# Patient Record
Sex: Male | Born: 1945
Health system: Southern US, Community
[De-identification: ages and names within clinical notes are randomized; demographics above are authoritative.]

## PROBLEM LIST (undated history)

## (undated) DIAGNOSIS — I10 Essential (primary) hypertension: Secondary | ICD-10-CM

## (undated) DIAGNOSIS — I509 Heart failure, unspecified: Secondary | ICD-10-CM

## (undated) DIAGNOSIS — I440 Atrioventricular block, first degree: Secondary | ICD-10-CM

## (undated) DIAGNOSIS — Z950 Presence of cardiac pacemaker: Secondary | ICD-10-CM

## (undated) DIAGNOSIS — I4892 Unspecified atrial flutter: Secondary | ICD-10-CM

## (undated) DIAGNOSIS — N400 Enlarged prostate without lower urinary tract symptoms: Secondary | ICD-10-CM

## (undated) DIAGNOSIS — G8929 Other chronic pain: Secondary | ICD-10-CM

## (undated) DIAGNOSIS — M549 Dorsalgia, unspecified: Secondary | ICD-10-CM

## (undated) DIAGNOSIS — K922 Gastrointestinal hemorrhage, unspecified: Secondary | ICD-10-CM

## (undated) DIAGNOSIS — Z789 Other specified health status: Secondary | ICD-10-CM

## (undated) DIAGNOSIS — I38 Endocarditis, valve unspecified: Secondary | ICD-10-CM

## (undated) DIAGNOSIS — N183 Chronic kidney disease, stage 3 unspecified: Secondary | ICD-10-CM

## (undated) DIAGNOSIS — F1090 Alcohol use, unspecified, uncomplicated: Secondary | ICD-10-CM

## (undated) DIAGNOSIS — Z8719 Personal history of other diseases of the digestive system: Secondary | ICD-10-CM

## (undated) DIAGNOSIS — F1910 Other psychoactive substance abuse, uncomplicated: Secondary | ICD-10-CM

## (undated) DIAGNOSIS — I4891 Unspecified atrial fibrillation: Secondary | ICD-10-CM

## (undated) DIAGNOSIS — K219 Gastro-esophageal reflux disease without esophagitis: Secondary | ICD-10-CM

## (undated) DIAGNOSIS — I330 Acute and subacute infective endocarditis: Secondary | ICD-10-CM

## (undated) HISTORY — DX: Heart failure, unspecified: I50.9

## (undated) HISTORY — DX: Other chronic pain: G89.29

## (undated) HISTORY — DX: Gastro-esophageal reflux disease without esophagitis: K21.9

## (undated) HISTORY — PX: PACEMAKER INSERTION: SHX728

## (undated) HISTORY — DX: Unspecified atrial flutter: I48.92

## (undated) HISTORY — DX: Essential (primary) hypertension: I10

## (undated) HISTORY — DX: Dorsalgia, unspecified: M54.9

## (undated) HISTORY — DX: Acute and subacute infective endocarditis: I33.0

## (undated) HISTORY — DX: Atrioventricular block, first degree: I44.0

## (undated) HISTORY — DX: Unspecified atrial fibrillation: I48.91

## (undated) HISTORY — DX: Presence of cardiac pacemaker: Z95.0

## (undated) HISTORY — DX: Alcohol use, unspecified, uncomplicated: F10.90

## (undated) HISTORY — DX: Chronic kidney disease, stage 3 unspecified: N18.30

## (undated) HISTORY — DX: Other specified health status: Z78.9

## (undated) HISTORY — DX: Other psychoactive substance abuse, uncomplicated: F19.10

## (undated) HISTORY — DX: Personal history of other diseases of the digestive system: Z87.19

## (undated) HISTORY — DX: Chronic kidney disease, stage 3 (moderate): N18.3

## (undated) HISTORY — PX: CARDIAC VALVE REPLACEMENT: SHX585

---

## 1998-02-05 ENCOUNTER — Emergency Department (HOSPITAL_COMMUNITY): Admission: EM | Admit: 1998-02-05 | Discharge: 1998-02-05 | Payer: Self-pay | Admitting: Emergency Medicine

## 1998-03-01 ENCOUNTER — Emergency Department (HOSPITAL_COMMUNITY): Admission: EM | Admit: 1998-03-01 | Discharge: 1998-03-01 | Payer: Self-pay | Admitting: Emergency Medicine

## 2002-03-29 ENCOUNTER — Inpatient Hospital Stay (HOSPITAL_COMMUNITY): Admission: EM | Admit: 2002-03-29 | Discharge: 2002-03-29 | Payer: Self-pay | Admitting: *Deleted

## 2006-04-26 ENCOUNTER — Emergency Department (HOSPITAL_COMMUNITY): Admission: EM | Admit: 2006-04-26 | Discharge: 2006-04-26 | Payer: Self-pay | Admitting: Family Medicine

## 2007-06-03 ENCOUNTER — Emergency Department (HOSPITAL_COMMUNITY): Admission: EM | Admit: 2007-06-03 | Discharge: 2007-06-03 | Payer: Self-pay | Admitting: Emergency Medicine

## 2007-06-06 ENCOUNTER — Emergency Department (HOSPITAL_COMMUNITY): Admission: EM | Admit: 2007-06-06 | Discharge: 2007-06-06 | Payer: Self-pay | Admitting: Emergency Medicine

## 2007-06-12 ENCOUNTER — Emergency Department (HOSPITAL_COMMUNITY): Admission: EM | Admit: 2007-06-12 | Discharge: 2007-06-12 | Payer: Self-pay | Admitting: Emergency Medicine

## 2007-06-15 ENCOUNTER — Emergency Department (HOSPITAL_COMMUNITY): Admission: EM | Admit: 2007-06-15 | Discharge: 2007-06-15 | Payer: Self-pay | Admitting: Emergency Medicine

## 2007-09-06 ENCOUNTER — Emergency Department (HOSPITAL_COMMUNITY): Admission: EM | Admit: 2007-09-06 | Discharge: 2007-09-06 | Payer: Self-pay | Admitting: Family Medicine

## 2007-09-09 ENCOUNTER — Emergency Department (HOSPITAL_COMMUNITY): Admission: EM | Admit: 2007-09-09 | Discharge: 2007-09-09 | Payer: Self-pay | Admitting: Family Medicine

## 2007-09-30 ENCOUNTER — Inpatient Hospital Stay (HOSPITAL_COMMUNITY): Admission: EM | Admit: 2007-09-30 | Discharge: 2007-10-03 | Payer: Self-pay | Admitting: Emergency Medicine

## 2007-10-03 ENCOUNTER — Encounter (INDEPENDENT_AMBULATORY_CARE_PROVIDER_SITE_OTHER): Payer: Self-pay | Admitting: *Deleted

## 2007-10-07 ENCOUNTER — Ambulatory Visit: Payer: Self-pay | Admitting: Internal Medicine

## 2007-10-07 LAB — CONVERTED CEMR LAB
Basophils Relative: 1 % (ref 0–1)
Eosinophils Absolute: 0.1 10*3/uL (ref 0.0–0.7)
Eosinophils Relative: 1 % (ref 0–5)
Hemoglobin: 10 g/dL — ABNORMAL LOW (ref 13.0–17.0)
MCHC: 29.9 g/dL — ABNORMAL LOW (ref 30.0–36.0)
MCV: 82.9 fL (ref 78.0–100.0)
Monocytes Absolute: 0.7 10*3/uL (ref 0.1–1.0)
Monocytes Relative: 8 % (ref 3–12)
Neutrophils Relative %: 64 % (ref 43–77)
RBC: 4.03 M/uL — ABNORMAL LOW (ref 4.22–5.81)

## 2007-10-13 ENCOUNTER — Encounter (INDEPENDENT_AMBULATORY_CARE_PROVIDER_SITE_OTHER): Payer: Self-pay | Admitting: Internal Medicine

## 2007-10-13 LAB — CONVERTED CEMR LAB
Ferritin: 436 ng/mL — ABNORMAL HIGH (ref 22–322)
Saturation Ratios: 6 % — ABNORMAL LOW (ref 20–55)

## 2007-10-17 ENCOUNTER — Emergency Department (HOSPITAL_COMMUNITY): Admission: EM | Admit: 2007-10-17 | Discharge: 2007-10-17 | Payer: Self-pay | Admitting: Emergency Medicine

## 2007-11-01 ENCOUNTER — Ambulatory Visit: Payer: Self-pay | Admitting: Internal Medicine

## 2007-11-01 LAB — CONVERTED CEMR LAB
Hemoglobin: 8.7 g/dL — ABNORMAL LOW (ref 13.0–17.0)
Lymphocytes Relative: 22 % (ref 12–46)
Monocytes Absolute: 0.7 10*3/uL (ref 0.1–1.0)
Monocytes Relative: 7 % (ref 3–12)
Neutro Abs: 6.8 10*3/uL (ref 1.7–7.7)
RBC: 3.61 M/uL — ABNORMAL LOW (ref 4.22–5.81)
WBC: 9.8 10*3/uL (ref 4.0–10.5)

## 2007-11-02 ENCOUNTER — Ambulatory Visit (HOSPITAL_COMMUNITY): Admission: RE | Admit: 2007-11-02 | Discharge: 2007-11-02 | Payer: Self-pay | Admitting: Internal Medicine

## 2007-11-09 ENCOUNTER — Emergency Department (HOSPITAL_COMMUNITY): Admission: EM | Admit: 2007-11-09 | Discharge: 2007-11-09 | Payer: Self-pay | Admitting: Emergency Medicine

## 2007-11-11 ENCOUNTER — Ambulatory Visit: Payer: Self-pay | Admitting: Cardiology

## 2007-11-11 LAB — CONVERTED CEMR LAB
BUN: 11 mg/dL (ref 6–23)
Chloride: 96 meq/L (ref 96–112)
GFR calc non Af Amer: 80 mL/min
Glucose, Bld: 107 mg/dL — ABNORMAL HIGH (ref 70–99)
Potassium: 4 meq/L (ref 3.5–5.1)
Sodium: 134 meq/L — ABNORMAL LOW (ref 135–145)

## 2007-11-16 ENCOUNTER — Ambulatory Visit: Payer: Self-pay | Admitting: Internal Medicine

## 2007-11-16 ENCOUNTER — Ambulatory Visit (HOSPITAL_COMMUNITY): Admission: RE | Admit: 2007-11-16 | Discharge: 2007-11-16 | Payer: Self-pay | Admitting: Internal Medicine

## 2007-11-16 LAB — CONVERTED CEMR LAB
Albumin ELP: 44.3 % — ABNORMAL LOW (ref 55.8–66.1)
Alpha-1-Globulin: 9 % — ABNORMAL HIGH (ref 2.9–4.9)
Alpha-2-Globulin: 13.5 % — ABNORMAL HIGH (ref 7.1–11.8)
Basophils Relative: 0 % (ref 0–1)
Beta Globulin: 5.6 % (ref 4.7–7.2)
Hemoglobin: 9.5 g/dL — ABNORMAL LOW (ref 13.0–17.0)
Lymphs Abs: 2 10*3/uL (ref 0.7–4.0)
MCHC: 29.7 g/dL — ABNORMAL LOW (ref 30.0–36.0)
MCV: 81 fL (ref 78.0–100.0)
Monocytes Absolute: 0.9 10*3/uL (ref 0.1–1.0)
Monocytes Relative: 9 % (ref 3–12)
Neutro Abs: 6.1 10*3/uL (ref 1.7–7.7)
PSA: 1.21 ng/mL (ref 0.10–4.00)
RBC: 3.95 M/uL — ABNORMAL LOW (ref 4.22–5.81)
Total Protein, Serum Electrophoresis: 7.8 g/dL (ref 6.0–8.3)
WBC: 9.1 10*3/uL (ref 4.0–10.5)

## 2007-11-17 ENCOUNTER — Ambulatory Visit: Payer: Self-pay | Admitting: *Deleted

## 2007-11-18 ENCOUNTER — Encounter (INDEPENDENT_AMBULATORY_CARE_PROVIDER_SITE_OTHER): Payer: Self-pay | Admitting: Internal Medicine

## 2007-11-18 LAB — CONVERTED CEMR LAB
Albumin, U: DETECTED %
Free Kappa Lt Chains,Ur: 2.28 mg/dL — ABNORMAL HIGH (ref 0.04–1.51)
Volume, Urine: 1500 mL

## 2007-12-14 ENCOUNTER — Inpatient Hospital Stay (HOSPITAL_COMMUNITY): Admission: EM | Admit: 2007-12-14 | Discharge: 2008-01-24 | Payer: Self-pay | Admitting: Emergency Medicine

## 2007-12-14 ENCOUNTER — Encounter (INDEPENDENT_AMBULATORY_CARE_PROVIDER_SITE_OTHER): Payer: Self-pay | Admitting: Emergency Medicine

## 2007-12-14 ENCOUNTER — Ambulatory Visit: Payer: Self-pay | Admitting: Cardiology

## 2007-12-16 ENCOUNTER — Ambulatory Visit: Payer: Self-pay | Admitting: Cardiology

## 2007-12-16 ENCOUNTER — Ambulatory Visit: Payer: Self-pay | Admitting: Thoracic Surgery (Cardiothoracic Vascular Surgery)

## 2007-12-16 ENCOUNTER — Encounter (INDEPENDENT_AMBULATORY_CARE_PROVIDER_SITE_OTHER): Payer: Self-pay | Admitting: Internal Medicine

## 2007-12-16 ENCOUNTER — Encounter: Payer: Self-pay | Admitting: Thoracic Surgery (Cardiothoracic Vascular Surgery)

## 2007-12-17 ENCOUNTER — Ambulatory Visit: Payer: Self-pay | Admitting: Infectious Diseases

## 2007-12-17 ENCOUNTER — Encounter: Payer: Self-pay | Admitting: Internal Medicine

## 2007-12-19 ENCOUNTER — Encounter: Payer: Self-pay | Admitting: Thoracic Surgery (Cardiothoracic Vascular Surgery)

## 2007-12-19 ENCOUNTER — Encounter (INDEPENDENT_AMBULATORY_CARE_PROVIDER_SITE_OTHER): Payer: Self-pay | Admitting: Internal Medicine

## 2007-12-20 ENCOUNTER — Ambulatory Visit: Payer: Self-pay | Admitting: Dentistry

## 2007-12-26 ENCOUNTER — Ambulatory Visit: Payer: Self-pay | Admitting: Oncology

## 2007-12-27 ENCOUNTER — Encounter: Payer: Self-pay | Admitting: Thoracic Surgery (Cardiothoracic Vascular Surgery)

## 2008-01-11 ENCOUNTER — Encounter: Payer: Self-pay | Admitting: Thoracic Surgery (Cardiothoracic Vascular Surgery)

## 2008-01-16 ENCOUNTER — Ambulatory Visit: Payer: Self-pay | Admitting: Cardiothoracic Surgery

## 2008-02-01 ENCOUNTER — Ambulatory Visit: Payer: Self-pay | Admitting: Cardiology

## 2008-02-01 LAB — CONVERTED CEMR LAB
BUN: 45 mg/dL — ABNORMAL HIGH (ref 6–23)
CO2: 30 meq/L (ref 19–32)
Calcium: 9.7 mg/dL (ref 8.4–10.5)
Creatinine, Ser: 3.1 mg/dL — ABNORMAL HIGH (ref 0.4–1.5)
GFR calc Af Amer: 26 mL/min
Glucose, Bld: 92 mg/dL (ref 70–99)

## 2008-02-06 ENCOUNTER — Ambulatory Visit: Payer: Self-pay | Admitting: Internal Medicine

## 2008-02-06 LAB — CONVERTED CEMR LAB
BUN: 41 mg/dL — ABNORMAL HIGH (ref 6–23)
CO2: 30 meq/L (ref 19–32)
Calcium: 9.5 mg/dL (ref 8.4–10.5)
GFR calc Af Amer: 27 mL/min
Glucose, Bld: 116 mg/dL — ABNORMAL HIGH (ref 70–99)
Sodium: 133 meq/L — ABNORMAL LOW (ref 135–145)

## 2008-02-07 ENCOUNTER — Ambulatory Visit: Payer: Self-pay | Admitting: Cardiology

## 2008-02-10 ENCOUNTER — Inpatient Hospital Stay (HOSPITAL_COMMUNITY): Admission: EM | Admit: 2008-02-10 | Discharge: 2008-03-03 | Payer: Self-pay | Admitting: Emergency Medicine

## 2008-02-10 ENCOUNTER — Ambulatory Visit: Payer: Self-pay | Admitting: Cardiovascular Disease

## 2008-02-14 ENCOUNTER — Encounter: Payer: Self-pay | Admitting: Cardiovascular Disease

## 2008-02-22 ENCOUNTER — Ambulatory Visit: Payer: Self-pay | Admitting: Gastroenterology

## 2008-02-23 ENCOUNTER — Encounter: Payer: Self-pay | Admitting: Gastroenterology

## 2008-02-24 ENCOUNTER — Encounter: Payer: Self-pay | Admitting: Gastroenterology

## 2008-02-27 ENCOUNTER — Encounter: Payer: Self-pay | Admitting: Gastroenterology

## 2008-03-05 ENCOUNTER — Ambulatory Visit: Payer: Self-pay | Admitting: Internal Medicine

## 2008-03-05 LAB — CONVERTED CEMR LAB
Basophils Absolute: 0 10*3/uL (ref 0.0–0.1)
Basophils Relative: 1 % (ref 0–1)
CO2: 22 meq/L (ref 19–32)
Calcium: 10 mg/dL (ref 8.4–10.5)
Creatinine, Ser: 1.67 mg/dL — ABNORMAL HIGH (ref 0.40–1.50)
Eosinophils Absolute: 0.1 10*3/uL (ref 0.0–0.7)
Glucose, Bld: 90 mg/dL (ref 70–99)
INR: 1.3 (ref 0.0–1.5)
MCHC: 30.8 g/dL (ref 30.0–36.0)
MCV: 91.7 fL (ref 78.0–100.0)
Monocytes Absolute: 0.8 10*3/uL (ref 0.1–1.0)
Monocytes Relative: 10 % (ref 3–12)
Neutro Abs: 5.4 10*3/uL (ref 1.7–7.7)
Neutrophils Relative %: 66 % (ref 43–77)
RBC: 4.24 M/uL (ref 4.22–5.81)
RDW: 18.5 % — ABNORMAL HIGH (ref 11.5–15.5)
Sodium: 140 meq/L (ref 135–145)

## 2008-03-08 ENCOUNTER — Telehealth: Payer: Self-pay | Admitting: Gastroenterology

## 2008-03-08 ENCOUNTER — Ambulatory Visit: Payer: Self-pay | Admitting: Internal Medicine

## 2008-03-13 ENCOUNTER — Ambulatory Visit: Payer: Self-pay | Admitting: Gastroenterology

## 2008-03-13 DIAGNOSIS — Z860100 Personal history of colon polyps, unspecified: Secondary | ICD-10-CM | POA: Insufficient documentation

## 2008-03-13 DIAGNOSIS — Z8679 Personal history of other diseases of the circulatory system: Secondary | ICD-10-CM | POA: Insufficient documentation

## 2008-03-13 DIAGNOSIS — D649 Anemia, unspecified: Secondary | ICD-10-CM

## 2008-03-13 DIAGNOSIS — R0602 Shortness of breath: Secondary | ICD-10-CM

## 2008-03-13 DIAGNOSIS — R609 Edema, unspecified: Secondary | ICD-10-CM

## 2008-03-13 DIAGNOSIS — I38 Endocarditis, valve unspecified: Secondary | ICD-10-CM

## 2008-03-13 DIAGNOSIS — R42 Dizziness and giddiness: Secondary | ICD-10-CM

## 2008-03-13 DIAGNOSIS — I499 Cardiac arrhythmia, unspecified: Secondary | ICD-10-CM

## 2008-03-13 HISTORY — DX: Personal history of other diseases of the circulatory system: Z86.79

## 2008-03-14 LAB — CONVERTED CEMR LAB
Eosinophils Relative: 1.4 % (ref 0.0–5.0)
Lymphocytes Relative: 26.1 % (ref 12.0–46.0)
Monocytes Relative: 9.1 % (ref 3.0–12.0)
Platelets: 369 10*3/uL (ref 150–400)
RDW: 16.8 % — ABNORMAL HIGH (ref 11.5–14.6)
WBC: 10.3 10*3/uL (ref 4.5–10.5)

## 2008-03-15 ENCOUNTER — Ambulatory Visit: Payer: Self-pay | Admitting: Internal Medicine

## 2008-03-15 LAB — CONVERTED CEMR LAB
INR: 1.4 (ref 0.0–1.5)
Prothrombin Time: 17.6 s — ABNORMAL HIGH (ref 11.6–15.2)

## 2008-03-19 ENCOUNTER — Ambulatory Visit: Payer: Self-pay | Admitting: Internal Medicine

## 2008-03-21 ENCOUNTER — Ambulatory Visit: Payer: Self-pay | Admitting: Cardiovascular Disease

## 2008-03-21 ENCOUNTER — Ambulatory Visit: Payer: Self-pay | Admitting: Internal Medicine

## 2008-03-21 LAB — CONVERTED CEMR LAB
INR: 1.9 — ABNORMAL HIGH (ref 0.0–1.5)
Prothrombin Time: 23.1 s — ABNORMAL HIGH (ref 11.6–15.2)

## 2008-03-28 ENCOUNTER — Ambulatory Visit: Payer: Self-pay | Admitting: Internal Medicine

## 2008-03-28 LAB — CONVERTED CEMR LAB
INR: 2.9 — ABNORMAL HIGH (ref 0.0–1.5)
Prothrombin Time: 32.1 s — ABNORMAL HIGH (ref 11.6–15.2)

## 2008-04-03 ENCOUNTER — Ambulatory Visit: Payer: Self-pay | Admitting: Internal Medicine

## 2008-04-10 ENCOUNTER — Ambulatory Visit: Payer: Self-pay | Admitting: Internal Medicine

## 2008-04-11 ENCOUNTER — Ambulatory Visit: Payer: Self-pay | Admitting: Internal Medicine

## 2008-04-11 LAB — CONVERTED CEMR LAB
INR: 2.2 — ABNORMAL HIGH (ref 0.0–1.5)
Prothrombin Time: 25.8 s — ABNORMAL HIGH (ref 11.6–15.2)

## 2008-04-12 ENCOUNTER — Ambulatory Visit: Payer: Self-pay | Admitting: Internal Medicine

## 2008-04-12 ENCOUNTER — Ambulatory Visit: Payer: Self-pay | Admitting: Cardiology

## 2008-04-18 ENCOUNTER — Encounter: Payer: Self-pay | Admitting: Internal Medicine

## 2008-04-18 ENCOUNTER — Ambulatory Visit: Payer: Self-pay | Admitting: Internal Medicine

## 2008-04-18 ENCOUNTER — Ambulatory Visit (HOSPITAL_COMMUNITY): Admission: RE | Admit: 2008-04-18 | Discharge: 2008-04-18 | Payer: Self-pay | Admitting: Internal Medicine

## 2008-04-25 ENCOUNTER — Ambulatory Visit: Payer: Self-pay | Admitting: Internal Medicine

## 2008-04-25 LAB — CONVERTED CEMR LAB: Prothrombin Time: 30.7 s — ABNORMAL HIGH (ref 11.6–15.2)

## 2008-05-10 ENCOUNTER — Ambulatory Visit: Payer: Self-pay | Admitting: Internal Medicine

## 2008-05-10 LAB — CONVERTED CEMR LAB: INR: 4.2 — ABNORMAL HIGH (ref 0.0–1.5)

## 2008-05-14 ENCOUNTER — Ambulatory Visit: Payer: Self-pay | Admitting: Cardiology

## 2008-05-22 ENCOUNTER — Ambulatory Visit: Payer: Self-pay | Admitting: Family Medicine

## 2008-05-22 ENCOUNTER — Ambulatory Visit: Payer: Self-pay | Admitting: Internal Medicine

## 2008-05-22 ENCOUNTER — Encounter (INDEPENDENT_AMBULATORY_CARE_PROVIDER_SITE_OTHER): Payer: Self-pay | Admitting: Internal Medicine

## 2008-05-22 LAB — CONVERTED CEMR LAB
CO2: 29 meq/L (ref 19–32)
Calcium: 9 mg/dL (ref 8.4–10.5)
Creatinine, Ser: 2 mg/dL — ABNORMAL HIGH (ref 0.4–1.5)
Free T4: 0.5 ng/dL — ABNORMAL LOW (ref 0.6–1.6)
GFR calc Af Amer: 43 mL/min
INR: 2.2 — ABNORMAL HIGH (ref 0.8–1.0)
INR: 2.4 — ABNORMAL HIGH (ref 0.0–1.5)
Prothrombin Time: 23.6 s — ABNORMAL HIGH (ref 10.9–13.3)
Prothrombin Time: 27.8 s — ABNORMAL HIGH (ref 11.6–15.2)

## 2008-06-05 ENCOUNTER — Ambulatory Visit: Payer: Self-pay | Admitting: Internal Medicine

## 2008-06-05 LAB — CONVERTED CEMR LAB
Chloride: 103 meq/L (ref 96–112)
Eosinophils Relative: 4 % (ref 0–5)
Ferritin: 1449 ng/mL — ABNORMAL HIGH (ref 22–322)
HCT: 28.4 % — ABNORMAL LOW (ref 39.0–52.0)
INR: 2.2 — ABNORMAL HIGH (ref 0.0–1.5)
Iron: 76 ug/dL (ref 42–165)
Lymphocytes Relative: 36 % (ref 12–46)
Lymphs Abs: 2.1 10*3/uL (ref 0.7–4.0)
Neutrophils Relative %: 48 % (ref 43–77)
Phosphorus: 3.5 mg/dL (ref 2.3–4.6)
Platelets: 415 10*3/uL — ABNORMAL HIGH (ref 150–400)
Potassium: 4.4 meq/L (ref 3.5–5.3)
Prothrombin Time: 26 s — ABNORMAL HIGH (ref 11.6–15.2)
RBC: 3.01 M/uL — ABNORMAL LOW (ref 4.22–5.81)
Saturation Ratios: 27 % (ref 20–55)
WBC: 5.7 10*3/uL (ref 4.0–10.5)

## 2008-06-14 ENCOUNTER — Ambulatory Visit: Payer: Self-pay | Admitting: Internal Medicine

## 2008-06-15 ENCOUNTER — Ambulatory Visit: Payer: Self-pay | Admitting: Gastroenterology

## 2008-06-15 DIAGNOSIS — R1033 Periumbilical pain: Secondary | ICD-10-CM | POA: Insufficient documentation

## 2008-06-25 ENCOUNTER — Encounter: Payer: Self-pay | Admitting: Gastroenterology

## 2008-06-26 ENCOUNTER — Ambulatory Visit: Payer: Self-pay | Admitting: Internal Medicine

## 2008-06-26 LAB — CONVERTED CEMR LAB
ALT: 22 units/L (ref 0–53)
AST: 61 units/L — ABNORMAL HIGH (ref 0–37)
BUN: 23 mg/dL (ref 6–23)
Basophils Absolute: 0.1 10*3/uL (ref 0.0–0.1)
Basophils Relative: 1 % (ref 0–1)
CO2: 18 meq/L — ABNORMAL LOW (ref 19–32)
Calcium, Total (PTH): 8.9 mg/dL (ref 8.4–10.5)
Creatinine, Ser: 1.92 mg/dL — ABNORMAL HIGH (ref 0.40–1.50)
Eosinophils Relative: 2 % (ref 0–5)
HCT: 32 % — ABNORMAL LOW (ref 39.0–52.0)
Hemoglobin: 9.8 g/dL — ABNORMAL LOW (ref 13.0–17.0)
Ketones, ur: NEGATIVE mg/dL
Leukocytes, UA: NEGATIVE
MCHC: 30.6 g/dL (ref 30.0–36.0)
Monocytes Absolute: 0.5 10*3/uL (ref 0.1–1.0)
Monocytes Relative: 9 % (ref 3–12)
Nitrite: NEGATIVE
PTH: 100.1 pg/mL — ABNORMAL HIGH (ref 14.0–72.0)
Phosphorus: 3.5 mg/dL (ref 2.3–4.6)
Protein, ur: NEGATIVE mg/dL
RDW: 18.9 % — ABNORMAL HIGH (ref 11.5–15.5)
T3, Free: 2.2 pg/mL — ABNORMAL LOW (ref 2.3–4.2)
TSH: 0.57 microintl units/mL (ref 0.35–5.50)
Total Bilirubin: 1.1 mg/dL (ref 0.3–1.2)
Urobilinogen, UA: 1 (ref 0.0–1.0)
WBC, UA: NONE SEEN cells/hpf (ref <3)
pH: 6 (ref 5.0–8.0)

## 2008-07-04 ENCOUNTER — Ambulatory Visit: Payer: Self-pay | Admitting: Family Medicine

## 2008-07-04 ENCOUNTER — Encounter (INDEPENDENT_AMBULATORY_CARE_PROVIDER_SITE_OTHER): Payer: Self-pay | Admitting: Adult Health

## 2008-07-04 LAB — CONVERTED CEMR LAB
AST: 71 units/L — ABNORMAL HIGH (ref 0–37)
Albumin: 4.7 g/dL (ref 3.5–5.2)
Alkaline Phosphatase: 50 units/L (ref 39–117)
BUN: 23 mg/dL (ref 6–23)
Basophils Relative: 1 % (ref 0–1)
Bilirubin Urine: NEGATIVE
Creatinine, Ser: 2.02 mg/dL — ABNORMAL HIGH (ref 0.40–1.50)
Eosinophils Absolute: 0.1 10*3/uL (ref 0.0–0.7)
Eosinophils Relative: 2 % (ref 0–5)
Glucose, Bld: 70 mg/dL (ref 70–99)
HCT: 30 % — ABNORMAL LOW (ref 39.0–52.0)
Hemoglobin: 9.5 g/dL — ABNORMAL LOW (ref 13.0–17.0)
Leukocytes, UA: NEGATIVE
Lymphs Abs: 1.9 10*3/uL (ref 0.7–4.0)
MCHC: 31.7 g/dL (ref 30.0–36.0)
MCV: 93.5 fL (ref 78.0–100.0)
Monocytes Absolute: 0.6 10*3/uL (ref 0.1–1.0)
Monocytes Relative: 12 % (ref 3–12)
Potassium: 4.3 meq/L (ref 3.5–5.3)
Protein, ur: 30 mg/dL — AB
RBC / HPF: NONE SEEN (ref <3)
RBC: 3.21 M/uL — ABNORMAL LOW (ref 4.22–5.81)
Specific Gravity, Urine: 1.011 (ref 1.005–1.03)
Urine Glucose: NEGATIVE mg/dL
Urobilinogen, UA: 1 (ref 0.0–1.0)
WBC: 5.3 10*3/uL (ref 4.0–10.5)

## 2008-07-05 ENCOUNTER — Encounter (INDEPENDENT_AMBULATORY_CARE_PROVIDER_SITE_OTHER): Payer: Self-pay | Admitting: Adult Health

## 2008-07-06 DIAGNOSIS — Z950 Presence of cardiac pacemaker: Secondary | ICD-10-CM

## 2008-07-06 HISTORY — DX: Presence of cardiac pacemaker: Z95.0

## 2008-07-11 ENCOUNTER — Ambulatory Visit: Payer: Self-pay | Admitting: Adult Health

## 2008-07-11 ENCOUNTER — Encounter (INDEPENDENT_AMBULATORY_CARE_PROVIDER_SITE_OTHER): Payer: Self-pay | Admitting: Internal Medicine

## 2008-07-17 ENCOUNTER — Encounter (HOSPITAL_COMMUNITY): Admission: RE | Admit: 2008-07-17 | Discharge: 2008-10-15 | Payer: Self-pay | Admitting: Nephrology

## 2008-07-30 ENCOUNTER — Ambulatory Visit: Payer: Self-pay | Admitting: Internal Medicine

## 2008-08-01 ENCOUNTER — Ambulatory Visit: Payer: Self-pay | Admitting: Cardiovascular Disease

## 2008-08-01 ENCOUNTER — Ambulatory Visit (HOSPITAL_COMMUNITY): Admission: RE | Admit: 2008-08-01 | Discharge: 2008-08-01 | Payer: Self-pay | Admitting: Internal Medicine

## 2008-08-01 ENCOUNTER — Encounter: Payer: Self-pay | Admitting: Internal Medicine

## 2008-08-08 ENCOUNTER — Ambulatory Visit: Payer: Self-pay | Admitting: Internal Medicine

## 2008-08-08 ENCOUNTER — Ambulatory Visit: Payer: Self-pay | Admitting: Adult Health

## 2008-08-08 LAB — CONVERTED CEMR LAB
AST: 75 units/L — ABNORMAL HIGH (ref 0–37)
Albumin: 4.4 g/dL (ref 3.5–5.2)
Alkaline Phosphatase: 53 units/L (ref 39–117)
BUN: 28 mg/dL — ABNORMAL HIGH (ref 6–23)
Basophils Relative: 1 % (ref 0–1)
Creatinine, Ser: 1.97 mg/dL — ABNORMAL HIGH (ref 0.40–1.50)
Eosinophils Absolute: 0.2 10*3/uL (ref 0.0–0.7)
Eosinophils Relative: 3 % (ref 0–5)
Glucose, Bld: 70 mg/dL (ref 70–99)
HCT: 27.6 % — ABNORMAL LOW (ref 39.0–52.0)
Lymphs Abs: 2.1 10*3/uL (ref 0.7–4.0)
MCHC: 30.8 g/dL (ref 30.0–36.0)
MCV: 92.6 fL (ref 78.0–100.0)
Monocytes Relative: 12 % (ref 3–12)
RBC: 2.98 M/uL — ABNORMAL LOW (ref 4.22–5.81)
Total Bilirubin: 1 mg/dL (ref 0.3–1.2)
WBC: 6.2 10*3/uL (ref 4.0–10.5)

## 2008-08-20 ENCOUNTER — Ambulatory Visit: Payer: Self-pay | Admitting: Internal Medicine

## 2008-09-03 ENCOUNTER — Ambulatory Visit: Payer: Self-pay | Admitting: Adult Health

## 2008-09-09 DIAGNOSIS — I10 Essential (primary) hypertension: Secondary | ICD-10-CM

## 2008-09-09 DIAGNOSIS — I059 Rheumatic mitral valve disease, unspecified: Secondary | ICD-10-CM

## 2008-09-09 DIAGNOSIS — E785 Hyperlipidemia, unspecified: Secondary | ICD-10-CM | POA: Insufficient documentation

## 2008-09-11 ENCOUNTER — Encounter: Payer: Self-pay | Admitting: Internal Medicine

## 2008-09-11 ENCOUNTER — Ambulatory Visit: Payer: Self-pay | Admitting: Internal Medicine

## 2008-09-11 DIAGNOSIS — R1310 Dysphagia, unspecified: Secondary | ICD-10-CM | POA: Insufficient documentation

## 2008-09-11 DIAGNOSIS — I440 Atrioventricular block, first degree: Secondary | ICD-10-CM

## 2008-09-11 DIAGNOSIS — I4892 Unspecified atrial flutter: Secondary | ICD-10-CM

## 2008-09-27 ENCOUNTER — Ambulatory Visit: Payer: Self-pay | Admitting: Adult Health

## 2008-10-12 ENCOUNTER — Ambulatory Visit (HOSPITAL_COMMUNITY): Admission: RE | Admit: 2008-10-12 | Discharge: 2008-10-12 | Payer: Self-pay | Admitting: Internal Medicine

## 2008-10-16 ENCOUNTER — Encounter (HOSPITAL_COMMUNITY): Admission: RE | Admit: 2008-10-16 | Discharge: 2009-01-14 | Payer: Self-pay | Admitting: Nephrology

## 2008-10-17 ENCOUNTER — Ambulatory Visit: Payer: Self-pay | Admitting: Gastroenterology

## 2008-10-25 ENCOUNTER — Ambulatory Visit: Payer: Self-pay | Admitting: Internal Medicine

## 2008-11-21 ENCOUNTER — Ambulatory Visit: Payer: Self-pay | Admitting: Internal Medicine

## 2008-11-21 LAB — CONVERTED CEMR LAB
Albumin: 4.5 g/dL (ref 3.5–5.2)
Alkaline Phosphatase: 61 units/L (ref 39–117)
CO2: 23 meq/L (ref 19–32)
Calcium: 9.1 mg/dL (ref 8.4–10.5)
Chloride: 108 meq/L (ref 96–112)
Glucose, Bld: 76 mg/dL (ref 70–99)
Potassium: 4.4 meq/L (ref 3.5–5.3)
Sodium: 141 meq/L (ref 135–145)
Total Protein: 7 g/dL (ref 6.0–8.3)

## 2008-11-27 ENCOUNTER — Encounter (INDEPENDENT_AMBULATORY_CARE_PROVIDER_SITE_OTHER): Payer: Self-pay | Admitting: Internal Medicine

## 2008-11-28 ENCOUNTER — Ambulatory Visit: Payer: Self-pay | Admitting: Internal Medicine

## 2008-12-05 ENCOUNTER — Ambulatory Visit: Payer: Self-pay | Admitting: Gastroenterology

## 2008-12-11 ENCOUNTER — Ambulatory Visit: Payer: Self-pay | Admitting: Internal Medicine

## 2008-12-18 ENCOUNTER — Encounter: Payer: Self-pay | Admitting: Internal Medicine

## 2008-12-18 ENCOUNTER — Ambulatory Visit: Payer: Self-pay | Admitting: Internal Medicine

## 2008-12-18 ENCOUNTER — Ambulatory Visit (HOSPITAL_COMMUNITY): Admission: RE | Admit: 2008-12-18 | Discharge: 2008-12-18 | Payer: Self-pay | Admitting: Internal Medicine

## 2008-12-19 ENCOUNTER — Ambulatory Visit: Payer: Self-pay | Admitting: Internal Medicine

## 2009-01-03 IMAGING — CR DG CHEST 1V PORT
1 series · 1 of 1 positions shown · non-contrast
Comparison: 02/10/2008

CLINICAL DATA: PICC line placement

PORTABLE CHEST - 1 VIEW

[view not recorded]
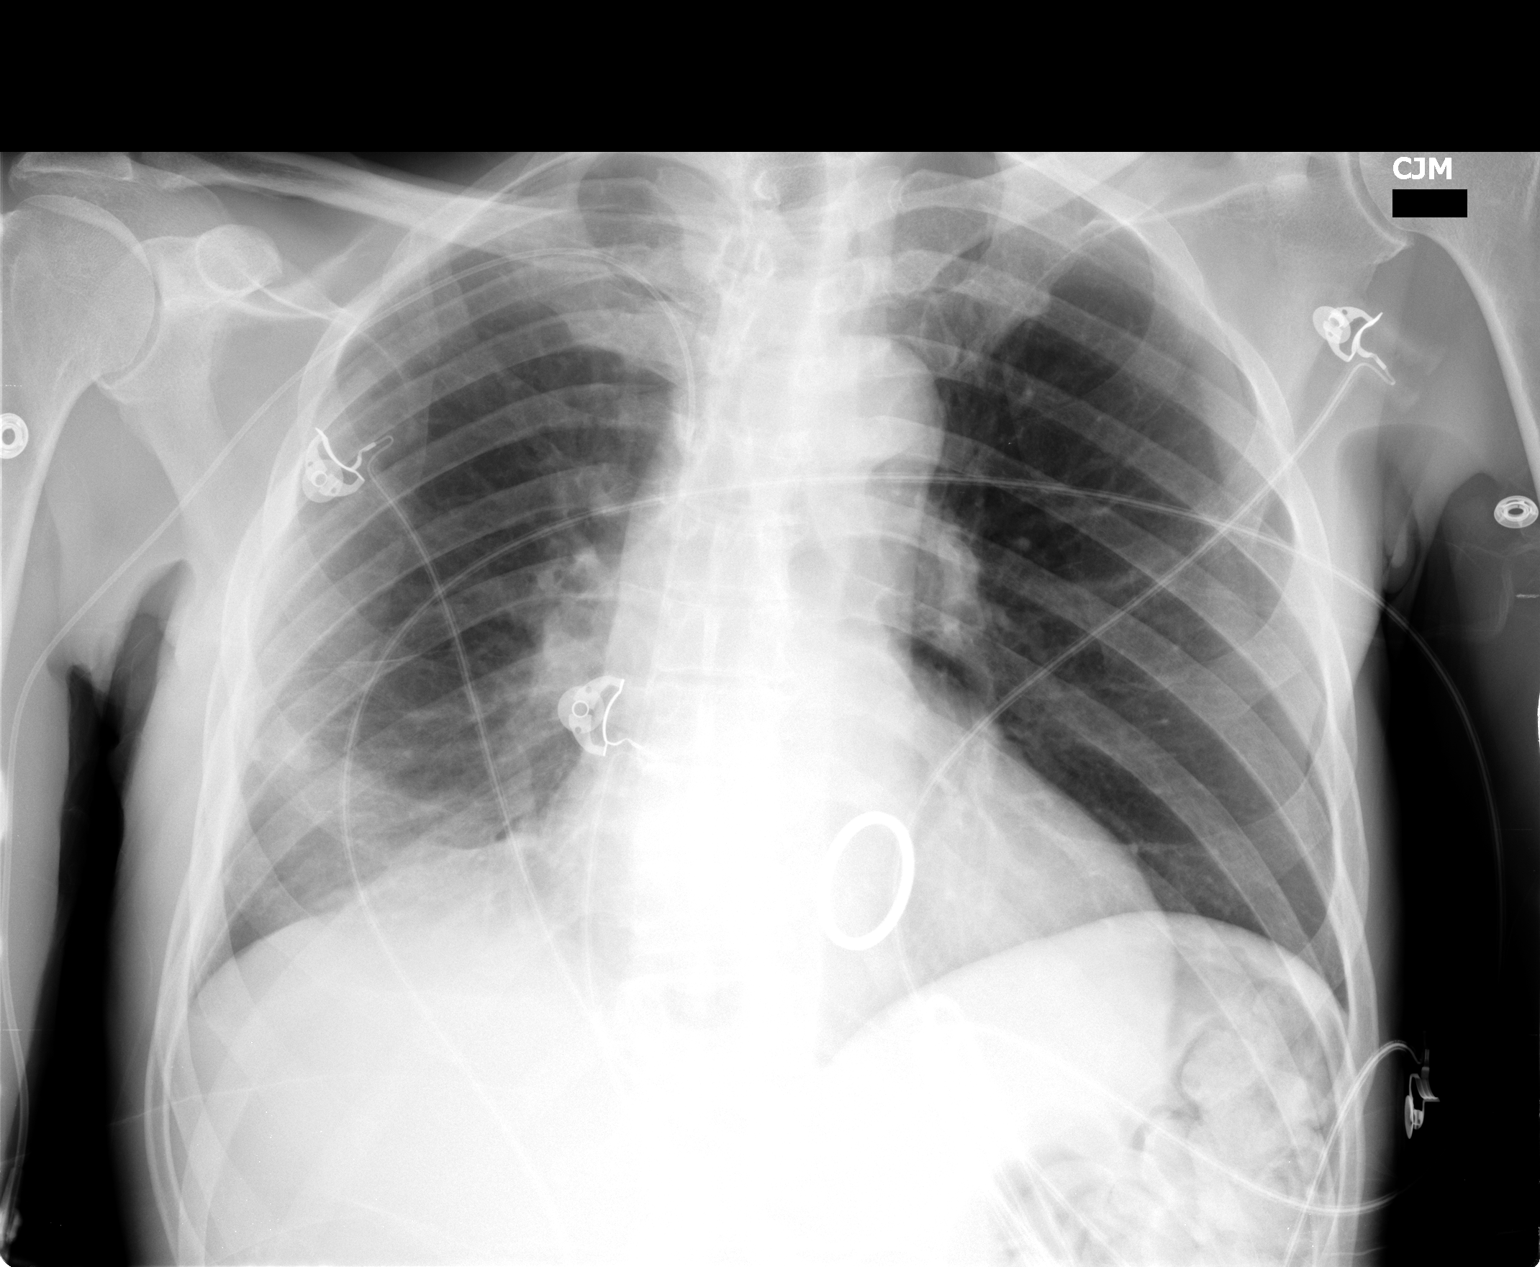

[1 of 1 positions shown; findings below may reference images not displayed]

FINDINGS: The PICC line tip is just into the right atrium.  This
should be pulled back approximately 2 cm.
IMPRESSION: The right PICC line is just into the right atrium.  This should be
pulled back approximately 2 cm.

## 2009-01-04 IMAGING — CT CT PELVIS W/O CM
2 of 4 series · 17 of 46 positions shown, 19 images · non-contrast
Comparison: 01/13/2008

CT ABDOMEN

CLINICAL DATA: Anemia.  Assess for hemorrhage.

CT ABDOMEN AND PELVIS WITHOUT CONTRAST
TECHNIQUE: Multidetector CT imaging of the abdomen and pelvis was
performed following the standard
protocol without intravenous contrast.

[Series 2: routine abdomen · axial · 0.70mm/px · z∈[-637,-247]mm · 14 of 86 slices shown, 16 images]
[im 4/86  soft-tissue]
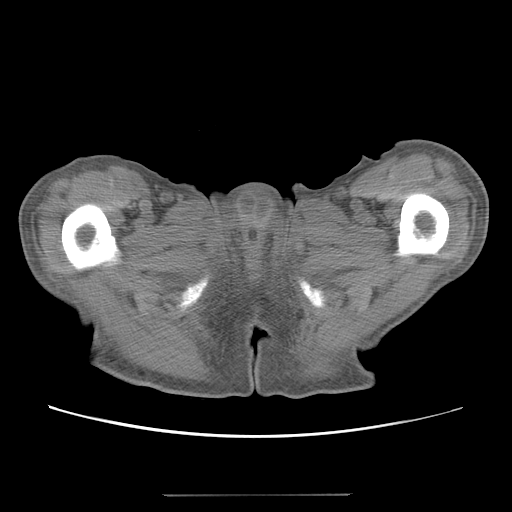
[im 4/86  bone]
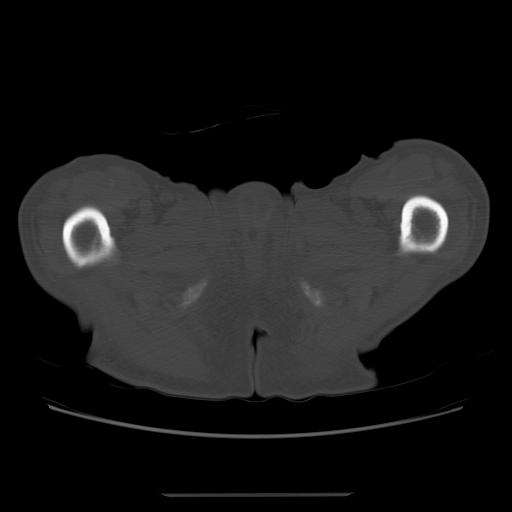
[im 12/86  soft-tissue]
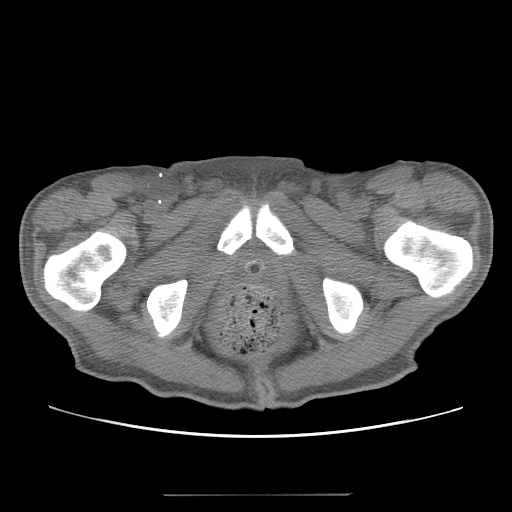
[im 15/86  soft-tissue]
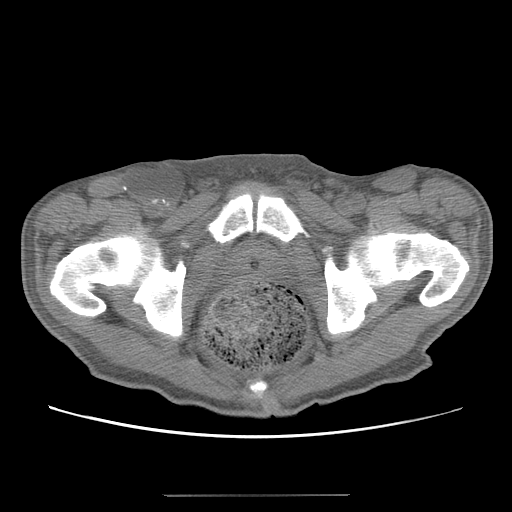
[im 23/86  soft-tissue]
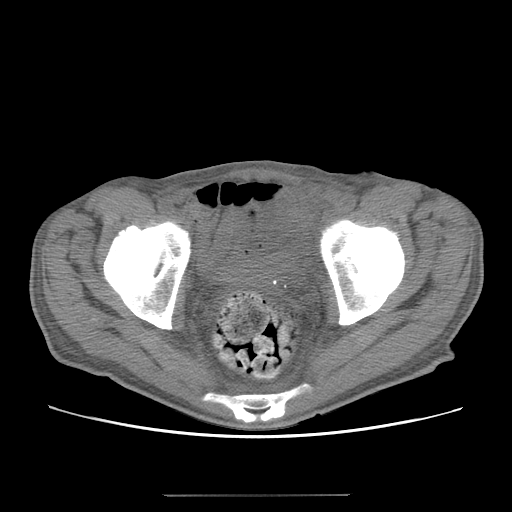
[im 30/86  soft-tissue]
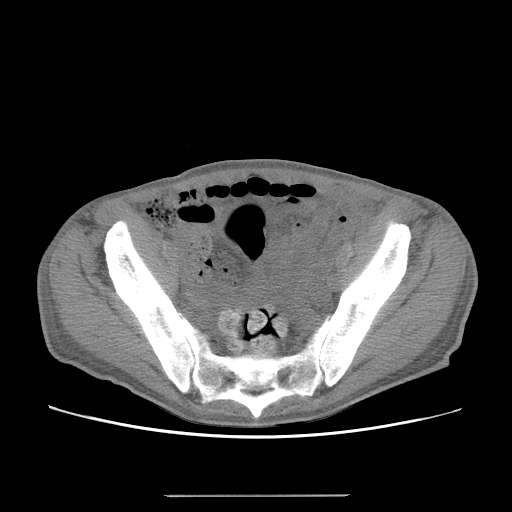
[im 34/86  soft-tissue]
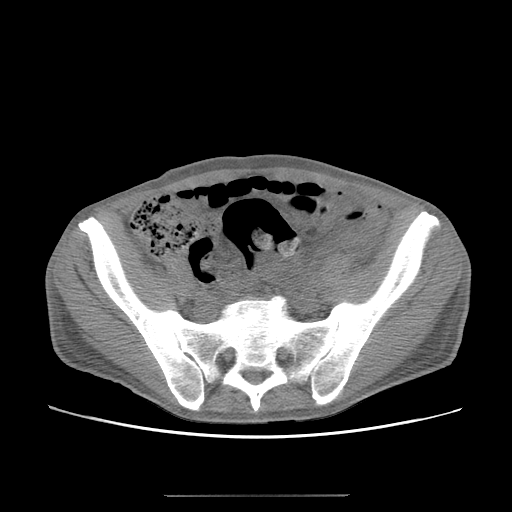
[im 41/86  soft-tissue]
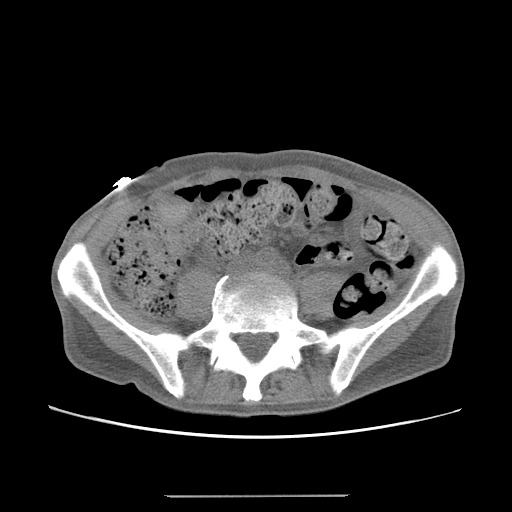
[im 45/86  soft-tissue]
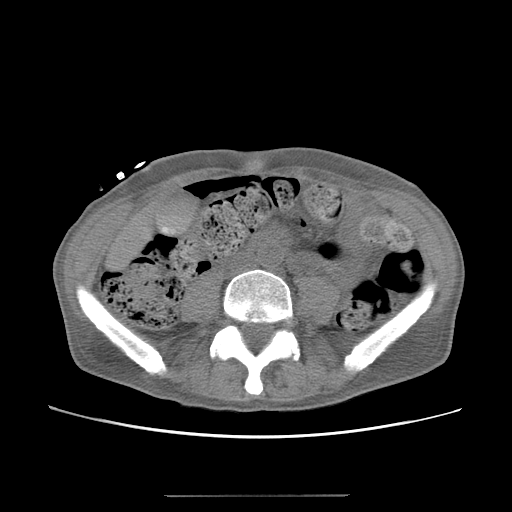
[im 52/86  soft-tissue]
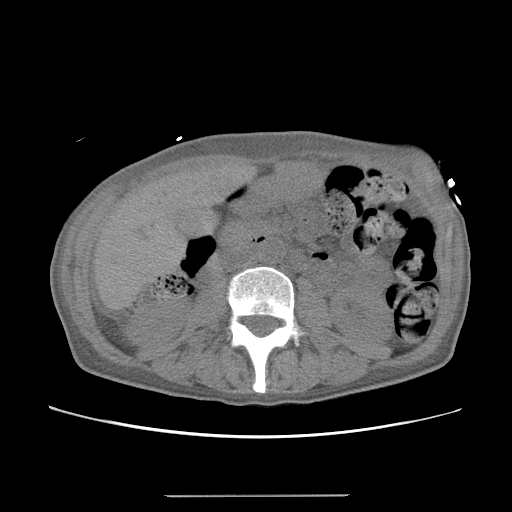
[im 52/86  bone]
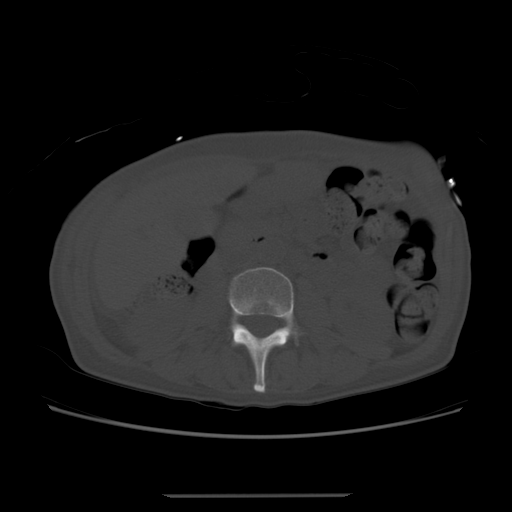
[im 56/86  soft-tissue]
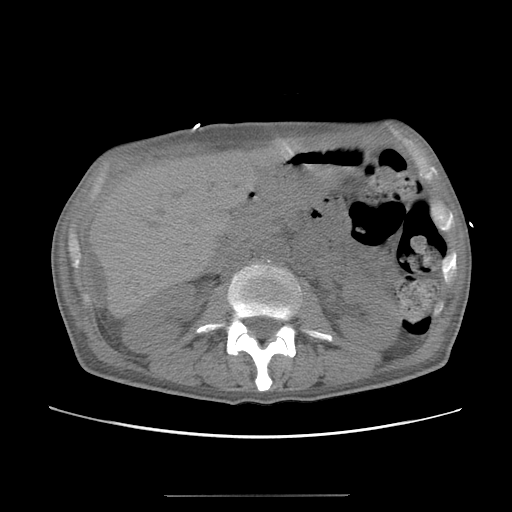
[im 63/86  soft-tissue]
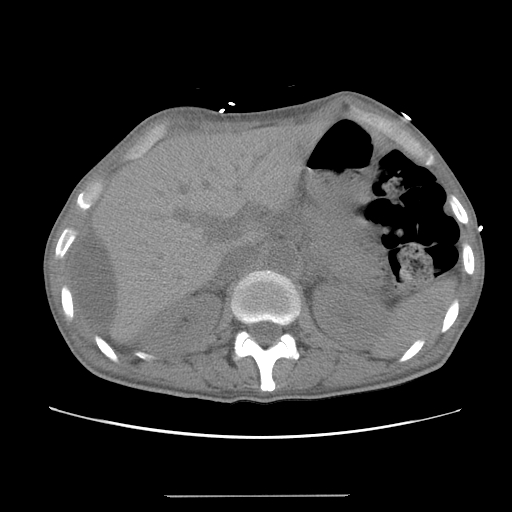
[im 71/86  soft-tissue]
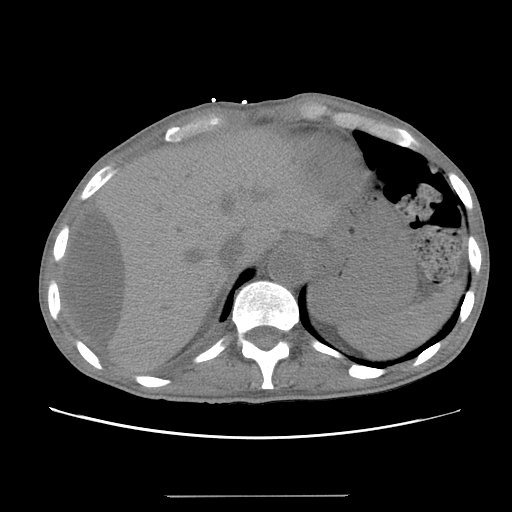
[im 74/86  soft-tissue]
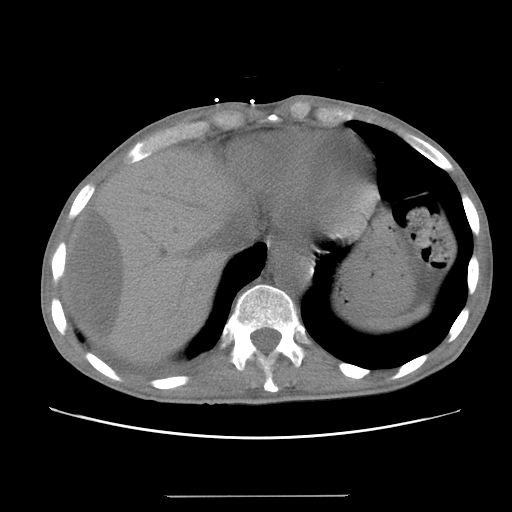
[im 82/86  soft-tissue]
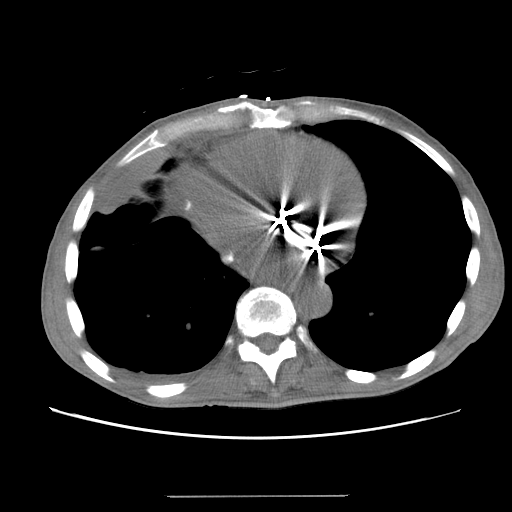

[Series 401: cor · coronal · 0.85mm/px · 3 of 77 slices shown]
[im 26/77  soft-tissue]
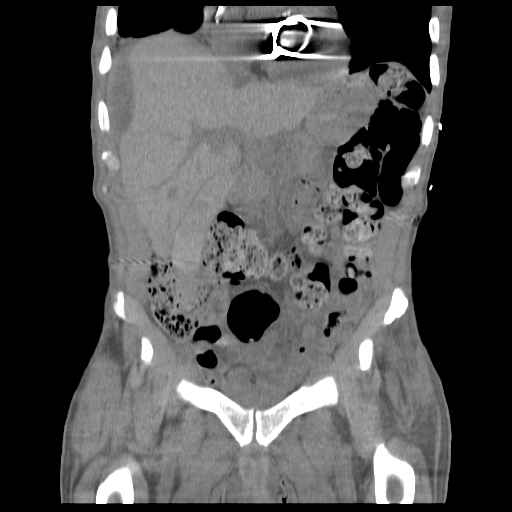
[im 34/77  soft-tissue]
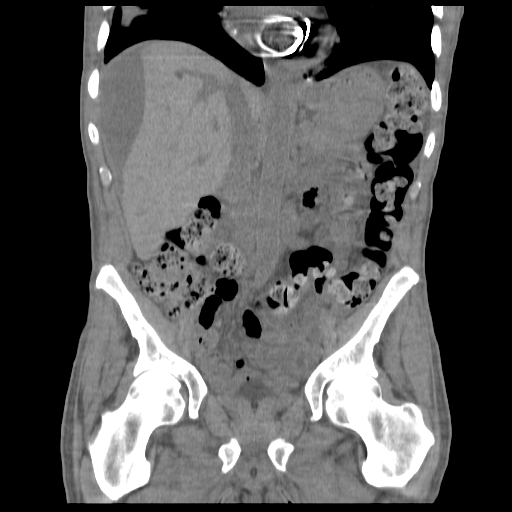
[im 43/77  soft-tissue]
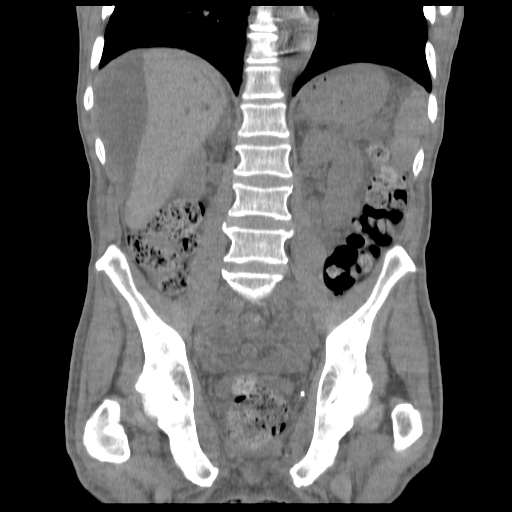

[17 of 46 positions shown; findings below may reference images not displayed]

FINDINGS: There is a small amount of residual pleural fluid at the
right base loculated anteriorly.  There is linear scarring or
atelectasis in the right lower lobe.  The left lower chest is
clear.

There is a low density subcapsular collection along the lateral
margin of the liver.  Transverse measurement is 10.4 x 4.6 cm.
This extends over a length of 14 cm.  This probably represents a
subcapsular hematoma which is now in the low density face.  Acute
bleeding would be expected to show much more density.  The spleen
is normal.  The pancreas region appears unremarkable.  Detail is
poor without contrast.  The aorta and IVC are unremarkable.  I do
not see any retroperitoneal bleeding.  No renal lesion is evident.
No ascites.  Hyperdense material is present dependent in the
gallbladder.  These could be gallstones.
IMPRESSION: Low density subcapsular collection along the lateral margin of the
liver measuring 14 x 10.4 or 4.6 cm.  This is consistent with an
evolving subcapsular hematoma.  There is no sign of any acute
bleeding.  No retroperitoneal hematoma or free intraperitoneal
fluid.

Gallstones

CT PELVIS
FINDINGS: A catheter is present within the bladder.  There is a
large amount of fecal matter in the rectosigmoid.  There is a large
amount of unopacified small bowel in the pelvis.  This makes
evaluation for masses quite difficult.  None is specifically
identified.
IMPRESSION: Large amount of fecal matter in the rectosigmoid.  No other
identifiable pathology in the pelvis.  Study limited given the
absence of oral or intravenous contrast.

## 2009-01-16 ENCOUNTER — Encounter (HOSPITAL_COMMUNITY): Admission: RE | Admit: 2009-01-16 | Discharge: 2009-04-16 | Payer: Self-pay | Admitting: Nephrology

## 2009-01-16 ENCOUNTER — Ambulatory Visit: Payer: Self-pay | Admitting: Internal Medicine

## 2009-02-13 ENCOUNTER — Ambulatory Visit: Payer: Self-pay | Admitting: Internal Medicine

## 2009-02-14 ENCOUNTER — Encounter: Payer: Self-pay | Admitting: Cardiology

## 2009-02-20 ENCOUNTER — Encounter (INDEPENDENT_AMBULATORY_CARE_PROVIDER_SITE_OTHER): Payer: Self-pay | Admitting: *Deleted

## 2009-03-07 ENCOUNTER — Telehealth (INDEPENDENT_AMBULATORY_CARE_PROVIDER_SITE_OTHER): Payer: Self-pay | Admitting: *Deleted

## 2009-03-08 ENCOUNTER — Encounter: Payer: Self-pay | Admitting: Internal Medicine

## 2009-03-13 ENCOUNTER — Ambulatory Visit: Payer: Self-pay | Admitting: Internal Medicine

## 2009-03-19 ENCOUNTER — Encounter: Payer: Self-pay | Admitting: Gastroenterology

## 2009-03-19 ENCOUNTER — Encounter: Payer: Self-pay | Admitting: Internal Medicine

## 2009-03-27 ENCOUNTER — Ambulatory Visit: Payer: Self-pay | Admitting: Internal Medicine

## 2009-04-17 ENCOUNTER — Ambulatory Visit: Payer: Self-pay | Admitting: Internal Medicine

## 2009-04-17 ENCOUNTER — Encounter (HOSPITAL_COMMUNITY): Admission: RE | Admit: 2009-04-17 | Discharge: 2009-07-16 | Payer: Self-pay | Admitting: Nephrology

## 2009-04-17 LAB — CONVERTED CEMR LAB
Albumin: 4.6 g/dL (ref 3.5–5.2)
Bilirubin Urine: NEGATIVE
Calcium: 8.8 mg/dL (ref 8.4–10.5)
Glucose, Bld: 90 mg/dL (ref 70–99)
Ketones, ur: NEGATIVE mg/dL
Phosphorus: 3.7 mg/dL (ref 2.3–4.6)
Protein, ur: NEGATIVE mg/dL
Sodium: 139 meq/L (ref 135–145)
Urobilinogen, UA: 1 (ref 0.0–1.0)
WBC, UA: NONE SEEN cells/hpf (ref <3)

## 2009-05-15 ENCOUNTER — Ambulatory Visit: Payer: Self-pay | Admitting: Internal Medicine

## 2009-06-06 ENCOUNTER — Ambulatory Visit: Payer: Self-pay | Admitting: Internal Medicine

## 2009-06-06 DIAGNOSIS — I429 Cardiomyopathy, unspecified: Secondary | ICD-10-CM | POA: Insufficient documentation

## 2009-06-12 ENCOUNTER — Ambulatory Visit: Payer: Self-pay | Admitting: Internal Medicine

## 2009-06-24 ENCOUNTER — Encounter: Payer: Self-pay | Admitting: Internal Medicine

## 2009-06-24 ENCOUNTER — Ambulatory Visit: Payer: Self-pay

## 2009-06-24 ENCOUNTER — Ambulatory Visit (HOSPITAL_COMMUNITY): Admission: RE | Admit: 2009-06-24 | Discharge: 2009-06-24 | Payer: Self-pay | Admitting: Internal Medicine

## 2009-06-24 ENCOUNTER — Ambulatory Visit: Payer: Self-pay | Admitting: Internal Medicine

## 2009-06-26 ENCOUNTER — Ambulatory Visit: Payer: Self-pay | Admitting: Internal Medicine

## 2009-07-02 ENCOUNTER — Ambulatory Visit: Payer: Self-pay | Admitting: Internal Medicine

## 2009-07-11 ENCOUNTER — Ambulatory Visit: Payer: Self-pay | Admitting: Internal Medicine

## 2009-07-22 ENCOUNTER — Encounter (HOSPITAL_COMMUNITY): Admission: RE | Admit: 2009-07-22 | Discharge: 2009-10-18 | Payer: Self-pay | Admitting: Nephrology

## 2009-07-25 ENCOUNTER — Ambulatory Visit: Payer: Self-pay | Admitting: Internal Medicine

## 2009-08-14 ENCOUNTER — Ambulatory Visit: Payer: Self-pay | Admitting: Family Medicine

## 2009-09-11 ENCOUNTER — Ambulatory Visit: Payer: Self-pay | Admitting: Family Medicine

## 2009-09-16 ENCOUNTER — Encounter: Payer: Self-pay | Admitting: Gastroenterology

## 2009-09-16 ENCOUNTER — Encounter: Payer: Self-pay | Admitting: Internal Medicine

## 2009-09-25 ENCOUNTER — Ambulatory Visit: Payer: Self-pay | Admitting: Family Medicine

## 2009-10-04 ENCOUNTER — Encounter: Payer: Self-pay | Admitting: Internal Medicine

## 2009-10-09 ENCOUNTER — Ambulatory Visit: Payer: Self-pay | Admitting: Family Medicine

## 2009-10-17 ENCOUNTER — Encounter (INDEPENDENT_AMBULATORY_CARE_PROVIDER_SITE_OTHER): Payer: Self-pay | Admitting: *Deleted

## 2009-10-21 ENCOUNTER — Encounter (HOSPITAL_COMMUNITY): Admission: RE | Admit: 2009-10-21 | Discharge: 2010-01-17 | Payer: Self-pay | Admitting: Nephrology

## 2009-10-23 ENCOUNTER — Ambulatory Visit: Payer: Self-pay | Admitting: Family Medicine

## 2009-11-13 ENCOUNTER — Ambulatory Visit: Payer: Self-pay | Admitting: Family Medicine

## 2009-11-27 ENCOUNTER — Ambulatory Visit: Payer: Self-pay | Admitting: Family Medicine

## 2009-12-10 ENCOUNTER — Ambulatory Visit: Payer: Self-pay | Admitting: Family Medicine

## 2009-12-11 ENCOUNTER — Ambulatory Visit: Payer: Self-pay | Admitting: Internal Medicine

## 2009-12-31 ENCOUNTER — Ambulatory Visit: Payer: Self-pay | Admitting: Family Medicine

## 2010-01-14 ENCOUNTER — Ambulatory Visit: Payer: Self-pay | Admitting: Family Medicine

## 2010-01-20 ENCOUNTER — Encounter (HOSPITAL_COMMUNITY): Admission: RE | Admit: 2010-01-20 | Discharge: 2010-04-20 | Payer: Self-pay | Admitting: Nephrology

## 2010-01-21 ENCOUNTER — Ambulatory Visit: Payer: Self-pay

## 2010-01-21 ENCOUNTER — Encounter: Payer: Self-pay | Admitting: Internal Medicine

## 2010-01-21 ENCOUNTER — Ambulatory Visit (HOSPITAL_COMMUNITY): Admission: RE | Admit: 2010-01-21 | Discharge: 2010-01-21 | Payer: Self-pay | Admitting: Internal Medicine

## 2010-01-21 ENCOUNTER — Ambulatory Visit: Payer: Self-pay | Admitting: Cardiology

## 2010-02-04 ENCOUNTER — Ambulatory Visit: Payer: Self-pay | Admitting: Family Medicine

## 2010-02-12 ENCOUNTER — Ambulatory Visit: Payer: Self-pay | Admitting: Family Medicine

## 2010-02-24 ENCOUNTER — Ambulatory Visit: Payer: Self-pay | Admitting: Family Medicine

## 2010-03-03 ENCOUNTER — Ambulatory Visit: Payer: Self-pay | Admitting: Family Medicine

## 2010-03-07 ENCOUNTER — Encounter: Payer: Self-pay | Admitting: Internal Medicine

## 2010-03-18 ENCOUNTER — Ambulatory Visit: Payer: Self-pay | Admitting: Family Medicine

## 2010-03-18 LAB — CONVERTED CEMR LAB: INR: 2.44 — ABNORMAL HIGH (ref <1.50)

## 2010-04-01 ENCOUNTER — Encounter (INDEPENDENT_AMBULATORY_CARE_PROVIDER_SITE_OTHER): Payer: Self-pay | Admitting: Family Medicine

## 2010-04-01 LAB — CONVERTED CEMR LAB: Prothrombin Time: 27.8 s — ABNORMAL HIGH (ref 11.6–15.2)

## 2010-04-23 ENCOUNTER — Encounter (INDEPENDENT_AMBULATORY_CARE_PROVIDER_SITE_OTHER): Payer: Self-pay | Admitting: Family Medicine

## 2010-04-23 LAB — CONVERTED CEMR LAB
INR: 2.7 — ABNORMAL HIGH (ref <1.50)
Prothrombin Time: 28.8 s — ABNORMAL HIGH (ref 11.6–15.2)

## 2010-05-12 ENCOUNTER — Encounter (HOSPITAL_COMMUNITY)
Admission: RE | Admit: 2010-05-12 | Discharge: 2010-08-05 | Payer: Self-pay | Source: Home / Self Care | Attending: Nephrology | Admitting: Nephrology

## 2010-06-18 ENCOUNTER — Encounter (INDEPENDENT_AMBULATORY_CARE_PROVIDER_SITE_OTHER): Payer: Self-pay | Admitting: Family Medicine

## 2010-07-27 ENCOUNTER — Encounter: Payer: Self-pay | Admitting: Internal Medicine

## 2010-07-28 ENCOUNTER — Encounter: Payer: Self-pay | Admitting: Thoracic Surgery (Cardiothoracic Vascular Surgery)

## 2010-08-05 NOTE — Letter (Signed)
Summary: Appointment - Reschedule  [facility]     [place], [place]    Phone:   Fax:      October 17, 2009 MRN: [id number]   [facility] [address] [place], [place]  [address]   Dear Mr. Wicke,   Due to a change in our office schedule, your appointment on  12-04-2009 at  4:00 p.m.             must be changed.  It is very important that we reach you to reschedule this appointment. We look forward to participating in your health care needs. Please contact us at the number listed above at your earliest convenience to reschedule this appointment.     Sincerely,    Lorne Skeens  [facility] Scheduling Team

## 2010-08-05 NOTE — Letter (Signed)
Summary: Pt's Medication List/[facility].  Pt's Medication List/[facility].   Imported By: Sherian Rein 09/25/2009 14:00:01  _____________________________________________________________________  External Attachment:    Type:   Image     Comment:   External Document

## 2010-08-05 NOTE — Letter (Signed)
Summary: [facility]   Imported By: Sherian Rein 09/25/2009 13:57:55  _____________________________________________________________________  External Attachment:    Type:   Image     Comment:   External Document

## 2010-08-05 NOTE — Assessment & Plan Note (Signed)
Summary: per check out/sf  Medications Added PROCRIT 3000 UNIT/ML SOLN (EPOETIN ALFA) 2 injections every Wednesday-pt D/C'd per Dr.  Gala Romney until f/u. Last injection on May 23rd 2011 TRAMADOL HCL 50 MG TABS (TRAMADOL HCL) take one tablet every 8 hrs as needed      Allergies Added: NKDA  Primary Provider:  Donia Guiles, M.D  CC:  rov.  Pt feeling fatigued due to heat and has a head cold.  [name]  History of Present Illness: [name] is a 65 year old male with  complicated past medical history as listed below. He returns today for routine followup.  TTE in 2010 showed normal EF with elevated gradient across MV (mean 14) with perivalvular leak. Follow-up TEE in June 2010 with EF 40-45% mild stenosis mean gradient = 8. No vegetation or abscess.   Returns for routine f/u.  Overall he is doing fairly well. He is retired but continues to do some work on cars at home. Denies dyspnea. Feels a little tired due to head cold. No fevers or chills. He has not had any lower extremity edema. He denies any palpitations. No syncope/presyncope.  He is been compliant with all his medications. He denies any smoking or drinking. His INR has been followed closely by the family medicine clinic. Most recent INR 2.6.  He has not had any bleeding.   Current Medications (verified): 1)  Warfarin Sodium 5 Mg Tabs (Warfarin Sodium) .... Use As Directed By Anticoagulation Clinic 2)  Prilosec 20 Mg Cpdr (Omeprazole) .... Take 1 Tablet By Mouth Once A Day 3)  Procrit 3000 Unit/ml Soln (Epoetin Alfa) .... 2 Injections Every Wednesday-Pt D/c'd Per Dr.  Gala Romney Until F/u. Last Injection On May 23rd 2011 4)  Potassium Chloride Cr 10 Meq Cr-Caps (Potassium Chloride) .... Take One Tablet By Mouth Daily 5)  Tramadol Hcl 50 Mg Tabs (Tramadol Hcl) .... Take One Tablet Every 8 Hrs As Needed  Allergies (verified): No Known Drug Allergies  Past History:  Past Medical History:  1. 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 MVR 6/09        a. ECHO 07/2008: EF 55%. mild periprosthetic MVR with high            transmitral gradient (mean 14)        b. normal coronaries by cath 6/09  2. Postoperative atrial fibrillation and atypical atrial flutter         a. s/p DC-CV 10/09. on amiodarone  3. History of heavy alcohol use/IV drug abuse  4. CHF         a. due to valvular disease and diastolic dysfunction  5. h/o liver capsular hematoma after throacentesis  6. History of GI bleed  7. Hypertension.   8. GERD.   9.Chronic back pain on disability.  10. Marked 1st degree AV block > 11. Dyphagia with normal barium swallow 3/10  Review of Systems       As per HPI and past medical history; otherwise all systems negative.   Vital Signs:  Patient profile:   65 year old male Height:      68 inches Weight:      176 pounds BMI:     26.86 Pulse rate:   87 / minute Pulse rhythm:   irregular BP sitting:   130 / 64  (left arm) Cuff size:   regular  Vitals Entered By: Judithe Modest CMA (December 11, 2009 11:10 AM)  Physical Exam  General:  Gen: well  appearing. no resp difficulty HEENT: normal Neck: supple. no JVD. Carotids 2+ bilat; no bruits. No  Cor: PMI nondisplaced. Mildly irregular. No rubs, gallops. mechanical s1. soft diastolic mitral murmur.  Lungs: clear Abdomen: soft, nontender, nondistended. No hepatosplenomegaly. No bruits or masses. Good bowel sounds. Extremities: no cyanosis, clubbing, rash, edema Neuro: alert & orientedx3, cranial nerves grossly intact. moves all 4 extremities w/o difficulty. affect pleasant    Impression & Recommendations:  Problem # 1:  MITRAL STENOSIS/ INSUFFICIENCY, NON-RHEUMATIC (ICD-424.0) Stable. Doing well. Needs f/u echo. Continue coumadin. Continue with SBE prophylaxis.  Problem # 2:  CARDIOMYOPATHY, PRIMARY, DILATED (ICD-425.4) No symptoms of HF. Avoiding beta-blockers due to marked 1 AVB. Consider ACE-I if EF < 45%.   Problem # 3:  AV  BLOCK, 1ST DEGREE (ICD-426.11) Stable. Avoid b-blockers if possible due to degree of 1st AVB.  Other Orders: EKG w/ Interpretation (93000) Echocardiogram (Echo)  Patient Instructions: 1)  Your physician has requested that you have an echocardiogram.  Echocardiography is a painless test that uses sound waves to create images of your heart. It provides your doctor with information about the size and shape of your heart and how well your heart's chambers and valves are working.  This procedure takes approximately one hour. There are no restrictions for this procedure. 2)  Your physician wants you to follow-up in:   1 year.  You will receive a reminder letter in the mail two months in advance. If you don't receive a letter, please call our office to schedule the follow-up appointment.

## 2010-08-05 NOTE — Letter (Signed)
Summary: [facility] Office Note  [facility] Office Note   Imported By: Roderic Ovens 10/04/2009 10:50:12  _____________________________________________________________________  External Attachment:    Type:   Image     Comment:   External Document

## 2010-08-05 NOTE — Letter (Signed)
Summary: [facility] Patient Note   [facility] Patient Note   Imported By: Roderic Ovens 04/18/2010 12:32:21  _____________________________________________________________________  External Attachment:    Type:   Image     Comment:   External Document

## 2010-08-18 ENCOUNTER — Other Ambulatory Visit: Payer: Self-pay

## 2010-08-18 ENCOUNTER — Encounter (HOSPITAL_COMMUNITY): Payer: Medicare Other | Attending: Nephrology

## 2010-08-18 DIAGNOSIS — D638 Anemia in other chronic diseases classified elsewhere: Secondary | ICD-10-CM | POA: Insufficient documentation

## 2010-08-18 DIAGNOSIS — N183 Chronic kidney disease, stage 3 unspecified: Secondary | ICD-10-CM | POA: Insufficient documentation

## 2010-08-19 LAB — POCT HEMOGLOBIN-HEMACUE: Hemoglobin: 11.5 g/dL — ABNORMAL LOW (ref 13.0–17.0)

## 2010-09-15 ENCOUNTER — Encounter (HOSPITAL_COMMUNITY): Payer: Medicare Other | Attending: Nephrology

## 2010-09-15 ENCOUNTER — Other Ambulatory Visit: Payer: Self-pay | Admitting: Nephrology

## 2010-09-15 ENCOUNTER — Other Ambulatory Visit: Payer: Self-pay

## 2010-09-15 DIAGNOSIS — D638 Anemia in other chronic diseases classified elsewhere: Secondary | ICD-10-CM | POA: Insufficient documentation

## 2010-09-15 DIAGNOSIS — N183 Chronic kidney disease, stage 3 unspecified: Secondary | ICD-10-CM | POA: Insufficient documentation

## 2010-09-15 LAB — RENAL FUNCTION PANEL
Albumin: 4.2 g/dL (ref 3.5–5.2)
BUN: 14 mg/dL (ref 6–23)
BUN: 15 mg/dL (ref 6–23)
CO2: 26 mEq/L (ref 19–32)
CO2: 24 mEq/L (ref 19–32)
Calcium: 8.8 mg/dL (ref 8.4–10.5)
Calcium: 9 mg/dL (ref 8.4–10.5)
Chloride: 106 mEq/L (ref 96–112)
Creatinine, Ser: 1.61 mg/dL — ABNORMAL HIGH (ref 0.4–1.5)
GFR calc Af Amer: 52 mL/min — ABNORMAL LOW (ref >60)
GFR calc non Af Amer: 43 mL/min — ABNORMAL LOW (ref >60)
Glucose, Bld: 81 mg/dL (ref 70–99)
Glucose, Bld: 82 mg/dL (ref 70–99)
Phosphorus: 3.2 mg/dL (ref 2.3–4.6)
Potassium: 3.6 mEq/L (ref 3.5–5.1)
Sodium: 140 mEq/L (ref 135–145)
Sodium: 139 mEq/L (ref 135–145)

## 2010-09-15 LAB — POCT HEMOGLOBIN-HEMACUE: Hemoglobin: 11.6 g/dL — ABNORMAL LOW (ref 13.0–17.0)

## 2010-09-15 LAB — IRON AND TIBC
Iron: 56 ug/dL (ref 42–135)
Saturation Ratios: 22 % (ref 20–55)
Saturation Ratios: 29 % (ref 20–55)
TIBC: 254 ug/dL (ref 215–435)
TIBC: 251 ug/dL (ref 215–435)
UIBC: 198 ug/dL
UIBC: 178 ug/dL

## 2010-09-16 LAB — RENAL FUNCTION PANEL
Albumin: 4.1 g/dL (ref 3.5–5.2)
Chloride: 107 mEq/L (ref 96–112)
Glucose, Bld: 88 mg/dL (ref 70–99)
Phosphorus: 2.7 mg/dL (ref 2.3–4.6)
Potassium: 3.6 mEq/L (ref 3.5–5.1)
Sodium: 139 mEq/L (ref 135–145)

## 2010-09-16 LAB — POCT HEMOGLOBIN-HEMACUE
Hemoglobin: 11.8 g/dL — ABNORMAL LOW (ref 13.0–17.0)
Hemoglobin: 11 g/dL — ABNORMAL LOW (ref 13.0–17.0)

## 2010-09-16 LAB — IRON AND TIBC: UIBC: 192 ug/dL

## 2010-09-16 LAB — FERRITIN: Ferritin: 372 ng/mL — ABNORMAL HIGH (ref 22–322)

## 2010-09-17 LAB — POCT HEMOGLOBIN-HEMACUE: Hemoglobin: 11.9 g/dL — ABNORMAL LOW (ref 13.0–17.0)

## 2010-09-18 LAB — RENAL FUNCTION PANEL
CO2: 26 mEq/L (ref 19–32)
Calcium: 9.2 mg/dL (ref 8.4–10.5)
Creatinine, Ser: 1.42 mg/dL (ref 0.4–1.5)
Glucose, Bld: 86 mg/dL (ref 70–99)

## 2010-09-20 LAB — RENAL FUNCTION PANEL
Albumin: 4.4 g/dL (ref 3.5–5.2)
BUN: 16 mg/dL (ref 6–23)
CO2: 24 meq/L (ref 19–32)
Calcium: 9.3 mg/dL (ref 8.4–10.5)
Chloride: 107 meq/L (ref 96–112)
Creatinine, Ser: 1.49 mg/dL (ref 0.4–1.5)
GFR calc non Af Amer: 47 mL/min — ABNORMAL LOW
Glucose, Bld: 99 mg/dL (ref 70–99)
Phosphorus: 3.2 mg/dL (ref 2.3–4.6)
Potassium: 3.9 meq/L (ref 3.5–5.1)
Sodium: 139 meq/L (ref 135–145)

## 2010-09-20 LAB — POCT HEMOGLOBIN-HEMACUE: Hemoglobin: 11.3 g/dL — ABNORMAL LOW (ref 13.0–17.0)

## 2010-09-20 LAB — IRON AND TIBC
Iron: 55 ug/dL (ref 42–135)
Saturation Ratios: 20 % (ref 20–55)
TIBC: 271 ug/dL (ref 215–435)
UIBC: 216 ug/dL

## 2010-09-20 LAB — FERRITIN: Ferritin: 109 ng/mL (ref 22–322)

## 2010-09-21 LAB — RENAL FUNCTION PANEL
Calcium: 8.8 mg/dL (ref 8.4–10.5)
GFR calc Af Amer: >60 mL/min (ref >60)
Glucose, Bld: 95 mg/dL (ref 70–99)
Phosphorus: 2.6 mg/dL (ref 2.3–4.6)
Sodium: 138 mEq/L (ref 135–145)

## 2010-09-21 LAB — POCT HEMOGLOBIN-HEMACUE
Hemoglobin: 11.5 g/dL — ABNORMAL LOW (ref 13.0–17.0)
Hemoglobin: 11.6 g/dL — ABNORMAL LOW (ref 13.0–17.0)

## 2010-09-21 LAB — IRON AND TIBC
Saturation Ratios: 21 % (ref 20–55)
UIBC: 205 ug/dL

## 2010-09-22 LAB — IRON AND TIBC
TIBC: 243 ug/dL (ref 215–435)
UIBC: 183 ug/dL

## 2010-09-22 LAB — RENAL FUNCTION PANEL
Albumin: 4.1 g/dL (ref 3.5–5.2)
Calcium: 8.7 mg/dL (ref 8.4–10.5)
Chloride: 104 mEq/L (ref 96–112)
Creatinine, Ser: 1.35 mg/dL (ref 0.4–1.5)
GFR calc Af Amer: >60 mL/min (ref >60)
GFR calc non Af Amer: 53 mL/min — ABNORMAL LOW (ref >60)
Phosphorus: 3.4 mg/dL (ref 2.3–4.6)

## 2010-09-22 LAB — FERRITIN: Ferritin: 116 ng/mL (ref 22–322)

## 2010-09-23 LAB — POCT HEMOGLOBIN-HEMACUE: Hemoglobin: 11.9 g/dL — ABNORMAL LOW (ref 13.0–17.0)

## 2010-09-24 LAB — POCT HEMOGLOBIN-HEMACUE
Hemoglobin: 11.2 g/dL — ABNORMAL LOW (ref 13.0–17.0)
Hemoglobin: 11.3 g/dL — ABNORMAL LOW (ref 13.0–17.0)

## 2010-09-28 LAB — IRON AND TIBC
Iron: 54 ug/dL (ref 42–135)
Saturation Ratios: 21 % (ref 20–55)
TIBC: 257 ug/dL (ref 215–435)

## 2010-09-28 LAB — RENAL FUNCTION PANEL
Albumin: 4.2 g/dL (ref 3.5–5.2)
BUN: 14 mg/dL (ref 6–23)
Calcium: 8.9 mg/dL (ref 8.4–10.5)
Chloride: 107 mEq/L (ref 96–112)
Creatinine, Ser: 1.4 mg/dL (ref 0.4–1.5)
GFR calc Af Amer: >60 mL/min (ref >60)

## 2010-09-28 LAB — POCT HEMOGLOBIN-HEMACUE: Hemoglobin: 10.6 g/dL — ABNORMAL LOW (ref 13.0–17.0)

## 2010-09-28 LAB — FERRITIN: Ferritin: 109 ng/mL (ref 22–322)

## 2010-10-06 LAB — POCT HEMOGLOBIN-HEMACUE: Hemoglobin: 11.5 g/dL — ABNORMAL LOW (ref 13.0–17.0)

## 2010-10-07 LAB — POCT HEMOGLOBIN-HEMACUE
Hemoglobin: 10.8 g/dL — ABNORMAL LOW (ref 13.0–17.0)
Hemoglobin: 11.9 g/dL — ABNORMAL LOW (ref 13.0–17.0)
Hemoglobin: 11.9 g/dL — ABNORMAL LOW (ref 13.0–17.0)

## 2010-10-08 LAB — RENAL FUNCTION PANEL
Albumin: 4.1 g/dL (ref 3.5–5.2)
CO2: 24 mEq/L (ref 19–32)
Chloride: 107 mEq/L (ref 96–112)
GFR calc Af Amer: >60 mL/min (ref >60)
GFR calc non Af Amer: 55 mL/min — ABNORMAL LOW (ref >60)
Potassium: 3.6 mEq/L (ref 3.5–5.1)

## 2010-10-08 LAB — POCT HEMOGLOBIN-HEMACUE
Hemoglobin: 10.3 g/dL — ABNORMAL LOW (ref 13.0–17.0)
Hemoglobin: 10.9 g/dL — ABNORMAL LOW (ref 13.0–17.0)
Hemoglobin: 10.9 g/dL — ABNORMAL LOW (ref 13.0–17.0)

## 2010-10-08 LAB — FERRITIN: Ferritin: 163 ng/mL (ref 22–322)

## 2010-10-08 LAB — IRON AND TIBC: Iron: 50 ug/dL (ref 42–135)

## 2010-10-09 LAB — RENAL FUNCTION PANEL
Albumin: 4.1 g/dL (ref 3.5–5.2)
Chloride: 106 mEq/L (ref 96–112)
GFR calc Af Amer: >60 mL/min (ref >60)
GFR calc non Af Amer: >60 mL/min (ref >60)
Phosphorus: 3.7 mg/dL (ref 2.3–4.6)
Potassium: 4 mEq/L (ref 3.5–5.1)
Sodium: 137 mEq/L (ref 135–145)

## 2010-10-09 LAB — FERRITIN: Ferritin: 266 ng/mL (ref 22–322)

## 2010-10-09 LAB — IRON AND TIBC
Iron: 52 ug/dL (ref 42–135)
UIBC: 184 ug/dL

## 2010-10-09 LAB — POCT HEMOGLOBIN-HEMACUE: Hemoglobin: 10.8 g/dL — ABNORMAL LOW (ref 13.0–17.0)

## 2010-10-10 LAB — POCT HEMOGLOBIN-HEMACUE
Hemoglobin: 11.8 g/dL — ABNORMAL LOW (ref 13.0–17.0)
Hemoglobin: 12.1 g/dL — ABNORMAL LOW (ref 13.0–17.0)
Hemoglobin: 11.1 g/dL — ABNORMAL LOW (ref 13.0–17.0)

## 2010-10-11 LAB — IRON AND TIBC
Iron: 44 ug/dL (ref 42–135)
Saturation Ratios: 21 % (ref 20–55)
TIBC: 211 ug/dL — ABNORMAL LOW (ref 215–435)

## 2010-10-11 LAB — RENAL FUNCTION PANEL
Albumin: 3.9 g/dL (ref 3.5–5.2)
BUN: 12 mg/dL (ref 6–23)
CO2: 25 mEq/L (ref 19–32)
Calcium: 8.6 mg/dL (ref 8.4–10.5)
Chloride: 106 mEq/L (ref 96–112)
Creatinine, Ser: 1.32 mg/dL (ref 0.4–1.5)
GFR calc Af Amer: >60 mL/min (ref >60)
GFR calc non Af Amer: 54 mL/min — ABNORMAL LOW (ref >60)

## 2010-10-11 LAB — FOLATE: Folate: 13.1 ng/mL

## 2010-10-11 LAB — POCT HEMOGLOBIN-HEMACUE: Hemoglobin: 10.6 g/dL — ABNORMAL LOW (ref 13.0–17.0)

## 2010-10-11 LAB — FERRITIN: Ferritin: 254 ng/mL (ref 22–322)

## 2010-10-12 LAB — POCT HEMOGLOBIN-HEMACUE
Hemoglobin: 11 g/dL — ABNORMAL LOW (ref 13.0–17.0)
Hemoglobin: 10.2 g/dL — ABNORMAL LOW (ref 13.0–17.0)
Hemoglobin: 11.5 g/dL — ABNORMAL LOW (ref 13.0–17.0)

## 2010-10-12 LAB — VITAMIN B12: Vitamin B-12: 391 pg/mL (ref 211–911)

## 2010-10-13 ENCOUNTER — Encounter (HOSPITAL_COMMUNITY): Payer: Medicare Other | Attending: Nephrology

## 2010-10-13 ENCOUNTER — Other Ambulatory Visit: Payer: Self-pay | Admitting: Nephrology

## 2010-10-13 DIAGNOSIS — D638 Anemia in other chronic diseases classified elsewhere: Secondary | ICD-10-CM | POA: Insufficient documentation

## 2010-10-13 DIAGNOSIS — N183 Chronic kidney disease, stage 3 unspecified: Secondary | ICD-10-CM | POA: Insufficient documentation

## 2010-10-13 LAB — IRON AND TIBC
Saturation Ratios: 15 % — ABNORMAL LOW (ref 20–55)
UIBC: 204 ug/dL

## 2010-10-13 LAB — FERRITIN: Ferritin: 295 ng/mL (ref 22–322)

## 2010-10-14 LAB — POCT HEMOGLOBIN-HEMACUE
Hemoglobin: 11.6 g/dL — ABNORMAL LOW (ref 13.0–17.0)
Hemoglobin: 13.2 g/dL (ref 13.0–17.0)
Hemoglobin: 10.6 g/dL — ABNORMAL LOW (ref 13.0–17.0)
Hemoglobin: 11 g/dL — ABNORMAL LOW (ref 13.0–17.0)

## 2010-10-15 LAB — FERRITIN: Ferritin: 364 ng/mL — ABNORMAL HIGH (ref 22–322)

## 2010-10-15 LAB — IRON AND TIBC
Iron: 47 ug/dL (ref 42–135)
TIBC: 239 ug/dL (ref 215–435)

## 2010-10-15 LAB — POCT HEMOGLOBIN-HEMACUE
Hemoglobin: 10.2 g/dL — ABNORMAL LOW (ref 13.0–17.0)
Hemoglobin: 9.9 g/dL — ABNORMAL LOW (ref 13.0–17.0)

## 2010-10-16 LAB — FERRITIN: Ferritin: 743 ng/mL — ABNORMAL HIGH (ref 22–322)

## 2010-10-16 LAB — IRON AND TIBC
Iron: 62 ug/dL (ref 42–135)
TIBC: 255 ug/dL (ref 215–435)

## 2010-10-16 LAB — POCT HEMOGLOBIN-HEMACUE
Hemoglobin: 8.9 g/dL — ABNORMAL LOW (ref 13.0–17.0)
Hemoglobin: 9 g/dL — ABNORMAL LOW (ref 13.0–17.0)
Hemoglobin: 9.8 g/dL — ABNORMAL LOW (ref 13.0–17.0)
Hemoglobin: 8.9 g/dL — ABNORMAL LOW (ref 13.0–17.0)

## 2010-10-20 LAB — POCT HEMOGLOBIN-HEMACUE: Hemoglobin: 9.4 g/dL — ABNORMAL LOW (ref 13.0–17.0)

## 2010-10-21 LAB — POCT HEMOGLOBIN-HEMACUE
Hemoglobin: 9.2 g/dL — ABNORMAL LOW (ref 13.0–17.0)
Hemoglobin: 8.9 g/dL — ABNORMAL LOW (ref 13.0–17.0)

## 2010-10-21 LAB — IRON AND TIBC: Saturation Ratios: 26 % (ref 20–55)

## 2010-11-10 ENCOUNTER — Other Ambulatory Visit: Payer: Self-pay | Admitting: Nephrology

## 2010-11-10 ENCOUNTER — Encounter (HOSPITAL_COMMUNITY)
Admission: RE | Admit: 2010-11-10 | Discharge: 2010-11-10 | Disposition: A | Discharge status: Home / Self Care | Payer: Medicare Other | Source: Ambulatory Visit | Attending: Nephrology | Admitting: Nephrology

## 2010-11-10 DIAGNOSIS — N183 Chronic kidney disease, stage 3 unspecified: Secondary | ICD-10-CM | POA: Insufficient documentation

## 2010-11-10 DIAGNOSIS — D638 Anemia in other chronic diseases classified elsewhere: Secondary | ICD-10-CM | POA: Insufficient documentation

## 2010-11-10 LAB — RENAL FUNCTION PANEL
Albumin: 4 g/dL (ref 3.5–5.2)
BUN: 14 mg/dL (ref 6–23)
GFR calc Af Amer: >60 mL/min (ref >60)
GFR calc non Af Amer: 55 mL/min — ABNORMAL LOW (ref >60)
Phosphorus: 2.5 mg/dL (ref 2.3–4.6)
Potassium: 3.2 mEq/L — ABNORMAL LOW (ref 3.5–5.1)
Sodium: 140 mEq/L (ref 135–145)

## 2010-11-10 LAB — IRON AND TIBC: TIBC: 230 ug/dL (ref 215–435)

## 2010-11-11 LAB — POCT HEMOGLOBIN-HEMACUE: Hemoglobin: 11.2 g/dL — ABNORMAL LOW (ref 13.0–17.0)

## 2010-11-18 NOTE — Consult Note (Signed)
[name], Richard NO.:  [id number]   MEDICAL RECORD NO.:  [id number]          PATIENT TYPE:  INP   LOCATION:  2008                         FACILITY:  [facility]   PHYSICIAN:  Richard Pick. Eden Emms, [name], FACCDATE OF BIRTH:  02-01-1946   DATE OF CONSULTATION:  DATE OF DISCHARGE:                                 CONSULTATION   A 65 year old patient we are asked to see regarding recent postop course  for mitral valve replacement; bleeding diathesis, on Coumadin; and  atrial flutter   [name] is an unfortunate 65 year old patient of Dr. Gala Romney.   He was hospitalized recently for endocarditis, which was culture  negative and underwent a mitral valve replacement on December 27, 2007 by  Dr. Cornelius Moras.  He had 2 attempted thoracenteses for right pleural effusion  and ultimately underwent a right VATS procedure.   He has developed a perihepatic hematoma and subcapsular hematoma with a  hemoglobin bleed down to a level of 5.   We are asked to help manage his Coumadin.   Talking to the patient, his primary complaint now is impaction and bowel  pain.  He is extremely constipated.  He is not having any palpitations,  chest pain, PND, or orthopnea.   He has developed significant hematuria, which was not apparent  preoperatively.  He apparently is being scheduled for a cystoscopy.   The patient's past medical history is remarkable for history of  congestive heart failure, history of chronic back pain, history of mild  chronic renal insufficiency, and history of alcohol abuse.   The patient has a history of SBE of the mitral valve, which necessitated  his mitral valve replacement.   His medications at home included Coumadin, Vicodin, digoxin 0.125 a day,  amiodarone 200 a day, and aspirin a day.   FAMILY HISTORY:  Noncontributory.   The patient is a previous alcoholic.  He quit smoking 20 years ago.  He  has had somewhat of a difficult rehab from his surgery.   He has no  known drug allergies.   PHYSICAL EXAMINATION:  GENERAL:  Remarkable for thin, anesthetic black  male who is in some distress from fecal impaction.  He is in a flutter,  rate of 80.  VITAL SIGNS:  His blood pressure is 110/70, respiratory rate 14, and  afebrile.  HEENT:  Unremarkable.  NECK:  Supple.  No lymphadenopathy, thyromegaly, or JVP elevation.  LUNGS:  Clear with good diaphragmatic motion.  No wheezing.  There is an  S1 click with no obvious MR murmur.  PMI is mildly increased, but not  displaced.  ABDOMEN:  Protuberant.  He has mild tenderness in the right upper  quadrant and mild bilateral lower quadrant pain and mild suprapubic  pain.  EXTREMITIES:  Distal pulses are intact.  No edema.  NEURO:  Nonfocal.  SKIN:  Warm and dry.  MUSCULOSKELETAL:  No muscular weakness.   His current hemoglobin is 11.1, potassium is 3.9, BUN 44, and creatinine  2.2.  EKG done on the January 10, 2008, shows atrial flutter with a QT  interval of 500 milliseconds.  His last protime from yesterday was 1.8 and he has been given fresh  frozen plasma.   IMPRESSION:  1. Mitral valve replacement.  It sounds normal by exam.  The valve is      less than 95 weeks old and he is in atrial flutter.  He is at risk      for thrombosis.  However, given his fairly life-threatening bleed      with a perihepatic and subcapsular hematoma and significant      hematuria requiring cystoscopy, his Coumadin will have to be held      for 5-7 days.  2. Perihepatic hematoma.  This is likely due to his previous attempts      at thoracentesis and video-assisted thoracic surgery.  I suspect      given his thin body habitus that he had a subdiaphragmatic      needlestick, the Hounsfield units on a CT scan are low and I do not      think that there is fresh blood in the liver.  However, he should      have a general surgery consult in regards to the risk of rebleeding      and/or infection since there is a fairly large area  of bleed.  3. Hematuria.  The patient needs cystoscopy particularly in light of      one we might be able to restart his Coumadin.  Neurology is on      board.  He has mild chronic renal insufficiency, but his creatinine      appears stable.  4. Rhythm.  The patient is in an atrial flutter, status post mitral      valve regurgitation.  In a long running, he may be a candidate for      flutter ablation.  However, I am somewhat concerned by what appears      to be an artifact or too short episodes of polymorphic ventricular      tachycardia.  He will have a stat magnesium and BMET.  We will stop      his digoxin and amiodarone for the time being and continue      telemetry.  He will have a followup EKG since the one done form the      January 10, 2008, showed a QT interval of 500 milliseconds.  5. Fecal impaction.  The patient was written for suppositories and      Dulcolax.  This is likely due to his chronic pain syndrome and need      for narcotic analgesics.   We would be happy to follow the patient along, but currently there is no  alternative but stop his Coumadin.      Richard Pick. Eden Emms, [name], [facility]  Electronically Signed     PCN/MEDQ  D:  02/13/2008  T:  02/14/2008  Job:  [id number]

## 2010-11-18 NOTE — Consult Note (Signed)
NAMEAMOS, MICHEALS NO.:  [id number]   MEDICAL RECORD NO.:  [id number]          PATIENT TYPE:  INP   LOCATION:  2920                         FACILITY:  [facility]   PHYSICIAN:  Charlynne Pander, D.D.S.DATE OF BIRTH:  31-Oct-1945   DATE OF CONSULTATION:  12/20/2007  DATE OF DISCHARGE:                                 CONSULTATION   [name] is a 65 year old male referred by Dr. Tressie Stalker for a  dental consultation.  The patient with known history of severe mitral  regurgitation.  The patient recently admitted with increased cardiac  symptoms and found to have probable endocarditis.  The patient now on IV  antibiotic therapy.  The patient with anticipated mitral valve  replacement with Dr. Cornelius Moras in the near future.  The patient is now seen  as part of pre-heart valve surgery dental protocol evaluation.   MEDICAL HISTORY:  1. Probable endocarditis with possible vegetations involving the      mitral valve.      a.     Status post transesophageal echocardiogram on December 19, 2007,       which revealed normal left ventricular function,    ejection fraction 55-65%, mild aortic valve regurgitation,  questionable vegetation involving the mitral valve, and wide    open mitral valve regurgitation.    b. Anticipated mitral valve replacement with Dr. Cornelius Moras.  1. Severe Mitral regurgitation as above.  2. Congestive heart failure.  3. Chronic back pain.  4. Anemia.  5. Gastroesophageal reflux disorder.  6. Thrombocytosis --this admission.   ALLERGIES:  None known.   MEDICATIONS:  1. Vancomycin 1000 mg IV every 12 hours.  2. Rocephin 1 g IV every 24 hours.  3. Protonix 40 mg daily.  4. Aspirin 81 mg daily.  5. Coreg 6.25 mg twice daily.  6. Heparin 5000 units subcutaneously every 8 hours.  7. Chlorhexidine rinses twice daily.  8. Gentamicin 70 mg IV every 8 hours.   SOCIAL HISTORY:  The patient is married.  The patient with a history of  heavy alcohol abuse, but  quit 2-3 years ago.  The patient is a  nonsmoker.  The patient previously worked in [employer], but  has been determined to be disabled secondary to chronic back pain.   FAMILY HISTORY:  Mother died with a history of coronary artery disease.  Father died with a history of gastrointestinal cancer.   FUNCTIONAL ASSESSMENT:  The patient currently dependent for multiple  ADLs.   REVIEW OF SYSTEMS:  This was reviewed from the chart and health history  assessment form for this admission.   CHIEF COMPLAINT:  Dental consultation requested prior to anticipated  heart valve surgery.   HISTORY OF PRESENT ILLNESS:  The patient with probable endocarditis and  severe mitral regurgitation.  The patient with anticipated mitral valve  replacement.  The patient now seen as part of preheart valve surgery  dental protocol evaluation.   The patient currently denies acute toothaches.  The patient notes that  he has 4 bad teeth.  The patient notes that these teeth need to be  extracted and actually requested them to be extracted at this time.  The  patient indicates that he last saw dentist approximately 20 years ago to  have 6 teeth pulled.  The patient denies having any partial dentures or  having regular dental care.   PHYSICAL EXAMINATION:  GENERAL:  The patient is a well-developed, well-  nourished male in no acute distress.  EXTRAORAL EXAM:  There is no significant lymphadenopathy noted.  The  patient denies acute TMJ symptoms.   INTRAORAL EXAM:  The patient with normal saliva.  There is no evidence  of abscess formation within the mouth.  The patient has atrophy of the  edentulous alveolar ridges.  The patient with maxillary right and  maxillary left buccal exostoses around remaining molars.   DENTITION:  The patient with 4 remaining teeth, #3, #15, #22, and #27.  Tooth #15 is present as a retained root segment.   PERIODONTAL:  The patient with chronic advanced periodontal disease  with  plaque calculus accumulations, generalized gingival recession, and tooth  mobility.  There is moderate-to-severe bone loss.   DENTAL CARIES:  There are multiple dental caries affecting the remaining  teeth.   ENDODONTIC:  The patient currently denies acute pulpitis symptoms.  The  patient does have apical pathology involving the tooth #3.   CROWN OR BRIDGE:  There are no crown or bridge restorations.   PROSTHODONTIC:  The patient denies presence of partial dentures.   OCCLUSION:  The patient with a poor occlusal scheme secondary to  multiple missing teeth, supereruption and drifting of the unopposed  teeth into the edentulous areas, and lack of replacement of the missing  teeth with dental prostheses.   RADIOGRAPHIC INTERPRETATION:  A panoramic x-ray was taken by the  Department of Radiology.   There are multiple missing teeth.  There is atrophy of the edentulous  alveolar ridges.  There is supereruption and drifting of the unopposed  teeth into the edentulous areas.  There is periapical pathology  involving tooth #3.  There are multiple dental caries noted.  There is a  poor occlusal scheme noted.   ASSESSMENT:  1. Probable endocarditis with current severe mitral regurgitation      defined to be wide open.  2. Anticipated mitral valve replacement with Dr. Cornelius Moras.  3. Multiple missing teeth.  4. Retained root segment in the area of #15.  5. Multiple dental caries involving the remaining teeth.  6. Apical periodontitis affecting tooth #3 with no obvious toothache      symptoms at this time.  7. History of oral neglect.  8. Buccal exostoses involving the upper right and upper left molars.  9. Poor occlusal scheme.  10.No history of dentures.  11.Risk for bleeding with invasive dental procedures due to the      current heparin therapy.  12.Need for antibiotic premedication prior to invasive dental      procedures.   PLAN/RECOMMENDATIONS:  1. I have discussed risks,  benefits, complications, and various      treatment options with the patient in relationship to his medical      and dental conditions and current heart valve disease.  We      discussed various treatment options to include no treatment, total      extraction with alveoloplasty, preprosthetic surgery as indicated,      and subsequent avocation of upper and lower complete dentures.  The      patient understands that dentures cannot be most likely fabricated  if the exostoses are taken care of.  The patient understands that      removing the exostoses, however, may place the patient at increased      risk at the time of the dental extractions and prior to the heart      valve surgery, therefore this decision will be left up to the oral      surgeon.  The patient will be referred to oral surgeon in my      absence as I will not return until January 02, 2008.  I will attempt      to contact oral surgeons, but if I am unable to do so, the medical      team may contact Dr. Gwendlyn Deutscher at [phone]; Dr. Hewitt Blade      at [phone]; Dr. Dutch Quint at [phone]; or Dr. Felton Clinton at [phone]      [phone] to see if one of them can provide dental extractions prior to      anticipated heart valve surgery.   1. Discussion of findings with the medical team and Dr. Cornelius Moras as      indicated.      Charlynne Pander, D.D.S.  Electronically Signed     RFK/MEDQ  D:  12/20/2007  T:  12/21/2007  Job:  [id number]   cc:   Salvatore Decent. Cornelius Moras, M.D.  Madolyn Frieze Jens Som, MD, [facility]  [employer] Hospitalist Team

## 2010-11-18 NOTE — Consult Note (Signed)
NAMEDYLYN, [facility] NO.:  [id number]   MEDICAL RECORD NO.:  [id number]          PATIENT TYPE:  INP   LOCATION:  2920                         FACILITY:  [facility]   PHYSICIAN:  Madolyn Frieze. Jens Som, MD, FACCDATE OF BIRTH:  22-Jul-1945   DATE OF CONSULTATION:  12/16/2007  DATE OF DISCHARGE:                                 CONSULTATION   [name] is a 65 year old gentleman with a past medical history of  mitral regurgitation, who we were asked to evaluate for that reason as  well as possible subacute bacterial endocarditis.  The patient states he  has had a murmur for his entire life.  He has not felt well since  November.  He was admitted in March 2009, with congestive heart failure  symptoms.  An echocardiogram at that time showed an ejection fraction of  50-55%, left ventricle enlargement, mild-to-moderate aortic  insufficiency, and moderate-to-severe mitral regurgitation.  He was  treated medically.  He was seen in the Cardiology office on Nov 11, 2007,  by Dr. Diona Browner.  Dr. Diona Browner apparently discussed with the patient at  that time that he may ultimately require mitral valve repair.  He did  state that followup echocardiograms should be performed.  However, the  patient was re-admitted on December 14, 2007, with complaints of chest pain.  The pain is described as sharp pain in the right side of the chest.  It  is described as a indigestion.  It does sometimes worsen after eating.  Does not radiate.  It is not pleuritic or positional.  Note, he has also  had intermittent shortness of breath, although he denies any significant  dyspnea on exertion, orthopnea, or PND.  He has had pedal edema.  Note,  he denies any fevers, but he does state that he has had chills at times.  An echocardiogram today revealed normal LV function, wide-open mitral  regurgitation, and a possible vegetation on the posterior leaflet.  Cardiology is now asked to further evaluate.   MEDICATIONS  AT PRESENT:  1. Aspirin 81 mg p.o. daily.  2. Protonix 40 mg p.o. daily.  3. Potassium 40 mEq p.o. daily.  4. Vancomycin and Rocephin were initiated today.   ALLERGIES:  The patient has no known drug allergies.   SOCIAL HISTORY:  He has remote history of alcohol abuse, but he states  he has not had any alcohol in approximately 3 years.  He does not smoke.   FAMILY HISTORY:  His mother apparently had coronary artery disease.  His  father died with a gastrointestinal cancer.   PAST MEDICAL HISTORY:  Significant for a murmur as described above and  moderate severe mitral regurgitation.  He has chronic back pain and  there is a history of gastroesophageal reflux disease.   REVIEW OF SYSTEMS:  He denies any headaches or fevers, but he has had  chills.  He denies any productive cough or hemoptysis.  There is no  dysphagia, odynophagia, melena, or hematochezia.  There is no dysuria or  hematuria.  There is no rash or seizure activity.  There is no  orthopnea, PND, but there can be pedal edema.  There is also fatigue.  Remaining systems are negative.   PHYSICAL EXAMINATION:  VITAL SIGNS:  Today, blood pressure of 103/51 and  his pulse is 99.  His T-max is 101.8.  He is 100% on 2 L.  GENERAL:  He is well developed and somewhat frail.  He is in no acute  distress at present.  SKIN:  Warm and dry.  He does not appear to be depressed.  There is no  peripheral clubbing.  I do not appreciate any peripheral stigmata of  SBE.  BACK:  Normal.  HEENT:  Normal with normal eyelids.  There is pale conjunctivae.  His  eyelids are normal.  NECK:  Supple with a normal upstroke bilaterally.  No bruits noted.  There is no jugular distention, and I cannot appreciate thyromegaly.  CHEST:  Clear to auscultation.  No expansion.  CARDIOVASCULAR:  Regular rhythm with normal S1 and S2.  There is a 3/6  systolic murmur at the apex, radiates to the left axilla.  ABDOMEN:  Nontender and nondistended.  Positive  bowel sounds.  No  hepatosplenomegaly.  No mass appreciated.  There is no abdominal bruit.  He has 2+ femoral pulses bilaterally.  No bruits.  EXTREMITIES:  No edema, and I can palpate no cords.  He has 2+ dorsalis  pedis pulses bilaterally.  NEUROLOGIC:  Grossly intact.   LABORATORY DATA:  White blood cell count of 17.4 with a hemoglobin 9.7  and hematocrit of 29.9.  His platelet count is 604.  BUN and creatinine  10 and 1.10 respectively.  Potassium is 3.6.  His urinalysis shows no  significant abnormalities.  He has cardiac markers that were negative.  A TSH is normal.  Liver functions are normal.  His BNP on admission was  657.   He did have a CT angiogram of his chest, it showed no evidence of acute  pulmonary embolus.  There is bilateral lower lobe subsegmental  atelectasis.  Review of his x-rays from May, reveals that he had an  indistinctness of the inferior endplate at C3 raising the possibility of  infection at that level.  There was also progressive narrowing of the  disk space at L5 through S1 with some irregularity possibly representing  an infectious diskitis.  EKG from December 14, 2007, shows a sinus  tachycardia rate of 106.  There is left atrial enlargement.  There are  nonspecific ST changes.  Note, the PR interval is normal.   DIAGNOSES:  1. Probable subacute bacterial endocarditis - [name] presents      with complaints of congestive heart failure symptoms.  His      echocardiogram shows wide-open mitral regurgitation and possibility      of a vegetation.  He also is having fevers with a T-max of 101.8.      He also has microcytic anemia, which may also be consistent with      subacute bacterial endocarditis.  Blood cultures have been drawn.      I would also check a sed rate, rheumatoid factor, and he will need      follow up electrocardiograms for his PR interval, although there is      no evidence of an abscess at least at this point.  We will begin      with  broad-spectrum antibiotics to include vancomycin and Rocephin.      The patient also needs an ID consult.  We will  plan a      transesophageal echocardiogram on Monday to further evaluate his      mitral valve.  He also will need a CVTS consult.  He most likely      has SBE.  Regardless, the patient has wide-open mitral      regurgitation as well as symptoms of congestive heart failure and      left ventricle enlargement.  His echocardiogram does have any      criteria for mitral valve replacement or repair even if this is      mitral regurgitation alone.  Note, the patient also apparently is      having back pain.  We do have a plain films of the spine from May,      that suggests possible diskitis.  He will need imaging of his back      to further evaluate this to rule out an embolic event prior to      surgery.  2. Mitral regurgitation - As per probable subacute bacterial      endocarditis.  3. Anemia - this is most likely related to his endocarditis, but I      agree with a GI evaluation to exclude pathology.   We will be happy to follow while he is in the hospital.      Madolyn Frieze. Jens Som, MD, [facility]  Electronically Signed     BSC/MEDQ  D:  12/16/2007  T:  12/17/2007  Job:  [id number]

## 2010-11-18 NOTE — Cardiovascular Report (Signed)
[name], [name]               ACCOUNT NO.:  [id number]   MEDICAL RECORD NO.:  [id number]          PATIENT TYPE:  AMB   LOCATION:  ENDO                         FACILITY:  [facility]   PHYSICIAN:  Bevelyn Buckles. Bensimhon, MDDATE OF BIRTH:  [birth year]   DATE OF PROCEDURE:  DATE OF DISCHARGE:                            CARDIAC CATHETERIZATION   INDICATION:  [name] is a 65 year old male with a history of mitral  valve endocarditis, and he is status post valve replacement.  His  postoperative course has been complicated by atrial flutter.  He was  brought today for TEE-guided cardioversion.   TEE showed an ejection fraction of about 60%, and St. Jude mitral valve  looked good.  There was no evidence of left atrial thrombus.   After further sedation, the patient received a single synchronized  biphasic 150 joules shock with a prompt conversion to sinus rhythm with  a first-degree AV block.  There were no apparent complications.      Bevelyn Buckles. Bensimhon, MD  Electronically Signed     DRB/MEDQ  D:  04/18/2008  T:  04/19/2008  Job:  [id number]

## 2010-11-18 NOTE — Assessment & Plan Note (Signed)
[facility]                            CARDIOLOGY OFFICE NOTE   NAME:[name], [name]                      MRN:          [id number]  DATE:05/22/2008                            DOB:          [birth year]    HISTORY:  Mr. Boivin is a 65 year old male with a history of mitral  valve endocarditis.  He is status post St. Jude mechanical valve  replacement on December 26, 2007.  Postoperative course was complicated by  recurrent right hemothorax and pleural effusion requiring chest tube.  He also had pancreatitis, volume overload, and parahepatic hematoma with  associated anemia.  He also has a history of alcoholic cirrhosis.  Ejection fraction is 50%.   Last month, he underwent TEE-guided cardioversion for postoperative  atrial flutter.  He was also started on amiodarone.  We also increased  his Lasix due to volume overload.   He returns today for routine followup.  From a cardiac point of view, he  is doing very well.  He says his breathing is much better.  He no longer  has any edema, no orthopnea, no PND.  He feels like his energy is much  improved since the cardioversion.  [facility] are [facility] at  [facility].  His only real complaint is abdominal bloating and pain.  He has been followed by GI in the past and is asking to follow back up  with them.   CURRENT MEDICATIONS:  1. Amiodarone 200 mg b.i.d.  2. Metoprolol 12.5 b.i.d.  3. Coumadin.  4. Aspirin 81 a day.  5. Omeprazole 20 a day.  6. Lasix 20 a day.   PHYSICAL EXAMINATION:  GENERAL:  He is in no acute distress.  Ambulates  around the clinic without any respiratory difficulty.  VITAL SIGNS:  Blood pressure is 98/56, heart rate is 70, weight is 167  which is down 3 pounds.  HEENT:  Normal.  NECK:  Supple.  There is no JVD.  Carotids are 2+ bilaterally without  any bruits and no lymphadenopathy or thyromegaly.  CARDIAC:  PMI is nondisplaced.  He is regular with no obvious murmurs.  He  does have a mechanical S1.  LUNGS:  Clear.  ABDOMEN:  Soft, nontender, nondistended.  No hepatosplenomegaly.  No  bruits.  No masses.  Good bowel sounds.  EXTREMITIES:  Warm with no cyanosis, clubbing, or edema.  No rash.  NEUROLOGIC:  Alert and oriented x3.  Cranial nerves II through XII are  intact.  Moves all 4 extremities without difficulty.  Affect is  pleasant.   EKG shows sinus rhythm with a marked first-degree AV block of  approximately 340 msec.  No significant ST-T wave abnormalities.  Also,  QT prolongation with a QTc of 500 msec.   ASSESSMENT AND PLAN:  1. Atrial flutter.  He is status post cardioversion.  He is      maintaining sinus rhythm.  He does have significantly prolonged PR      interval, but this has been asymptomatic.  We will continue to      follow.  I hope to keep him  on amiodarone for just a few months and      then stop it.  If his atrial flutter recurs, he may need to follow      up with Dr. Johney Frame for ablation.  2. Mitral valve replacement.  He is doing well.  This is stable.  He      does have a long PR interval, but there is no evidence of ongoing      infection or abscess.  3. Volume overload.  This is improved with Lasix.  4. Gastrointestinal issues.  We will have him follow up with Dr.      Russella Dar.   DISPOSITION:  We will get some blood work on him today including  amiodarone surveillance labs with a liver panel and thyroid panel as  well as a BMET to make sure his potassium is okay.  We will see him back  in several months for routine followup.     Bevelyn Buckles. Bensimhon, MD  Electronically Signed    DRB/MedQ  DD: 05/22/2008  DT: 05/23/2008  Job #: [id number]

## 2010-11-18 NOTE — H&P (Signed)
[name], [name] NO.:  [id number]   MEDICAL RECORD NO.:  [id number]          PATIENT TYPE:  INP   LOCATION:  2311                         FACILITY:  [facility]   PHYSICIAN:  Guadalupe Maple, M.D.  DATE OF BIRTH:  [birth year]   DATE OF ADMISSION:  12/16/2007  DATE OF DISCHARGE:                              HISTORY & PHYSICAL   PROCEDURE:  Intraoperative transesophageal echocardiography.   [name] is a 65 year old male with a history of severe mitral  regurgitation due to endocarditis.  He is now scheduled to undergo a  mitral valve replacement or repair by Dr. Cornelius Moras.  Intraoperative  transesophageal echocardiography was requested to evaluate the mitral  valve to determine if any other valvular pathology was present and to  assess the left ventricular and right ventricular function.   The patient was brought to the room in [facility] and general  anesthesia was induced without difficulty.  The trachea was intubated  without difficulty.  The transesophageal echocardiography probe was then  inserted into the esophagus without difficulty.   IMPRESSION:  Prebypass findings:  1. Mitral valve.  There was severe mitral regurgitation.  There were      vegetations present on the anterior and posterior mitral valve      leaflets.  There were flail segments noted on both leaflets.  There      was severe mitral regurgitation by color Doppler flow.  There was      systolic reversal of flow noted in the right upper pulmonary vein      by pulse wave Doppler by the Pisan method, the regurgitant orifice      area was 0.59 cm2 with a regurgitant volume of 64 mL.  There was no      abscess noted in the mitral annulus or the interventricular septum.  2. Aortic valve.  The aortic valve was trileaflet.  There were no      vegetations noted on the aortic valve.  There was trace aortic      insufficiency.  The leaflets opened normally and did not appear       calcified.  3. Left ventricle.  The left ventricular cavity was dilated.  There      was vigorous systolic function, ejection fraction estimated at 65%,      there is good contractility in all segments interrogated.  Left      ventricular end-diastolic diameter measured 6.55 cm in the mid      papillary level.  Left ventricular end-systolic diameter measured      4.9 cm.  Left ventricular wall thickness measured 1.1 cm.  There      was no thrombus noted in the left ventricular apex.  4. Right ventricle.  The right ventricular size was normal.  There was      good contractility of the right ventricular free wall.  5. Tricuspid valve.  The tricuspid valve appeared structurally normal.      There were no vegetations noted on the tricuspid leaflets, there      was trace tricuspid insufficiency.  6.  Interatrial septum.  The interatrial septum was intact without      evidence of patent foramen ovale or atrial septal defect.  7. Left atrium.  The left atrial size was increased, but there was no      thrombus noted in the left atrium or left atrial appendage.  8. Ascending aorta.  The ascending aorta showed a normal contour, was      not aneurysmal, and there was no significant atheromatous disease      noted in the aortic root or the proximal ascending aorta.  9. Descending aorta.  The descending aorta was not aneurysmal and it      measured 2.6 cm in diameter.  There was no significant atheromatous      disease noted.   Postbypass findings:  1. Mitral valve.  There was a bileaflet mechanical prosthesis noted at      the mitral position.  The leaflets opened well.  There was a usual      washing jet of mitral insufficiency noted.  There was no      perivalvular insufficiency noted.  2. Aortic valve.  Aortic valve was unchanged from the prebypass      studies, the leaflets opened normally, there was trace aortic      insufficiency.  3. Left ventricle.  The left ventricular cavity appeared  smaller and      measured 4.9 cm at end diastole in the mid papillary level.      Ejection fraction was again estimated at 60-65%.  4. Right ventricle.  The right ventricular size was normal.  There was      good contractility of right ventricular free wall.  5. Tricuspid valve.  The tricuspid valve was unchanged from the      prebypass study, there was trace tricuspid insufficiency.           ______________________________  Guadalupe Maple, M.D.     DCJ/MEDQ  D:  12/27/2007  T:  12/28/2007  Job:  [id number]

## 2010-11-18 NOTE — Op Note (Signed)
NAMECAMARON, [name]   MEDICAL RECORD NO.:  [id number]           PATIENT TYPE:   LOCATION:                                 FACILITY:   PHYSICIAN:  Salvatore Decent. Cornelius Moras, M.D. DATE OF BIRTH:  [birth year]   DATE OF PROCEDURE:  12/27/2007  DATE OF DISCHARGE:                               OPERATIVE REPORT   PREOPERATIVE DIAGNOSIS:  Bacterial endocarditis with severe mitral  regurgitation.   POSTOPERATIVE DIAGNOSIS:  Bacterial endocarditis with severe mitral  regurgitation.   PROCEDURE:  Right miniature thoracotomy for mitral valve replacement  (St. Jude mechanical prosthesis).   SURGEON:  Salvatore Decent. Cornelius Moras, MD   ASSISTANT:  Rowe Clack, PA-C   ANESTHESIA:  General.   BRIEF CLINICAL NOTE:  The patient is a 65 year old African American male  from [place] with long-standing history of heart murmur and severe  mitral regurgitation.  Moderate-to-severe mitral regurgitation was  demonstrated on an echocardiogram performed in 2003.  He was never  referred to a cardiologist.  Beginning in November 2008, the patient  developed chronic back pain requiring numerous presentations to the  emergency room.  He developed night sweats, loss of appetite, and weight  loss.  He ultimately developed congestive heart failure for which he was  hospitalized in March.  Echocardiogram at that time demonstrated severe  mitral regurgitation.  The patient was never referred to a cardiologist  during the admission.  In early June, the patient was hospitalized with  severe class IV congestive heart failure.  Echocardiogram was performed  demonstrating severe mitral regurgitation with vegetations on the mitral  valve consistent with bacterial endocarditis.  Blood cultures were  obtained, although blood cultures have all remained negative to date.  The patient has been treated with empiric antibiotics for culture-  negative endocarditis.  Cardiac catheterization was  performed  demonstrating normal coronary artery anatomy with no significant  coronary artery disease.  The patient is now brought to the operating  room for definitive management of severe mitral regurgitation with class  IV congestive heart failure.  A full consultation note has been dictated  previously.   OPERATIVE CONSENT:  The patient and his family have been counseled  regarding the indications, risks, and potential benefits of surgery.  Alternative treatment strategies have been discussed.  The relative  risks and benefits of minimally invasive approach versus a conventional  sternotomy have been reviewed at length.  In addition, the patient has  been counseled regarding the high likelihood that the valve will not be  repairable due to its appearance on baseline transesophageal  echocardiogram.  As such, alternatives for valve replacement have been  discussed.  After considerable discussion, the patient specifically  requests that if the valve cannot be repaired he wishes for his valve to  be replaced using a mechanical prosthesis.  He understands that this  will carry with it the need for lifelong anticoagulation, and even with  anticoagulation there remains a small but significant risk of  thromboembolism and/or bleeding complications.  All their questions have  been addressed.   OPERATIVE FINDINGS:  1. Active vegetations on the mitral valve with severe destruction of      both the anterior and posterior leaflet and severe mitral      regurgitation.  2. Dilated left ventricle.  3. Dramatic improvement in left ventricular function and decreased      left ventricular chamber size following successful mitral valve      replacement and separation from cardiopulmonary bypass.   OPERATIVE NOTE IN DETAIL:  The patient was brought to the operating room  on the above-mentioned date, placed in the supine position on the  operating table.  A radial arterial line was placed.  A  Swan-Ganz  catheter was placed through the left internal jugular approach.  General  endotracheal anesthesia was induced uneventfully under the care and  direction of Dr. Kipp Brood.  Initially, a dual-lumen endotracheal  tube was placed for management of single-lung ventilation.  A Foley  catheter was placed.  The patient was placed in the supine position on  the operating table with a roll under the right side, neck gently  extended and turned towards the left side.  The patient's right neck,  chest, abdomen, both groins, and both lower extremities were prepared  and draped in a sterile manner.   Baseline transesophageal echocardiogram was performed by Dr. Noreene Larsson.  This confirms the presence of vegetations on both the anterior and  posterior leaflet of the mitral valve with wide-open severe mitral  regurgitation.  The left ventricle was dilated.  The aortic valve  appears essentially normal with trace-to-mild central aortic  regurgitation.  There was very mild tricuspid regurgitation.  No other  abnormalities were noted.  There was severe pulmonary hypertension.   A small incision was made in the right inguinal crease.  The incision  was completed through the subcutaneous tissues with electrocautery.  The  right common femoral artery and vein were exposed through this incision.  The femoral artery and vein both appeared normal.   The right miniature anterior thoracotomy incision was performed  overlying the fourth intercostal space just lateral to the right nipple.  Single-lung ventilation was begun.  The right pleural space was entered  through the fourth intercostal space.  The lung collapsed and there were  no adhesions.  An 11-mm port was passed through a stab incision in the  anterior axillary line through the sixth intercostal space.  Carbon  dioxide gas was insufflated through the port into the right chest.  Soft  tissue retractor was placed.  Exposure was felt to be  excellent.  The  pericardium was incised 2 cm anterior to the phrenic nerve.  Silk  sutures were placed both anteriorly and posteriorly to retract the  pericardium for exposure.   The patient was heparinized systemically.  The right internal jugular  vein was cannulated using the Seldinger technique and a guidewire was  passed into the superior vena cava.  A CV-4 pledgeted Gore-Tex  pursestring sutures were placed on the anterior surface of right common  femoral artery and the right common femoral vein through the small groin  incision.  The femoral vein was then cannulated through the middle of  the pursestring suture and a long flexible guidewire was advanced up  through the femoral vein through the inferior vena cava and up through  the right atrium into the superior vena cava using transesophageal  echocardiogram for guidance.  A 22 x 25-French ESTECH long femoral  venous cannula was then passed over serial dilators up through the  femoral vein, the inferior vena cava, and the right atrium with the tip  extending up into the superior vena cava.  A 16-French cannula was  passed percutaneously over serial dilators into the superior vena cava  from the right internal jugular approach.  A 19-French femoral arterial  cannula was then passed over serial dilators through the pursestring  sutures into the anterior surface of the femoral artery.   Cardiopulmonary bypass was begun.  Pericardial incision was extended in  both directions.  The ascending aorta was inspected and dissected away  from the right main pulmonary artery underneath to facilitate subsequent  placement of aortic cross-clamp.  Two 4-0 Prolene pursestring sutures  were placed in the anterolateral surface of the ascending aorta.  A  pursestring suture was placed in the right atrium.  A retrograde  cardioplegic catheter was placed through the right atrium into the  coronary sinus using transesophageal echocardiogram to guide.   The  antegrade cardioplegia needle was placed in the ascending aorta.   The patient was cooled to 26 degrees systemic temperature.  The aortic  cross-clamp was applied, and cold blood cardioplegia was administered  initially in antegrade fashion through the aortic root.  The initial  cardioplegic arrest was notably rapid with quick diastolic arrest.  Supplemental cardioplegia was administered retrograde through the  coronary sinus catheter.  Repeat doses of cardioplegia were administered  intermittently throughout the cross-clamp portion of the operation  retrograde through the coronary sinus catheter to maintain isoelectric  electrocardiogram.   Left atriotomy incision was performed posteriorly through the  interatrial groove.  The floor of the left atrium was exposed using a  left atrial retractor attached to a straight arm exited through a small  stab incision just to the right side of the sternum through the fourth  intercostal space.  Exposure was felt to be excellent.  The mitral valve  was examined.  There were acute vegetations adherent to both the  anterior and posterior leaflets of the mitral valve.  There was severe  destruction of both the anterior posterior leaflet including large  gaping holes in the free margin.  As such, an attempted mitral valve  repair was clearly not workable.  Furthermore, the vegetations and  destruction involves the majority of the free margin chordae tendineae,  such that preservation of the subvalvular apparatus is also not  feasible.  Portions of the vegetations were cultured and sent as a  separate specimen labeled mitral valve vegetation.  The mitral valve was  then excised sharply and the mitral valve leaflets were sent to  pathology.  The mitral annulus and left ventricular chamber were  subsequently irrigated with copious saline solution and instruments were  changed at this juncture to ensure sterility.   Mitral valve replacement was  performed using interrupted 2-0 Ethibond  horizontal mattress pledgeted sutures with pledgets in the supra-annular  position.  The mitral annulus was quite large and sized to accept a 37-  mm mechanical prosthesis.  Mitral valve replacement was performed using  a St. Jude bileaflet mechanical valve (model [id number], serial  [id number]).  The valve was lowered into place uneventfully and all the  sutures were tied.  The valve appears to be seated nicely, and the  leaflets open and close without impingement.  There was no sign of any  residual gap around the circumference.  Rewarming was begun.   The left ventricular vent was placed across the mitral valve into the  left ventricular chamber.  The atriotomy incision was closed using a two-  layer closure of running 3-0 Prolene suture.  One final dose of warm  retrograde hot shot cardioplegia was administered.  The patient was  placed in Trendelenburg position and the lungs were ventilated.  An  epicardial pacing wire was fixed to the right ventricular free wall and  a 28-French soft chest tube was passed through a separate stab incision  and placed in to drain the pericardial sac.  The aortic cross-clamp was  then removed after total cross-clamp time of 136 minutes.   The heart began to beat spontaneously without need for cardioversion.  The retrograde cardioplegic catheter was removed.  The antegrade  cardioplegic catheter was removed, and the aortotomy site carefully  inspected for hemostasis.  Epicardial pacing wires were fixed to the  right atrial appendage.  The patient was rewarmed to 37 degrees  centigrade temperature.  The lungs were ventilated and the heart allowed  to fill briefly to evacuate any potential residual air.  The left  ventricular vent was then removed and the atriotomy closure completed.   The patient was weaned from cardiopulmonary bypass without difficulty.  The patient's rhythm at separation from bypass was normal  sinus rhythm  with first-degree AV block.  Total cardiopulmonary bypass time for the  operation is 184 minutes.  The patient was weaned from cardiopulmonary  bypass on dopamine at 5 mcg/kg per minute.  Follow up transesophageal  echocardiogram performed by Dr. Noreene Larsson after separation from bypass  demonstrates a well-seated bileaflet mechanical valve in the mitral  position.  There was no sign of any perivalvular leak.  There was  insignificant residual air.  Left ventricular function continuously  improve and left ventricular chamber size shrinks 3 inches traumatically  in comparison with preoperatively.   Protamine was administered to reverse the anticoagulation.  The right IJ  cannula was removed, and cannulation site controlled with a single  pursestring silk suture.  The femoral venous cannula was then removed  and pursestring suture secured.  The femoral arterial cannula was  removed and its pursestring suture secured.  There was an excellent  pulse in the distal femoral artery.   The right lung is now deflated, and the right chest carefully examined.  Meticulous surgical hemostasis was ascertained.  A 28-French Bard chest  tube was placed through the inferior port incision to drain the right  pleural space.  The pericardium was closed using interrupted silk  sutures.  The On-Q continuous pain management system was utilized to  facilitate postoperative pain control.  One 5-inch catheter supplied  with the On-Q kit was tunneled initially through the subcutaneous tissue  and then subsequently through intercostal space to lie subpleurally to  cover the second through the fifth intercostal nerve roots posteriorly.  The thoracotomy incision was closed in multiple layers and the skin was  closed with a subcuticular skin closure.  The groin incision was  inspected for hemostasis and subsequently closed in layers.  The skin  incision closed with subcuticular skin closure.   The patient  tolerated the procedure well and was transported to the  Surgical Intensive Care Unit in stable condition.  There were no  intraoperative complications.  All sponge, instrument, and needle counts  were verified correct at completion of the operation.  The patient was  transfused 2 units of packed red blood cells during cardiopulmonary bypass due to anemia was present prior to surgery and exacerbated with  hemodilution.  Salvatore Decent. Cornelius Moras, M.D.  Electronically Signed     CHO/MEDQ  D:  12/27/2007  T:  12/28/2007  Job:  [id number]   cc:   Jonelle Sidle, MD  Madolyn Frieze. Jens Som, MD, [facility]

## 2010-11-18 NOTE — Discharge Summary (Signed)
NAMECHATHAM, [name]   MEDICAL RECORD NO.:  [id number]          PATIENT TYPE:  INP   LOCATION:  2920                         FACILITY:  [facility]   PHYSICIAN:  Lonia Blood, M.D.DATE OF BIRTH:  August 28, 1945   DATE OF ADMISSION:  12/16/2007  DATE OF DISCHARGE:                               DISCHARGE SUMMARY   INTERIM DISCHARGE SUMMARY   DATE OF DISCHARGE:  To be determined.   PRIMARY CARE PHYSICIAN:  Dr. Donia Guiles at [facility].   DISCHARGE DIAGNOSES:  1. Subacute bacterial endocarditis.      a.     Multilevel spinal column osteomyelitis/diskitis - antibiotic       therapy being directed by infectious disease service.      b.     Mitral valve vegetation with severe mitral valve       regurgitation - pending mitral valve repair/replacement.      c.     Multiple carious teeth - extraction carried out by oral       surgery prior this open heart surgery.  2. Significant anemia.      a.     Findings most consistent with anemia of chronic disease       likely related to endocarditis.      b.     Bone marrow signal on the MRI abnormal, possibly simply due       to anemia but also possibility of myelofibrosis or leukemia must       be considered - follow-up in outpatient setting an absolute       requirement.  3. Gastroesophageal reflux disease.  4. Valvular congestive heart failure secondary to wide-open mitral      valve regurgitation.  5. Remote history of heavy alcohol abuse.  6. History of working in a [employer].  7. Chronic low-back pain, on disability.   DISCHARGE MEDICATIONS:  To be determined time of discharge.   CONSULTATIONS:  1. Dr. Jeani Hawking.  2. [facility] - Dr. Olga Millers.  3. Dr. Kristin Bruins - inpatient dental medicine.  4. Dr. Tressie Stalker - cardiovascular surgery.   PROCEDURE:  1. Transthoracic echocardiogram.  2. Transesophageal echocardiogram  3. Surgical dental extraction.  4.  Placement of double-lumen PICC line.  5. Right heart catheterization - Dr. Nicholes Mango, December 19, 2007.   FOLLOW-UP:  Specific requirements for outpatient follow-up will be  prescribed at the time of the patient's discharge.  At this point,  however, it is clear that a follow-up MRI of the cervical, thoracic and  lumbar spine should be accomplished.  This will be necessary to  reevaluate the abnormal marrow signal appreciated on the patient's  illness dated December 17, 2007.  This interim summary is being dictated  specifically to alert the patient's primary MD of the need of this  follow-up.   HOSPITAL COURSE:  [name] is a pleasant 65 year old gentleman  with a known history of chronic severe low-back pain and known mitral  valve regurgitation who presented to [facility] on December 14, 2007, with complaints of  chest pain.  The patient was admitted to the  hospital for further evaluation.  He was found to be anemic with a  hemoglobin 9.6 and a normal MCV.  GI was consulted for evaluation.  It  was felt that the patient required an EGD and a colonoscopy, but his  cardiac issues, however, were not deemed to be stable enough to allow  such.  Investigation of cardiac issues was carried out with a  transthoracic echocardiogram.  This was interpreted by [facility].  This revealed essentially wide-open mitral valve  regurgitation.  There was also concern over the possibility of a  probable significant mitral valve vegetation.  Blood cultures were  accomplished but the patient had already been placed on antibiotics in  the outpatient setting.  Blood cultures were unrevealing.  After the  results of echocardiogram, it was felt the patient would require a valve  surgery.  The patient was transferred to [facility] on December 16, 2007.  Cardiac surgery was consulted to evaluate the patient.  They  agreed that the patient needed a mitral valve repair or replacement  and  dental surgery was consulted.  The patient had multiple carious teeth  and required extraction.  This was carried by oral surgery during the  patient's hospital stay.  The patient also underwent a transesophageal  echocardiogram to further delineate the nature of his mitral valve  regurgitation.  He underwent a right heart catheterization for further  evaluation.  He was noted to have normal coronary arteries.  After  dental extraction and clean cardiac catheterization, the patient was  cleared for cardiac surgery.  ID team had been consulted and continued  to direct the patient's antibiotic therapy via his PICC line.  On  today's date, December 26, 2007, the patient is clinically stable.  Vital  signs are stable and his mild hypotension is being treated with low-dose  dopamine and is well tolerated.  The patient is presently scheduled to  be taken to the OR tomorrow morning for mitral valve repair versus  replacement.  Ongoing care from this point forward will be under  direction of the cardiac surgery team.  Follow-up issues that are  apparent at the present time are as noted above.  Clearly, extensive  additional follow-up arrangements will need to be made for ongoing  attention to the patient's cardiac and infectious disease needs.      Lonia Blood, M.D.  Electronically Signed     JTM/MEDQ  D:  12/26/2007  T:  12/26/2007  Job:  [id number]   cc:   Melvern Banker

## 2010-11-18 NOTE — Consult Note (Signed)
[name], [name]               ACCOUNT NO.:  [id number]   MEDICAL RECORD NO.:  [id number]          PATIENT TYPE:  INP   LOCATION:  1406                         FACILITY:  [facility]   PHYSICIAN:  Jordan Hawks. Elnoria Howard, MD    DATE OF BIRTH:  Aug 03, 1945   DATE OF CONSULTATION:  12/15/2007  DATE OF DISCHARGE:                                 CONSULTATION   PRIMARY CARE PHYSICIAN:  [facility].   REASON FOR CONSULTATION:  Iron deficiency anemia.   HISTORY OF PRESENT ILLNESS:  This is a 65 year old gentleman with a past  medical history of moderate to severe mitral regurgitation and now  congestive heart failure, who was admitted to the hospital with  complaints of chest pain.  The patient stated that he was having pain  for the past few days, and apparently he takes narcotic medication to  help treat his pain.  Unfortunately, his current pain is associated with  shortness of breath, and subsequently he was admitted to the hospital  for further evaluation and treatment.  He was admitted for CHF and in  hospital workup did reveal that he has moderate to severe mitral  regurgitation. However, his systolic ejection of about 50-55%.  The  patient was not compliant with his medications and current BNP values  note that he has a mild congestive heart failure.  During the evaluation  he was noted to have a microcytic anemia, but he did not have any  further workup in the past.  The patient does report having some type of  gastrointestinal cancer that resulted in the death of his father.  He  denies taking any NSAID medications.  No reports of hematochezia or  melena.   PAST MEDICAL HISTORY/PAST SURGERY HISTORY:  As stated above.   FAMILY HISTORY:  As stated above in the history of present illness.   REVIEW OF SYSTEMS:  As stated above in the history of present illness.  Otherwise negative.   ALLERGIES:  No known drug allergies.   MEDICATIONS:  1. Aspirin 81 mg p.o. daily.  2. Lasix 40 mg IV.  3. Percocet.  4. Protonix 40 mg p.o. daily.  5. Potassium chloride 40 mEq p.o. daily.  6. Zofran 4 mg IV every 8 hours p.r.n.   PHYSICAL EXAMINATION:  VITAL SIGNS:  Blood pressure 86/59, heart rate  102, respirations 16, temperature is 99.2.  GENERAL:  The patient is in no acute distress.  Alert and oriented.  HEENT:  Normocephalic, atraumatic.  Extraocular muscles intact.  NECK:  Supple.  No lymphadenopathy.  LUNGS:  Clear to auscultation bilaterally.  CARDIOVASCULAR:  Regular rate, rhythm.  However, there is a murmur  consistent with mitral regurgitation.  ABDOMEN:  Flat, soft, nontender, nondistended.  EXTREMITIES:  No clubbing, cyanosis or edema.  RECTAL EXAMINATION:  Heme-positive and there is a possibility of a  pedunculated rectal polyp.   LABORATORY DATA:  White blood cell count is 13.8.  Hemoglobin 9.6, MCV  is 76.9.  Platelets at 588.  Sodium 134, potassium 3.4, chloride 96, CO2-  29, glucose 94 BUN 10, creatinine 0.9.  BNP is  657.  Iron %saturation is  6%.  Protein is 544.   IMPRESSION:  1. Iron deficiency anemia.  2. Heme-positive stool.  3. Questionable rectal polyp.  4. Severe mitral regurgitation.   After evaluation of the patient, I believe he does require an  esophagogastroduodenoscopy and colonoscopy while he is in the hospital.  However, during this examination the patient was complaining of active  chest pain.  He is noted to have a mild chronic heart failure with  elevation of his BNP.  Because of his current complaint of chest pain, I  will hold off on performing an esophagogastroduodenoscopy and  colonoscopy.  Hopefully, his chronic heart failure symptoms and chest  pain can be under good control, or at least cleared by cardiology to  undergo the esophagogastroduodenoscopy and colonoscopy.  At earliest,  the patient may have the procedure on Monday or Tuesday, pending his  clinical status.      Jordan Hawks Elnoria Howard, MD  Electronically Signed      PDH/MEDQ  D:  12/15/2007  T:  12/15/2007  Job:  [id number]   cc:   Melvern Banker  Fax: [phone]

## 2010-11-18 NOTE — Discharge Summary (Signed)
[name], [name]               ACCOUNT NO.:  [id number]   MEDICAL RECORD NO.:  [id number]          PATIENT TYPE:  INP   LOCATION:  5148                         FACILITY:  [facility]   PHYSICIAN:  Herbie Saxon, MDDATE OF BIRTH:  1945-07-22   DATE OF ADMISSION:  02/10/2008  DATE OF DISCHARGE:  03/03/2008                               DISCHARGE SUMMARY   ADDENDUM   DISCHARGE DIAGNOSES:  In addition to that already dictated:  1. Subcapsular liver hematoma, improved status post mitral valve      surgery.  2. Liver cirrhosis.  3. Acute blood loss anemia.  4. Gastrointestinal bleed recurrence.  5. Gastritis.  6. Chronic kidney disease stage III.  7. History of mitral valve endocarditis.  8. History of atrial flutter with controlled ventricular rate.  9. Hematuria improved.  10.Colon polyps, status post polypectomy on February 24, 2008.   The patient had 2 episodes of hematochezia on March 01, 2008, with  Hemoccult strongly positive and a drop in hematocrit from 28 to 25.  Gastroenterology was consulted and Dr. Russella Dar advocated holding the  Lovenox and Coumadin, and we were considering flexible sigmoidoscopy if  the GI bleed persisted.  The opinion was that this is likely a leak as  the patient had polypectomy on February 24, 2008.  However, the  hematochezia is clinically improved.  The patient is still  guaiac-  positive, and they are advocating Lovenox treatment for now, possibly we  are starting Coumadin later and to be very vigilant working up for GI  bleed.  The patient has been advocated extensively on the need for  anticoagulation with close monitoring and is very aware of this .  He is  going to be followed up by the home health RN to ensure that he is  getting his anticoagulation.  The patient is very anxious to be  discharged.  He was transfused with 2 units of packed red blood cells on  March 01, 2008.  His hemoglobin is stable around 10.  Discharge  condition is  stable.  Diet to be renal, heart healthy.  Activity to  increase slowly as tolerated.  The patient to follow up with his primary  care physician at [facility] on Monday, March 05, 2008,  for possibly  restarting the low-dose Coumadin 2 mg daily as outpatient if there is no  active GI bleed.  In the meantime, the patient has been sent home on  Lovenox 60 mg subcu q.12 h for the next 5 days.  He needs to follow up  with Gastroenterology, Dr. Russella Dar, in 1-2 weeks as needed.  Follow up  with Dr. Eden Emms, Cardiology, in 1-2 weeks as needed.   MEDICATIONS ON DISCHARGE:  1. Lovenox 60 mg subcu q.12 h. for 5 days on review.  This Lovenox      could be discontinued once the patient is restarted on Coumadin.  2. Ferrous sulfate 325 mg b.i.d.  3. Metoprolol 12.5 mg b.i.d.  4. MiraLax 17 g daily as needed.  5. Lortab 7.5/500 mg q.6 h. p.r.n.  6. Enteric coated aspirin 81 mg daily.  7. Amiodarone 200 mg daily.  8. Oxycodone 5 mg daily.  9. Docusate 100 mg twice daily.  10.Tylenol 650 mg q.6 h. p.r.n.  11.Lasix 40 mg daily.  12.KCl 20 mEq daily.  13.Digoxin 125 mcg daily.   PHYSICAL EXAMINATION:  GENERAL:  On examination, the patient is an  elderly man, not in acute distress.  VITAL SIGNS:  Temperature is 98, pulse is 76, respiratory rate is 20,  and blood pressure 96/61.  Not jaundiced, clinically ill looking.  HEENT:  Head is atraumatic and normocephalic.  Mucous membranes are  moist.  No epistaxis.  Oropharynx and nasopharynx are clear.  NECK:  Supple.  No adenopathy.  No carotid bruit or thyromegaly.  CHEST:  Clinically clear.  CARDIAC:  Heart sounds 1 and 2.  Systolic ejection murmur 2/6.  ABDOMEN:  Benign.  NEUROLOGIC:  He is alert and oriented in time, place, and person with no  pedal edema.  Cranial nerves, motor, and sensory system are grossly  normal.   The patient is to have repeat CBC and BMP on Monday, March 05, 2008, as  outpatient.   The labs today; hematocrit is 29.  INR  is 1.2.  BUN is 38, creatinine is  1.6, sodium 138, potassium 4.0, chloride 100, bicarbonate 31, and  glucose 90.  WBC 7.7 and platelet count 359.   The patient's illness, medications, tests, treatment and discharge plan  explained to him.  The need for compliance and anticoagulation, easy  propensity for GI bleed because of underlying liver cirrhosis,  gastritis, and colon polyps also explained to him and he verbalizes need  to closely monitor anticoagulation treatment with clinic visit, has been  advised to present to the emergency room if he has any recurrence of GI  bleed.      Herbie Saxon, MD  Electronically Signed     MIO/MEDQ  D:  03/03/2008  T:  03/04/2008  Job:  [id number]   cc:   Venita Lick. Russella Dar, MD, Malachy Mood C. Eden Emms, MD, [facility]

## 2010-11-18 NOTE — Letter (Signed)
April 03, 2008    Noralyn Pick. Eden Emms, MD, [facility]  [address]  [address]  [place], [place] [address]   RE:  MURIEL, [name]  MRN:  [id number]  /  DOB:  1946/02/26   Dear Theron Arista,   It is my pleasure to see Mr. Roudebush for electrophysiologic evaluation  of atypical atrial flutter today.  As I am sure you remember, Mr.  Cherian is a pleasant 65 year old gentleman with valvular heart disease  who recently underwent mitral valve replacement by Dr. Cruz Condon in December 27, 2007.  At that time, he underwent right minithoracotomy for mitral  valve replacement using a [employer] mechanical prosthetic valve.  The  patient tolerated the procedure well, but did develop postoperative  atrial fibrillation.  His hospital course was complicated by a right  hemothorax, which required thoracentesis and drainage.  He underwent  VATS procedure on January 16, 2008.  He was initiated on Coumadin and  amiodarone.  His atrial fibrillation regularized into an atypical atrial  flutter at that time and the patient was discharged with both Coumadin  and amiodarone.  He subsequently developed hematochezia and underwent GI  evaluation in August of 2009.  He was felt to have resolved GI bleeding  and was subsequently restarted on Coumadin.  Although, the patient's  hospital discharge on August 29 and had patient taking amiodarone 200 mg  daily, the patient has subsequently discontinued this medication for  unclear etiologies.  He does not ever recall taking amiodarone at all.  He is unsure of any difficulties with this medication.  The patient has  had typical postoperative lower extremity edema and symptoms of  congestive heart failure.  He has been followed in [facility] and  initiated on Lasix.  He reports some improvement in his lower extremity  edema.  He reports complete abstinence from alcohol.  He denies chest  discomfort, palpitations, near syncope, or syncope.  He has subsequently  been compliant with Coumadin  therapy.  He is otherwise without complaint  today.   PAST MEDICAL HISTORY:  1. Bacterial endocarditis with subsequent mitral valve replacement (as      above).  2. Postoperative atrial fibrillation and atypical atrial flutter (as      above).  3. History of heavy alcohol use.  4. Non-ischemic cardiomyopathy.  5. New York Heart Association Class III heart failure.  6. History of GI bleed (as above).  7. History of alcoholism.  8. Hypertension.  9. GERD.  10.Chronic back pain on disability.   ALLERGIES:  No known drug allergies.   MEDICATIONS:  1. Coumadin 4 mg alternating with 6 mg daily.  2. Aspirin 81 mg daily.  3. Tramadol 50 mg q.6 h. p.r.n.  4. Metoprolol 12.5 mg b.i.d.  5. Omeprazole 20 mg b.i.d.  6. Vitamin B1 250 mg daily.  7. Potassium chloride 20 mEq b.i.d.  8. Lasix 20 mg Monday, Wednesday, and Friday.   SOCIAL HISTORY:  The patient lives with his spouse in [place].  He  has a history of heavy alcohol use, but currently reports being in  remission.  He is a retired Curator, currently seeking disability.  He  quit smoking in 1964 and denies drug use.   FAMILY HISTORY:  The patient's father died at age 6 of natural causes.  His mother died at age 79 from heart surgery.   REVIEW OF SYSTEMS:  All systems are reviewed and negative, except as  outlined in the HPI above.  PHYSICAL EXAMINATION:  VITAL SIGNS:  Blood pressure 111/68, heart rate  79, respirations 18, and weight 169 pounds.  GENERAL:  The patient is a thin African American male in no acute  distress.  He is alert and oriented x3.  HEENT:  Normocephalic and atraumatic.  Sclerae clear.  Conjunctiva pink.  Oropharynx clear.  NECK:  Supple.  No JVD, lymphadenopathy, or bruits.  HEART:  Irregularly irregular rhythm.  Crisp mechanical S1.  No  appreciable murmurs.  GI:  Soft, nontender, and nondistended.  Positive bowel sounds.  EXTREMITIES:  No clubbing or cyanosis.  2+ pitting edema is noted   bilaterally.  NEUROLOGIC:  Strength and sensation are intact.  SKIN:  No ecchymosis or lacerations.  MUSCULOSKELETAL:  No deformity or atrophy.  PSYCH:  Euthymic mood, flat affect.   LABORATORY DATA:  EKG today documents atypical atrial flutter with a  ventricular rate of 76 beats per minute, otherwise unremarkable.  Recent  INRs March 21, 2008, 1.9 and March 28, 2008, 2.9.   IMPRESSION:  [name] is a very pleasant 65 year old gentleman with  valvular heart disease status post recent mechanical mitral valve  replacement, who now presents for Electrophysiology consultation  regarding atypical atrial flutter and atrial fibrillation.  The patient  developed postoperative atrial fibrillation, which has subsequently  organized into atrial flutter.  The patient has been noncompliant with  amiodarone therapy.  He is currently compliant with Coumadin and has  maintained a therapeutic INR recently.  He appears to be tolerating  atrial flutter relatively well, though he has typical post valvular  replacement heart failure for which he has been initiated on Lasix.  I  believe that he will continue to require diuresis over the next few  months.  I suspect that he may gain some improvement in his heart  failure symptoms with conversion to sinus rhythm.   PLAN:  Therapeutic strategies for atrial fibrillation and atrial flutter  were discussed in detail with the patient today including both medicine  and catheter-based therapies.  Given that the patient's mechanical  mitral valve, I do not believe that he is an optimal candidate for  catheter ablation.  I believe that the strategy should be to initiate  the patient back on amiodarone and ensure life-long anticoagulation with  Coumadin.  Once the patient has been treated with amiodarone 200 mg  twice daily for 4 weeks, we will attempt direct current cardioversion if  the patient remains in atrial fibrillation or atrial flutter.  The   patient is in agreement with this strategy.  He plans to be compliant  with both Coumadin and amiodarone therapy.  I have taken the liberty of  decreasing the patient's Coumadin to 4 mg daily rather than 4 mg  alternating with 6 mg as he has been initiated on amiodarone.  I have  also instructed the patient to follow up with [facility] to have an INR  obtained within 1 week.  Upon return to our clinic in 4 weeks, we will  obtain a thyroid  profile and liver profile upon initiation of amiodarone.  The patient  will continue to follow up with Dr. Gala Romney or Dr. Diona Browner for  routine management of his congestive heart failure.    Sincerely,      Hillis Range, MD  Electronically Signed    JA/MedQ  DD: 04/03/2008  DT: 04/04/2008  Job #: [id number]   CC:   Duke Salvia, MD, [facility]  Jonelle Sidle, MD  Onalee Hua  Tyrone Sage, M.D.

## 2010-11-18 NOTE — Discharge Summary (Signed)
[name], [name]   MEDICAL RECORD NO.:  [id number]          PATIENT TYPE:  INP   LOCATION:  2920                         FACILITY:  [facility]   PHYSICIAN:  Lonia Blood, M.D.DATE OF BIRTH:  September 17, 1945   DATE OF ADMISSION:  12/16/2007  DATE OF DISCHARGE:                               DISCHARGE SUMMARY   I have just dictated an interim discharge summary labeled job [id number].  I need to as that a cc of that dictation please be sent to Dr. Donia Guiles at [facility].  I may has asked for that, but I  am not clear that I did and therefore again, please cc job [id number] to  Dr. Donia Guiles.      Lonia Blood, M.D.  Electronically Signed     JTM/MEDQ  D:  12/26/2007  T:  12/26/2007  Job:  [id number]   cc:   Dineen Kid. Reche Dixon, M.D.

## 2010-11-18 NOTE — Discharge Summary (Signed)
[name], Richard NO.:  [id number]   MEDICAL RECORD NO.:  [id number]          PATIENT TYPE:  INP   LOCATION:  2023                         FACILITY:  [facility]   PHYSICIAN:  Salvatore Decent. Cornelius Moras, M.D. DATE OF BIRTH:  1945/10/26   DATE OF ADMISSION:  12/16/2007  DATE OF DISCHARGE:  01/24/2008                               DISCHARGE SUMMARY   ADDENDUM   On January 21, 2008, the patient had complaints of nauseousness and right-  sided lower abdominal pain.  He did have a bowel movement approximately  2 days prior.  He was afebrile.  Vital signs were stable.  His H&H did  decrease slightly (history of IDA and anemic postoperatively).  He also  did remain in aflutter with controlled ventricular rate.  He was on  Coumadin.  His PT and INR monitored daily.  His nauseousness did  resolve.  He did, however, have occasional pain on the right side of his  ribs and abdomen.  He did continue to improve such that by January 24, 2008.  His T-max of 99.1, later became afebrile.  His vital signs were  stable.  His O2 sat was 100% on room air.  His PT and INR were 22.1 and  1.8 respectively.   PHYSICAL EXAMINATION:  GENERAL:  On tele, again he remained in aflutter  with controlled ventricular rate.  LUNGS: Diminished at the right base, otherwise clear.  Incisions were clean and dry and continuing to heal.  EXTREMITIES:  Trace edema.   The patient was discharged on this date.   LABORATORY STUDIES:  CBC done on January 23, 2008, showed the H&H 8 and 24  respectively, white count 10,100, platelet count 722,000.  Last BMET  done on this day, potassium 4.1, BUN and creatinine were down to 20 and  1.97 respectively.  Last PT and INR was done January 24, 2008, and that was  only 22.1 and 1.8 respectively.   Medications as dictated previously.  The patient was discharged home on  Coumadin 2 mg p.o. daily or as directed as well as instructions to have  a home health nurse draw the PT and INR 48  hours after discharge.      Doree Fudge, PA      [name], M.D.  Electronically Signed    DZ/MEDQ  D:  03/15/2008  T:  03/16/2008  Job:  [id number]

## 2010-11-18 NOTE — Discharge Summary (Signed)
NAMEWASHINGTON, WHEDBEE NO.:  [id number]   MEDICAL RECORD NO.:  [id number]          PATIENT TYPE:  INP   LOCATION:  5148                         FACILITY:  [facility]   PHYSICIAN:  Theodosia Paling, MD    DATE OF BIRTH:  1946/06/10   DATE OF ADMISSION:  02/09/2008  DATE OF DISCHARGE:                               DISCHARGE SUMMARY   ADDENDUM:  Since February 14, 2008 the following issues have transpired:  1. Mitral valve replacement surgery.  The patient had subcapsular      hematoma secondary to Coumadin.  He recently had mitral valve      replacement done.  He did not develop any clotting after stopping      of Coumadin as well.  As per recommendation of cardiothoracic      surgery the patient has been on heparin and we are watching his      hemoglobin closely.  So far there is no evidence of activity.  2. Subcapsular hematoma.  The patient's hemoglobin has slowly drifted      down today.  If it goes down further tomorrow, a repeat ultrasound      of the abdomen can be done; however, for the most part it has been      almost stable and his vitals are also stable and his abdominal pain      has significantly improved as well.  General surgery is on board      for that.  3. Anemia.  The patient has a hemoglobin around 8.  In case his      hemoglobin drops less than 8, a stat CT scan of the abdomen can be      done.  Also heparin needs to be immediately stopped as stool occult      blood has been sent for that.  No indication for blood transfusion      right now as the hemoglobin is above 8.0.      Theodosia Paling, MD  Electronically Signed     NP/MEDQ  D:  02/21/2008  T:  02/22/2008  Job:  [id number]

## 2010-11-18 NOTE — Op Note (Signed)
[name], BREEDEN NO.:  [id number]   MEDICAL RECORD NO.:  [id number]         PATIENT TYPE:  CINP   LOCATION:                               FACILITY:  [facility]   PHYSICIAN:  Sheliah Plane, MD    DATE OF BIRTH:  Nov 29, 1945   DATE OF PROCEDURE:  01/16/2008  DATE OF DISCHARGE:                               OPERATIVE REPORT   PREOPERATIVE DIAGNOSES:  1. Right hemothorax, status post mitral valve replacement.  2. Right thoracotomy.   POSTOPERATIVE DIAGNOSES:  1. Right hemothorax, status post mitral valve replacement.  2. Right thoracotomy.   SURGICAL PROCEDURES:  1. Right video-assisted thoracoscopy.  2. Drainage of right hemothorax/pleural effusion.  3. Placement of chest tube.   SURGEON:  Sheliah Plane, MD   FIRST ASSISTANT:  Theda Belfast, [place]   BRIEF HISTORY:  The patient is a 65 year old male.  He has several weeks  previously had undergone mitral valve replacement through a right  thoracotomy incision by Dr. Cornelius Moras.  The patient initially did well but  ultimately developed some postoperative bleeding, which resolved.  Subsequently, he was doing well and then developed a right pleural  effusion on Coumadin.  With two attempts of thoracentesis, we removed  some fluid but not as much and with persistent changes in the chest x-  ray, a CT scan was done that showed a large right hemothorax and also  hematoma around the liver because the failure of previous two attempts  of drainage.  A left video-assisted thoracoscopy with drainage of  effusion was recommended.  The patient agreed and signed informed  consent.  Because of this mechanical mitral valve prosthesis, his INR  was allowed to decreased to 1.8.  and then we proceeded with surgery.   DESCRIPTION OF PROCEDURE:  With a central line and arterial line in  place, the patient underwent general endotracheal anesthesia without  incident.  A double-lumen endotracheal tube was placed in position and  confirmed with bronchoscope.  The patient was turned in lateral  decubitus position with the right side up.  The right lung was grasped  inferior to the initial incision and in anterior axillary line a small  incision was made.  Through this, the right chest was entered with a  videoscope.  The right chest was explored carefully.  The patient had  old liquefied bloody effusion present.  This was evacuated with  approximately 1300-1400 mL were removed.  The remainder of the chest was  then carefully explored.  No other further clots were noted.  The fluid  did not appear infected, however, cultures were obtained.  A 28-chest  tube was left in the video-assisted thoracoscopy tract and secured in  place.  The right  lung was reinflated.  The patient was awakened and extubated in the  operating room.  Blood loss was minimal.  He tolerated the procedure  without obvious complications.  Sponge and needle count was reported as  correct.  He was transferred to recovery room for postoperative care.      Sheliah Plane, MD  Electronically Signed  EG/MEDQ  D:  01/16/2008  T:  01/17/2008  Job:  [id number]

## 2010-11-18 NOTE — H&P (Signed)
[name], [name] NO.:  [id number]   MEDICAL RECORD NO.:  [id number]          PATIENT TYPE:  INP   LOCATION:  1406                         [facility]ACILITY[facility]   PHYSICIAN:  Lonia Blood, M.D.       DATE OF BIRTH:  [birth year]   DATE OF ADMISSION:  12/14/2007  DATE OF DISCHARGE:                              HISTORY & PHYSICAL   PRIMARY CARE PHYSICIAN:  [facility]   CHIEF COMPLAINT:  Chest pain.   HISTORY OF PRESENT ILLNESS:  [name] is a 65 year old African  American man with a history of moderate to severe mitral regurgitation  and congestive heart failure who presented today to the emergency room  complaining of right-sided chest pain.  He reports that the pain has  been fairly constant for the past 2 days, associated with some component  of worsening when he takes a deep breath.  The  pain is unrelated to  effort.  The patient also reports a dry hacking cough.  He has been  erratically taking his congestive heart failure medications.   PAST MEDICAL HISTORY:  1. Moderate to severe mitral regurgitation.  2. Congestive heart failure.  3. Microcytic anemia.  4. Chronic back pain.  5. Gastroesophageal reflux disease.   MEDICATION:  The patient was supposed to be on Aldactone, Coreg, Altace,  iron, vitamin B1, potassium, omeprazole and Vicodin.   ALLERGIES:  NO KNOWN DRUG ALLERGIES.   SOCIAL HISTORY:  Patient used to be a heavy alcohol drinker, but he quit  about 2 years ago. He does not smoke cigarettes and he has not smoked  any significant cigarettes throughout his life.  He used to work in a  [employer]. He is currently on disability from his back pain.   FAMILY HISTORY:  Mother died with coronary artery disease.  Father died  with gastrointestinal cancer.  The patient has eight siblings and out of  them there is a brother with throat cancer.   REVIEW OF SYSTEMS:  Are per HPI.  Also, the patient is complaining of  decreased  appetite.  He also has been complaining of increased swelling  in his legs.  Other systems are per HPI.  All other systems are reviewed  and negative.   PHYSICAL EXAM:  Upon admission, indicates temperature 97.9, heart rate  of  98, respiratory rate 22, blood pressure 102/50, saturation 96% on  room air.  GENERAL APPEARANCE:  An alert, oriented to place, person and time.  HEAD:  Normocephalic, atraumatic.  EYES: Pupils equal, round react to light accommodation.  Extraocular  movements intact.  MOUTH: The patient is quasi edentulous with significant paradontosis.  Throat is clear.  NECK: Neck has +3 JVD.  CHEST: Clear to auscultation bilaterally.  HEART: Is tachycardiac, regular, with 5/6 systolic murmur best heard at  the apex with radiation into the axilla.  ABDOMEN:  Is soft, nontender, nondistended, bowel sounds are present.  LOWER EXTREMITIES:  Have +1 edema, left greater than right.   LABORATORY VALUES ON ADMISSION:  Sodium is 138, potassium 3.8, chloride  101, glucose 110, BUN  7, creatinine 0.8, albumin 2.6. White blood cell  count is 13.9, hemoglobin 7.9, platelet count is 531.  BNP 657. A  portable chest x-ray indicates possible right middle lobe infiltrate,  cardiomegaly, vascular congestion.   ASSESSMENT AND PLAN:  1. Chest pain, unclear etiology.  I would proceed by placing the      patient on telemetry, cycle his cardiac enzymes, although my      suspicion for an acute myocardial infarction is low and obtain a CT      scan of the chest to rule out possibility of pulmonary embolus.      Also, given this patient's anorexia and leukocytosis and possible      infiltrate on chest x-ray, I will give him empiric Avelox, for      possible pneumonia.  2. Valvular cardiomyopathy with decompensation.  I will place Mr.      Hubbard on low-salt diet, fluid restriction in his Coreg and Altace      and continue to educate him about importance of proper medication      compliance to  control his symptoms of heart failure and mitral      regurgitation. It is unclear why the patient developed the mitral      regurgitation to begin with but definetly subacute endocarditis is      a matter of concern. [name] will have repeat echocardiogram      and blodd cultures drawn.  3. Anemia.  I am a little bit concerned about the fact that this      patient's anemia is microcytic. I have reviewed his past anemia      studies and they do not indicate just iron deficiency.      Nevertheless, given his age and a drop in hemoglobin,  I will      proceed with gastroenterology evaluation for consultation and      colonoscopy to rule out colon cancer.  I will repeat his anemia      panel and transfuse the patient 2 units of packed red blood cells.      Lonia Blood, M.D.  Electronically Signed     SL/MEDQ  D:  12/14/2007  T:  12/14/2007  Job:  [id number]   cc:   Melvern Banker  Fax: [phone]

## 2010-11-18 NOTE — Group Therapy Note (Signed)
NAMEGRYFFIN, ALTICE               ACCOUNT NO.:  [id number]   MEDICAL RECORD NO.:  [id number]          PATIENT TYPE:  INP   LOCATION:  5148                         FACILITY:  [facility]   PHYSICIAN:  Beckey Rutter, MD  DATE OF BIRTH:  1945/09/22                                 PROGRESS NOTE   This is an interim discharge summary.  Please refer to the previously  discharge summary by Dr. Theodosia Paling on February 21, 2008 and Dr.  Marthann Schiller on August 8.  For the last week, the patient remained  stable with no hepatic or intraabdominal problems.   1. Mitral valve replacement.  The patient remained on heparin      intravenously per pharmacy.  He had no increase in  the subcapsular      hepatic hematoma.  Now, the consensus is to start the patient back      on Coumadin, but to be very vigilant with the INR.  Please see the      surgical service note, Dr. Cornelius Moras, and also the cardiology note, Dr.      Gala Romney.  2. Anemia.  The patient's hemoglobin dropped to 8.5 with symptoms of      fatigue and lassitude.  The patient received 1 unit transfusion of      packed RBCs.  Now, he is stable.  His energy level is back to      baseline.  Current hemoglobin is 9.4.   PLAN:  The plan now is to get his INR therapeutic.  The plan afterwards will be  continuous and close monitoring for the INR.      Beckey Rutter, MD  Electronically Signed     EME/MEDQ  D:  02/28/2008  T:  02/28/2008  Job:  [id number]

## 2010-11-18 NOTE — H&P (Signed)
[name], DILORETO NO.:  [id number]   MEDICAL RECORD NO.:  [id number]          PATIENT TYPE:  INP   LOCATION:  0104                         FACILITY:  [facility]   PHYSICIAN:  Herbie Saxon, MDDATE OF BIRTH:  12-28-1945   DATE OF ADMISSION:  09/30/2007  DATE OF DISCHARGE:                              HISTORY & PHYSICAL   Patient has no primary care physician.  He is a full code.  Has no power  of attorney.   PRESENTING COMPLAINTS:  Shortness of breath, ankle swelling  for  one  month.   HISTORY OF PRESENTING COMPLAINT:  This is a 65 year old African-American  male with more than a 20-year history of heavy alcohol abuse.  He quit  one year ago.  He also has a history of chronic back pain.  He uses  Motrin on a regular basis.  He has also been told he has a heart murmur  at age 36.  He was quite well until a month ago when he started noticing  increasing shortness of breath on mild exertion, which has been  progressively worsening.  Associated with increased paroxysmal nocturnal  dyspnea, orthopnea, and gradual pedal swelling.  Patient also noted  intermittent retrosternal chest discomfort, cough productive of whitish  foamy sputum.  No hemoptysis.  No nausea, vomiting.  He has intermittent  diarrhea.  Patient also noticed intermittent dark stools for the last  one month.  No frank hematochezia.  He denies any abdominal pain.  He  does have nocturia.  There is no frequency of urine or dysuria.  No new  skin rash or joint swelling.  No syncopal episode.  His motor has been  stable.   PAST MEDICAL HISTORY:  As stated above.   FAMILY HISTORY:  Not known or contributory.   SOCIAL HISTORY:  He used to be a Merchandiser, retail at the [employer].  Positive history of heavy alcohol abuse.  He used to drink one-fifth of  gin on a daily basis for more than 20 years.  No history of tobacco or  illicit drug abuse.  He is divorced.  He has 11 children.   MEDICATIONS:   Ibuprofen as necessary.   ALLERGIES:  No known drug allergies.   SURGICAL HISTORY:  None.   REVIEW OF SYSTEMS:  Fourteen systems reviewed.  Pertinent positives as  in the history of presenting complaints.   PHYSICAL EXAMINATION:  An elderly man.  Temperature 98, pulse 94, respiratory rate 20, blood pressure 145/82.  In mild respiratory distress.  NECK:  Supple.  There is elevated jugular venous distention.  There is  no carotid bruit .  Pupils are equal and reactive to light and accommodation.  Oropharynx  and nasopharynx are clear.  Extraocular muscles are intact.  There is no  thyromegaly.  No carotid bruit.  Heart sounds 1, 2, and 3 with a 3/6 systolic murmur at the apex.  ABDOMEN:  Soft.  Mild left renal angle tenderness.  No organomegaly.  Hernia orifices are patent.  He is alert and oriented to time, place, and person.  Power is 5  globally.  Deep tendon reflexes are 2+ globally.  He has pedal edema.  It is patent.  No cyanosis, no clubbing.  No  jaundice.   Available labs show that the WBC is 8.5, hematocrit 29, platelet count  385, MCV 77, INR 1.2, PTT 41.  Chemistries:  Sodium 143, potassium 2.8,  chloride 107, bicarbonate 29, glucose 111, BUN 10, creatinine 1, calcium  8.7, AST 13, ALT 9.  Troponin I less than 0.05.  BNP 668.   EKG shows sinus tachycardia at 105 per minute plus PVCs.  Left atrial  hypertrophy, inferior lateral lead T wave inversion, ST depression in  the lateral leads.   Chest x-ray shows mild cardiomegaly, pulmonary vascular congestion, and  small pleural effusions.   ASSESSMENT:  Congestive heart failure decompensation, likely secondary  to dilated or ischemic cardiomyopathy.  Rule out acute coronary syndrome.  Small pleural effusions.  Sinus tachycardia.  Alcohol abuse.  Query occult gastrointestinal bleed with nonsteroidal anti-inflammatory  abuse.  anemia.  h  ypokalemia.   Patient is to be admitted to a telemetry bed.  We will start him  on IV  Lasix 40 mg q.8h.  Supplement his potassium IV and p.o.  put him on  nitrates, beta blocker, and ACE-I,NTG drip  IV q.8h. p.r.n. chest pain.  Will seek a 2D echocardiogram and subsequent cardiology evaluation.  Will type and cross 3 units of packed red blood cells and transfuse him  if his hematocrit drops below 25.  Duonebs q.6h. for shortness of  breath.  SCDs for DVT prophylaxis until hemoccult x3 is negative.  Will  check his anemia workup.  Monitor his H&H q.12h.  Cardiac enzymes and  EKG q.8h. x3.  Will also review his thyroid function tests, fasting  lipids, homocysteine, and electrolytes, calcium magnesium, phosphate.  IV hep-lock.  Diet will be heart healthy, low cholesterol.  Strict  input/output, daily weights, Foley catheterization.  O2 2-5 ml via nasal  cannula continuous to keep his sats greater than 90%.  Total abstinence  of alcohol.  Protonix 40 mg IV daily.   Patient is given his medication, tests, and treatment plan, discussed  with him and his family.  They vocalize understanding.      Herbie Saxon, MD  Electronically Signed     MIO/MEDQ  D:  09/30/2007  T:  09/30/2007  Job:  [id number]

## 2010-11-18 NOTE — Discharge Summary (Signed)
[name], [name]               ACCOUNT NO.:  [id number]   MEDICAL RECORD NO.:  [id number]          PATIENT TYPE:  INP   LOCATION:  1432                         [facility]AC[facility]   PHYSICIAN:  Herbie Saxon, MDDATE OF BIRTH:  05/01/1946   DATE OF ADMISSION:  09/30/2007  DATE OF DISCHARGE:  10/03/2007                               DISCHARGE SUMMARY   DISCHARGE DIAGNOSES:  1. Congestive heart failure decompensation, likely diabetic      cardiomyopathy.  2. Tachycardia, resolved.  3. History of chronic alcohol abuse.  4. Occult gastrointestinal bleed, stable.  5. Nonsteroidal anti-inflammatory drug abuse.  6. Microcytic anemia.  7. Hypokalemia, resolved.  8. No medical insurance coverage.  9. Degenerative joint disease with chronic back pain.  10.History of heart murmur.   RADIOLOGY:  1. The chest x-ray of October 01, 2007, shows some increase in small      bilateral pleural effusion, cardiomyopathy and persistent vascular      congestion, mild left basilar atelectasis.  2. Chest x-ray on September 30, 2007, shows the mild pulmonary congestion      and small pleural effusions.   HOSPITAL COURSE:  This 65 year old African American male presented to  the emergency room with dyspnea on exertion and ankle swelling which has  been increasingly worsening over the last 1 month.  He has also noticed  dark melena stool for the same interval.  He denies any nausea,  vomiting, hematemesis.  He does give a history of heavy alcohol abuse.  At presentation he had chest x-ray suggestive of pulmonary edema.  He  also had microcytic anemia and hypokalemia.  Hypokalemia has been  repleted.  The patient's Hemoccult is negative so far and the melena  stool has subsided. His NSAIDs have been discontinued.  He was started  on IV Lasix for diuresis, started on beta blocker and ACE inhibitor.  Counseled on need for alcohol abstinence and also had iron sulfate to  his medical regimen with thiamine.   Echocardiogram and lipid panel  pending.  Patient is much clinically improved.  A BNP today is 127; BNP  on admission was 688.  He has mild hypokalemia which will be repleted  prior to discharge.  Social worker input has been sought to assist him  with Medicaid coverage.   DISCHARGE CONDITION:  Stable.   DIET:  Low sodium, heart-healthy, low cholesterol.   ACTIVITY:  To be increased slowly as tolerated.   FOLLOWUP:  1. Follow up with physician at the [facility] in 3-5 days.  2. Follow up with [facility] in 3-4 weeks as necessary, to be      arranged by the primary care physician.   DISCHARGE MEDICATIONS:  1. Plavix 75 mg daily.  This is to be held if there are melena stools      or frank GI bleed.  2. Lasix 40 mg daily.  3. K-Dur 20 mEq daily.  4. Coreg 3.125 mg 1 b.i.d.  5. Altace 2.5 mg daily.  6. Aldactone 50 mg daily.  7. Ferrous sulfate 325 mg b.i.d.  8. Prevacid 20  mg b.i.d.  9. Percocet 5/325 mg 1 q.6 h p.r.n.   His echocardiogram is to be followed up as outpatient and lipid panel  also to be done as outpatient in a.m.   PHYSICAL EXAMINATION:  GENERAL:  He is an elderly man, not in acute  respiratory distress.  HEENT:  Pupils are equal, reactive to light and accommodation.  Pale.  Not jaundice.  There is no cyanosis or clubbing.  Head is atraumatic,  normocephalic.  Oropharynx and nasopharynx are clear.  NECK:  Supple.  There is no elevated JVD.  No thyromegaly or carotid  bruit.  HEART:  Heart sounds 1, 2 and 3 with a 3/6 systolic murmur at the apex.  ABDOMEN:  Benign.  NEUROLOGIC:  He is alert and oriented to time, place and person.  Cranial nerves 2-12 intact.  Power 5 globally.  Deep tendon reflexes 2+  globally.  EXTREMITIES:  Peripheral pulses present.  Trace pedal edema.   LABORATORY:  The labs showed the potassium level prior to  supplementation is 3.2, sodium 140, chloride 98, bicarbonate 31, glucose  95, BUN 9, creatinine 1.  WBCs 10,  hematocrit 27, platelet count 352.  Cardiac BNP 127.  The iron level is low at 12, TIBC 205.  Cardiac  enzymes x3 negative.   Chest x-ray should be repeated in 1 week as outpatient.  His 2-D  echocardiogram is to be ordered by primary care physician as outpatient.   He is to be discharged home today after reviewing his potassium level  and be sent home if potassium level greater than 3.5.      Herbie Saxon, MD  Electronically Signed     MIO/MEDQ  D:  10/03/2007  T:  10/03/2007  Job:  [id number]

## 2010-11-18 NOTE — Assessment & Plan Note (Signed)
[facility]                            CARDIOLOGY OFFICE NOTE   NAME:[name], [name]                      MRN:          [id number]  DATE:04/12/2008                            DOB:          08/14/1945    INTERVAL HISTORY:  [name] is a 65 year old male with a history of  mitral valvular endocarditis.  He is status post St. Jude mechanical  valve replacement on December 26, 2007.  Postoperative course was  complicated by recurrent right hemothorax and pleural effusion requiring  chest tube.  He also had pancreatitis, volume overload, and anemia.  His  past medical history is also notable for a history of alcoholic  cirrhosis.  Ejection fraction has been in the 50% range.   He saw Wende Bushy at the end of July.  He was mildly volume  overloaded.  His Lasix was adjusted, then he was referred to Dr. Johney Frame  for further evaluation of atrial flutter and then possible catheter  ablation.  Dr. Johney Frame suggested a trial of amiodarone and cardioversion.   From a functional point of view, he is doing quite well.  He does have  some lower extremity edema, but no orthopnea, no PND, no chest pain, no  fevers, or chills.  He really feels like he is getting around well.  He  does not have any palpitations, syncope, or presyncope.  He has been  taking his Coumadin, although his INR has been running a bit low.  He  has not had any bleeding.   CURRENT MEDICATIONS:  1. Metoprolol 12.5 b.i.d.  2. Prilosec 20 b.i.d.  3. Potassium 20 b.i.d.  4. Lasix 20 mEq Monday, Wednesday, and Friday.  5. Amiodarone 200 b.i.d.  6. Coumadin.  7. Aspirin 81.   PHYSICAL EXAMINATION:  GENERAL:  He is in no acute distress, ambulates  around the clinic without any respiratory difficulty.  VITAL SIGNS:  Blood pressure is 110/60, heart rate is 70, weight is 170.  HEENT:  Normal.  NECK:  Supple.  JVP is about 7 cm of water with prominent CV waves.  There is no lymphadenopathy or  thyromegaly.  CARDIAC:  PMI is nondisplaced.  He is irregular with a mechanical S1, no  obvious murmurs.  LUNGS:  Clear.  ABDOMEN:  Soft, nontender, nondistended.  There is no  hepatosplenomegaly, no bruits, no masses.  Good bowel sounds.  EXTREMITIES:  Warm with no cyanosis or clubbing.  There is 2+ edema from  the calf down bilaterally.  No rash.  NEURO:  Alert and oriented x3.  Cranial nerves II through XII are  intact.  Moves all 4 without difficulty.  Affect is pleasant.   ASSESSMENT/PLAN:  1. History of mitral valve endocarditis status post mechanical mitral      valve replacement.  This is stable.  2. Postoperative atrial flutter, recently started on amiodarone.  3. History of cirrhosis.  4. Lower extremity edema.   PLAN:  We will bring him to the hospital next week for TEE-guided  cardioversion given his subtherapeutic INRs.  We will treat him with  amiodarone for several  months and discontinuing CP, can hold sinus  rhythm on his own, if not, then we may need to consider catheter  ablation.  We will increase his Lasix to 20 mEq every day due to his  swelling and recheck his electrolytes next week.  He will need  amiodarone surveillance laboratories in the near future.  I reinforced  the need for him to watch his INRs closely with his mechanical mitral  valve given the risk of stroke.     Bevelyn Buckles. Bensimhon, MD  Electronically Signed    DRB/MedQ  DD: 04/12/2008  DT: 04/12/2008  Job #: [id number]

## 2010-11-18 NOTE — Assessment & Plan Note (Signed)
[facility]                            CARDIOLOGY OFFICE NOTE   NAME:[name], [name]                      MRN:          [id number]  DATE:02/01/2008                            DOB:          19-Sep-1945    PRIMARY CARDIOLOGIST:  Bevelyn Buckles. Bensimhon, MD   SURGEON:  Salvatore Decent. Cornelius Moras, MD   This is a 65 year old African American male patient who was admitted to  the hospital with bacterial endocarditis with severe MR.  He underwent  right anterior thoracotomy for mitral valve replacement with a St.  Jude's mechanical prosthesis on December 26, 2007.  He also had a right  thoracentesis by Dr. Cornelius Moras on January 05, 2008, as well as January 12, 2008, by  Interventional Radiology.  He then had a right video-assisted  thoracoscopic surgery with drainage of the right hemothorax and pleural  effusion with placement of a chest tube by Dr. Tyrone Sage on January 16, 2008.  He also had acute-on-chronic blood loss anemia and volume  overload.   Since he has been home, he complains of no appetite and foods having  metallic taste and not tasting while.  He does have a significant amount  of edema.  He admits to waking up short of breath about once a night,  but this eases was sitting at the bedside for a few minutes and then he  can lay back down.  He just had his PICC line pulled yesterday and was  able to stop the vancomycin and IV Rocephin that he was receiving.  He  is on amiodarone for postop atrial fibrillation and flutter.   CURRENT MEDICATIONS:  1. Lasix 40 mg daily.  2. K-Dur 20 mEq daily.  3. Digoxin 0.125 mg daily.  4. Coumadin as directed.  5. Amiodarone 200 mg daily.  6. Aspirin 81 mg daily.   PHYSICAL EXAMINATION:  GENERAL:  This is a very pleasant 65 year old  African American male in no acute distress.  VITAL SIGNS:  Blood pressure 114/64, pulse 66, and weight 134.  NECK:  Increased JVP.  No HJR, bruit, or thyroid enlargement.  LUNGS:  Decreased breath sounds  throughout, especially at the right base  with fine crackles bilaterally.  HEART:  Irregularly irregular at 66 beats per minute.  Normal S1 and S2  with a crisp valvular click.  ABDOMEN:  Soft without organomegaly, masses, lesions, or abnormal  tenderness.  EXTREMITIES:  Pitting edema +2-3 bilaterally.   EKG:  Atrial flutter with 4:1 AV conduction, nonspecific ST-T wave  changes.   IMPRESSION:  1. Shortness of breath and lower extremity edema.  2. Bacterial endocarditis with severe mitral regurgitation, treated      with right mini thoracotomy for mitral valve replacement using a      St. Jude mechanical prosthesis on December 26, 2007.  3. Status post right thoracentesis x2 followed by video-assisted      thoracoscopic surgery and drainage of the right hemithorax and      pleural effusion and placement of chest tube on January 16, 2008.  4. Postoperative atrial fibrillation and flutter.  5.  Gastroesophageal reflux disease.  6. Remote history of heavy alcohol abuse.  7. Chronic low back pain, on disability.  A 2-D echo postop showed      abnormal septal motion.  The left ventricle was mildly dilated,      ejection fraction was 50%, left ventricular thickness was mildly      increased, aortic valve was mildly calcified with trivial aortic      valve regurgitation.  It was a normal-appearing prosthetic valve      with only mild periprosthetic leak.   PLAN:  We will check a BMET and BNP today.  I have increased his Lasix  to 40 mg b.i.d. and Klor-Con 20 mEq b.i.d. for a week to see if we can  reduce the swelling.  He is to see Dr. Cornelius Moras on February 13, 2008; and we  will have him see Dr. Gala Romney back in 1 month's time or sooner if  needed.      Jacolyn Reedy, PA-C  Electronically Signed      Madolyn Frieze. Jens Som, MD, [facility]  Electronically Signed   ML/MedQ  DD: 02/01/2008  DT: 02/02/2008  Job #: [id number]

## 2010-11-18 NOTE — Assessment & Plan Note (Signed)
[facility]                            CARDIOLOGY OFFICE NOTE   NAME:Brekke, TRAEVON MEIRING                      MRN:          [id number]  DATE:03/21/2008                            DOB:          1945/12/17    Mr. Cutright is a patient previously seen by Dr. Gala Romney and Dr.  Diona Browner.  I saw him today at the request [facility] for increasing  lower extremity edema.   He is status post prosthetic mitral valve replacement.  He was  hospitalized for anemia and what appears to be a perihepatic hematoma.  I suspect the hematoma etiology was through a previous thoracentesis for  a right pleural effusion.   He has been followed by GI.  He is back on his Coumadin.  He is having  increasing lower extremity edema.  His diet is somewhat poor.  He does  not watch salt intake.  He says he has been compliant with his  medications.  He is only taking 40 a Lasix once a day with no potassium  supplementation.  He gets his Coumadin level checked at [facility].   REVIEW OF SYSTEMS:  Remarkable for no TIA or bleeding diathesis.  No  chest pain.  No PND or orthopnea.  Primarily, increased lower extremity  edema.  He does not weigh himself on a regular basis.   MEDICATIONS:  1. Metoprolol 25 b.i.d.  2. Omeprazole 20 b.i.d.  3. Vitamin B.  4. Lasix 40 a day, to be increased to 60 b.i.d.  5. Coumadin as directed.  6. Baby aspirin a day.  7. Iron.   PHYSICAL EXAMINATION:  GENERAL:  Remarkable for an elderly black male in  no distress.  VITAL SIGNS:  His weight is 169 in May 2009.  He was 157 when Dr.  Diona Browner saw him.  He subsequently was 134 in his initial post-hospital  visit, and now he is at 169.   His pulse is slightly irregular at a rate of 80.  His rhythm appears to  be atrial flutter.   Blood pressure is 120/72, he needs aflutter ablation and  respiratory  rate 14.  HEENT:  Unremarkable.  NECK:  Carotids normal without bruit.  No lymphadenopathy or  thyromegaly.  JVP is elevated with a V-wave.  LUNGS:  Currently clear.  CARDIAC:  There is an S1 click with an S2.  There is no diastolic  murmur.  PMI is mildly increased.  ABDOMEN:  Protuberant.  There is no hepatojugular reflux.  No  hepatosplenomegaly.  No ascites.  EXTREMITIES:  Distal pulses are intact with +2-3 edema bilaterally.  NEURO:  Nonfocal.  SKIN:  Warm and dry.  MUSCULOSKELETAL:  No muscular weakness.   EKG shows atrial flutter with rate of 88.   IMPRESSION:  1. Increasing lower extremity edema.  We will increase Lasix.  The      patient needs to decrease his salt intake.  We will put him on 60      b.i.d. with potassium supplementation and check a BMET.  2. Atrial flutter status post mitral valve surgery.  Since I was not  involved with the patient's initial care, I do not know whether he      had a maze procedure or not.  We will refer him to EP for      possibility of followup flutter ablation.  3. Perihepatic hematoma status post mitral valve surgery, likely      secondary to thoracentesis needle.  Followup per GI.  We will      likely need to follow up CAT scanning.  4. Anticoagulation.  Follow up with [facility].  The patient      apparently getting his protime checked every 4 weeks or so.  5. Mitral valve replacement, valve sounds normal.  Should probably      have a followup echocardiogram for baseline function, sometime in      the next 6 months.   The patient will follow up with Dr. Diona Browner or Dr. Gala Romney in 4 weeks  and also see EP.     Noralyn Pick. Eden Emms, MD, [facility]  Electronically Signed    PCN/MedQ  DD: 03/21/2008  DT: 03/21/2008  Job #: [id number]

## 2010-11-18 NOTE — Discharge Summary (Signed)
[name], [name]   MEDICAL RECORD NO.:  [id number]          PATIENT TYPE:  INP   LOCATION:  2023                         FACILITY:  [facility]   PHYSICIAN:  [name], M.D. DATE OF BIRTH:  May 07, 1946   DATE OF ADMISSION:  12/16/2007  DATE OF DISCHARGE:                               DISCHARGE SUMMARY   Please see interim discharge summary, which was dictated on December 26, 2007, dictated by Dr. Sharon Hubbard.   PRIMARY DIAGNOSIS:  Bacterial endocarditis with severe mitral  regurgitation.   SECONDARY DIAGNOSES:  1. Acute on chronic blood loss anemia postoperatively.  2. Volume overload.  3. Right pleural effusion.  4. Atrial fibrillation/flutter postoperatively.   SECONDARY DIAGNOSES:  1. Gastroesophageal reflux disease.  2. Remote history of heavy alcohol abuse.  3. History of working in [employer].  4. Chronic low back pain, on disability.  5. Microcytic anemia.  6. Congestive heart failure.   PROCEDURES:  Post December 26, 2007,  1. Right mini-thoracotomy for mitral valve replacement using [employer] Jude      mechanical prosthesis.  2. Status post right thoracentesis January 05, 2008, done by Dr. Cornelius Hubbard.  3. Status post right thoracentesis done by interventional radiology a      January 12, 2008.  4. Status post right video-assisted thoracoscopic surgery, drainage of      right hemothorax/pleural effusion with placement of chest tube done      by Dr. Tyrone Hubbard, January 16, 2008.   Please see the dictated discharge summary for the patient's history as  well as preoperative course.   The patient was taken to the operating room by Dr. Cornelius Hubbard on December 27, 2007, where he underwent right mini-thoracotomy for mitral valve  replacement using a [employer]. Jude mechanical prosthesis valve.  The patient  was able to tolerate this procedure well and transferred to the  intensive care unit in stable condition.  Postoperatively, the patient  was noted to be hemodynamically  stable.  Postoperatively, the patient  had multiple issues.  He was able to be extubated the evening of  surgery.  Post extubation, the patient was noted to be alert and  orientated x4.  Neuro intact.  Post extubation, the patient was  maintaining O2 sats greater than 90% on 2 L.  Daily chest x-rays  obtained.  The patient's pleural tube remained in place for several days  secondary to drainage.  This was slowing down and able to be  discontinued on postop day #4.  This was based on chest x-ray remaining  stable with no air leak and minimal drainage.  The patient was  encouraged to use his incentive spirometer.  Repeat chest x-rays  obtained for evaluation, as the patient was complaining of some  shortness of breath requiring to be placed back on oxygen, this showed a  right pleural effusion.  The patient's Coumadin was held for right  thoracentesis.  This was done by Dr. Cornelius Hubbard on January 05, 2008.  Approximately 1000 mL of thin dark serosanguineous fluid was evacuated.  The patient tolerated this well.  A  followup chest x-ray showed no  pneumothorax, a small residual right effusion and stable right lower  lobe atelectasis.  Pleural fluid was sent for Gram stain and showed no  organisms.  The patient did have some pain at the right thoracentesis  site following.  He was also noted to be anemic post effusion with  hemoglobin/hematocrit dropping to 7.3 and 21%.  These results were done  on January 06, 2008.  The patient received several units of packed red blood  cells.  The patient's hemoglobin/hematocrit were followed and he  continued to drop.  He received several more transfusions.  Repeat chest  x-ray showed recurrent right pleural effusion.  The patient will require  a second right thoracentesis.  Coumadin was again held.  The patient was  sent to interventional radiology on January 12, 2008, where they removed 700  mL of bloody fluid.  They did notice a small amount of echogenic  material  surrounding the liver, which could represent blood and  recommended a CT scan for further evaluation.  CT scan was ordered and  showed a large right effusion vs. possible hemothorax, as well as some  blood around the patient's perihepatic area.  Due to the patient's  persistent anemia and requiring O2 to maintain sats greater than 90% and  also large right hemothorax, it was recommended the patient undergo  right video-assisted thoracoscopic surgery for evacuation of hemothorax.  This was discussed by Dr. Tyrone Hubbard with the patient.  Dr. Tyrone Hubbard  discussed risks and benefits of the procedure.  The patient's Coumadin  was again held.  He was scheduled for surgery on January 16, 2008 pending  patient's INR stable.  On January 16, 2008, the patient's INR dropped to  1.8 and he was taken to the operating room.  He underwent right video-  assisted thoracoscopic surgery with drainage of right hemothorax of  about 1300 mL with placement of 20-French chest tube.  The patient  tolerated this procedure and was transferred to 3300 in stable  condition.  Postoperatively, the patient was noted to be hemodynamically  stable.  Daily chest x-rays obtained showing no pneumothorax with small  right effusion and atelectasis.  The patient had no pneumothorax noted.  He had minimal drainage from his chest tubes.  The patient's chest tube  was able to be removed on January 18, 2008, without difficulty.  Repeat  chest x-rays were stable.  The patient was able to be weaned off oxygen  with sats greater than 90% on room air.  He remained hemodynamically  stable.  We will repeat a CBC prior to discharge.   From cardiac standpoint, postoperatively, the patient was in normal  sinus rhythm A pacing.  He was started on low-dose beta blocker.  He was  able to be removed from A pacing and was tolerating a normal sinus  rhythm on his own.  He was having some first-degree AV block  postoperatively, which turned into dropped beats  with second-degree  block at time.  The patient's beta-blocker was discontinued at that  time.  Unfortunately, the patient went into atrial fibrillation and he  was started on amiodarone.  Cardiology was following postoperatively.  The patient was restarted on low-dose beta-blocker as well as digoxin.  He went from atrial fibrillation to rate controlled atrial flutter.  The  patient is on Coumadin for his mitral valve replacement.  It was  adjusted appropriately to meet therapeutic level.  Cardiology continued  to follow and the patient  remained in persistent atrial flutter.  They  planned to follow up as an outpatient and possible cardioversion.  We  will plan to keep the patient on amiodarone and digoxin, but they did  discontinue Lopressor prior to discharge.   The patient was continued on IV antibiotics postoperatively.  Infectious  Disease was following.  They recommended continuing the patient on IV  vancomycin and Rocephin for a total 42 days.  They did have the patient  on gentamicin and initially postoperatively, the patient's creatinine  was increasing and gentamicin was discontinued.  Creatinine returned  back to baseline.  The patient was continued on the IV vancomycin and  Rocephin and was eventually started on Diflucan p.o. for yeast in urine.  Infectious Disease will plan to follow up with the patient post  discharge.  Home health has been arranged for administration of the  patient's IV antibiotics and vancomycin will be dosed based per  pharmacy.   Postoperatively, the patient did have some acute on chronic blood loss  anemia.  He did have pre first thoracentesis and required blood  transfusions.  Again as stated above, he did become anemic status post  first thoracentesis requiring several more transfusions.  This  stabilized over time.  Last hematocrit was 27.2 on January 17, 2008.  We  will check a CBC in a.m..  The patient had been restarted on his  Coumadin and daily  PT/INRs were obtained.  Currently, the patient is  very sensitive to Coumadin and his INR increases quickly only on 1 mg.  We will continue to adjust as needed and plan to have the patient have a  PT/INR done by home health nurse on January 23, 2008 and fax results to Dr.  Ival Bible office for management.  The patient also has mild volume  overload requiring initiating on Lasix.  Daily weights were obtained.  The patient's still has some peripheral edema in bilateral lower  extremities and this is monitored.  We will plan to continue diuretics  at home.  Postoperatively, the patient was out of to bed ambulating with  cardiac rehab.  He continued to progress well.  The patient was also  tolerating diet well.  No nausea or vomiting noted.  All incisions were  clean, dry, intact and healing well.   The patient is tentatively ready for discharge home in the a.m. January 21, 2008, pending everything remains stable and has been arranged for home.  CBC and BMP are ordered for evaluation in the a.m. prior to discharge.  The patient noted to be afebrile on January 20, 2008, and atrial flutter  with a heart rate of 77.  Blood pressure stable.  O2 sats 92% on room  air.  Chest x-ray showed stable small right effusion.   FOLLOWUP APPOINTMENTS:  A followup appointment has been arranged with  Dr. Cornelius Hubbard for February 13, 2008, at 12:10 p.m..  The patient will need to  obtain PMI chest x-ray 30 minutes prior to this appointment.  The  patient will need to follow up with Dr. Diona Browner in 2 weeks.  He will  need to contact this office to make these arrangements.  Home health  nurses to draw PT/INR on January 23, 2008, and fax results to Dr.  Ival Bible office.   ACTIVITY:  The patient instructed no driving which he agrees to do so,  no lifting over 10 pounds.  He is told to ambulate 3-4 times per day  progress as tolerated and to  continue his breathing exercises.   DIET:  The patient educated on diet to be low-fat,  low-salt.   DISCHARGE MEDICATIONS:  1. Aspirin 81 mg daily.  2. Vancomycin IV per pharmacy, last dose is January 26, 2008.  3. Rocephin 1 g IV q.24 h, last dose is January 28, 2008.  4. Lasix 40 mg daily.  5. Potassium chloride 20 mEq daily.  6. Digoxin 0.125 mg daily.  7. Coumadin will be dosed based on the patient's discharge PT/INR      level.  8. Diflucan 400 mg daily per ID.  9. Amiodarone 200 mg daily.  10.Oxycodone 5 mg 1-2 tablets q. for 4-6 hours p.r.n.      Richard Belfast, PA      [name], M.D.  Electronically Signed    KMD/MEDQ  D:  01/20/2008  T:  01/21/2008  Job:  [id number]   cc:   [name], M.D.  Richard Sidle, [name]  Richard Frieze. Jens Som, [name], [facility]

## 2010-11-18 NOTE — Assessment & Plan Note (Signed)
[name]                            CARDIOLOGY OFFICE NOTE   NAME:[name], [name]                      MRN:          [id number]  DATE:11/11/2007                            DOB:          1945/07/15    PRIMARY CARE:  [facility].   REASON FOR VISIT:  Post hospital follow-up.   HISTORY OF PRESENT ILLNESS:  This is my first meeting with [name].  He is a 65 year old male admitted to the hospitalist service back in  March of this year with progressive shortness of breath, ankle edema and  a diagnosis of congestive heart failure.  He had a prior history of  longstanding alcohol abuse, although reportedly not recently, and a  longstanding history of heart murmur.  He was treated with diuretic  therapy and placed on a basic medical regimen with general improvement  in symptoms.  He underwent an echocardiogram during his hospital stay  that demonstrated a left ventricular ejection fraction of 50-55% with  left ventricular dilatation (end-diastolic dimension of 60 mm) and a  moderately-thickened mitral valve (anterior leaflet more than posterior  leaflet) with mild mitral valve prolapse associated with moderate to  severe mitral regurgitation.  This was described as being eccentric and  directed anteriorly.  Right ventricular function was described as normal  and pulmonary artery systolic pressure was not increased.  Cardiology  did not see the patient during his hospital stay as best I can  ascertain.  He was scheduled to come see [place] in follow-up as an  outpatient.   In speaking with [name] now, he states that he continues to abstain  from alcohol use.  He reports intermittent compliance with medications.  He apparently was seen in the emergency department a few days ago with  reflux symptoms and had actually not been taking his medicines for the  last several days.  His medical regimen is outlined below.  He does  report orthopnea, which  occurs fairly regularly and has been a problem  for the last several months.  He is not having any chest pain or  palpitations.  His electrocardiogram shows sinus tachycardia with  premature ventricular complexes.  He states that his appetite is fair.  He has trace lower extremity edema and has no frank PND.  I spoke with  the patient and his wife about the echocardiographic findings and the  likelihood that this has been a progressive valvular problem given his  longstanding history of heart murmur.  This may well be a situation  that progresses to a valve replacement or repair, and I made this clear  to him today.  I also underscored the importance of medication  compliance and follow-up.   ALLERGIES:  No known drug allergies.   MEDICATIONS:  1. Lasix 40 mg p.o. daily.  2. K-Dur 20 mg p.o. daily.  3. Coreg 3.25 mg p.o. b.i.d.  4. Altace 2.5 mg p.o. daily.  5. Aldactone 50 mg p.o. daily.  6. Ferrous sulfate 325 mg p.o. b.i.d.  7. Vitamin B.  8. Omeprazole 20 mg p.o. daily.  9. He also  uses hydrocodone p.r.n.   REVIEW OF SYSTEMS:  As outlined above.  He has chronic problems with  back pain.  Otherwise negative.   EXAMINATION:  Heart rate down into the 90s after being seated for  several minutes, weight is 157 pounds.  Blood pressure 137/67.  He is a somewhat chronically ill-appearing male.  No acute distress or  breathlessness at rest.  HEENT:  Conjunctivae and lids normal.  Oropharynx clear with poor  dentition.  NECK:  Supple.  No elevated jugular venous pressure.  No loud bruits.  LUNGS:  Clear.  Diminished breath sounds at the bases.  CARDIAC:  A regular rate and rhythm with a prominent midsystolic click  heard toward the lateral chest and apex with a late systolic murmur that  is 3/6.  The PMI is displaced.  No pericardial rub or S3 gallop is  evident.  ABDOMEN:  Soft, nontender.  EXTREMITIES:  Trace edema, nonpitting.  Distal pulses are 2+.  SKIN:  Warm and dry.   MUSCULOSKELETAL:  No kyphosis is noted.  NEUROPSYCHIATRIC:  The patient alert and oriented x3.  Affect seems  appropriate.   IMPRESSION AND RECOMMENDATIONS:  Recent findings of mitral valve  prolapse with valve thickening associated with eccentric moderate to  severe mitral regurgitation resulting in clinical congestive heart  failure in the setting of a low normal ejection fraction of 50-55% and  left ventricular dilatation.  He reports a longstanding history of  heart murmur and I suspect that he had progressive mitral  regurgitation over years, culminating in symptomatic heart failure over  the last several months.  He has not been entirely compliant with his  medications.  I underscored the importance of medication compliance with  the patient and his wife today.  I have recommended that we check a BMET  and BNP to follow up on his renal function and volume status in general.  He will need a follow-up echocardiogram over the next few months on  reasonable medical therapy to reassess his mitral regurgitation.  I  suspect he may well come to a mitral valve replacement/repair,  particularly he does not improve symptomatically.  He is to continue to  follow with [facility], and we will plan to see him back around the  time of his echocardiogram.  I did advance his Coreg to 6.25 mg twice  daily today but otherwise kept his medications the same.     Jonelle Sidle, MD  Electronically Signed    SGM/MedQ  DD: 11/11/2007  DT: 11/11/2007  Job #: [id number]

## 2010-11-18 NOTE — Discharge Summary (Signed)
[name], [name] NO.:  [id number]   MEDICAL RECORD NO.:  [id number]          PATIENT TYPE:  INP   LOCATION:  2008                         FACILITY:  [facility]   PHYSICIAN:  Altha Harm, MDDATE OF BIRTH:  1945/12/17   DATE OF ADMISSION:  02/09/2008  DATE OF DISCHARGE:                               DISCHARGE SUMMARY   INTERIM DISCHARGE SUMMARY:   FINAL DISCHARGE DIAGNOSES:  1. Hematuria.  2. Perihepatic hematoma.  3. Acute blood loss anemia.  4. Chronic anemia.  5. History of atrial flutter.  6. Possible torsades de pointes arrhythmia.  7. Chronic kidney disease, stage III, stable.  8. Constipation, improved.  9. Hyperbilirubinemia.  10.Status post mitral valve replacement, St. Jude's type valve.  11.History of chronic back pain.  12.History of congestive heart failure.  13.History of mitral valve endocarditis.   MEDICATIONS:  To be determined by physician at the time of discharge.   CURRENT MEDICATIONS:  1. Cardizem 30 mg p.o. q.6 h.  2. Lasix 20 mg IV q.12 h.  3. Protonix 40 mg IV daily.  4. MiraLax 17 g in 8 ounces of fluid p.o. b.i.d.  5. Rocephin 1 g IV q.24 h.   CODE STATUS:  Full code.   ALLERGIES:  No known drug allergies.   PRIMARY CARE PHYSICIAN:  None.   CARDIOTHORACIC SURGEON:  Salvatore Decent. Cornelius Moras, M.D.   NEPHROLOGIST:  Terrial Rhodes, M.D.   CARDIOLOGIST:  Bevelyn Buckles. Bensimhon, M.D.   CONSULTANTS CURRENTLY:  1. Jamison Neighbor, M.D., urology.  2. Bevelyn Buckles. Bensimhon, M.D., cardiology.  3. Salvatore Decent. Cornelius Moras, M.D., cardiothoracic surgery.  4. Anselm Pancoast. Zachery Dakins, M.D., general surgery.   PROCEDURES:  None.   DIAGNOSTIC STUDIES:  1. Chest x-ray, two view, done on February 10, 2008, which shows      cardiomegaly and right base atelectasis; tiny pleural effusion;      mitral annuloplasty.  2. Portable renal ultrasound, which shows no evidence of      hydronephrosis or other explanation for elevation creatinine;  gallstones and gallbladder sludge, without specific evidence of      acute cholecystitis; suboptimally evaluated perihepatic cystic      focus, measuring up to 7.6 mm.  Given the appearance of the liver      on January 13, 2008 CT, this likely relates to evolving subcapsular or      perihepatic hematoma.  Consider further evaluation with dedicated      cross-sectional image on CT or MRI.  3. Repeat portable chest x-ray on February 10, 2008, which shows PICC      line in place in the right atrium.  4. CT of the abdomen without contrast done on February 11, 2008, which      shows a low-density subcapsular collection along the lateral margin      of the liver, measuring 14 x 10 x 6 cm, consistent with an evolving      subcapsular hematoma.  No sign of acute bleeding.  No      retroperitoneal hematoma or free intraperitoneal fluid.  5. Repeat CT of the  abdomen done on February 13, 2008 shows a stable      subcapsular and perihepatic collection, likely evolving hematoma.      There is stable right middle lobe pleural thickening and linear      scarring versus atelectasis, as compared with the most recent prior      abdominal CT.  Gallstones.  6. A 2D echocardiogram, which shows abnormal septal motion.  Ventricle      mildly dilated.  Overall left ventricular systolic function lower      limits of normal, with an ejection fraction of 50%, and mildly      increased left ventricular wall thickness.  Aortic valve mildly      calcified, and trivial aortic valvular regurgitation.  Normal-      appearing prosthetic valve, with only mild periprosthetic leak.      Left atrium moderately dilated, right atrium moderately dilated.      No vegetations present.   PRESENTING COMPLAINT:  Low hemoglobin.   HISTORY OF PRESENT ILLNESS:  Please see the H&P dictated by Dr. Christella Noa  for details of the HPI.  However, in short, this is a 65 year old  gentleman who had a recent mitral valve replacement on January 26, 2008 and   was discharged from the hospital after having complications of bleeding  during the postoperative course.  The patient went for a routine visit  to the nephrologist and at that time was found to have a hemoglobin of  5.6 and was sent to the hospital for further evaluation and admission.  In light of the fact that the patient left the hospital with a  hemoglobin well above that, and given the fact that he had been getting  an emerging perihepatic collection, it was felt that it was prudent for  the patient to be hospitalized and evaluated, given the fact that he was  on Coumadin for anticoagulation.   HOSPITAL COURSE:  1. Anemia.  The patient was found to have a hemoglobin of 5.6 on      admission.  He was resuscitated with a blood transfusion and      investigated for the source of his blood loss.  Upon evaluation by      CT, the patient was found to have a perihepatic collection of      fluid, which was consistent with a hematoma, which was      significantly larger in size than that of the one  when he was      discharged in June 2009.  In discussion with the radiologist,      however, it was felt that this was not an active bleed but had been      an accumulation and was in a subacute stage.  The patient was also      evaluated for any retroperitoneal bleeding that may have occurred,      given the current findings.  The hematoma and serial CTs have      remained stable.  However, the patient is still in serious      condition and should still have his abdominal CTs followed      serially.  I have spoken with Dr. Zachery Dakins from general surgery      regarding the hematoma.  His opinion is that at this time there is      nothing that will be done actively by the surgical service.      However, he will follow the patient's CTs and follow the  patient to      see if there is any necessary intervention, and the timing of such.      In addition to the hematoma, the patient has also had  hematuria,      and Dr. Marcelyn Bruins from urology has been consulted.  2. Hematuria.  As stated above, Dr. Logan Bores from urology has been      consulted.  The patient has had his INR reversed with [facility].      Currently, his INR is 1.7 and still being reversed.  Dr. Logan Bores felt      that the hematuria could not be the cause for such a significant      drop in the hemoglobin, as the patient did not report hematuria      prior to hospitalization.  He felt that this was due to Foley      trauma.  However, the patient continues to have hematuria, and I      have spoken with Dr. Logan Bores about possibly performing a flexible      cystoscopy at the bedside to further evaluate.  CT of the abdomen      and pelvis did not show any gross findings associated with the      bladder, which is encouraging.  A renal ultrasound was also      performed and showed no hydronephrosis or no findings that would      explain the hematuria.  We are awaiting Dr. Logan Bores' evaluation of      this problem.  3. History of atrial flutter.  The patient has a history of atrial      flutter and is on digoxin.  His heart rate is controlled in the 70s      at this time.  The patient did have an arrhythmia which appeared to      be torsades de pointes last night at about 11:00.  I have spoken      with Dr. Gala Romney, who will see the patient today.  The patient's      electrolytes were evaluated last night and were all found to be      within normal limits.  We are reevaluating the electrolytes today      to see if there has been any derangement that needs to be corrected      in relationship to this problem.  4. Chronic kidney disease, stage III.  The patient's creatinine      remained stable.  This is probably contributed to by the acute      blood loss that the patient is experiencing.  It is unclear as to      what the patient's baseline of hemoglobin is.  However, I would      suspect that the patient's hemoglobin is approximately  around 9,      contributed to largely by his chronic kidney disease, stage III.  5. Constipation.  The patient has had constipation during this      hospitalization and has been treated with MiraLax.  The patient had      2 large bowel movements last night.  We will continue to treat the      patient with MiraLax and stool softeners to address this issue.  6. Hyperbilirubinemia.  The patient was found to have      hyperbilirubinemia, with a mostly indirect component.  I would      suspect that this is likely due to the patient's hematoma and  increased blood loss and may be associated with some hemolysis      possibly by the bowel.  Will check an LDH on this patient also, and      we will continue to monitor the liver function tests, including the      bilirubin.  If it continues to be elevated, we will ask GI to see      the patient.  In terms of anemia, the patient is also having stool      guaiacs to look for any other sources of blood losses in this      patient.   This brings this patient's hospital course up to date to February 14, 2008.      Altha Harm, MD  Electronically Signed     MAM/MEDQ  D:  02/14/2008  T:  02/14/2008  Job:  [id number]

## 2010-11-18 NOTE — Consult Note (Signed)
NAMECAIRO, [name] NO.:  [id number]   MEDICAL RECORD NO.:  [id number]          PATIENT TYPE:  INP   LOCATION:  2018                         FACILITY:  [facility]   PHYSICIAN:  Jamison Neighbor, M.D.  DATE OF BIRTH:  1946/04/08   DATE OF CONSULTATION:  02/11/2008  DATE OF DISCHARGE:                                 CONSULTATION   REASON FOR CONSULTATION:  Gross hematuria.   HISTORY:  This 65 year old black male was admitted to the hospital on  February 10, 2008, with anemia of uncertain origin.  The patient has been  on Coumadin long-term because of problems with mitral valve and has  required anticoagulation since mitral valve replacement surgery.  The  patient was noted to be markedly anemic when he came in with an  hematocrit of 16.6.  He had weakness for over 3 weeks.  He had increased  peripheral swelling, and it was been noted that he has had dark-colored  urine for quite some time.   The patient's INR came back to 2.7 and PT of 30.8.  His BUN of 43 and  creatinine 2.5 were elevated.  He did have a renal ultrasound which did  not show any significant hydronephrosis but was a very limited study and  was recommended that the patient be considered for a CT scan or other  imaging studies without contrast.  His urinalysis showed 11-20 rbc's and  just 3-6 white cells per high-power field.   PAST MEDICAL HISTORY:  Remarkable for:  1. Congestive heart failure.  2. Chronic back pain.  3. Chronic renal insufficiency.  4. He also has history of ethanol abuse but says he has never been a      user of tobacco.   MEDICATIONS:  At the time of admission were Coumadin, Vicodin, digoxin,  amiodarone, and aspirin.  It should be noted that Coumadin was placed on  hold because of the hematuria.   PHYSICAL EXAMINATION:  GENERAL:  The patient is an alert male with no  acute distress.  He had no flank masses or tenderness.  ABDOMEN:  Soft and nontender with no palpable mass,  rebound, or  guarding.  GENITOURINARY:  The urine drained from the bladder is dark in color and  is most consistent with old blood without any fresh blood.  This raised  the question whether the patient may have an acute bleed due to  excessive anticoagulation that he is now passing out some of the  breakdown products of old clot.  His GU exam is otherwise normal.  RECTAL:  A smooth, non-nodular prostate.   The patient cannot have a contrast study with a BUN of 43 and creatinine  of 2.5.  I would certainly like to see him get cystoscopy and retrograde  to evaluate his lower urinary tract and upper tract.  I would be happy  to do this on February 12, 2008, if the patient is stable.      Jamison Neighbor, M.D.  Electronically Signed     RJE/MEDQ  D:  02/11/2008  T:  02/11/2008  Job:  [id number] 

## 2010-11-18 NOTE — Consult Note (Signed)
[name], [name]   MEDICAL RECORD NO.:  [id number]          PATIENT TYPE:  INP   LOCATION:  2920                         FACILITY:  [facility]   PHYSICIAN:  Salvatore Decent. Cornelius Moras, M.D. DATE OF BIRTH:  [birth year]   DATE OF CONSULTATION:  DATE OF DISCHARGE:                                 CONSULTATION   REQUESTING PHYSICIAN:  Lonia Blood, MD   REASON FOR CONSULTATION:  Severe mitral regurgitation.   HISTORY OF PRESENT ILLNESS:  [name] is a 65 year old African  American male from [place] with long-standing history of heart murmur  and severe mitral regurgitation.  The patient was hospitalized at [facility] in 2003 with atypical chest pain and shortness of  breath.  An echocardiogram at that time revealed bileaflet prolapse with  moderate-to-severe mitral regurgitation.  He apparently was not seen by  a cardiologist at that time.  The patient previously worked doing  Theatre stage manager care.  He had developed chronic back pain and  ultimately he gave up his business.  He states that problems with back  pain became much more severe beginning in November 2008.  He has been  seen in the emergency room on numerous occasions for back pain since  then.  He has had intermittent sweats.  He has lost his appetite.  He  states that he had lost 40 pounds in weight.  He has had some episodes  of atypical chest pain.  He was hospitalized in March for a congestive  heart failure.  An echocardiogram at that time revealed severe mitral  regurgitation.  The patient was not seen by a cardiologist during that  admission.  Ultimately, the patient was referred to the [facility]  Cardiology Team.  He was evaluated a few weeks ago by Dr. Diona Browner in  the office.  Plans were made for a repeat echocardiogram, and the  patient was counseled regarding the likely need for surgical  intervention.  The patient was admitted to [facility]  2 days ago with a several-day history of chest pain and  shortness of breath.  The patient states that he had significant  exertional shortness of breath.  He actually states that for a month he  had been sleeping propped up at night due to orthopnea, and he has had  frequent episodes of paroxysmal nocturnal dyspnea.  He has had problems  with severe bilateral lower extremity edema.  He was noted to be in  congestive heart failure at the time of admission 2 days ago.  He was  noted to have severe microcytic anemia with Hemoccult positive stool.  He has had fevers and has been noted to have an elevated white blood  count.  An echocardiogram was performed today.  This demonstrates severe  (4+) mitral regurgitation with possible vegetations on the mitral valve.  Cardiothoracic surgical consultation was requested.   REVIEW OF SYSTEMS:  Noted as previously.  The patient has lost 40 pounds  in weight and has decreased appetite.  He has had atypical chest pain  that is not  associated with activity.  He has had chronic back pain that  comes and goes in severity and is not positional.  The patient reports  feeling hot and cold intermittently, but is not aware of having any  fevers per se.  The patient has severe exertional shortness of breath  with episodes of PND, orthopnea, and lower extremity edema.  The patient  has not had any dizzy spells or syncope.  The patient reports no  difficulty swallowing.  He does have symptoms of postprandial epigastric  discomfort suggestive of GE reflux.  The patient reports normal bowel  function with perhaps mild constipation since he has been in the  hospital.  The patient is not aware of any gross hematochezia,  hematemesis, or melena.  The patient denies any transient monocular  blindness or transient numbness involving either upper or lower  extremity edema.  He reports no urinary difficulties.   PAST MEDICAL HISTORY:  1. Long-standing mitral  regurgitation.  2. Congestive heart failure.  3. Microcytic anemia.  4. Chronic back pain.  5. GE reflux disease.   PAST SURGICAL HISTORY:  None.   SOCIAL HISTORY:  The patient has remote history of heavy alcohol abuse,  but he quit drinking 2 years ago.  He does not smoke.  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  He is currently disabled.  He has a history of some noncompliance with  medical care.   Medications prior to admission are supposed to include Aldactone, Coreg,  Altace, iron, vitamin B complex, potassium, omeprazole, and Vicodin.  The patient ran out of his prescriptions and has not had them refilled  recently.   DRUG ALLERGIES:  None known.   PHYSICAL EXAM:  The patient is a thin [place] American male who is in no  acute distress.  Oxygen saturation is 100% on 2 L nasal cannula.  He has  sinus tachycardia with frequent PACs and PVCs.  He had low-grade fever  101.8 degrees within the last 24 hours.  Blood pressure is running  between 90 and 100 mmHg systolic.  HEENT exam is notable for poor  dentition.  The patient has only 4 teeth left that will need to be  extracted.  There is no jugular venous distention.  Auscultation of the  chest reveals a few inspiratory crackles.  No wheezes or rhonchi noted.  Cardiovascular exam notable for somewhat elevated heart rate.  There is  a prominent grade 4/6 holosystolic murmur heard all across the  precordium.  The abdomen is soft, nondistended, and nontender.  There is  some mild tenderness on palpation of the thoracic and lumbar spine.  Pulses are palpable in both groins.  There is mild bilateral lower  extremity edema.  Distal pulses are diminished, but palpable in the  posterior tibial position.  Rectal and GU exams are both deferred.  There may be somewhat small spots on the palmar aspect of the patient's  fingers in the plantar surface of his feet.   DIAGNOSTIC TESTS:  A 2-D  echocardiogram performed today is reviewed and  also compared with echocardiogram performed in March of this year and  another echocardiogram from 2003.  The patient now has wide-open severe  mitral regurgitation.  The patient has prolapse of multiple segments of  the mitral valve with thickened leaflet tissue and shaggy appearance  that could be consistent with vegetations from bacterial endocarditis.  There is mild-to-moderate aortic insufficiency.  There is  no sign of any  abscess within the aortic root or around the mitral annulus.  Left  ventricle is somewhat dilated.  There is mild left ventricular  dysfunction.  There are signs of pulmonary hypertension.  There is mild  tricuspid regurgitation.   IMPRESSION:  I suspect that Mr. Lastinger has endocarditis.  He has  severe, wide-open mitral regurgitation complicated by congestive heart  failure.  He has had low-grade fevers and elevated white blood count  during this hospitalization, and echocardiogram is suspicious for  bacterial endocarditis.  At present, he remains clinically stable.  He  will need dental service consult for dental extraction.  He will need  consult from the Infectious Disease Team, and I suspect that he should  be treated for presumed bacterial endocarditis.  He needs MRI of the  back to rule out  infectious diskitis or possible paraspinous abscess.  He will need  transesophageal echocardiogram and left and right heart  catheterizations.  We will continue to follow, and I feel that Mr.  Mcnerney should have his mitral valve surgery during this  hospitalization.      Salvatore Decent. Cornelius Moras, M.D.  Electronically Signed     CHO/MEDQ  D:  12/16/2007  T:  12/17/2007  Job:  [id number]   cc:   Lonia Blood, M.D.  Madolyn Frieze Jens Som, MD, [facility]

## 2010-11-21 NOTE — Discharge Summary (Signed)
NAMEIVERSON, SEES                         ACCOUNT NO.:  [id number]   MEDICAL RECORD NO.:  [id number]                   PATIENT TYPE:  INP   LOCATION:  3708                                 FACILITY:  [facility]   PHYSICIAN:  Talmage Coin, M.D.                  DATE OF BIRTH:  Nov 24, 1945   DATE OF ADMISSION:  DATE OF DISCHARGE:  03/29/2002                                 DISCHARGE SUMMARY   DISCHARGE DIAGNOSES:  1. Atypical chest pain origin shoulder, likely arthritis.  2. History of alcohol abuse.   DISCHARGE MEDICATION:  Vicodin 5/500 1-2 tablets p.o. q.6h p.r.n. pain  dispense #28.   HISTORY OF PRESENT ILLNESS:  Mr. Likins is a 65 year old gentleman with no  significant medical history who presented to the ED about 6 hours after  experiencing some chest pain.  The pain started around 1 p.m. at work.  The  patient works on cars.  The chest pain was sharp and on the left side of his  chest without radiation.  He denied any nausea, vomiting, diaphoresis,  shortness of breath, palpitations, or dizziness.  The pain lasted about an  hour.  The patient noted that he had an ache in the left shoulder and was  made worse with movement of the left arm.  The pain would wax and wane.  He  tried taking medication for gas without no relief of the pain.  He denies  any recent trauma.  Denies increased lifting at work.   Atypical chest pain.  The patient's known risk factors are age.  He denies  tobacco and has a negative family history, negative hypertension, negative  diabetes mellitus.  He was admitted to the hospital to rule out MI.  His  enzymes remained negative.  CK, PK at 242 and the troponin at 0.04.  EKG  showed normal sinus rhythm with a questionable first degree block.  No ST  elevation changes.  Chest x-ray showed mild vascular congestion.  CT of  chest was negative for PE and DVT.   PHYSICAL EXAMINATION:  HEART:  Significant for a holosystolic murmur.  The  patient received a  2D echo, but he said that he has had this murmur for many  years.   EXTREMITIES:  It is felt that the physical exam was also significant for  shoulder crepitus on the left.   It was felt that this was not cardiac in origin.  The patient was discharged  home in stable condition with Vicodin for pain relief.  In the outpatient  setting he might need an exercise Treadmill for risk stratification.   History of alcohol abuse.  The patient has been sober for over a year now.  We encouraged to encourage him in his sobriety.   FOLLOW UP:  The patient will follow up with Dr. Oretha Ellis in [facility]  Outpatient clinic 10/23 at 2 p.m.  At that point we will see if his pain has  resolved.    DISCHARGE LABORATORY:  Sodium 140, potassium 3.4, chloride 105, bicarb 30,  glucose 88, BUN 10, creatinine 1.0, calcium 9.2, hemoglobin 14.5, hematocrit  43.2, white blood cells 4.5, platelets 278,000.     Oretha Ellis, M.D.                      Talmage Coin, M.D.    BT/MEDQ  D:  03/29/2002  T:  03/30/2002  Job:  [id number]   cc:   Oretha Ellis, M.D.  [address] [place], [place] [address]  Fax: [phone]

## 2010-12-05 ENCOUNTER — Encounter: Payer: Self-pay | Admitting: Internal Medicine

## 2010-12-08 ENCOUNTER — Other Ambulatory Visit: Payer: Self-pay | Admitting: Nephrology

## 2010-12-08 ENCOUNTER — Encounter (HOSPITAL_COMMUNITY): Payer: Medicare Other | Attending: Nephrology

## 2010-12-08 DIAGNOSIS — D638 Anemia in other chronic diseases classified elsewhere: Secondary | ICD-10-CM | POA: Insufficient documentation

## 2010-12-08 DIAGNOSIS — N183 Chronic kidney disease, stage 3 unspecified: Secondary | ICD-10-CM | POA: Insufficient documentation

## 2010-12-09 LAB — POCT HEMOGLOBIN-HEMACUE: Hemoglobin: 11.5 g/dL — ABNORMAL LOW (ref 13.0–17.0)

## 2010-12-22 ENCOUNTER — Ambulatory Visit (INDEPENDENT_AMBULATORY_CARE_PROVIDER_SITE_OTHER): Payer: Medicare Other | Admitting: Internal Medicine

## 2010-12-22 ENCOUNTER — Encounter: Payer: Self-pay | Admitting: Internal Medicine

## 2010-12-22 VITALS — BP 118/62 | HR 95 | Ht 68.0 in | Wt 176.0 lb

## 2010-12-22 DIAGNOSIS — I5022 Chronic systolic (congestive) heart failure: Secondary | ICD-10-CM

## 2010-12-22 DIAGNOSIS — R5381 Other malaise: Secondary | ICD-10-CM

## 2010-12-22 DIAGNOSIS — I428 Other cardiomyopathies: Secondary | ICD-10-CM

## 2010-12-22 DIAGNOSIS — R5383 Other fatigue: Secondary | ICD-10-CM

## 2010-12-22 DIAGNOSIS — I059 Rheumatic mitral valve disease, unspecified: Secondary | ICD-10-CM

## 2010-12-22 MED ORDER — LISINOPRIL 20 MG PO TABS: 20.0000 mg | ORAL_TABLET | Freq: Every day | ORAL | Status: DC

## 2010-12-22 NOTE — Progress Notes (Signed)
HPI:  [name] is a 65 year old male with  complicated past medical history as listed below. He returns today for routine followup.  TTE in 2010 showed normal EF with elevated gradient across MV (mean 14) with perivalvular leak. Follow-up TEE in June 2010 with EF 40-45% mild stenosis mean gradient = 8. No vegetation or abscess. Most recent echo 7/11 EF 35-40% mild perivalvular MR.   Has not been on b-blocker due to very long 1st AVB.   Returns for routine f/u. Main complaint is that he is fatigued. Says it has been worse over the past month. He is retired but continues to do some work on cars at home. Denies dyspnea. He has not had any lower extremity edema. He denies any palpitations. No syncope/presyncope.  He is been compliant with all his medications. He denies any smoking or drinking. His INR has been followed closely by the family medicine clinic. Most recent INR 2.6.  He has not had any bleeding.   ROS: All systems negative except as listed in HPI, PMH and Problem List.  Past Medical History  Diagnosis Date  . 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  a- Echo 07/2008 Ef 55% mild peroprosthetic MVR with High transmitral gradient (mean 14).  b- normal coronaries by cath 12/2007  . Atrial fibrillation     post op.  s/p dc-cv 04/2008. on amiodarone  . Atrial flutter     atypical  . Heavy alcohol use     history  . IV drug abuse     history of  . CHF (congestive heart failure)     due to valvular disease and diastolic dysfunction  . History of GI bleed   . Hypertension   . GERD (gastroesophageal reflux disease)   . Chronic back pain   . AV block, 1st degree     >331ms  . Dysphagia     with normal barium swallow 09/2008    Current Outpatient Prescriptions  Medication Sig Dispense Refill  . KLOR-CON M20 20 MEQ tablet Take 1 tablet by mouth Daily.      Marland Kitchen lisinopril (PRINIVIL,ZESTRIL) 10 MG tablet Take 10 mg by mouth daily.        Marland Kitchen  omeprazole (PRILOSEC) 20 MG capsule Take 20 mg by mouth daily.        . traMADol (ULTRAM) 50 MG tablet Take 50 mg by mouth every 8 (eight) hours as needed.        . warfarin (COUMADIN) 5 MG tablet Take 5 mg by mouth as directed. Take as directed by anticoagulation clinic       . DISCONTD: potassium chloride (MICRO-K) 10 MEQ CR capsule Take 10 mEq by mouth daily.           PHYSICAL EXAM: Filed Vitals:   12/22/10 1535  BP: 118/62  Pulse: 95   Gen: well appearing. no resp difficulty HEENT: normal Neck: supple. no JVD. Carotids 2+ bilat; no bruits. No  Cor: PMI nondisplaced. Mildly irregular. No rubs, gallops. mechanical s1. soft diastolic mitral murmur.  Lungs: clear Abdomen: soft, nontender, nondistended. No hepatosplenomegaly. No bruits or masses. Good bowel sounds. Extremities: no cyanosis, clubbing, rash, edema Neuro: alert & orientedx3, cranial nerves grossly intact. moves all 4 extremities w/o difficulty. affect pleasant     ECG: Probable NSR with very prolonged 1AVB vs junctional rhythm with slower sinus rate. No ST-T wave abnormalities.  '   ASSESSMENT & PLAN:

## 2010-12-22 NOTE — Assessment & Plan Note (Signed)
S/p MVR. Stable. Continue coumadin.

## 2010-12-22 NOTE — Assessment & Plan Note (Addendum)
Has mild fatigue. Not sure if this is due to LV dysfunction, marked 1AVB or another cause. Will check CPX to assess functional capacity, if he is chronotropically competent and if 1AVB improves with exercise. Will ask Dr. Graciela Husbands to see for his thoughts on if he would benefit from BiV pacer. Check labs. Increase lisinopril to 20 qd.

## 2010-12-22 NOTE — Patient Instructions (Signed)
Increase Lisinopril to 20 mg daily  Labs today (bmet, liver, cbc, tsh)  Your physician recommends that you return for lab work in: 2 weeks (bmet [phone])  Your physician has recommended that you have a cardiopulmonary stress test (CPX). CPX testing is a non-invasive measurement of heart and lung function. It replaces a traditional treadmill stress test. This type of test provides a tremendous amount of information that relates not only to your present condition but also for future outcomes. This test combines measurements of you ventilation, respiratory gas exchange in the lungs, electrocardiogram (EKG), blood pressure and physical response before, during, and following an exercise protocol.  You have been referred to Dr Graciela Husbands in 1-2 months.  Your physician recommends that you schedule a follow-up appointment in: 3 months.

## 2010-12-30 ENCOUNTER — Telehealth: Payer: Self-pay | Admitting: Internal Medicine

## 2010-12-30 ENCOUNTER — Ambulatory Visit (HOSPITAL_COMMUNITY): Payer: Medicare Other | Attending: Internal Medicine

## 2010-12-30 ENCOUNTER — Encounter: Payer: Self-pay | Admitting: Internal Medicine

## 2010-12-30 DIAGNOSIS — I5022 Chronic systolic (congestive) heart failure: Secondary | ICD-10-CM | POA: Insufficient documentation

## 2010-12-30 DIAGNOSIS — I509 Heart failure, unspecified: Secondary | ICD-10-CM

## 2010-12-30 NOTE — Telephone Encounter (Signed)
Pt calling for letter stating his daughter, [name], has been taking care of her father and now she is ready to go back to school???--don't quite know why letter is needed but advised i would pass message along to dr bensimhon and his nurse--pt agrees--nt

## 2010-12-30 NOTE — Telephone Encounter (Signed)
LEE BRYANT STATES PT DAUGHTER HAS BEEN TAKING CARE OF HIM AND NOW SHE NEEDS A LETTER STATING HER FATHER CONDITION.

## 2010-12-31 NOTE — Telephone Encounter (Signed)
Spoke w/[name] she needs a letter stating pt's medical condition and that she has been caring for him

## 2011-01-05 ENCOUNTER — Other Ambulatory Visit: Payer: Self-pay | Admitting: Nephrology

## 2011-01-05 ENCOUNTER — Encounter (HOSPITAL_COMMUNITY): Payer: Medicare Other | Attending: Nephrology

## 2011-01-05 ENCOUNTER — Other Ambulatory Visit (INDEPENDENT_AMBULATORY_CARE_PROVIDER_SITE_OTHER): Payer: Medicare Other | Admitting: *Deleted

## 2011-01-05 DIAGNOSIS — N183 Chronic kidney disease, stage 3 unspecified: Secondary | ICD-10-CM | POA: Insufficient documentation

## 2011-01-05 DIAGNOSIS — D638 Anemia in other chronic diseases classified elsewhere: Secondary | ICD-10-CM | POA: Insufficient documentation

## 2011-01-05 DIAGNOSIS — R5383 Other fatigue: Secondary | ICD-10-CM

## 2011-01-05 DIAGNOSIS — I5022 Chronic systolic (congestive) heart failure: Secondary | ICD-10-CM

## 2011-01-05 LAB — IRON AND TIBC
Saturation Ratios: 20 % (ref 20–55)
UIBC: 208 ug/dL

## 2011-01-05 LAB — RENAL FUNCTION PANEL
Albumin: 4.3 g/dL (ref 3.5–5.2)
GFR calc Af Amer: 59 mL/min — ABNORMAL LOW (ref >60)
Glucose, Bld: 100 mg/dL — ABNORMAL HIGH (ref 70–99)
Phosphorus: 2.6 mg/dL (ref 2.3–4.6)
Potassium: 3.9 mEq/L (ref 3.5–5.1)
Sodium: 139 mEq/L (ref 135–145)

## 2011-01-06 LAB — BASIC METABOLIC PANEL
CO2: 24 mEq/L (ref 19–32)
Calcium: 8.7 mg/dL (ref 8.4–10.5)
Chloride: 102 mEq/L (ref 96–112)
Potassium: 4.6 mEq/L (ref 3.5–5.3)
Sodium: 139 mEq/L (ref 135–145)

## 2011-01-08 NOTE — Telephone Encounter (Signed)
Pt daughter needs a letter stating her father condition.

## 2011-01-09 ENCOUNTER — Encounter: Payer: Self-pay | Admitting: Internal Medicine

## 2011-01-09 NOTE — Telephone Encounter (Signed)
Pt aware letter is completed and at front desk he or his daughter will p/u

## 2011-02-02 ENCOUNTER — Encounter (HOSPITAL_COMMUNITY): Payer: Medicare Other

## 2011-02-02 ENCOUNTER — Other Ambulatory Visit: Payer: Self-pay | Admitting: Nephrology

## 2011-02-02 LAB — POCT HEMOGLOBIN-HEMACUE: Hemoglobin: 11.4 g/dL — ABNORMAL LOW (ref 13.0–17.0)

## 2011-02-18 ENCOUNTER — Ambulatory Visit (INDEPENDENT_AMBULATORY_CARE_PROVIDER_SITE_OTHER): Payer: Medicare Other | Admitting: Internal Medicine

## 2011-02-18 ENCOUNTER — Encounter: Payer: Self-pay | Admitting: *Deleted

## 2011-02-18 ENCOUNTER — Encounter: Payer: Self-pay | Admitting: Internal Medicine

## 2011-02-18 DIAGNOSIS — I440 Atrioventricular block, first degree: Secondary | ICD-10-CM

## 2011-02-18 DIAGNOSIS — I428 Other cardiomyopathies: Secondary | ICD-10-CM

## 2011-02-18 NOTE — Patient Instructions (Signed)
Your physician has recommended that you have a pacemaker inserted. A pacemaker is a small device that is placed under the skin of your chest or abdomen to help control abnormal heart rhythms. This device uses electrical pulses to prompt the heart to beat at a normal rate. Pacemakers are used to treat heart rhythms that are too slow. Wire (leads) are attached to the pacemaker that goes into the chambers of you heart. This is done in the hospital and usually requires and overnight stay. Please see the instruction sheet given to you today for more information.  Your physician has requested that you have an echocardiogram prior to your pacemaker implant. Echocardiography is a painless test that uses sound waves to create images of your heart. It provides your doctor with information about the size and shape of your heart and how well your heart's chambers and valves are working. This procedure takes approximately one hour. There are no restrictions for this procedure.

## 2011-02-18 NOTE — Assessment & Plan Note (Signed)
Pt has progressive 1 AVB with associeated worsening fatigue  He now has almost simultaneous AV contraction which gives rise to pacemaker syndrome like symptoms.  With modest depression of LV function the corrrection would require CRT at the time We have discussed extensively the physiology adn potential improveemnts related to restoring AV synchrony.  We also addressed risks of the procedure andhe is agreeable with proceeding

## 2011-02-18 NOTE — Assessment & Plan Note (Signed)
EF wwas 35-40 a year ago, will recheck LV dfunction to determine role of ICD

## 2011-02-18 NOTE — Progress Notes (Signed)
HPI:  [name] is a 65 year old male seen for consideration of pacer implantation.   He is s/p MVR for endocarditis associated with IVDA.  TTE in 2010 showed normal EF with elevated gradient across MV (mean 14) with perivalvular leak. Follow-up TEE in June 2010 with EF 40-45% mild stenosis mean gradient = 8. No vegetation or abscess. Most recent echo 7/11 EF 35-40% mild perivalvular MR.   He has had progressive sob and fatigue  particularly over the last year.  He has been noted to have progressive 1AVB with PR 2010 280, 2011 320 and 2012 450-500 msec He denies edema, PND or  Othopnea.  No syncope  Has not been on b-blocker due to very long 1st AVB.   He underwent CPX demonstrating normal chronotropic competence, PR interval data are lacking   ROS: All systems negative except as listed in HPI, PMH and Problem List.  Past Medical History  Diagnosis Date  . 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  a- Echo 07/2008 Ef 55% mild peroprosthetic MVR with High transmitral gradient (mean 14).  b- normal coronaries by cath 12/2007  . Atrial fibrillation     post op.  s/p dc-cv 04/2008. on amiodarone  . Atrial flutter     atypical  . Heavy alcohol use     history  . IV drug abuse     history of  . CHF (congestive heart failure)     due to valvular disease and diastolic dysfunction  . History of GI bleed   . Hypertension   . GERD (gastroesophageal reflux disease)   . Chronic back pain   . AV block, 1st degree     >384ms  . Dysphagia     with normal barium swallow 09/2008    Current Outpatient Prescriptions  Medication Sig Dispense Refill  . KLOR-CON M20 20 MEQ tablet Take 1 tablet by mouth Daily.      Marland Kitchen lisinopril (PRINIVIL,ZESTRIL) 20 MG tablet Take 1 tablet (20 mg total) by mouth daily.  30 tablet  3  . omeprazole (PRILOSEC) 20 MG capsule Take 20 mg by mouth daily.        . traMADol (ULTRAM) 50 MG tablet Take 50 mg by mouth every 8 (eight) hours  as needed.        . warfarin (COUMADIN) 5 MG tablet Take 5 mg by mouth as directed. Take as directed by anticoagulation clinic          PHYSICAL EXAM: Filed Vitals:   02/18/11 1437  BP: 111/74  Pulse: 85  Resp: 14   Gen: well appearing. no resp difficulty HEENT: normal but edentulous Neck: supple. no JVD. Carotids 2+ bilat; no bruits. No  Cor: PMI nondisplaced. Mildly irregular. No rubs, gallops. mechanical s1. soft systolic  murmur.  Lungs: clear Abdomen: soft, nontender, nondistended. No hepatosplenomegaly. No bruits or masses. Good bowel sounds. Extremities: no cyanosis, clubbing, rash, edema Neuro: alert & orientedx3, cranial nerves grossly intact. moves all 4 extremities w/o difficulty. affect pleasant Skin Wamr and dry Affect engaging LN -negative    ECG: NSR with PR 450-500 msec'   ASSESSMENT & PLAN:

## 2011-02-18 NOTE — Assessment & Plan Note (Signed)
As above.

## 2011-02-24 ENCOUNTER — Ambulatory Visit (HOSPITAL_COMMUNITY): Payer: Medicare Other | Attending: Family Medicine

## 2011-02-24 ENCOUNTER — Other Ambulatory Visit (INDEPENDENT_AMBULATORY_CARE_PROVIDER_SITE_OTHER): Payer: Medicare Other | Admitting: *Deleted

## 2011-02-24 DIAGNOSIS — Z954 Presence of other heart-valve replacement: Secondary | ICD-10-CM | POA: Insufficient documentation

## 2011-02-24 DIAGNOSIS — I440 Atrioventricular block, first degree: Secondary | ICD-10-CM

## 2011-02-24 DIAGNOSIS — D649 Anemia, unspecified: Secondary | ICD-10-CM | POA: Insufficient documentation

## 2011-02-24 DIAGNOSIS — I428 Other cardiomyopathies: Secondary | ICD-10-CM

## 2011-02-24 DIAGNOSIS — R42 Dizziness and giddiness: Secondary | ICD-10-CM | POA: Insufficient documentation

## 2011-02-24 DIAGNOSIS — R0609 Other forms of dyspnea: Secondary | ICD-10-CM | POA: Insufficient documentation

## 2011-02-24 DIAGNOSIS — R0989 Other specified symptoms and signs involving the circulatory and respiratory systems: Secondary | ICD-10-CM | POA: Insufficient documentation

## 2011-02-24 DIAGNOSIS — I079 Rheumatic tricuspid valve disease, unspecified: Secondary | ICD-10-CM | POA: Insufficient documentation

## 2011-02-24 LAB — CBC WITH DIFFERENTIAL/PLATELET
Basophils Absolute: 0 10*3/uL (ref 0.0–0.1)
Basophils Relative: 0.7 % (ref 0.0–3.0)
Eosinophils Relative: 1.7 % (ref 0.0–5.0)
HCT: 33.8 % — ABNORMAL LOW (ref 39.0–52.0)
Hemoglobin: 11.3 g/dL — ABNORMAL LOW (ref 13.0–17.0)
Lymphocytes Relative: 39.5 % (ref 12.0–46.0)
Lymphs Abs: 1.6 10*3/uL (ref 0.7–4.0)
Monocytes Relative: 10.3 % (ref 3.0–12.0)
Neutro Abs: 2 10*3/uL (ref 1.4–7.7)
RBC: 3.92 Mil/uL — ABNORMAL LOW (ref 4.22–5.81)
RDW: 16.5 % — ABNORMAL HIGH (ref 11.5–14.6)

## 2011-02-24 LAB — BASIC METABOLIC PANEL
Calcium: 9.2 mg/dL (ref 8.4–10.5)
GFR: 62.89 mL/min (ref >60.00)
Glucose, Bld: 86 mg/dL (ref 70–99)
Potassium: 4.7 mEq/L (ref 3.5–5.1)
Sodium: 139 mEq/L (ref 135–145)

## 2011-03-02 ENCOUNTER — Ambulatory Visit (HOSPITAL_COMMUNITY): Payer: Medicare Other

## 2011-03-02 ENCOUNTER — Telehealth: Payer: Self-pay | Admitting: *Deleted

## 2011-03-02 ENCOUNTER — Ambulatory Visit (HOSPITAL_COMMUNITY)
Admission: RE | Admit: 2011-03-02 | Discharge: 2011-03-02 | Disposition: A | Discharge status: Home / Self Care | Payer: Medicare Other | Source: Ambulatory Visit | Attending: Internal Medicine | Admitting: Internal Medicine

## 2011-03-02 DIAGNOSIS — I440 Atrioventricular block, first degree: Secondary | ICD-10-CM | POA: Insufficient documentation

## 2011-03-02 DIAGNOSIS — Z538 Procedure and treatment not carried out for other reasons: Secondary | ICD-10-CM | POA: Insufficient documentation

## 2011-03-02 LAB — PROTIME-INR
INR: 3.21 — ABNORMAL HIGH (ref 0.00–1.49)
Prothrombin Time: 33.3 seconds — ABNORMAL HIGH (ref 11.6–15.2)

## 2011-03-02 LAB — SURGICAL PCR SCREEN
MRSA, PCR: NEGATIVE
Staphylococcus aureus: NEGATIVE

## 2011-03-02 NOTE — Telephone Encounter (Signed)
Mr. Schoeppner presented today for CRTD implant.  INR was 3.2.  Procedure cancelled per Dr Graciela Husbands.  Instructions per Dr Graciela Husbands- pt to take regular dose of Coumadin (7.5mg ) through Friday.  Saturday, Sunday, Monday, and Tuesday, pt should take 5mg  of Coumadin daily.  Procedure rescheduled for Wednesday, September 5th at10:30AM.  Pt instructed to arrive at short stay at 8:30AM.  Pt to have INR check at [facility] on Tuesday, September 4th at 9:30AM.  CVRR will call Dr Graciela Husbands if INR >2.8 at that visit.

## 2011-03-06 ENCOUNTER — Encounter: Payer: Self-pay | Admitting: Internal Medicine

## 2011-03-06 ENCOUNTER — Ambulatory Visit: Payer: Medicare Other | Admitting: Internal Medicine

## 2011-03-06 ENCOUNTER — Ambulatory Visit (INDEPENDENT_AMBULATORY_CARE_PROVIDER_SITE_OTHER): Payer: Medicare Other | Admitting: Internal Medicine

## 2011-03-06 VITALS — BP 104/64 | HR 83 | Ht 68.0 in | Wt 172.0 lb

## 2011-03-06 DIAGNOSIS — R5381 Other malaise: Secondary | ICD-10-CM

## 2011-03-06 DIAGNOSIS — I059 Rheumatic mitral valve disease, unspecified: Secondary | ICD-10-CM

## 2011-03-06 DIAGNOSIS — I4892 Unspecified atrial flutter: Secondary | ICD-10-CM

## 2011-03-06 DIAGNOSIS — R5383 Other fatigue: Secondary | ICD-10-CM | POA: Insufficient documentation

## 2011-03-06 DIAGNOSIS — I429 Cardiomyopathy, unspecified: Secondary | ICD-10-CM

## 2011-03-06 DIAGNOSIS — I499 Cardiac arrhythmia, unspecified: Secondary | ICD-10-CM

## 2011-03-06 NOTE — Progress Notes (Signed)
HPI:  [name] is a 65 year old male with h/o polysubstance abuse c/b MV endocarditis s/p St. Jude MVR, CHF due to NICM 35-40% and PAF. He returns today for routine followup.  TTE in 2010 showed normal EF with elevated gradient across MV (mean 14) with perivalvular leak. Follow-up TEE in June 2010 with EF 40-45% mild stenosis mean gradient = 8. No vegetation or abscess. Most recent echo 7/12 EF 35% mild perivalvular MR.   Has not been on b-blocker due to very long 1st AVB. Recently having severe fatigue so I referred him to Dr. Graciela Hubbard for consideration of a PPM.  They are planning CRT to be placed on Sept 5.   Returns for routine f/u. Says he feels OK except for fatigue. Denies dyspnea. He has not had any lower extremity edema. He denies any palpitations. No syncope/presyncope.  He is been compliant with all his medications. He denies any smoking or drinking. His INR has been followed closely by the family medicine clinic.  He has not had any bleeding.   ROS: All systems negative except as listed in HPI, PMH and Problem List.  Past Medical History  Diagnosis Date  . 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  a- Echo 07/2008 Ef 55% mild peroprosthetic MVR with High transmitral gradient (mean 14).  b- normal coronaries by cath 12/2007  . Atrial fibrillation     post op.  s/p dc-cv 04/2008. previously on amiodarone  . Atrial flutter     atypical  . Heavy alcohol use     history  . IV drug abuse     history of  . CHF (congestive heart failure)     EF 35-40 % 2011 due to valvular disease and diastolic dysfunction  . History of GI bleed   . Hypertension   . GERD (gastroesophageal reflux disease)   . Chronic back pain   . AV block, 1st degree     .450 msec--progressive  . Dysphagia     with normal barium swallow 09/2008    Current Outpatient Prescriptions  Medication Sig Dispense Refill  . KLOR-CON M20 20 MEQ tablet Take 1 tablet by mouth Daily.        Marland Kitchen lisinopril (PRINIVIL,ZESTRIL) 20 MG tablet Take 1 tablet (20 mg total) by mouth daily.  30 tablet  3  . omeprazole (PRILOSEC) 20 MG capsule Take 20 mg by mouth daily.        . traMADol (ULTRAM) 50 MG tablet Take 50 mg by mouth every 8 (eight) hours as needed.        . warfarin (COUMADIN) 5 MG tablet Take 5 mg by mouth as directed. Take as directed by anticoagulation clinic          PHYSICAL EXAM: Filed Vitals:   03/06/11 1114  BP: 104/64  Pulse: 83   Gen: well appearing. no resp difficulty HEENT: normal Neck: supple. no JVD. Carotids 2+ bilat; no bruits. No  Cor: PMI nondisplaced. Mildly irregular. No rubs, gallops. mechanical s1. soft diastolic mitral murmur.  Lungs: clear Abdomen: soft, nontender, nondistended. No hepatosplenomegaly. No bruits or masses. Good bowel sounds. Extremities: no cyanosis, clubbing, rash, edema Neuro: alert & orientedx3, cranial nerves grossly intact. moves all 4 extremities w/o difficulty. affect pleasant   ECG: NSR with very prolonged 1AVB  No ST-T wave abnormalities.  '   ASSESSMENT & PLAN:

## 2011-03-06 NOTE — Assessment & Plan Note (Signed)
EF 35% on recent echo. No volume overload. Continue ACE-I. Start b-blocker after pacer in place.

## 2011-03-06 NOTE — Assessment & Plan Note (Signed)
S/p MVR. Doing well. Continue coumadin.

## 2011-03-06 NOTE — Patient Instructions (Signed)
Your physician recommends that you schedule a follow-up appointment in: 3 months in the heart failure clinic

## 2011-03-06 NOTE — Assessment & Plan Note (Addendum)
I am hoping he will benefit from CRT. We will wait and see. We had a discussion about the role of pacing to re-coordinate his heart.

## 2011-03-10 ENCOUNTER — Other Ambulatory Visit: Payer: Self-pay | Admitting: Nephrology

## 2011-03-10 ENCOUNTER — Encounter (HOSPITAL_COMMUNITY): Payer: Medicare Other | Attending: Nephrology

## 2011-03-10 ENCOUNTER — Ambulatory Visit (INDEPENDENT_AMBULATORY_CARE_PROVIDER_SITE_OTHER): Payer: Medicare Other | Admitting: *Deleted

## 2011-03-10 DIAGNOSIS — D638 Anemia in other chronic diseases classified elsewhere: Secondary | ICD-10-CM | POA: Insufficient documentation

## 2011-03-10 DIAGNOSIS — Z7901 Long term (current) use of anticoagulants: Secondary | ICD-10-CM

## 2011-03-10 DIAGNOSIS — N183 Chronic kidney disease, stage 3 unspecified: Secondary | ICD-10-CM | POA: Insufficient documentation

## 2011-03-10 DIAGNOSIS — I4892 Unspecified atrial flutter: Secondary | ICD-10-CM

## 2011-03-10 LAB — IRON AND TIBC
Iron: 41 ug/dL — ABNORMAL LOW (ref 42–135)
Saturation Ratios: 17 % — ABNORMAL LOW (ref 20–55)
UIBC: 207 ug/dL (ref 125–400)

## 2011-03-10 LAB — RENAL FUNCTION PANEL
BUN: 18 mg/dL (ref 6–23)
CO2: 25 mEq/L (ref 19–32)
Chloride: 106 mEq/L (ref 96–112)
Creatinine, Ser: 1.19 mg/dL (ref 0.50–1.35)
Glucose, Bld: 114 mg/dL — ABNORMAL HIGH (ref 70–99)

## 2011-03-10 LAB — FERRITIN: Ferritin: 275 ng/mL (ref 22–322)

## 2011-03-10 NOTE — Patient Instructions (Signed)
Pt instructed to continue new dose of Coumadin and follow with [facility] next week.

## 2011-03-11 ENCOUNTER — Ambulatory Visit (HOSPITAL_COMMUNITY)
Admission: RE | Admit: 2011-03-11 | Discharge: 2011-03-12 | Disposition: A | Discharge status: Home / Self Care | Payer: Medicare Other | Source: Ambulatory Visit | Attending: Internal Medicine | Admitting: Internal Medicine

## 2011-03-11 DIAGNOSIS — Z7901 Long term (current) use of anticoagulants: Secondary | ICD-10-CM | POA: Insufficient documentation

## 2011-03-11 DIAGNOSIS — I5030 Unspecified diastolic (congestive) heart failure: Secondary | ICD-10-CM | POA: Insufficient documentation

## 2011-03-11 DIAGNOSIS — I509 Heart failure, unspecified: Secondary | ICD-10-CM | POA: Insufficient documentation

## 2011-03-11 DIAGNOSIS — Z01818 Encounter for other preprocedural examination: Secondary | ICD-10-CM | POA: Insufficient documentation

## 2011-03-11 DIAGNOSIS — I5022 Chronic systolic (congestive) heart failure: Secondary | ICD-10-CM

## 2011-03-11 DIAGNOSIS — Z954 Presence of other heart-valve replacement: Secondary | ICD-10-CM | POA: Insufficient documentation

## 2011-03-11 DIAGNOSIS — I440 Atrioventricular block, first degree: Secondary | ICD-10-CM | POA: Insufficient documentation

## 2011-03-11 DIAGNOSIS — I10 Essential (primary) hypertension: Secondary | ICD-10-CM | POA: Insufficient documentation

## 2011-03-11 DIAGNOSIS — K219 Gastro-esophageal reflux disease without esophagitis: Secondary | ICD-10-CM | POA: Insufficient documentation

## 2011-03-11 DIAGNOSIS — I428 Other cardiomyopathies: Secondary | ICD-10-CM | POA: Insufficient documentation

## 2011-03-11 DIAGNOSIS — Z01812 Encounter for preprocedural laboratory examination: Secondary | ICD-10-CM | POA: Insufficient documentation

## 2011-03-11 DIAGNOSIS — I441 Atrioventricular block, second degree: Secondary | ICD-10-CM

## 2011-03-11 LAB — CBC
HCT: 32.2 % — ABNORMAL LOW (ref 39.0–52.0)
MCHC: 32.3 g/dL (ref 30.0–36.0)
RDW: 15.6 % — ABNORMAL HIGH (ref 11.5–15.5)

## 2011-03-11 LAB — PROTIME-INR
INR: 2.76 — ABNORMAL HIGH (ref 0.00–1.49)
Prothrombin Time: 29.6 seconds — ABNORMAL HIGH (ref 11.6–15.2)

## 2011-03-11 LAB — BASIC METABOLIC PANEL
BUN: 17 mg/dL (ref 6–23)
Chloride: 106 mEq/L (ref 96–112)
Creatinine, Ser: 1.06 mg/dL (ref 0.50–1.35)
GFR calc Af Amer: >60 mL/min (ref >60)
Glucose, Bld: 92 mg/dL (ref 70–99)

## 2011-03-12 ENCOUNTER — Ambulatory Visit: Payer: Medicare Other | Admitting: *Deleted

## 2011-03-12 ENCOUNTER — Ambulatory Visit (HOSPITAL_COMMUNITY): Payer: Medicare Other

## 2011-03-12 LAB — PROTIME-INR: INR: 2.83 — ABNORMAL HIGH (ref 0.00–1.49)

## 2011-03-13 ENCOUNTER — Ambulatory Visit (INDEPENDENT_AMBULATORY_CARE_PROVIDER_SITE_OTHER): Payer: Medicare Other | Admitting: *Deleted

## 2011-03-13 DIAGNOSIS — I495 Sick sinus syndrome: Secondary | ICD-10-CM

## 2011-03-13 DIAGNOSIS — R0989 Other specified symptoms and signs involving the circulatory and respiratory systems: Secondary | ICD-10-CM

## 2011-03-13 LAB — PACEMAKER DEVICE OBSERVATION: DEVICE MODEL PM: 2665541

## 2011-03-13 NOTE — Progress Notes (Signed)
Wound check

## 2011-03-23 ENCOUNTER — Ambulatory Visit: Payer: Medicare Other | Admitting: *Deleted

## 2011-03-25 ENCOUNTER — Encounter (HOSPITAL_COMMUNITY): Payer: Medicare Other

## 2011-03-25 ENCOUNTER — Other Ambulatory Visit: Payer: Self-pay | Admitting: Nephrology

## 2011-03-30 LAB — TYPE AND SCREEN
ABO/RH(D): O POS
Antibody Screen: NEGATIVE

## 2011-03-30 LAB — URINALYSIS, ROUTINE W REFLEX MICROSCOPIC
Bilirubin Urine: NEGATIVE
Glucose, UA: NEGATIVE
Hgb urine dipstick: NEGATIVE
Ketones, ur: 15 — AB
Nitrite: NEGATIVE
Protein, ur: NEGATIVE
Specific Gravity, Urine: 1.024
Urobilinogen, UA: 1
pH: 7.5

## 2011-03-30 LAB — COMPREHENSIVE METABOLIC PANEL
BUN: 10
CO2: 29
Calcium: 8.7
GFR calc non Af Amer: >60
Glucose, Bld: 111 — ABNORMAL HIGH
Total Protein: 6.2

## 2011-03-30 LAB — CK TOTAL AND CKMB (NOT AT ARMC): Total CK: 111

## 2011-03-30 LAB — HEPATIC FUNCTION PANEL
Indirect Bilirubin: 0.7
Total Protein: 6.6

## 2011-03-30 LAB — POCT CARDIAC MARKERS
CKMB, poc: 1.8
Myoglobin, poc: 82
Operator id: 5451

## 2011-03-30 LAB — BASIC METABOLIC PANEL
BUN: 10
BUN: 9
CO2: 28
Chloride: 101
Chloride: 98
Creatinine, Ser: 1.07
Glucose, Bld: 106 — ABNORMAL HIGH
Glucose, Bld: 95
Potassium: 3 — ABNORMAL LOW
Sodium: 140

## 2011-03-30 LAB — CBC
HCT: 29 — ABNORMAL LOW
Hemoglobin: 9.5 — ABNORMAL LOW
MCHC: 32.8
MCHC: 33.1
MCV: 76.9 — ABNORMAL LOW
MCV: 77.3 — ABNORMAL LOW
Platelets: 352
Platelets: 385
RBC: 3.75 — ABNORMAL LOW
RDW: 15.9 — ABNORMAL HIGH
RDW: 16.6 — ABNORMAL HIGH
WBC: 10.4
WBC: 8.5

## 2011-03-30 LAB — IRON AND TIBC
Iron: 12 — ABNORMAL LOW
Saturation Ratios: 6 — ABNORMAL LOW
TIBC: 205 — ABNORMAL LOW
TIBC: 215
UIBC: 203

## 2011-03-30 LAB — POTASSIUM
Potassium: 3.1 — ABNORMAL LOW
Potassium: 3.2 — ABNORMAL LOW
Potassium: 3.6

## 2011-03-30 LAB — COMPREHENSIVE METABOLIC PANEL WITH GFR
ALT: 9
AST: 13
Albumin: 2.9 — ABNORMAL LOW
Alkaline Phosphatase: 53
Chloride: 107
Creatinine, Ser: 1.01
GFR calc Af Amer: >60
Potassium: 2.8 — ABNORMAL LOW
Sodium: 143
Total Bilirubin: 0.6

## 2011-03-30 LAB — RETICULOCYTES
RBC.: 3.71 — ABNORMAL LOW
Retic Count, Absolute: 89
Retic Ct Pct: 2.4

## 2011-03-30 LAB — DIFFERENTIAL
Basophils Absolute: 0
Basophils Relative: 1
Eosinophils Absolute: 0.1
Eosinophils Relative: 2
Lymphocytes Relative: 20
Lymphs Abs: 1.7
Monocytes Absolute: 0.4
Monocytes Relative: 5
Neutro Abs: 6.3
Neutrophils Relative %: 73

## 2011-03-30 LAB — FERRITIN
Ferritin: 258 (ref 22–322)
Ferritin: 270 (ref 22–322)

## 2011-03-30 LAB — BLOOD GAS, ARTERIAL
Bicarbonate: 25.7 — ABNORMAL HIGH
O2 Saturation: 93.6
Patient temperature: 98.6
TCO2: 23.9

## 2011-03-30 LAB — OCCULT BLOOD X 1 CARD TO LAB, STOOL: Fecal Occult Bld: NEGATIVE

## 2011-03-30 LAB — HEMOGLOBIN AND HEMATOCRIT, BLOOD
HCT: 26.2 — ABNORMAL LOW
Hemoglobin: 8.8 — ABNORMAL LOW
Hemoglobin: 9.1 — ABNORMAL LOW

## 2011-03-30 LAB — APTT: aPTT: 41 — ABNORMAL HIGH

## 2011-03-30 LAB — FOLATE
Folate: 10.4
Folate: 9.1

## 2011-03-30 LAB — PROTIME-INR
INR: 1.2
Prothrombin Time: 15.8 — ABNORMAL HIGH

## 2011-03-30 LAB — CARDIAC PANEL(CRET KIN+CKTOT+MB+TROPI)
CK, MB: 1
Troponin I: 0.04

## 2011-03-30 LAB — HEMATOCRIT: HCT: 27.7 — ABNORMAL LOW

## 2011-03-30 LAB — CALCIUM: Calcium: 8.7

## 2011-03-30 LAB — VITAMIN B12: Vitamin B-12: 395 (ref 211–911)

## 2011-03-30 LAB — TRANSFERRIN: Transferrin: 164 — ABNORMAL LOW

## 2011-03-30 LAB — HOMOCYSTEINE: Homocysteine: 7.8

## 2011-03-30 LAB — B-NATRIURETIC PEPTIDE (CONVERTED LAB): Pro B Natriuretic peptide (BNP): 668 — ABNORMAL HIGH

## 2011-03-30 LAB — ABO/RH: ABO/RH(D): O POS

## 2011-03-30 NOTE — Op Note (Signed)
[name], [name] NO.:  [id number]  MEDICAL RECORD NO.:  [id number]  LOCATION:  2035                         FACILITY:  [facility]  PHYSICIAN:  Duke Salvia, MD, FACCDATE OF BIRTH:  [birth year]  DATE OF PROCEDURE:  03/11/2011 DATE OF DISCHARGE:                              OPERATIVE REPORT   PREOPERATIVE DIAGNOSIS:  Moderate cardiomyopathy with profound first- degree atrioventricular block.  POSTOPERATIVE DIAGNOSIS:  Moderate cardiomyopathy with profound first- degree atrioventricular block.  PROCEDURE:  Dual-chamber pacemaker implantation with left ventricular lead placement.  Following obtaining informed consent, the patient was brought to the Electrophysiology Laboratory and placed on the fluoroscopic table in supine position.  He has a history of mitral valve mechanical prosthesis and so he was done with a therapeutic INR.  After routine prep and drape of the left upper chest, lidocaine was infiltrated in prepectoral subclavicular region.  An incision was made and carried down to layer of the prepectoral fascia using electrocautery and sharp dissection, a pocket was formed similarly.  Hemostasis was obtained.  Thereafter, attention was turned gaining access to extrathoracic left subclavian vein which was accomplished without difficulty without the aspiration of air or puncture of the artery.  Three separate venipunctures were accomplished.  Guidewires were placed and retained and sequentially 9.5 and 7-French sheath were placed which were passed initially a St. Jude Tendril STS 2088 TCX fixation ventricular lead, 58- cm length, serial number [id number], this lead was marked with a dye and manipulated the right ventricular apex where bipolar R-wave was 14.9 with a pace impedance of 62 and threshold 8.4.  Current threshold was 1.1 MA.  There was no diaphragmatic pacing at 10 volts with a current of injury was brisk.  This lead was secured to the  prepectoral fascia.  We then attempted to deploy the left ventricular lead.  Coronary sinus cannulation with the MB2 catheter and a Wholey wire allowed for ready cannulation of the coronary sinus.  Contrast venogram demonstrated a lateral branch that was very serpiginous.  We able to get a wire and finally and passed the major turn that allowed for the deployment of a St. Jude 1250T8 6-cm lead, PRP [id number].  It actually took about 30 minutes.  And this location with a LV RING TO RV RING CONFIGURATION, the amplitude was 14.5 with a pace impedance of 637, threshold 2.3 volts of 0.4 milliseconds.  Current at threshold 3.8 MA.  There was no diaphragmatic pacing at 10 volts.  The 9.5 French sheath was removed, the amputate was left in place and the right atrial lead was deployed. This was a 2088 52 cm length lead serial number [id number].  It was deployed at the right atrial appendage where bipolar P-wave was 4.5 with a pace impedance of 492 with threshold 0.3 volts at 0.4 milliseconds and current threshold was 2.5 MA.  With these acceptable parameters recorded, the atrial lead was secured, the left ventricular deployment sheath was removed, the lead was secured and the leads were then attached to a St. Jude and some RF pacemaker model 3210 serial number [id number].  P-synchronous pacing and then biventricular P-synchronous pacing were identified.  The pocket was  copiously irrigated with antibiotic containing saline solution.  Hemostasis was assured.  The leads and pulse generator were placed in the pocket and secured to the prepectoral fascia.  Surgicel was placed on the posterior aspect of the pocket with cephalad rim of the pocket.  The wound was then closed in three layers in normal fashion.  Wound was washed, dried and a benzoin Steri-Strip dressing was applied.  Needle counts, sponge, counts and instrument counts were correct at the procedure according to staff.  The patient tolerated  the procedure without apparent complication.     Duke Salvia, MD, [facility]     SCK/MEDQ  D:  03/11/2011  T:  03/11/2011  Job:  [id number]  Electronically Signed by Sherryl Manges MD [facility] on 03/30/2011 04:04:30 PM

## 2011-03-31 ENCOUNTER — Ambulatory Visit: Payer: Medicare Other | Admitting: Internal Medicine

## 2011-03-31 LAB — CBC
MCHC: 32.3
MCV: 78
Platelets: 626 — ABNORMAL HIGH
RBC: 3.89 — ABNORMAL LOW

## 2011-03-31 LAB — BASIC METABOLIC PANEL
BUN: 15
CO2: 29
Calcium: 9.9
Creatinine, Ser: 1.32
GFR calc Af Amer: >60

## 2011-03-31 LAB — DIFFERENTIAL
Basophils Absolute: 0
Basophils Relative: 1
Eosinophils Absolute: 0.1
Monocytes Relative: 10
Neutro Abs: 4.4
Neutrophils Relative %: 58

## 2011-03-31 LAB — POCT CARDIAC MARKERS
CKMB, poc: 2.1
Myoglobin, poc: 164
Operator id: 5362

## 2011-03-31 LAB — B-NATRIURETIC PEPTIDE (CONVERTED LAB): Pro B Natriuretic peptide (BNP): <30

## 2011-04-01 ENCOUNTER — Encounter (HOSPITAL_COMMUNITY): Payer: Medicare Other

## 2011-04-02 LAB — CBC
HCT: 24.6 — ABNORMAL LOW
HCT: 23.5 — ABNORMAL LOW
HCT: 23.6 — ABNORMAL LOW
HCT: 28.2 — ABNORMAL LOW
HCT: 28.7 — ABNORMAL LOW
HCT: 29.6 — ABNORMAL LOW
HCT: 21.2 — ABNORMAL LOW
HCT: 24.3 — ABNORMAL LOW
HCT: 24.7 — ABNORMAL LOW
HCT: 24.7 — ABNORMAL LOW
HCT: 27.1 — ABNORMAL LOW
HCT: 27.1 — ABNORMAL LOW
HCT: 28.4 — ABNORMAL LOW
Hemoglobin: 7.9 — CL
Hemoglobin: 9.6 — ABNORMAL LOW
Hemoglobin: 7.6 — CL
Hemoglobin: 7.8 — CL
Hemoglobin: 7.9 — CL
Hemoglobin: 8.3 — ABNORMAL LOW
Hemoglobin: 9.4 — ABNORMAL LOW
Hemoglobin: 9.6 — ABNORMAL LOW
Hemoglobin: 9.6 — ABNORMAL LOW
Hemoglobin: 9.8 — ABNORMAL LOW
Hemoglobin: 9.9 — ABNORMAL LOW
Hemoglobin: 7.3 — CL
Hemoglobin: 8.4 — ABNORMAL LOW
Hemoglobin: 9.2 — ABNORMAL LOW
Hemoglobin: 9.3 — ABNORMAL LOW
Hemoglobin: 9.7 — ABNORMAL LOW
MCHC: 33.3
MCHC: 32.1
MCHC: 32.4
MCHC: 32.9
MCHC: 31.9
MCHC: 32.2
MCHC: 32.6
MCHC: 33.7
MCHC: 33.7
MCHC: 33.8
MCHC: 34
MCHC: 33.5
MCHC: 33.6
MCHC: 33.7
MCHC: 34.4
MCHC: 34.5
MCV: 77.9 — ABNORMAL LOW
MCV: 78.9
MCV: 82.4
MCV: 86
MCV: 84.4
MCV: 84.8
MCV: 85
MCV: 85.2
MCV: 85.4
MCV: 85.8
MCV: 86
MCV: 86.4
Platelets: 613 — ABNORMAL HIGH
Platelets: 531 — ABNORMAL HIGH
Platelets: 604 — ABNORMAL HIGH
Platelets: 298
Platelets: 335
Platelets: 667 — ABNORMAL HIGH
Platelets: 679 — ABNORMAL HIGH
Platelets: 696 — ABNORMAL HIGH
Platelets: 709 — ABNORMAL HIGH
Platelets: 419 — ABNORMAL HIGH
Platelets: 451 — ABNORMAL HIGH
Platelets: 465 — ABNORMAL HIGH
Platelets: 502 — ABNORMAL HIGH
Platelets: 535 — ABNORMAL HIGH
Platelets: 615 — ABNORMAL HIGH
Platelets: 669 — ABNORMAL HIGH
Platelets: 698 — ABNORMAL HIGH
RBC: 3.88 — ABNORMAL LOW
RBC: 2.85 — ABNORMAL LOW
RBC: 2.87 — ABNORMAL LOW
RBC: 2.89 — ABNORMAL LOW
RBC: 3.11 — ABNORMAL LOW
RBC: 3.77 — ABNORMAL LOW
RBC: 3.86 — ABNORMAL LOW
RBC: 2.86 — ABNORMAL LOW
RBC: 3.18 — ABNORMAL LOW
RBC: 3.27 — ABNORMAL LOW
RBC: 3.29 — ABNORMAL LOW
RBC: 3.29 — ABNORMAL LOW
RDW: 16.4 — ABNORMAL HIGH
RDW: 16.3 — ABNORMAL HIGH
RDW: 16.5 — ABNORMAL HIGH
RDW: 16.6 — ABNORMAL HIGH
RDW: 17.3 — ABNORMAL HIGH
RDW: 17.3 — ABNORMAL HIGH
RDW: 17.4 — ABNORMAL HIGH
RDW: 18.2 — ABNORMAL HIGH
RDW: 18.2 — ABNORMAL HIGH
RDW: 18.3 — ABNORMAL HIGH
RDW: 18.5 — ABNORMAL HIGH
RDW: 18.5 — ABNORMAL HIGH
RDW: 15.4
RDW: 15.4
RDW: 15.4
RDW: 15.7 — ABNORMAL HIGH
RDW: 15.7 — ABNORMAL HIGH
RDW: 15.8 — ABNORMAL HIGH
RDW: 16 — ABNORMAL HIGH
RDW: 16.6 — ABNORMAL HIGH
RDW: 16.8 — ABNORMAL HIGH
WBC: 11.4 — ABNORMAL HIGH
WBC: 13.1 — ABNORMAL HIGH
WBC: 13.7 — ABNORMAL HIGH
WBC: 15.5 — ABNORMAL HIGH
WBC: 17.7 — ABNORMAL HIGH
WBC: 18.5 — ABNORMAL HIGH
WBC: 19.2 — ABNORMAL HIGH
WBC: 22.3 — ABNORMAL HIGH
WBC: 10
WBC: 10.1
WBC: 13.8 — ABNORMAL HIGH
WBC: 14.4 — ABNORMAL HIGH
WBC: 14.8 — ABNORMAL HIGH

## 2011-04-02 LAB — POCT I-STAT 3, ART BLOOD GAS (G3+)
Acid-Base Excess: 1
Acid-Base Excess: 3 — ABNORMAL HIGH
Acid-Base Excess: 4 — ABNORMAL HIGH
Bicarbonate: 22.2
Bicarbonate: 25.9 — ABNORMAL HIGH
O2 Saturation: 100
O2 Saturation: 99
Operator id: 256671
Operator id: 288291
Operator id: 300061
Operator id: 3342
Operator id: 3402
Patient temperature: 98
TCO2: 28
TCO2: 28
pCO2 arterial: 35.3
pCO2 arterial: 44.9
pCO2 arterial: 47.2 — ABNORMAL HIGH
pH, Arterial: 7.435
pH, Arterial: 7.525 — ABNORMAL HIGH
pO2, Arterial: 144 — ABNORMAL HIGH
pO2, Arterial: 193 — ABNORMAL HIGH
pO2, Arterial: 342 — ABNORMAL HIGH
pO2, Arterial: 88

## 2011-04-02 LAB — COMPREHENSIVE METABOLIC PANEL
ALT: 48
ALT: 20
ALT: 47
ALT: 32
ALT: 33
AST: 50 — ABNORMAL HIGH
AST: 26
AST: 31
AST: 34
AST: 36
AST: 38 — ABNORMAL HIGH
Albumin: 2.4 — ABNORMAL LOW
Albumin: 2.8 — ABNORMAL LOW
Albumin: 2.5 — ABNORMAL LOW
Albumin: 2.8 — ABNORMAL LOW
Albumin: 2.8 — ABNORMAL LOW
Alkaline Phosphatase: 79
Alkaline Phosphatase: 69
Alkaline Phosphatase: 72
Alkaline Phosphatase: 111
Alkaline Phosphatase: 93
BUN: 25 — ABNORMAL HIGH
CO2: 29
CO2: 27
Calcium: 8.9
Calcium: 9.2
Calcium: 8.8
Calcium: 9.1
Chloride: 101
Chloride: 101
Chloride: 103
Chloride: 103
Chloride: 92 — ABNORMAL LOW
Chloride: 93 — ABNORMAL LOW
Creatinine, Ser: 0.91
Creatinine, Ser: 1.61 — ABNORMAL HIGH
Creatinine, Ser: 1.62 — ABNORMAL HIGH
GFR calc Af Amer: >60
GFR calc Af Amer: >60
GFR calc Af Amer: >60
GFR calc Af Amer: 49 — ABNORMAL LOW
GFR calc Af Amer: 51 — ABNORMAL LOW
GFR calc Af Amer: 52 — ABNORMAL LOW
GFR calc non Af Amer: >60
Glucose, Bld: 110 — ABNORMAL HIGH
Glucose, Bld: 108 — ABNORMAL HIGH
Glucose, Bld: 87
Potassium: 3.8
Potassium: 4.1
Potassium: 4.1
Potassium: 4
Potassium: 4.1
Potassium: 4.1
Sodium: 135
Sodium: 138
Sodium: 135
Sodium: 138
Sodium: 131 — ABNORMAL LOW
Sodium: 133 — ABNORMAL LOW
Total Bilirubin: 0.7
Total Bilirubin: 0.7
Total Bilirubin: 0.8
Total Bilirubin: 1.7 — ABNORMAL HIGH
Total Bilirubin: 2.1 — ABNORMAL HIGH
Total Protein: 6.4
Total Protein: 6.2
Total Protein: 5.6 — ABNORMAL LOW
Total Protein: 5.6 — ABNORMAL LOW
Total Protein: 6
Total Protein: 6.3

## 2011-04-02 LAB — DIFFERENTIAL
Basophils Absolute: 0.1
Basophils Relative: 1
Basophils Relative: 0
Eosinophils Absolute: 0.1
Eosinophils Absolute: 0.2
Eosinophils Relative: 1
Lymphs Abs: 1.5
Monocytes Absolute: 0.7
Monocytes Absolute: 1.5 — ABNORMAL HIGH
Monocytes Relative: 10

## 2011-04-02 LAB — GENTAMICIN LEVEL, TROUGH
Gentamicin Trough: 1.1
Gentamicin Trough: 2.6

## 2011-04-02 LAB — CROSSMATCH
ABO/RH(D): O POS
ABO/RH(D): O POS
ABO/RH(D): O POS
ABO/RH(D): O POS
Antibody Screen: NEGATIVE
Antibody Screen: NEGATIVE

## 2011-04-02 LAB — BASIC METABOLIC PANEL
BUN: 10
BUN: 11
BUN: 13
BUN: 15
BUN: 9
BUN: 15
BUN: 17
BUN: 19
BUN: 20
BUN: 24 — ABNORMAL HIGH
BUN: 24 — ABNORMAL HIGH
CO2: 27
CO2: 29
CO2: 28
CO2: 29
CO2: 29
CO2: 29
CO2: 29
CO2: 26
CO2: 28
CO2: 28
CO2: 28
CO2: 29
Calcium: 8.6
Calcium: 9
Calcium: 8.1 — ABNORMAL LOW
Calcium: 8.6
Calcium: 8.6
Calcium: 8.7
Calcium: 9.4
Calcium: 9.4
Calcium: 9.8
Calcium: 8.5
Calcium: 8.5
Calcium: 8.9
Calcium: 9
Chloride: 102
Chloride: 103
Chloride: 103
Chloride: 105
Chloride: 99
Chloride: 91 — ABNORMAL LOW
Chloride: 91 — ABNORMAL LOW
Chloride: 95 — ABNORMAL LOW
Chloride: 96
Chloride: 97
Chloride: 99
Creatinine, Ser: 1.1
Creatinine, Ser: 0.98
Creatinine, Ser: 0.99
Creatinine, Ser: 1.51 — ABNORMAL HIGH
Creatinine, Ser: 1.55 — ABNORMAL HIGH
Creatinine, Ser: 1.47
Creatinine, Ser: 1.51 — ABNORMAL HIGH
Creatinine, Ser: 1.52 — ABNORMAL HIGH
Creatinine, Ser: 1.57 — ABNORMAL HIGH
Creatinine, Ser: 1.58 — ABNORMAL HIGH
GFR calc Af Amer: >60
GFR calc Af Amer: 57 — ABNORMAL LOW
GFR calc Af Amer: 57 — ABNORMAL LOW
GFR calc Af Amer: 58 — ABNORMAL LOW
GFR calc Af Amer: 58 — ABNORMAL LOW
GFR calc Af Amer: >60
GFR calc Af Amer: >60
GFR calc Af Amer: >60
GFR calc Af Amer: 47 — ABNORMAL LOW
GFR calc Af Amer: 58 — ABNORMAL LOW
GFR calc Af Amer: 59 — ABNORMAL LOW
GFR calc non Af Amer: 47 — ABNORMAL LOW
GFR calc non Af Amer: 47 — ABNORMAL LOW
GFR calc non Af Amer: 48 — ABNORMAL LOW
GFR calc non Af Amer: 51 — ABNORMAL LOW
GFR calc non Af Amer: >60
GFR calc non Af Amer: >60
GFR calc non Af Amer: 44 — ABNORMAL LOW
GFR calc non Af Amer: 44 — ABNORMAL LOW
GFR calc non Af Amer: 46 — ABNORMAL LOW
GFR calc non Af Amer: 47 — ABNORMAL LOW
Glucose, Bld: 102 — ABNORMAL HIGH
Glucose, Bld: 94
Glucose, Bld: 112 — ABNORMAL HIGH
Glucose, Bld: 129 — ABNORMAL HIGH
Glucose, Bld: 91
Glucose, Bld: 96
Glucose, Bld: 101 — ABNORMAL HIGH
Glucose, Bld: 105 — ABNORMAL HIGH
Glucose, Bld: 107 — ABNORMAL HIGH
Glucose, Bld: 119 — ABNORMAL HIGH
Glucose, Bld: 87
Glucose, Bld: 88
Potassium: 3.7
Potassium: 3.7
Potassium: 4.1
Potassium: 4.4
Potassium: 4.5
Potassium: 3.9
Potassium: 4.2
Potassium: 4.2
Potassium: 4.9
Sodium: 134 — ABNORMAL LOW
Sodium: 133 — ABNORMAL LOW
Sodium: 135
Sodium: 136
Sodium: 136
Sodium: 136
Sodium: 136
Sodium: 136
Sodium: 137
Sodium: 131 — ABNORMAL LOW
Sodium: 133 — ABNORMAL LOW
Sodium: 133 — ABNORMAL LOW

## 2011-04-02 LAB — PREPARE FRESH FROZEN PLASMA

## 2011-04-02 LAB — BLOOD GAS, ARTERIAL
Acid-Base Excess: 2.4 — ABNORMAL HIGH
Bicarbonate: 25.1 — ABNORMAL HIGH
Bicarbonate: 27.9 — ABNORMAL HIGH
Drawn by: 275301
O2 Saturation: 97.3
TCO2: 29.1
pCO2 arterial: 31.5 — ABNORMAL LOW
pCO2 arterial: 38.7
pH, Arterial: 7.472 — ABNORMAL HIGH
pO2, Arterial: 85.9
pO2, Arterial: 84.6

## 2011-04-02 LAB — TYPE AND SCREEN
ABO/RH(D): O POS
Antibody Screen: NEGATIVE

## 2011-04-02 LAB — POCT I-STAT 4, (NA,K, GLUC, HGB,HCT)
Glucose, Bld: 132 — ABNORMAL HIGH
Glucose, Bld: 152 — ABNORMAL HIGH
HCT: 26 — ABNORMAL LOW
HCT: 26 — ABNORMAL LOW
HCT: 27 — ABNORMAL LOW
HCT: 30 — ABNORMAL LOW
HCT: 33 — ABNORMAL LOW
Hemoglobin: 10.2 — ABNORMAL LOW
Hemoglobin: 11.2 — ABNORMAL LOW
Hemoglobin: 8.8 — ABNORMAL LOW
Hemoglobin: 9.2 — ABNORMAL LOW
Operator id: 288291
Operator id: 3402
Operator id: 3402
Potassium: 4
Potassium: 4.1
Potassium: 4.4
Potassium: 5
Sodium: 131 — ABNORMAL LOW
Sodium: 133 — ABNORMAL LOW
Sodium: 134 — ABNORMAL LOW
Sodium: 135

## 2011-04-02 LAB — TISSUE CULTURE: Culture: NO GROWTH

## 2011-04-02 LAB — CULTURE, BLOOD (ROUTINE X 2)

## 2011-04-02 LAB — LIPASE, BLOOD: Lipase: 12

## 2011-04-02 LAB — URINALYSIS, ROUTINE W REFLEX MICROSCOPIC
Bilirubin Urine: NEGATIVE
Bilirubin Urine: NEGATIVE
Glucose, UA: NEGATIVE
Hgb urine dipstick: NEGATIVE
Hgb urine dipstick: NEGATIVE
Specific Gravity, Urine: 1.011
Specific Gravity, Urine: 1.007
Urobilinogen, UA: 0.2
pH: 7.5
pH: 7.5

## 2011-04-02 LAB — POCT I-STAT 3, VENOUS BLOOD GAS (G3P V)
Acid-Base Excess: 2
Acid-Base Excess: 3 — ABNORMAL HIGH
Bicarbonate: 27.3 — ABNORMAL HIGH
O2 Saturation: 62
Operator id: 256671
Operator id: 256671
pH, Ven: 7.439 — ABNORMAL HIGH
pO2, Ven: 32

## 2011-04-02 LAB — FUNGAL SUSCEPTIBILITY

## 2011-04-02 LAB — PROTIME-INR
INR: 1.2
INR: 1.3
INR: 1.6 — ABNORMAL HIGH
INR: 1.9 — ABNORMAL HIGH
INR: 2.2 — ABNORMAL HIGH
INR: 2.3 — ABNORMAL HIGH
INR: 2.3 — ABNORMAL HIGH
INR: 2.5 — ABNORMAL HIGH
INR: 3 — ABNORMAL HIGH
INR: 4.1 — ABNORMAL HIGH
Prothrombin Time: 22.6 — ABNORMAL HIGH
Prothrombin Time: 25.6 — ABNORMAL HIGH
Prothrombin Time: 25.9 — ABNORMAL HIGH
Prothrombin Time: 18.6 — ABNORMAL HIGH
Prothrombin Time: 18.8 — ABNORMAL HIGH
Prothrombin Time: 26.9 — ABNORMAL HIGH
Prothrombin Time: 28.9 — ABNORMAL HIGH
Prothrombin Time: 29 — ABNORMAL HIGH
Prothrombin Time: 41.6 — ABNORMAL HIGH

## 2011-04-02 LAB — BODY FLUID CULTURE
Culture: NO GROWTH
Culture: NO GROWTH

## 2011-04-02 LAB — HEPATIC FUNCTION PANEL
Alkaline Phosphatase: 72
Indirect Bilirubin: 1.1 — ABNORMAL HIGH
Total Bilirubin: 1.5 — ABNORMAL HIGH

## 2011-04-02 LAB — CREATININE, SERUM
Creatinine, Ser: 1.01
GFR calc non Af Amer: 56 — ABNORMAL LOW
GFR calc non Af Amer: >60

## 2011-04-02 LAB — TROPONIN I
Troponin I: 0.05
Troponin I: 0.06

## 2011-04-02 LAB — CARDIAC PANEL(CRET KIN+CKTOT+MB+TROPI)
Relative Index: INVALID
Total CK: 12
Troponin I: 0.04

## 2011-04-02 LAB — URINALYSIS, MICROSCOPIC ONLY
Bilirubin Urine: NEGATIVE
Glucose, UA: NEGATIVE
Specific Gravity, Urine: 1.011
Urobilinogen, UA: 0.2
pH: 5.5

## 2011-04-02 LAB — CULTURE, RESPIRATORY W GRAM STAIN

## 2011-04-02 LAB — CK TOTAL AND CKMB (NOT AT ARMC)
CK, MB: 0.6
CK, MB: 0.9
Relative Index: INVALID
Total CK: 17
Total CK: 29

## 2011-04-02 LAB — URINE CULTURE: Special Requests: NEGATIVE

## 2011-04-02 LAB — VANCOMYCIN, TROUGH: Vancomycin Tr: 12.8

## 2011-04-02 LAB — POCT I-STAT, CHEM 8
BUN: 10
Chloride: 102
Creatinine, Ser: <0.2 — ABNORMAL LOW
Glucose, Bld: 134 — ABNORMAL HIGH
HCT: 25 — ABNORMAL LOW
HCT: 22 — ABNORMAL LOW
Hemoglobin: 7.5 — CL
Potassium: 3.7
Potassium: 3.9
Sodium: 138

## 2011-04-02 LAB — RENAL FUNCTION PANEL
Albumin: 2.4 — ABNORMAL LOW
CO2: 27
Calcium: 8.6
Creatinine, Ser: 0.98
GFR calc Af Amer: >60
GFR calc non Af Amer: >60
Phosphorus: 3.8
Sodium: 133 — ABNORMAL LOW

## 2011-04-02 LAB — OCCULT BLOOD X 1 CARD TO LAB, STOOL
Fecal Occult Bld: NEGATIVE
Fecal Occult Bld: POSITIVE

## 2011-04-02 LAB — LIPID PANEL
Cholesterol: 132
HDL: 24 — ABNORMAL LOW
Triglycerides: 91

## 2011-04-02 LAB — LEGIONELLA ANTIGEN, URINE

## 2011-04-02 LAB — ANAEROBIC CULTURE

## 2011-04-02 LAB — SEDIMENTATION RATE: Sed Rate: 30 — ABNORMAL HIGH

## 2011-04-02 LAB — GRAM STAIN

## 2011-04-02 LAB — EXPECTORATED SPUTUM ASSESSMENT W GRAM STAIN, RFLX TO RESP C

## 2011-04-02 LAB — PREPARE PLATELET PHERESIS

## 2011-04-02 LAB — APTT
aPTT: 41 — ABNORMAL HIGH
aPTT: 48 — ABNORMAL HIGH

## 2011-04-02 LAB — VANCOMYCIN, RANDOM: Vancomycin Rm: 11.9

## 2011-04-02 LAB — PREPARE RBC (CROSSMATCH)

## 2011-04-02 LAB — IRON AND TIBC
Saturation Ratios: 6 — ABNORMAL LOW
TIBC: 186 — ABNORMAL LOW

## 2011-04-02 LAB — CARDIOLIPIN ANTIBODIES, IGG, IGM, IGA
Anticardiolipin IgA: <7 — ABNORMAL LOW (ref <13)
Anticardiolipin IgG: <7 — ABNORMAL LOW (ref <11)
Anticardiolipin IgM: <7 — ABNORMAL LOW (ref <10)

## 2011-04-02 LAB — HEMOGLOBIN AND HEMATOCRIT, BLOOD: HCT: 26.3 — ABNORMAL LOW

## 2011-04-02 LAB — MAGNESIUM
Magnesium: 2.5
Magnesium: 2.5
Magnesium: 2.5

## 2011-04-02 LAB — LUPUS ANTICOAGULANT PANEL
Lupus Anticoagulant: DETECTED — AB
PTTLA Confirmation: 0.5 (ref <8.0)

## 2011-04-02 LAB — CLOSTRIDIUM DIFFICILE EIA

## 2011-04-02 LAB — MISCELLANEOUS TEST

## 2011-04-02 LAB — B-NATRIURETIC PEPTIDE (CONVERTED LAB): Pro B Natriuretic peptide (BNP): 657 — ABNORMAL HIGH

## 2011-04-02 LAB — BARTONELLA ANTIBODY PANEL: B henselae IgG: <1:64 {titer}

## 2011-04-02 LAB — Q FEVER ABS IGG, IGM W/ REFLEX TITER: Q Fever Phase II: <1:16 {titer}

## 2011-04-03 LAB — HEPATIC FUNCTION PANEL
AST: 161 — ABNORMAL HIGH
Albumin: 3.8
Alkaline Phosphatase: 60
Total Bilirubin: 3.1 — ABNORMAL HIGH

## 2011-04-03 LAB — CROSSMATCH
ABO/RH(D): O POS
Antibody Screen: NEGATIVE
Antibody Screen: NEGATIVE

## 2011-04-03 LAB — COMPREHENSIVE METABOLIC PANEL
ALT: 26
ALT: 26
AST: 24
AST: 71 — ABNORMAL HIGH
AST: 81 — ABNORMAL HIGH
Albumin: 3.1 — ABNORMAL LOW
Albumin: 3.3 — ABNORMAL LOW
Alkaline Phosphatase: 51
Alkaline Phosphatase: 52
BUN: 15
BUN: 38 — ABNORMAL HIGH
BUN: 45 — ABNORMAL HIGH
CO2: 28
CO2: 27
CO2: 27
Calcium: 8.7
Calcium: 8.8
Calcium: 8.9
Calcium: 9
Creatinine, Ser: 1.57 — ABNORMAL HIGH
Creatinine, Ser: 2.19 — ABNORMAL HIGH
Creatinine, Ser: 2.35 — ABNORMAL HIGH
Creatinine, Ser: 2.48 — ABNORMAL HIGH
GFR calc Af Amer: 54 — ABNORMAL LOW
GFR calc Af Amer: 32 — ABNORMAL LOW
GFR calc Af Amer: 37 — ABNORMAL LOW
GFR calc non Af Amer: 45 — ABNORMAL LOW
GFR calc non Af Amer: 26 — ABNORMAL LOW
GFR calc non Af Amer: 30 — ABNORMAL LOW
Glucose, Bld: 136 — ABNORMAL HIGH
Glucose, Bld: 89
Glucose, Bld: 92
Potassium: 3.3 — ABNORMAL LOW
Sodium: 134 — ABNORMAL LOW
Sodium: 138
Total Bilirubin: 1.2
Total Protein: 6.1
Total Protein: 6.2
Total Protein: 6.3

## 2011-04-03 LAB — CBC
HCT: 23.5 — ABNORMAL LOW
HCT: 26 — ABNORMAL LOW
HCT: 19.4 — ABNORMAL LOW
HCT: 22.3 — ABNORMAL LOW
HCT: 22.5 — ABNORMAL LOW
HCT: 31.8 — ABNORMAL LOW
Hemoglobin: 8 — ABNORMAL LOW
Hemoglobin: 8.1 — ABNORMAL LOW
Hemoglobin: 8.7 — ABNORMAL LOW
Hemoglobin: 5.6 — CL
Hemoglobin: 6.7 — CL
Hemoglobin: 7.7 — CL
Hemoglobin: 7.7 — CL
MCHC: 33.3
MCHC: 34
MCHC: 34.2
MCHC: 34.3
MCHC: 34.4
MCHC: 34.6
MCV: 84.1
MCV: 84.5
MCV: 85.2
MCV: 85.9
MCV: 86
MCV: 86.3
MCV: 86.6
MCV: 86.7
Platelets: 700 — ABNORMAL HIGH
Platelets: 735 — ABNORMAL HIGH
Platelets: 350
Platelets: 374
Platelets: 396
Platelets: 407 — ABNORMAL HIGH
Platelets: 422 — ABNORMAL HIGH
Platelets: 438 — ABNORMAL HIGH
Platelets: 567 — ABNORMAL HIGH
RBC: 2.82 — ABNORMAL LOW
RBC: 2.92 — ABNORMAL LOW
RBC: 1.98 — ABNORMAL LOW
RBC: 2.77 — ABNORMAL LOW
RBC: 2.83 — ABNORMAL LOW
RDW: 15.7 — ABNORMAL HIGH
RDW: 18.9 — ABNORMAL HIGH
RDW: 19.9 — ABNORMAL HIGH
RDW: 21 — ABNORMAL HIGH
RDW: 23.4 — ABNORMAL HIGH
RDW: 33.3 — ABNORMAL HIGH
WBC: 9.8
WBC: 6.6
WBC: 6.9
WBC: 9.8

## 2011-04-03 LAB — OSMOLALITY: Osmolality: 285

## 2011-04-03 LAB — BASIC METABOLIC PANEL
BUN: 16
BUN: 18
BUN: 41 — ABNORMAL HIGH
BUN: 43 — ABNORMAL HIGH
BUN: 44 — ABNORMAL HIGH
BUN: 47 — ABNORMAL HIGH
CO2: 27
CO2: 27
CO2: 25
Calcium: 8.6
Calcium: 8.7
Calcium: 8.7
Calcium: 9
Calcium: 9.3
Calcium: 9.7
Chloride: 96
Chloride: 97
Chloride: 103
Chloride: 103
Chloride: 96
Creatinine, Ser: 1.99 — ABNORMAL HIGH
Creatinine, Ser: 2 — ABNORMAL HIGH
Creatinine, Ser: 2.08 — ABNORMAL HIGH
Creatinine, Ser: 2.1 — ABNORMAL HIGH
Creatinine, Ser: 2.19 — ABNORMAL HIGH
Creatinine, Ser: 2.5 — ABNORMAL HIGH
GFR calc Af Amer: 41 — ABNORMAL LOW
GFR calc Af Amer: 42 — ABNORMAL LOW
GFR calc Af Amer: 32 — ABNORMAL LOW
GFR calc Af Amer: 39 — ABNORMAL LOW
GFR calc Af Amer: 39 — ABNORMAL LOW
GFR calc Af Amer: 41 — ABNORMAL LOW
GFR calc non Af Amer: 32 — ABNORMAL LOW
GFR calc non Af Amer: 34 — ABNORMAL LOW
GFR calc non Af Amer: 34 — ABNORMAL LOW
GFR calc non Af Amer: 26 — ABNORMAL LOW
GFR calc non Af Amer: 26 — ABNORMAL LOW
GFR calc non Af Amer: 30 — ABNORMAL LOW
GFR calc non Af Amer: 32 — ABNORMAL LOW
GFR calc non Af Amer: 34 — ABNORMAL LOW
Glucose, Bld: 78
Glucose, Bld: 86
Glucose, Bld: 131 — ABNORMAL HIGH
Glucose, Bld: 143 — ABNORMAL HIGH
Potassium: 4.2
Potassium: 4.5
Potassium: 4.7
Potassium: 3.9
Potassium: 4
Sodium: 132 — ABNORMAL LOW
Sodium: 133 — ABNORMAL LOW
Sodium: 134 — ABNORMAL LOW

## 2011-04-03 LAB — PREPARE FRESH FROZEN PLASMA

## 2011-04-03 LAB — CREATININE CLEARANCE, URINE, 24 HOUR
Creatinine Clearance: 22 — ABNORMAL LOW
Creatinine, 24H Ur: 791 — ABNORMAL LOW
Creatinine, Urine: 29.3

## 2011-04-03 LAB — NA AND K (SODIUM & POTASSIUM), 24 H UR
Sodium, 24H Ur: 273 — ABNORMAL HIGH
Sodium, Ur: 101
Urine Total Volume-UNAK24: 2700

## 2011-04-03 LAB — CULTURE, BLOOD (ROUTINE X 2)
Culture: NO GROWTH
Culture: NO GROWTH

## 2011-04-03 LAB — URINALYSIS, ROUTINE W REFLEX MICROSCOPIC
Bilirubin Urine: NEGATIVE
Glucose, UA: NEGATIVE
Ketones, ur: NEGATIVE
Nitrite: NEGATIVE
Protein, ur: 100 — AB
Specific Gravity, Urine: 1.017
pH: 7.5

## 2011-04-03 LAB — DIFFERENTIAL
Eosinophils Relative: 1
Eosinophils Relative: 2
Lymphocytes Relative: 20
Lymphocytes Relative: 22
Lymphs Abs: 1.5
Monocytes Absolute: 0.6
Monocytes Absolute: 0.8
Monocytes Relative: 11
Neutro Abs: 4.7
Neutrophils Relative %: 66

## 2011-04-03 LAB — IRON AND TIBC
Saturation Ratios: 28
Saturation Ratios: 35
TIBC: 218

## 2011-04-03 LAB — PROTIME-INR
INR: 1.7 — ABNORMAL HIGH
INR: 1.7 — ABNORMAL HIGH
INR: 1.8 — ABNORMAL HIGH
INR: 2.1 — ABNORMAL HIGH
INR: 1.4
INR: 1.6 — ABNORMAL HIGH
INR: 1.7 — ABNORMAL HIGH
INR: 1.8 — ABNORMAL HIGH
INR: 2.5 — ABNORMAL HIGH
INR: 2.6 — ABNORMAL HIGH
INR: 2.7 — ABNORMAL HIGH
Prothrombin Time: 20.7 — ABNORMAL HIGH
Prothrombin Time: 25.1 — ABNORMAL HIGH
Prothrombin Time: 26.4 — ABNORMAL HIGH
Prothrombin Time: 18 — ABNORMAL HIGH
Prothrombin Time: 19.6 — ABNORMAL HIGH
Prothrombin Time: 21.2 — ABNORMAL HIGH
Prothrombin Time: 21.9 — ABNORMAL HIGH
Prothrombin Time: 29.2 — ABNORMAL HIGH

## 2011-04-03 LAB — PREPARE RBC (CROSSMATCH)

## 2011-04-03 LAB — CARDIAC PANEL(CRET KIN+CKTOT+MB+TROPI)
CK, MB: 1.4
Relative Index: INVALID
Troponin I: 0.07 — ABNORMAL HIGH

## 2011-04-03 LAB — URINE MICROSCOPIC-ADD ON

## 2011-04-03 LAB — HEMOGLOBIN AND HEMATOCRIT, BLOOD
HCT: 19.8 — ABNORMAL LOW
HCT: 28 — ABNORMAL LOW
Hemoglobin: 11.1 — ABNORMAL LOW
Hemoglobin: 6.3 — CL
Hemoglobin: 6.7 — CL
Hemoglobin: 9.1 — ABNORMAL LOW
Hemoglobin: 9.1 — ABNORMAL LOW
Hemoglobin: 9.6 — ABNORMAL LOW

## 2011-04-03 LAB — FOLATE RBC: RBC Folate: 1734 — ABNORMAL HIGH

## 2011-04-03 LAB — MAGNESIUM: Magnesium: 2.4

## 2011-04-03 LAB — URINE CULTURE
Colony Count: NO GROWTH
Culture: NO GROWTH

## 2011-04-03 LAB — PHOSPHORUS: Phosphorus: 4.8 — ABNORMAL HIGH

## 2011-04-03 LAB — LACTATE DEHYDROGENASE: LDH: 1154 — ABNORMAL HIGH

## 2011-04-03 LAB — VITAMIN B12: Vitamin B-12: 470 (ref 211–911)

## 2011-04-03 LAB — OCCULT BLOOD X 1 CARD TO LAB, STOOL: Fecal Occult Bld: NEGATIVE

## 2011-04-03 LAB — B-NATRIURETIC PEPTIDE (CONVERTED LAB): Pro B Natriuretic peptide (BNP): 371 — ABNORMAL HIGH

## 2011-04-06 LAB — CBC
HCT: 30.3 — ABNORMAL LOW
Hemoglobin: 10.1 — ABNORMAL LOW
MCV: 92.2
RDW: 16.6 — ABNORMAL HIGH

## 2011-04-06 LAB — COMPREHENSIVE METABOLIC PANEL
ALT: 19
AST: 35
CO2: 25
Chloride: 104
Creatinine, Ser: 1.75 — ABNORMAL HIGH
GFR calc Af Amer: 48 — ABNORMAL LOW
GFR calc non Af Amer: 39 — ABNORMAL LOW
Sodium: 139
Total Bilirubin: 0.9

## 2011-04-07 ENCOUNTER — Other Ambulatory Visit: Payer: Self-pay | Admitting: Nephrology

## 2011-04-07 ENCOUNTER — Encounter (HOSPITAL_COMMUNITY): Payer: Medicare Other | Attending: Nephrology

## 2011-04-07 ENCOUNTER — Encounter: Payer: Medicare Other | Admitting: *Deleted

## 2011-04-07 DIAGNOSIS — D638 Anemia in other chronic diseases classified elsewhere: Secondary | ICD-10-CM | POA: Insufficient documentation

## 2011-04-07 DIAGNOSIS — N183 Chronic kidney disease, stage 3 unspecified: Secondary | ICD-10-CM | POA: Insufficient documentation

## 2011-04-13 LAB — POCT URINALYSIS DIP (DEVICE)
Ketones, ur: 40 — AB
pH: 7.5

## 2011-04-14 LAB — URINE MICROSCOPIC-ADD ON

## 2011-04-14 LAB — URINALYSIS, ROUTINE W REFLEX MICROSCOPIC
Bilirubin Urine: NEGATIVE
Hgb urine dipstick: NEGATIVE
Specific Gravity, Urine: 1.021
pH: 7

## 2011-04-14 LAB — URINE CULTURE

## 2011-04-16 NOTE — Discharge Summary (Signed)
NAMELEVY, CEDANO NO.:  [id number]  MEDICAL RECORD NO.:  [id number]  LOCATION:  2035                         FACILITY:  [facility]  PHYSICIAN:  Doylene Canning. Ladona Ridgel, MD    DATE OF BIRTH:  December 20, 1945  DATE OF ADMISSION:  03/11/2011 DATE OF DISCHARGE:  03/12/2011                              DISCHARGE SUMMARY   PRIMARY CARE PHYSICIAN:  Maurice March, MD, at [facility].  PRIMARY CARDIOLOGIST:  Bevelyn Buckles. Bensimhon, MD  ELECTROPHYSIOLOGIST:  Duke Salvia, MD, [facility]  PRIMARY DIAGNOSIS:  Secondary cardiomyopathy, valvular with prolonged first-degree atrioventricular block and congestive heart failure due to diastolic dysfunction.  The patient is status post cardiac resynchronization therapy pacemaker implant this admission.  SECONDARY DIAGNOSES: 1. History of bacterial endocarditis due to intravenous drug abuse     with subsequent St. Jude mitral valve replacement in June 2009.     Echo in January 2010 demonstrated an ejection fraction of 55% with     mild periprosthetic mitral valve regurgitation with high     transmitral gradient.  The patient had normal coronary arteries by     cath in June 2009.  Echocardiogram in August 2012 demonstrated an     ejection fraction of 35% with moderately to severely dilated left     atrium.  Left atrial size was 56 mm. 2. Atrial fibrillation status post cardioversion in October 2009,     previously on amiodarone therapy. 3. History of atrial fibrillation or atrial flutter. 4. History of heavy alcohol use. 5. History of intravenous drug use. 6. History of gastrointestinal bleed. 7. Hypertension. 8. Gastroesophageal reflux disease. 9. Chronic back pain. 10.History of dysphagia. 11.Chronic anticoagulation with warfarin.  ALLERGIES:  The patient has no known drug allergies.  PROCEDURES THIS ADMISSION: 1. Implantation of a cardiac resynchronization therapy pacemaker on     March 11, 2011, by Dr. Graciela Husbands.  The patient  received a St. Jude     Medical, model number 3210, pacemaker with model number [id number]     right atrial and right ventricular leads.  The patient's left     ventricular lead was model number 1258.  The patient had no early     apparent complications. 2. A chest x-ray on March 12, 2011, post device implant.  This has     not been read yet by the radiologist but will be reviewed by Dr.     Ladona Ridgel prior to discharge.  BRIEF HISTORY OF PRESENT ILLNESS:  Mr. Mccleery is a 65 year old male with a history of polysubstance abuse resulting in mitral valve endocarditis status post St. Jude MVR.  The patient also has congestive heart failure due to nonischemic cardiomyopathy with the last documented ejection fraction of 35%.  He also has a significantly prolonged first- degree AV block at 450 milliseconds.  He was evaluated by Dr. Graciela Husbands in the outpatient setting for options for device therapy.  Because of the patient's progressive first-degree AV block with associated worsening fatigue, it was felt that cardiac resynchronization therapy was appropriate with the hope that this would restore AV synchrony.  Risks, benefits, and alternatives of this procedure were discussed with the patient and he wished to  proceed.  HOSPITAL COURSE:  The patient was admitted on March 11, 2011, for planned implantation of a cardiac resynchronization therapy pacemaker. This was carried out by Dr. Graciela Husbands with details as outlined above.  He was monitored on telemetry overnight which showed sinus rhythm with biventricular pacing.  His device was interrogated and found to be functioning normally.  Chest x-ray was obtained.  The results are pending at the time of this dictation but will be reviewed Dr. Ladona Ridgel prior to discharge.  The patient's INR on date of discharge was 2.83. His left chest was without hematoma or ecchymosis.  Wound care and arm mobility were reviewed with the patient.  The patient has his  Coumadin followed up at [facility] with Dr. Audria Nine.  He has been advised to have this checked early next week.  DISCHARGE INSTRUCTIONS: 1. Increase activity slowly. 2. No driving for one week. 3. Follow low-sodium heart-healthy diet. 4. See supplemental device discharge instructions for wound care and     arm mobility. 5. Keep incisions clean and dry for 1 week.  FOLLOWUP APPOINTMENTS: 1. [facility] on March 23, 2011, at 3:30     p.m. 2. Dr. Gala Romney with the Heart Failure Clinic on June 02, 2011,     at 11:00 a.m. 3. Dr. Graciela Husbands on June 22, 2011, at 1:45 p.m. 4. Dr. Audria Nine as scheduled. 5. [facility] early next week for INR check.  DISCHARGE MEDICATIONS: 1. Coreg 3.125 mg 1 tablet twice daily - this is a new prescription     for the patient. 2. Klor-Con 20 mEq daily. 3. Lisinopril 20 mg daily. 4. Omeprazole 20 mg daily. 5. Tramadol 50 mg 1 tablet every 8 hours as needed for pain. 6. Warfarin 5 mg, take 1 tablet daily until seen by [facility].  DISPOSITION:  The patient was seen and examined by Dr. Ladona Ridgel on March 12, 2011, and considered stable for discharge.  DURATION OF DISCHARGE ENCOUNTER:  Thirty-five minutes.     Gypsy Balsam, RN,BSN   ______________________________ Doylene Canning. Ladona Ridgel, MD    AS/MEDQ  D:  03/12/2011  T:  03/12/2011  Job:  [id number]  cc:   Bevelyn Buckles. Bensimhon, MD Maurice March, M.D.  Electronically Signed by Gypsy Balsam RNBSN on 03/26/2011 09:23:30 AM Electronically Signed by Lewayne Bunting MD on 04/16/2011 06:41:30 PM

## 2011-04-21 ENCOUNTER — Encounter (HOSPITAL_COMMUNITY): Payer: Medicare Other

## 2011-04-21 ENCOUNTER — Other Ambulatory Visit: Payer: Self-pay | Admitting: *Deleted

## 2011-04-21 MED ORDER — LISINOPRIL 20 MG PO TABS: 20.0000 mg | ORAL_TABLET | Freq: Every day | ORAL | Status: DC

## 2011-05-05 ENCOUNTER — Encounter (HOSPITAL_COMMUNITY)
Admission: RE | Admit: 2011-05-05 | Discharge: 2011-05-05 | Payer: Medicare Other | Source: Ambulatory Visit | Attending: Nephrology | Admitting: Nephrology

## 2011-05-05 ENCOUNTER — Other Ambulatory Visit: Payer: Self-pay | Admitting: Nephrology

## 2011-05-05 LAB — POCT HEMOGLOBIN-HEMACUE: Hemoglobin: 11.5 g/dL — ABNORMAL LOW (ref 13.0–17.0)

## 2011-06-01 ENCOUNTER — Other Ambulatory Visit (HOSPITAL_COMMUNITY): Payer: Self-pay | Admitting: *Deleted

## 2011-06-02 ENCOUNTER — Encounter (HOSPITAL_COMMUNITY)
Admission: RE | Admit: 2011-06-02 | Discharge: 2011-06-02 | Disposition: A | Discharge status: Home / Self Care | Payer: Medicare Other | Source: Ambulatory Visit | Attending: Nephrology | Admitting: Nephrology

## 2011-06-02 ENCOUNTER — Ambulatory Visit (HOSPITAL_COMMUNITY)
Admission: RE | Admit: 2011-06-02 | Discharge: 2011-06-02 | Disposition: A | Discharge status: Home / Self Care | Payer: Medicare Other | Source: Ambulatory Visit | Attending: Internal Medicine | Admitting: Internal Medicine

## 2011-06-02 VITALS — BP 115/64 | HR 84 | Wt 176.5 lb

## 2011-06-02 DIAGNOSIS — N183 Chronic kidney disease, stage 3 unspecified: Secondary | ICD-10-CM | POA: Insufficient documentation

## 2011-06-02 DIAGNOSIS — R5383 Other fatigue: Secondary | ICD-10-CM

## 2011-06-02 DIAGNOSIS — I059 Rheumatic mitral valve disease, unspecified: Secondary | ICD-10-CM | POA: Insufficient documentation

## 2011-06-02 DIAGNOSIS — D638 Anemia in other chronic diseases classified elsewhere: Secondary | ICD-10-CM | POA: Insufficient documentation

## 2011-06-02 DIAGNOSIS — I5022 Chronic systolic (congestive) heart failure: Secondary | ICD-10-CM | POA: Insufficient documentation

## 2011-06-02 DIAGNOSIS — R5381 Other malaise: Secondary | ICD-10-CM | POA: Insufficient documentation

## 2011-06-02 LAB — IRON AND TIBC
Iron: 55 ug/dL (ref 42–135)
Saturation Ratios: 24 % (ref 20–55)
TIBC: 227 ug/dL (ref 215–435)

## 2011-06-02 LAB — RENAL FUNCTION PANEL
BUN: 18 mg/dL (ref 6–23)
CO2: 24 mEq/L (ref 19–32)
Chloride: 105 mEq/L (ref 96–112)
Creatinine, Ser: 1.39 mg/dL — ABNORMAL HIGH (ref 0.50–1.35)
GFR calc non Af Amer: 51 mL/min — ABNORMAL LOW (ref >90)

## 2011-06-02 LAB — POCT HEMOGLOBIN-HEMACUE: Hemoglobin: 10.6 g/dL — ABNORMAL LOW (ref 13.0–17.0)

## 2011-06-02 MED ORDER — EPOETIN ALFA 10000 UNIT/ML IJ SOLN: 20000.0000 [IU] | INTRAMUSCULAR | Status: DC

## 2011-06-02 MED ORDER — CARVEDILOL 6.25 MG PO TABS: 6.2500 mg | ORAL_TABLET | Freq: Two times a day (BID) | ORAL | Status: DC

## 2011-06-02 MED ORDER — EPOETIN ALFA 20000 UNIT/ML IJ SOLN
INTRAMUSCULAR | Status: AC
  Administered 2011-06-02: 20000 [IU] via SUBCUTANEOUS
  Filled 2011-06-02: qty 1

## 2011-06-02 NOTE — Progress Notes (Signed)
HPI:  [name] is a 65 year old male with h/o polysubstance abuse c/b MV endocarditis s/p St. Jude MVR, CHF due to NICM 35-40% and PAF. He returns today for routine followup.  TTE in 2010 showed normal EF with elevated gradient across MV (mean 14) with perivalvular leak. Follow-up TEE in June 2010 with EF 40-45% mild stenosis mean gradient = 8. No vegetation or abscess. Most recent echo 7/12 EF 35% mild perivalvular MR.   Has not been on b-blocker due to very long 1st AVB. Recently having severe fatigue and underwent CRT-P placement in September.  Returns for routine f/u. Says he feels much better. Denies dyspnea. He has not had any lower extremity edema. He denies any palpitations. No syncope/presyncope.  He is been compliant with all his medications. He denies any smoking or drinking. Having problems with his memory at times. PCP following INRs.   ROS: All systems negative except as listed in HPI, PMH and Problem List.  Past Medical History  Diagnosis Date  . 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  a- Echo 07/2008 Ef 55% mild peroprosthetic MVR with High transmitral gradient (mean 14).  b- normal coronaries by cath 12/2007  . Atrial fibrillation     post op.  s/p dc-cv 04/2008. previously on amiodarone  . Atrial flutter     atypical  . Heavy alcohol use     history  . IV drug abuse     history of  . CHF (congestive heart failure)     EF 35-40 % 2011 due to valvular disease and diastolic dysfunction  . History of GI bleed   . Hypertension   . GERD (gastroesophageal reflux disease)   . Chronic back pain   . AV block, 1st degree     .450 msec--progressive  . Dysphagia     with normal barium swallow 09/2008    Current Outpatient Prescriptions  Medication Sig Dispense Refill  . carvedilol (COREG) 3.125 MG tablet Take 3.125 mg by mouth 2 (two) times daily with a meal.        . KLOR-CON M20 20 MEQ tablet Take 1 tablet by mouth Daily.      Marland Kitchen  lisinopril (PRINIVIL,ZESTRIL) 20 MG tablet Take 1 tablet (20 mg total) by mouth daily.  30 tablet  3  . omeprazole (PRILOSEC) 20 MG capsule Take 20 mg by mouth daily.        . traMADol (ULTRAM) 50 MG tablet Take 50 mg by mouth every 8 (eight) hours as needed.        . warfarin (COUMADIN) 5 MG tablet Take 5 mg by mouth as directed. Take as directed by anticoagulation clinic        No current facility-administered medications for this encounter.   Facility-Administered Medications Ordered in Other Encounters  Medication Dose Route Frequency Provider Last Rate Last Dose  . epoetin alfa (EPOGEN,PROCRIT) 81191 UNIT/ML injection        20,000 Units at 06/02/11 0920  . epoetin alfa (EPOGEN,PROCRIT) injection 20,000 Units  20,000 Units Subcutaneous Q28 days Irena Cords, MD         PHYSICAL EXAM: Filed Vitals:   06/02/11 1126  BP: 115/64  Pulse: 84   Gen: well appearing. no resp difficulty HEENT: normal Neck: supple. no JVD. Carotids 2+ bilat; no bruits. No  Cor: PMI nondisplaced. Mildly irregular. No rubs, gallops. mechanical s1. soft diastolic mitral murmur. Pacer site looks good. Lungs: clear Abdomen: soft,  nontender, nondistended. No hepatosplenomegaly. No bruits or masses. Good bowel sounds. Extremities: no cyanosis, clubbing, rash, edema Neuro: alert & orientedx3, cranial nerves grossly intact. moves all 4 extremities w/o difficulty. affect pleasant   ASSESSMENT & PLAN:

## 2011-06-02 NOTE — Assessment & Plan Note (Signed)
S/p MVR. Coumadin followed by PCP. No evidence of bleeding. Continue SBE prophylaxis.

## 2011-06-02 NOTE — Assessment & Plan Note (Signed)
He has had a very good response to CRT. Energy levels much improved.

## 2011-06-02 NOTE — Patient Instructions (Signed)
Increase Carvedilol to 6.25 mg Twice daily   Your physician has requested that you have an echocardiogram. Echocardiography is a painless test that uses sound waves to create images of your heart. It provides your doctor with information about the size and shape of your heart and how well your heart's chambers and valves are working. This procedure takes approximately one hour. There are no restrictions for this procedure.  In 3 months.  Your physician recommends that you schedule a follow-up appointment in: 3 months.

## 2011-06-02 NOTE — Assessment & Plan Note (Signed)
Volume status looks good. NYHA II. Will titrate carvedilol to 6.25 bid. F/u echo at next visit to see if any improvement in EF with CRT.

## 2011-06-22 ENCOUNTER — Encounter: Payer: Self-pay | Admitting: Internal Medicine

## 2011-06-22 ENCOUNTER — Ambulatory Visit (INDEPENDENT_AMBULATORY_CARE_PROVIDER_SITE_OTHER): Payer: Medicare Other | Admitting: Internal Medicine

## 2011-06-22 DIAGNOSIS — I440 Atrioventricular block, first degree: Secondary | ICD-10-CM

## 2011-06-22 DIAGNOSIS — I5022 Chronic systolic (congestive) heart failure: Secondary | ICD-10-CM

## 2011-06-22 DIAGNOSIS — Z950 Presence of cardiac pacemaker: Secondary | ICD-10-CM | POA: Insufficient documentation

## 2011-06-22 DIAGNOSIS — I429 Cardiomyopathy, unspecified: Secondary | ICD-10-CM

## 2011-06-22 HISTORY — DX: Presence of cardiac pacemaker: Z95.0

## 2011-06-22 LAB — PACEMAKER DEVICE OBSERVATION
AL AMPLITUDE: 5 mv
AL IMPEDENCE PM: 400 Ohm
ATRIAL PACING PM: 1
LV LEAD THRESHOLD: 1 V
VENTRICULAR PACING PM: 96

## 2011-06-22 NOTE — Assessment & Plan Note (Addendum)
Continue current medications. Is He a candidate for Aldactone?  Or his BP limiting

## 2011-06-22 NOTE — Assessment & Plan Note (Signed)
As above.

## 2011-06-22 NOTE — Assessment & Plan Note (Addendum)
The patient's device was interrogated.  The information was reviewed.Changes were made in the programming to maximize longevity

## 2011-06-22 NOTE — Patient Instructions (Signed)
Your physician wants you to follow-up in: 9 months with Dr. Klein. You will receive a reminder letter in the mail two months in advance. If you don't receive a letter, please call our office to schedule the follow-up appointment.  Your physician recommends that you continue on your current medications as directed. Please refer to the Current Medication list given to you today.    

## 2011-06-22 NOTE — Assessment & Plan Note (Signed)
Improved. One issue for consideration from the heart failure clinic his liver we should undertake AV optimization. With very profound first-degree AV block it is hard to estimate intra-atrial conduction times and also right since AV delay

## 2011-06-22 NOTE — Progress Notes (Signed)
  HPI  [name] is a 65 y.o. male Seen in followup for CRT-P. Implanted September 2012 for profound first degree AV block in the setting of a modest valvular cardiomyopathy ejection fraction 35-40%.    He is s/p MVR for endocarditis associated with IVDA. TTE in 2010 showed normal EF with elevated gradient across MV (mean 14) with perivalvular leak   He is better since he CRT was implanted,  less shortness of breath and less fatigue.the site is well-healed  Past Medical History  Diagnosis Date  . 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  a- Echo 07/2008 Ef 55% mild peroprosthetic MVR with High transmitral gradient (mean 14).  b- normal coronaries by cath 12/2007  . Atrial fibrillation     post op.  s/p dc-cv 04/2008. previously on amiodarone  . Atrial flutter     atypical  . Heavy alcohol use     history  . IV drug abuse     history of  . CHF (congestive heart failure)     EF 35-40 % 2011 due to valvular disease and diastolic dysfunction  . History of GI bleed   . Hypertension   . GERD (gastroesophageal reflux disease)   . Chronic back pain   . AV block, 1st degree     .450 msec--progressive  . Dysphagia     with normal barium swallow 09/2008    No past surgical history on file.  Current Outpatient Prescriptions  Medication Sig Dispense Refill  . carvedilol (COREG) 6.25 MG tablet Take 1 tablet (6.25 mg total) by mouth 2 (two) times daily with a meal.  60 tablet  6  . KLOR-CON M20 20 MEQ tablet Take 1 tablet by mouth Daily.      Marland Kitchen lisinopril (PRINIVIL,ZESTRIL) 20 MG tablet Take 1 tablet (20 mg total) by mouth daily.  30 tablet  3  . omeprazole (PRILOSEC) 20 MG capsule Take 20 mg by mouth daily.        . traMADol (ULTRAM) 50 MG tablet Take 50 mg by mouth every 8 (eight) hours as needed.        . warfarin (COUMADIN) 5 MG tablet Take 5 mg by mouth as directed. Take as directed by anticoagulation clinic         No Known  Allergies  Review of Systems negative except from HPI and PMH  Physical Exam Well developed and well nourished in no acute distress HENT normal E scleral and icterus clear Neck Supple JVP flat; carotids brisk and full Clear to ausculation Regular rate and rhythm; there is a mechanical S1 and an early systolic murmur Soft with active bowel sounds No clubbing cyanosis none Edema Alert and oriented, grossly normal motor and sensory function Skin Warm and Dry   Assessment and  Plan

## 2011-07-01 ENCOUNTER — Encounter (HOSPITAL_COMMUNITY)
Admission: RE | Admit: 2011-07-01 | Discharge: 2011-07-01 | Disposition: A | Discharge status: Home / Self Care | Payer: Medicare Other | Source: Ambulatory Visit | Attending: Nephrology | Admitting: Nephrology

## 2011-07-01 DIAGNOSIS — N183 Chronic kidney disease, stage 3 unspecified: Secondary | ICD-10-CM | POA: Insufficient documentation

## 2011-07-01 DIAGNOSIS — D638 Anemia in other chronic diseases classified elsewhere: Secondary | ICD-10-CM | POA: Insufficient documentation

## 2011-07-01 LAB — RENAL FUNCTION PANEL
BUN: 17 mg/dL (ref 6–23)
CO2: 26 mEq/L (ref 19–32)
Calcium: 9.2 mg/dL (ref 8.4–10.5)
GFR calc Af Amer: 58 mL/min — ABNORMAL LOW (ref >90)
Glucose, Bld: 82 mg/dL (ref 70–99)
Phosphorus: 2.3 mg/dL (ref 2.3–4.6)

## 2011-07-01 LAB — IRON AND TIBC
Saturation Ratios: 25 % (ref 20–55)
UIBC: 170 ug/dL (ref 125–400)

## 2011-07-01 LAB — FERRITIN: Ferritin: 444 ng/mL — ABNORMAL HIGH (ref 22–322)

## 2011-07-01 MED ORDER — EPOETIN ALFA 20000 UNIT/ML IJ SOLN
INTRAMUSCULAR | Status: AC
  Administered 2011-07-01: 20000 [IU] via SUBCUTANEOUS
  Filled 2011-07-01: qty 1

## 2011-07-01 MED ORDER — EPOETIN ALFA 10000 UNIT/ML IJ SOLN: 20000.0000 [IU] | INTRAMUSCULAR | Status: DC

## 2011-07-29 ENCOUNTER — Encounter (HOSPITAL_COMMUNITY)
Admission: RE | Admit: 2011-07-29 | Discharge: 2011-07-29 | Disposition: A | Discharge status: Home / Self Care | Payer: Medicare Other | Source: Ambulatory Visit | Attending: Nephrology | Admitting: Nephrology

## 2011-07-29 DIAGNOSIS — N183 Chronic kidney disease, stage 3 unspecified: Secondary | ICD-10-CM | POA: Insufficient documentation

## 2011-07-29 DIAGNOSIS — D638 Anemia in other chronic diseases classified elsewhere: Secondary | ICD-10-CM | POA: Insufficient documentation

## 2011-07-29 LAB — POCT HEMOGLOBIN-HEMACUE: Hemoglobin: 11 g/dL — ABNORMAL LOW (ref 13.0–17.0)

## 2011-07-29 MED ORDER — EPOETIN ALFA 10000 UNIT/ML IJ SOLN: 20000.0000 [IU] | INTRAMUSCULAR | Status: DC

## 2011-07-29 MED ORDER — EPOETIN ALFA 20000 UNIT/ML IJ SOLN
INTRAMUSCULAR | Status: AC
  Administered 2011-07-29: 20000 [IU] via SUBCUTANEOUS
  Filled 2011-07-29: qty 1

## 2011-08-25 ENCOUNTER — Other Ambulatory Visit (HOSPITAL_COMMUNITY): Payer: Self-pay | Admitting: *Deleted

## 2011-08-26 ENCOUNTER — Encounter (HOSPITAL_COMMUNITY)
Admission: RE | Admit: 2011-08-26 | Discharge: 2011-08-26 | Disposition: A | Discharge status: Home / Self Care | Payer: Medicare Other | Source: Ambulatory Visit | Attending: Nephrology | Admitting: Nephrology

## 2011-08-26 DIAGNOSIS — N183 Chronic kidney disease, stage 3 unspecified: Secondary | ICD-10-CM | POA: Insufficient documentation

## 2011-08-26 DIAGNOSIS — D638 Anemia in other chronic diseases classified elsewhere: Secondary | ICD-10-CM | POA: Insufficient documentation

## 2011-08-26 LAB — RENAL FUNCTION PANEL
CO2: 25 mEq/L (ref 19–32)
Calcium: 9.2 mg/dL (ref 8.4–10.5)
Creatinine, Ser: 1.39 mg/dL — ABNORMAL HIGH (ref 0.50–1.35)
GFR calc Af Amer: 59 mL/min — ABNORMAL LOW (ref >90)
GFR calc non Af Amer: 51 mL/min — ABNORMAL LOW (ref >90)
Glucose, Bld: 103 mg/dL — ABNORMAL HIGH (ref 70–99)
Phosphorus: 2.5 mg/dL (ref 2.3–4.6)
Sodium: 139 mEq/L (ref 135–145)

## 2011-08-26 LAB — IRON AND TIBC
Saturation Ratios: 15 % — ABNORMAL LOW (ref 20–55)
UIBC: 215 ug/dL (ref 125–400)

## 2011-08-26 LAB — POCT HEMOGLOBIN-HEMACUE: Hemoglobin: 10.4 g/dL — ABNORMAL LOW (ref 13.0–17.0)

## 2011-08-26 MED ORDER — EPOETIN ALFA 10000 UNIT/ML IJ SOLN: 20000.0000 [IU] | INTRAMUSCULAR | Status: DC

## 2011-08-26 MED ORDER — EPOETIN ALFA 20000 UNIT/ML IJ SOLN
INTRAMUSCULAR | Status: AC
  Administered 2011-08-26: 20000 [IU] via SUBCUTANEOUS
  Filled 2011-08-26: qty 1

## 2011-08-31 ENCOUNTER — Ambulatory Visit (HOSPITAL_COMMUNITY)
Admission: RE | Admit: 2011-08-31 | Discharge: 2011-08-31 | Disposition: A | Discharge status: Home / Self Care | Payer: Medicare Other | Source: Ambulatory Visit | Attending: Internal Medicine | Admitting: Internal Medicine

## 2011-08-31 VITALS — BP 110/58 | HR 88 | Wt 181.8 lb

## 2011-08-31 DIAGNOSIS — I059 Rheumatic mitral valve disease, unspecified: Secondary | ICD-10-CM | POA: Insufficient documentation

## 2011-08-31 DIAGNOSIS — I5022 Chronic systolic (congestive) heart failure: Secondary | ICD-10-CM | POA: Insufficient documentation

## 2011-08-31 MED ORDER — FUROSEMIDE 20 MG PO TABS: 20.0000 mg | ORAL_TABLET | ORAL | Status: DC | PRN

## 2011-08-31 MED ORDER — CARVEDILOL 6.25 MG PO TABS: 9.3750 mg | ORAL_TABLET | Freq: Two times a day (BID) | ORAL | Status: DC

## 2011-08-31 NOTE — Patient Instructions (Addendum)
Take Carvedilol 9.375 mg twice daily  Take Lasix 20 mg  as needed for swelling  Your physician has requested that you have an echocardiogram. Echocardiography is a painless test that uses sound waves to create images of your heart. It provides your doctor with information about the size and shape of your heart and how well your heart's chambers and valves are working. This procedure takes approximately one hour. There are no restrictions for this procedure.  Follow up in 1 month  ,Do the following things EVERYDAY: 1) Weigh yourself in the morning before breakfast. Write it down and keep it in a log. 2) Take your medicines as prescribed 3) Eat low salt foods--Limit salt (sodium) to 2000mg  per day.  Stay as active as you can everyday

## 2011-08-31 NOTE — Progress Notes (Signed)
Patient ID: [name], male   DOB: 11-Sep-1945, 66 y.o.   MRN: [id number]  HPI:  [name] is a 66 year old male with h/o polysubstance abuse c/b MV endocarditis s/p St. Jude MVR, CHF due to NICM 35-40% and PAF.   TTE in 2010 showed normal EF with elevated gradient across MV (mean 14) with perivalvular leak. Follow-up TEE in June 2010 with EF 40-45% mild stenosis mean gradient = 8. No vegetation or abscess.   Most recent echo 8/12 EF 35% mild perivalvular MR.   Has not been on b-blocker due to very long 1st AVB. Recently having severe fatigue and underwent CRT-P placement in September.  Returns for routine f/u. Denies SOB/PND/Orthopnea. Last visit Carvedilol increased to 6.25 mg bid and he tolerated this well.  Ambulates or rides his bike 2 miles 3 times a week. Weight at home 178-180. Denies dyspnea. He has not had any lower extremity edema.  He is able to obtain medications. He has missed carvedilol and Coumadin about 3 days.  He denies any smoking or drinking. Having problems with his memory at times.  [facility] following.   ROS: All systems negative except as listed in HPI, PMH and Problem List.  Past Medical History  Diagnosis Date  . 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  a- Echo 07/2008 Ef 55% mild peroprosthetic MVR with High transmitral gradient (mean 14).  b- normal coronaries by cath 12/2007  . Atrial fibrillation     post op.  s/p dc-cv 04/2008. previously on amiodarone  . Atrial flutter     atypical  . Heavy alcohol use     history  . IV drug abuse     history of  . CHF (congestive heart failure)     EF 35-40 % 2011 due to valvular disease and diastolic dysfunction  . History of GI bleed   . Hypertension   . GERD (gastroesophageal reflux disease)   . Chronic back pain   . AV block, 1st degree     .450 msec--progressive  . Dysphagia     with normal barium swallow 09/2008    Current Outpatient Prescriptions  Medication  Sig Dispense Refill  . carvedilol (COREG) 6.25 MG tablet Take 1.5 tablets (9.375 mg total) by mouth 2 (two) times daily with a meal.  90 tablet  6  . KLOR-CON M20 20 MEQ tablet Take 1 tablet by mouth Daily.      Marland Kitchen lisinopril (PRINIVIL,ZESTRIL) 20 MG tablet Take 1 tablet (20 mg total) by mouth daily.  30 tablet  3  . omeprazole (PRILOSEC) 20 MG capsule Take 20 mg by mouth daily.        . traMADol (ULTRAM) 50 MG tablet Take 50 mg by mouth every 8 (eight) hours as needed.        . warfarin (COUMADIN) 5 MG tablet Take 5 mg by mouth as directed. Take as directed by anticoagulation clinic       . furosemide (LASIX) 20 MG tablet Take 1 tablet (20 mg total) by mouth as needed.  30 tablet  6     PHYSICAL EXAM: Filed Vitals:   08/31/11 0855  BP: 110/58  Pulse: 88   Gen: well appearing. no resp difficulty HEENT: normal Neck: supple. no JVD. Carotids 2+ bilat; no bruits. No  Cor: PMI nondisplaced. Regular. No rubs, gallops. mechanical s1. soft diastolic mitral murmur. Pacer site looks good. Lungs: clear Abdomen: soft, nontender, nondistended. No  hepatosplenomegaly. No bruits or masses. Good bowel sounds. Extremities: no cyanosis, clubbing, rash, edema Neuro: alert & orientedx3, cranial nerves grossly intact. moves all 4 extremities w/o difficulty. affect pleasant   ASSESSMENT & PLAN:

## 2011-08-31 NOTE — Assessment & Plan Note (Addendum)
Doing very well. NYHA I-II. Volume status stable. Will increase Carvedilol 9.375 mg twice a day. Instructed to take Lasix 20 mg daily  as needed for lower extremity edema.  Repeat ECHO this Friday. Follow up in 1 month.

## 2011-09-03 ENCOUNTER — Other Ambulatory Visit: Payer: Self-pay | Admitting: Internal Medicine

## 2011-09-08 ENCOUNTER — Other Ambulatory Visit (HOSPITAL_COMMUNITY): Payer: Self-pay | Admitting: *Deleted

## 2011-09-09 ENCOUNTER — Encounter (HOSPITAL_COMMUNITY)
Admission: RE | Admit: 2011-09-09 | Discharge: 2011-09-09 | Disposition: A | Discharge status: Home / Self Care | Payer: Medicare Other | Source: Ambulatory Visit | Attending: Nephrology | Admitting: Nephrology

## 2011-09-09 DIAGNOSIS — N183 Chronic kidney disease, stage 3 unspecified: Secondary | ICD-10-CM | POA: Insufficient documentation

## 2011-09-09 DIAGNOSIS — D638 Anemia in other chronic diseases classified elsewhere: Secondary | ICD-10-CM | POA: Insufficient documentation

## 2011-09-09 MED ORDER — SODIUM CHLORIDE 0.9 % IV SOLN
INTRAVENOUS | Status: AC
  Administered 2011-09-09: 13:00:00 via INTRAVENOUS

## 2011-09-09 MED ORDER — FERUMOXYTOL INJECTION 510 MG/17 ML
INTRAVENOUS | Status: AC
  Administered 2011-09-09: 510 mg via INTRAVENOUS
  Filled 2011-09-09: qty 17

## 2011-09-09 MED ORDER — FERUMOXYTOL INJECTION 510 MG/17 ML
510.0000 mg | INTRAVENOUS | Status: DC
  Administered 2011-09-09: 510 mg via INTRAVENOUS

## 2011-09-16 ENCOUNTER — Encounter (HOSPITAL_COMMUNITY)
Admission: RE | Admit: 2011-09-16 | Discharge: 2011-09-16 | Disposition: A | Discharge status: Home / Self Care | Payer: Medicare Other | Source: Ambulatory Visit | Attending: Nephrology | Admitting: Nephrology

## 2011-09-16 LAB — POCT HEMOGLOBIN-HEMACUE: Hemoglobin: 10.8 g/dL — ABNORMAL LOW (ref 13.0–17.0)

## 2011-09-16 MED ORDER — EPOETIN ALFA 20000 UNIT/ML IJ SOLN
INTRAMUSCULAR | Status: AC
  Administered 2011-09-16: 20000 [IU] via SUBCUTANEOUS
  Filled 2011-09-16: qty 1

## 2011-09-16 MED ORDER — EPOETIN ALFA 10000 UNIT/ML IJ SOLN: 20000.0000 [IU] | INTRAMUSCULAR | Status: DC

## 2011-09-16 MED ORDER — SODIUM CHLORIDE 0.9 % IV SOLN
INTRAVENOUS | Status: AC
  Administered 2011-09-16: 09:00:00 via INTRAVENOUS

## 2011-09-16 MED ORDER — FERUMOXYTOL INJECTION 510 MG/17 ML
510.0000 mg | INTRAVENOUS | Status: AC
  Administered 2011-09-16: 510 mg via INTRAVENOUS
  Filled 2011-09-16: qty 17

## 2011-09-23 ENCOUNTER — Ambulatory Visit (HOSPITAL_COMMUNITY)
Admission: RE | Admit: 2011-09-23 | Discharge: 2011-09-23 | Disposition: A | Discharge status: Home / Self Care | Payer: Medicare Other | Source: Ambulatory Visit | Attending: Internal Medicine | Admitting: Internal Medicine

## 2011-09-23 DIAGNOSIS — R0989 Other specified symptoms and signs involving the circulatory and respiratory systems: Secondary | ICD-10-CM | POA: Insufficient documentation

## 2011-09-23 DIAGNOSIS — I509 Heart failure, unspecified: Secondary | ICD-10-CM | POA: Insufficient documentation

## 2011-09-23 DIAGNOSIS — I5022 Chronic systolic (congestive) heart failure: Secondary | ICD-10-CM

## 2011-09-23 DIAGNOSIS — R0609 Other forms of dyspnea: Secondary | ICD-10-CM | POA: Insufficient documentation

## 2011-09-23 DIAGNOSIS — I10 Essential (primary) hypertension: Secondary | ICD-10-CM | POA: Insufficient documentation

## 2011-09-23 DIAGNOSIS — I059 Rheumatic mitral valve disease, unspecified: Secondary | ICD-10-CM

## 2011-09-23 DIAGNOSIS — E785 Hyperlipidemia, unspecified: Secondary | ICD-10-CM | POA: Insufficient documentation

## 2011-09-23 DIAGNOSIS — R011 Cardiac murmur, unspecified: Secondary | ICD-10-CM | POA: Insufficient documentation

## 2011-09-23 NOTE — Progress Notes (Signed)
  Echocardiogram 2D Echocardiogram has been performed.  [name] 09/23/2011, 9:40 AM

## 2011-09-28 ENCOUNTER — Ambulatory Visit (HOSPITAL_COMMUNITY)
Admission: RE | Admit: 2011-09-28 | Discharge: 2011-09-28 | Disposition: A | Discharge status: Home / Self Care | Payer: Medicare Other | Source: Ambulatory Visit | Attending: Internal Medicine | Admitting: Internal Medicine

## 2011-09-28 VITALS — BP 104/52 | HR 94 | Wt 183.0 lb

## 2011-09-28 DIAGNOSIS — I5022 Chronic systolic (congestive) heart failure: Secondary | ICD-10-CM

## 2011-09-28 DIAGNOSIS — I059 Rheumatic mitral valve disease, unspecified: Secondary | ICD-10-CM

## 2011-09-28 NOTE — Progress Notes (Signed)
Encounter addended by: Noralee Space, RN on: 09/28/2011 10:18 AM<BR>     Documentation filed: Patient Instructions Section

## 2011-09-28 NOTE — Assessment & Plan Note (Signed)
Doing very well. EF recovered. NYHA I-II. Euvolemic. Continue current regimen as BP too low to titrate further.

## 2011-09-28 NOTE — Assessment & Plan Note (Signed)
S/p MVR. Mild peri-valvular leak on echo which is stable. Continue coumadin through [facility]. Is edentulous so does not go to dentist so SBE prophylaxis no idicated there. Did discuss need for abx prior to any invasive procedures.

## 2011-09-28 NOTE — Progress Notes (Signed)
Patient ID: BARACK NICODEMUS, male   DOB: Jul 10, 1945, 66 y.o.   MRN: [id number]  HPI:  [name] is a 66 year old male with h/o polysubstance abuse c/b MV endocarditis s/p St. Jude MVR, CHF due to NICM 35-40% and PAF.  Underwent CRT-P implant for bradycardia and fatigue.   TTE in 2010 showed normal EF with elevated gradient across MV (mean 14) with perivalvular leak. Follow-up TEE in June 2010 with EF 40-45% mild stenosis mean gradient = 8. No vegetation or abscess.   Echo 8/12 EF 35% mild perivalvular MR.   We saw him last month. Doing very well. NYHA I-II. Volume status stable. Carvedilol increased to 9.375 mg twice a day. Instructed to take Lasix 20 mg daily as needed for lower extremity edema. Had f/u Echo last week (which we reviewed together) EF 50% mild MVR stable with mild peri-valvular leak.   Returns for routine f/u. Denies SOB/PND/Orthopnea. Says he can walk a long way without getting tired. [facility] following coumadin. No bleeding. Memory still bothering him.  ROS: All systems negative except as listed in HPI, PMH and Problem List.  Past Medical History  Diagnosis Date  . 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  a- Echo 07/2008 Ef 55% mild peroprosthetic MVR with High transmitral gradient (mean 14).  b- normal coronaries by cath 12/2007  . Atrial fibrillation     post op.  s/p dc-cv 04/2008. previously on amiodarone  . Atrial flutter     atypical  . Heavy alcohol use     history  . IV drug abuse     history of  . CHF (congestive heart failure)     EF 35-40 % 2011 due to valvular disease and diastolic dysfunction  . History of GI bleed   . Hypertension   . GERD (gastroesophageal reflux disease)   . Chronic back pain   . AV block, 1st degree     .450 msec--progressive  . Dysphagia     with normal barium swallow 09/2008    Current Outpatient Prescriptions  Medication Sig Dispense Refill  . carvedilol (COREG) 6.25 MG tablet Take  1.5 tablets (9.375 mg total) by mouth 2 (two) times daily with a meal.  90 tablet  6  . furosemide (LASIX) 20 MG tablet Take 1 tablet (20 mg total) by mouth as needed.  30 tablet  6  . KLOR-CON M20 20 MEQ tablet Take 1 tablet by mouth Daily.      Marland Kitchen lisinopril (PRINIVIL,ZESTRIL) 20 MG tablet TAKE 1 TABLET (20 MG TOTAL) BY MOUTH DAILY.  30 tablet  3  . omeprazole (PRILOSEC) 20 MG capsule Take 20 mg by mouth daily.        . ranitidine (ZANTAC) 150 MG tablet Take 150 mg by mouth 2 (two) times daily as needed.      . traMADol (ULTRAM) 50 MG tablet Take 50 mg by mouth every 8 (eight) hours as needed.        . warfarin (COUMADIN) 5 MG tablet Take 5 mg by mouth as directed. Take as directed by anticoagulation clinic          PHYSICAL EXAM: Filed Vitals:   09/28/11 0911  BP: 104/52  Pulse: 94   Gen: well appearing. no resp difficulty HEENT: normal Neck: supple. no JVD. Carotids 2+ bilat; no bruits. No  Cor: PMI nondisplaced. Regular. No rubs, gallops. mechanical s1. soft diastolic mitral murmur. Pacer site looks good. Lungs: clear  Abdomen: soft, nontender, nondistended. No hepatosplenomegaly. No bruits or masses. Good bowel sounds. Extremities: no cyanosis, clubbing, rash, edema Neuro: alert & orientedx3, cranial nerves grossly intact. moves all 4 extremities w/o difficulty. affect pleasant   ASSESSMENT & PLAN:

## 2011-09-28 NOTE — Patient Instructions (Signed)
We will contact you in 6 months to schedule your next appointment.  

## 2011-10-14 ENCOUNTER — Encounter (HOSPITAL_COMMUNITY)
Admission: RE | Admit: 2011-10-14 | Discharge: 2011-10-14 | Disposition: A | Discharge status: Home / Self Care | Payer: Medicare Other | Source: Ambulatory Visit | Attending: Nephrology | Admitting: Nephrology

## 2011-10-14 DIAGNOSIS — D638 Anemia in other chronic diseases classified elsewhere: Secondary | ICD-10-CM | POA: Insufficient documentation

## 2011-10-14 DIAGNOSIS — N183 Chronic kidney disease, stage 3 unspecified: Secondary | ICD-10-CM | POA: Insufficient documentation

## 2011-10-14 LAB — RENAL FUNCTION PANEL
Albumin: 4 g/dL (ref 3.5–5.2)
BUN: 20 mg/dL (ref 6–23)
Chloride: 108 mEq/L (ref 96–112)
Creatinine, Ser: 1.64 mg/dL — ABNORMAL HIGH (ref 0.50–1.35)
GFR calc non Af Amer: 42 mL/min — ABNORMAL LOW (ref >90)
Phosphorus: 2.9 mg/dL (ref 2.3–4.6)
Potassium: 3.6 mEq/L (ref 3.5–5.1)

## 2011-10-14 LAB — POCT HEMOGLOBIN-HEMACUE: Hemoglobin: 10.8 g/dL — ABNORMAL LOW (ref 13.0–17.0)

## 2011-10-14 LAB — FERRITIN: Ferritin: 688 ng/mL — ABNORMAL HIGH (ref 22–322)

## 2011-10-14 LAB — IRON AND TIBC: TIBC: 211 ug/dL — ABNORMAL LOW (ref 215–435)

## 2011-10-14 MED ORDER — EPOETIN ALFA 10000 UNIT/ML IJ SOLN: 20000.0000 [IU] | INTRAMUSCULAR | Status: DC

## 2011-10-14 MED ORDER — EPOETIN ALFA 20000 UNIT/ML IJ SOLN
INTRAMUSCULAR | Status: AC
  Administered 2011-10-14: 20000 [IU] via SUBCUTANEOUS
  Filled 2011-10-14: qty 1

## 2011-11-09 ENCOUNTER — Other Ambulatory Visit (HOSPITAL_COMMUNITY): Payer: Self-pay | Admitting: *Deleted

## 2011-11-11 ENCOUNTER — Encounter (HOSPITAL_COMMUNITY)
Admission: RE | Admit: 2011-11-11 | Discharge: 2011-11-11 | Disposition: A | Discharge status: Home / Self Care | Payer: Medicare Other | Source: Ambulatory Visit | Attending: Nephrology | Admitting: Nephrology

## 2011-11-11 DIAGNOSIS — D638 Anemia in other chronic diseases classified elsewhere: Secondary | ICD-10-CM | POA: Insufficient documentation

## 2011-11-11 DIAGNOSIS — N183 Chronic kidney disease, stage 3 unspecified: Secondary | ICD-10-CM | POA: Insufficient documentation

## 2011-11-11 MED ORDER — EPOETIN ALFA 10000 UNIT/ML IJ SOLN: 20000.0000 [IU] | INTRAMUSCULAR | Status: DC

## 2011-11-11 MED ORDER — EPOETIN ALFA 20000 UNIT/ML IJ SOLN
INTRAMUSCULAR | Status: AC
  Administered 2011-11-11: 20000 [IU] via SUBCUTANEOUS
  Filled 2011-11-11: qty 1

## 2011-12-09 ENCOUNTER — Encounter (HOSPITAL_COMMUNITY)
Admission: RE | Admit: 2011-12-09 | Discharge: 2011-12-09 | Disposition: A | Discharge status: Home / Self Care | Payer: Medicare Other | Source: Ambulatory Visit | Attending: Nephrology | Admitting: Nephrology

## 2011-12-09 DIAGNOSIS — N183 Chronic kidney disease, stage 3 unspecified: Secondary | ICD-10-CM | POA: Insufficient documentation

## 2011-12-09 DIAGNOSIS — D638 Anemia in other chronic diseases classified elsewhere: Secondary | ICD-10-CM | POA: Insufficient documentation

## 2011-12-09 LAB — IRON AND TIBC
Iron: 68 ug/dL (ref 42–135)
TIBC: 225 ug/dL (ref 215–435)
UIBC: 157 ug/dL (ref 125–400)

## 2011-12-09 LAB — FERRITIN: Ferritin: 493 ng/mL — ABNORMAL HIGH (ref 22–322)

## 2011-12-09 MED ORDER — EPOETIN ALFA 20000 UNIT/ML IJ SOLN
INTRAMUSCULAR | Status: AC
  Administered 2011-12-09: 20000 [IU]
  Filled 2011-12-09: qty 1

## 2011-12-09 MED ORDER — EPOETIN ALFA 10000 UNIT/ML IJ SOLN: 20000.0000 [IU] | INTRAMUSCULAR | Status: DC

## 2011-12-16 ENCOUNTER — Other Ambulatory Visit (HOSPITAL_COMMUNITY): Payer: Self-pay | Admitting: *Deleted

## 2011-12-16 MED ORDER — LISINOPRIL 20 MG PO TABS: 20.0000 mg | ORAL_TABLET | Freq: Every day | ORAL | Status: DC

## 2012-01-06 ENCOUNTER — Encounter (HOSPITAL_COMMUNITY)
Admission: RE | Admit: 2012-01-06 | Discharge: 2012-01-06 | Disposition: A | Discharge status: Home / Self Care | Payer: Medicare Other | Source: Ambulatory Visit | Attending: Nephrology | Admitting: Nephrology

## 2012-01-06 DIAGNOSIS — D638 Anemia in other chronic diseases classified elsewhere: Secondary | ICD-10-CM | POA: Insufficient documentation

## 2012-01-06 DIAGNOSIS — N183 Chronic kidney disease, stage 3 unspecified: Secondary | ICD-10-CM | POA: Insufficient documentation

## 2012-01-06 LAB — POCT HEMOGLOBIN-HEMACUE: Hemoglobin: 11 g/dL — ABNORMAL LOW (ref 13.0–17.0)

## 2012-01-06 MED ORDER — EPOETIN ALFA 20000 UNIT/ML IJ SOLN
INTRAMUSCULAR | Status: AC
  Administered 2012-01-06: 20000 [IU] via SUBCUTANEOUS
  Filled 2012-01-06: qty 1

## 2012-01-06 MED ORDER — EPOETIN ALFA 10000 UNIT/ML IJ SOLN: 20000.0000 [IU] | INTRAMUSCULAR | Status: DC

## 2012-02-02 ENCOUNTER — Other Ambulatory Visit (HOSPITAL_COMMUNITY): Payer: Self-pay | Admitting: *Deleted

## 2012-02-03 ENCOUNTER — Encounter (HOSPITAL_COMMUNITY)
Admission: RE | Admit: 2012-02-03 | Discharge: 2012-02-03 | Disposition: A | Discharge status: Home / Self Care | Payer: Medicare Other | Source: Ambulatory Visit | Attending: Nephrology | Admitting: Nephrology

## 2012-02-03 LAB — RENAL FUNCTION PANEL
BUN: 19 mg/dL (ref 6–23)
CO2: 22 mEq/L (ref 19–32)
GFR calc Af Amer: 50 mL/min — ABNORMAL LOW (ref >90)
Glucose, Bld: 112 mg/dL — ABNORMAL HIGH (ref 70–99)
Potassium: 3.9 mEq/L (ref 3.5–5.1)
Sodium: 140 mEq/L (ref 135–145)

## 2012-02-03 LAB — IRON AND TIBC
Iron: 47 ug/dL (ref 42–135)
Saturation Ratios: 22 % (ref 20–55)
UIBC: 169 ug/dL (ref 125–400)

## 2012-02-03 LAB — FERRITIN: Ferritin: 601 ng/mL — ABNORMAL HIGH (ref 22–322)

## 2012-02-03 LAB — POCT HEMOGLOBIN-HEMACUE: Hemoglobin: 11 g/dL — ABNORMAL LOW (ref 13.0–17.0)

## 2012-02-03 MED ORDER — EPOETIN ALFA 20000 UNIT/ML IJ SOLN
INTRAMUSCULAR | Status: AC
  Administered 2012-02-03: 20000 [IU] via SUBCUTANEOUS
  Filled 2012-02-03: qty 1

## 2012-02-03 MED ORDER — EPOETIN ALFA 10000 UNIT/ML IJ SOLN: 20000.0000 [IU] | INTRAMUSCULAR | Status: DC

## 2012-02-22 ENCOUNTER — Other Ambulatory Visit (HOSPITAL_COMMUNITY): Payer: Self-pay | Admitting: Cardiology

## 2012-02-22 DIAGNOSIS — I10 Essential (primary) hypertension: Secondary | ICD-10-CM

## 2012-02-22 MED ORDER — CARVEDILOL 6.25 MG PO TABS: 9.3750 mg | ORAL_TABLET | Freq: Two times a day (BID) | ORAL | Status: DC

## 2012-03-01 ENCOUNTER — Ambulatory Visit (INDEPENDENT_AMBULATORY_CARE_PROVIDER_SITE_OTHER): Payer: Medicare Other | Admitting: *Deleted

## 2012-03-01 DIAGNOSIS — Z7901 Long term (current) use of anticoagulants: Secondary | ICD-10-CM

## 2012-03-01 DIAGNOSIS — I499 Cardiac arrhythmia, unspecified: Secondary | ICD-10-CM

## 2012-03-01 DIAGNOSIS — I4892 Unspecified atrial flutter: Secondary | ICD-10-CM

## 2012-03-01 NOTE — Patient Instructions (Addendum)
New to coumadin clinic here from [facility], not new to coumadin, has been on warfarin since 2009.  A full discussion of the nature of anticoagulants has been carried out.  A benefit risk analysis has been presented to the patient, so that they understand the justification for choosing anticoagulation at this time. The need for frequent and regular monitoring, precise dosage adjustment and compliance is stressed.  Side effects of potential bleeding are discussed.  The patient should avoid any OTC items containing aspirin or ibuprofen, and should avoid great swings in general diet.  Avoid alcohol consumption.  Call if any signs of abnormal bleeding.

## 2012-03-02 ENCOUNTER — Encounter (HOSPITAL_COMMUNITY)
Admission: RE | Admit: 2012-03-02 | Discharge: 2012-03-02 | Disposition: A | Discharge status: Home / Self Care | Payer: Medicare Other | Source: Ambulatory Visit | Attending: Nephrology | Admitting: Nephrology

## 2012-03-02 DIAGNOSIS — D638 Anemia in other chronic diseases classified elsewhere: Secondary | ICD-10-CM | POA: Insufficient documentation

## 2012-03-02 DIAGNOSIS — N183 Chronic kidney disease, stage 3 unspecified: Secondary | ICD-10-CM | POA: Insufficient documentation

## 2012-03-02 MED ORDER — EPOETIN ALFA 10000 UNIT/ML IJ SOLN: 20000.0000 [IU] | INTRAMUSCULAR | Status: DC

## 2012-03-16 ENCOUNTER — Encounter (HOSPITAL_COMMUNITY)
Admission: RE | Admit: 2012-03-16 | Discharge: 2012-03-16 | Disposition: A | Discharge status: Home / Self Care | Payer: Medicare Other | Source: Ambulatory Visit | Attending: Nephrology | Admitting: Nephrology

## 2012-03-16 DIAGNOSIS — D638 Anemia in other chronic diseases classified elsewhere: Secondary | ICD-10-CM | POA: Insufficient documentation

## 2012-03-16 DIAGNOSIS — N183 Chronic kidney disease, stage 3 unspecified: Secondary | ICD-10-CM | POA: Insufficient documentation

## 2012-03-16 MED ORDER — EPOETIN ALFA 10000 UNIT/ML IJ SOLN: 20000.0000 [IU] | INTRAMUSCULAR | Status: DC

## 2012-03-16 MED ORDER — EPOETIN ALFA 20000 UNIT/ML IJ SOLN
INTRAMUSCULAR | Status: AC
  Administered 2012-03-16: 20000 [IU]
  Filled 2012-03-16: qty 1

## 2012-03-29 ENCOUNTER — Ambulatory Visit (INDEPENDENT_AMBULATORY_CARE_PROVIDER_SITE_OTHER): Payer: Medicare Other | Admitting: *Deleted

## 2012-03-29 DIAGNOSIS — I499 Cardiac arrhythmia, unspecified: Secondary | ICD-10-CM

## 2012-03-29 DIAGNOSIS — I4892 Unspecified atrial flutter: Secondary | ICD-10-CM

## 2012-03-29 DIAGNOSIS — Z7901 Long term (current) use of anticoagulants: Secondary | ICD-10-CM

## 2012-04-05 ENCOUNTER — Encounter: Payer: Self-pay | Admitting: Internal Medicine

## 2012-04-05 ENCOUNTER — Ambulatory Visit (INDEPENDENT_AMBULATORY_CARE_PROVIDER_SITE_OTHER): Payer: Medicare Other | Admitting: Internal Medicine

## 2012-04-05 VITALS — BP 119/64 | HR 82 | Ht 68.0 in | Wt 180.0 lb

## 2012-04-05 DIAGNOSIS — I10 Essential (primary) hypertension: Secondary | ICD-10-CM

## 2012-04-05 DIAGNOSIS — I429 Cardiomyopathy, unspecified: Secondary | ICD-10-CM

## 2012-04-05 DIAGNOSIS — Z950 Presence of cardiac pacemaker: Secondary | ICD-10-CM

## 2012-04-05 DIAGNOSIS — I440 Atrioventricular block, first degree: Secondary | ICD-10-CM

## 2012-04-05 DIAGNOSIS — I5022 Chronic systolic (congestive) heart failure: Secondary | ICD-10-CM

## 2012-04-05 LAB — BASIC METABOLIC PANEL
Calcium: 8.8 mg/dL (ref 8.4–10.5)
GFR: 64.22 mL/min (ref >60.00)
Glucose, Bld: 84 mg/dL (ref 70–99)
Potassium: 4.3 mEq/L (ref 3.5–5.1)
Sodium: 137 mEq/L (ref 135–145)

## 2012-04-05 LAB — PACEMAKER DEVICE OBSERVATION
AL THRESHOLD: 0.75 V
ATRIAL PACING PM: 1
DEVICE MODEL PM: 2665541
RV LEAD AMPLITUDE: 12 mv
RV LEAD IMPEDENCE PM: 430 Ohm
RV LEAD THRESHOLD: 0.625 V
VENTRICULAR PACING PM: 95

## 2012-04-05 LAB — MAGNESIUM: Magnesium: 1.7 mg/dL (ref 1.5–2.5)

## 2012-04-05 NOTE — Assessment & Plan Note (Signed)
Currently well controlled. Hopefully we'll be able to add spironolactone

## 2012-04-05 NOTE — Assessment & Plan Note (Signed)
He is a candidate for spironolactone therapy. We'll do one thing at a time and assess his potassium and renal function

## 2012-04-05 NOTE — Assessment & Plan Note (Signed)
The patient's device was interrogated.  The information was reviewed. No changes were made in the programming.    

## 2012-04-05 NOTE — Progress Notes (Signed)
Patient Care Team: Maurice March, MD as PCP - General (Family Medicine)   HPI  [name] is a 66 y.o. male Seen in followup for CRT-P. Implanted September 2012 for profound first degree AV block in the setting of a modest valvular cardiomyopathy ejection fraction 35-40%.  He is s/p MVR for endocarditis associated with IVDA. TTE in 2010 showed normal EF with elevated gradient across MV (mean 14) with perivalvular leak   He is better since he CRT was implanted, less shortness of breath and less fatigue.the site is well-healed   He rides his bike around and has to stop periodically he doesn't shortness of breath. He has some peripheral edema. He denies chest pains.  Past Medical History  Diagnosis Date  . 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  a- Echo 07/2008 Ef 55% mild peroprosthetic MVR with High transmitral gradient (mean 14).  b- normal coronaries by cath 12/2007  . Atrial fibrillation     post op.  s/p dc-cv 04/2008. previously on amiodarone  . Atrial flutter     atypical  . Heavy alcohol use     history  . IV drug abuse     history of  . CHF (congestive heart failure)     EF 35-40 % 2011 due to valvular disease and diastolic dysfunction  . History of GI bleed   . Hypertension   . GERD (gastroesophageal reflux disease)   . Chronic back pain   . AV block, 1st degree     .450 msec--progressive  . Dysphagia     with normal barium swallow 09/2008    History reviewed. No pertinent past surgical history.  Current Outpatient Prescriptions  Medication Sig Dispense Refill  . carvedilol (COREG) 6.25 MG tablet Take 1.5 tablets (9.375 mg total) by mouth 2 (two) times daily with a meal.  90 tablet  6  . furosemide (LASIX) 20 MG tablet Take 1 tablet (20 mg total) by mouth as needed.  30 tablet  6  . HYDROcodone-acetaminophen (NORCO/VICODIN) 5-325 MG per tablet 1 tablet as directed.       Marland Kitchen KLOR-CON M20 20 MEQ tablet Take 1 tablet by  mouth Daily.      Marland Kitchen lisinopril (PRINIVIL,ZESTRIL) 20 MG tablet Take 1 tablet (20 mg total) by mouth daily.  30 tablet  12  . omeprazole (PRILOSEC) 20 MG capsule Take 20 mg by mouth daily.        . ranitidine (ZANTAC) 150 MG tablet Take 150 mg by mouth 2 (two) times daily as needed.      . warfarin (COUMADIN) 5 MG tablet Take 5 mg by mouth as directed. Take as directed by anticoagulation clinic         No Known Allergies  Review of Systems negative except from HPI and PMH  Physical Exam BP 119/64  Pulse 82  Ht 5\' 8"  (1.727 m)  Wt 180 lb (81.647 kg)  BMI 27.37 kg/m2 Well developed and well nourished in no acute distress HENT normal E scleral and icterus clear Neck Supple JVP 7-8 carotids brisk and full Clear to ausculation Regular rate and rhythm mechanical S1 and a 2/6 diastolic murmur at the right upper sternal border Soft with active bowel sounds No clubbing cyanosis 2+ Edema Alert and oriented, grossly normal motor and sensory function Skin Warm and Dry  Echocardiogram demonstrates P. synchronous pacing  Assessment and  Plan

## 2012-04-05 NOTE — Assessment & Plan Note (Signed)
Currently not all that symptomatic but has evidence of volume overload. We will check his metabolic profile. We'll have him take his Lasix daily. He was referred back to heart failure clinic.

## 2012-04-05 NOTE — Patient Instructions (Addendum)
Your physician wants you to follow-up in: 6 months with device clinic and 1 year with Dr Graciela Husbands.  You will receive a reminder letter in the mail two months in advance. If you don't receive a letter, please call our office to schedule the follow-up appointment.  Start taking your Lasix 20mg  daily.  LABS TODAY:  BMET and MAG Level.  Your physician recommends that you schedule a follow-up appointment in: 4 weeks with Dr. Gala Romney in the Heart Failure Clinic.

## 2012-04-07 ENCOUNTER — Encounter: Payer: Self-pay | Admitting: Gastroenterology

## 2012-04-13 ENCOUNTER — Encounter (HOSPITAL_COMMUNITY)
Admission: RE | Admit: 2012-04-13 | Discharge: 2012-04-13 | Disposition: A | Discharge status: Home / Self Care | Payer: Medicare Other | Source: Ambulatory Visit | Attending: Nephrology | Admitting: Nephrology

## 2012-04-13 DIAGNOSIS — N183 Chronic kidney disease, stage 3 unspecified: Secondary | ICD-10-CM | POA: Insufficient documentation

## 2012-04-13 DIAGNOSIS — D638 Anemia in other chronic diseases classified elsewhere: Secondary | ICD-10-CM | POA: Insufficient documentation

## 2012-04-13 LAB — POCT HEMOGLOBIN-HEMACUE: Hemoglobin: 10.4 g/dL — ABNORMAL LOW (ref 13.0–17.0)

## 2012-04-13 LAB — IRON AND TIBC: Iron: 44 ug/dL (ref 42–135)

## 2012-04-13 LAB — FERRITIN: Ferritin: 415 ng/mL — ABNORMAL HIGH (ref 22–322)

## 2012-04-13 LAB — RENAL FUNCTION PANEL
Albumin: 4.3 g/dL (ref 3.5–5.2)
BUN: 21 mg/dL (ref 6–23)
Chloride: 103 mEq/L (ref 96–112)
Creatinine, Ser: 1.64 mg/dL — ABNORMAL HIGH (ref 0.50–1.35)
GFR calc non Af Amer: 41 mL/min — ABNORMAL LOW (ref >90)
Phosphorus: 2.4 mg/dL (ref 2.3–4.6)
Potassium: 3.9 mEq/L (ref 3.5–5.1)

## 2012-04-13 MED ORDER — EPOETIN ALFA 20000 UNIT/ML IJ SOLN
INTRAMUSCULAR | Status: AC
  Administered 2012-04-13: 20000 [IU] via SUBCUTANEOUS
  Filled 2012-04-13: qty 1

## 2012-04-13 MED ORDER — EPOETIN ALFA 10000 UNIT/ML IJ SOLN: 20000.0000 [IU] | INTRAMUSCULAR | Status: DC

## 2012-04-15 ENCOUNTER — Ambulatory Visit (INDEPENDENT_AMBULATORY_CARE_PROVIDER_SITE_OTHER): Payer: Medicare Other

## 2012-04-15 DIAGNOSIS — I499 Cardiac arrhythmia, unspecified: Secondary | ICD-10-CM

## 2012-04-15 DIAGNOSIS — Z7901 Long term (current) use of anticoagulants: Secondary | ICD-10-CM

## 2012-04-15 DIAGNOSIS — I4892 Unspecified atrial flutter: Secondary | ICD-10-CM

## 2012-04-15 LAB — POCT INR: INR: 2.4

## 2012-05-02 ENCOUNTER — Ambulatory Visit (HOSPITAL_COMMUNITY)
Admission: RE | Admit: 2012-05-02 | Discharge: 2012-05-02 | Disposition: A | Discharge status: Home / Self Care | Payer: Medicare Other | Source: Ambulatory Visit | Attending: Internal Medicine | Admitting: Internal Medicine

## 2012-05-02 ENCOUNTER — Encounter (HOSPITAL_COMMUNITY): Payer: Self-pay

## 2012-05-02 VITALS — BP 112/70 | HR 95 | Wt 181.8 lb

## 2012-05-02 DIAGNOSIS — I059 Rheumatic mitral valve disease, unspecified: Secondary | ICD-10-CM

## 2012-05-02 DIAGNOSIS — I5022 Chronic systolic (congestive) heart failure: Secondary | ICD-10-CM

## 2012-05-02 NOTE — Assessment & Plan Note (Addendum)
He is doing well. NYHA I. Volume status stable. Continue current regimen. Reinforced medication compliance and daily weights. Follow up in 3 months.

## 2012-05-02 NOTE — Patient Instructions (Addendum)
Follow up in 3 months   Do the following things EVERYDAY: 1) Weigh yourself in the morning before breakfast. Write it down and keep it in a log. 2) Take your medicines as prescribed 3) Eat low salt foods-Limit salt (sodium) to 2000 mg per day.  4) Stay as active as you can everyday 5) Limit all fluids for the day to less than 2 liters  

## 2012-05-04 ENCOUNTER — Other Ambulatory Visit: Payer: Self-pay | Admitting: *Deleted

## 2012-05-04 ENCOUNTER — Telehealth: Payer: Self-pay

## 2012-05-04 MED ORDER — WARFARIN SODIUM 5 MG PO TABS: 5.0000 mg | ORAL_TABLET | ORAL | Status: DC

## 2012-05-04 NOTE — Telephone Encounter (Signed)
Patient would like warfarin refilled. We did not originally prescribe that for him but he says he is a patient of Dr Audria Nine so he was hoping she could rx that for him.  Best [phone]

## 2012-05-04 NOTE — Telephone Encounter (Signed)
I agree; pt should continue at Coumadin Clinic at [facility].

## 2012-05-04 NOTE — Telephone Encounter (Signed)
patient notified and voiced understanding. He took his last coumadin this am. I advised to call coumadin clinic first thing in the am and no [place] then give [place] a call.

## 2012-05-04 NOTE — Telephone Encounter (Signed)
He is in Coumadin clinic at [facility], I think they should continue to prescribe, please advise.

## 2012-05-05 ENCOUNTER — Other Ambulatory Visit: Payer: Self-pay

## 2012-05-05 NOTE — Telephone Encounter (Signed)
Patient calling for rx refill. Please call [phone]  [facility]

## 2012-05-06 ENCOUNTER — Ambulatory Visit (INDEPENDENT_AMBULATORY_CARE_PROVIDER_SITE_OTHER): Payer: Medicare Other

## 2012-05-06 DIAGNOSIS — Z7901 Long term (current) use of anticoagulants: Secondary | ICD-10-CM

## 2012-05-06 DIAGNOSIS — I4892 Unspecified atrial flutter: Secondary | ICD-10-CM

## 2012-05-06 DIAGNOSIS — I499 Cardiac arrhythmia, unspecified: Secondary | ICD-10-CM

## 2012-05-06 LAB — POCT INR: INR: 3.3

## 2012-05-06 MED ORDER — WARFARIN SODIUM 5 MG PO TABS: ORAL_TABLET | ORAL | Status: DC

## 2012-05-10 ENCOUNTER — Encounter: Payer: Self-pay | Admitting: Family Medicine

## 2012-05-10 ENCOUNTER — Ambulatory Visit (INDEPENDENT_AMBULATORY_CARE_PROVIDER_SITE_OTHER): Payer: Medicare Other | Admitting: Family Medicine

## 2012-05-10 ENCOUNTER — Ambulatory Visit: Payer: Medicare Other

## 2012-05-10 ENCOUNTER — Other Ambulatory Visit (HOSPITAL_COMMUNITY): Payer: Self-pay | Admitting: *Deleted

## 2012-05-10 VITALS — BP 112/66 | HR 79 | Temp 98.6°F | Resp 16 | Ht 67.0 in | Wt 180.6 lb

## 2012-05-10 DIAGNOSIS — N400 Enlarged prostate without lower urinary tract symptoms: Secondary | ICD-10-CM

## 2012-05-10 NOTE — Patient Instructions (Signed)
Prostate Problems  The prostate gland is part of the reproductive system of men. A normal prostate is about the size and shape of a walnut. The prostate may grow as a man ages. The gland makes a fluid that is mixed with sperm to make semen. This gland is located in front of the rectum and just below the bladder, where urine is stored. The prostate surrounds the urethra. The urethra is the tube through which urine passes out of the body.  COMMON PROSTATE PROBLEMS  · Prostatitis  · The most common prostate problem in men under 50 is inflammation of the prostate gland (prostatitis).  · This is generally an infection that enters the prostate from the urethra.  · It may be sexually transmitted.  · It could be caused by a slow growing cancer.  · If not caused by cancer, treatment with antibiotics is usually very effective. In some cases, symptoms may be slow to go away and may come back. This condition is commonly called chronic prostatitis.  · Benign Prostatic Hypertrophy (BPH)  · BPH is an enlarged prostate.  · There is no cancer present with this condition.  · The exact cause is not known; but it is one of the most common problems for men over age 50.  · If your caregiver finds BPH, but there are no symptoms or mild symptoms, you may need examinations once or twice a year.  · Prostate cancer  · Symptoms of prostate cancer will vary depending on how big the tumor is and whether it has spread beyond the prostate. If it has spread, your caregiver must find out how far it has spread.  · Prostate cancer is treated by various combinations of surgery, radiation therapy, hormonal therapy, and chemotherapy. Sometimes the prostate cancer is just observed to determine the need for treatment.  · Some men with enlarged prostates have no symptoms at all.  · Symptoms vary a lot. They are usually referred to as "lower urinary tract symptoms" (LUTS).  SYMPTOMS   · Frequent urination.  · Getting up often during the night to  urinate.  · A feeling that you still have a full bladder after passing your urine.  · A weak stream or dribbling after urination.  · Having to push or strain to pass your urine.  · Fever.  · Pain in the low back or groin.  · Blood in the urine.  · Discharge from the penis.  · Weight loss.  · There may be visible enlargement of the bladder.  · In severe cases, you may not be able to empty your bladder and there is severe pain. This is an emergency and requires immediate medical care. If this occurs many times, you can develop permanent damage to the bladder and kidneys.  · Different prostate problems may have similar symptoms. In the early stages, there may be no symptoms at all.  DIAGNOSIS   · If you have urination problems, or a digital rectal exam (DRE) or prostate-specific antigen (PSA) test indicates that you might have a problem, additional tests will be suggested.  · Ask your caregiver if any special preparations are needed before your diagnostic tests.  TREATMENT   · If your caregiver finds BPH and you have bothersome symptoms, medications can be taken by mouth to help.  · If medications are not helpful, surgery may be advised. Different procedures use different methods to heat, destroy, and remove a small amount of the prostate tissue. These methods include   the use of:  · Microwaves.  · High frequency sound waves.  · A laser.  · An electric charge.  · Other surgeries are available. All of these are preceded by appropriate anesthesia.  · The surgeon scrapes away part of the inside of the prostate using a small scope put into the urethra. This reduces the squeezing on the urethra.  · The surgeon makes small cuts in the prostate to reduce the squeezing pressure on the urethra.  · Removal of the entire prostate is carried out through a small incision.  · Removal of the entire prostate through a larger incision may occur in situations where the surgeon feels the other operations are not appropriate.  TYPES OF  TESTS  DRE  · You may be asked to bend over a table or to lie on your side holding your knees close to your chest. Your caregiver advances a gloved, lubricated finger into the rectum and feels the part of the prostate that lies next to it. You may find the DRE slightly uncomfortable, but it is very brief.  · This exam tells the caregiver whether the gland has any bumps, irregularities, or soft or hard spots that require additional tests.  · If a prostate infection is suspected, the caregiver might press the prostate during the DRE to obtain fluid for examination under a microscope.  PSA Blood Test  · The amount of PSA, a protein produced by prostate cells, is often higher in the blood of men who have prostate cancer. However, an elevated level of PSA does not necessarily mean you have cancer.  · Healthy men should no longer receive PSA blood tests as part of routine cancer screening. Consult with your caregiver about prostate cancer screening.  Urinalysis  · This can help find an infection if one is present.  Transrectal Ultrasound and Prostate Biopsy  · If prostate cancer is suspected, your caregiver may recommend a transrectal ultrasound.  · A probe is inserted into the rectum. The probe directs high frequency sound waves at the prostate, and the created image is visible on a monitor screen.  · The image shows the size of the prostate and any abnormalities. This cannot clearly identify tumors.  · To determine whether an abnormality is a tumor, the caregiver may use the probe and the ultrasound images to guide a biopsy needle to the abnormality. Prostate tissue samples will be collected for examination under a microscope. A specialist will look at the tissue samples to see if cancer is present.  MRI and CT Scans  · MRI and CT scans both use computers to create images of internal organs.  · These tests can help identify abnormal structures.  · They cannot show a difference between cancerous tumors and noncancerous  growths.  · If a biopsy confirms cancer, your caregiver might use these imaging techniques to determine how far the cancer has spread. In most cases, these tests are not required. Your caregiver will discuss the need for these tests if he or she feels they are indicated.  Urodynamic Tests  · A weak stream of urine and difficulty emptying the bladder fully may be signs of urine blockage caused by an enlarged prostate that is squeezing the urethra.  · If your problem appears to be related to a blockage, your caregiver may recommend tests that measure bladder pressure and urine flow rate.  · You may be asked to urinate into a device that measures how quickly the urine is flowing. It   will record how many seconds it takes for the peak flow rate to be reached.  · Another test measures post-void residual. This is the amount of urine left in your bladder when you have finished passing urine.  Intravenous Pyelogram (IVP)   · IVP is an X-ray of the urinary tract.  · In this test, a fluid (contrast) is injected into a vein. X-ray pictures are taken at different times to see the progression of contrast through the kidney and ureter.  · The contrast makes the urine visible on the X-ray and shows any narrowing or blockage in the urinary tract.  · This procedure can help show problems in the kidneys, ureters, or bladder that may have come from urine retention or backup.  Abdominal Ultrasound  · For an abdominal ultrasound exam, gel will be applied to your lower abdomen. A handheld device will be moved across the lower abdomen to record a picture of your entire urinary tract.  · An abdominal ultrasound can show damage in the upper urinary tract that comes from urine blockage.  Cystoscopy  · A solution will numb the inside of the penis. A small tube (cystoscope) is inserted through the urethral opening at the tip of the penis.  · The tube allows your caregiver to see the inside of the urethra and bladder.  · The caregiver can  determine the location and amount of the obstruction causing problems.  Finding out the results of your test  Not all test results are available during your visit. If your test results are not back during the visit, make an appointment with your caregiver to find out the results. Do not assume everything is normal if you have not heard from your caregiver or the medical facility. It is important for you to follow up on all of your test results.  HOME CARE INSTRUCTIONS   Home care instructions after diagnostic testing will vary dependent upon the procedure performed.  · Care after urodynamic tests or a cystoscopy:  · You may have mild discomfort for a few hours.  · Drinking two, 8 ounce (240 mL) glasses of water each hour, for 2 hours should help.  · Ask your caregiver whether you can take a warm bath. If not, you may be able to hold a warm, damp washcloth over the urethral opening to relieve the discomfort.  · Care after a prostate biopsy:  · A prostate biopsy may produce pain in the area of the rectum and the area between the rectum and the scrotum (the perineum).  · Only take over-the-counter or prescription medications for pain, discomfort, or fever as directed by your caregiver.  · You may be given antibiotics to prevent an infection.  SEEK MEDICAL CARE IF:  · You have any sign of an infection including pain with urination, chills, or fever.  FOR MORE INFORMATION  American Foundation for Urologic Disease: www.urologyhealth.org  National Cancer Institute (NCI): www.nci.nih.gov  The National Institute of Diabetes and Digestive and Kidney Diseases (NIDDK): www.niddk.nih.gov  The Prostatitis Foundation: www.prostatitis.org  Document Released: 04/19/2007 Document Revised: 09/14/2011 Document Reviewed: 01/04/2009  [employer]® Patient Information ©2013 [employer].

## 2012-05-10 NOTE — Progress Notes (Signed)
S:  This 66 y.o. AA male is here today for prostate examination. He reports nocturia x 2, dribbling and decreased stream. He denies abdominal or pelvic discomfort, hematuria, dysuria, fever/ chills or unexplained weight change. He states his friend suggested he come in for evaluation. Re: HCM- last colonoscopy was "sometime ago"; he does not recall if any abnormalities were found.  ROS: As per HPI.  O:  Filed Vitals:   05/10/12 1424  BP: 112/66  Pulse: 79  Temp: 98.6 F (37 C)  Resp: 16   GEN: In NAD; appears stated age. WN,WD. HEENT: [place]/AT; EOMI with clear conj and muddy sclerae. Otherwise benign. COR: RRR with audible click. LUNGS: CTA w/o rales or rhonchi. GU: Normal rectum with normal tone, no hemorrhoids or internal mass. Prostate is enlarged w/o discrete nodule. Stool is dark reddish-brown. NEURO: A&O x 3; CNs intact. Nonfocal.   Results for orders placed in visit on 05/10/12  IFOBT (OCCULT BLOOD)      Component Value Range   IFOBT Negative       A/P: 1. Prostate enlargement  IFOBT POC (occult bld, rslt in office), PSA

## 2012-05-11 ENCOUNTER — Encounter (HOSPITAL_COMMUNITY)
Admission: RE | Admit: 2012-05-11 | Discharge: 2012-05-11 | Disposition: A | Discharge status: Home / Self Care | Payer: Medicare Other | Source: Ambulatory Visit | Attending: Nephrology | Admitting: Nephrology

## 2012-05-11 DIAGNOSIS — N183 Chronic kidney disease, stage 3 unspecified: Secondary | ICD-10-CM | POA: Insufficient documentation

## 2012-05-11 DIAGNOSIS — D638 Anemia in other chronic diseases classified elsewhere: Secondary | ICD-10-CM | POA: Insufficient documentation

## 2012-05-11 LAB — POCT HEMOGLOBIN-HEMACUE: Hemoglobin: 10.4 g/dL — ABNORMAL LOW (ref 13.0–17.0)

## 2012-05-11 LAB — PSA: PSA: 2.09 ng/mL (ref <=4.00)

## 2012-05-11 MED ORDER — FERUMOXYTOL INJECTION 510 MG/17 ML
INTRAVENOUS | Status: AC
  Administered 2012-05-11: 510 mg
  Filled 2012-05-11: qty 17

## 2012-05-11 MED ORDER — FERUMOXYTOL INJECTION 510 MG/17 ML: 510.0000 mg | INTRAVENOUS | Status: DC

## 2012-05-11 MED ORDER — SODIUM CHLORIDE 0.9 % IV SOLN
INTRAVENOUS | Status: DC
  Administered 2012-05-11: 09:00:00 via INTRAVENOUS

## 2012-05-11 MED ORDER — EPOETIN ALFA 10000 UNIT/ML IJ SOLN: 20000.0000 [IU] | INTRAMUSCULAR | Status: DC

## 2012-05-11 MED ORDER — EPOETIN ALFA 20000 UNIT/ML IJ SOLN
INTRAMUSCULAR | Status: AC
  Administered 2012-05-11: 20000 [IU]
  Filled 2012-05-11: qty 1

## 2012-05-11 NOTE — Progress Notes (Signed)
Quick Note:  Please notify pt that results are normal.   Provide pt with copy of labs. ______ 

## 2012-05-14 NOTE — Progress Notes (Signed)
Patient ID: [name], male   DOB: December 17, 1945, 66 y.o.   MRN: [id number] HPI:  [name] is a 66 year old male with h/o polysubstance abuse c/b MV endocarditis s/p St. Jude MVR, CHF due to NICM 35-40% and PAF.  Underwent CRT-P implant for bradycardia and fatigue.   TTE in 2010 showed normal EF with elevated gradient across MV (mean 14) with perivalvular leak. Follow-up TEE in June 2010 with EF 40-45% mild stenosis mean gradient = 8. No vegetation or abscess.   Echo 8/12 EF 35% mild perivalvular MR.  Echo 3/1/3 EF 50% mild MVR stable with mild peri-valvular leak.   He returns for follow up. Denies SOB/PND/orthopnea. Rides his bike daily 4-5 miles without stopping. Denies bleeding. Compliant with medications. Weight at home 180 pounds. He has not required any lasix.     ROS: All systems negative except as listed in HPI, PMH and Problem List.  Past Medical History  Diagnosis Date  . 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  a- Echo 07/2008 Ef 55% mild peroprosthetic MVR with High transmitral gradient (mean 14).  b- normal coronaries by cath 12/2007  . Atrial fibrillation     post op.  s/p dc-cv 04/2008. previously on amiodarone  . Atrial flutter     atypical  . Heavy alcohol use     history  . IV drug abuse     history of  . CHF (congestive heart failure)     EF 35-40 % 2011 due to valvular disease and diastolic dysfunction  . History of GI bleed   . Hypertension   . GERD (gastroesophageal reflux disease)   . Chronic back pain   . AV block, 1st degree     .450 msec--progressive  . Dysphagia     with normal barium swallow 09/2008  . Pacemaker 2010    Current Outpatient Prescriptions  Medication Sig Dispense Refill  . carvedilol (COREG) 6.25 MG tablet Take 1.5 tablets (9.375 mg total) by mouth 2 (two) times daily with a meal.  90 tablet  6  . furosemide (LASIX) 20 MG tablet Take 1 tablet (20 mg total) by mouth as needed.  30 tablet  6  .  HYDROcodone-acetaminophen (NORCO/VICODIN) 5-325 MG per tablet 1 tablet as directed.       Marland Kitchen KLOR-CON M20 20 MEQ tablet Take 1 tablet by mouth Daily.      Marland Kitchen lisinopril (PRINIVIL,ZESTRIL) 20 MG tablet Take 10 mg by mouth daily.      Marland Kitchen omeprazole (PRILOSEC) 20 MG capsule Take 20 mg by mouth daily.        . ranitidine (ZANTAC) 150 MG tablet Take 150 mg by mouth 2 (two) times daily as needed.      . warfarin (COUMADIN) 5 MG tablet Take as directed by anticoagulation clinic  60 tablet  1     PHYSICAL EXAM: Filed Vitals:   05/02/12 0928  BP: 112/70  Pulse: 95   Gen: well appearing. no resp difficulty HEENT: normal Neck: supple. no JVD. Carotids 2+ bilat; no bruits. No  Cor: PMI nondisplaced. Regular. No rubs, gallops. mechanical s1. soft diastolic mitral murmur. Pacer site looks good. Lungs: clear Abdomen: soft, nontender, nondistended. No hepatosplenomegaly. No bruits or masses. Good bowel sounds. Extremities: no cyanosis, clubbing, rash, edema Neuro: alert & orientedx3, cranial nerves grossly intact. moves all 4 extremities w/o difficulty. affect pleasant   ASSESSMENT & PLAN:

## 2012-05-14 NOTE — Assessment & Plan Note (Signed)
Stable. Continue warfarin. Reviewed need for SBE prophylaxis.

## 2012-05-18 ENCOUNTER — Encounter (HOSPITAL_COMMUNITY)
Admission: RE | Admit: 2012-05-18 | Discharge: 2012-05-18 | Disposition: A | Discharge status: Home / Self Care | Payer: Medicare Other | Source: Ambulatory Visit | Attending: Nephrology | Admitting: Nephrology

## 2012-05-18 MED ORDER — SODIUM CHLORIDE 0.9 % IV SOLN
INTRAVENOUS | Status: AC
  Administered 2012-05-18: 11:00:00 via INTRAVENOUS

## 2012-05-18 MED ORDER — FERUMOXYTOL INJECTION 510 MG/17 ML
510.0000 mg | INTRAVENOUS | Status: AC
  Administered 2012-05-18: 510 mg via INTRAVENOUS

## 2012-05-18 MED ORDER — FERUMOXYTOL INJECTION 510 MG/17 ML
INTRAVENOUS | Status: AC
  Administered 2012-05-18: 510 mg via INTRAVENOUS
  Filled 2012-05-18: qty 17

## 2012-05-27 ENCOUNTER — Ambulatory Visit (INDEPENDENT_AMBULATORY_CARE_PROVIDER_SITE_OTHER): Payer: Medicare Other | Admitting: Pharmacist

## 2012-05-27 DIAGNOSIS — Z7901 Long term (current) use of anticoagulants: Secondary | ICD-10-CM

## 2012-05-27 DIAGNOSIS — I4892 Unspecified atrial flutter: Secondary | ICD-10-CM

## 2012-05-27 DIAGNOSIS — I499 Cardiac arrhythmia, unspecified: Secondary | ICD-10-CM

## 2012-06-02 NOTE — Addendum Note (Signed)
Encounter addended by: [name], MD on: 06/02/2012  9:10 PM<BR>     Documentation filed: Notes Section

## 2012-06-08 ENCOUNTER — Encounter (HOSPITAL_COMMUNITY)
Admission: RE | Admit: 2012-06-08 | Discharge: 2012-06-08 | Disposition: A | Discharge status: Home / Self Care | Payer: Medicare Other | Source: Ambulatory Visit | Attending: Nephrology | Admitting: Nephrology

## 2012-06-08 DIAGNOSIS — D638 Anemia in other chronic diseases classified elsewhere: Secondary | ICD-10-CM | POA: Insufficient documentation

## 2012-06-08 DIAGNOSIS — N183 Chronic kidney disease, stage 3 unspecified: Secondary | ICD-10-CM | POA: Insufficient documentation

## 2012-06-08 LAB — RENAL FUNCTION PANEL
Albumin: 4.1 g/dL (ref 3.5–5.2)
BUN: 21 mg/dL (ref 6–23)
Chloride: 104 mEq/L (ref 96–112)
Creatinine, Ser: 1.64 mg/dL — ABNORMAL HIGH (ref 0.50–1.35)
Glucose, Bld: 99 mg/dL (ref 70–99)

## 2012-06-08 LAB — FERRITIN: Ferritin: 741 ng/mL — ABNORMAL HIGH (ref 22–322)

## 2012-06-08 LAB — POCT HEMOGLOBIN-HEMACUE: Hemoglobin: 11.4 g/dL — ABNORMAL LOW (ref 13.0–17.0)

## 2012-06-08 LAB — IRON AND TIBC
TIBC: 226 ug/dL (ref 215–435)
UIBC: 157 ug/dL (ref 125–400)

## 2012-06-08 MED ORDER — EPOETIN ALFA 10000 UNIT/ML IJ SOLN: 20000.0000 [IU] | INTRAMUSCULAR | Status: DC

## 2012-06-08 MED ORDER — EPOETIN ALFA 20000 UNIT/ML IJ SOLN
INTRAMUSCULAR | Status: AC
  Administered 2012-06-08: 20000 [IU]
  Filled 2012-06-08: qty 1

## 2012-06-10 ENCOUNTER — Ambulatory Visit (INDEPENDENT_AMBULATORY_CARE_PROVIDER_SITE_OTHER): Payer: Medicare Other | Admitting: *Deleted

## 2012-06-10 DIAGNOSIS — I4892 Unspecified atrial flutter: Secondary | ICD-10-CM

## 2012-06-10 DIAGNOSIS — Z7901 Long term (current) use of anticoagulants: Secondary | ICD-10-CM

## 2012-06-10 DIAGNOSIS — I499 Cardiac arrhythmia, unspecified: Secondary | ICD-10-CM

## 2012-06-24 ENCOUNTER — Ambulatory Visit (INDEPENDENT_AMBULATORY_CARE_PROVIDER_SITE_OTHER): Payer: Medicare Other | Admitting: *Deleted

## 2012-06-24 DIAGNOSIS — Z7901 Long term (current) use of anticoagulants: Secondary | ICD-10-CM

## 2012-06-24 DIAGNOSIS — I4892 Unspecified atrial flutter: Secondary | ICD-10-CM

## 2012-06-24 DIAGNOSIS — I499 Cardiac arrhythmia, unspecified: Secondary | ICD-10-CM

## 2012-07-07 ENCOUNTER — Encounter (HOSPITAL_COMMUNITY): Payer: Medicare Other

## 2012-07-22 ENCOUNTER — Encounter (INDEPENDENT_AMBULATORY_CARE_PROVIDER_SITE_OTHER): Payer: Medicare Other | Admitting: *Deleted

## 2012-07-22 DIAGNOSIS — Z7901 Long term (current) use of anticoagulants: Secondary | ICD-10-CM

## 2012-07-22 DIAGNOSIS — I4892 Unspecified atrial flutter: Secondary | ICD-10-CM

## 2012-07-22 DIAGNOSIS — I499 Cardiac arrhythmia, unspecified: Secondary | ICD-10-CM

## 2012-08-04 ENCOUNTER — Ambulatory Visit (HOSPITAL_COMMUNITY): Payer: Medicare Other

## 2012-08-11 ENCOUNTER — Ambulatory Visit (HOSPITAL_COMMUNITY)
Admission: RE | Admit: 2012-08-11 | Discharge: 2012-08-11 | Disposition: A | Discharge status: Home / Self Care | Payer: Medicare Other | Source: Ambulatory Visit | Attending: Internal Medicine | Admitting: Internal Medicine

## 2012-08-11 VITALS — BP 102/70 | HR 99 | Wt 188.5 lb

## 2012-08-11 DIAGNOSIS — I5022 Chronic systolic (congestive) heart failure: Secondary | ICD-10-CM | POA: Insufficient documentation

## 2012-08-11 NOTE — Patient Instructions (Addendum)
Follow up in 4 months with ECHO  Do the following things EVERYDAY: 1) Weigh yourself in the morning before breakfast. Write it down and keep it in a log. 2) Take your medicines as prescribed 3) Eat low salt foods-Limit salt (sodium) to 2000 mg per day.  4) Stay as active as you can everyday 5) Limit all fluids for the day to less than 2 liters

## 2012-08-11 NOTE — Progress Notes (Signed)
Patient ID: [name], male   DOB: 30-Mar-1946, 67 y.o.   MRN: [id number] HPI:  [name] is a 67 year old male with h/o polysubstance abuse c/b MV endocarditis s/p St. Jude MVR, CHF due to NICM 35-40% and PAF.  Underwent CRT-P implant for bradycardia and fatigue.   TTE in 2010 showed normal EF with elevated gradient across MV (mean 14) with perivalvular leak. Follow-up TEE in June 2010 with EF 40-45% mild stenosis mean gradient = 8. No vegetation or abscess.   Echo 8/12 EF 35% mild perivalvular MR.  Echo 3/13 EF 50% mild MVR stable with mild peri-valvular leak.   He returns for follow up. Denies SOB/PND/orthopnea. Rides his bike daily 3 miles without stopping. Denies bleeding. Compliant with medications. He has only been taking 6.25 mg carvedilol because he was confused about dosage. Weight at home 185 pounds. He take lasix 1-2 times a week.      ROS: All systems negative except as listed in HPI, PMH and Problem List.  Past Medical History  Diagnosis Date  . 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  a- Echo 07/2008 Ef 55% mild peroprosthetic MVR with High transmitral gradient (mean 14).  b- normal coronaries by cath 12/2007  . Atrial fibrillation     post op.  s/p dc-cv 04/2008. previously on amiodarone  . Atrial flutter     atypical  . Heavy alcohol use     history  . IV drug abuse     history of  . CHF (congestive heart failure)     EF 35-40 % 2011 due to valvular disease and diastolic dysfunction  . History of GI bleed   . Hypertension   . GERD (gastroesophageal reflux disease)   . Chronic back pain   . AV block, 1st degree     .450 msec--progressive  . Dysphagia     with normal barium swallow 09/2008  . Pacemaker 2010    Current Outpatient Prescriptions  Medication Sig Dispense Refill  . carvedilol (COREG) 6.25 MG tablet Take 1.5 tablets (9.375 mg total) by mouth 2 (two) times daily with a meal.  90 tablet  6  . furosemide  (LASIX) 20 MG tablet Take 1 tablet (20 mg total) by mouth as needed.  30 tablet  6  . HYDROcodone-acetaminophen (NORCO/VICODIN) 5-325 MG per tablet 1 tablet as directed.       Marland Kitchen KLOR-CON M20 20 MEQ tablet Take 1 tablet by mouth Daily.      Marland Kitchen lisinopril (PRINIVIL,ZESTRIL) 20 MG tablet Take 10 mg by mouth daily.      Marland Kitchen omeprazole (PRILOSEC) 20 MG capsule Take 20 mg by mouth daily.        . ranitidine (ZANTAC) 150 MG tablet Take 150 mg by mouth 2 (two) times daily as needed.      . warfarin (COUMADIN) 5 MG tablet Take as directed by anticoagulation clinic  60 tablet  1     PHYSICAL EXAM: Filed Vitals:   08/11/12 0924  BP: 102/70  Pulse: 99   Gen: well appearing. no resp difficulty HEENT: normal Neck: supple. no JVD. Carotids 2+ bilat; no bruits. No  Cor: PMI nondisplaced. Regular. No rubs, gallops. mechanical s1. soft diastolic mitral murmur. Pacer site looks good. Lungs: clear Abdomen: soft, nontender, nondistended. No hepatosplenomegaly. No bruits or masses. Good bowel sounds. Extremities: no cyanosis, clubbing, rash, edema Neuro: alert & orientedx3, cranial nerves grossly intact. moves all 4 extremities w/o  difficulty. affect pleasant   ASSESSMENT & PLAN:

## 2012-08-11 NOTE — Assessment & Plan Note (Signed)
NYHA I. Volume status stable. Continue current regimen. Follow up in 4 months with an ECHO.

## 2012-08-26 ENCOUNTER — Ambulatory Visit (INDEPENDENT_AMBULATORY_CARE_PROVIDER_SITE_OTHER): Payer: Medicare Other | Admitting: *Deleted

## 2012-08-26 DIAGNOSIS — I4892 Unspecified atrial flutter: Secondary | ICD-10-CM

## 2012-08-26 DIAGNOSIS — Z7901 Long term (current) use of anticoagulants: Secondary | ICD-10-CM

## 2012-08-26 DIAGNOSIS — I499 Cardiac arrhythmia, unspecified: Secondary | ICD-10-CM

## 2012-09-23 ENCOUNTER — Ambulatory Visit (INDEPENDENT_AMBULATORY_CARE_PROVIDER_SITE_OTHER): Payer: Medicare Other

## 2012-09-23 DIAGNOSIS — I499 Cardiac arrhythmia, unspecified: Secondary | ICD-10-CM

## 2012-09-23 DIAGNOSIS — I4892 Unspecified atrial flutter: Secondary | ICD-10-CM

## 2012-09-23 DIAGNOSIS — Z7901 Long term (current) use of anticoagulants: Secondary | ICD-10-CM

## 2012-10-05 ENCOUNTER — Ambulatory Visit (INDEPENDENT_AMBULATORY_CARE_PROVIDER_SITE_OTHER): Payer: Medicare Other | Admitting: *Deleted

## 2012-10-05 DIAGNOSIS — I499 Cardiac arrhythmia, unspecified: Secondary | ICD-10-CM

## 2012-10-05 LAB — PACEMAKER DEVICE OBSERVATION
AL THRESHOLD: 0.75 V
ATRIAL PACING PM: 1
BAMS-0003: 70 {beats}/min
DEVICE MODEL PM: 2665541
RV LEAD IMPEDENCE PM: 412.5 Ohm
RV LEAD THRESHOLD: 0.75 V

## 2012-10-05 NOTE — Progress Notes (Signed)
PPM check 

## 2012-10-14 ENCOUNTER — Ambulatory Visit (INDEPENDENT_AMBULATORY_CARE_PROVIDER_SITE_OTHER): Payer: Medicare Other | Admitting: Pharmacist

## 2012-10-14 DIAGNOSIS — I499 Cardiac arrhythmia, unspecified: Secondary | ICD-10-CM

## 2012-10-14 DIAGNOSIS — I4892 Unspecified atrial flutter: Secondary | ICD-10-CM

## 2012-10-14 DIAGNOSIS — Z7901 Long term (current) use of anticoagulants: Secondary | ICD-10-CM

## 2012-10-14 LAB — POCT INR: INR: 2.1

## 2012-11-02 ENCOUNTER — Encounter: Payer: Self-pay | Admitting: Internal Medicine

## 2012-11-04 ENCOUNTER — Ambulatory Visit (INDEPENDENT_AMBULATORY_CARE_PROVIDER_SITE_OTHER): Payer: Medicare Other | Admitting: *Deleted

## 2012-11-04 DIAGNOSIS — I499 Cardiac arrhythmia, unspecified: Secondary | ICD-10-CM

## 2012-11-04 DIAGNOSIS — I4892 Unspecified atrial flutter: Secondary | ICD-10-CM

## 2012-11-04 DIAGNOSIS — Z7901 Long term (current) use of anticoagulants: Secondary | ICD-10-CM

## 2012-11-10 ENCOUNTER — Other Ambulatory Visit: Payer: Self-pay | Admitting: *Deleted

## 2012-11-10 ENCOUNTER — Other Ambulatory Visit: Payer: Self-pay | Admitting: Pharmacist

## 2012-11-10 MED ORDER — WARFARIN SODIUM 5 MG PO TABS: ORAL_TABLET | ORAL | Status: DC

## 2012-12-02 ENCOUNTER — Ambulatory Visit (INDEPENDENT_AMBULATORY_CARE_PROVIDER_SITE_OTHER): Payer: Medicare Other | Admitting: *Deleted

## 2012-12-02 DIAGNOSIS — I499 Cardiac arrhythmia, unspecified: Secondary | ICD-10-CM

## 2012-12-02 DIAGNOSIS — Z7901 Long term (current) use of anticoagulants: Secondary | ICD-10-CM

## 2012-12-02 DIAGNOSIS — I4892 Unspecified atrial flutter: Secondary | ICD-10-CM

## 2012-12-16 ENCOUNTER — Ambulatory Visit (HOSPITAL_COMMUNITY)
Admission: RE | Admit: 2012-12-16 | Discharge: 2012-12-16 | Disposition: A | Discharge status: Home / Self Care | Payer: Medicare Other | Source: Ambulatory Visit | Attending: Internal Medicine | Admitting: Internal Medicine

## 2012-12-16 ENCOUNTER — Encounter (HOSPITAL_COMMUNITY): Payer: Self-pay

## 2012-12-16 ENCOUNTER — Ambulatory Visit (HOSPITAL_BASED_OUTPATIENT_CLINIC_OR_DEPARTMENT_OTHER)
Admission: RE | Admit: 2012-12-16 | Discharge: 2012-12-16 | Disposition: A | Discharge status: Home / Self Care | Payer: Medicare Other | Source: Ambulatory Visit | Attending: Internal Medicine | Admitting: Internal Medicine

## 2012-12-16 ENCOUNTER — Other Ambulatory Visit (HOSPITAL_COMMUNITY): Payer: Self-pay | Admitting: Internal Medicine

## 2012-12-16 VITALS — BP 122/68 | HR 72 | Ht 68.0 in | Wt 186.0 lb

## 2012-12-16 DIAGNOSIS — I10 Essential (primary) hypertension: Secondary | ICD-10-CM | POA: Insufficient documentation

## 2012-12-16 DIAGNOSIS — I509 Heart failure, unspecified: Secondary | ICD-10-CM | POA: Insufficient documentation

## 2012-12-16 DIAGNOSIS — I4892 Unspecified atrial flutter: Secondary | ICD-10-CM | POA: Insufficient documentation

## 2012-12-16 DIAGNOSIS — I517 Cardiomegaly: Secondary | ICD-10-CM

## 2012-12-16 DIAGNOSIS — I059 Rheumatic mitral valve disease, unspecified: Secondary | ICD-10-CM

## 2012-12-16 DIAGNOSIS — I5022 Chronic systolic (congestive) heart failure: Secondary | ICD-10-CM

## 2012-12-16 DIAGNOSIS — E785 Hyperlipidemia, unspecified: Secondary | ICD-10-CM | POA: Insufficient documentation

## 2012-12-16 DIAGNOSIS — Z87891 Personal history of nicotine dependence: Secondary | ICD-10-CM | POA: Insufficient documentation

## 2012-12-16 NOTE — Progress Notes (Signed)
  Echocardiogram 2D Echocardiogram has been performed.  [name] FRANCES 12/16/2012, 11:02 AM

## 2012-12-16 NOTE — Patient Instructions (Addendum)
Follow up in 1 year.

## 2012-12-18 NOTE — Assessment & Plan Note (Signed)
Doing well. MVR stable on exam and by echo. Discussed SBE but he is edentulous and does not go to the dentist. Tolerating coumadin well.

## 2012-12-18 NOTE — Assessment & Plan Note (Signed)
EF recovered. No HF clinically or by exam. Continue current regimen.

## 2012-12-18 NOTE — Progress Notes (Signed)
Patient ID: [name], male   DOB: 09-03-1945, 67 y.o.   MRN: [id number] HPI:  [name] is a 67 year old male with h/o polysubstance abuse c/b MV endocarditis s/p St. Jude MVR, CHF due to NICM 35-40% and PAF.  Underwent CRT-P implant for bradycardia and fatigue.   TTE in 2010 showed normal EF with elevated gradient across MV (mean 14) with perivalvular leak. Follow-up TEE in June 2010 with EF 40-45% mild stenosis mean gradient = 8. No vegetation or abscess.   Echo 8/12 EF 35% mild perivalvular MR.  Echo 3/13 EF 50% mild MVR stable with mild peri-valvular leak.  ECHO 12/16/2012 EF 50% stable MVR  He returns for follow up. Denies SOB/PND/orthopnea. Rides his bike daily 3 miles without stopping. Denies bleeding. Compliant with medications. He says he is taking 12.5 mg carvedilol daily. Weight at home 185 pounds. He takes lasix about once a month.     ROS: All systems negative except as listed in HPI, PMH and Problem List.  Past Medical History  Diagnosis Date  . 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  a- Echo 07/2008 Ef 55% mild peroprosthetic MVR with High transmitral gradient (mean 14).  b- normal coronaries by cath 12/2007  . Atrial fibrillation     post op.  s/p dc-cv 04/2008. previously on amiodarone  . Atrial flutter     atypical  . Heavy alcohol use     history  . IV drug abuse     history of  . CHF (congestive heart failure)     EF 35-40 % 2011 due to valvular disease and diastolic dysfunction  . History of GI bleed   . Hypertension   . GERD (gastroesophageal reflux disease)   . Chronic back pain   . AV block, 1st degree     .450 msec--progressive  . Dysphagia     with normal barium swallow 09/2008  . Pacemaker 2010    Current Outpatient Prescriptions  Medication Sig Dispense Refill  . carvedilol (COREG) 6.25 MG tablet Take 12.5 mg by mouth daily.      . furosemide (LASIX) 20 MG tablet Take 1 tablet (20 mg total) by mouth as  needed.  30 tablet  6  . KLOR-CON M20 20 MEQ tablet Take 1 tablet by mouth Daily.      Marland Kitchen lisinopril (PRINIVIL,ZESTRIL) 20 MG tablet Take 10 mg by mouth daily.      Marland Kitchen omeprazole (PRILOSEC) 20 MG capsule Take 20 mg by mouth daily.        . ranitidine (ZANTAC) 150 MG tablet Take 150 mg by mouth 2 (two) times daily as needed.      . traMADol (ULTRAM) 50 MG tablet Take 50 mg by mouth every 8 (eight) hours as needed for pain (as needed  for 30 days).      . warfarin (COUMADIN) 5 MG tablet Take as directed by anticoagulation clinic  60 tablet  1   No current facility-administered medications for this encounter.     PHYSICAL EXAM: Filed Vitals:   12/16/12 1044  BP: 122/68  Pulse: 72   Gen: well appearing. no resp difficulty HEENT: normal x for edentulous Neck: supple. no JVD. Carotids 2+ bilat; no bruits. No  Cor: PMI nondisplaced. Regular. No rubs, gallops. mechanical s1. soft diastolic mitral murmur. Lungs: clear Abdomen: soft, nontender, nondistended. No hepatosplenomegaly. No bruits or masses. Good bowel sounds. Extremities: no cyanosis, clubbing, rash, edema Neuro: alert &  orientedx3, cranial nerves grossly intact. moves all 4 extremities w/o difficulty. affect pleasant   ASSESSMENT & PLAN:

## 2012-12-22 ENCOUNTER — Other Ambulatory Visit (HOSPITAL_COMMUNITY): Payer: Self-pay | Admitting: *Deleted

## 2012-12-22 MED ORDER — LISINOPRIL 20 MG PO TABS: 10.0000 mg | ORAL_TABLET | Freq: Every day | ORAL | Status: DC

## 2012-12-23 ENCOUNTER — Ambulatory Visit (INDEPENDENT_AMBULATORY_CARE_PROVIDER_SITE_OTHER): Payer: Medicare Other | Admitting: Pharmacist

## 2012-12-23 DIAGNOSIS — I499 Cardiac arrhythmia, unspecified: Secondary | ICD-10-CM

## 2012-12-23 DIAGNOSIS — Z7901 Long term (current) use of anticoagulants: Secondary | ICD-10-CM

## 2012-12-23 DIAGNOSIS — I4892 Unspecified atrial flutter: Secondary | ICD-10-CM

## 2013-01-10 ENCOUNTER — Other Ambulatory Visit (HOSPITAL_COMMUNITY): Payer: Self-pay | Admitting: *Deleted

## 2013-01-10 ENCOUNTER — Other Ambulatory Visit: Payer: Self-pay | Admitting: *Deleted

## 2013-01-10 MED ORDER — CARVEDILOL 6.25 MG PO TABS: 12.5000 mg | ORAL_TABLET | Freq: Every day | ORAL | Status: DC

## 2013-01-10 MED ORDER — WARFARIN SODIUM 5 MG PO TABS: ORAL_TABLET | ORAL | Status: DC

## 2013-01-10 MED ORDER — LISINOPRIL 20 MG PO TABS: 10.0000 mg | ORAL_TABLET | Freq: Every day | ORAL | Status: DC

## 2013-01-20 ENCOUNTER — Ambulatory Visit (INDEPENDENT_AMBULATORY_CARE_PROVIDER_SITE_OTHER): Payer: Medicare Other | Admitting: *Deleted

## 2013-01-20 DIAGNOSIS — Z7901 Long term (current) use of anticoagulants: Secondary | ICD-10-CM

## 2013-01-20 DIAGNOSIS — I499 Cardiac arrhythmia, unspecified: Secondary | ICD-10-CM

## 2013-01-20 DIAGNOSIS — I4892 Unspecified atrial flutter: Secondary | ICD-10-CM

## 2013-01-20 LAB — POCT INR: INR: 2.8

## 2013-01-27 ENCOUNTER — Other Ambulatory Visit (HOSPITAL_COMMUNITY): Payer: Self-pay

## 2013-01-30 ENCOUNTER — Encounter (HOSPITAL_COMMUNITY)
Admission: RE | Admit: 2013-01-30 | Discharge: 2013-01-30 | Disposition: A | Discharge status: Home / Self Care | Payer: Medicare Other | Source: Ambulatory Visit | Attending: Nephrology | Admitting: Nephrology

## 2013-01-30 DIAGNOSIS — I5022 Chronic systolic (congestive) heart failure: Secondary | ICD-10-CM | POA: Insufficient documentation

## 2013-01-30 DIAGNOSIS — I059 Rheumatic mitral valve disease, unspecified: Secondary | ICD-10-CM | POA: Insufficient documentation

## 2013-01-30 LAB — IRON AND TIBC
Saturation Ratios: 18 % — ABNORMAL LOW (ref 20–55)
UIBC: 188 ug/dL (ref 125–400)

## 2013-01-30 MED ORDER — EPOETIN ALFA 20000 UNIT/ML IJ SOLN
INTRAMUSCULAR | Status: AC
  Filled 2013-01-30: qty 1

## 2013-01-30 MED ORDER — EPOETIN ALFA 10000 UNIT/ML IJ SOLN
20000.0000 [IU] | INTRAMUSCULAR | Status: DC
  Administered 2013-01-30: 20000 [IU] via SUBCUTANEOUS

## 2013-02-27 ENCOUNTER — Encounter (HOSPITAL_COMMUNITY)
Admission: RE | Admit: 2013-02-27 | Discharge: 2013-02-27 | Disposition: A | Discharge status: Home / Self Care | Payer: Medicare Other | Source: Ambulatory Visit | Attending: Nephrology | Admitting: Nephrology

## 2013-02-27 DIAGNOSIS — I059 Rheumatic mitral valve disease, unspecified: Secondary | ICD-10-CM | POA: Insufficient documentation

## 2013-02-27 DIAGNOSIS — I5022 Chronic systolic (congestive) heart failure: Secondary | ICD-10-CM | POA: Insufficient documentation

## 2013-02-27 LAB — RENAL FUNCTION PANEL
CO2: 27 mEq/L (ref 19–32)
Chloride: 103 mEq/L (ref 96–112)
GFR calc Af Amer: 56 mL/min — ABNORMAL LOW (ref >90)
GFR calc non Af Amer: 48 mL/min — ABNORMAL LOW (ref >90)
Glucose, Bld: 117 mg/dL — ABNORMAL HIGH (ref 70–99)
Potassium: 3.7 mEq/L (ref 3.5–5.1)
Sodium: 141 mEq/L (ref 135–145)

## 2013-02-27 LAB — POCT HEMOGLOBIN-HEMACUE: Hemoglobin: 10.8 g/dL — ABNORMAL LOW (ref 13.0–17.0)

## 2013-02-27 MED ORDER — EPOETIN ALFA 10000 UNIT/ML IJ SOLN: 20000.0000 [IU] | INTRAMUSCULAR | Status: DC

## 2013-02-27 MED ORDER — EPOETIN ALFA 20000 UNIT/ML IJ SOLN
INTRAMUSCULAR | Status: AC
  Administered 2013-02-27: 20000 [IU] via SUBCUTANEOUS
  Filled 2013-02-27: qty 1

## 2013-03-15 ENCOUNTER — Other Ambulatory Visit (HOSPITAL_COMMUNITY): Payer: Self-pay | Admitting: Internal Medicine

## 2013-03-27 ENCOUNTER — Encounter (HOSPITAL_COMMUNITY)
Admission: RE | Admit: 2013-03-27 | Discharge: 2013-03-27 | Disposition: A | Discharge status: Home / Self Care | Payer: Medicare Other | Source: Ambulatory Visit | Attending: Nephrology | Admitting: Nephrology

## 2013-03-27 DIAGNOSIS — I059 Rheumatic mitral valve disease, unspecified: Secondary | ICD-10-CM | POA: Insufficient documentation

## 2013-03-27 DIAGNOSIS — I5022 Chronic systolic (congestive) heart failure: Secondary | ICD-10-CM | POA: Insufficient documentation

## 2013-03-27 LAB — RENAL FUNCTION PANEL
Albumin: 4.4 g/dL (ref 3.5–5.2)
BUN: 16 mg/dL (ref 6–23)
Chloride: 102 mEq/L (ref 96–112)
GFR calc non Af Amer: 46 mL/min — ABNORMAL LOW (ref >90)
Phosphorus: 2.8 mg/dL (ref 2.3–4.6)
Potassium: 3.5 mEq/L (ref 3.5–5.1)

## 2013-03-27 LAB — IRON AND TIBC
Iron: 46 ug/dL (ref 42–135)
TIBC: 250 ug/dL (ref 215–435)

## 2013-03-27 LAB — POCT HEMOGLOBIN-HEMACUE: Hemoglobin: 11 g/dL — ABNORMAL LOW (ref 13.0–17.0)

## 2013-03-27 LAB — FERRITIN: Ferritin: 249 ng/mL (ref 22–322)

## 2013-03-27 MED ORDER — EPOETIN ALFA 10000 UNIT/ML IJ SOLN: 20000.0000 [IU] | INTRAMUSCULAR | Status: DC

## 2013-03-27 MED ORDER — EPOETIN ALFA 20000 UNIT/ML IJ SOLN
INTRAMUSCULAR | Status: AC
  Administered 2013-03-27: 20000 [IU] via SUBCUTANEOUS
  Filled 2013-03-27: qty 1

## 2013-04-21 ENCOUNTER — Other Ambulatory Visit (HOSPITAL_COMMUNITY): Payer: Self-pay

## 2013-04-24 ENCOUNTER — Encounter (HOSPITAL_COMMUNITY)
Admission: RE | Admit: 2013-04-24 | Discharge: 2013-04-24 | Disposition: A | Discharge status: Home / Self Care | Payer: Medicare Other | Source: Ambulatory Visit | Attending: Nephrology | Admitting: Nephrology

## 2013-04-24 DIAGNOSIS — I059 Rheumatic mitral valve disease, unspecified: Secondary | ICD-10-CM | POA: Insufficient documentation

## 2013-04-24 DIAGNOSIS — I5022 Chronic systolic (congestive) heart failure: Secondary | ICD-10-CM | POA: Insufficient documentation

## 2013-04-24 LAB — RENAL FUNCTION PANEL
Albumin: 4.3 g/dL (ref 3.5–5.2)
BUN: 18 mg/dL (ref 6–23)
CO2: 23 mEq/L (ref 19–32)
Chloride: 104 mEq/L (ref 96–112)
Creatinine, Ser: 1.58 mg/dL — ABNORMAL HIGH (ref 0.50–1.35)
GFR calc non Af Amer: 43 mL/min — ABNORMAL LOW (ref >90)
Potassium: 3.9 mEq/L (ref 3.5–5.1)

## 2013-04-24 LAB — POCT HEMOGLOBIN-HEMACUE: Hemoglobin: 12.1 g/dL — ABNORMAL LOW (ref 13.0–17.0)

## 2013-04-24 MED ORDER — SODIUM CHLORIDE 0.9 % IV SOLN
1020.0000 mg | Freq: Once | INTRAVENOUS | Status: AC
  Administered 2013-04-24: 1020 mg via INTRAVENOUS
  Filled 2013-04-24: qty 34

## 2013-04-24 MED ORDER — EPOETIN ALFA 10000 UNIT/ML IJ SOLN: 20000.0000 [IU] | INTRAMUSCULAR | Status: DC

## 2013-04-24 MED ORDER — SODIUM CHLORIDE 0.9 % IV SOLN: INTRAVENOUS | Status: DC

## 2013-05-02 ENCOUNTER — Other Ambulatory Visit: Payer: Self-pay

## 2013-05-02 MED ORDER — CARVEDILOL 6.25 MG PO TABS: 12.5000 mg | ORAL_TABLET | Freq: Every day | ORAL | Status: DC

## 2013-05-05 ENCOUNTER — Other Ambulatory Visit (HOSPITAL_COMMUNITY): Payer: Self-pay | Admitting: *Deleted

## 2013-05-08 ENCOUNTER — Encounter (HOSPITAL_COMMUNITY): Payer: Medicare Other

## 2013-06-05 ENCOUNTER — Other Ambulatory Visit (HOSPITAL_COMMUNITY): Payer: Self-pay | Admitting: Adult Health

## 2013-07-20 ENCOUNTER — Other Ambulatory Visit (HOSPITAL_COMMUNITY): Payer: Self-pay | Admitting: *Deleted

## 2013-07-21 ENCOUNTER — Encounter (HOSPITAL_COMMUNITY)
Admission: RE | Admit: 2013-07-21 | Discharge: 2013-07-21 | Disposition: A | Discharge status: Home / Self Care | Payer: Medicare Other | Source: Ambulatory Visit | Attending: Nephrology | Admitting: Nephrology

## 2013-07-21 DIAGNOSIS — N183 Chronic kidney disease, stage 3 unspecified: Secondary | ICD-10-CM | POA: Insufficient documentation

## 2013-07-21 DIAGNOSIS — D638 Anemia in other chronic diseases classified elsewhere: Secondary | ICD-10-CM | POA: Insufficient documentation

## 2013-07-21 LAB — RENAL FUNCTION PANEL
Albumin: 4.1 g/dL (ref 3.5–5.2)
BUN: 14 mg/dL (ref 6–23)
CHLORIDE: 103 meq/L (ref 96–112)
CO2: 25 meq/L (ref 19–32)
Calcium: 9 mg/dL (ref 8.4–10.5)
Creatinine, Ser: 1.56 mg/dL — ABNORMAL HIGH (ref 0.50–1.35)
GFR calc non Af Amer: 44 mL/min — ABNORMAL LOW (ref >90)
GFR, EST AFRICAN AMERICAN: 51 mL/min — AB (ref >90)
GLUCOSE: 92 mg/dL (ref 70–99)
POTASSIUM: 3.9 meq/L (ref 3.7–5.3)
Phosphorus: 2.9 mg/dL (ref 2.3–4.6)
SODIUM: 141 meq/L (ref 137–147)

## 2013-07-21 LAB — IRON AND TIBC
IRON: 54 ug/dL (ref 42–135)
SATURATION RATIOS: 24 % (ref 20–55)
TIBC: 223 ug/dL (ref 215–435)
UIBC: 169 ug/dL (ref 125–400)

## 2013-07-21 LAB — POCT HEMOGLOBIN-HEMACUE: Hemoglobin: 11 g/dL — ABNORMAL LOW (ref 13.0–17.0)

## 2013-07-21 LAB — FERRITIN: FERRITIN: 494 ng/mL — AB (ref 22–322)

## 2013-07-21 MED ORDER — EPOETIN ALFA 10000 UNIT/ML IJ SOLN: 20000.0000 [IU] | INTRAMUSCULAR | Status: DC

## 2013-07-21 MED ORDER — EPOETIN ALFA 20000 UNIT/ML IJ SOLN
INTRAMUSCULAR | Status: AC
  Administered 2013-07-21: 20000 [IU] via SUBCUTANEOUS
  Filled 2013-07-21: qty 1

## 2013-08-18 ENCOUNTER — Encounter (HOSPITAL_COMMUNITY): Payer: Medicare Other

## 2013-09-27 ENCOUNTER — Encounter (HOSPITAL_COMMUNITY): Payer: Medicare Other

## 2013-10-11 ENCOUNTER — Other Ambulatory Visit: Payer: Self-pay

## 2013-10-11 MED ORDER — CARVEDILOL 6.25 MG PO TABS: 12.5000 mg | ORAL_TABLET | Freq: Every day | ORAL | Status: DC

## 2013-11-10 ENCOUNTER — Other Ambulatory Visit (HOSPITAL_COMMUNITY): Payer: Self-pay | Admitting: Adult Health

## 2013-12-06 ENCOUNTER — Encounter (HOSPITAL_COMMUNITY): Payer: Medicare Other

## 2013-12-14 ENCOUNTER — Encounter: Payer: Self-pay | Admitting: *Deleted

## 2013-12-21 ENCOUNTER — Telehealth (HOSPITAL_COMMUNITY): Payer: Self-pay | Admitting: Cardiology

## 2013-12-21 ENCOUNTER — Encounter (HOSPITAL_COMMUNITY): Payer: Self-pay | Admitting: Cardiology

## 2013-12-21 NOTE — Telephone Encounter (Signed)
Attempting to schedule yearly follow up I have been unable to reach this patient by phone.  A letter is being sent to the last known home address.

## 2014-01-17 ENCOUNTER — Encounter (HOSPITAL_COMMUNITY): Payer: Self-pay

## 2014-01-17 ENCOUNTER — Ambulatory Visit (HOSPITAL_COMMUNITY)
Admission: RE | Admit: 2014-01-17 | Discharge: 2014-01-17 | Disposition: A | Discharge status: Home / Self Care | Payer: Medicare Other | Source: Ambulatory Visit | Attending: Cardiology | Admitting: Cardiology

## 2014-01-17 ENCOUNTER — Telehealth: Payer: Self-pay | Admitting: Pharmacist

## 2014-01-17 VITALS — BP 106/60 | HR 86 | Wt 180.1 lb

## 2014-01-17 DIAGNOSIS — I10 Essential (primary) hypertension: Secondary | ICD-10-CM | POA: Diagnosis not present

## 2014-01-17 DIAGNOSIS — Z79899 Other long term (current) drug therapy: Secondary | ICD-10-CM | POA: Insufficient documentation

## 2014-01-17 DIAGNOSIS — Z954 Presence of other heart-valve replacement: Secondary | ICD-10-CM | POA: Diagnosis not present

## 2014-01-17 DIAGNOSIS — K219 Gastro-esophageal reflux disease without esophagitis: Secondary | ICD-10-CM | POA: Insufficient documentation

## 2014-01-17 DIAGNOSIS — Z950 Presence of cardiac pacemaker: Secondary | ICD-10-CM | POA: Diagnosis not present

## 2014-01-17 DIAGNOSIS — I428 Other cardiomyopathies: Secondary | ICD-10-CM | POA: Insufficient documentation

## 2014-01-17 DIAGNOSIS — Z7901 Long term (current) use of anticoagulants: Secondary | ICD-10-CM | POA: Diagnosis not present

## 2014-01-17 DIAGNOSIS — I5022 Chronic systolic (congestive) heart failure: Secondary | ICD-10-CM

## 2014-01-17 DIAGNOSIS — I509 Heart failure, unspecified: Secondary | ICD-10-CM | POA: Diagnosis not present

## 2014-01-17 DIAGNOSIS — I4892 Unspecified atrial flutter: Secondary | ICD-10-CM

## 2014-01-17 DIAGNOSIS — I059 Rheumatic mitral valve disease, unspecified: Secondary | ICD-10-CM

## 2014-01-17 LAB — BASIC METABOLIC PANEL
Anion gap: 14 (ref 5–15)
BUN: 13 mg/dL (ref 6–23)
CO2: 26 mEq/L (ref 19–32)
CREATININE: 1.27 mg/dL (ref 0.50–1.35)
Calcium: 8.9 mg/dL (ref 8.4–10.5)
Chloride: 101 mEq/L (ref 96–112)
GFR, EST AFRICAN AMERICAN: 64 mL/min — AB (ref >90)
GFR, EST NON AFRICAN AMERICAN: 56 mL/min — AB (ref >90)
GLUCOSE: 89 mg/dL (ref 70–99)
Potassium: 3.8 mEq/L (ref 3.7–5.3)
Sodium: 141 mEq/L (ref 137–147)

## 2014-01-17 LAB — CBC
HCT: 33.9 % — ABNORMAL LOW (ref 39.0–52.0)
Hemoglobin: 10.8 g/dL — ABNORMAL LOW (ref 13.0–17.0)
MCH: 27.3 pg (ref 26.0–34.0)
MCHC: 31.9 g/dL (ref 30.0–36.0)
MCV: 85.6 fL (ref 78.0–100.0)
PLATELETS: 308 10*3/uL (ref 150–400)
RBC: 3.96 MIL/uL — AB (ref 4.22–5.81)
RDW: 15.9 % — ABNORMAL HIGH (ref 11.5–15.5)
WBC: 4.4 10*3/uL (ref 4.0–10.5)

## 2014-01-17 LAB — PRO B NATRIURETIC PEPTIDE: PRO B NATRI PEPTIDE: 378.6 pg/mL — AB (ref 0–125)

## 2014-01-17 MED ORDER — CARVEDILOL 6.25 MG PO TABS: 6.2500 mg | ORAL_TABLET | Freq: Two times a day (BID) | ORAL | Status: DC

## 2014-01-17 MED ORDER — ASPIRIN EC 81 MG PO TBEC: 81.0000 mg | DELAYED_RELEASE_TABLET | Freq: Every day | ORAL | Status: DC

## 2014-01-17 NOTE — Progress Notes (Signed)
Patient ID: [name], male   DOB: 1946/06/10, 68 y.o.   MRN: [id number]  Primary Cardiologist: Dr. Gala RomneyBensimhon PCP: N/A  HPI: [name] is a 68 year old male with h/o polysubstance abuse c/b MV endocarditis s/p St. Jude MVR, CHF due to NICM and PAF.  Underwent CRT-P implant for bradycardia and fatigue.   TTE in 2010 showed normal EF with elevated gradient across MV (mean 14) with perivalvular leak. Follow-up TEE in June 2010 with EF 40-45% mild stenosis mean gradient = 8. No vegetation or abscess.   Echo 8/12 EF 35% mild perivalvular MR.  Echo 3/13 EF 50% mild MVR stable with mild peri-valvular leak.  ECHO 12/16/2012 EF 50% stable MVR  Follow up for Heart failure: Feeling ok. Some days he is fatigued, but most days ok. Denies SOB, orthopnea, PND or CP. Has not had coumadin checked in over a year. Riding his bicycle daily about 1/2 mile. Taking medications as prescribed. Has not needed any lasix. Following a low salt diet and drinking less than 2L a day. Occasional memory loss.  Labs (1/15): K 3.9, creatinine 1.56, hemoglobin 11  ECG: a-sensed, BiV paced  SH: Lives with his daughter's mom. No ETOH, tobacco abuse or drugs.  FH: Mom deceased: no cardiac issues        Father deceased: no cardiac issues.   ROS: All systems negative except as listed in HPI, PMH and Problem List.  Past Medical History  Diagnosis Date  . 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  a- Echo 07/2008 Ef 55% mild peroprosthetic MVR with High transmitral gradient (mean 14).  b- normal coronaries by cath 12/2007  . Atrial fibrillation     post op.  s/p dc-cv 04/2008. previously on amiodarone  . Atrial flutter     atypical  . Heavy alcohol use     history  . IV drug abuse     history of  . CHF (congestive heart failure)     EF 35-40 % 2011 due to valvular disease and diastolic dysfunction  . History of GI bleed   . Hypertension   . GERD (gastroesophageal reflux disease)    . Chronic back pain   . AV block, 1st degree     .450 msec--progressive  . Dysphagia     with normal barium swallow 09/2008  . Pacemaker 2010    Current Outpatient Prescriptions  Medication Sig Dispense Refill  . carvedilol (COREG) 6.25 MG tablet Take 2 tablets (12.5 mg total) by mouth daily.  60 tablet  3  . furosemide (LASIX) 20 MG tablet TAKE 1 TABLET (20 MG TOTAL) BY MOUTH AS NEEDED.  30 tablet  3  . KLOR-CON M20 20 MEQ tablet Take 1 tablet by mouth Daily.      Marland Kitchen. lisinopril (PRINIVIL,ZESTRIL) 20 MG tablet Take 20 mg by mouth daily.      Marland Kitchen. loratadine (CLARITIN) 10 MG tablet Take 10 mg by mouth daily.      Marland Kitchen. omeprazole (PRILOSEC) 20 MG capsule Take 20 mg by mouth daily.        . ranitidine (ZANTAC) 150 MG tablet TAKE 1 TABLET TWICE DAILY AS NEEDED FOR HEARTBURN  60 tablet  3  . traMADol (ULTRAM) 50 MG tablet Take 50 mg by mouth every 8 (eight) hours as needed for pain (as needed  for 30 days).      . warfarin (COUMADIN) 5 MG tablet Take as directed by anticoagulation clinic  60 tablet  3   No current facility-administered medications for this encounter.    Filed Vitals:   01/17/14 1148  BP: 106/60  Pulse: 86    PHYSICAL EXAM: Gen: well appearing. no resp difficulty HEENT: normal x for edentulous Neck: supple. no JVD. Carotids 2+ bilat; no bruits. No  Cor: PMI nondisplaced. Regular. No rubs, gallops. mechanical s1. No murmur. Lungs: Slight crackles at the bases bilaterally.  Abdomen: soft, nontender, nondistended. No hepatosplenomegaly. No bruits or masses. Good bowel sounds. Extremities: no cyanosis, clubbing, rash, 1+ bilateral ankle edema.  Neuro: alert & orientedx3, cranial nerves grossly intact. moves all 4 extremities w/o difficulty. affect pleasant  ASSESSMENT & PLAN: 1. Chronic systolic HF: Nonischemic cardiomyopathy s/p CRT-D.  EF recovered to 45-50% on last echo (12/2012).  On exam today, he does not appear volume overloaded. NYHA class II symptoms.   - Continue  prn Lasix.  - Continue Coreg but take it 6.25 mg bid rather than 12.5 mg once daily.  - Continue lisinopril at current dose.  - Check BMET, BNP today.  - Arrange followup for BiV-ICD with EP.  2. Mechanical mitral valve: Well-seated on 6/14 echo.  Unfortunately, he has gone an extended period without INR followup for coumadin. I am going to set him back up with the coumadin clinic and told him that it is imperative that he is compliant with INR checks.  Given mechanical valve and no history of bleeding, I will add ASA 81 mg daily. Check CBC today.   Marca Ancona 01/17/2014

## 2014-01-17 NOTE — Telephone Encounter (Signed)
Patient states that no one has been managing his coumadin, but that he still has pills, and has been taking it daily.  He states he takes 1 tablet of warfarin most days and takes 1.5 tablets a few days of the week.  He agrees to come in tomorrow for PT/INR.

## 2014-01-17 NOTE — Patient Instructions (Addendum)
Start Aspirin 81 mg daily  Start taking your coreg 6.25 mg (1 tablet) in the morning and 6.25 mg (1 tablet) in the evening.   Bring all your medications to your next visit.  Need to go get Coumadin checked regularly.  Will set up apt for ICD check.   F/U 4 months  Do the following things EVERYDAY: 1) Weigh yourself in the morning before breakfast. Write it down and keep it in a log. 2) Take your medicines as prescribed 3) Eat low salt foods-Limit salt (sodium) to 2000 mg per day.  4) Stay as active as you can everyday 5) Limit all fluids for the day to less than 2 liters 6)

## 2014-01-17 NOTE — Telephone Encounter (Signed)
Message copied by Lou Miner on Wed Jan 17, 2014 12:48 PM ------      Message from: Noralee Space      Created: Wed Jan 17, 2014 12:23 PM       Hey so this guy has not seen you guys in over a year it looks like, can you please call him and get him scheduled with you, he is aware you will be calling, thanks ------

## 2014-01-18 ENCOUNTER — Ambulatory Visit (INDEPENDENT_AMBULATORY_CARE_PROVIDER_SITE_OTHER): Payer: Medicare Other | Admitting: *Deleted

## 2014-01-18 DIAGNOSIS — I499 Cardiac arrhythmia, unspecified: Secondary | ICD-10-CM

## 2014-01-18 DIAGNOSIS — Z7901 Long term (current) use of anticoagulants: Secondary | ICD-10-CM

## 2014-01-18 LAB — POCT INR: INR: 3

## 2014-01-30 ENCOUNTER — Encounter: Payer: Self-pay | Admitting: Internal Medicine

## 2014-01-30 ENCOUNTER — Ambulatory Visit (INDEPENDENT_AMBULATORY_CARE_PROVIDER_SITE_OTHER): Payer: Medicare Other | Admitting: Internal Medicine

## 2014-01-30 VITALS — BP 112/66 | HR 90 | Ht 68.0 in | Wt 178.0 lb

## 2014-01-30 DIAGNOSIS — I5022 Chronic systolic (congestive) heart failure: Secondary | ICD-10-CM

## 2014-01-30 DIAGNOSIS — Z95810 Presence of automatic (implantable) cardiac defibrillator: Secondary | ICD-10-CM

## 2014-01-30 DIAGNOSIS — I429 Cardiomyopathy, unspecified: Secondary | ICD-10-CM

## 2014-01-30 DIAGNOSIS — I440 Atrioventricular block, first degree: Secondary | ICD-10-CM

## 2014-01-30 LAB — MDC_IDC_ENUM_SESS_TYPE_INCLINIC
Battery Voltage: 2.92 V
Implantable Pulse Generator Model: 3210
Lead Channel Impedance Value: 375 Ohm
Lead Channel Impedance Value: 675 Ohm
Lead Channel Pacing Threshold Amplitude: 0.625 V
Lead Channel Pacing Threshold Amplitude: 0.75 V
Lead Channel Pacing Threshold Amplitude: 0.75 V
Lead Channel Pacing Threshold Amplitude: 0.875 V
Lead Channel Pacing Threshold Pulse Width: 0.4 ms
Lead Channel Pacing Threshold Pulse Width: 0.4 ms
Lead Channel Pacing Threshold Pulse Width: 1 ms
Lead Channel Sensing Intrinsic Amplitude: 5 mV
Lead Channel Sensing Intrinsic Amplitude: 9.5 mV
Lead Channel Setting Pacing Amplitude: 2 V
Lead Channel Setting Pacing Amplitude: 2 V
Lead Channel Setting Pacing Pulse Width: 0.4 ms
Lead Channel Setting Sensing Sensitivity: 2 mV
MDC IDC MSMT BATTERY REMAINING LONGEVITY: 66 mo
MDC IDC MSMT LEADCHNL RA PACING THRESHOLD PULSEWIDTH: 0.4 ms
MDC IDC MSMT LEADCHNL RV IMPEDANCE VALUE: 462.5 Ohm
MDC IDC PG SERIAL: 2665541
MDC IDC SESS DTM: 20150728104847
MDC IDC SET LEADCHNL LV PACING PULSEWIDTH: 1 ms
MDC IDC SET LEADCHNL RA PACING AMPLITUDE: 2 V
MDC IDC STAT BRADY RA PERCENT PACED: 0.27 %
MDC IDC STAT BRADY RV PERCENT PACED: 94 %

## 2014-01-30 NOTE — Patient Instructions (Signed)

## 2014-01-30 NOTE — Progress Notes (Signed)
Patient Care Team: Radford Pax, NP as PCP - General (Family Medicine)   HPI  [name] is a 68 y.o. male Seen in followup for CRT-P  Implanted September 2012 for profound first degree AV block in the setting of a modest valvular cardiomyopathy ejection fraction 35-40%.    He is s/p MVR for endocarditis associated with IVDA. Most recent echo 6/14 demonstrated ejection fraction 50% and stable mitral valve replacement   There has been a challenge was compliance of anticoagulation   Past Medical History  Diagnosis Date  . 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  a- Echo 07/2008 Ef 55% mild peroprosthetic MVR with High transmitral gradient (mean 14).  b- normal coronaries by cath 12/2007  . Atrial fibrillation     post op.  s/p dc-cv 04/2008. previously on amiodarone  . Atrial flutter     atypical  . Heavy alcohol use     history  . IV drug abuse     history of  . CHF (congestive heart failure)     EF 35-40 % 2011 due to valvular disease and diastolic dysfunction  . History of GI bleed   . Hypertension   . GERD (gastroesophageal reflux disease)   . Chronic back pain   . AV block, 1st degree     .450 msec--progressive  . Dysphagia     with normal barium swallow 09/2008  . Pacemaker 2010    No past surgical history on file.  Current Outpatient Prescriptions  Medication Sig Dispense Refill  . aspirin EC 81 MG tablet Take 1 tablet (81 mg total) by mouth daily.  30 tablet  3  . carvedilol (COREG) 6.25 MG tablet Take 1 tablet (6.25 mg total) by mouth 2 (two) times daily with a meal.  60 tablet  6  . furosemide (LASIX) 20 MG tablet TAKE 1 TABLET (20 MG TOTAL) BY MOUTH AS NEEDED.  30 tablet  3  . KLOR-CON M20 20 MEQ tablet Take 1 tablet by mouth Daily.      Marland Kitchen lisinopril (PRINIVIL,ZESTRIL) 20 MG tablet Take 20 mg by mouth daily.      Marland Kitchen loratadine (CLARITIN) 10 MG tablet Take 10 mg by mouth daily.      Marland Kitchen omeprazole (PRILOSEC) 20  MG capsule Take 20 mg by mouth daily.        . ranitidine (ZANTAC) 150 MG tablet TAKE 1 TABLET TWICE DAILY AS NEEDED FOR HEARTBURN  60 tablet  3  . traMADol (ULTRAM) 50 MG tablet Take 50 mg by mouth every 8 (eight) hours as needed for pain (as needed  for 30 days).      . warfarin (COUMADIN) 5 MG tablet Take as directed by anticoagulation clinic  60 tablet  3   No current facility-administered medications for this visit.    No Known Allergies  Review of Systems negative except from HPI and PMH  Physical Exam BP 112/66  Pulse 90  Ht 5\' 8"  (1.727 m)  Wt 178 lb (80.74 kg)  BMI 27.07 kg/m2 Well developed and well nourished in no acute distress HENT normal E scleral and icterus clear Neck Supple JVP flat; carotids brisk and full Clear to ausculation mechanical S1*Regular rate and rhythm, early systolic murmur gallops or rub Soft with active bowel sounds No clubbing cyanosis  Edema Alert and oriented, grossly normal motor and sensory function Skin Warm and Dry    Assessment  and  Plan  Nonischemic cardiomyopathy  Congestive heart failure chronic systolic  Pacemaker-CRT-St. Jude The patient's device was interrogated.  The information was reviewed. No changes were made in the programming.    Euvolemic continue current meds

## 2014-02-02 ENCOUNTER — Encounter: Payer: Self-pay | Admitting: Internal Medicine

## 2014-02-21 ENCOUNTER — Emergency Department (HOSPITAL_COMMUNITY): Payer: Medicare Other

## 2014-02-21 ENCOUNTER — Encounter (HOSPITAL_COMMUNITY): Payer: Self-pay | Admitting: Emergency Medicine

## 2014-02-21 ENCOUNTER — Emergency Department (HOSPITAL_COMMUNITY)
Admission: EM | Admit: 2014-02-21 | Discharge: 2014-02-21 | Disposition: A | Discharge status: Home / Self Care | Payer: Medicare Other | Attending: Emergency Medicine | Admitting: Emergency Medicine

## 2014-02-21 DIAGNOSIS — Z79899 Other long term (current) drug therapy: Secondary | ICD-10-CM | POA: Diagnosis not present

## 2014-02-21 DIAGNOSIS — I10 Essential (primary) hypertension: Secondary | ICD-10-CM | POA: Diagnosis not present

## 2014-02-21 DIAGNOSIS — K219 Gastro-esophageal reflux disease without esophagitis: Secondary | ICD-10-CM | POA: Diagnosis not present

## 2014-02-21 DIAGNOSIS — M25569 Pain in unspecified knee: Secondary | ICD-10-CM | POA: Diagnosis present

## 2014-02-21 DIAGNOSIS — Z8619 Personal history of other infectious and parasitic diseases: Secondary | ICD-10-CM | POA: Diagnosis not present

## 2014-02-21 DIAGNOSIS — Z87891 Personal history of nicotine dependence: Secondary | ICD-10-CM | POA: Diagnosis not present

## 2014-02-21 DIAGNOSIS — I4891 Unspecified atrial fibrillation: Secondary | ICD-10-CM | POA: Insufficient documentation

## 2014-02-21 DIAGNOSIS — Z7982 Long term (current) use of aspirin: Secondary | ICD-10-CM | POA: Diagnosis not present

## 2014-02-21 DIAGNOSIS — Z950 Presence of cardiac pacemaker: Secondary | ICD-10-CM | POA: Diagnosis not present

## 2014-02-21 DIAGNOSIS — G8929 Other chronic pain: Secondary | ICD-10-CM | POA: Insufficient documentation

## 2014-02-21 DIAGNOSIS — I509 Heart failure, unspecified: Secondary | ICD-10-CM | POA: Diagnosis not present

## 2014-02-21 DIAGNOSIS — Z7901 Long term (current) use of anticoagulants: Secondary | ICD-10-CM | POA: Insufficient documentation

## 2014-02-21 DIAGNOSIS — I4892 Unspecified atrial flutter: Secondary | ICD-10-CM | POA: Diagnosis not present

## 2014-02-21 DIAGNOSIS — M25069 Hemarthrosis, unspecified knee: Secondary | ICD-10-CM | POA: Diagnosis not present

## 2014-02-21 DIAGNOSIS — M25062 Hemarthrosis, left knee: Secondary | ICD-10-CM

## 2014-02-21 LAB — SYNOVIAL CELL COUNT + DIFF, W/ CRYSTALS
Crystals, Fluid: NONE SEEN
EOSINOPHILS-SYNOVIAL: 1 % (ref 0–1)
LYMPHOCYTES-SYNOVIAL FLD: 21 % — AB (ref 0–20)
MONOCYTE-MACROPHAGE-SYNOVIAL FLUID: 67 % (ref 50–90)
NEUTROPHIL, SYNOVIAL: 11 % (ref 0–25)
WBC, SYNOVIAL: 4347 /mm3 — AB (ref 0–200)

## 2014-02-21 LAB — GRAM STAIN

## 2014-02-21 LAB — PROTIME-INR
INR: 3.09 — ABNORMAL HIGH (ref 0.00–1.49)
Prothrombin Time: 31.9 seconds — ABNORMAL HIGH (ref 11.6–15.2)

## 2014-02-21 NOTE — ED Notes (Signed)
Pt c/o left knee pain and some swelling and "thinks there is fluid on his knee" ; pt sts hx of same and denies injury

## 2014-02-21 NOTE — ED Notes (Signed)
TC to lab for gram stain

## 2014-02-21 NOTE — ED Provider Notes (Signed)
CSN: [id number]     Arrival date & time 02/21/14  1242 History  This chart was scribed for Marthann Schiller, PA-C, working with [employer]. Rubin Payor, MD by Chestine Spore, ED Scribe. The patient was seen in room TR08C/TR08C at 1:29 PM.     Chief Complaint  Patient presents with  . Knee Pain     The history is provided by the patient. No language interpreter was used.   HPI Comments: [name] is a 68 y.o. male who presents to the Emergency Department complaining of atraumatic left knee pain and swelling onset 3 days ago. He states that it is painful to stand and walk on the knee. Pain is minimal when he is not weightbearing. He states that he has had fluid on this knee before, which was alleviated by arthrocentesis. He rates the pain as mild. He states that he is not taking any pain medications.  He denies any other associated symptoms. He denies being allergic to any medications. Denies fever, warmth, reduced range of motion, numbness or paresthesia. History of IV drug abuse. Anticoagulated with Coumadin on warfarin. Patient had his last INR check approximately one month ago which is a typical schedule for him.   Past Medical History  Diagnosis Date  . 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  a- Echo 07/2008 Ef 55% mild peroprosthetic MVR with High transmitral gradient (mean 14).  b- normal coronaries by cath 12/2007  . Atrial fibrillation     post op.  s/p dc-cv 04/2008. previously on amiodarone  . Atrial flutter     atypical  . Heavy alcohol use     history  . IV drug abuse     history of  . CHF (congestive heart failure)     EF 35-40 % 2011 due to valvular disease and diastolic dysfunction  . History of GI bleed   . Hypertension   . GERD (gastroesophageal reflux disease)   . Chronic back pain   . AV block, 1st degree     .450 msec--progressive  . Dysphagia     with normal barium swallow 09/2008  . Pacemaker 2010   History reviewed. No  pertinent past surgical history. Family History  Problem Relation Age of Onset  . Coronary artery disease Mother    History  Substance Use Topics  . Smoking status: Former Smoker -- 10 years    Types: Cigarettes  . Smokeless tobacco: Never Used     Comment: ONLY 3 PER DAY AFTER MEALS  . Alcohol Use: No     Comment: quit drinking 2 years ago    Review of Systems  A complete 10 system review of systems was obtained and all systems are negative except as noted in the HPI and PMH.    Allergies  Review of patient's allergies indicates no known allergies.  Home Medications   Prior to Admission medications   Medication Sig Start Date End Date Taking? Authorizing Provider  aspirin EC 81 MG tablet Take 1 tablet (81 mg total) by mouth daily. 01/17/14  Yes Aundria Rud, NP  carvedilol (COREG) 6.25 MG tablet Take 1 tablet (6.25 mg total) by mouth 2 (two) times daily with a meal. 01/17/14  Yes Aundria Rud, NP  furosemide (LASIX) 20 MG tablet Take 20 mg by mouth daily.   Yes Historical Provider, MD  KLOR-CON M20 20 MEQ tablet Take 1 tablet by mouth Daily. 12/15/10  Yes Historical Provider,  MD  lisinopril (PRINIVIL,ZESTRIL) 20 MG tablet Take 20 mg by mouth daily. 01/10/13  Yes [name], MD  omeprazole (PRILOSEC) 20 MG capsule Take 20 mg by mouth daily.     Yes Historical Provider, MD  traMADol (ULTRAM) 50 MG tablet Take 50 mg by mouth every 8 (eight) hours as needed for pain (as needed  for 30 days). 10/27/12  Yes Historical Provider, MD  warfarin (COUMADIN) 5 MG tablet Take 5-7.5 mg by mouth daily. Take 5 mg daily except take 7.5 mg on sun, tue, and thur   Yes Historical Provider, MD   BP 100/66  Pulse 88  Temp(Src) 98.1 F (36.7 C) (Oral)  Resp 18  SpO2 100%  Physical Exam  Nursing note and vitals reviewed. Constitutional: He is oriented to person, place, and time. He appears well-developed and well-nourished. No distress.  HENT:  Head: Normocephalic and atraumatic.   Mouth/Throat: Oropharynx is clear and moist.  Eyes: Conjunctivae and EOM are normal. Pupils are equal, round, and reactive to light.  Neck: Normal range of motion.  Cardiovascular: Normal rate and intact distal pulses.   Pulmonary/Chest: Effort normal and breath sounds normal. No stridor. No respiratory distress. He has no wheezes. He has no rales. He exhibits no tenderness.  Abdominal: Soft.  Musculoskeletal: Normal range of motion.  Large effusion to left knee, there is no warmth, excellent range of motion. He is distally neurovascularly intact. There are no overlying skin changes.  Neurological: He is alert and oriented to person, place, and time.  Psychiatric: He has a normal mood and affect.    ED Course  Procedures (including critical care time) DIAGNOSTIC STUDIES: Oxygen Saturation is 100% on room air, normal by my interpretation.    COORDINATION OF CARE: 1:33 PM-Discussed treatment plan which includes left knee X-Ray, Fluid removal of the left knee with pt at bedside and pt agreed to plan.   Labs Review Labs Reviewed  PROTIME-INR - Abnormal; Notable for the following:    Prothrombin Time 31.9 (*)    INR 3.09 (*)    All other components within normal limits  SYNOVIAL CELL COUNT + DIFF, W/ CRYSTALS - Abnormal; Notable for the following:    Color, Synovial RED (*)    Appearance-Synovial TURBID (*)    WBC, Synovial 4347 (*)    All other components within normal limits  BODY FLUID CULTURE    Imaging Review Dg Knee Complete 4 Views Left  02/21/2014   CLINICAL DATA:  Three day history of left knee pain and swelling  EXAM: LEFT KNEE - COMPLETE 4+ VIEW  COMPARISON:  None  FINDINGS: Large suprapatellar joint effusion. No evidence of acute fracture or malalignment. Mild tricompartmental osteoarthritis most significant in the medial compartment were there is a narrowing of the joint space. Additionally, there is a degenerative change at the proximal tibiofibular joint. Normal bony  mineralization. No lytic or blastic osseous lesion.  IMPRESSION: 1. Large suprapatellar joint effusion. Given the presence of associated osteoarthritis, this is favored to be degenerative in etiology. Active inflammation or infection is difficult to exclude radiographically. 2. Moderate tricompartmental osteoarthritis most significant in the medial compartment. 3. Degenerative change at the proximal tibiofibular joint.   Electronically Signed   By: Malachy Moan M.D.   On: 02/21/2014 14:03     EKG Interpretation None      MDM   Final diagnoses:  Hemarthrosis of knee, left    Filed Vitals:   02/21/14 1259  BP: 100/66  Pulse:  88  Temp: 98.1 F (36.7 C)  TempSrc: Oral  Resp: 18  SpO2: 100%    Allene Dillonddie T Ramus is a 68 y.o. male presenting with atraumatic large effusion to the left knee. X-ray reveals a large suprapatellar effusion. Arthrocentesis is performed by PA Merrell, with improvement of patient's pain. Synovial fluid has a significant amount of blood contents. INR is therapeutic at 3.09.   Synovial fluid analysis shows 4300 white blood cells. Given the hemarthrosis, doubt septic joint also doesn't the joint from physical exam excellent range of motion and no warmth to the joint. Gram stain shows no organisms. Case is discussed with attending Dr. Hessie DienerAlan. Does not recommend consulting or throat this time. Patient's pain is significantly improved. We'll discharged home with ortho followup.  Evaluation does not show pathology that would require ongoing emergent intervention or inpatient treatment. Pt is hemodynamically stable and mentating appropriately. Discussed findings and plan with patient/guardian, who agrees with care plan. All questions answered. Return precautions discussed and outpatient follow up given.   I personally performed the services described in this documentation, which was scribed in my presence. The recorded information has been reviewed and is accurate.     Wynetta Emeryicole Jaimen Melone, PA-C 02/21/14 1803

## 2014-02-21 NOTE — ED Provider Notes (Signed)
  Physical Exam  BP 100/66  Pulse 88  Temp(Src) 98.1 F (36.7 C) (Oral)  Resp 18  SpO2 100%  Physical Exam  Nursing note and vitals reviewed. Constitutional: He is oriented to person, place, and time. He appears well-developed and well-nourished. No distress.  HENT:  Head: Normocephalic and atraumatic.  Right Ear: External ear normal.  Left Ear: External ear normal.  Nose: Nose normal.  Mouth/Throat: Oropharynx is clear and moist.  Eyes: Conjunctivae are normal.  Neck: Normal range of motion. Neck supple.  Cardiovascular: Normal rate.   Pulmonary/Chest: Effort normal.  Neurological: He is alert and oriented to person, place, and time.  Skin: Skin is warm and dry. He is not diaphoretic.  Psychiatric: He has a normal mood and affect.    ED Course  ARTHOCENTESIS Performed by: Mora Bellman Authorized by: Mora Bellman Consent: Verbal consent obtained. written consent not obtained. The procedure was performed in an emergent situation. Risks and benefits: risks, benefits and alternatives were discussed Consent given by: patient Patient understanding: patient states understanding of the procedure being performed Patient consent: the patient's understanding of the procedure matches consent given Required items: required blood products, implants, devices, and special equipment available Patient identity confirmed: verbally with patient and arm band Time out: Immediately prior to procedure a "time out" was called to verify the correct patient, procedure, equipment, support staff and site/side marked as required. Indications: joint swelling and pain  Body area: knee Joint: left knee Local anesthesia used: yes Local anesthetic: lidocaine 2% without epinephrine Anesthetic total: 5 ml Patient sedated: no Preparation: Patient was prepped and draped in the usual sterile fashion. Needle gauge: 18 G Ultrasound guidance: no Approach: lateral Aspirate: bloody Aspirate amount:  40 ml Patient tolerance: Patient tolerated the procedure well with no immediate complications.    MDM  Patient tolerated procedure well. Patient feels improved after procedure finished. Sterile technique used. INR 3.09. Synovial fluid and culture sent.   Mora Bellman, PA-C 02/21/14 [phone]

## 2014-02-21 NOTE — Discharge Instructions (Signed)
Please follow with your primary care doctor in the next 2 days for a check-up. They must obtain records for further management.   Do not hesitate to return to the Emergency Department for any new, worsening or concerning symptoms.    Knee Arthrocentesis Arthrocentesis is a procedure for removing fluid from a joint. The procedure is used to remove uncomfortable amounts of fluid from a joint or to get a sample of joint fluid for testing. Testing joint fluid can help your health care provider figure out the cause of the pain or swelling you are having in your joint. Infection or gout, among other conditions, can cause fluid to form in joints, resulting in pain or swelling.  LET YOUR CAREGIVER KNOW ABOUT:   Allergies.  Medications taken including herbs, eye drops, over the counter medications, and creams.  Use of steroids (by mouth or creams).  Previous problems with anesthetics or Novocaine.  History of blood clots (thrombophlebitis).  History of bleeding or blood problems.  Previous surgery.  Other health problems. RISKS AND COMPLICATIONS  A local anesthetic may not numb the area well enough and you may feel some minor discomfort. In rare cases, you may have an allergic reaction to the drug used to numb the skin.  More fluid may form in the joint.  You may develop infection or bleeding. BEFORE THE PROCEDURE  Wash all of the skin around the entire knee area. Try to remove any loose, scaling skin. There is no other specific preparation necessary unless advised otherwise by your caregiver. AFTER THE PROCEDURE   You can go home after the procedure.  You may need to put ice on the joint 15-20 minutes, 03-04 times a day until pain goes away.  You may need to put an elastic bandage on the joint. HOME CARE INSTRUCTIONS   Only take over-the-counter or prescription medicines for pain, discomfort, or fever as directed by your caregiver.  You should avoid stressing the joint. Unless  advised otherwise, this includes jogging, bicycling, recreational climbing, hiking, and other activities that would put a lot of pressure on a knee joint.  Laying down and elevating the leg/knee above the level of your heart can help to minimize return of swelling. SEEK MEDICAL CARE IF:   You have repeated or worsening swelling.  There is drainage from the puncture area.  You develop red streaking that extends above or below the site where the needle had been placed. SEEK IMMEDIATE MEDICAL CARE IF:   You develop a fever.  You have pain that gets worse even though you are taking pain medicine.  The area is red and warm and you have trouble moving the joint. MAKE SURE YOU:   Understand these instructions.  Will watch your condition.  Will get help right away if you are not doing well or get worse. Document Released: 09/10/2006 Document Revised: 09/14/2011 Document Reviewed: 06/06/2007 [facility] Patient Information 2015 [place], [place]. This information is not intended to replace advice given to you by your health care provider. Make sure you discuss any questions you have with your health care provider.

## 2014-02-21 NOTE — ED Notes (Signed)
Merrell, PA at bedside to drain fluid from L knee.

## 2014-02-22 NOTE — ED Provider Notes (Signed)
Medical screening examination/treatment/procedure(s) were performed by non-physician practitioner and as supervising physician I was immediately available for consultation/collaboration.   EKG Interpretation None       Shirleyann Montero R. Reveca Desmarais, MD 02/22/14 0955 

## 2014-02-22 NOTE — ED Provider Notes (Signed)
Medical screening examination/treatment/procedure(s) were performed by non-physician practitioner and as supervising physician I was immediately available for consultation/collaboration.   EKG Interpretation None       Juliet Rude. Rubin Payor, MD 02/22/14 [phone]

## 2014-02-24 LAB — BODY FLUID CULTURE: Culture: NO GROWTH

## 2014-03-23 ENCOUNTER — Telehealth: Payer: Self-pay | Admitting: Internal Medicine

## 2014-03-23 ENCOUNTER — Inpatient Hospital Stay (HOSPITAL_COMMUNITY)
Admission: EM | Admit: 2014-03-23 | Discharge: 2014-03-30 | DRG: 357 | Disposition: A | Discharge status: Home / Self Care | Payer: Medicare Other | Attending: Internal Medicine | Admitting: Internal Medicine

## 2014-03-23 ENCOUNTER — Emergency Department (HOSPITAL_COMMUNITY): Payer: Medicare Other

## 2014-03-23 ENCOUNTER — Encounter (HOSPITAL_COMMUNITY): Payer: Self-pay | Admitting: Emergency Medicine

## 2014-03-23 ENCOUNTER — Telehealth (HOSPITAL_COMMUNITY): Payer: Self-pay | Admitting: Vascular Surgery

## 2014-03-23 DIAGNOSIS — I5022 Chronic systolic (congestive) heart failure: Secondary | ICD-10-CM | POA: Diagnosis present

## 2014-03-23 DIAGNOSIS — R51 Headache: Secondary | ICD-10-CM | POA: Diagnosis present

## 2014-03-23 DIAGNOSIS — T45515A Adverse effect of anticoagulants, initial encounter: Secondary | ICD-10-CM | POA: Diagnosis present

## 2014-03-23 DIAGNOSIS — R000 Tachycardia, unspecified: Secondary | ICD-10-CM | POA: Diagnosis present

## 2014-03-23 DIAGNOSIS — K5730 Diverticulosis of large intestine without perforation or abscess without bleeding: Secondary | ICD-10-CM | POA: Diagnosis present

## 2014-03-23 DIAGNOSIS — I129 Hypertensive chronic kidney disease with stage 1 through stage 4 chronic kidney disease, or unspecified chronic kidney disease: Secondary | ICD-10-CM | POA: Diagnosis present

## 2014-03-23 DIAGNOSIS — R1310 Dysphagia, unspecified: Secondary | ICD-10-CM

## 2014-03-23 DIAGNOSIS — Z791 Long term (current) use of non-steroidal anti-inflammatories (NSAID): Secondary | ICD-10-CM

## 2014-03-23 DIAGNOSIS — K219 Gastro-esophageal reflux disease without esophagitis: Secondary | ICD-10-CM | POA: Diagnosis present

## 2014-03-23 DIAGNOSIS — R1033 Periumbilical pain: Secondary | ICD-10-CM

## 2014-03-23 DIAGNOSIS — Z950 Presence of cardiac pacemaker: Secondary | ICD-10-CM

## 2014-03-23 DIAGNOSIS — R42 Dizziness and giddiness: Secondary | ICD-10-CM

## 2014-03-23 DIAGNOSIS — R079 Chest pain, unspecified: Secondary | ICD-10-CM | POA: Diagnosis present

## 2014-03-23 DIAGNOSIS — K5521 Angiodysplasia of colon with hemorrhage: Secondary | ICD-10-CM | POA: Diagnosis present

## 2014-03-23 DIAGNOSIS — N183 Chronic kidney disease, stage 3 unspecified: Secondary | ICD-10-CM | POA: Diagnosis present

## 2014-03-23 DIAGNOSIS — K922 Gastrointestinal hemorrhage, unspecified: Secondary | ICD-10-CM | POA: Diagnosis present

## 2014-03-23 DIAGNOSIS — Z954 Presence of other heart-valve replacement: Secondary | ICD-10-CM | POA: Diagnosis not present

## 2014-03-23 DIAGNOSIS — D649 Anemia, unspecified: Secondary | ICD-10-CM

## 2014-03-23 DIAGNOSIS — I499 Cardiac arrhythmia, unspecified: Secondary | ICD-10-CM

## 2014-03-23 DIAGNOSIS — Z8774 Personal history of (corrected) congenital malformations of heart and circulatory system: Secondary | ICD-10-CM

## 2014-03-23 DIAGNOSIS — I10 Essential (primary) hypertension: Secondary | ICD-10-CM | POA: Diagnosis present

## 2014-03-23 DIAGNOSIS — R0602 Shortness of breath: Secondary | ICD-10-CM | POA: Diagnosis present

## 2014-03-23 DIAGNOSIS — Z7982 Long term (current) use of aspirin: Secondary | ICD-10-CM

## 2014-03-23 DIAGNOSIS — K5520 Angiodysplasia of colon without hemorrhage: Secondary | ICD-10-CM

## 2014-03-23 DIAGNOSIS — R1013 Epigastric pain: Secondary | ICD-10-CM

## 2014-03-23 DIAGNOSIS — K3189 Other diseases of stomach and duodenum: Secondary | ICD-10-CM | POA: Diagnosis present

## 2014-03-23 DIAGNOSIS — Z860100 Personal history of colon polyps, unspecified: Secondary | ICD-10-CM

## 2014-03-23 DIAGNOSIS — I509 Heart failure, unspecified: Secondary | ICD-10-CM | POA: Diagnosis present

## 2014-03-23 DIAGNOSIS — Z87891 Personal history of nicotine dependence: Secondary | ICD-10-CM | POA: Diagnosis not present

## 2014-03-23 DIAGNOSIS — D62 Acute posthemorrhagic anemia: Secondary | ICD-10-CM | POA: Diagnosis present

## 2014-03-23 DIAGNOSIS — I440 Atrioventricular block, first degree: Secondary | ICD-10-CM

## 2014-03-23 DIAGNOSIS — I429 Cardiomyopathy, unspecified: Secondary | ICD-10-CM | POA: Diagnosis present

## 2014-03-23 DIAGNOSIS — I059 Rheumatic mitral valve disease, unspecified: Secondary | ICD-10-CM

## 2014-03-23 DIAGNOSIS — Z7901 Long term (current) use of anticoagulants: Secondary | ICD-10-CM | POA: Diagnosis not present

## 2014-03-23 DIAGNOSIS — K921 Melena: Secondary | ICD-10-CM

## 2014-03-23 DIAGNOSIS — R609 Edema, unspecified: Secondary | ICD-10-CM

## 2014-03-23 DIAGNOSIS — I38 Endocarditis, valve unspecified: Secondary | ICD-10-CM

## 2014-03-23 DIAGNOSIS — E785 Hyperlipidemia, unspecified: Secondary | ICD-10-CM

## 2014-03-23 LAB — PROTIME-INR
INR: 3.88 — ABNORMAL HIGH (ref 0.00–1.49)
Prothrombin Time: 38.1 seconds — ABNORMAL HIGH (ref 11.6–15.2)

## 2014-03-23 LAB — CBC
HEMATOCRIT: 21.5 % — AB (ref 39.0–52.0)
Hemoglobin: 6.5 g/dL — CL (ref 13.0–17.0)
MCH: 29.4 pg (ref 26.0–34.0)
MCHC: 30.2 g/dL (ref 30.0–36.0)
MCV: 97.3 fL (ref 78.0–100.0)
Platelets: 359 10*3/uL (ref 150–400)
RBC: 2.21 MIL/uL — ABNORMAL LOW (ref 4.22–5.81)
RDW: 23.7 % — ABNORMAL HIGH (ref 11.5–15.5)
WBC: 6.8 10*3/uL (ref 4.0–10.5)

## 2014-03-23 LAB — BASIC METABOLIC PANEL
Anion gap: 10 (ref 5–15)
BUN: 21 mg/dL (ref 6–23)
CO2: 24 mEq/L (ref 19–32)
Calcium: 8.9 mg/dL (ref 8.4–10.5)
Chloride: 105 mEq/L (ref 96–112)
Creatinine, Ser: 1.67 mg/dL — ABNORMAL HIGH (ref 0.50–1.35)
GFR calc Af Amer: 46 mL/min — ABNORMAL LOW (ref >90)
GFR calc non Af Amer: 40 mL/min — ABNORMAL LOW (ref >90)
GLUCOSE: 107 mg/dL — AB (ref 70–99)
Potassium: 4.5 mEq/L (ref 3.7–5.3)
Sodium: 139 mEq/L (ref 137–147)

## 2014-03-23 LAB — I-STAT TROPONIN, ED: Troponin i, poc: 0.02 ng/mL (ref 0.00–0.08)

## 2014-03-23 LAB — POC OCCULT BLOOD, ED: FECAL OCCULT BLD: POSITIVE — AB

## 2014-03-23 LAB — PREPARE RBC (CROSSMATCH)

## 2014-03-23 LAB — PRO B NATRIURETIC PEPTIDE: Pro B Natriuretic peptide (BNP): 269.1 pg/mL — ABNORMAL HIGH (ref 0–125)

## 2014-03-23 MED ORDER — ACETAMINOPHEN 650 MG RE SUPP: 650.0000 mg | Freq: Four times a day (QID) | RECTAL | Status: DC | PRN

## 2014-03-23 MED ORDER — FUROSEMIDE 20 MG PO TABS
20.0000 mg | ORAL_TABLET | Freq: Every day | ORAL | Status: DC
  Administered 2014-03-23 – 2014-03-30 (×7): 20 mg via ORAL
  Filled 2014-03-23 (×8): qty 1

## 2014-03-23 MED ORDER — SODIUM CHLORIDE 0.9 % IJ SOLN
3.0000 mL | Freq: Two times a day (BID) | INTRAMUSCULAR | Status: DC
  Administered 2014-03-23 – 2014-03-24 (×3): 3 mL via INTRAVENOUS

## 2014-03-23 MED ORDER — LISINOPRIL 5 MG PO TABS
5.0000 mg | ORAL_TABLET | Freq: Every day | ORAL | Status: DC
  Administered 2014-03-23: 5 mg via ORAL
  Filled 2014-03-23 (×2): qty 1

## 2014-03-23 MED ORDER — ZOLPIDEM TARTRATE 5 MG PO TABS: 5.0000 mg | ORAL_TABLET | Freq: Every evening | ORAL | Status: DC | PRN

## 2014-03-23 MED ORDER — SODIUM CHLORIDE 0.9 % IV SOLN
10.0000 mL/h | Freq: Once | INTRAVENOUS | Status: AC
  Administered 2014-03-25: 10 mL/h via INTRAVENOUS

## 2014-03-23 MED ORDER — SODIUM CHLORIDE 0.9 % IV SOLN
Freq: Once | INTRAVENOUS | Status: AC
  Administered 2014-03-23: 10 mL/h via INTRAVENOUS

## 2014-03-23 MED ORDER — SODIUM CHLORIDE 0.9 % IV SOLN: Freq: Once | INTRAVENOUS | Status: AC

## 2014-03-23 MED ORDER — SODIUM CHLORIDE 0.9 % IV SOLN: 250.0000 mL | INTRAVENOUS | Status: DC | PRN

## 2014-03-23 MED ORDER — SODIUM CHLORIDE 0.9 % IJ SOLN
3.0000 mL | Freq: Two times a day (BID) | INTRAMUSCULAR | Status: DC
  Administered 2014-03-23 – 2014-03-30 (×11): 3 mL via INTRAVENOUS

## 2014-03-23 MED ORDER — PANTOPRAZOLE SODIUM 40 MG IV SOLR
40.0000 mg | Freq: Two times a day (BID) | INTRAVENOUS | Status: DC
  Administered 2014-03-23 – 2014-03-26 (×7): 40 mg via INTRAVENOUS
  Filled 2014-03-23 (×9): qty 40

## 2014-03-23 MED ORDER — CARVEDILOL 6.25 MG PO TABS
6.2500 mg | ORAL_TABLET | Freq: Two times a day (BID) | ORAL | Status: DC
  Filled 2014-03-23 (×3): qty 1

## 2014-03-23 MED ORDER — SODIUM CHLORIDE 0.9 % IV SOLN: 10.0000 mL/h | Freq: Once | INTRAVENOUS | Status: AC

## 2014-03-23 MED ORDER — SODIUM CHLORIDE 0.9 % IJ SOLN: 3.0000 mL | INTRAMUSCULAR | Status: DC | PRN

## 2014-03-23 MED ORDER — TRAMADOL HCL 50 MG PO TABS: 50.0000 mg | ORAL_TABLET | Freq: Three times a day (TID) | ORAL | Status: DC | PRN

## 2014-03-23 MED ORDER — ACETAMINOPHEN 325 MG PO TABS: 650.0000 mg | ORAL_TABLET | Freq: Four times a day (QID) | ORAL | Status: DC | PRN

## 2014-03-23 MED ORDER — ONDANSETRON HCL 4 MG PO TABS: 4.0000 mg | ORAL_TABLET | Freq: Four times a day (QID) | ORAL | Status: DC | PRN

## 2014-03-23 MED ORDER — ONDANSETRON HCL 4 MG/2ML IJ SOLN: 4.0000 mg | Freq: Four times a day (QID) | INTRAMUSCULAR | Status: DC | PRN

## 2014-03-23 NOTE — ED Notes (Signed)
Pt presents with intermittent mid-strum chest pain, SOB, weakness, dizziness, and feeling lightheaded x2-3 weeks. Pt states before he was able to mow his and his daughters yard front and back but now does not have the energy now and has increase SOB with just walking to the mailbox and back. Pt also states he can feel and hear his heart beating in his head/ears.

## 2014-03-23 NOTE — ED Notes (Signed)
Consent signed and at bedside  

## 2014-03-23 NOTE — Telephone Encounter (Signed)
Patient tells me that he had been SOB for 3 weeks now. He cannot walk 10 ft without experiencing SOB, he has no energy and tired all the time. Advised to call heart failure clinic to see if they have recommendations, as Dr. Graciela Husbands is out of the office until next week. Pt is agreeable and will call back if still needs help with this issue.

## 2014-03-23 NOTE — H&P (Addendum)
[facility]                                                                                    Patient Demographics  [name], is a 68 y.o. male  CSN: [id number]  MRN: [id number]  DOB - 1946/06/15  Admit Date - 03/23/2014  Outpatient Primary MD for the patient is Radford Pax, NP   With History of -  Past Medical History  Diagnosis Date  . 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  a- Echo 07/2008 Ef 55% mild peroprosthetic MVR with High transmitral gradient (mean 14).  b- normal coronaries by cath 12/2007  . Atrial fibrillation     post op.  s/p dc-cv 04/2008. previously on amiodarone  . Atrial flutter     atypical  . Heavy alcohol use     history  . IV drug abuse     history of  . CHF (congestive heart failure)     EF 35-40 % 2011 due to valvular disease and diastolic dysfunction  . History of GI bleed   . Hypertension   . GERD (gastroesophageal reflux disease)   . Chronic back pain   . AV block, 1st degree     .450 msec--progressive  . Dysphagia     with normal barium swallow 09/2008  . Pacemaker 2010      History reviewed. No pertinent past surgical history.  in for   Chief Complaint  Patient presents with  . Shortness of Breath  . Headache  . Chest Pain     HPI  [name]  is a 68 y.o. male, with past medical history significant for mitral valve repair with St. Jude's on Coumadin presenting with 2 weeks history of dyspnea and exertion and weakness. The patient was started on baby aspirin and July 28. He has been noticing dark tarry stools although he started around 7 days ago. He reports that the dark stool was prior to that . He complains of some epigastric pain but no crampy lower abdominal pain or diarrhea, no nausea or vomiting. In the emergency room he was noted to have a hemoglobin of 6.5 blood transfusion was started.    Review of Systems    In addition to the HPI above,  No  Fever-chills, No Headache, No changes with Vision or hearing, No problems swallowing food or Liquids, No Chest pain, Cough or Shortness of Breath,  No Nausea or Vommitting, Bowel movements are regular, No Blood in stool or Urine, No dysuria, No new skin rashes or bruises, No new joints pains-aches,  No new weakness, tingling, numbness in any extremity, No recent weight gain or loss, No polyuria, polydypsia or polyphagia, No significant Mental Stressors.  A full 10 point Review of Systems was done, except as stated above, all other Review of Systems were negative.   Social History History  Substance Use Topics  . Smoking status: Former Smoker -- 10 years    Types: Cigarettes  . Smokeless tobacco: Never Used     Comment: ONLY 3 PER DAY AFTER MEALS  . Alcohol Use: No  Comment: quit drinking 2 years ago     Family History Family History  Problem Relation Age of Onset  . Coronary artery disease Mother      Prior to Admission medications   Medication Sig Start Date End Date Taking? Authorizing Provider  aspirin EC 81 MG tablet Take 1 tablet (81 mg total) by mouth daily. 01/17/14  Yes Aundria Rud, NP  carvedilol (COREG) 6.25 MG tablet Take 1 tablet (6.25 mg total) by mouth 2 (two) times daily with a meal. 01/17/14  Yes Aundria Rud, NP  furosemide (LASIX) 20 MG tablet Take 20 mg by mouth daily.   Yes Historical Provider, MD  KLOR-CON M20 20 MEQ tablet Take 1 tablet by mouth Daily. 12/15/10  Yes Historical Provider, MD  lisinopril (PRINIVIL,ZESTRIL) 20 MG tablet Take 20 mg by mouth daily. 01/10/13  Yes [name], MD  omeprazole (PRILOSEC) 20 MG capsule Take 20 mg by mouth daily.     Yes Historical Provider, MD  traMADol (ULTRAM) 50 MG tablet Take 50 mg by mouth every 8 (eight) hours as needed for pain (as needed  for 30 days). 10/27/12  Yes Historical Provider, MD  warfarin (COUMADIN) 5 MG tablet Take 5-7.5 mg by mouth daily. Take 5 mg daily except take 7.5 mg on sun,  tue, and thur   Yes Historical Provider, MD    No Known Allergies  Physical Exam  Vitals  Blood pressure 101/71, pulse 89, temperature 98 F (36.7 C), temperature source Oral, resp. rate 23, height 5\' 8"  (1.727 m), weight 81.279 kg (179 lb 3 oz), SpO2 100.00%.   1. General elderly gentleman very pleasant looks chronically ill  2. Normal affect and insight, Not Suicidal or Homicidal, Awake Alert, Oriented X 3.  3. No F.N deficits grossly, ALL C.Nerves Intact,   4. Ears and Eyes appear Normal, Conjunctivae clear, PERRLA. Moist Oral Mucosa.  5. Supple Neck, No JVD, No cervical lymphadenopathy appriciated, No Carotid Bruits.  6. Symmetrical Chest wall movement, Good air movement bilaterally, CTAB.  7. RRR, No Gallops, Rubs or Murmurs, No Parasternal Heave.  8. Positive Bowel Sounds, Abdomen Soft, Non tender, No organomegaly appriciated,No rebound -guarding or rigidity.  9.  No Cyanosis, Normal Skin Turgor, No Skin Rash or Bruise.  10. Good muscle tone,  joints appear normal , bilateral ankle edema noted.  11. No Palpable Lymph Nodes in Neck or Axillae    Data Review  CBC  Recent Labs Lab 03/23/14 1452  WBC 6.8  HGB 6.5*  HCT 21.5*  PLT 359  MCV 97.3  MCH 29.4  MCHC 30.2  RDW 23.7*   ------------------------------------------------------------------------------------------------------------------  Chemistries   Recent Labs Lab 03/23/14 1452  NA 139  K 4.5  CL 105  CO2 24  GLUCOSE 107*  BUN 21  CREATININE 1.67*  CALCIUM 8.9   ------------------------------------------------------------------------------------------------------------------ estimated creatinine clearance is 39.8 ml/min (by C-G formula based on Cr of 1.67). ------------------------------------------------------------------------------------------------------------------ No results found for this basename: TSH, T4TOTAL, FREET3, T3FREE, THYROIDAB,  in the last 72 hours   Coagulation  profile  Recent Labs Lab 03/23/14 1717  INR 3.88*   ------------------------------------------------------------------------------------------------------------------- No results found for this basename: DDIMER,  in the last 72 hours -------------------------------------------------------------------------------------------------------------------  Cardiac Enzymes No results found for this basename: CK, CKMB, TROPONINI, MYOGLOBIN,  in the last 168 hours ------------------------------------------------------------------------------------------------------------------ No components found with this basename: POCBNP,    ---------------------------------------------------------------------------------------------------------------  Urinalysis    Component Value Date/Time   COLORURINE YELLOW 04/17/2009 2114   APPEARANCEUR  CLEAR 04/17/2009 2114   LABSPEC 1.014 04/17/2009 2114   PHURINE 5.5 04/17/2009 2114   GLUCOSEU NEG mg/dL 11/91/4782 9562   GLUCOSEU NEGATIVE 02/14/2008 1359   HGBUR LARGE* 02/14/2008 1359   BILIRUBINUR NEG 04/17/2009 2114   KETONESUR NEG mg/dL 13/02/6577 4696   PROTEINUR NEG mg/dL 29/52/8413 2440   UROBILINOGEN 1 04/17/2009 2114   NITRITE NEG 04/17/2009 2114   LEUKOCYTESUR NEG 04/17/2009 2114    ----------------------------------------------------------------------------------------------------------------     Imaging results:   Dg Chest 2 View  03/23/2014   CLINICAL DATA:  Shortness of breath. And chest pain with history of CHF and atrial fibrillation.  EXAM: CHEST  2 VIEW  COMPARISON:  Portable chest x-ray of March 12, 2011  FINDINGS: The lungs are adequately inflated. There is no focal infiltrate. The cardiopericardial silhouette is normal in size. The pulmonary vascularity is not engorged. There is a valve ring in the mitral position. The permanent pacemaker is normal in position. There is no pleural effusion or pneumothorax. The bony thorax is  unremarkable.  IMPRESSION: There is no acute cardiopulmonary abnormality.   Electronically Signed   By: David  [place]   On: 03/23/2014 16:00    My personal review of EKG: Paced rhythm    Assessment & Plan   1. GI bleed , patient on Coumadin and aspirin which will be held     Blood transfusion x2 units     Monitor hemoglobin hematocrit     IV Protonix     N.p.o. after midnight     Consult GI in a.m. for EGD/colonoscopy 2. Coagulopathy     Fish frozen plasma x2 units     Follow INR and try to keep between 2.5 and 3.5 when the acute episode is over 3. History of MVR with St. Jude's     Restart Coumadin when stable 4. History of IV drug abuse/alcoholism 5. History of atrial fibrillation/flutter-status post pacemaker     Continue Coreg 6. Hypertension     Decrease lisinopril/continue Coreg  DVT Prophylaxis SCDs  AM Labs Ordered, also please review Full Orders  Family Communication: Admission, patients condition and plan of care including tests being ordered have been discussed with the patient and daughter who indicate understanding and agree with the plan and Code Status.  Code Status full  Disposition Plan: Home  Time spent in minutes : 32 minutes  Condition GUARDED   @

## 2014-03-23 NOTE — Telephone Encounter (Signed)
New Prob    States pt has been experiencing worsening SOB. Requesting to speak to a nurse.

## 2014-03-23 NOTE — Telephone Encounter (Signed)
Call returned to patient and family, patient states his weight is stable, no edema noted, but feels very tired, quickly SOB with minimal exertion.  Unsure if this is fluid/CHF related.  Made an add-on appointment with our clinic for Monday, but advised to seek emergency medical services through our emergency department if he continues to feel bad.  Aware and agreeable. Ave Filter

## 2014-03-23 NOTE — Telephone Encounter (Signed)
Pt wife called pt is feeling.. Weak ,tired , dizzy and sob.Marland Kitchen She would like the pt to be seen today.. Please advise

## 2014-03-23 NOTE — ED Provider Notes (Signed)
CSN: [id number]     Arrival date & time 03/23/14  1427 History   First MD Initiated Contact with Patient 03/23/14 1707     Chief Complaint  Patient presents with  . Shortness of Breath  . Headache  . Chest Pain     (Consider location/radiation/quality/duration/timing/severity/associated sxs/prior Treatment) Patient is a 68 y.o. male presenting with shortness of breath, headaches, and chest pain. The history is provided by the patient. No language interpreter was used.  Shortness of Breath Severity:  Moderate Onset quality:  Gradual Duration:  2 weeks Timing:  Intermittent Progression:  Waxing and waning Chronicity:  New Context: activity   Relieved by:  Rest Worsened by:  Exertion and movement Associated symptoms: chest pain and headaches   Associated symptoms: no abdominal pain, no cough, no fever, no rash and no vomiting   Chest pain:    Chest pain quality: tightness.   Severity:  Mild   Onset quality:  Sudden   Duration:  2 weeks   Timing:  Intermittent   Progression:  Waxing and waning (w/ exerction)   Chronicity:  New Headache Associated symptoms: no abdominal pain, no back pain, no congestion, no cough, no diarrhea, no dizziness, no pain, no fatigue, no fever, no nausea, no numbness, no photophobia and no vomiting   Chest Pain Associated symptoms: headache and shortness of breath   Associated symptoms: no abdominal pain, no back pain, no cough, no dizziness, no dysphagia, no fatigue, no fever, no nausea, no numbness, not vomiting and no weakness     Past Medical History  Diagnosis Date  . 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  a- Echo 07/2008 Ef 55% mild peroprosthetic MVR with High transmitral gradient (mean 14).  b- normal coronaries by cath 12/2007  . Atrial fibrillation     post op.  s/p dc-cv 04/2008. previously on amiodarone  . Atrial flutter     atypical  . Heavy alcohol use     history  . IV drug abuse     history  of  . CHF (congestive heart failure)     EF 35-40 % 2011 due to valvular disease and diastolic dysfunction  . History of GI bleed   . Hypertension   . GERD (gastroesophageal reflux disease)   . Chronic back pain   . AV block, 1st degree     .450 msec--progressive  . Dysphagia     with normal barium swallow 09/2008  . Pacemaker 2010   History reviewed. No pertinent past surgical history. Family History  Problem Relation Age of Onset  . Coronary artery disease Mother    History  Substance Use Topics  . Smoking status: Former Smoker -- 10 years    Types: Cigarettes  . Smokeless tobacco: Never Used     Comment: ONLY 3 PER DAY AFTER MEALS  . Alcohol Use: No     Comment: quit drinking 2 years ago    Review of Systems  Constitutional: Negative for fever, activity change, appetite change and fatigue.  HENT: Negative for congestion, facial swelling, rhinorrhea and trouble swallowing.   Eyes: Negative for photophobia and pain.  Respiratory: Positive for shortness of breath. Negative for cough and chest tightness.   Cardiovascular: Positive for chest pain. Negative for leg swelling.  Gastrointestinal: Negative for nausea, vomiting, abdominal pain, diarrhea and constipation.       2 wks of black stool  Endocrine: Negative for polydipsia and polyuria.  Genitourinary:  Negative for dysuria, urgency, decreased urine volume and difficulty urinating.  Musculoskeletal: Negative for back pain and gait problem.  Skin: Negative for color change, rash and wound.  Allergic/Immunologic: Negative for immunocompromised state.  Neurological: Positive for headaches. Negative for dizziness, facial asymmetry, speech difficulty, weakness and numbness.  Psychiatric/Behavioral: Negative for confusion, decreased concentration and agitation.      Allergies  Review of patient's allergies indicates no known allergies.  Home Medications   Prior to Admission medications   Medication Sig Start Date End  Date Taking? Authorizing Provider  aspirin EC 81 MG tablet Take 1 tablet (81 mg total) by mouth daily. 01/17/14  Yes Aundria Rud, NP  carvedilol (COREG) 6.25 MG tablet Take 1 tablet (6.25 mg total) by mouth 2 (two) times daily with a meal. 01/17/14  Yes Aundria Rud, NP  furosemide (LASIX) 20 MG tablet Take 20 mg by mouth daily.   Yes Historical Provider, MD  KLOR-CON M20 20 MEQ tablet Take 1 tablet by mouth Daily. 12/15/10  Yes Historical Provider, MD  lisinopril (PRINIVIL,ZESTRIL) 20 MG tablet Take 20 mg by mouth daily. 01/10/13  Yes [name], MD  omeprazole (PRILOSEC) 20 MG capsule Take 20 mg by mouth daily.     Yes Historical Provider, MD  traMADol (ULTRAM) 50 MG tablet Take 50 mg by mouth every 8 (eight) hours as needed for pain (as needed  for 30 days). 10/27/12  Yes Historical Provider, MD  warfarin (COUMADIN) 5 MG tablet Take 5-7.5 mg by mouth daily. Take 5 mg daily except take 7.5 mg on sun, tue, and thur   Yes Historical Provider, MD   BP 119/60  Pulse 80  Temp(Src) 99 F (37.2 C) (Oral)  Resp 20  Ht  (1.727 m)  Wt 179 lb 3 oz (81.279 kg)  BMI 27.25 kg/m2  SpO2 99% Physical Exam  Constitutional: He is oriented to person, place, and time. He appears well-developed and well-nourished. No distress.  HENT:  Head: Normocephalic and atraumatic.  Mouth/Throat: No oropharyngeal exudate.  Eyes: Pupils are equal, round, and reactive to light.  Neck: Normal range of motion. Neck supple.  Cardiovascular: Normal rate, regular rhythm and normal heart sounds.  Exam reveals no gallop and no friction rub.   No murmur heard. Pulmonary/Chest: Effort normal and breath sounds normal. No respiratory distress. He has no wheezes. He has no rales.  Abdominal: Soft. Bowel sounds are normal. He exhibits no distension and no mass. There is no tenderness. There is no rebound and no guarding.  Genitourinary: Guaiac positive stool.  Musculoskeletal: Normal range of motion. He exhibits no  edema and no tenderness.  Neurological: He is alert and oriented to person, place, and time.  Skin: Skin is warm and dry.  Psychiatric: He has a normal mood and affect.    ED Course  Procedures (including critical care time) Labs Review Labs Reviewed  CBC - Abnormal; Notable for the following:    RBC 2.21 (*)    Hemoglobin 6.5 (*)    HCT 21.5 (*)    RDW 23.7 (*)    All other components within normal limits  PRO B NATRIURETIC PEPTIDE - Abnormal; Notable for the following:    Pro B Natriuretic peptide (BNP) 269.1 (*)    All other components within normal limits  BASIC METABOLIC PANEL - Abnormal; Notable for the following:    Glucose, Bld 107 (*)    Creatinine, Ser 1.67 (*)    GFR calc non Af Denyse Dago  40 (*)    GFR calc Af Amer 46 (*)    All other components within normal limits  PROTIME-INR  I-STAT TROPOININ, ED  POC OCCULT BLOOD, ED  TYPE AND SCREEN  PREPARE RBC (CROSSMATCH)    Imaging Review Dg Chest 2 View  03/23/2014   CLINICAL DATA:  Shortness of breath. And chest pain with history of CHF and atrial fibrillation.  EXAM: CHEST  2 VIEW  COMPARISON:  Portable chest x-ray of March 12, 2011  FINDINGS: The lungs are adequately inflated. There is no focal infiltrate. The cardiopericardial silhouette is normal in size. The pulmonary vascularity is not engorged. There is a valve ring in the mitral position. The permanent pacemaker is normal in position. There is no pleural effusion or pneumothorax. The bony thorax is unremarkable.  IMPRESSION: There is no acute cardiopulmonary abnormality.   Electronically Signed   By: David  [place]   On: 03/23/2014 16:00     EKG Interpretation   Date/Time:  Friday March 23 2014 14:47:19 EDT Ventricular Rate:  107 PR Interval:  192 QRS Duration: 132 QT Interval:  382 QTC Calculation: 509 R Axis:   -81 Text Interpretation:  Electronic ventricular pacemaker No significant  change since last tracing Confirmed by Dierdre Mccalip  MD, Gaige Sebo [phone]) on   03/23/2014 5:11:35 PM      MDM   Final diagnoses:  None    Pt is a 68 y.o. male with Pmhx as above who presents with about 2 weeks of shortness of breath with exertion as well as a dull mild midsternal chest pain with exertion, lightheadedness, dizziness. He states he has not been having regular stools and has to take milk of magnesia to have a store and when he does it is very dark. He has had some low BP reads this week and has not taken his coumadin for 2 days. On PE, patient is mildly tachycardic but BP stable and he is in no acute distress. Cardiopulmonary and abdominal exam is benign. Hemoglobin 6.5. Stool is dark and is heme positive. EKG is paced. Troponin is negative. BNP is mildly elevated. Patient typed and screened for transfusion of one unit of PRBCs. We'll consult internal medicine for admission. Suspect GI bleed in the setting of Coumadin use.        Toy Cookey, MD 03/24/14 [phone]

## 2014-03-23 NOTE — ED Notes (Signed)
Pt has a St. Judes pacemaker, pt states is is unsure if it is a defibrillator

## 2014-03-24 DIAGNOSIS — I059 Rheumatic mitral valve disease, unspecified: Secondary | ICD-10-CM

## 2014-03-24 DIAGNOSIS — D62 Acute posthemorrhagic anemia: Secondary | ICD-10-CM

## 2014-03-24 DIAGNOSIS — Z950 Presence of cardiac pacemaker: Secondary | ICD-10-CM

## 2014-03-24 DIAGNOSIS — K921 Melena: Secondary | ICD-10-CM

## 2014-03-24 LAB — BASIC METABOLIC PANEL
Anion gap: 12 (ref 5–15)
BUN: 19 mg/dL (ref 6–23)
CHLORIDE: 105 meq/L (ref 96–112)
CO2: 24 mEq/L (ref 19–32)
Calcium: 8.7 mg/dL (ref 8.4–10.5)
Creatinine, Ser: 1.54 mg/dL — ABNORMAL HIGH (ref 0.50–1.35)
GFR calc Af Amer: 51 mL/min — ABNORMAL LOW (ref >90)
GFR calc non Af Amer: 44 mL/min — ABNORMAL LOW (ref >90)
Glucose, Bld: 104 mg/dL — ABNORMAL HIGH (ref 70–99)
Potassium: 3.9 mEq/L (ref 3.7–5.3)
Sodium: 141 mEq/L (ref 137–147)

## 2014-03-24 LAB — CBC
HCT: 23.7 % — ABNORMAL LOW (ref 39.0–52.0)
HCT: 26.4 % — ABNORMAL LOW (ref 39.0–52.0)
HEMOGLOBIN: 8.5 g/dL — AB (ref 13.0–17.0)
HEMOGLOBIN: 7.2 g/dL — AB (ref 13.0–17.0)
MCH: 29.4 pg (ref 26.0–34.0)
MCH: 28.1 pg (ref 26.0–34.0)
MCHC: 30.4 g/dL (ref 30.0–36.0)
MCHC: 32.2 g/dL (ref 30.0–36.0)
MCV: 91.3 fL (ref 78.0–100.0)
MCV: 92.6 fL (ref 78.0–100.0)
Platelets: 341 10*3/uL (ref 150–400)
Platelets: 317 10*3/uL (ref 150–400)
RBC: 2.89 MIL/uL — ABNORMAL LOW (ref 4.22–5.81)
RBC: 2.56 MIL/uL — AB (ref 4.22–5.81)
RDW: 22.3 % — ABNORMAL HIGH (ref 11.5–15.5)
RDW: 21.2 % — ABNORMAL HIGH (ref 11.5–15.5)
WBC: 8 10*3/uL (ref 4.0–10.5)
WBC: 6.9 10*3/uL (ref 4.0–10.5)

## 2014-03-24 LAB — PROTIME-INR
INR: 3.71 — ABNORMAL HIGH (ref 0.00–1.49)
INR: 2.2 — ABNORMAL HIGH (ref 0.00–1.49)
Prothrombin Time: 36.8 seconds — ABNORMAL HIGH (ref 11.6–15.2)
Prothrombin Time: 24.4 seconds — ABNORMAL HIGH (ref 11.6–15.2)

## 2014-03-24 LAB — HEMOGLOBIN: Hemoglobin: 7.3 g/dL — ABNORMAL LOW (ref 13.0–17.0)

## 2014-03-24 NOTE — Progress Notes (Signed)
Pt has an order for 2 units of RBC and 2 units of FFP to be transfused. Received the pt from ED with ongoing first bag of RBC transfusing. Orders reviewed and MD was unable to specify if how many hours the RBC transfusion will last. Paged NP on-call Claiborne Billings and clarified the order; said that RBC should run for 4 hours and check Hgb post transfusion. If Hgb is less than 8, transfuse the 2nd unit of RBC, otherwise transfuse 2 units of platelets. Orders noted and acknowledged. Will continue to monitor.

## 2014-03-24 NOTE — Progress Notes (Signed)
New Admission Note:   Arrival Method: per stretcher from ED with RN Edmon Crape Mental Orientation: alert, oriented X4, cooperative Telemetry: placed on telebox 5 after CCMD notified Assessment: Completed Skin: warm, dry, intact with no wounds noted IV: G20 on the left AC with on going blood transfusion, G20 on the right FA, both with transparent dressing, clean, dry and intact Pain: denies having any pain as of this time Tubes: Y set with filter with BT going on at the left [facility] Safety Measures: Safety Fall Prevention Plan has been given, discussed and signed Admission: Completed 6 [place] Orientation: Patient has been orientated to the room, unit and staff.  Family: wife, daughter and grandchild at the bedside  Orders have been reviewed and implemented. Will continue to monitor the patient. Call light has been placed within reach and bed alarm has been activated.   Janice Norrie BSN, RN [facility] [address] [phone]

## 2014-03-24 NOTE — Consult Note (Signed)
Consultation  Referring Provider:[facility]      Primary Care Physician:  Radford Pax, NP Primary Gastroenterologist: Rob Bunting, MD         Reason for Consultation:  GI bleed            HPI:   [name] is a 68 y.o. male known remotely to Dr. Christella Hartigan for a history of colon polyps (tubulovillous rectal polyp 2009). Patient has multiple medical problems. He is s/p MVR (on chronic coumadin now) for endocarditis associated with IVDA. Patient presented to ED with chest pain, SOB, and weakness. He reports black stools over last couple of weeks though no stools in 2-3 days. Hgb was 10.8 in mid July, it was 6.5 yesterday. INR 3.8. Patient was started on a baby asa within last few months. He takes alka-seltzer twice a week for indigestion and has done that for years. Patient isn't otherwise familiar with home medications. Prilosec is listed on home meds.   Past Medical History  Diagnosis Date  . 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  a- Echo 07/2008 Ef 55% mild peroprosthetic MVR with High transmitral gradient (mean 14).  b- normal coronaries by cath 12/2007  . Atrial fibrillation     post op.  s/p dc-cv 04/2008. previously on amiodarone  . Atrial flutter     atypical  . Heavy alcohol use     history  . IV drug abuse     history of  . CHF (congestive heart failure)     EF 35-40 % 2011 due to valvular disease and diastolic dysfunction  . History of GI bleed   . Hypertension   . GERD (gastroesophageal reflux disease)   . Chronic back pain   . AV block, 1st degree     .450 msec--progressive  . Dysphagia     with normal barium swallow 09/2008  . Pacemaker 2010    History reviewed. No pertinent past surgical history.  Family History  Problem Relation Age of Onset  . Coronary artery disease Mother      History  Substance Use Topics  . Smoking status: Former Smoker -- 10 years    Types: Cigarettes  . Smokeless tobacco: Never  Used     Comment: ONLY 3 PER DAY AFTER MEALS  . Alcohol Use: No     Comment: quit drinking 2 years ago    Prior to Admission medications   Medication Sig Start Date End Date Taking? Authorizing Provider  aspirin EC 81 MG tablet Take 1 tablet (81 mg total) by mouth daily. 01/17/14  Yes Aundria Rud, NP  carvedilol (COREG) 6.25 MG tablet Take 1 tablet (6.25 mg total) by mouth 2 (two) times daily with a meal. 01/17/14  Yes Aundria Rud, NP  furosemide (LASIX) 20 MG tablet Take 20 mg by mouth daily.   Yes Historical Provider, MD  KLOR-CON M20 20 MEQ tablet Take 1 tablet by mouth Daily. 12/15/10  Yes Historical Provider, MD  lisinopril (PRINIVIL,ZESTRIL) 20 MG tablet Take 20 mg by mouth daily. 01/10/13  Yes [name], MD  omeprazole (PRILOSEC) 20 MG capsule Take 20 mg by mouth daily.     Yes Historical Provider, MD  traMADol (ULTRAM) 50 MG tablet Take 50 mg by mouth every 8 (eight) hours as needed for pain (as needed  for 30 days). 10/27/12  Yes Historical Provider, MD  warfarin (COUMADIN) 5 MG tablet Take 5-7.5  mg by mouth daily. Take 5 mg daily except take 7.5 mg on sun, tue, and thur   Yes Historical Provider, MD    Current Facility-Administered Medications  Medication Dose Route Frequency Provider Last Rate Last Dose  . 0.9 %  sodium chloride infusion  10 mL/hr Intravenous Once Toy Cookey, MD      . 0.9 %  sodium chloride infusion  250 mL Intravenous PRN Carron Curie, MD      . acetaminophen (TYLENOL) tablet 650 mg  650 mg Oral Q6H PRN Carron Curie, MD       Or  . acetaminophen (TYLENOL) suppository 650 mg  650 mg Rectal Q6H PRN Carron Curie, MD      . furosemide (LASIX) tablet 20 mg  20 mg Oral Daily Carron Curie, MD   20 mg at 03/23/14 2202  . ondansetron (ZOFRAN) tablet 4 mg  4 mg Oral Q6H PRN Carron Curie, MD       Or  . ondansetron (ZOFRAN) injection 4 mg  4 mg Intravenous Q6H PRN Carron Curie, MD      . pantoprazole (PROTONIX) injection 40 mg  40 mg Intravenous Q12H Carron Curie, MD    40 mg at 03/23/14 2202  . sodium chloride 0.9 % injection 3 mL  3 mL Intravenous Q12H Carron Curie, MD   3 mL at 03/23/14 2203  . sodium chloride 0.9 % injection 3 mL  3 mL Intravenous Q12H Carron Curie, MD   3 mL at 03/23/14 2203  . sodium chloride 0.9 % injection 3 mL  3 mL Intravenous PRN Carron Curie, MD      . traMADol Janean Sark) tablet 50 mg  50 mg Oral Q8H PRN Carron Curie, MD      . zolpidem (AMBIEN) tablet 5 mg  5 mg Oral QHS PRN Carron Curie, MD        Allergies as of 03/23/2014  . (No Known Allergies)    Review of Systems:    All systems reviewed and negative except where noted in HPI.   Physical Exam:  Vital signs in last 24 hours: Temp:  [98 F (36.7 C)-99 F (37.2 C)] 99 F (37.2 C) (09/19 0615) Pulse Rate:  [80-102] 95 (09/19 0615) Resp:  [18-24] 19 (09/19 0211) BP: (99-128)/(54-79) 99/59 mmHg (09/19 0615) SpO2:  [96 %-100 %] 98 % (09/19 0615) Weight:  [177 lb (80.287 kg)-179 lb 3 oz (81.279 kg)] 177 lb (80.287 kg) (09/18 2031) Last BM Date: 03/21/14 General:   Black male in NAD Head:  Normocephalic and atraumatic. Eyes:   No icterus.   Conjunctiva pink. Ears:  Normal auditory acuity. Neck:  Supple; no masses felt Lungs:  Respirations even and unlabored. Lungs clear to auscultation bilaterally.   No wheezes, crackles, or rhonchi.  Heart:  Regular rate and rhythm Abdomen:  Soft, nondistended, nontender. Normal bowel sounds. No appreciable masses or hepatomegaly.  Rectal:  Scant amount of light brown residual in vault.   Msk:  Symmetrical without gross deformities.  Extremities:  Without edema. Neurologic:  Alert and  oriented x4;  grossly normal neurologically. Skin:  Intact without significant lesions or rashes. Cervical Nodes:  No significant cervical adenopathy. Psych:  Alert and cooperative. Poor eye contact.  LAB RESULTS:  Recent Labs  03/23/14 1452 03/24/14 0051  WBC 6.8 8.0  HGB 6.5* 7.2*  7.3*  HCT 21.5* 23.7*  PLT 359 341   BMET  Recent Labs   03/23/14 1452 03/24/14 0051  NA 139 141  K  4.5 3.9  CL 105 105  CO2 24 24  GLUCOSE 107* 104*  BUN 21 19  CREATININE 1.67* 1.54*  CALCIUM 8.9 8.7   LFT No results found for this basename: PROT, ALBUMIN, AST, ALT, ALKPHOS, BILITOT, BILIDIR, IBILI,  in the last 72 hours PT/INR  Recent Labs  03/23/14 1717 03/24/14 0051  LABPROT 38.1* 36.8*  INR 3.88* 3.71*    STUDIES: Dg Chest 2 View  03/23/2014   CLINICAL DATA:  Shortness of breath. And chest pain with history of CHF and atrial fibrillation.  EXAM: CHEST  2 VIEW  COMPARISON:  Portable chest x-ray of March 12, 2011  FINDINGS: The lungs are adequately inflated. There is no focal infiltrate. The cardiopericardial silhouette is normal in size. The pulmonary vascularity is not engorged. There is a valve ring in the mitral position. The permanent pacemaker is normal in position. There is no pleural effusion or pneumothorax. The bony thorax is unremarkable.  IMPRESSION: There is no acute cardiopulmonary abnormality.   Electronically Signed   By: David  [place]   On: 03/23/2014 16:00   PREVIOUS ENDOSCOPIES:             EGD by Dr. Christella Hartigan Aug 2009 for heme + anemia. Normal exam  Colonoscopy Dr. Christella Hartigan Procedure date: 02/24/2008 Evaluation of:  Anemia Positive fecal occult blood test  Extent of exam reached: Cecum, extent intended: Cecum. The cecum was  identified by appendiceal orifice and IC valve. Patient position: on  left side. Colon retroflexion performed.  Findings  - DIVERTICULOSIS: Descending Colon to Sigmoid Colon.  POLYP: Rectum, Maximum size: 20 mm. pedunculated polyp. Procedure:  snare with cautery, The polyp was removed piece meal. removed,  retrieved, Polyp sent to pathology. Path # 1. Comments: Heparin was  help by surgery team earlier today and so it was safe to remove this  fairly large polyp. Marland Kitchen  NORMAL EXAM: Cecum to Rectum. Comments: otherwise normal  examination, fair prep limited examination somewhat. Small  lesions  may have been missed.  Assessment:  FAIR PREP LIMITED EXAMINATION, SMALL LESIONS MAY HAVE BEEN MISSED.  LEFT SIDED DIVERTICULOSIS. 2CM POLYP IN DISTAL RECTUM WAS REMOVED  WITH SNARE/CAUTERY. GIVEN THAT HE WAS FOBT + ONLY ON ONE OUT OF  THREE SAMPLES, I DO NOT THINK THAT THIS POLYPS ACCOUNTS FOR HIS  SIGNIFICANT ANEMIA. EGD WAS NORMAL AS WELL. WOULD NOT PROCEED WITH  FURTHER GI WORKUP AT THIS POINT, HE WILL NEED POLYP SURVEILLANCE IN  3 YEARS, DEPENDING ON PATHOLOGY RESULTS.  RECTUM, POLYP: - TUBULOVILLOUS ADENOMATOUS POLYP. - NO HIGH GRADE DYSPLASIA OR INVASIVE MALIGNANCY IDENTIFIED.   Impression / Plan:   9. 68 year old male with heme positive stools, reportedly black stools over last two weeks, and severe normocytic anemia in setting of coumadin and NSAIDS (baby asa + alka setzer). Creatinine chronically elevated, interestingly his BUN is normal. Rule out erosive disease, PUD, AVMs.  Keep NPO. He is s/p FFP, will check check INR now and plan for EGD later today if INR down. If EGD negative then will need further endoscopic workup. He has received 2 units of blood, hgb 7.3 at midnight  (after 1st unit).  2. Coagulopathy. On coumadin for MVP. Will recheck INR.  3. Permanent pacemaker  4. Hx of tubulovillous adenomatous polyps 2009. Patient overdue for surveillance colonoscopy.  4. Hx of ETOH abuse. There was question of cirrhosis at one time. I cannot find any evidence for cirrhosis by labs, imaging or exam.  Thanks   LOS:  1 day   Willette Cluster  03/24/2014, 9:24 AM    [facility] Attending  I have also seen and assessed the patient and agree with the above note. INR should be ok tomorrow for EGD With MVR will want to limit time off A/c tx Still low Txic now  Needs an EGD to evaluate cause of melena The risks and benefits as well as alternatives of endoscopic procedure(s) have been discussed and reviewed. All questions answered. The patient agrees to proceed.  Iva Boop, MD, [facility] [phone] (pager) 03/24/2014 4:09 PM

## 2014-03-24 NOTE — Progress Notes (Signed)
Chart reviewed.  [facility] PROGRESS NOTE  KELVYN SCHUNK ZOX:[id number] DOB: 04/01/1946 DOA: 03/23/2014 PCP: Radford Pax, NP [facility]  Assessment/Plan:  Principal Problem:   GI bleed: Patient reports having had melanotic stools for several days over the past week. No stools for 3 days. No vomiting. Has received 2 units of packed red blood cells and 1 unit of FFP. Has another unit of FFP due. Have consulted GI. Patient remains n.p.o. Blood pressure is stable. Will check INR and H&H after last unit of FFP. Active Problems: Acute blood loss anemia   HYPERTENSION, BENIGN   MITRAL STENOSIS/ INSUFFICIENCY, NON-RHEUMATIC with mechanical mitral valve. Coumadin being reversed.   Secondary cardiomyopathy, valvular and nonspecific: Last echocardiogram in September 2014 showed an ejection fraction of 45-50%. No evidence of acute heart failure.   Chronic systolic heart failure   Pacemaker-CRT-St judes History of atrial fibrillation/flutter   Code Status:  full Family Communication:   Disposition Plan:  home  Consultants:  [facility]  Procedures:     Antibiotics:    HPI/Subjective: Denies shortness of breath, chest pain. Denies bleeding. No weakness. Got up to the bathroom without difficulty.  Objective: Filed Vitals:   03/24/14 0615  BP: 99/59  Pulse: 95  Temp: 99 F (37.2 C)  Resp:     Intake/Output Summary (Last 24 hours) at 03/24/14 0857 Last data filed at 03/24/14 0556  Gross per 24 hour  Intake 1524.5 ml  Output    200 ml  Net 1324.5 ml   Filed Weights   03/23/14 1443 03/23/14 2031  Weight: 81.279 kg (179 lb 3 oz) 80.287 kg (177 lb)    Exam:   General:  Alert, oriented, cooperative and comfortable.  Cardiovascular: Regular rate rhythm. Mechanical S1. Systolic murmur  Respiratory: Clear to auscultation bilaterally without wheezes rhonchi or rales  Abdomen: Soft nontender nondistended normal bowel sounds  Ext: No clubbing cyanosis or  edema  Basic Metabolic Panel:  Recent Labs Lab 03/23/14 1452 03/24/14 0051  NA 139 141  K 4.5 3.9  CL 105 105  CO2 24 24  GLUCOSE 107* 104*  BUN 21 19  CREATININE 1.67* 1.54*  CALCIUM 8.9 8.7   Liver Function Tests: No results found for this basename: AST, ALT, ALKPHOS, BILITOT, PROT, ALBUMIN,  in the last 168 hours No results found for this basename: LIPASE, AMYLASE,  in the last 168 hours No results found for this basename: AMMONIA,  in the last 168 hours CBC:  Recent Labs Lab 03/23/14 1452 03/24/14 0051  WBC 6.8 8.0  HGB 6.5* 7.2*  7.3*  HCT 21.5* 23.7*  MCV 97.3 92.6  PLT 359 341   Cardiac Enzymes: No results found for this basename: CKTOTAL, CKMB, CKMBINDEX, TROPONINI,  in the last 168 hours BNP (last 3 results)  Recent Labs  01/17/14 1211 03/23/14 1452  PROBNP 378.6* 269.1*   CBG: No results found for this basename: GLUCAP,  in the last 168 hours  No results found for this or any previous visit (from the past 240 hour(s)).   Studies: Dg Chest 2 View  03/23/2014   CLINICAL DATA:  Shortness of breath. And chest pain with history of CHF and atrial fibrillation.  EXAM: CHEST  2 VIEW  COMPARISON:  Portable chest x-ray of March 12, 2011  FINDINGS: The lungs are adequately inflated. There is no focal infiltrate. The cardiopericardial silhouette is normal in size. The pulmonary vascularity is not engorged. There is a valve ring in the mitral position. The  permanent pacemaker is normal in position. There is no pleural effusion or pneumothorax. The bony thorax is unremarkable.  IMPRESSION: There is no acute cardiopulmonary abnormality.   Electronically Signed   By: David  [place]   On: 03/23/2014 16:00    Scheduled Meds: . sodium chloride  10 mL/hr Intravenous Once  . carvedilol  6.25 mg Oral BID WC  . furosemide  20 mg Oral Daily  . lisinopril  5 mg Oral Daily  . pantoprazole (PROTONIX) IV  40 mg Intravenous Q12H  . sodium chloride  3 mL Intravenous Q12H  .  sodium chloride  3 mL Intravenous Q12H   Continuous Infusions:   Time spent: 35 minutes  Ammie Warrick L  [facility] Pager [phone]. If 7PM-7AM, please contact night-coverage at [email or web address], password [facility] 03/24/2014, 8:57 AM  LOS: 1 day

## 2014-03-25 ENCOUNTER — Encounter (HOSPITAL_COMMUNITY): Payer: Self-pay | Admitting: *Deleted

## 2014-03-25 ENCOUNTER — Encounter (HOSPITAL_COMMUNITY)
Admission: EM | Disposition: A | Discharge status: Home / Self Care | Payer: Self-pay | Source: Home / Self Care | Attending: Internal Medicine

## 2014-03-25 DIAGNOSIS — I429 Cardiomyopathy, unspecified: Secondary | ICD-10-CM

## 2014-03-25 DIAGNOSIS — Z7901 Long term (current) use of anticoagulants: Secondary | ICD-10-CM

## 2014-03-25 HISTORY — PX: ESOPHAGOGASTRODUODENOSCOPY: SHX5428

## 2014-03-25 LAB — BASIC METABOLIC PANEL
Anion gap: 17 — ABNORMAL HIGH (ref 5–15)
BUN: 25 mg/dL — AB (ref 6–23)
CO2: 20 mEq/L (ref 19–32)
Calcium: 9.2 mg/dL (ref 8.4–10.5)
Chloride: 104 mEq/L (ref 96–112)
Creatinine, Ser: 1.56 mg/dL — ABNORMAL HIGH (ref 0.50–1.35)
GFR, EST AFRICAN AMERICAN: 50 mL/min — AB (ref >90)
GFR, EST NON AFRICAN AMERICAN: 43 mL/min — AB (ref >90)
Glucose, Bld: 89 mg/dL (ref 70–99)
POTASSIUM: 3.9 meq/L (ref 3.7–5.3)
SODIUM: 141 meq/L (ref 137–147)

## 2014-03-25 LAB — PREPARE FRESH FROZEN PLASMA

## 2014-03-25 LAB — TYPE AND SCREEN
ABO/RH(D): O POS
ANTIBODY SCREEN: NEGATIVE
Unit division: 0
Unit division: 0

## 2014-03-25 LAB — APTT: APTT: 40 s — AB (ref 24–37)

## 2014-03-25 LAB — PROTIME-INR
INR: 2.26 — ABNORMAL HIGH (ref 0.00–1.49)
INR: 2 — ABNORMAL HIGH (ref 0.00–1.49)
PROTHROMBIN TIME: 25 s — AB (ref 11.6–15.2)
PROTHROMBIN TIME: 22.7 s — AB (ref 11.6–15.2)

## 2014-03-25 LAB — HEPARIN LEVEL (UNFRACTIONATED): Heparin Unfractionated: 0.43 IU/mL (ref 0.30–0.70)

## 2014-03-25 LAB — HEMOGLOBIN AND HEMATOCRIT, BLOOD
HCT: 28.1 % — ABNORMAL LOW (ref 39.0–52.0)
Hemoglobin: 8.7 g/dL — ABNORMAL LOW (ref 13.0–17.0)

## 2014-03-25 SURGERY — EGD (ESOPHAGOGASTRODUODENOSCOPY)
Anesthesia: Moderate Sedation

## 2014-03-25 MED ORDER — PEG-KCL-NACL-NASULF-NA ASC-C 100 G PO SOLR
0.5000 | Freq: Once | ORAL | Status: AC
  Administered 2014-03-25: 100 g via ORAL
  Filled 2014-03-25: qty 1

## 2014-03-25 MED ORDER — METOCLOPRAMIDE HCL 5 MG/ML IJ SOLN
5.0000 mg | Freq: Once | INTRAMUSCULAR | Status: AC
  Administered 2014-03-25: 5 mg via INTRAVENOUS
  Filled 2014-03-25: qty 1

## 2014-03-25 MED ORDER — FUROSEMIDE 10 MG/ML IJ SOLN
20.0000 mg | Freq: Once | INTRAMUSCULAR | Status: AC
  Administered 2014-03-25: 20 mg via INTRAVENOUS
  Filled 2014-03-25: qty 2

## 2014-03-25 MED ORDER — FENTANYL CITRATE 0.05 MG/ML IJ SOLN
INTRAMUSCULAR | Status: DC | PRN
  Administered 2014-03-25: 25 ug via INTRAVENOUS

## 2014-03-25 MED ORDER — PEG-KCL-NACL-NASULF-NA ASC-C 100 G PO SOLR
0.5000 | Freq: Once | ORAL | Status: AC
  Administered 2014-03-26: 100 g via ORAL
  Filled 2014-03-25: qty 1

## 2014-03-25 MED ORDER — SODIUM CHLORIDE 0.9 % IV SOLN
INTRAVENOUS | Status: DC
  Administered 2014-03-26: 500 mL via INTRAVENOUS

## 2014-03-25 MED ORDER — PEG-KCL-NACL-NASULF-NA ASC-C 100 G PO SOLR: 0.5000 | Freq: Once | ORAL | Status: DC

## 2014-03-25 MED ORDER — BUTAMBEN-TETRACAINE-BENZOCAINE 2-2-14 % EX AERO
INHALATION_SPRAY | CUTANEOUS | Status: DC | PRN
  Administered 2014-03-25: 2 via TOPICAL

## 2014-03-25 MED ORDER — DIPHENHYDRAMINE HCL 50 MG/ML IJ SOLN
INTRAMUSCULAR | Status: DC | PRN
  Administered 2014-03-25: 25 mg via INTRAVENOUS

## 2014-03-25 MED ORDER — SODIUM CHLORIDE 0.9 % IV SOLN: Freq: Once | INTRAVENOUS | Status: DC

## 2014-03-25 MED ORDER — SODIUM CHLORIDE 0.9 % IV SOLN
INTRAVENOUS | Status: DC
  Administered 2014-03-25 – 2014-03-29 (×3): via INTRAVENOUS

## 2014-03-25 MED ORDER — MIDAZOLAM HCL 10 MG/2ML IJ SOLN
INTRAMUSCULAR | Status: DC | PRN
  Administered 2014-03-25: 2 mg via INTRAVENOUS

## 2014-03-25 MED ORDER — DIPHENHYDRAMINE HCL 50 MG/ML IJ SOLN
INTRAMUSCULAR | Status: AC
  Filled 2014-03-25: qty 1

## 2014-03-25 MED ORDER — HEPARIN (PORCINE) IN NACL 100-0.45 UNIT/ML-% IJ SOLN
1150.0000 [IU]/h | INTRAMUSCULAR | Status: AC
  Administered 2014-03-25 – 2014-03-27 (×3): 1150 [IU]/h via INTRAVENOUS
  Filled 2014-03-25 (×5): qty 250

## 2014-03-25 MED ORDER — MIDAZOLAM HCL 5 MG/ML IJ SOLN
INTRAMUSCULAR | Status: AC
  Filled 2014-03-25: qty 2

## 2014-03-25 MED ORDER — FENTANYL CITRATE 0.05 MG/ML IJ SOLN
INTRAMUSCULAR | Status: AC
  Filled 2014-03-25: qty 4

## 2014-03-25 MED ORDER — METOCLOPRAMIDE HCL 5 MG/ML IJ SOLN
5.0000 mg | Freq: Once | INTRAMUSCULAR | Status: AC
  Administered 2014-03-26: 5 mg via INTRAVENOUS
  Filled 2014-03-25: qty 2

## 2014-03-25 NOTE — Progress Notes (Signed)
INR still 2.26. Will give 2 more units ffp for procedure.   Crista Curb, MD

## 2014-03-25 NOTE — Progress Notes (Addendum)
ANTICOAGULATION CONSULT NOTE - Initial Consult  Pharmacy Consult for heparin Indication: mech MVR  No Known Allergies  Patient Measurements: Height:  (172.7 cm) Weight: 177 lb (80.287 kg) IBW/kg (Calculated) : 68.4 Heparin Dosing Weight: 80 kg  Vital Signs: Temp: 98.5 F (36.9 C) (09/20 1216) Temp src: Oral (09/20 1216) BP: 100/70 mmHg (09/20 1216) Pulse Rate: 102 (09/20 1216)  Labs:  Recent Labs  03/23/14 1452  03/24/14 0051 03/24/14 1131 03/25/14 0628  HGB 6.5*  --  7.2*  7.3* 8.5* 8.7*  HCT 21.5*  --  23.7* 26.4* 28.1*  PLT 359  --  341 317  --   LABPROT  --   < > 36.8* 24.4* 25.0*  INR  --   < > 3.71* 2.20* 2.26*  CREATININE 1.67*  --  1.54*  --  1.56*  < > = values in this interval not displayed.  Estimated Creatinine Clearance: 42.6 ml/min (by C-G formula based on Cr of 1.56).   Medical History: Past Medical History  Diagnosis Date  . 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  a- Echo 07/2008 Ef 55% mild peroprosthetic MVR with High transmitral gradient (mean 14).  b- normal coronaries by cath 12/2007  . Atrial fibrillation     post op.  s/p dc-cv 04/2008. previously on amiodarone  . Atrial flutter     atypical  . Heavy alcohol use     history  . IV drug abuse     history of  . CHF (congestive heart failure)     EF 35-40 % 2011 due to valvular disease and diastolic dysfunction  . History of GI bleed   . Hypertension   . GERD (gastroesophageal reflux disease)   . Chronic back pain   . AV block, 1st degree     .450 msec--progressive  . Dysphagia     with normal barium swallow 09/2008  . Pacemaker 2010    Medications:  Prescriptions prior to admission  Medication Sig Dispense Refill  . aspirin EC 81 MG tablet Take 1 tablet (81 mg total) by mouth daily.  30 tablet  3  . carvedilol (COREG) 6.25 MG tablet Take 1 tablet (6.25 mg total) by mouth 2 (two) times daily with a meal.  60 tablet  6  . furosemide  (LASIX) 20 MG tablet Take 20 mg by mouth daily.      Marland Kitchen KLOR-CON M20 20 MEQ tablet Take 1 tablet by mouth Daily.      Marland Kitchen lisinopril (PRINIVIL,ZESTRIL) 20 MG tablet Take 20 mg by mouth daily.      Marland Kitchen omeprazole (PRILOSEC) 20 MG capsule Take 20 mg by mouth daily.        . traMADol (ULTRAM) 50 MG tablet Take 50 mg by mouth every 8 (eight) hours as needed for pain (as needed  for 30 days).      . warfarin (COUMADIN) 5 MG tablet Take 5-7.5 mg by mouth daily. Take 5 mg daily except take 7.5 mg on sun, tue, and thur        Assessment: 68 yo man to start heparin bridge as coumadin on hold for colonoscopy tomorrow.   Goal of Therapy:  Heparin level 0.3-0.7 units/ml Monitor platelets by anticoagulation protocol: Yes   Plan:  Start heparin no bolus at 1150 units/hr Check heparin level 8 hours after start. F/u time of procedure to stop heparin.  Thanks for allowing pharmacy to be a part of this  patient's care.  Talbert Cage, PharmD Clinical Pharmacist, [phone] 03/25/2014,12:31 PM    Addendum:  Heparin level is therapeutic at 0.43 on 1150 units/hr. No bleeding noted.  Continue this rate and f/u am level  [facility], [address].D., BCPS Clinical Pharmacist Pager: [phone] 03/25/2014 10:19 PM

## 2014-03-25 NOTE — Progress Notes (Signed)
   I changed transfusion of FFP to 1 U  INR around 2 is fine for EGD and do not want to overcorrect this guy with MVR.  Iva Boop, MD, [facility] [phone] (pager) 03/25/2014 8:37 AM

## 2014-03-25 NOTE — Progress Notes (Signed)
[facility] PROGRESS NOTE  [name] ENI:[id number] DOB: 07-26-1945 DOA: 03/23/2014 PCP: Radford Pax, NP [facility] Summary: 68 year old male admitted with symptomatic anemia and melanotic stools over the past week. On Coumadin for mechanical valve. Has received 2 units of packed red blood cells and 2 units of FFP.   Assessment/Plan:  Principal Problem:   GI bleed: No stools while here. Hemoglobin improved. INR still above 2. We'll give 2 more units of FFP. Should be able to proceed with EGD today. Appreciate GI. Active Problems: Acute blood loss anemia   HYPERTENSION, BENIGN   MITRAL STENOSIS/ INSUFFICIENCY, NON-RHEUMATIC with mechanical mitral valve. Coumadin being reversed.   Secondary cardiomyopathy, valvular and nonspecific: Last echocardiogram in September 2014 showed an ejection fraction of 45-50%. No evidence of acute heart failure. Will give IV Lasix between FFP units per   Chronic systolic heart failure, compensated   Pacemaker-CRT-St judes History of atrial fibrillation/flutter   Code Status:  full Family Communication:   Disposition Plan:  home  Consultants:  [facility]  Procedures:     Antibiotics:    HPI/Subjective: Denies shortness of breath, chest pain. Denies bleeding. No weakness. Got up to the bathroom without difficulty.  Objective: Filed Vitals:   03/25/14 0752  BP: 98/65  Pulse: 95  Temp: 98.1 F (36.7 C)  Resp: 18    Intake/Output Summary (Last 24 hours) at 03/25/14 0832 Last data filed at 03/25/14 0535  Gross per 24 hour  Intake    958 ml  Output      0 ml  Net    958 ml   Filed Weights   03/23/14 1443 03/23/14 2031 03/24/14 2053  Weight: 81.279 kg (179 lb 3 oz) 80.287 kg (177 lb) 80.287 kg (177 lb)    Exam:   General:  Alert, oriented, cooperative and comfortable.  Cardiovascular: Regular rate rhythm. Mechanical S1. Systolic murmur  Respiratory: Clear to auscultation bilaterally without wheezes rhonchi  or rales  Abdomen: Soft nontender nondistended normal bowel sounds  Ext: No clubbing cyanosis or edema  Basic Metabolic Panel:  Recent Labs Lab 03/23/14 1452 03/24/14 0051 03/25/14 0628  NA 139 141 141  K 4.5 3.9 3.9  CL 105 105 104  CO2 24 24 20   GLUCOSE 107* 104* 89  BUN 21 19 25*  CREATININE 1.67* 1.54* 1.56*  CALCIUM 8.9 8.7 9.2   Liver Function Tests: No results found for this basename: AST, ALT, ALKPHOS, BILITOT, PROT, ALBUMIN,  in the last 168 hours No results found for this basename: LIPASE, AMYLASE,  in the last 168 hours No results found for this basename: AMMONIA,  in the last 168 hours CBC:  Recent Labs Lab 03/23/14 1452 03/24/14 0051 03/24/14 1131 03/25/14 0628  WBC 6.8 8.0 6.9  --   HGB 6.5* 7.2*  7.3* 8.5* 8.7*  HCT 21.5* 23.7* 26.4* 28.1*  MCV 97.3 92.6 91.3  --   PLT 359 341 317  --    Cardiac Enzymes: No results found for this basename: CKTOTAL, CKMB, CKMBINDEX, TROPONINI,  in the last 168 hours BNP (last 3 results)  Recent Labs  01/17/14 1211 03/23/14 1452  PROBNP 378.6* 269.1*   CBG: No results found for this basename: GLUCAP,  in the last 168 hours  No results found for this or any previous visit (from the past 240 hour(s)).   Studies: Dg Chest 2 View  03/23/2014   CLINICAL DATA:  Shortness of breath. And chest pain with history of CHF and atrial fibrillation.  EXAM: CHEST  2 VIEW  COMPARISON:  Portable chest x-ray of March 12, 2011  FINDINGS: The lungs are adequately inflated. There is no focal infiltrate. The cardiopericardial silhouette is normal in size. The pulmonary vascularity is not engorged. There is a valve ring in the mitral position. The permanent pacemaker is normal in position. There is no pleural effusion or pneumothorax. The bony thorax is unremarkable.  IMPRESSION: There is no acute cardiopulmonary abnormality.   Electronically Signed   By: David  [place]   On: 03/23/2014 16:00    Scheduled Meds: . sodium chloride   10 mL/hr Intravenous Once  . furosemide  20 mg Intravenous Once  . furosemide  20 mg Oral Daily  . pantoprazole (PROTONIX) IV  40 mg Intravenous Q12H  . sodium chloride  3 mL Intravenous Q12H  . sodium chloride  3 mL Intravenous Q12H   Continuous Infusions:   Time spent: 35 minutes  Benjimin Hadden L  [facility] Pager [phone]. If 7PM-7AM, please contact night-coverage at [email or web address], password [facility] 03/25/2014, 8:32 AM  LOS: 2 days

## 2014-03-25 NOTE — Brief Op Note (Signed)
03/23/2014 - 03/25/2014  12:13 PM  PATIENT:  [name]  68 y.o. male  PRE-OPERATIVE DIAGNOSIS:  anemia, melena  POST-OPERATIVE DIAGNOSIS:  normal EGD  PROCEDURE:  Procedure(s): ESOPHAGOGASTRODUODENOSCOPY (EGD) (N/A)  SURGEON:  Surgeon(s) and Role:    * Iva Boop, MD - Primary  Sedation: Benadryl 25 mg IV, Fentanyl 25 ug IV and Versed 2 mg IV  Findings: Normal EGD  Plans:  1) start heparin, will stop 2+ hrs before colonoscopy 2) Colonoscopy tomorrow  Iva Boop, MD, [facility] [phone] (pager) 03/25/2014 12:14 PM

## 2014-03-26 ENCOUNTER — Inpatient Hospital Stay (HOSPITAL_COMMUNITY): Admission: RE | Admit: 2014-03-26 | Payer: Medicare Other | Source: Ambulatory Visit

## 2014-03-26 ENCOUNTER — Encounter (HOSPITAL_COMMUNITY)
Admission: EM | Disposition: A | Discharge status: Home / Self Care | Payer: Self-pay | Source: Home / Self Care | Attending: Internal Medicine

## 2014-03-26 ENCOUNTER — Encounter (HOSPITAL_COMMUNITY): Payer: Self-pay | Admitting: Internal Medicine

## 2014-03-26 ENCOUNTER — Inpatient Hospital Stay (HOSPITAL_COMMUNITY): Payer: Medicare Other | Admitting: Certified Registered Nurse Anesthetist

## 2014-03-26 ENCOUNTER — Encounter (HOSPITAL_COMMUNITY): Payer: Medicare Other | Admitting: Certified Registered Nurse Anesthetist

## 2014-03-26 HISTORY — PX: GIVENS CAPSULE STUDY: SHX5432

## 2014-03-26 HISTORY — PX: COLONOSCOPY: SHX5424

## 2014-03-26 LAB — PREPARE FRESH FROZEN PLASMA
UNIT DIVISION: 0
UNIT DIVISION: 0

## 2014-03-26 LAB — PROTIME-INR
INR: 2.49 — AB (ref 0.00–1.49)
Prothrombin Time: 26.9 seconds — ABNORMAL HIGH (ref 11.6–15.2)

## 2014-03-26 LAB — HEPARIN LEVEL (UNFRACTIONATED): Heparin Unfractionated: 0.41 IU/mL (ref 0.30–0.70)

## 2014-03-26 SURGERY — COLONOSCOPY
Anesthesia: Monitor Anesthesia Care

## 2014-03-26 SURGERY — IMAGING PROCEDURE, GI TRACT, INTRALUMINAL, VIA CAPSULE
Anesthesia: LOCAL

## 2014-03-26 MED ORDER — SODIUM CHLORIDE 0.9 % IV SOLN
INTRAVENOUS | Status: DC | PRN
  Administered 2014-03-26: 11:00:00 via INTRAVENOUS

## 2014-03-26 MED ORDER — SODIUM CHLORIDE 0.9 % IV SOLN
INTRAVENOUS | Status: DC
  Administered 2014-03-27: 20:00:00 via INTRAVENOUS

## 2014-03-26 MED ORDER — PROPOFOL INFUSION 10 MG/ML OPTIME
INTRAVENOUS | Status: DC | PRN
  Administered 2014-03-26: 200 ug/kg/min via INTRAVENOUS

## 2014-03-26 SURGICAL SUPPLY — 1 items: TOWEL COTTON PACK 4EA (MISCELLANEOUS) ×4 IMPLANT

## 2014-03-26 NOTE — Op Note (Signed)
[name] [address] [place], [address]   COLONOSCOPY PROCEDURE REPORT  PATIENT: [name], [name]  MR#: [id number] BIRTHDATE: 1945/10/15 , 68  yrs. old GENDER: male ENDOSCOPIST: Rachael Fee, MD PROCEDURE DATE:  03/26/2014 PROCEDURE:   Colonoscopy, diagnostic First Screening Colonoscopy - Avg.  risk and is 50 yrs.  old or older - No.  Prior Negative Screening - Now for repeat screening. N/A  History of Adenoma - Now for follow-up colonoscopy & has been > or = to 3 yrs.  Yes hx of adenoma.  Has been 3 or more years since last colonoscopy. ASA CLASS:   Class III INDICATIONS:GI bleeding on coumadin; 2009 Colonoscopy found rectal TVA; EGD yesterday was normal. MEDICATIONS: Monitored anesthesia care  DESCRIPTION OF PROCEDURE:   After the risks benefits and alternatives of the procedure were thoroughly explained, informed consent was obtained. Digital exam  revealed no abnormalities of the rectum.   The Pentax Ped Colon [id number]  endoscope was introduced through the anus and advanced to the terminal ileum which was intubated for a short distance. No adverse events experienced.   The quality of the prep was excellent.  The instrument was then slowly withdrawn as the colon was fully examined.   COLON FINDINGS: There were a few diverticulum throughout the colon. There were several flecks of recent red blood throughout the colon, including the cecum.  Brief inspection of terminal ileum also showed small flecks of blood within.  The examination was otherwise normal.  Retroflexed views revealed no abnormalities. The time to cecum=2 minutes 00 seconds.  Withdrawal time=7 minutes 00 seconds. The scope was withdrawn and the procedure completed. COMPLICATIONS: There were no complications.  ENDOSCOPIC IMPRESSION: There were a few diverticulum throughout the colon.  There were several flecks of recent red blood throughout the colon, including the cecum.   Brief inspection of terminal ileum also showed small flecks of blood within.  The examination was otherwise normal  RECOMMENDATIONS: Will proceed with capsule endoscopy, this afternoon if possible. Can restart heparin now.  eSigned:  Rachael Fee, MD 03/26/2014 12:00 PM

## 2014-03-26 NOTE — Progress Notes (Addendum)
[facility] PROGRESS NOTE  [name] [name] DOB: 1946/04/06 DOA: 03/23/2014 PCP: Richard Pax, NP [facility] Summary: 68 year old male admitted with symptomatic anemia and melanotic stools over the past week. On Coumadin for mechanical valve. Has received 2 units of packed red blood cells and 2 units of FFP.   Assessment/Plan:  GI bleed: Hemoglobin improved. I s/p EGD  Appreciate GI- for colonoscopy today  Acute blood loss anemia   HYPERTENSION, BENIGN   MITRAL STENOSIS/ INSUFFICIENCY, NON-RHEUMATIC with mechanical mitral valve. Coumadin being reversed.   Secondary cardiomyopathy, valvular and nonspecific: Last echocardiogram in September 2014 showed an ejection fraction of 45-50%. No evidence of acute heart failure.    Chronic systolic heart failure, compensated   Pacemaker-CRT-St judes-- heparin gtt History of atrial fibrillation/flutter CKD stage III- baseline 1.5  Code Status:  full Family Communication:  No family at bedside Disposition Plan:  Home once medically ready  Consultants:  [facility]  Procedures:     Antibiotics:    HPI/Subjective: No SOB, no CP Getting up fine  Objective: Filed Vitals:   03/26/14 0528  BP: 103/79  Pulse: 95  Temp: 98 F (36.7 C)  Resp: 18    Intake/Output Summary (Last 24 hours) at 03/26/14 0810 Last data filed at 03/26/14 0500  Gross per 24 hour  Intake 2524.76 ml  Output      0 ml  Net 2524.76 ml   Filed Weights   03/23/14 2031 03/24/14 2053 03/25/14 2051  Weight: 80.287 kg (177 lb) 80.287 kg (177 lb) 80.287 kg (177 lb)    Exam:   General:  Alert, oriented, cooperative and comfortable.  Cardiovascular: Regular rate rhythm. Mechanical S1. Systolic murmur  Respiratory: Clear to auscultation bilaterally without wheezes rhonchi or rales  Abdomen: Soft nontender nondistended normal bowel sounds  Ext: No clubbing cyanosis or edema  Basic Metabolic Panel:  Recent Labs Lab  03/23/14 1452 03/24/14 0051 03/25/14 0628  NA 139 141 141  K 4.5 3.9 3.9  CL 105 105 104  CO2 24 24 20   GLUCOSE 107* 104* 89  BUN 21 19 25*  CREATININE 1.67* 1.54* 1.56*  CALCIUM 8.9 8.7 9.2   Liver Function Tests: No results found for this basename: AST, ALT, ALKPHOS, BILITOT, PROT, ALBUMIN,  in the last 168 hours No results found for this basename: LIPASE, AMYLASE,  in the last 168 hours No results found for this basename: AMMONIA,  in the last 168 hours CBC:  Recent Labs Lab 03/23/14 1452 03/24/14 0051 03/24/14 1131 03/25/14 0628  WBC 6.8 8.0 6.9  --   HGB 6.5* 7.2*  7.3* 8.5* 8.7*  HCT 21.5* 23.7* 26.4* 28.1*  MCV 97.3 92.6 91.3  --   PLT 359 341 317  --    Cardiac Enzymes: No results found for this basename: CKTOTAL, CKMB, CKMBINDEX, TROPONINI,  in the last 168 hours BNP (last 3 results)  Recent Labs  01/17/14 1211 03/23/14 1452  PROBNP 378.6* 269.1*   CBG: No results found for this basename: GLUCAP,  in the last 168 hours  No results found for this or any previous visit (from the past 240 hour(s)).   Studies: No results found.  Scheduled Meds: . furosemide  20 mg Oral Daily  . pantoprazole (PROTONIX) IV  40 mg Intravenous Q12H  . sodium chloride  3 mL Intravenous Q12H   Continuous Infusions: . sodium chloride    . sodium chloride 75 mL/hr at 03/26/14 0354  . heparin 1,150 Units/hr (03/25/14 1413)  Time spent: 35 minutes  [name]  [facility] Pager [phone]. If 7PM-7AM, please contact night-coverage at [email or web address], password [facility] 03/26/2014, 8:10 AM  LOS: 3 days

## 2014-03-26 NOTE — Anesthesia Procedure Notes (Signed)
Procedure Name: MAC Date/Time: 03/26/2014 11:33 AM Performed by: Angelica Pou Pre-anesthesia Checklist: Patient identified, Patient being monitored, Emergency Drugs available, Timeout performed and Suction available Patient Re-evaluated:Patient Re-evaluated prior to inductionOxygen Delivery Method: Nasal cannula Preoxygenation: Pre-oxygenation with 100% oxygen Intubation Type: IV induction

## 2014-03-26 NOTE — Progress Notes (Signed)
ANTICOAGULATION CONSULT NOTE - Follow Up Consult  Pharmacy Consult for Heparin Indication: St Jude MVR  No Known Allergies  Patient Measurements: Height: 5\' 8"  (172.7 cm) Weight: 177 lb (80.287 kg) IBW/kg (Calculated) : 68.4 Heparin Dosing Weight: 80 kg  Vital Signs: Temp: 97.9 F (36.6 C) (09/21 1230) Temp src: Oral (09/21 1049) BP: 105/81 mmHg (09/21 1230) Pulse Rate: 94 (09/21 1230)  Labs:  Recent Labs  03/23/14 1452  03/24/14 0051 03/24/14 1131 03/25/14 0628 03/25/14 1441 03/25/14 2102 03/26/14 0938  HGB 6.5*  --  7.2*  7.3* 8.5* 8.7*  --   --   --   HCT 21.5*  --  23.7* 26.4* 28.1*  --   --   --   PLT 359  --  341 317  --   --   --   --   APTT  --   --   --   --   --  40*  --   --   LABPROT  --   < > 36.8* 24.4* 25.0* 22.7*  --  26.9*  INR  --   < > 3.71* 2.20* 2.26* 2.00*  --  2.49*  HEPARINUNFRC  --   --   --   --   --   --  0.43  --   CREATININE 1.67*  --  1.54*  --  1.56*  --   --   --   < > = values in this interval not displayed.  Estimated Creatinine Clearance: 42.6 ml/min (by C-G formula based on Cr of 1.56).   Assessment: [name] who continues on a heparin bridge for hx St Jude MVR while PTA warfarin on hold for GIB work-up. The patient had heparin turned off this morning for colonoscopy which was normal except for a few diverticulum with red flecks of blood. GI okay to resume heparin post-op, planning for capsule endoscopy next for further work-up. Will resume heparin at the previously therapeutic rate. No CBC today - will continue to monitor bleeding and GI work-up closely.  Goal of Therapy:  Heparin level 0.3-0.5 units/ml (in setting of GIB) Monitor platelets by anticoagulation protocol: Yes   Plan:  1. Resume heparin at 1150 units/hr (11.5 ml/hr) 2. Will continue to monitor for any signs/symptoms of bleeding and will follow up with heparin level in 8 hours   Georgina Pillion, PharmD, BCPS Clinical Pharmacist Pager: [phone] 03/26/2014 2:12 PM

## 2014-03-26 NOTE — Progress Notes (Signed)
ANTICOAGULATION CONSULT NOTE - Follow Up Consult  Pharmacy Consult for Heparin Indication: St Jude MVR  No Known Allergies  Patient Measurements: Height: 5\' 8"  (172.7 cm) Weight: 177 lb (80.287 kg) IBW/kg (Calculated) : 68.4 Heparin Dosing Weight: 80 kg  Vital Signs: Temp: 98 F (36.7 C) (09/21 2100) Temp src: Oral (09/21 2100) BP: 116/70 mmHg (09/21 2100) Pulse Rate: 102 (09/21 2100)  Labs:  Recent Labs  03/24/14 0051 03/24/14 1131 03/25/14 0628 03/25/14 1441 03/25/14 2102 03/26/14 0938 03/26/14 2130  HGB 7.2*  7.3* 8.5* 8.7*  --   --   --   --   HCT 23.7* 26.4* 28.1*  --   --   --   --   PLT 341 317  --   --   --   --   --   APTT  --   --   --  40*  --   --   --   LABPROT 36.8* 24.4* 25.0* 22.7*  --  26.9*  --   INR 3.71* 2.20* 2.26* 2.00*  --  2.49*  --   HEPARINUNFRC  --   --   --   --  0.43  --  0.41  CREATININE 1.54*  --  1.56*  --   --   --   --     Estimated Creatinine Clearance: 42.6 ml/min (by C-G formula based on Cr of 1.56).   Assessment: 15 YOM who continues on a heparin bridge for hx St Jude MVR while PTA warfarin on hold for GIB work-up. The patient had heparin turned off this morning for colonoscopy which was normal except for a few diverticulum with red flecks of blood. GI okay to resume heparin post-op, planning for capsule endoscopy next for further work-up. Will resume heparin at the previously therapeutic rate. No CBC today - will continue to monitor bleeding and GI work-up closely.  PM f/u, heparin remains at goal level.  No bleeding noted per chart notes.  Goal of Therapy:  Heparin level 0.3-0.5 units/ml (in setting of GIB) Monitor platelets by anticoagulation protocol: Yes   Plan:  1. Continue heparin at 1150 units/hr (11.5 ml/hr) 2. Will continue to monitor for any signs/symptoms of bleeding. 3. Daily heparin level and CBC. 4. F/u plans to resume oral anticoagulation.   Tad Moore, BCPS  Clinical Pharmacist Pager  [phone]  03/26/2014 10:46 PM

## 2014-03-26 NOTE — Interval H&P Note (Signed)
History and Physical Interval Note:  03/26/2014 11:22 AM  [name]  has presented today for surgery, with the diagnosis of melena, anemia  The various methods of treatment have been discussed with the patient and family. After consideration of risks, benefits and other options for treatment, the patient has consented to  Procedure(s): COLONOSCOPY (N/A) as a surgical intervention .  The patient's history has been reviewed, patient examined, no change in status, stable for surgery.  I have reviewed the patient's chart and labs.  Questions were answered to the patient's satisfaction.     Rachael Fee

## 2014-03-26 NOTE — Progress Notes (Signed)
Patient finished drinking movieprep at 0500 and had 3 watery stools,dark blood,no stool particles noted. Goldia Ligman, Drinda Butts, Charity fundraiser

## 2014-03-26 NOTE — Anesthesia Preprocedure Evaluation (Signed)
Anesthesia Evaluation  Patient identified by MRN, date of birth, ID band Patient awake    Reviewed: Allergy & Precautions, H&P , NPO status , Patient's Chart, lab work & pertinent test results  History of Anesthesia Complications Negative for: history of anesthetic complications  Airway Mallampati: II TM Distance: >3 FB Neck ROM: Full    Dental  (+) Edentulous Upper, Edentulous Lower, Dental Advisory Given   Pulmonary neg pulmonary ROS, former smoker,  breath sounds clear to auscultation  Pulmonary exam normal       Cardiovascular hypertension, +CHF + dysrhythmias + pacemaker Rhythm:Regular  Paced rhythm   Neuro/Psych    GI/Hepatic GERD-  ,  Endo/Other    Renal/GU      Musculoskeletal   Abdominal   Peds  Hematology  (+) anemia ,   Anesthesia Other Findings   Reproductive/Obstetrics                           Anesthesia Physical Anesthesia Plan  ASA: III  Anesthesia Plan: MAC   Post-op Pain Management:    Induction: Intravenous  Airway Management Planned: Nasal Cannula and Natural Airway  Additional Equipment:   Intra-op Plan:   Post-operative Plan:   Informed Consent: I have reviewed the patients History and Physical, chart, labs and discussed the procedure including the risks, benefits and alternatives for the proposed anesthesia with the patient or authorized representative who has indicated his/her understanding and acceptance.     Plan Discussed with: CRNA, Anesthesiologist and Surgeon  Anesthesia Plan Comments:         Anesthesia Quick Evaluation

## 2014-03-26 NOTE — H&P (View-Only) (Signed)
   Consultation  Referring Provider:[facility]      Primary Care Physician:  YOUNG,SARA M, NP Primary Gastroenterologist: Daniel Jacobs, MD         Reason for Consultation:  GI bleed            HPI:   [name] is a 68 y.o. male known remotely to Dr. Jacobs for a history of colon polyps (tubulovillous rectal polyp 2009). Patient has multiple medical problems. He is s/p MVR (on chronic coumadin now) for endocarditis associated with IVDA. Patient presented to ED with chest pain, SOB, and weakness. He reports black stools over last couple of weeks though no stools in 2-3 days. Hgb was 10.8 in mid July, it was 6.5 yesterday. INR 3.8. Patient was started on a baby asa within last few months. He takes alka-seltzer twice a week for indigestion and has done that for years. Patient isn't otherwise familiar with home medications. Prilosec is listed on home meds.   Past Medical History  Diagnosis Date  . 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  a- Echo 07/2008 Ef 55% mild peroprosthetic MVR with High transmitral gradient (mean 14).  b- normal coronaries by cath 12/2007  . Atrial fibrillation     post op.  s/p dc-cv 04/2008. previously on amiodarone  . Atrial flutter     atypical  . Heavy alcohol use     history  . IV drug abuse     history of  . CHF (congestive heart failure)     EF 35-40 % 2011 due to valvular disease and diastolic dysfunction  . History of GI bleed   . Hypertension   . GERD (gastroesophageal reflux disease)   . Chronic back pain   . AV block, 1st degree     .450 msec--progressive  . Dysphagia     with normal barium swallow 09/2008  . Pacemaker 2010    History reviewed. No pertinent past surgical history.  Family History  Problem Relation Age of Onset  . Coronary artery disease Mother      History  Substance Use Topics  . Smoking status: Former Smoker -- 10 years    Types: Cigarettes  . Smokeless tobacco: Never  Used     Comment: ONLY 3 PER DAY AFTER MEALS  . Alcohol Use: No     Comment: quit drinking 2 years ago    Prior to Admission medications   Medication Sig Start Date End Date Taking? Authorizing Provider  aspirin EC 81 MG tablet Take 1 tablet (81 mg total) by mouth daily. 01/17/14  Yes Ali B Cosgrove, NP  carvedilol (COREG) 6.25 MG tablet Take 1 tablet (6.25 mg total) by mouth 2 (two) times daily with a meal. 01/17/14  Yes Ali B Cosgrove, NP  furosemide (LASIX) 20 MG tablet Take 20 mg by mouth daily.   Yes Historical Provider, MD  KLOR-CON M20 20 MEQ tablet Take 1 tablet by mouth Daily. 12/15/10  Yes Historical Provider, MD  lisinopril (PRINIVIL,ZESTRIL) 20 MG tablet Take 20 mg by mouth daily. 01/10/13  Yes Daniel R Bensimhon, MD  omeprazole (PRILOSEC) 20 MG capsule Take 20 mg by mouth daily.     Yes Historical Provider, MD  traMADol (ULTRAM) 50 MG tablet Take 50 mg by mouth every 8 (eight) hours as needed for pain (as needed  for 30 days). 10/27/12  Yes Historical Provider, MD  warfarin (COUMADIN) 5 MG tablet Take 5-7.5   mg by mouth daily. Take 5 mg daily except take 7.5 mg on sun, tue, and thur   Yes Historical Provider, MD    Current Facility-Administered Medications  Medication Dose Route Frequency Provider Last Rate Last Dose  . 0.9 %  sodium chloride infusion  10 mL/hr Intravenous Once Megan Docherty, MD      . 0.9 %  sodium chloride infusion  250 mL Intravenous PRN Ali Hijazi, MD      . acetaminophen (TYLENOL) tablet 650 mg  650 mg Oral Q6H PRN Ali Hijazi, MD       Or  . acetaminophen (TYLENOL) suppository 650 mg  650 mg Rectal Q6H PRN Ali Hijazi, MD      . furosemide (LASIX) tablet 20 mg  20 mg Oral Daily Ali Hijazi, MD   20 mg at 03/23/14 2202  . ondansetron (ZOFRAN) tablet 4 mg  4 mg Oral Q6H PRN Ali Hijazi, MD       Or  . ondansetron (ZOFRAN) injection 4 mg  4 mg Intravenous Q6H PRN Ali Hijazi, MD      . pantoprazole (PROTONIX) injection 40 mg  40 mg Intravenous Q12H Ali Hijazi, MD    40 mg at 03/23/14 2202  . sodium chloride 0.9 % injection 3 mL  3 mL Intravenous Q12H Ali Hijazi, MD   3 mL at 03/23/14 2203  . sodium chloride 0.9 % injection 3 mL  3 mL Intravenous Q12H Ali Hijazi, MD   3 mL at 03/23/14 2203  . sodium chloride 0.9 % injection 3 mL  3 mL Intravenous PRN Ali Hijazi, MD      . traMADol (ULTRAM) tablet 50 mg  50 mg Oral Q8H PRN Ali Hijazi, MD      . zolpidem (AMBIEN) tablet 5 mg  5 mg Oral QHS PRN Ali Hijazi, MD        Allergies as of 03/23/2014  . (No Known Allergies)    Review of Systems:    All systems reviewed and negative except where noted in HPI.   Physical Exam:  Vital signs in last 24 hours: Temp:  [98 F (36.7 C)-99 F (37.2 C)] 99 F (37.2 C) (09/19 0615) Pulse Rate:  [80-102] 95 (09/19 0615) Resp:  [18-24] 19 (09/19 0211) BP: (99-128)/(54-79) 99/59 mmHg (09/19 0615) SpO2:  [96 %-100 %] 98 % (09/19 0615) Weight:  [177 lb (80.287 kg)-179 lb 3 oz (81.279 kg)] 177 lb (80.287 kg) (09/18 2031) Last BM Date: 03/21/14 General:   Black male in NAD Head:  Normocephalic and atraumatic. Eyes:   No icterus.   Conjunctiva pink. Ears:  Normal auditory acuity. Neck:  Supple; no masses felt Lungs:  Respirations even and unlabored. Lungs clear to auscultation bilaterally.   No wheezes, crackles, or rhonchi.  Heart:  Regular rate and rhythm Abdomen:  Soft, nondistended, nontender. Normal bowel sounds. No appreciable masses or hepatomegaly.  Rectal:  Scant amount of light brown residual in vault.   Msk:  Symmetrical without gross deformities.  Extremities:  Without edema. Neurologic:  Alert and  oriented x4;  grossly normal neurologically. Skin:  Intact without significant lesions or rashes. Cervical Nodes:  No significant cervical adenopathy. Psych:  Alert and cooperative. Poor eye contact.  LAB RESULTS:  Recent Labs  03/23/14 1452 03/24/14 0051  WBC 6.8 8.0  HGB 6.5* 7.2*  7.3*  HCT 21.5* 23.7*  PLT 359 341   BMET  Recent Labs   03/23/14 1452 03/24/14 0051  NA 139 141  K   4.5 3.9  CL 105 105  CO2 24 24  GLUCOSE 107* 104*  BUN 21 19  CREATININE 1.67* 1.54*  CALCIUM 8.9 8.7   LFT No results found for this basename: PROT, ALBUMIN, AST, ALT, ALKPHOS, BILITOT, BILIDIR, IBILI,  in the last 72 hours PT/INR  Recent Labs  03/23/14 1717 03/24/14 0051  LABPROT 38.1* 36.8*  INR 3.88* 3.71*    STUDIES: Dg Chest 2 View  03/23/2014   CLINICAL DATA:  Shortness of breath. And chest pain with history of CHF and atrial fibrillation.  EXAM: CHEST  2 VIEW  COMPARISON:  Portable chest x-ray of March 12, 2011  FINDINGS: The lungs are adequately inflated. There is no focal infiltrate. The cardiopericardial silhouette is normal in size. The pulmonary vascularity is not engorged. There is a valve ring in the mitral position. The permanent pacemaker is normal in position. There is no pleural effusion or pneumothorax. The bony thorax is unremarkable.  IMPRESSION: There is no acute cardiopulmonary abnormality.   Electronically Signed   By: David  Jordan   On: 03/23/2014 16:00   PREVIOUS ENDOSCOPIES:             EGD by Dr. Jacobs Aug 2009 for heme + anemia. Normal exam  Colonoscopy Dr. Jacobs Procedure date: 02/24/2008 Evaluation of:  Anemia Positive fecal occult blood test  Extent of exam reached: Cecum, extent intended: Cecum. The cecum was  identified by appendiceal orifice and IC valve. Patient position: on  left side. Colon retroflexion performed.  Findings  - DIVERTICULOSIS: Descending Colon to Sigmoid Colon.  POLYP: Rectum, Maximum size: 20 mm. pedunculated polyp. Procedure:  snare with cautery, The polyp was removed piece meal. removed,  retrieved, Polyp sent to pathology. Path # 1. Comments: Heparin was  help by surgery team earlier today and so it was safe to remove this  fairly large polyp. .  NORMAL EXAM: Cecum to Rectum. Comments: otherwise normal  examination, fair prep limited examination somewhat. Small  lesions  may have been missed.  Assessment:  FAIR PREP LIMITED EXAMINATION, SMALL LESIONS MAY HAVE BEEN MISSED.  LEFT SIDED DIVERTICULOSIS. 2CM POLYP IN DISTAL RECTUM WAS REMOVED  WITH SNARE/CAUTERY. GIVEN THAT HE WAS FOBT + ONLY ON ONE OUT OF  THREE SAMPLES, I DO NOT THINK THAT THIS POLYPS ACCOUNTS FOR HIS  SIGNIFICANT ANEMIA. EGD WAS NORMAL AS WELL. WOULD NOT PROCEED WITH  FURTHER GI WORKUP AT THIS POINT, HE WILL NEED POLYP SURVEILLANCE IN  3 YEARS, DEPENDING ON PATHOLOGY RESULTS.  RECTUM, POLYP: - TUBULOVILLOUS ADENOMATOUS POLYP. - NO HIGH GRADE DYSPLASIA OR INVASIVE MALIGNANCY IDENTIFIED.   Impression / Plan:   1. 68 year old male with heme positive stools, reportedly black stools over last two weeks, and severe normocytic anemia in setting of coumadin and NSAIDS (baby asa + alka setzer). Creatinine chronically elevated, interestingly his BUN is normal. Rule out erosive disease, PUD, AVMs.  Keep NPO. He is s/p FFP, will check check INR now and plan for EGD later today if INR down. If EGD negative then will need further endoscopic workup. He has received 2 units of blood, hgb 7.3 at midnight  (after 1st unit).  2. Coagulopathy. On coumadin for MVP. Will recheck INR.  3. Permanent pacemaker  4. Hx of tubulovillous adenomatous polyps 2009. Patient overdue for surveillance colonoscopy.  4. Hx of ETOH abuse. There was question of cirrhosis at one time. I cannot find any evidence for cirrhosis by labs, imaging or exam.  Thanks   LOS:   1 day   Paula Guenther  03/24/2014, 9:24 AM    [facility] Attending  I have also seen and assessed the patient and agree with the above note. INR should be ok tomorrow for EGD With MVR will want to limit time off A/c tx Still low Txic now  Needs an EGD to evaluate cause of melena The risks and benefits as well as alternatives of endoscopic procedure(s) have been discussed and reviewed. All questions answered. The patient agrees to proceed.  Cathline Dowen  E. Thanya Cegielski, MD, FACG [facility] [phone] (pager) 03/24/2014 4:09 PM   

## 2014-03-26 NOTE — Anesthesia Postprocedure Evaluation (Signed)
  Anesthesia Post-op Note  Patient: [name]  Procedure(s) Performed: Procedure(s): COLONOSCOPY (N/A)  Patient Location: PACU and Endoscopy Unit  Anesthesia Type:MAC  Level of Consciousness: awake, oriented, sedated and patient cooperative  Airway and Oxygen Therapy: Patient Spontanous Breathing  Post-op Pain: none  Post-op Assessment: Post-op Vital signs reviewed, Patient's Cardiovascular Status Stable, Respiratory Function Stable, Patent Airway and No signs of Nausea or vomiting  Post-op Vital Signs: stable  Last Vitals:  Filed Vitals:   03/26/14 1204  BP: 108/61  Pulse: 90  Temp:   Resp: 16    Complications: No apparent anesthesia complications

## 2014-03-26 NOTE — Transfer of Care (Signed)
Immediate Anesthesia Transfer of Care Note  Patient: [name]  Procedure(s) Performed: Procedure(s): COLONOSCOPY (N/A)  Patient Location: PACU  Anesthesia Type:MAC  Level of Consciousness: awake, alert , patient cooperative and lethargic  Airway & Oxygen Therapy: Patient Spontanous Breathing and Patient connected to nasal cannula oxygen  Post-op Assessment: Report given to PACU RN, Post -op Vital signs reviewed and stable and Patient moving all extremities X 4  Post vital signs: Reviewed and stable  Complications: No apparent anesthesia complications

## 2014-03-27 ENCOUNTER — Encounter (HOSPITAL_COMMUNITY): Payer: Self-pay | Admitting: Gastroenterology

## 2014-03-27 LAB — PROTIME-INR
INR: 2.56 — ABNORMAL HIGH (ref 0.00–1.49)
Prothrombin Time: 27.5 seconds — ABNORMAL HIGH (ref 11.6–15.2)

## 2014-03-27 LAB — HEPARIN LEVEL (UNFRACTIONATED)
HEPARIN UNFRACTIONATED: 0.37 [IU]/mL (ref 0.30–0.70)
Heparin Unfractionated: <0.1 IU/mL — ABNORMAL LOW (ref 0.30–0.70)

## 2014-03-27 MED ORDER — PANTOPRAZOLE SODIUM 40 MG PO TBEC
40.0000 mg | DELAYED_RELEASE_TABLET | Freq: Every day | ORAL | Status: DC
  Administered 2014-03-27 – 2014-03-30 (×3): 40 mg via ORAL
  Filled 2014-03-27 (×2): qty 1

## 2014-03-27 MED ORDER — SODIUM CHLORIDE 0.9 % IV SOLN: INTRAVENOUS | Status: DC

## 2014-03-27 NOTE — Progress Notes (Signed)
Daily Rounding Note  03/27/2014, 9:25 AM  LOS: 4 days   SUBJECTIVE:       Feels well.  Feels safe to discharge but would like to know what is causing anemia and bleeding.  No nausea, tolerating solids.  No BMs or bleeding since colonoscopy  Prep   OBJECTIVE:         Vital signs in last 24 hours:    Temp:  [97.9 F (36.6 C)-98.8 F (37.1 C)] 98 F (36.7 C) (09/22 0900) Pulse Rate:  [86-102] 98 (09/22 0900) Resp:  [12-20] 18 (09/22 0536) BP: (101-129)/(53-81) 129/79 mmHg (09/22 0900) SpO2:  [91 %-100 %] 100 % (09/22 0900) Weight:  [78.79 kg (173 lb 11.2 oz)] 78.79 kg (173 lb 11.2 oz) (09/22 0536) Last BM Date: 03/26/14 Filed Weights   03/24/14 2053 03/25/14 2051 03/27/14 0536  Weight: 80.287 kg (177 lb) 80.287 kg (177 lb) 78.79 kg (173 lb 11.2 oz)   General: looks well   Heart: RRR Chest: clear bil  Abdomen: soft, NT, ND, no mass, active BS  Extremities: no CCE Neuro/Psych:  Pleasant, appropriate, relaxed.   Intake/Output from previous day: 09/21 0701 - 09/22 0700 In: 833.5 [I.V.:833.5] Out: -   Intake/Output this shift: Total I/O In: 360 [P.O.:360] Out: -   Lab Results:  Recent Labs  03/24/14 1131 03/25/14 0628  WBC 6.9  --   HGB 8.5* 8.7*  HCT 26.4* 28.1*  PLT 317  --    BMET  Recent Labs  03/25/14 0628  NA 141  K 3.9  CL 104  CO2 20  GLUCOSE 89  BUN 25*  CREATININE 1.56*  CALCIUM 9.2   LFT No results found for this basename: PROT, ALBUMIN, AST, ALT, ALKPHOS, BILITOT, BILIDIR, IBILI,  in the last 72 hours PT/INR  Recent Labs  03/25/14 1441 03/26/14 0938  LABPROT 22.7* 26.9*  INR 2.00* 2.49*   Scheduled Meds: . furosemide  20 mg Oral Daily  . pantoprazole (PROTONIX) IV  40 mg Intravenous Q12H  . sodium chloride  3 mL Intravenous Q12H   Continuous Infusions: . sodium chloride Stopped (03/26/14 1400)  . sodium chloride 20 mL/hr at 03/26/14 1800  . heparin 1,150 Units/hr  (03/26/14 1825)   PRN Meds:.acetaminophen, acetaminophen, ondansetron (ZOFRAN) IV, ondansetron, traMADol, zolpidem   ASSESMENT:   *   GI bleed 9/20 EGD normal 9/21 colonoscopy: scattered tics, scattered flecks of blood in colon and in TI. Capsule endoscopy completed, not yet read.   *  Chronic Coumadin, on hold. On heparin gtt. For hx A fib. INR apex: 3.88 on 9/18.  S/p FFP x 3 on 9/19 - 9/20.   *  Normocytic anemia.  Hgb nadir 6.5 on 9/18, currently stable.  S/p PRBCs x 2.  *  CKD   PLAN   *  Read capsule study, not likely to happen until tomorrow.  *  Stop IV Protonix and resume once daily PPI * ? When ok to restart Coumadin.  *  GI PMD is Dr Christella Hartigan.     Jennye Moccasin  03/27/2014, 9:25 AM Pager: [phone]   ________________________________________________________________________  [name] MD note:  I personally examined the patient, reviewed the data and agree with the assessment and plan described above.  Capsule report still pending.  He's fortunately had no further clinical bleeding on heparin gtts.  For now, stay on heparin.  Will decide about restarting coumadin after capsule report is back.   Reuel Boom  Jacobs, MD [facility] Pager [phone]  

## 2014-03-27 NOTE — Progress Notes (Signed)
[facility] PROGRESS NOTE  [name] OEH:[id number] DOB: December 26, 1945 DOA: 03/23/2014 PCP: Radford Pax, NP [facility] Summary: 68 year old male admitted with symptomatic anemia and melanotic stools over the past week. On Coumadin for mechanical valve. Has received 2 units of packed red blood cells and 2 units of FFP.   Assessment/Plan:  GI bleed: Hemoglobin improved.  s/p EGD  Appreciate GI- s/p colonoscopy and capsule endoscopy  Acute blood loss anemia   HYPERTENSION, BENIGN   MITRAL STENOSIS/ INSUFFICIENCY, NON-RHEUMATIC with mechanical mitral valve. Coumadin being reversed.   Secondary cardiomyopathy, valvular and nonspecific: Last echocardiogram in September 2014 showed an ejection fraction of 45-50%. No evidence of acute heart failure.    Chronic systolic heart failure, compensated   Pacemaker-CRT-St judes-- heparin gtt- restart coumadin History of atrial fibrillation/flutter CKD stage III- baseline 1.5  Code Status:  full Family Communication:  No family at bedside Disposition Plan:  Home once medically ready  Consultants:  [facility]  Procedures:     Antibiotics:    HPI/Subjective: Up walking around room, no issues  Objective: Filed Vitals:   03/27/14 1000  BP: 109/61  Pulse: 96  Temp: 98.8 F (37.1 C)  Resp: 18    Intake/Output Summary (Last 24 hours) at 03/27/14 1129 Last data filed at 03/27/14 0915  Gross per 24 hour  Intake 1193.47 ml  Output      0 ml  Net 1193.47 ml   Filed Weights   03/24/14 2053 03/25/14 2051 03/27/14 0536  Weight: 80.287 kg (177 lb) 80.287 kg (177 lb) 78.79 kg (173 lb 11.2 oz)    Exam:   General:  Alert, oriented, cooperative and comfortable.  Cardiovascular: Regular rate rhythm. Mechanical S1. Systolic murmur  Respiratory: Clear to auscultation bilaterally without wheezes rhonchi or rales  Abdomen: Soft nontender nondistended normal bowel sounds  Ext: No clubbing cyanosis or edema  Basic  Metabolic Panel:  Recent Labs Lab 03/23/14 1452 03/24/14 0051 03/25/14 0628  NA 139 141 141  K 4.5 3.9 3.9  CL 105 105 104  CO2 24 24 20   GLUCOSE 107* 104* 89  BUN 21 19 25*  CREATININE 1.67* 1.54* 1.56*  CALCIUM 8.9 8.7 9.2   Liver Function Tests: No results found for this basename: AST, ALT, ALKPHOS, BILITOT, PROT, ALBUMIN,  in the last 168 hours No results found for this basename: LIPASE, AMYLASE,  in the last 168 hours No results found for this basename: AMMONIA,  in the last 168 hours CBC:  Recent Labs Lab 03/23/14 1452 03/24/14 0051 03/24/14 1131 03/25/14 0628  WBC 6.8 8.0 6.9  --   HGB 6.5* 7.2*  7.3* 8.5* 8.7*  HCT 21.5* 23.7* 26.4* 28.1*  MCV 97.3 92.6 91.3  --   PLT 359 341 317  --    Cardiac Enzymes: No results found for this basename: CKTOTAL, CKMB, CKMBINDEX, TROPONINI,  in the last 168 hours BNP (last 3 results)  Recent Labs  01/17/14 1211 03/23/14 1452  PROBNP 378.6* 269.1*   CBG: No results found for this basename: GLUCAP,  in the last 168 hours  No results found for this or any previous visit (from the past 240 hour(s)).   Studies: No results found.  Scheduled Meds: . furosemide  20 mg Oral Daily  . pantoprazole  40 mg Oral Q0600  . sodium chloride  3 mL Intravenous Q12H   Continuous Infusions: . sodium chloride Stopped (03/26/14 1400)  . sodium chloride 20 mL/hr at 03/26/14 1800  . heparin 1,150  Units/hr (03/26/14 1825)    Time spent: 25 minutes  Marlin Canary  [facility] Pager [phone]. If 7PM-7AM, please contact night-coverage at [email or web address], password [email or web address] 03/27/2014, 11:29 AM  LOS: 4 days

## 2014-03-27 NOTE — Progress Notes (Signed)
Endo picked up capsule belt at this time.

## 2014-03-27 NOTE — Progress Notes (Signed)
On capsule study, pill sat in stomach for many hours but when it entered duodenum, active bleeding seen. Dr Christella Hartigan will be doing entersocopy tomorrow ~ 1430.  Orders placed to hold Heparin after 11AM tomorrow. Discussed with Mr Westerman, who is agreeable to proceed.  He can eat solids tonight and will be NPO after 0500 tomorrow.  Jennye Moccasin

## 2014-03-27 NOTE — Care Management Note (Signed)
CARE MANAGEMENT NOTE 03/27/2014  Patient:  [name], [name]   Account Number:  [id number]  Date Initiated:  03/27/2014  Documentation initiated by:  Calysta Craigo  Subjective/Objective Assessment:   CM following for progression and d/c planning.     Action/Plan:   03/27/2014 Met with pt no d/c needs identified at this time. Pt ambulation in hall with supervison only.   Anticipated DC Date:  03/28/2014   Anticipated DC Plan:           Choice offered to / List presented to:             Status of service:   Medicare Important Message given?  YES (If response is "NO", the following Medicare IM given date fields will be blank) Date Medicare IM given:  03/27/2014 Medicare IM given by:  Moniqua Engebretsen Date Additional Medicare IM given:   Additional Medicare IM given by:    Discharge Disposition:  HOME/SELF CARE  Per UR Regulation:    If discussed at Long Length of Stay Meetings, dates discussed:    Comments:

## 2014-03-27 NOTE — Progress Notes (Signed)
Pt ambulated entire length of hall without difficulty.  Pt tolerated well.  

## 2014-03-27 NOTE — Progress Notes (Signed)
ANTICOAGULATION CONSULT NOTE - Follow Up Consult  Pharmacy Consult for Heparin and Coumadin Indication: St. Jude MVR  No Known Allergies  Patient Measurements: Height: 5\' 8"  (172.7 cm) Weight: 173 lb 11.2 oz (78.79 kg) IBW/kg (Calculated) : 68.4  Vital Signs: Temp: 98.8 F (37.1 C) (09/22 1000) Temp src: Oral (09/22 1000) BP: 109/61 mmHg (09/22 1000) Pulse Rate: 96 (09/22 1000)  Labs:  Recent Labs  03/25/14 0628 03/25/14 1441  03/26/14 0938 03/26/14 2130 03/27/14 0447 03/27/14 1120 03/27/14 1130  HGB 8.7*  --   --   --   --   --   --   --   HCT 28.1*  --   --   --   --   --   --   --   APTT  --  40*  --   --   --   --   --   --   LABPROT 25.0* 22.7*  --  26.9*  --   --  27.5*  --   INR 2.26* 2.00*  --  2.49*  --   --  2.56*  --   HEPARINUNFRC  --   --   < >  --  0.41 <0.10*  --  0.37  CREATININE 1.56*  --   --   --   --   --   --   --   < > = values in this interval not displayed.  Estimated Creatinine Clearance: 42.6 ml/min (by C-G formula based on Cr of 1.56).   Medications:  Heparin 1150 units/hr  Assessment: 68 YOM who continues on a heparin bridge for hx St Jude MVR while PTA warfarin on hold for GIB work-up. Heparin level (0.37) remains therapeutic (level was rechecked as <0.1 was inaccurate lab draw). GI recommends to continue heparin for now and wait to restart Coumadin until after capsule report - discussed with Dr. Benjamine Mola.  - INR up to 2.56 without any Coumadin since 9/16 - Dr. Benjamine Mola to check CMET tomorrow AM to evaluate liver function - No CBC since 9/20 - will order for AM - No bleeding per GI notes  Goal of Therapy:  Heparin level 0.3-0.5 units/ml (in setting of GIB)  Monitor platelets by anticoagulation protocol: Yes   Plan:  1. Continue heparin drip 1150 units/hr (11.5 ml/hr) 2. Follow-up AM heparin level, CBC, INR and LFTs 3. Follow-up capsule report and restart of Coumadin 4. Please clarify INR goal (2.5-3.5?)  Cleon Dew [phone] 03/27/2014,1:30 PM

## 2014-03-28 ENCOUNTER — Ambulatory Visit: Payer: Medicare Other | Admitting: Physician Assistant

## 2014-03-28 ENCOUNTER — Encounter (HOSPITAL_COMMUNITY)
Admission: EM | Disposition: A | Discharge status: Home / Self Care | Payer: Self-pay | Source: Home / Self Care | Attending: Internal Medicine

## 2014-03-28 ENCOUNTER — Encounter (HOSPITAL_COMMUNITY): Payer: Self-pay | Admitting: Physician Assistant

## 2014-03-28 ENCOUNTER — Inpatient Hospital Stay (HOSPITAL_COMMUNITY): Payer: Medicare Other

## 2014-03-28 DIAGNOSIS — Z8774 Personal history of (corrected) congenital malformations of heart and circulatory system: Secondary | ICD-10-CM

## 2014-03-28 DIAGNOSIS — I38 Endocarditis, valve unspecified: Secondary | ICD-10-CM

## 2014-03-28 DIAGNOSIS — K5520 Angiodysplasia of colon without hemorrhage: Secondary | ICD-10-CM

## 2014-03-28 HISTORY — PX: ENTEROSCOPY: SHX5533

## 2014-03-28 LAB — COMPREHENSIVE METABOLIC PANEL
ALK PHOS: 40 U/L (ref 39–117)
ALT: 18 U/L (ref 0–53)
AST: 48 U/L — ABNORMAL HIGH (ref 0–37)
Albumin: 3.9 g/dL (ref 3.5–5.2)
Anion gap: 12 (ref 5–15)
BILIRUBIN TOTAL: 0.8 mg/dL (ref 0.3–1.2)
BUN: 21 mg/dL (ref 6–23)
CHLORIDE: 105 meq/L (ref 96–112)
CO2: 23 meq/L (ref 19–32)
Calcium: 9 mg/dL (ref 8.4–10.5)
Creatinine, Ser: 1.45 mg/dL — ABNORMAL HIGH (ref 0.50–1.35)
GFR calc non Af Amer: 47 mL/min — ABNORMAL LOW (ref >90)
GFR, EST AFRICAN AMERICAN: 55 mL/min — AB (ref >90)
GLUCOSE: 99 mg/dL (ref 70–99)
POTASSIUM: 3.7 meq/L (ref 3.7–5.3)
Sodium: 140 mEq/L (ref 137–147)
Total Protein: 6.6 g/dL (ref 6.0–8.3)

## 2014-03-28 LAB — CBC
HEMATOCRIT: 24.2 % — AB (ref 39.0–52.0)
HEMATOCRIT: 24.5 % — AB (ref 39.0–52.0)
HEMOGLOBIN: 7.7 g/dL — AB (ref 13.0–17.0)
Hemoglobin: 7.8 g/dL — ABNORMAL LOW (ref 13.0–17.0)
MCH: 30.4 pg (ref 26.0–34.0)
MCH: 29.2 pg (ref 26.0–34.0)
MCHC: 32.2 g/dL (ref 30.0–36.0)
MCHC: 31.4 g/dL (ref 30.0–36.0)
MCV: 94.2 fL (ref 78.0–100.0)
MCV: 92.8 fL (ref 78.0–100.0)
Platelets: 328 10*3/uL (ref 150–400)
Platelets: 347 10*3/uL (ref 150–400)
RBC: 2.57 MIL/uL — ABNORMAL LOW (ref 4.22–5.81)
RBC: 2.64 MIL/uL — ABNORMAL LOW (ref 4.22–5.81)
RDW: 19.6 % — AB (ref 11.5–15.5)
RDW: 19.3 % — ABNORMAL HIGH (ref 11.5–15.5)
WBC: 6.1 10*3/uL (ref 4.0–10.5)
WBC: 6.6 10*3/uL (ref 4.0–10.5)

## 2014-03-28 LAB — PROTIME-INR
INR: 2.3 — ABNORMAL HIGH (ref 0.00–1.49)
Prothrombin Time: 25.3 seconds — ABNORMAL HIGH (ref 11.6–15.2)

## 2014-03-28 LAB — HEPATITIS B SURFACE ANTIBODY,QUALITATIVE: Hep B S Ab: NEGATIVE

## 2014-03-28 LAB — HEPARIN LEVEL (UNFRACTIONATED): Heparin Unfractionated: 0.31 IU/mL (ref 0.30–0.70)

## 2014-03-28 LAB — HEPATITIS B SURFACE ANTIGEN: HEP B S AG: NEGATIVE

## 2014-03-28 LAB — HEPATITIS C ANTIBODY: HCV Ab: NEGATIVE

## 2014-03-28 SURGERY — ENTEROSCOPY
Anesthesia: Moderate Sedation

## 2014-03-28 MED ORDER — HEPARIN (PORCINE) IN NACL 100-0.45 UNIT/ML-% IJ SOLN
1100.0000 [IU]/h | INTRAMUSCULAR | Status: DC
  Administered 2014-03-28 – 2014-03-29 (×2): 1150 [IU]/h via INTRAVENOUS
  Filled 2014-03-28 (×5): qty 250

## 2014-03-28 MED ORDER — FENTANYL CITRATE 0.05 MG/ML IJ SOLN
INTRAMUSCULAR | Status: DC | PRN
  Administered 2014-03-28 (×4): 25 ug via INTRAVENOUS

## 2014-03-28 MED ORDER — MIDAZOLAM HCL 5 MG/ML IJ SOLN
INTRAMUSCULAR | Status: AC
  Filled 2014-03-28: qty 2

## 2014-03-28 MED ORDER — FENTANYL CITRATE 0.05 MG/ML IJ SOLN
INTRAMUSCULAR | Status: AC
  Filled 2014-03-28: qty 4

## 2014-03-28 MED ORDER — DEXTROSE 5 % IV SOLN
600.0000 mg | INTRAVENOUS | Status: DC
  Filled 2014-03-28: qty 4

## 2014-03-28 MED ORDER — BUTAMBEN-TETRACAINE-BENZOCAINE 2-2-14 % EX AERO
INHALATION_SPRAY | CUTANEOUS | Status: DC | PRN
  Administered 2014-03-28: 2 via TOPICAL

## 2014-03-28 MED ORDER — DIPHENHYDRAMINE HCL 50 MG/ML IJ SOLN
INTRAMUSCULAR | Status: AC
  Filled 2014-03-28: qty 1

## 2014-03-28 MED ORDER — MIDAZOLAM HCL 10 MG/2ML IJ SOLN
INTRAMUSCULAR | Status: DC | PRN
  Administered 2014-03-28 (×5): 2 mg via INTRAVENOUS

## 2014-03-28 NOTE — Op Note (Signed)
[name] [address] [place], [address]   ENTEROSCOPY PROCEDURE REPORT     EXAM DATE: 04/26/2014  PATIENT NAME:      Richard, Hubbard           MR #:      [id number]  BIRTHDATE:       [birth year]      VISIT #:     [id number] ATTENDING:     Rachael Fee, MD     STATUS:     inpatient ASSISTANT:      Nilsa Nutting and Dwain Sarna  INDICATIONS:  The patient is a 68 yr old male here for an enteroscopy procedure due to control bleeding. PROCEDURE PERFORMED:     Small bowel enteroscopy with control of bleeding MEDICATIONS:     Fentanyl 100 mcg IV and Versed 10 mg IV  CONSENT: The patient understands the risks and benefits of the procedure and understands that these risks include, but are not limited to: sedation, allergic reaction, infection, perforation and/or bleeding. Alternative means of evaluation and treatment include, among others: physical exam, x-rays, and/or surgical intervention. The patient elects to proceed with this endoscopic procedure. DESCRIPTION OF PROCEDURE: During intra-op preparation period all mechanical & medical equipment was checked for proper function. Hand hygiene and appropriate measures for infection prevention was taken. After the risks, benefits and alternatives of the procedure were thoroughly explained, Informed consent was verified, confirmed and timeout was successfully executed by the treatment team. The Pentax VSB-2900 endoscope was introduced through the mouth and advanced to the proximal jejunum jejunum. The prep was The overall prep quality was excellent.. The instrument was then slowly withdrawn while examining the mucosa circumferentially. The scope was then completely withdrawn from the patient and the procedure terminated. The pulse, BP, and O2 saturation were monitored and documented by the physician and the nursing staff throughout the entire procedure.  The patient was cared for as planned according  to standard protocol, then discharged to recovery in stable condition and with appropriate post procedure care.  There was active oozing of blood from a pinpoint of otherwise normal appearing mucosa in the proximal jejunum.  This was treated with placement of 3 endoclips and hemostasis appeared complete.  The examination was otherwise normal.    ADVERSE EVENTS:      There were no complications. IMPRESSIONS:     There was active oozing of blood from a pinpoint of otherwise normal appearing mucosa in the proximal jejunum.  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  This was treated with placement of 3 endoclips and hemostasis appeared complete.  The examination was otherwise normal  RECOMMENDATIONS:     Follow at lesat another 24-48 hours in hospital for rebleeding.  If he does, would consider IR angio using the endoclips as a target for therapy, embolization.  Can restart heparin in 6 hours.  Will start clear liquid diet. CBC in AM.    _____________________________ Rachael Fee, MD eSigned:  Rachael Fee, MD 2014-04-26 3:26 PM      CPT CODES: ICD9 CODES:  The ICD and CPT codes recommended by this software are interpretations from the data that the clinical staff has captured with the software.  The verification of the translation of this report to the ICD and CPT codes and modifiers is the sole responsibility of the health care institution and practicing physician where this report was generated.  [employer]  will not be held responsible for the validity of the ICD and CPT codes included on this report.  AMA assumes no liability for data contained or not contained herein. CPT is a Publishing rights manager of the [employer].   PATIENT NAME:  Richard, Hubbard MR#: [id number]

## 2014-03-28 NOTE — Progress Notes (Signed)
UR completed 

## 2014-03-28 NOTE — Progress Notes (Signed)
          Daily Rounding Note  03/28/2014, 8:51 AM  LOS: 5 days   SUBJECTIVE:       2 stools yesterday, both black and soft.  No nausea, no dizziness or SOB  OBJECTIVE:         Vital signs in last 24 hours:    Temp:  [98 F (36.7 C)-98.8 F (37.1 C)] 98.4 F (36.9 C) (09/23 0521) Pulse Rate:  [90-103] 101 (09/23 0521) Resp:  [16-19] 18 (09/23 0521) BP: (108-129)/(61-79) 108/64 mmHg (09/23 0521) SpO2:  [97 %-100 %] 99 % (09/23 0521) Weight:  [76.658 kg (169 lb)] 76.658 kg (169 lb) (09/22 2218) Last BM Date: 03/26/14 Filed Weights   03/25/14 2051 03/27/14 0536 03/27/14 2218  Weight: 80.287 kg (177 lb) 78.79 kg (173 lb 11.2 oz) 76.658 kg (169 lb)   General: looks well, comfortable, alert   Heart: RRR with valve snap Chest: clear bil.  No labored breathing or cough. Abdomen: soft, NT, ND.  No mass or HSM  Extremities: no CCE Neuro/Psych:  Pleasant, relaxed, no gross deficits.   Intake/Output from previous day: 09/22 0701 - 09/23 0700 In: 1550 [P.O.:840; I.V.:710] Out: 2 [Urine:1; Stool:1]  Intake/Output this shift:    PT/INR  Recent Labs  03/26/14 0938 03/27/14 1120  LABPROT 26.9* 27.5*  INR 2.49* 2.56*     ASSESMENT:   * GI bleed  9/20 EGD normal  9/21 colonoscopy: scattered tics, scattered flecks of blood in colon and in TI.  Capsule endoscopy 9/22: SB bleeding, no official note yet.  * Chronic Coumadin, on hold.  S/p mechanical MVR. Hx A fib. On heparin gtt (on hold this AM for EGD). INR apex: 3.88 on 9/18. S/p FFP x 3 on 9/19 - 9/20.  INR still hanging up * Normocytic anemia. Hgb nadir 6.5 on 9/18, currently stable. S/p PRBCs x 2.  * CKD    PLAN   *  Enteroscopy today at 1430 *  Dr Benjamine Mola is checking LFTs and ultrasound to see if there is any abnormality that might indicate hepatic dysfunction. Has hx IVDA and ETOH abuse. No previous hepatitis screening in Epic so ordered hep B and c testing.      Jennye Moccasin  03/28/2014, 8:51 AM Pager: [phone]

## 2014-03-28 NOTE — Progress Notes (Signed)
ANTICOAGULATION CONSULT NOTE - Follow Up Consult  Pharmacy Consult for Heparin Indication: St Jude MVR  No Known Allergies  Patient Measurements: Height: 5\' 8"  (172.7 cm) Weight: 169 lb (76.658 kg) IBW/kg (Calculated) : 68.4 Heparin Dosing Weight: 80 kg  Vital Signs: Temp: 97.6 F (36.4 C) (09/23 1342) Temp src: Oral (09/23 1342) BP: 110/67 mmHg (09/23 1534) Pulse Rate: 103 (09/23 1534)  Labs:  Recent Labs  03/26/14 0938  03/27/14 0447 03/27/14 1120 03/27/14 1130 03/28/14 0730 03/28/14 1140  HGB  --   --   --   --   --  7.8* 7.7*  HCT  --   --   --   --   --  24.2* 24.5*  PLT  --   --   --   --   --  328 347  LABPROT 26.9*  --   --  27.5*  --  25.3*  --   INR 2.49*  --   --  2.56*  --  2.30*  --   HEPARINUNFRC  --   < > <0.10*  --  0.37 0.31  --   CREATININE  --   --   --   --   --  1.45*  --   < > = values in this interval not displayed.  Estimated Creatinine Clearance: 45.9 ml/min (by C-G formula based on Cr of 1.45).   Assessment: 67 YOM who continues on a heparin bridge for hx St Jude MVR while PTA warfarin on hold for GIB work-up. The heparin level this morning remains therapeutic (HL 0.31 << 0.37).   The patient's capsule study on 9/22 showed active bleeding in the duodenum. Enteroscopy today revealed active oozing of blood from a pinpoint of otherwise normal appearing mucosa in the proximal jejunum. This was treated with placement of 3 endoclips and hemostasis appeared complete. Heparin to resume 6 hours after procedure  Goal of Therapy:  Heparin level 0.3-0.5 units/ml (in setting of GIB) Monitor platelets by anticoagulation protocol: Yes   Plan:  1. Resume heparin at 1150 units/hr (11.5 ml/hr) at 21:30 2. F/u heparin level with am labs. 3. Will follow-up plans to resume warfarin.  Talbert Cage, PharmD Clinical Pharmacist Pager: [phone] 03/28/2014 4:23 PM

## 2014-03-28 NOTE — Progress Notes (Signed)
[facility] PROGRESS NOTE  [name] ERX:[id number] DOB: 08-Jun-1946 DOA: 03/23/2014 PCP: Radford Pax, NP [facility] Summary: 68 year old male admitted with symptomatic anemia and melanotic stools over the past week. On Coumadin for mechanical valve. Has received 2 units of packed red blood cells and 2 units of FFP.  INR has remained high- liver work up in progerss  Assessment/Plan:  GI bleed: Hemoglobin improved.  s/p EGD  Appreciate GI- s/p colonoscopy and capsule endoscopy (stayed in stomach for extended period of time); for enteroscopy today  Increased INR despite not taking coumadin for 1 week- liver enzymes ok, add U/S of liver, add hepatitis panel  Acute blood loss anemia- monitor   HYPERTENSION, BENIGN   MITRAL STENOSIS/ INSUFFICIENCY, NON-RHEUMATIC with mechanical mitral valve. Coumadin being reversed.   Secondary cardiomyopathy, valvular and nonspecific: Last echocardiogram in September 2014 showed an ejection fraction of 45-50%. No evidence of acute heart failure.    Chronic systolic heart failure, compensated   Pacemaker-CRT-St judes-- heparin gtt- restart coumadin History of atrial fibrillation/flutter CKD stage III- baseline 1.5  Code Status:  full Family Communication:  No family at bedside Disposition Plan:  Home once medically ready  Consultants:  [facility]  Procedures:     Antibiotics:    HPI/Subjective: Says he had dark BMs today  Objective: Filed Vitals:   03/28/14 0521  BP: 108/64  Pulse: 101  Temp: 98.4 F (36.9 C)  Resp: 18    Intake/Output Summary (Last 24 hours) at 03/28/14 1015 Last data filed at 03/27/14 2300  Gross per 24 hour  Intake   1190 ml  Output      2 ml  Net   1188 ml   Filed Weights   03/25/14 2051 03/27/14 0536 03/27/14 2218  Weight: 80.287 kg (177 lb) 78.79 kg (173 lb 11.2 oz) 76.658 kg (169 lb)    Exam:   General:  Alert, oriented, cooperative and comfortable.  Cardiovascular: Regular  rate rhythm. Mechanical S1. Systolic murmur  Respiratory: Clear to auscultation bilaterally without wheezes rhonchi or rales  Abdomen: Soft nontender nondistended normal bowel sounds  Ext: No clubbing cyanosis or edema  Basic Metabolic Panel:  Recent Labs Lab 03/23/14 1452 03/24/14 0051 03/25/14 0628 03/28/14 0730  NA 139 141 141 140  K 4.5 3.9 3.9 3.7  CL 105 105 104 105  CO2 24 24 20 23   GLUCOSE 107* 104* 89 99  BUN 21 19 25* 21  CREATININE 1.67* 1.54* 1.56* 1.45*  CALCIUM 8.9 8.7 9.2 9.0   Liver Function Tests:  Recent Labs Lab 03/28/14 0730  AST 48*  ALT 18  ALKPHOS 40  BILITOT 0.8  PROT 6.6  ALBUMIN 3.9   No results found for this basename: LIPASE, AMYLASE,  in the last 168 hours No results found for this basename: AMMONIA,  in the last 168 hours CBC:  Recent Labs Lab 03/23/14 1452 03/24/14 0051 03/24/14 1131 03/25/14 0628 03/28/14 0730  WBC 6.8 8.0 6.9  --  6.1  HGB 6.5* 7.2*  7.3* 8.5* 8.7* 7.8*  HCT 21.5* 23.7* 26.4* 28.1* 24.2*  MCV 97.3 92.6 91.3  --  94.2  PLT 359 341 317  --  328   Cardiac Enzymes: No results found for this basename: CKTOTAL, CKMB, CKMBINDEX, TROPONINI,  in the last 168 hours BNP (last 3 results)  Recent Labs  01/17/14 1211 03/23/14 1452  PROBNP 378.6* 269.1*   CBG: No results found for this basename: GLUCAP,  in the last 168 hours  No results found for this or any previous visit (from the past 240 hour(s)).   Studies: No results found.  Scheduled Meds: . furosemide  20 mg Oral Daily  . pantoprazole  40 mg Oral Q0600  . sodium chloride  3 mL Intravenous Q12H   Continuous Infusions: . sodium chloride Stopped (03/26/14 1400)  . sodium chloride 20 mL/hr at 03/27/14 2009  . sodium chloride    . heparin 1,150 Units/hr (03/27/14 2008)    Time spent: 25 minutes  Marlin Canary  [facility] Pager [phone]. If 7PM-7AM, please contact night-coverage at [email or web address], password [facility] 03/28/2014, 10:15 AM   LOS: 5 days

## 2014-03-28 NOTE — Progress Notes (Signed)
Pt returned from testing, monitor replaced. Dwaine with CMT notified. Will continue to monitor.

## 2014-03-28 NOTE — H&P (View-Only) (Signed)
Daily Rounding Note  03/27/2014, 9:25 AM  LOS: 4 days   SUBJECTIVE:       Feels well.  Feels safe to discharge but would like to know what is causing anemia and bleeding.  No nausea, tolerating solids.  No BMs or bleeding since colonoscopy  Prep   OBJECTIVE:         Vital signs in last 24 hours:    Temp:  [97.9 F (36.6 C)-98.8 F (37.1 C)] 98 F (36.7 C) (09/22 0900) Pulse Rate:  [86-102] 98 (09/22 0900) Resp:  [12-20] 18 (09/22 0536) BP: (101-129)/(53-81) 129/79 mmHg (09/22 0900) SpO2:  [91 %-100 %] 100 % (09/22 0900) Weight:  [78.79 kg (173 lb 11.2 oz)] 78.79 kg (173 lb 11.2 oz) (09/22 0536) Last BM Date: 03/26/14 Filed Weights   03/24/14 2053 03/25/14 2051 03/27/14 0536  Weight: 80.287 kg (177 lb) 80.287 kg (177 lb) 78.79 kg (173 lb 11.2 oz)   General: looks well   Heart: RRR Chest: clear bil  Abdomen: soft, NT, ND, no mass, active BS  Extremities: no CCE Neuro/Psych:  Pleasant, appropriate, relaxed.   Intake/Output from previous day: 09/21 0701 - 09/22 0700 In: 833.5 [I.V.:833.5] Out: -   Intake/Output this shift: Total I/O In: 360 [P.O.:360] Out: -   Lab Results:  Recent Labs  03/24/14 1131 03/25/14 0628  WBC 6.9  --   HGB 8.5* 8.7*  HCT 26.4* 28.1*  PLT 317  --    BMET  Recent Labs  03/25/14 0628  NA 141  K 3.9  CL 104  CO2 20  GLUCOSE 89  BUN 25*  CREATININE 1.56*  CALCIUM 9.2   LFT No results found for this basename: PROT, ALBUMIN, AST, ALT, ALKPHOS, BILITOT, BILIDIR, IBILI,  in the last 72 hours PT/INR  Recent Labs  03/25/14 1441 03/26/14 0938  LABPROT 22.7* 26.9*  INR 2.00* 2.49*   Scheduled Meds: . furosemide  20 mg Oral Daily  . pantoprazole (PROTONIX) IV  40 mg Intravenous Q12H  . sodium chloride  3 mL Intravenous Q12H   Continuous Infusions: . sodium chloride Stopped (03/26/14 1400)  . sodium chloride 20 mL/hr at 03/26/14 1800  . heparin 1,150 Units/hr  (03/26/14 1825)   PRN Meds:.acetaminophen, acetaminophen, ondansetron (ZOFRAN) IV, ondansetron, traMADol, zolpidem   ASSESMENT:   *   GI bleed 9/20 EGD normal 9/21 colonoscopy: scattered tics, scattered flecks of blood in colon and in TI. Capsule endoscopy completed, not yet read.   *  Chronic Coumadin, on hold. On heparin gtt. For hx A fib. INR apex: 3.88 on 9/18.  S/p FFP x 3 on 9/19 - 9/20.   *  Normocytic anemia.  Hgb nadir 6.5 on 9/18, currently stable.  S/p PRBCs x 2.  *  CKD   PLAN   *  Read capsule study, not likely to happen until tomorrow.  *  Stop IV Protonix and resume once daily PPI * ? When ok to restart Coumadin.  *  GI PMD is Dr Christella Hartigan.     [name]  03/27/2014, 9:25 AM Pager: [phone]   ________________________________________________________________________  [name] MD note:  I personally examined the patient, reviewed the data and agree with the assessment and plan described above.  Capsule report still pending.  He's fortunately had no further clinical bleeding on heparin gtts.  For now, stay on heparin.  Will decide about restarting coumadin after capsule report is back.   Reuel Boom  Christella Hartigan, MD [facility] Pager [phone]

## 2014-03-28 NOTE — Interval H&P Note (Signed)
History and Physical Interval Note:  03/28/2014 2:28 PM  [name]  has presented today for surgery, with the diagnosis of bleeding in small bowel  The various methods of treatment have been discussed with the patient and family. After consideration of risks, benefits and other options for treatment, the patient has consented to  Procedure(s): ENTEROSCOPY (N/A) as a surgical intervention .  The patient's history has been reviewed, patient examined, no change in status, stable for surgery.  I have reviewed the patient's chart and labs.  Questions were answered to the patient's satisfaction.     Rachael Fee

## 2014-03-28 NOTE — Progress Notes (Signed)
ANTICOAGULATION CONSULT NOTE - Follow Up Consult  Pharmacy Consult for Heparin Indication: St Jude MVR  No Known Allergies  Patient Measurements: Height: 5\' 8"  (172.7 cm) Weight: 169 lb (76.658 kg) IBW/kg (Calculated) : 68.4 Heparin Dosing Weight: 80 kg  Vital Signs: Temp: 98.4 F (36.9 C) (09/23 0521) BP: 108/64 mmHg (09/23 0521) Pulse Rate: 101 (09/23 0521)  Labs:  Recent Labs  03/25/14 1441  03/26/14 0938  03/27/14 0447 03/27/14 1120 03/27/14 1130 03/28/14 0730  HGB  --   --   --   --   --   --   --  7.8*  HCT  --   --   --   --   --   --   --  24.2*  PLT  --   --   --   --   --   --   --  328  APTT 40*  --   --   --   --   --   --   --   LABPROT 22.7*  --  26.9*  --   --  27.5*  --  25.3*  INR 2.00*  --  2.49*  --   --  2.56*  --  2.30*  HEPARINUNFRC  --   < >  --   < > <0.10*  --  0.37 0.31  CREATININE  --   --   --   --   --   --   --  1.45*  < > = values in this interval not displayed.  Estimated Creatinine Clearance: 45.9 ml/min (by C-G formula based on Cr of 1.45).   Assessment: 41 YOM who continues on a heparin bridge for hx St Jude MVR while PTA warfarin on hold for GIB work-up. The heparin level this morning remains therapeutic (HL 0.31 << 0.37).   The patient's capsule study on 9/22 showed active bleeding int he duodenum. GI is planning to do an enteroscopy today at 1430, Heparin is to be turned off at 1100 (discussed with RN this morning - aware).  Discussed the patient with GI Clarita Leber) and [facility]-MD Benjamine Mola) - heparin will be resumed post-procedure given the patient's history of mechanical MVR. Plans to resume warfarin will be determined pending the results of the procedure.   The patient's rise in INR despite holding warfarin has also been discussed with the team. LFTs were checked this AM - AST was slightly elevated, ALT wnl. MD planning to get hepatitis panel and imaging for further evaluation. INR starting to trend down today (2.3 << 2.56)  Goal of  Therapy:  Heparin level 0.3-0.5 units/ml (in setting of GIB) Monitor platelets by anticoagulation protocol: Yes   Plan:  1. Continue heparin at 1150 units/hr (11.5 ml/hr) 2. Heparin to be turned off at 1100 for enteroscopy planned for 1430 - RN aware of stop time 3. Will follow-up restart of heparin post-procedure and possible plans to resume warfarin.  Georgina Pillion, PharmD, BCPS Clinical Pharmacist Pager: [phone] 03/28/2014 10:31 AM

## 2014-03-29 ENCOUNTER — Encounter (HOSPITAL_COMMUNITY): Payer: Self-pay | Admitting: Gastroenterology

## 2014-03-29 ENCOUNTER — Telehealth: Payer: Self-pay

## 2014-03-29 DIAGNOSIS — Q2733 Arteriovenous malformation of digestive system vessel: Secondary | ICD-10-CM

## 2014-03-29 DIAGNOSIS — K284 Chronic or unspecified gastrojejunal ulcer with hemorrhage: Secondary | ICD-10-CM

## 2014-03-29 LAB — CBC
HEMATOCRIT: 22.3 % — AB (ref 39.0–52.0)
HEMOGLOBIN: 7.2 g/dL — AB (ref 13.0–17.0)
MCH: 29.9 pg (ref 26.0–34.0)
MCHC: 32.3 g/dL (ref 30.0–36.0)
MCV: 92.5 fL (ref 78.0–100.0)
Platelets: 329 10*3/uL (ref 150–400)
RBC: 2.41 MIL/uL — AB (ref 4.22–5.81)
RDW: 19.4 % — AB (ref 11.5–15.5)
WBC: 5.4 10*3/uL (ref 4.0–10.5)

## 2014-03-29 LAB — HEPARIN LEVEL (UNFRACTIONATED): Heparin Unfractionated: 0.34 IU/mL (ref 0.30–0.70)

## 2014-03-29 LAB — PROTIME-INR
INR: 1.88 — AB (ref 0.00–1.49)
Prothrombin Time: 21.6 seconds — ABNORMAL HIGH (ref 11.6–15.2)

## 2014-03-29 MED ORDER — WARFARIN SODIUM 3 MG PO TABS
3.0000 mg | ORAL_TABLET | Freq: Once | ORAL | Status: AC
  Administered 2014-03-29: 3 mg via ORAL
  Filled 2014-03-29 (×2): qty 1

## 2014-03-29 MED ORDER — WARFARIN - PHARMACIST DOSING INPATIENT: Freq: Every day | Status: DC

## 2014-03-29 NOTE — Progress Notes (Signed)
ANTICOAGULATION CONSULT NOTE - Follow Up Consult  Pharmacy Consult for Heparin Indication: St Jude MVR  No Known Allergies  Patient Measurements: Height: 5\' 8"  (172.7 cm) Weight: 171 lb 4.8 oz (77.701 kg) IBW/kg (Calculated) : 68.4 Heparin Dosing Weight: 80 kg  Vital Signs: Temp: 98.6 F (37 C) (09/24 1007) Temp src: Oral (09/24 1007) BP: 101/54 mmHg (09/24 1007) Pulse Rate: 100 (09/24 1007)  Labs:  Recent Labs  03/27/14 1120 03/27/14 1130  03/28/14 0730 03/28/14 1140 03/29/14 0745  HGB  --   --   < > 7.8* 7.7* 7.2*  HCT  --   --   --  24.2* 24.5* 22.3*  PLT  --   --   --  328 347 329  LABPROT 27.5*  --   --  25.3*  --  21.6*  INR 2.56*  --   --  2.30*  --  1.88*  HEPARINUNFRC  --  0.37  --  0.31  --  0.34  CREATININE  --   --   --  1.45*  --   --   < > = values in this interval not displayed.  Estimated Creatinine Clearance: 45.9 ml/min (by C-G formula based on Cr of 1.45).   Assessment: 38 YOM who continues on a heparin bridge for hx St Jude MVR while PTA warfarin on hold for GIB work-up. Heparin was held for an enteroscopy yesterday which showed active bleeding from the proximal jejunum that was clipped and hemostasis achieved. Heparin was resumed ~6 hours post-procedure and the heparin level this morning is therapeutic (HL 0.34, goal of 0.3-0.7).   The patient's rise in INR despite holding warfarin has also been discussed with the team. LFTs, hepatitis panel, and imaging were checked - all negative for cirrhosis or infection. INR is now starting to trend down and plans are to resume warfarin this evening (INR 1.88 << 2.33, goal of 2-3). Will start at a lower dose than the patient was known to be on PTA given the unknown rise in INR.   Goal of Therapy:  Heparin level 0.3-0.5 units/ml (in setting of GIB) Monitor platelets by anticoagulation protocol: Yes   Plan:  1. Continue heparin at 1150 units/hr (11.5 ml/hr) 2. Warfarin 3 mg x 1 dose at 1800 today 3. Will  continue to monitor for any signs/symptoms of bleeding and will follow up with heparin level and PT/INR in the a.m.   Georgina Pillion, PharmD, BCPS Clinical Pharmacist Pager: [phone] 03/29/2014 1:23 PM

## 2014-03-29 NOTE — Telephone Encounter (Signed)
Waiting for appt spots, schedule not out yet.

## 2014-03-29 NOTE — Progress Notes (Addendum)
[facility] PROGRESS NOTE  [name] WUJ:[id number] DOB: April 24, 1946 DOA: 03/23/2014 PCP: Radford Pax, NP [facility] Summary: 68 year old male admitted with symptomatic anemia and melanotic stools over the past week. On Coumadin for mechanical valve. Has received 2 units of packed red blood cells and 2 units of FFP.  INR has remained high- liver appears intact.  Heme onc referral as outpatient  Assessment/Plan:  GI bleed: Hemoglobin improved.  s/p EGD  Appreciate GI- s/p colonoscopy and capsule endoscopy (stayed in stomach for extended period of time) Enteroscopy: There was active oozing of blood from a pinpoint of otherwise normal appearing mucosa in the proximal jejunum.  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 This was treated with placement of 3 endoclips and hemostasis appeared complete  Increased INR despite not taking coumadin for 1 week- liver enzymes ok, U/S of liver did not show cirrhosis, hepatitis panel negative- may need heme onc follow up as outpatient  Acute blood loss anemia- monitor   HYPERTENSION, BENIGN   MITRAL STENOSIS/ INSUFFICIENCY, NON-RHEUMATIC with mechanical mitral valve. Coumadin being resumed   Secondary cardiomyopathy, valvular and nonspecific: Last echocardiogram in September 2014 showed an ejection fraction of 45-50%. No evidence of acute heart failure.    Chronic systolic heart failure, compensated   Pacemaker-CRT-St judes-- heparin gtt- restart coumadin when OK with GI History of atrial fibrillation/flutter CKD stage III- baseline 1.5  Code Status:  full Family Communication:  No family at bedside Disposition Plan:  Home once medically ready  Consultants:  [facility]  Procedures:     Antibiotics:    HPI/Subjective: No recent BMs per patient  Objective: Filed Vitals:   03/29/14 0534  BP: 93/63  Pulse: 101  Temp: 98.4 F (36.9 C)  Resp: 16    Intake/Output Summary (Last 24 hours) at 03/29/14  0920 Last data filed at 03/29/14 0700  Gross per 24 hour  Intake   1596 ml  Output      0 ml  Net   1596 ml   Filed Weights   03/27/14 0536 03/27/14 2218 03/28/14 2028  Weight: 78.79 kg (173 lb 11.2 oz) 76.658 kg (169 lb) 77.701 kg (171 lb 4.8 oz)    Exam:   General:  Alert, oriented, cooperative and comfortable.  Cardiovascular: Regular rate rhythm. Mechanical S1. Systolic murmur  Respiratory: Clear to auscultation bilaterally without wheezes rhonchi or rales  Abdomen: Soft nontender nondistended normal bowel sounds  Ext: No clubbing cyanosis or edema  Basic Metabolic Panel:  Recent Labs Lab 03/23/14 1452 03/24/14 0051 03/25/14 0628 03/28/14 0730  NA 139 141 141 140  K 4.5 3.9 3.9 3.7  CL 105 105 104 105  CO2 GLUCOSE 107* 104* 89 99  BUN 21 19 25* 21  CREATININE 1.67* 1.54* 1.56* 1.45*  CALCIUM 8.9 8.7 9.2 9.0   Liver Function Tests:  Recent Labs Lab 03/28/14 0730  AST 48*  ALT 18  ALKPHOS 40  BILITOT 0.8  PROT 6.6  ALBUMIN 3.9   No results found for this basename: LIPASE, AMYLASE,  in the last 168 hours No results found for this basename: AMMONIA,  in the last 168 hours CBC:  Recent Labs Lab 03/24/14 0051 03/24/14 1131 03/25/14 0628 03/28/14 0730 03/28/14 1140 03/29/14 0745  WBC 8.0 6.9  --  6.1 6.6 5.4  HGB 7.2*  7.3* 8.5* 8.7* 7.8* 7.7* 7.2*  HCT 23.7* 26.4* 28.1* 24.2* 24.5* 22.3*  MCV 92.6 91.3  --  94.2 92.8 92.5  PLT 341 317  --  328 347 329   Cardiac Enzymes: No results found for this basename: CKTOTAL, CKMB, CKMBINDEX, TROPONINI,  in the last 168 hours BNP (last 3 results)  Recent Labs  01/17/14 1211 03/23/14 1452  PROBNP 378.6* 269.1*   CBG: No results found for this basename: GLUCAP,  in the last 168 hours  No results found for this or any previous visit (from the past 240 hour(s)).   Studies: US Abdomen Complete  03/28/2014   CLINICAL DATA:  Cirrhosis.  EXAM: ULTRASOUND ABDOMEN COMPLETE  COMPARISON:   Multiple prior CT scans.  FINDINGS: Gallbladder:  Echogenic gallstones are noted in the gallbladder. The largest measures 1.1 cm. No gallbladder wall thickening, pericholecystic fluid or sonographic Murphy sign to suggest acute cholecystitis.  Common bile duct:  Diameter: 2.8 mm  Liver:  Normal echogenicity without focal lesion. A simple cyst is noted in the right hepatic lobe and measures 8 x 9 x 7 mm. No intrahepatic biliary dilatation.  IVC:  Normal caliber.  Pancreas:  Not visualized due to poor sonographic window.  Spleen:  Normal size and echogenicity without focal lesions.  Right Kidney:  Length: 8.8 cm. Normal renal cortical thickness and echogenicity without focal lesions or hydronephrosis.  Left Kidney:  Length: 10.2 cm. Normal renal cortical thickness and echogenicity without focal lesions or hydronephrosis.  Abdominal aorta:  Normal caliber.  Other findings:  None.  IMPRESSION: No obvious sonographic changes of cirrhosis involving the liver. No worrisome hepatic lesions or intrahepatic biliary dilatation.  Cholelithiasis without sonographic findings for acute cholecystitis.  Poor visualization of the pancreas.   Electronically Signed   By: Loralie Champagne M.D.   On: 03/28/2014 14:12    Scheduled Meds: . furosemide  20 mg Oral Daily  . pantoprazole  40 mg Oral Q0600  . sodium chloride  3 mL Intravenous Q12H   Continuous Infusions: . sodium chloride 75 mL/hr at 03/29/14 0204  . heparin 1,150 Units/hr (03/28/14 2159)    Time spent: 25 minutes  [name]  [facility] Pager [phone]. If 7PM-7AM, please contact night-coverage at [email or web address], password [facility] 03/29/2014, 9:20 AM  LOS: 6 days

## 2014-03-29 NOTE — Telephone Encounter (Signed)
Message copied by Donata Duff on Thu Mar 29, 2014  1:07 PM ------      Message from: Rob Bunting P      Created: Thu Mar 29, 2014 12:06 PM       He will be leaving hosp in next 1-2 days, needs rov with me in 6-8 weeks with cbc the day prior.  Thanks       ------

## 2014-03-29 NOTE — Progress Notes (Signed)
Daily Rounding Note  03/29/2014, 8:34 AM  LOS: 6 days   SUBJECTIVE:       No BMs since Enteroscopy yesterday.  Feels well.  No dizziness.  Ambulating without problems.  Still on clears.  No nause  OBJECTIVE:         Vital signs in last 24 hours:    Temp:  [97.5 F (36.4 C)-98.7 F (37.1 C)] 98.4 F (36.9 C) (09/24 0534) Pulse Rate:  [88-106] 101 (09/24 0534) Resp:  [15-30] 16 (09/24 0534) BP: (93-130)/(50-85) 93/63 mmHg (09/24 0534) SpO2:  [98 %-100 %] 98 % (09/24 0534) Weight:  [77.701 kg (171 lb 4.8 oz)] 77.701 kg (171 lb 4.8 oz) (09/23 2028) Last BM Date: 03/26/14 Filed Weights   03/27/14 0536 03/27/14 2218 03/28/14 2028  Weight: 78.79 kg (173 lb 11.2 oz) 76.658 kg (169 lb) 77.701 kg (171 lb 4.8 oz)   General: looks well.  comfortable   Heart: RRR with valve snap Chest: clear bil  Abdomen: soft, NT, ND.  No mass, no bruits  Extremities: no CCE Neuro/Psych:  Pleasant, relaxed, cooperative.   Intake/Output from previous day: 09/23 0701 - 09/24 0700 In: 1596 [I.V.:1596] Out: 0   Intake/Output this shift:    Lab Results:  Recent Labs  03/28/14 0730 03/28/14 1140  WBC 6.1 6.6  HGB 7.8* 7.7*  HCT 24.2* 24.5*  PLT 328 347   BMET  Recent Labs  03/28/14 0730  NA 140  K 3.7  CL 105  CO2 23  GLUCOSE 99  BUN 21  CREATININE 1.45*  CALCIUM 9.0   LFT  Recent Labs  03/28/14 0730  PROT 6.6  ALBUMIN 3.9  AST 48*  ALT 18  ALKPHOS 40  BILITOT 0.8   PT/INR  Recent Labs  03/27/14 1120 03/28/14 0730  LABPROT 27.5* 25.3*  INR 2.56* 2.30*   Hepatitis Panel  Recent Labs  03/28/14 1140  HEPBSAG NEGATIVE  HCVAB NEGATIVE    Studies/Results: US Abdomen Complete  03/28/2014   CLINICAL DATA:  Cirrhosis.  EXAM: ULTRASOUND ABDOMEN COMPLETE  COMPARISON:  Multiple prior CT scans.  FINDINGS: Gallbladder:  Echogenic gallstones are noted in the gallbladder. The largest measures 1.1 cm. No  gallbladder wall thickening, pericholecystic fluid or sonographic Murphy sign to suggest acute cholecystitis.  Common bile duct:  Diameter: 2.8 mm  Liver:  Normal echogenicity without focal lesion. A simple cyst is noted in the right hepatic lobe and measures 8 x 9 x 7 mm. No intrahepatic biliary dilatation.  IVC:  Normal caliber.  Pancreas:  Not visualized due to poor sonographic window.  Spleen:  Normal size and echogenicity without focal lesions.  Right Kidney:  Length: 8.8 cm. Normal renal cortical thickness and echogenicity without focal lesions or hydronephrosis.  Left Kidney:  Length: 10.2 cm. Normal renal cortical thickness and echogenicity without focal lesions or hydronephrosis.  Abdominal aorta:  Normal caliber.  Other findings:  None.  IMPRESSION: No obvious sonographic changes of cirrhosis involving the liver. No worrisome hepatic lesions or intrahepatic biliary dilatation.  Cholelithiasis without sonographic findings for acute cholecystitis.  Poor visualization of the pancreas.   Electronically Signed   By: Loralie Champagne M.D.   On: 03/28/2014 14:12   Scheduled Meds: . furosemide  20 mg Oral Daily  . pantoprazole  40 mg Oral Q0600  . sodium chloride  3 mL Intravenous Q12H   Continuous Infusions: . sodium chloride 75 mL/hr at 03/29/14 0204  .  heparin 1,150 Units/hr (03/28/14 2159)   PRN Meds:.acetaminophen, acetaminophen, ondansetron (ZOFRAN) IV, ondansetron, traMADol, zolpidem  ASSESMENT:   * GI bleed  9/20 EGD normal  9/21 colonoscopy: scattered tics, scattered flecks of blood in colon and in TI.  Capsule endoscopy 9/22: SB bleeding. 03/28/14 enteroscopy: clipping of area of blood oozing in proximal jejunum (likely a Dieulofoy's) * Chronic Coumadin, on hold. S/p mechanical MVR. Hx A fib. On heparin gtt. INR apex: 3.88 on 9/18. S/p FFP x 3 on 9/19 - 9/20.  INR still hanging up  * Normocytic anemia, ABL anemia. Hgb nadir 6.5 on 9/18, currently stable. S/p PRBCs x 2, last on 9/19.    Hgb stable.  * CKD    PLAN   *  Ok to restart Coumadin. I stopped his IVF.  On daily CBC schedule. Not inclined to give additional RBCs given lack of sxs.  *  Once daily PPI, ? Duration of therapy (6 weeks?)?    [name]  03/29/2014, 8:34 AM Pager: [phone]  ________________________________________________________________________  [name] MD note:  I personally examined the patient, reviewed the data and agree with the assessment and plan described above.  The bleeding has stopped clinically and appeared hemostatic by the end of the Enteroscopy yesterday.  OK to continue on PPI once daily for now.  My office will contact him about a return visit in 6-8 weeks with CBC the day prior.  Can resume full anticoagulation. I see there are question about his liver, perhaps causing INR to remain elevated. He has normal liver contour on recent US, normal platelets, normal bili and so I think it is very unlikely that he has underlying, signficant liver disease (cirrhosis).   Rob Bunting, MD [facility] Pager [phone]

## 2014-03-30 LAB — CBC
HCT: 22.2 % — ABNORMAL LOW (ref 39.0–52.0)
HEMOGLOBIN: 7.1 g/dL — AB (ref 13.0–17.0)
MCH: 29.1 pg (ref 26.0–34.0)
MCHC: 32 g/dL (ref 30.0–36.0)
MCV: 91 fL (ref 78.0–100.0)
Platelets: 325 10*3/uL (ref 150–400)
RBC: 2.44 MIL/uL — AB (ref 4.22–5.81)
RDW: 19.3 % — ABNORMAL HIGH (ref 11.5–15.5)
WBC: 5.5 10*3/uL (ref 4.0–10.5)

## 2014-03-30 LAB — HEPARIN LEVEL (UNFRACTIONATED): Heparin Unfractionated: 0.62 IU/mL (ref 0.30–0.70)

## 2014-03-30 LAB — PREPARE RBC (CROSSMATCH)

## 2014-03-30 LAB — PROTIME-INR
INR: 1.62 — AB (ref 0.00–1.49)
Prothrombin Time: 19.2 seconds — ABNORMAL HIGH (ref 11.6–15.2)

## 2014-03-30 MED ORDER — TRAMADOL HCL 50 MG PO TABS: 50.0000 mg | ORAL_TABLET | Freq: Three times a day (TID) | ORAL | Status: DC | PRN

## 2014-03-30 MED ORDER — LISINOPRIL 20 MG PO TABS: 10.0000 mg | ORAL_TABLET | Freq: Every day | ORAL | Status: DC

## 2014-03-30 MED ORDER — ZOLPIDEM TARTRATE 5 MG PO TABS: 5.0000 mg | ORAL_TABLET | Freq: Every evening | ORAL | Status: DC | PRN

## 2014-03-30 MED ORDER — WARFARIN SODIUM 5 MG PO TABS: 5.0000 mg | ORAL_TABLET | Freq: Every day | ORAL | Status: DC

## 2014-03-30 MED ORDER — ENOXAPARIN SODIUM 80 MG/0.8ML ~~LOC~~ SOLN: 75.0000 mg | Freq: Two times a day (BID) | SUBCUTANEOUS | Status: DC

## 2014-03-30 MED ORDER — WARFARIN SODIUM 5 MG PO TABS
5.0000 mg | ORAL_TABLET | Freq: Once | ORAL | Status: AC
  Administered 2014-03-30: 5 mg via ORAL
  Filled 2014-03-30: qty 1

## 2014-03-30 MED ORDER — ENOXAPARIN SODIUM 80 MG/0.8ML ~~LOC~~ SOLN
75.0000 mg | Freq: Once | SUBCUTANEOUS | Status: AC
  Administered 2014-03-30: 75 mg via SUBCUTANEOUS
  Filled 2014-03-30: qty 0.8

## 2014-03-30 MED ORDER — SODIUM CHLORIDE 0.9 % IV SOLN
Freq: Once | INTRAVENOUS | Status: AC
  Administered 2014-03-30: 12:00:00 via INTRAVENOUS

## 2014-03-30 MED ORDER — ENOXAPARIN SODIUM 80 MG/0.8ML ~~LOC~~ SOLN
75.0000 mg | Freq: Two times a day (BID) | SUBCUTANEOUS | Status: DC
  Filled 2014-03-30: qty 0.8

## 2014-03-30 NOTE — Progress Notes (Signed)
Patient Discharge:  Disposition: Pt discharged home  Education: Pt educated on medications and medication administration. Pt successfully demonstrated administration on Lovenox injection. Pt educated on diagnosis and all discharge instructions. Given handouts. Pt verbalized understanding.   IV: L Forearm IV discontinued.  Telemetry: Removed  Follow-up appointments: Pt informed of need for follow up appointments.   Prescriptions: Scripts given to pt  Transportation: Pt transported home via car by friend  Belongings:All belongings taken with pt

## 2014-03-30 NOTE — Discharge Instructions (Signed)
Enoxaparin injection  What is this medicine?  ENOXAPARIN (ee nox a PA rin) is used after knee, hip, or abdominal surgeries to prevent blood clotting. It is also used to treat existing blood clots in the lungs or in the veins.  This medicine may be used for other purposes; ask your health care provider or pharmacist if you have questions.  COMMON BRAND NAME(S): Lovenox  What should I tell my health care provider before I take this medicine?  They need to know if you have any of these conditions:  -bleeding disorders, hemorrhage, or hemophilia  -infection of the heart or heart valves  -kidney or liver disease  -previous stroke  -prosthetic heart valve  -recent surgery or delivery of a baby  -ulcer in the stomach or intestine, diverticulitis, or other bowel disease  -an unusual or allergic reaction to enoxaparin, heparin, pork or pork products, other medicines, foods, dyes, or preservatives  -pregnant or trying to get pregnant  -breast-feeding  How should I use this medicine?  This medicine is for injection under the skin. It is usually given by a health-care professional. You or a family member may be trained on how to give the injections. If you are to give yourself injections, make sure you understand how to use the syringe, measure the dose if necessary, and give the injection. To avoid bruising, do not rub the site where this medicine has been injected. Do not take your medicine more often than directed. Do not stop taking except on the advice of your doctor or health care professional.  Make sure you receive a puncture-resistant container to dispose of the needles and syringes once you have finished with them. Do not reuse these items. Return the container to your doctor or health care professional for proper disposal.  Talk to your pediatrician regarding the use of this medicine in children. Special care may be needed.  Overdosage: If you think you have taken too much of this medicine contact a poison control  center or emergency room at once.  NOTE: This medicine is only for you. Do not share this medicine with others.  What if I miss a dose?  If you miss a dose, take it as soon as you can. If it is almost time for your next dose, take only that dose. Do not take double or extra doses.  What may interact with this medicine?  -aspirin and aspirin-like medicines  -certain medicines that treat or prevent blood clots  -dipyridamole  -NSAIDs, medicines for pain and inflammation, like ibuprofen or naproxen  This list may not describe all possible interactions. Give your health care provider a list of all the medicines, herbs, non-prescription drugs, or dietary supplements you use. Also tell them if you smoke, drink alcohol, or use illegal drugs. Some items may interact with your medicine.  What should I watch for while using this medicine?  Visit your doctor or health care professional for regular checks on your progress. Your condition will be monitored carefully while you are receiving this medicine.  Notify your doctor or health care professional and seek emergency treatment if you develop breathing problems; changes in vision; chest pain; severe, sudden headache; pain, swelling, warmth in the leg; trouble speaking; sudden numbness or weakness of the face, arm, or leg. These can be signs that your condition has gotten worse.  If you are going to have surgery, tell your doctor or health care professional that you are taking this medicine.  Do not stop taking   this medicine without first talking to your doctor. Be sure to refill your prescription before you run out of medicine.  Avoid sports and activities that might cause injury while you are using this medicine. Severe falls or injuries can cause unseen bleeding. Be careful when using sharp tools or knives. Consider using an electric razor. Take special care brushing or flossing your teeth. Report any injuries, bruising, or red spots on the skin to your doctor or health care  professional.  What side effects may I notice from receiving this medicine?  Side effects that you should report to your doctor or health care professional as soon as possible:  -allergic reactions like skin rash, itching or hives, swelling of the face, lips, or tongue  -feeling faint or lightheaded, falls  -signs and symptoms of bleeding such as bloody or black, tarry stools; red or dark-brown urine; spitting up blood or brown material that looks like coffee grounds; red spots on the skin; unusual bruising or bleeding from the eye, gums, or nose  Side effects that usually do not require medical attention (report to your doctor or health care professional if they continue or are bothersome):  -pain, redness, or irritation at site where injected  This list may not describe all possible side effects. Call your doctor for medical advice about side effects. You may report side effects to FDA at 1-800-FDA-1088.  Where should I keep my medicine?  Keep out of the reach of children.  Store at room temperature between 15 and 30 degrees C (59 and 86 degrees F). Do not freeze. If your injections have been specially prepared, you may need to store them in the refrigerator. Ask your pharmacist. Throw away any unused medicine after the expiration date.  NOTE: This sheet is a summary. It may not cover all possible information. If you have questions about this medicine, talk to your doctor, pharmacist, or health care provider.  © 2015, [employer]. (2013-10-24 16:06:21)

## 2014-03-30 NOTE — Progress Notes (Addendum)
ANTICOAGULATION CONSULT NOTE - Follow Up Consult  Pharmacy Consult for Heparin Indication: St Jude MVR  No Known Allergies  Patient Measurements: Height: 5\' 8"  (172.7 cm) Weight: 167 lb 12.8 oz (76.114 kg) IBW/kg (Calculated) : 68.4 Heparin Dosing Weight: 80 kg  Vital Signs: Temp: 98.4 F (36.9 C) (09/25 0535) Temp src: Oral (09/25 0535) BP: 105/51 mmHg (09/25 0535) Pulse Rate: 94 (09/25 0535)  Labs:  Recent Labs  03/28/14 0730 03/28/14 1140 03/29/14 0745 03/30/14 0635  HGB 7.8* 7.7* 7.2* 7.1*  HCT 24.2* 24.5* 22.3* 22.2*  PLT 328 347 329 325  LABPROT 25.3*  --  21.6* 19.2*  INR 2.30*  --  1.88* 1.62*  HEPARINUNFRC 0.31  --  0.34 0.62  CREATININE 1.45*  --   --   --     Estimated Creatinine Clearance: 45.9 ml/min (by C-G formula based on Cr of 1.45).   Assessment: 40 YOM who continues on a heparin bridge for hx St Jude MVR while PTA warfarin on hold for GIB work-up. Enteroscopy on 9/23 showed showed active bleeding from the proximal jejunum that was clipped and hemostasis achieved. Per GI notes, bleeding has stopped clinically - heparin resumed 9/23, warfarin resumed 9/24.   The patient's rise in INR despite holding warfarin has also been discussed with the team. LFTs, hepatitis panel, and imaging were checked - all negative for cirrhosis or infection. INR started to trend down and warfarin was resumed on 9/24 evening at a lower dose than the patient was known to be on PTA given the unknown rise in INR.  Heparin level today is slightly SUPRAtherapeutic of lower goal range with recent GIB (HL 0.62, goal of 0.3-0.5). INR today remains SUBthereapeutic since warfarin was just resumed on 9/24 (INR 1.62 << 1.88, goal of 2.5-3.5)  Goal of Therapy:  Heparin level 0.3-0.5 units/ml INR 2.5-3.5  Monitor platelets by anticoagulation protocol: Yes   Plan:  1. Reduce heparin slightly to 1100 units/hr (11 ml/hr) 2. Warfarin 5 mg x 1 dose at 1800 today 3. Will continue to  monitor for any signs/symptoms of bleeding and will follow up with heparin level in 8 hours and PT/INR in the a.m.   Georgina Pillion, PharmD, BCPS Clinical Pharmacist Pager: [phone] 03/30/2014 8:23 AM    ------------------------------------------------------------------------------------------ Addendum:  The patient is to be discharged home today and the physician has asked that the patient be transitioned to Lovenox for bridging.  Upon discharge would recommend warfarin 5 mg daily with a PT/INR check on Monday, 9/28 to guide further doses.   Plan 1. Stop heparin drip 2. Initiate Lovenox 75 mg SQ every 12 hours (starting 1 hour after heparin drip discontinued) 3. Continue with warfarin 5 mg today 4. Will monitor renal function for lovenox, signs/symptoms of bleeding, and INR  Georgina Pillion, PharmD, BCPS Clinical Pharmacist Pager: [phone] 03/30/2014 10:24 AM

## 2014-03-31 LAB — TYPE AND SCREEN
ABO/RH(D): O POS
Antibody Screen: NEGATIVE
UNIT DIVISION: 0

## 2014-04-02 NOTE — Telephone Encounter (Signed)
appt scheduled and letter mailed to the pt with appt date and time as well as labs needed.

## 2014-04-03 NOTE — Discharge Summary (Signed)
Physician Discharge Summary  Richard Dillonddie T Hubbard UJW:[id number] DOB: 07-25-1945 DOA: 03/23/2014  PCP: Radford PaxYOUNG,SARA M, NP  Admit date: 03/23/2014 Discharge date: 04/03/2014  Recommendations for Outpatient Follow-up:  1. Pt will need to follow up with PCP in 2-3 weeks post discharge 2. Please obtain BMP to evaluate electrolytes and kidney function 3. Please also check CBC to evaluate Hg and Hct levels 4. Please note that pt has received on e unit of blood prior to discharge 5. Please also note that pt will continue taking coumadin 5 mg PO QD and needs to have PT/INR checked Monday Sept 28th, 2015 so that the dose of Coumadin can be adjusted appropriately  6. Please note that pt educated on how to take Lovenox injections for purpose of bridging with Coumadin (80 mg BID) 7. Please also note that Coreg was stopped due to hypotension can be restarted if needed  Discharge Diagnoses:  Principal Problem:   GI bleed Active Problems:   HYPERTENSION, BENIGN   MITRAL STENOSIS/ INSUFFICIENCY, NON-RHEUMATIC   Secondary cardiomyopathy, valvular and nonspecific   Chronic systolic heart failure   Pacemaker-CRT-St judes   Acute blood loss anemia   GI AVM (gastrointestinal arteriovenous vascular malformation)  Discharge Condition: Stable  Diet recommendation: Heart healthy diet discussed in details with restrictions due to coumadin   History of present illness:  68 year old male admitted with symptomatic anemia and melanotic stools over the past week. On Coumadin for mechanical valve. Has received 2 units of packed red blood cells and 2 units of FFP. INR has remained high- liver appears intact.   Assessment/Plan:  Acute on chronic blood loss anemia - secondary to GI bleed - s/p colonoscopy and capsule endoscopy (stayed in stomach for extended period of time)  - s/p endoscopy --> there was active oozing of blood from a pinpoint of otherwise normal appearing mucosa in the proximal jejunum. 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 This was treated with placement of 3 endoclips and hemostasis appeared complete  - please note that pt has received one unit of blood prior to discharge in addition to blood transfusion upon admission  - will need H/H upon follow up with PCP  Increased INR despite not taking coumadin for 1 week - liver enzymes ok, U/S of liver did not show cirrhosis, hepatitis panel negative  HYPERTENSION - soft BP on discharge and coreg held  MITRAL STENOSIS/ INSUFFICIENCY, NON-RHEUMATIC with mechanical mitral valve.  - pt insisting on going home and will therefore need Lovenox bridging with coumadin  - pt educated on the administration of Lovenox   Secondary cardiomyopathy, valvular and nonspecific:  - Last echocardiogram in September 2014 showed an ejection fraction of 45-50%. No evidence of acute heart failure.   Chronic systolic heart failure - compensated   Pacemaker-CRT-St judes  History of atrial fibrillation/flutter  - stable while inpatient   CKD stage III - baseline 1.5   Consultants:   Crisp GI   Procedures/Studies: Dg Chest 2 View  03/23/2014   There is no acute cardiopulmonary abnormality.  [place] Abdomen Complete  03/28/2014  No obvious sonographic changes of cirrhosis involving the liver. No worrisome hepatic lesions or intrahepatic biliary dilatation.  Cholelithiasis without sonographic findings for acute cholecystitis.  Poor visualization of the pancreas.    Discharge Exam: Filed Vitals:   03/30/14 1806  BP: 112/75  Pulse: 95  Temp: 98.8 F (37.1 C)  Resp:    Filed Vitals:   03/30/14 1539 03/30/14 1636 03/30/14  1732 03/30/14 1806  BP: 103/65 105/55 104/55 112/75  Pulse: 98 93 100 95  Temp: 98.5 F (36.9 C) 98.5 F (36.9 C) 98.3 F (36.8 C) 98.8 F (37.1 C)  TempSrc: Oral Oral Oral Oral  Resp:      Height:      Weight:      SpO2: 100% 99% 100% 100%    General: Pt is alert, follows commands appropriately, not in acute  distress Cardiovascular: Regular rate and rhythm, no rubs, no gallops Respiratory: Clear to auscultation bilaterally, no wheezing, no crackles, no rhonchi Abdominal: Soft, non tender, non distended, bowel sounds +, no guarding  Discharge Instructions      Discharge Instructions   Diet - low sodium heart healthy    Complete by:  As directed      Increase activity slowly    Complete by:  As directed             Medication List    STOP taking these medications       aspirin EC 81 MG tablet     carvedilol 6.25 MG tablet  Commonly known as:  COREG      TAKE these medications       enoxaparin 80 MG/0.8ML injection  Commonly known as:  LOVENOX  Inject 0.75 mLs (75 mg total) into the skin every 12 (twelve) hours.     furosemide 20 MG tablet  Commonly known as:  LASIX  Take 20 mg by mouth daily.     KLOR-CON M20 20 MEQ tablet  Generic drug:  potassium chloride SA  Take 1 tablet by mouth Daily.     lisinopril 20 MG tablet  Commonly known as:  PRINIVIL,ZESTRIL  Take 0.5 tablets (10 mg total) by mouth daily.     omeprazole 20 MG capsule  Commonly known as:  PRILOSEC  Take 20 mg by mouth daily.     traMADol 50 MG tablet  Commonly known as:  ULTRAM  Take 1 tablet (50 mg total) by mouth every 8 (eight) hours as needed.     warfarin 5 MG tablet  Commonly known as:  COUMADIN  Take 1 tablet (5 mg total) by mouth daily. Take 5 mg daily, have PT/INR checked Monday Sept 28th, 2015     zolpidem 5 MG tablet  Commonly known as:  AMBIEN  Take 1 tablet (5 mg total) by mouth at bedtime as needed for sleep.       Follow-up Information   Schedule an appointment as soon as possible for a visit with Radford Pax, NP.   Specialty:  Family Medicine      Follow up with Debbora Presto, MD. (As needed call my cell phone [phone])    Specialty:  Internal Medicine   Contact information:   [address] [place] [address] [phone]        The  results of significant diagnostics from this hospitalization (including imaging, microbiology, ancillary and laboratory) are listed below for reference.     Microbiology: No results found for this or any previous visit (from the past 240 hour(s)).   Labs: Basic Metabolic Panel:  Recent Labs Lab 03/28/14 0730  NA 140  K 3.7  CL 105  CO2 23  GLUCOSE 99  BUN 21  CREATININE 1.45*  CALCIUM 9.0   Liver Function Tests:  Recent Labs Lab 03/28/14 0730  AST 48*  ALT 18  ALKPHOS 40  BILITOT 0.8  PROT 6.6  ALBUMIN 3.9  CBC:  Recent Labs Lab 03/28/14 0730 03/28/14 1140 03/29/14 0745 03/30/14 0635  WBC 6.1 6.6 5.4 5.5  HGB 7.8* 7.7* 7.2* 7.1*  HCT 24.2* 24.5* 22.3* 22.2*  MCV 94.2 92.8 92.5 91.0  PLT 328 347 329 325    BNP (last 3 results)  Recent Labs  01/17/14 1211 03/23/14 1452  PROBNP 378.6* 269.1*   SIGNED: Time coordinating discharge: Over 30 minutes  Debbora Presto, MD  [facility] 04/03/2014, 4:44 PM Pager [phone]  If 7PM-7AM, please contact night-coverage [email or web address] Password [email or web address]

## 2014-04-06 ENCOUNTER — Encounter: Payer: Self-pay | Admitting: Gastroenterology

## 2014-04-24 ENCOUNTER — Other Ambulatory Visit (HOSPITAL_COMMUNITY): Payer: Self-pay | Admitting: Adult Health

## 2014-05-07 ENCOUNTER — Other Ambulatory Visit: Payer: Self-pay | Admitting: Internal Medicine

## 2014-05-11 ENCOUNTER — Other Ambulatory Visit (HOSPITAL_COMMUNITY): Payer: Self-pay | Admitting: *Deleted

## 2014-05-14 ENCOUNTER — Encounter (HOSPITAL_COMMUNITY)
Admission: RE | Admit: 2014-05-14 | Discharge: 2014-05-14 | Disposition: A | Discharge status: Home / Self Care | Payer: Medicare Other | Source: Ambulatory Visit | Attending: Nephrology | Admitting: Nephrology

## 2014-05-14 DIAGNOSIS — D631 Anemia in chronic kidney disease: Secondary | ICD-10-CM | POA: Diagnosis present

## 2014-05-14 DIAGNOSIS — N183 Chronic kidney disease, stage 3 (moderate): Secondary | ICD-10-CM | POA: Diagnosis not present

## 2014-05-14 MED ORDER — SODIUM CHLORIDE 0.9 % IV SOLN
1020.0000 mg | Freq: Once | INTRAVENOUS | Status: AC
  Administered 2014-05-14: 1020 mg via INTRAVENOUS
  Filled 2014-05-14: qty 34

## 2014-05-31 ENCOUNTER — Other Ambulatory Visit: Payer: Self-pay | Admitting: Internal Medicine

## 2014-06-04 ENCOUNTER — Encounter: Payer: Self-pay | Admitting: Gastroenterology

## 2014-06-04 ENCOUNTER — Other Ambulatory Visit (INDEPENDENT_AMBULATORY_CARE_PROVIDER_SITE_OTHER): Payer: Medicare Other

## 2014-06-04 ENCOUNTER — Ambulatory Visit (INDEPENDENT_AMBULATORY_CARE_PROVIDER_SITE_OTHER): Payer: Medicare Other | Admitting: Gastroenterology

## 2014-06-04 VITALS — BP 96/64 | HR 84 | Ht 66.75 in | Wt 175.1 lb

## 2014-06-04 DIAGNOSIS — K922 Gastrointestinal hemorrhage, unspecified: Secondary | ICD-10-CM

## 2014-06-04 LAB — CBC WITH DIFFERENTIAL/PLATELET
Basophils Absolute: 0 10*3/uL (ref 0.0–0.1)
Basophils Relative: 0.6 % (ref 0.0–3.0)
EOS ABS: 0.2 10*3/uL (ref 0.0–0.7)
Eosinophils Relative: 2.2 % (ref 0.0–5.0)
HCT: 35.6 % — ABNORMAL LOW (ref 39.0–52.0)
HEMOGLOBIN: 11.5 g/dL — AB (ref 13.0–17.0)
Lymphocytes Relative: 28 % (ref 12.0–46.0)
Lymphs Abs: 2 10*3/uL (ref 0.7–4.0)
MCHC: 32.3 g/dL (ref 30.0–36.0)
MCV: 87.9 fl (ref 78.0–100.0)
MONO ABS: 0.9 10*3/uL (ref 0.1–1.0)
Monocytes Relative: 12.5 % — ABNORMAL HIGH (ref 3.0–12.0)
NEUTROS ABS: 4.1 10*3/uL (ref 1.4–7.7)
Neutrophils Relative %: 56.7 % (ref 43.0–77.0)
Platelets: 342 10*3/uL (ref 150.0–400.0)
RBC: 4.05 Mil/uL — AB (ref 4.22–5.81)
RDW: 18.5 % — ABNORMAL HIGH (ref 11.5–15.5)
WBC: 7.3 10*3/uL (ref 4.0–10.5)

## 2014-06-04 NOTE — Patient Instructions (Addendum)
You will have labs checked today in the basement lab.  Please head down after you check out with the front desk  (cbc). Call back if you have problems with Gi bleeding.

## 2014-06-04 NOTE — Progress Notes (Signed)
Review of pertinent gastrointestinal problems: 1. 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 Leone Payor EGD normal; 9/21 Dr. Christella Hartigan colonoscopy: scattered tics, scattered flecks of blood in colon and in TI. 9/22 Capsule endoscopy:SB bleeding. 03/28/14 Dr. Christella Hartigan enteroscopy: clipping of area of blood oozing in proximal jejunum (likely a Dieulofoy's) 2. Precancerous polyps, 2009 TVA, Dr. Christella Hartigan   HPI: This is a very pleasant 68 year old man whom I last saw he was hospitalized for GI bleed, see above.  Stools no longer black.  Back on coumadin. Feeling overall pretty.   Takes no nsaids.  Past Medical History  Diagnosis Date  . 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  a- Echo 07/2008 Ef 55% mild peroprosthetic MVR with High transmitral gradient (mean 14).  b- normal coronaries by cath 12/2007  . Atrial fibrillation     post op.  s/p dc-cv 04/2008. previously on amiodarone  . Atrial flutter     atypical  . Heavy alcohol use     history  . IV drug abuse     history of  . CHF (congestive heart failure)     EF 35-40 % 2011 due to valvular disease and diastolic dysfunction  . History of GI bleed   . Hypertension   . GERD (gastroesophageal reflux disease)   . Chronic back pain   . AV block, 1st degree     .450 msec--progressive  . Dysphagia     with normal barium swallow 09/2008  . Pacemaker 2010    Past Surgical History  Procedure Laterality Date  . Pacemaker insertion      inserted about 4-5 years ago  . Cardiac valve replacement      mitral valve, st jude mechanical   . Esophagogastroduodenoscopy N/A 03/25/2014    Procedure: ESOPHAGOGASTRODUODENOSCOPY (EGD);  Surgeon: Iva Boop, MD;  Location[facility] ENDOSCOPY;  Service: Endoscopy;  Laterality: N/A;  . Colonoscopy N/A 03/26/2014    Procedure: COLONOSCOPY;  Surgeon: Rachael Fee, MD;  Location: [facility] ENDOSCOPY;  Service: Endoscopy;  Laterality: N/A;  . Givens capsule study N/A 03/26/2014    Procedure: GIVENS CAPSULE STUDY;  Surgeon: Rachael Fee, MD;  Location: [facility] ENDOSCOPY;  Service: Endoscopy;  Laterality: N/A;  . Enteroscopy N/A 03/28/2014    Procedure: ENTEROSCOPY;  Surgeon: Rachael Fee, MD;  Location: [facility] ENDOSCOPY;  Service: Endoscopy;  Laterality: N/A;      Allergies as of 06/04/2014  . (No Known Allergies)    Family History  Problem Relation Age of Onset  . Coronary artery disease Mother     History   Social History  . Marital Status: Divorced    Spouse Name: N/A    Number of Children: 21  . Years of Education: N/A   Occupational History  . retired    Social History Main Topics  . Smoking status: Former Smoker -- 10 years    Types: Cigarettes  . Smokeless tobacco: Never Used     Comment: ONLY 3 PER DAY AFTER MEALS  . Alcohol Use: No     Comment: quit drinking 2 years ago  . Drug Use: Not on file  . Sexual Activity: Not on file   Other Topics Concern  . Not on file   Social History Narrative   Exercise everyday riding a bike for 1 hour      Physical Exam:  Ht 5' 6.75" (1.695 m)  Wt 175 lb 2 oz (79.436 kg)  BMI 27.65 kg/m2 Constitutional: generally well-appearing Psychiatric: alert and oriented x3 Abdomen: soft, nontender, nondistended, no obvious ascites, no peritoneal signs, normal bowel sounds     Assessment and plan: 68 y.o. male with resolved GI bleeding  He had a small AVM in his proximal jejunum that was bleeding about 2 months ago. This responded to endoscopic clipping very well. She has been back on his Coumadin without any signs of recurrent GI bleeding. He understands that if he does have further bleeding he should call this office. He will have a repeat CBC today to make sure his blood counts have returned to normal.

## 2014-08-09 ENCOUNTER — Encounter: Payer: Self-pay | Admitting: *Deleted

## 2014-08-14 DIAGNOSIS — Z0000 Encounter for general adult medical examination without abnormal findings: Secondary | ICD-10-CM | POA: Diagnosis not present

## 2014-08-14 DIAGNOSIS — Z954 Presence of other heart-valve replacement: Secondary | ICD-10-CM | POA: Diagnosis not present

## 2014-08-14 DIAGNOSIS — Z950 Presence of cardiac pacemaker: Secondary | ICD-10-CM | POA: Diagnosis not present

## 2014-08-14 DIAGNOSIS — R413 Other amnesia: Secondary | ICD-10-CM | POA: Diagnosis not present

## 2014-08-27 ENCOUNTER — Other Ambulatory Visit (HOSPITAL_COMMUNITY): Payer: Self-pay | Admitting: Cardiology

## 2014-08-27 MED ORDER — FUROSEMIDE 20 MG PO TABS: 20.0000 mg | ORAL_TABLET | Freq: Every day | ORAL | Status: DC

## 2014-09-05 ENCOUNTER — Ambulatory Visit (INDEPENDENT_AMBULATORY_CARE_PROVIDER_SITE_OTHER): Payer: Medicare Other | Admitting: *Deleted

## 2014-09-05 DIAGNOSIS — I440 Atrioventricular block, first degree: Secondary | ICD-10-CM | POA: Diagnosis not present

## 2014-09-05 DIAGNOSIS — I5022 Chronic systolic (congestive) heart failure: Secondary | ICD-10-CM | POA: Diagnosis not present

## 2014-09-05 LAB — MDC_IDC_ENUM_SESS_TYPE_INCLINIC
Battery Remaining Longevity: 58.8 mo
Battery Voltage: 2.9 V
Brady Statistic RA Percent Paced: 0.19 %
Brady Statistic RV Percent Paced: 89 %
Date Time Interrogation Session: 20160302100136
Implantable Pulse Generator Model: 3210
Lead Channel Impedance Value: 462.5 Ohm
Lead Channel Pacing Threshold Amplitude: 0.5 V
Lead Channel Pacing Threshold Amplitude: 0.75 V
Lead Channel Pacing Threshold Amplitude: 0.875 V
Lead Channel Pacing Threshold Pulse Width: 0.4 ms
Lead Channel Pacing Threshold Pulse Width: 0.4 ms
Lead Channel Pacing Threshold Pulse Width: 1 ms
Lead Channel Setting Pacing Amplitude: 2 V
Lead Channel Setting Pacing Amplitude: 2 V
Lead Channel Setting Sensing Sensitivity: 2 mV
MDC IDC MSMT LEADCHNL LV IMPEDANCE VALUE: 662.5 Ohm
MDC IDC MSMT LEADCHNL RA IMPEDANCE VALUE: 362.5 Ohm
MDC IDC MSMT LEADCHNL RA PACING THRESHOLD AMPLITUDE: 0.75 V
MDC IDC MSMT LEADCHNL RA SENSING INTR AMPL: 5 mV
MDC IDC MSMT LEADCHNL RV PACING THRESHOLD PULSEWIDTH: 0.4 ms
MDC IDC MSMT LEADCHNL RV SENSING INTR AMPL: 12 mV
MDC IDC PG SERIAL: 2665541
MDC IDC SET LEADCHNL LV PACING PULSEWIDTH: 1 ms
MDC IDC SET LEADCHNL RV PACING AMPLITUDE: 2 V
MDC IDC SET LEADCHNL RV PACING PULSEWIDTH: 0.4 ms

## 2014-09-05 NOTE — Progress Notes (Signed)
CRT-P device check in clinic. Normal device function. Thresholds, sensing, impedance consistent with previous measurements. Histograms appropriate for patient and level of activity. 335 mode switches, atrial tachy and PAF, + coumadin.  Total burden <1%.  19 ventricular high rate episodes, 1:1 conduction.  The patient has only been taking his Carvedilol daily.  I went over all his medications today to insure he has the proper instruction.   Patient bi-ventricularly pacing >89% of the time.  Quick opt done today and AV delays reprogrammed 200/127msec-> 180/173msec.  PVC count 3.1% since July.   Device programmed with appropriate safety margins. Device heart failure diagnostics are within normal limits and stable over time. Estimated longevity 4.9 years. ROV in July with Dr. Graciela Husbands.

## 2014-09-19 ENCOUNTER — Encounter: Payer: Self-pay | Admitting: Internal Medicine

## 2014-09-27 ENCOUNTER — Ambulatory Visit (INDEPENDENT_AMBULATORY_CARE_PROVIDER_SITE_OTHER): Payer: Medicare Other | Admitting: *Deleted

## 2014-09-27 DIAGNOSIS — I499 Cardiac arrhythmia, unspecified: Secondary | ICD-10-CM

## 2014-09-27 DIAGNOSIS — Z7901 Long term (current) use of anticoagulants: Secondary | ICD-10-CM | POA: Diagnosis not present

## 2014-09-27 DIAGNOSIS — I4892 Unspecified atrial flutter: Secondary | ICD-10-CM | POA: Diagnosis not present

## 2014-09-27 LAB — POCT INR: INR: 3.3

## 2014-10-03 ENCOUNTER — Other Ambulatory Visit: Payer: Self-pay

## 2014-10-03 MED ORDER — FUROSEMIDE 20 MG PO TABS: 20.0000 mg | ORAL_TABLET | Freq: Every day | ORAL | Status: DC

## 2014-10-11 ENCOUNTER — Ambulatory Visit (INDEPENDENT_AMBULATORY_CARE_PROVIDER_SITE_OTHER): Payer: Medicare Other | Admitting: *Deleted

## 2014-10-11 DIAGNOSIS — I499 Cardiac arrhythmia, unspecified: Secondary | ICD-10-CM

## 2014-10-11 DIAGNOSIS — I4892 Unspecified atrial flutter: Secondary | ICD-10-CM

## 2014-10-11 DIAGNOSIS — Z7901 Long term (current) use of anticoagulants: Secondary | ICD-10-CM

## 2014-10-11 LAB — POCT INR: INR: 2.7

## 2014-10-24 ENCOUNTER — Other Ambulatory Visit (HOSPITAL_COMMUNITY): Payer: Self-pay | Admitting: *Deleted

## 2014-10-24 MED ORDER — FUROSEMIDE 20 MG PO TABS: 20.0000 mg | ORAL_TABLET | Freq: Every day | ORAL | Status: DC

## 2014-11-13 DIAGNOSIS — N2581 Secondary hyperparathyroidism of renal origin: Secondary | ICD-10-CM | POA: Diagnosis not present

## 2014-11-13 DIAGNOSIS — N189 Chronic kidney disease, unspecified: Secondary | ICD-10-CM | POA: Diagnosis not present

## 2014-11-13 DIAGNOSIS — N183 Chronic kidney disease, stage 3 (moderate): Secondary | ICD-10-CM | POA: Diagnosis not present

## 2014-11-16 DIAGNOSIS — I9589 Other hypotension: Secondary | ICD-10-CM | POA: Diagnosis not present

## 2014-11-16 DIAGNOSIS — N183 Chronic kidney disease, stage 3 (moderate): Secondary | ICD-10-CM | POA: Diagnosis not present

## 2014-11-16 DIAGNOSIS — N2581 Secondary hyperparathyroidism of renal origin: Secondary | ICD-10-CM | POA: Diagnosis not present

## 2014-11-16 DIAGNOSIS — D631 Anemia in chronic kidney disease: Secondary | ICD-10-CM | POA: Diagnosis not present

## 2014-12-21 ENCOUNTER — Ambulatory Visit (INDEPENDENT_AMBULATORY_CARE_PROVIDER_SITE_OTHER): Payer: Medicare Other | Admitting: Internal Medicine

## 2014-12-21 ENCOUNTER — Encounter: Payer: Self-pay | Admitting: Internal Medicine

## 2014-12-21 VITALS — BP 112/72 | HR 92 | Ht 68.0 in | Wt 181.8 lb

## 2014-12-21 DIAGNOSIS — R5383 Other fatigue: Secondary | ICD-10-CM | POA: Diagnosis not present

## 2014-12-21 DIAGNOSIS — I440 Atrioventricular block, first degree: Secondary | ICD-10-CM | POA: Diagnosis not present

## 2014-12-21 DIAGNOSIS — I5022 Chronic systolic (congestive) heart failure: Secondary | ICD-10-CM

## 2014-12-21 LAB — CBC WITH DIFFERENTIAL/PLATELET
BASOS ABS: 0.1 10*3/uL (ref 0.0–0.1)
BASOS PCT: 1 % (ref 0–1)
Eosinophils Absolute: 0.2 10*3/uL (ref 0.0–0.7)
Eosinophils Relative: 4 % (ref 0–5)
HCT: 36.3 % — ABNORMAL LOW (ref 39.0–52.0)
Hemoglobin: 12 g/dL — ABNORMAL LOW (ref 13.0–17.0)
Lymphocytes Relative: 40 % (ref 12–46)
Lymphs Abs: 2.2 10*3/uL (ref 0.7–4.0)
MCH: 28.2 pg (ref 26.0–34.0)
MCHC: 33.1 g/dL (ref 30.0–36.0)
MCV: 85.4 fL (ref 78.0–100.0)
MONO ABS: 0.6 10*3/uL (ref 0.1–1.0)
MPV: 10.6 fL (ref 8.6–12.4)
Monocytes Relative: 12 % (ref 3–12)
NEUTROS ABS: 2.3 10*3/uL (ref 1.7–7.7)
NEUTROS PCT: 43 % (ref 43–77)
Platelets: 318 10*3/uL (ref 150–400)
RBC: 4.25 MIL/uL (ref 4.22–5.81)
RDW: 17.3 % — AB (ref 11.5–15.5)
WBC: 5.4 10*3/uL (ref 4.0–10.5)

## 2014-12-21 LAB — CUP PACEART INCLINIC DEVICE CHECK
Battery Voltage: 2.9 V
Brady Statistic RV Percent Paced: 83 %
Date Time Interrogation Session: 20160617183925
Lead Channel Impedance Value: 375 Ohm
Lead Channel Impedance Value: 687.5 Ohm
Lead Channel Pacing Threshold Amplitude: 0.625 V
Lead Channel Pacing Threshold Pulse Width: 0.4 ms
Lead Channel Pacing Threshold Pulse Width: 1 ms
Lead Channel Sensing Intrinsic Amplitude: 12 mV
Lead Channel Setting Pacing Amplitude: 2 V
Lead Channel Setting Pacing Amplitude: 2 V
Lead Channel Setting Pacing Pulse Width: 0.4 ms
Lead Channel Setting Pacing Pulse Width: 1 ms
MDC IDC MSMT BATTERY REMAINING LONGEVITY: 60 mo
MDC IDC MSMT LEADCHNL RA PACING THRESHOLD AMPLITUDE: 0.75 V
MDC IDC MSMT LEADCHNL RA PACING THRESHOLD PULSEWIDTH: 0.4 ms
MDC IDC MSMT LEADCHNL RA SENSING INTR AMPL: 5 mV
MDC IDC MSMT LEADCHNL RV IMPEDANCE VALUE: 475 Ohm
MDC IDC MSMT LEADCHNL RV PACING THRESHOLD AMPLITUDE: 0.875 V
MDC IDC SET LEADCHNL RA PACING AMPLITUDE: 2 V
MDC IDC SET LEADCHNL RV SENSING SENSITIVITY: 2 mV
MDC IDC STAT BRADY RA PERCENT PACED: 1.3 %
Pulse Gen Model: 3210
Pulse Gen Serial Number: 2665541

## 2014-12-21 LAB — COMPREHENSIVE METABOLIC PANEL WITH GFR
ALT: 27 U/L (ref 0–53)
AST: 60 U/L — ABNORMAL HIGH (ref 0–37)
Albumin: 4.4 g/dL (ref 3.5–5.2)
Alkaline Phosphatase: 40 U/L (ref 39–117)
BUN: 12 mg/dL (ref 6–23)
CO2: 23 meq/L (ref 19–32)
Calcium: 9.1 mg/dL (ref 8.4–10.5)
Chloride: 105 meq/L (ref 96–112)
Creat: 1.37 mg/dL — ABNORMAL HIGH (ref 0.50–1.35)
Glucose, Bld: 84 mg/dL (ref 70–99)
Potassium: 3.6 meq/L (ref 3.5–5.3)
Sodium: 139 meq/L (ref 135–145)
Total Bilirubin: 1.1 mg/dL (ref 0.2–1.2)
Total Protein: 7.4 g/dL (ref 6.0–8.3)

## 2014-12-21 MED ORDER — ASPIRIN EC 81 MG PO TBEC: 81.0000 mg | DELAYED_RELEASE_TABLET | Freq: Every day | ORAL | Status: DC

## 2014-12-21 MED ORDER — FUROSEMIDE 20 MG PO TABS: 20.0000 mg | ORAL_TABLET | Freq: Every day | ORAL | Status: DC

## 2014-12-21 NOTE — Patient Instructions (Addendum)
Medication Instructions:  Your physician has recommended you make the following change in your medication:  1) START Aspirin 81 mg tablet by mouth daily  REMINDER: TAKE YOU POTASSIUM WHEN YOU ARE TAKING YOUR LASIX   Labwork: Your physician recommends that you return for lab work TODay (CBC, TSH, CMET):    Testing/Procedures: Your physician has requested that you have an echocardiogram. Echocardiography is a painless test that uses sound waves to create images of your heart. It provides your doctor with information about the size and shape of your heart and how well your heart's chambers and valves are working. This procedure takes approximately one hour. There are no restrictions for this procedure.    Follow-Up: Your physician recommends that you schedule a follow-up appointment in: 1 the next month with Dr. Gala Romney in the [facility] Failure Clinic.   Your physician wants you to follow-up in: 12 months with Dr. Graciela Husbands. You will receive a reminder letter in the mail two months in advance. If you don't receive a letter, please call our office to schedule the follow-up appointment.   Any Other Special Instructions Will Be Listed Below (If Applicable).

## 2014-12-21 NOTE — Progress Notes (Signed)
Patient Care Team: Radford Pax, NP as PCP - General (Family Medicine)   HPI  [name] is a 69 y.o. male Seen in followup for CRT-P  Implanted September 2012 for profound first degree AV block in the setting of a modest valvular cardiomyopathy ejection fraction 35-40%.    He is s/p MVR for endocarditis associated with IVDA. Most recent echo 6/14 demonstrated ejection fraction 50% and stable mitral valve replacement   There has been a challenge was compliance of anticoagulation; he takes only aspirin  He complains of a lack of energy.  Reviewing hospital laboratories he had a hemoglobin of 7 in early September. It was higher a month later and does not been repeated. Last TSH in the computer is 2009.     Past Medical History  Diagnosis Date  . 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  a- Echo 07/2008 Ef 55% mild peroprosthetic MVR with High transmitral gradient (mean 14).  b- normal coronaries by cath 12/2007  . Atrial fibrillation     post op.  s/p dc-cv 04/2008. previously on amiodarone  . Atrial flutter     atypical  . Heavy alcohol use     history  . IV drug abuse     history of  . CHF (congestive heart failure)     EF 35-40 % 2011 due to valvular disease and diastolic dysfunction  . History of GI bleed   . Hypertension   . GERD (gastroesophageal reflux disease)   . Chronic back pain   . AV block, 1st degree     .450 msec--progressive  . Dysphagia     with normal barium swallow 09/2008  . Pacemaker 2010    Past Surgical History  Procedure Laterality Date  . Pacemaker insertion      inserted about 4-5 years ago  . Cardiac valve replacement      mitral valve, st jude mechanical   . Esophagogastroduodenoscopy N/A 03/25/2014    Procedure: ESOPHAGOGASTRODUODENOSCOPY (EGD);  Surgeon: Iva Boop, MD;  Location: [facility] ENDOSCOPY;  Service: Endoscopy;  Laterality: N/A;  . Colonoscopy N/A 03/26/2014    Procedure:  COLONOSCOPY;  Surgeon: Rachael Fee, MD;  Location: [facility] ENDOSCOPY;  Service: Endoscopy;  Laterality: N/A;  . Givens capsule study N/A 03/26/2014    Procedure: GIVENS CAPSULE STUDY;  Surgeon: Rachael Fee, MD;  Location: [facility] ENDOSCOPY;  Service: Endoscopy;  Laterality: N/A;  . Enteroscopy N/A 03/28/2014    Procedure: ENTEROSCOPY;  Surgeon: Rachael Fee, MD;  Location: [facility] ENDOSCOPY;  Service: Endoscopy;  Laterality: N/A;    Current Outpatient Prescriptions  Medication Sig Dispense Refill  . carvedilol (COREG) 6.25 MG tablet Take 6.25 mg by mouth 2 (two) times daily with a meal.    . furosemide (LASIX) 20 MG tablet Take 1 tablet (20 mg total) by mouth daily. NEEDS OFFICE VISIT FOR FURTHER REFILLS [phone] 15 tablet 0  . KLOR-CON M20 20 MEQ tablet Take 1 tablet by mouth Daily.    Marland Kitchen omeprazole (PRILOSEC) 20 MG capsule Take 20 mg by mouth daily.      . traMADol (ULTRAM) 50 MG tablet Take 1 tablet (50 mg total) by mouth every 8 (eight) hours as needed. 30 tablet 1  . warfarin (COUMADIN) 5 MG tablet Take 1 tablet (5 mg total) by mouth daily. Take 5 mg daily, have PT/INR checked Monday Sept 28th, 2015 30 tablet 0  . zolpidem (  AMBIEN) 5 MG tablet Take 1 tablet (5 mg total) by mouth at bedtime as needed for sleep. 30 tablet 0   No current facility-administered medications for this visit.    No Known Allergies  Review of Systems negative except from HPI and PMH  Physical Exam BP 112/72 mmHg  Pulse 92  Ht  (1.727 m)  Wt 181 lb 12.8 oz (82.464 kg)  BMI 27.65 kg/m2  SpO2 97% Well developed and well nourished in no acute distress HENT normal E scleral and icterus clear Neck Supple JVP flat; carotids brisk and full Clear to ausculation mechanical S1*Regular rate and rhythm, early systolic murmur gallops or rub Soft with active bowel sounds No clubbing cyanosis  Edema Alert and oriented, grossly normal motor and sensory function Skin Warm and Dry    Assessment and   Plan  Nonischemic cardiomyopathy  Congestive heart failure chronic systolic  Fatigue  Atrial tachycardia-nonsustained  Pacemaker-CRT-St. Jude The patient's device was interrogated.  The information was reviewed. No changes were made in the programming.    Euvolemic continue current meds  We will have him take his potassium only when he takes his diuretics.  We will add aspirin to his warfarin as per new guidelines for his mechanical valve especially in light of atrial tachycardias as noted.  He has had problems with significant anemia and I wonder whether she may be related to recurrence. We will check his CBC, his TSH as well as his metabolic profile.

## 2014-12-22 LAB — TSH: TSH: 0.781 u[IU]/mL (ref 0.350–4.500)

## 2014-12-28 ENCOUNTER — Ambulatory Visit (HOSPITAL_COMMUNITY): Payer: Medicare Other | Attending: Internal Medicine

## 2014-12-28 ENCOUNTER — Other Ambulatory Visit: Payer: Self-pay

## 2014-12-28 DIAGNOSIS — I358 Other nonrheumatic aortic valve disorders: Secondary | ICD-10-CM | POA: Diagnosis not present

## 2014-12-28 DIAGNOSIS — I517 Cardiomegaly: Secondary | ICD-10-CM | POA: Diagnosis not present

## 2014-12-28 DIAGNOSIS — I509 Heart failure, unspecified: Secondary | ICD-10-CM | POA: Diagnosis present

## 2014-12-28 DIAGNOSIS — I5022 Chronic systolic (congestive) heart failure: Secondary | ICD-10-CM | POA: Diagnosis not present

## 2015-01-03 ENCOUNTER — Other Ambulatory Visit: Payer: Self-pay | Admitting: Internal Medicine

## 2015-01-10 ENCOUNTER — Other Ambulatory Visit: Payer: Self-pay | Admitting: *Deleted

## 2015-01-10 MED ORDER — FUROSEMIDE 20 MG PO TABS: 20.0000 mg | ORAL_TABLET | Freq: Every day | ORAL | Status: DC

## 2015-01-10 NOTE — Telephone Encounter (Signed)
Refill completed.

## 2015-01-14 ENCOUNTER — Telehealth: Payer: Self-pay | Admitting: Internal Medicine

## 2015-01-14 ENCOUNTER — Emergency Department (HOSPITAL_COMMUNITY)
Admission: EM | Admit: 2015-01-14 | Discharge: 2015-01-14 | Disposition: A | Discharge status: Home / Self Care | Payer: Medicare Other | Attending: Emergency Medicine | Admitting: Emergency Medicine

## 2015-01-14 ENCOUNTER — Emergency Department (HOSPITAL_COMMUNITY): Payer: Medicare Other

## 2015-01-14 ENCOUNTER — Encounter (HOSPITAL_COMMUNITY): Payer: Self-pay | Admitting: Emergency Medicine

## 2015-01-14 DIAGNOSIS — I10 Essential (primary) hypertension: Secondary | ICD-10-CM | POA: Insufficient documentation

## 2015-01-14 DIAGNOSIS — Z7982 Long term (current) use of aspirin: Secondary | ICD-10-CM | POA: Insufficient documentation

## 2015-01-14 DIAGNOSIS — Z7901 Long term (current) use of anticoagulants: Secondary | ICD-10-CM | POA: Diagnosis not present

## 2015-01-14 DIAGNOSIS — R509 Fever, unspecified: Secondary | ICD-10-CM | POA: Diagnosis not present

## 2015-01-14 DIAGNOSIS — R000 Tachycardia, unspecified: Secondary | ICD-10-CM | POA: Insufficient documentation

## 2015-01-14 DIAGNOSIS — J159 Unspecified bacterial pneumonia: Secondary | ICD-10-CM | POA: Insufficient documentation

## 2015-01-14 DIAGNOSIS — G8929 Other chronic pain: Secondary | ICD-10-CM | POA: Diagnosis not present

## 2015-01-14 DIAGNOSIS — Z79899 Other long term (current) drug therapy: Secondary | ICD-10-CM | POA: Insufficient documentation

## 2015-01-14 DIAGNOSIS — Z87891 Personal history of nicotine dependence: Secondary | ICD-10-CM | POA: Insufficient documentation

## 2015-01-14 DIAGNOSIS — Z950 Presence of cardiac pacemaker: Secondary | ICD-10-CM | POA: Diagnosis not present

## 2015-01-14 DIAGNOSIS — K219 Gastro-esophageal reflux disease without esophagitis: Secondary | ICD-10-CM | POA: Insufficient documentation

## 2015-01-14 DIAGNOSIS — R05 Cough: Secondary | ICD-10-CM | POA: Diagnosis not present

## 2015-01-14 DIAGNOSIS — I509 Heart failure, unspecified: Secondary | ICD-10-CM | POA: Insufficient documentation

## 2015-01-14 DIAGNOSIS — J189 Pneumonia, unspecified organism: Secondary | ICD-10-CM

## 2015-01-14 DIAGNOSIS — R079 Chest pain, unspecified: Secondary | ICD-10-CM | POA: Diagnosis not present

## 2015-01-14 LAB — BASIC METABOLIC PANEL
Anion gap: 11 (ref 5–15)
BUN: 10 mg/dL (ref 6–20)
CHLORIDE: 104 mmol/L (ref 101–111)
CO2: 22 mmol/L (ref 22–32)
Calcium: 8.8 mg/dL — ABNORMAL LOW (ref 8.9–10.3)
Creatinine, Ser: 1.55 mg/dL — ABNORMAL HIGH (ref 0.61–1.24)
GFR calc Af Amer: 50 mL/min — ABNORMAL LOW (ref >60)
GFR calc non Af Amer: 43 mL/min — ABNORMAL LOW (ref >60)
Glucose, Bld: 137 mg/dL — ABNORMAL HIGH (ref 65–99)
POTASSIUM: 3.2 mmol/L — AB (ref 3.5–5.1)
Sodium: 137 mmol/L (ref 135–145)

## 2015-01-14 LAB — CBC
HEMATOCRIT: 36.4 % — AB (ref 39.0–52.0)
Hemoglobin: 11.9 g/dL — ABNORMAL LOW (ref 13.0–17.0)
MCH: 28.5 pg (ref 26.0–34.0)
MCHC: 32.7 g/dL (ref 30.0–36.0)
MCV: 87.3 fL (ref 78.0–100.0)
Platelets: 275 10*3/uL (ref 150–400)
RBC: 4.17 MIL/uL — ABNORMAL LOW (ref 4.22–5.81)
RDW: 16.8 % — AB (ref 11.5–15.5)
WBC: 7.9 10*3/uL (ref 4.0–10.5)

## 2015-01-14 LAB — BRAIN NATRIURETIC PEPTIDE: B NATRIURETIC PEPTIDE 5: 412.7 pg/mL — AB (ref 0.0–100.0)

## 2015-01-14 LAB — I-STAT CG4 LACTIC ACID, ED: Lactic Acid, Venous: 0.98 mmol/L (ref 0.5–2.0)

## 2015-01-14 LAB — PROTIME-INR
INR: 3 — ABNORMAL HIGH (ref 0.00–1.49)
Prothrombin Time: 30.6 seconds — ABNORMAL HIGH (ref 11.6–15.2)

## 2015-01-14 LAB — I-STAT TROPONIN, ED: TROPONIN I, POC: 0.04 ng/mL (ref 0.00–0.08)

## 2015-01-14 MED ORDER — BENZONATATE 100 MG PO CAPS: 100.0000 mg | ORAL_CAPSULE | Freq: Three times a day (TID) | ORAL | Status: DC | PRN

## 2015-01-14 MED ORDER — AEROCHAMBER PLUS W/MASK MISC
1.0000 | Freq: Once | Status: AC
  Administered 2015-01-14: 1
  Filled 2015-01-14 (×2): qty 1

## 2015-01-14 MED ORDER — POTASSIUM CHLORIDE CRYS ER 20 MEQ PO TBCR
40.0000 meq | EXTENDED_RELEASE_TABLET | Freq: Once | ORAL | Status: AC
  Administered 2015-01-14: 40 meq via ORAL
  Filled 2015-01-14: qty 2

## 2015-01-14 MED ORDER — LEVOFLOXACIN 250 MG PO TABS: 750.0000 mg | ORAL_TABLET | Freq: Every day | ORAL | Status: DC

## 2015-01-14 MED ORDER — LEVOFLOXACIN IN D5W 750 MG/150ML IV SOLN
750.0000 mg | Freq: Once | INTRAVENOUS | Status: AC
  Administered 2015-01-14: 750 mg via INTRAVENOUS
  Filled 2015-01-14: qty 150

## 2015-01-14 MED ORDER — LEVOFLOXACIN IN D5W 750 MG/150ML IV SOLN: 750.0000 mg | INTRAVENOUS | Status: DC

## 2015-01-14 MED ORDER — SODIUM CHLORIDE 0.9 % IV BOLUS (SEPSIS)
1000.0000 mL | Freq: Once | INTRAVENOUS | Status: AC
  Administered 2015-01-14: 1000 mL via INTRAVENOUS

## 2015-01-14 MED ORDER — ACETAMINOPHEN 325 MG PO TABS
650.0000 mg | ORAL_TABLET | Freq: Once | ORAL | Status: AC | PRN
  Administered 2015-01-14: 650 mg via ORAL
  Filled 2015-01-14: qty 2

## 2015-01-14 MED ORDER — ALBUTEROL SULFATE HFA 108 (90 BASE) MCG/ACT IN AERS
2.0000 | INHALATION_SPRAY | RESPIRATORY_TRACT | Status: DC | PRN
  Administered 2015-01-14: 2 via RESPIRATORY_TRACT
  Filled 2015-01-14: qty 6.7

## 2015-01-14 NOTE — ED Notes (Signed)
Pt. reports intermittent right chest pain with SOB , dry cough , fatigue and fever onset this week , denies nausea or diaphoresis .

## 2015-01-14 NOTE — Telephone Encounter (Signed)
Pt c/o of Chest Pain: STAT if CP now or developed within 24 hours  1. Are you having CP right now?  Yes  2. Are you experiencing any other symptoms (ex. SOB, nausea, vomiting, sweating)? Sob  3. How long have you been experiencing CP? 2 days  4. Is your CP continuous or coming and going? Coming and going  5. Have you taken Nitroglycerin? No ?

## 2015-01-14 NOTE — ED Notes (Signed)
Pt verbalizes understanding of d/c instructions and denies any further needs at this time. 

## 2015-01-14 NOTE — ED Provider Notes (Signed)
CSN: [id number]     Arrival date & time 01/14/15  1846 History   First MD Initiated Contact with Patient 01/14/15 2009     Chief Complaint  Patient presents with  . Chest Pain    (Consider location/radiation/quality/duration/timing/severity/associated sxs/prior Treatment) HPI Comments: 69 year old male with a history of bacterial endocarditis secondary to IVDU, s/p St. Jude mechanical MVR on chronic coumadin, CHF with LVEF of 25-30%, atrial fibrillation/flutter, HTN, GERD, and pacemaker placement presents to the emergency department for further evaluation of chest pain. Patient states that his pain began 3 days ago after carrying 3 - 5 gallon buckets of water. He describes the pain as a soreness in his right arm and the right side of his chest. Pain has been intermittent. He reports some shortness of breath associated with his symptoms with worsening dyspnea on exertion. He states that he gets very short of breath while walking. He also reports a dry cough as well as a mild headache. Patient took 2 acetaminophen tablets this morning which helped relieve some of his pain (note prior to arrival from CHF clinic notates these to be ASA tablets). He denies any known sick contacts or recent travel. No associated syncope, chills, leg swelling, abdominal pain, nausea, vomiting, diarrhea, or extremity weakness. He was unaware that he had a fever until ED presentation today.  PCP - Jeanella Flattery, NP CHF - Dr. Milas Kocher  Patient is a 69 y.o. male presenting with chest pain. The history is provided by the patient. No language interpreter was used.  Chest Pain Associated symptoms: cough, fever and shortness of breath   Associated symptoms: no abdominal pain, no nausea and not vomiting     Past Medical History  Diagnosis Date  . 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  a- Echo 07/2008 Ef 55% mild peroprosthetic MVR with High transmitral gradient (mean 14).  b- normal  coronaries by cath 12/2007  . Atrial fibrillation     post op.  s/p dc-cv 04/2008. previously on amiodarone  . Atrial flutter     atypical  . Heavy alcohol use     history  . IV drug abuse     history of  . CHF (congestive heart failure)     EF 35-40 % 2011 due to valvular disease and diastolic dysfunction  . History of GI bleed   . Hypertension   . GERD (gastroesophageal reflux disease)   . Chronic back pain   . AV block, 1st degree     .450 msec--progressive  . Dysphagia     with normal barium swallow 09/2008  . Pacemaker 2010   Past Surgical History  Procedure Laterality Date  . Pacemaker insertion      inserted about 4-5 years ago  . Cardiac valve replacement      mitral valve, st jude mechanical   . Esophagogastroduodenoscopy N/A 03/25/2014    Procedure: ESOPHAGOGASTRODUODENOSCOPY (EGD);  Surgeon: Iva Boop, MD;  Location: [facility] ENDOSCOPY;  Service: Endoscopy;  Laterality: N/A;  . Colonoscopy N/A 03/26/2014    Procedure: COLONOSCOPY;  Surgeon: Rachael Fee, MD;  Location: [facility] ENDOSCOPY;  Service: Endoscopy;  Laterality: N/A;  . Givens capsule study N/A 03/26/2014    Procedure: GIVENS CAPSULE STUDY;  Surgeon: Rachael Fee, MD;  Location: [facility] ENDOSCOPY;  Service: Endoscopy;  Laterality: N/A;  . Enteroscopy N/A 03/28/2014    Procedure: ENTEROSCOPY;  Surgeon: Rachael Fee, MD;  Location[facility] ENDOSCOPY;  Service: Endoscopy;  Laterality: N/A;   Family History  Problem Relation Age of Onset  . Coronary artery disease Mother    History  Substance Use Topics  . Smoking status: Former Smoker -- 0 years    Types: Cigarettes  . Smokeless tobacco: Never Used  . Alcohol Use: No     Comment: quit drinking 2 years ago    Review of Systems  Constitutional: Positive for fever. Negative for chills.  Respiratory: Positive for cough and shortness of breath.   Cardiovascular: Positive for chest pain. Negative for leg swelling.  Gastrointestinal: Negative for nausea, vomiting and  abdominal pain.  Genitourinary: Negative for dysuria.  Neurological: Negative for syncope.  All other systems reviewed and are negative.   Allergies  Review of patient's allergies indicates no known allergies.  Home Medications   Prior to Admission medications   Medication Sig Start Date End Date Taking? Authorizing Provider  aspirin EC 81 MG tablet Take 1 tablet (81 mg total) by mouth daily. 12/21/14   Duke Salvia, MD  carvedilol (COREG) 6.25 MG tablet Take 6.25 mg by mouth 2 (two) times daily with a meal.    Historical Provider, MD  furosemide (LASIX) 20 MG tablet Take 1 tablet (20 mg total) by mouth daily. 01/10/15   Duke Salvia, MD  KLOR-CON M20 20 MEQ tablet Take 1 tablet by mouth Daily. 12/15/10   Historical Provider, MD  omeprazole (PRILOSEC) 20 MG capsule Take 20 mg by mouth daily.      Historical Provider, MD  traMADol (ULTRAM) 50 MG tablet Take 1 tablet (50 mg total) by mouth every 8 (eight) hours as needed. 03/30/14   Dorothea Ogle, MD  warfarin (COUMADIN) 5 MG tablet Take 1 tablet (5 mg total) by mouth daily. Take 5 mg daily, have PT/INR checked Monday Sept 28th, 2015 03/30/14   Dorothea Ogle, MD  zolpidem (AMBIEN) 5 MG tablet Take 1 tablet (5 mg total) by mouth at bedtime as needed for sleep. 03/30/14   Dorothea Ogle, MD   BP 96/66 mmHg  Pulse 108  Temp(Src) 100.2 F (37.9 C) (Oral)  Resp 30  Ht 5\' 8"  (1.727 m)  Wt 185 lb (83.915 kg)  BMI 28.14 kg/m2  SpO2 97%   Physical Exam  Constitutional: He is oriented to person, place, and time. He appears well-developed and well-nourished. No distress.  Nontoxic/nonseptic appearing  HENT:  Head: Normocephalic and atraumatic.  Eyes: Conjunctivae and EOM are normal. No scleral icterus.  Neck: Normal range of motion.  Cardiovascular: Regular rhythm and intact distal pulses.  Tachycardia present.   Pulmonary/Chest: Effort normal and breath sounds normal. No respiratory distress. He has no wheezes.  Chest expansion symmetric. No  dyspnea noted. No wheezes or rhonchi appreciated.  Abdominal: He exhibits no distension.  Musculoskeletal: Normal range of motion.  Neurological: He is alert and oriented to person, place, and time. He exhibits normal muscle tone. Coordination normal.  GCS 15. Speech is goal oriented. Patient moves extremities without ataxia.  Skin: Skin is warm and dry. No rash noted. He is not diaphoretic. No erythema. No pallor.  Psychiatric: He has a normal mood and affect. His behavior is normal.  Nursing note and vitals reviewed.   ED Course  Procedures (including critical care time) Labs Review Labs Reviewed  BASIC METABOLIC PANEL - Abnormal; Notable for the following:    Potassium 3.2 (*)    Glucose, Bld 137 (*)    Creatinine, Ser 1.55 (*)    Calcium  8.8 (*)    GFR calc non Af Amer 43 (*)    GFR calc Af Amer 50 (*)    All other components within normal limits  CBC - Abnormal; Notable for the following:    RBC 4.17 (*)    Hemoglobin 11.9 (*)    HCT 36.4 (*)    RDW 16.8 (*)    All other components within normal limits  PROTIME-INR - Abnormal; Notable for the following:    Prothrombin Time 30.6 (*)    INR 3.00 (*)    All other components within normal limits  BRAIN NATRIURETIC PEPTIDE - Abnormal; Notable for the following:    B Natriuretic Peptide 412.7 (*)    All other components within normal limits  CULTURE, BLOOD (ROUTINE X 2)  CULTURE, BLOOD (ROUTINE X 2)  I-STAT TROPOININ, ED  I-STAT CG4 LACTIC ACID, ED    Imaging Review Dg Chest 2 View  01/14/2015   CLINICAL DATA:  69 year old male with history shortness breath and cough for the past 3 days. Chest pain since yesterday. Fever to 101 degrees today.  EXAM: CHEST  2 VIEW  COMPARISON:  Chest x-ray 03/23/2014.  FINDINGS: Lung volumes are low. No consolidative airspace disease. No pleural effusions. No pneumothorax. No pulmonary nodule or mass noted. Pulmonary vasculature and the cardiomediastinal silhouette are within normal  limits. Elevation of left hemidiaphragm, similar prior studies. Mechanical mitral valve. Left-sided biventricular pacemaker in position with lead tips projecting over the expected location of the right atrium, right ventricular apex and overlying the lateral wall the left ventricle via the coronary sinus and coronary veins.  IMPRESSION: 1. Low lung volumes without radiographic evidence of acute cardiopulmonary disease. 2. Postoperative changes and support apparatus, as above.   Electronically Signed   By: Trudie Reed M.D.   On: 01/14/2015 20:12     EKG Interpretation   Date/Time:  Monday January 14 2015 18:55:28 EDT Ventricular Rate:  119 PR Interval:    QRS Duration: 92 QT Interval:  340 QTC Calculation: 478 R Axis:   38 Text Interpretation:  Accelerated Junctional rhythm with retrograde  conduction with occasional Premature ventricular complexes T wave  abnormality, consider anterolateral ischemia Abnormal ECG Since last  tracing pacer no longer seen TW abnormalities present not seen on prior  paced tracing Confirmed by MILLER  MD, BRIAN ([id number]) on 01/14/2015 7:01:38  PM      CURB-65 score 2 c/w moderate risk of 30 day mortality; recommend inpt vs close outpt f/u. MDM   Final diagnoses:  CAP (community acquired pneumonia)    69 year old male presents to the emergency department for further evaluation of right-sided chest pain with shortness of breath and dyspnea on exertion. Symptoms also associated with a cough and fever. Fever has improved over ED course with antipyretics. Heart rate and blood pressure have also improved with IV fluids.  Patient has no leukocytosis. His lactate is normal. H/H stable compared with prior work ups. No evidence of significant fluid overload on exam. No obvious focal consolidation on CXR, though clinical picture is concerning for PNA. Patient is able to ambulate in the emergency department with oxygen saturations between 95 and 99% on room air. He has  no chronic oxygen requirement. No recent hospital admissions.  Patient has been given the option of inpatient versus outpatient management. He reports that he desires discharge and close outpatient follow-up. Patient given initial dose of IV Levaquin in the emergency department. Will discharge with same to cover for  pneumonia. Patient also given an albuterol inhaler and prescription for Tessalon for cough. Return precautions discussed and provided. Patient agreeable to plan with no unaddressed concerns. Patient discharged in good condition.   Filed Vitals:   01/14/15 2115 01/14/15 2130 01/14/15 2145 01/14/15 2234  BP: 114/67 95/77 101/83 110/73  Pulse: 101 101 101 99  Temp:      TempSrc:      Resp: Height:      Weight:      SpO2: 96% 96% 98% 98%     Antony Madura, PA-C 01/14/15 2300  Eber Hong, MD 01/15/15 1610

## 2015-01-14 NOTE — Telephone Encounter (Signed)
Reviewed with Dr. Johney Frame and pt should take one extra Lasix and potassium for the next 3 days.  Contact heart failure clinic if no improvement or worsening symptoms. I spoke with pt and girlfriend and gave them this information. Pt is aware of heart failure appt.  Pt will take extra furosemide and potassium now and on Tuesday and Wednesday.  I asked him to follow up with primary care if right chest/arm pain does not improve.

## 2015-01-14 NOTE — Progress Notes (Signed)
ANTIBIOTIC CONSULT NOTE - INITIAL  Pharmacy Consult for Levaquin Indication: rule out pneumonia  No Known Allergies  Patient Measurements: Height: 5\' 8"  (172.7 cm) Weight: 185 lb (83.915 kg) IBW/kg (Calculated) : 68.4   Vital Signs: Temp: 100.2 F (37.9 C) (07/11 2031) Temp Source: Oral (07/11 2031) BP: 96/66 mmHg (07/11 2031) Pulse Rate: 108 (07/11 2031) Intake/Output from previous day:   Intake/Output from this shift:    Labs:  Recent Labs  01/14/15 1910  WBC 7.9  HGB 11.9*  PLT 275  CREATININE 1.55*   Estimated Creatinine Clearance: 46.1 mL/min (by C-G formula based on Cr of 1.55). No results for input(s): VANCOTROUGH, VANCOPEAK, VANCORANDOM, GENTTROUGH, GENTPEAK, GENTRANDOM, TOBRATROUGH, TOBRAPEAK, TOBRARND, AMIKACINPEAK, AMIKACINTROU, AMIKACIN in the last 72 hours.   Microbiology: No results found for this or any previous visit (from the past 720 hour(s)).  Medical History: Past Medical History  Diagnosis Date  . 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  a- Echo 07/2008 Ef 55% mild peroprosthetic MVR with High transmitral gradient (mean 14).  b- normal coronaries by cath 12/2007  . Atrial fibrillation     post op.  s/p dc-cv 04/2008. previously on amiodarone  . Atrial flutter     atypical  . Heavy alcohol use     history  . IV drug abuse     history of  . CHF (congestive heart failure)     EF 35-40 % 2011 due to valvular disease and diastolic dysfunction  . History of GI bleed   . Hypertension   . GERD (gastroesophageal reflux disease)   . Chronic back pain   . AV block, 1st degree     .450 msec--progressive  . Dysphagia     with normal barium swallow 09/2008  . Pacemaker 2010    Assessment: 78 YOM with CHF, St Jude MVR who presented [facility] the [facility] on [facility] with R-sided CP and SOB. Pharmacy consulted to start Levaquin for r/o PNA.   Of noting, the patient had been out of lasix and recently restarted two days ago.  The HF clinic had instructed the patient to take an extra lasix/KCl until he could be seen in the HF clinic. It is likely, the SOB could be related to the fluid overload instead of infectious cause. Consider limiting antibiotic duration if symptoms resolve with fluid management.   Goal of Therapy:  Proper antibiotics for infection/cultures adjusted for renal/hepatic function   Plan:  1. Levaquin 750 mg IV every 48 hours 2. Will continue to follow renal function, culture results, LOT, and antibiotic de-escalation plans   Georgina Pillion, PharmD, BCPS Clinical Pharmacist Pager: [phone] 01/14/2015 8:43 PM

## 2015-01-14 NOTE — Telephone Encounter (Signed)
Received transferred call from scheduling and spoke with pt's girlfriend.  She is currently with pt and he is answering her questions in the background.   Pt reports pain on right side of chest for 2 days.  Pain is off and on. Not having pain at current time. Pain relieved with 2 ASA on one occasion.   Pain lasts about 10 minutes when he has it. Worse when he moves his right arm.  Girlfriend has been trying to get him to go to a doctor but she reports pt does not want to go.  Lifted 3 buckets of 5 gallons of water and pain started after this.  Reports shortness of breath with exertion that started this AM. Does not weigh daily.  Little swelling in feet and ankles but this has not increased.  Was out of Lasix for about a week and resumed 2 days ago.  Has taken this morning's dose. Has not been seen in heart failure clinic for awhile but is scheduled to see them on January 21, 2015. I told pt pain sounds as if it is related to movement and lifting.  Will review with provider in office regarding shortness of breath.

## 2015-01-14 NOTE — Discharge Instructions (Signed)
You may use an albuterol inhaler, 2 puffs every 4 hours, as needed for shortness of breath and cough. Take Tessalon as prescribed for cough. Also take Levaquin to cover for pneumonia. You were given your first dose in the emergency department today. Your next dose is due in the evening of 01/15/2015. Follow-up with your primary care doctor in 2 days for a recheck of symptoms. Also have your INR level rechecked in 4 days. Return to the emergency department if symptoms worsen.  Pneumonia Pneumonia is an infection of the lungs.  CAUSES Pneumonia may be caused by bacteria or a virus. Usually, these infections are caused by breathing infectious particles into the lungs (respiratory tract). SIGNS AND SYMPTOMS   Cough.  Fever.  Chest pain.  Increased rate of breathing.  Wheezing.  Mucus production. DIAGNOSIS  If you have the common symptoms of pneumonia, your health care provider will typically confirm the diagnosis with a chest X-ray. The X-ray will show an abnormality in the lung (pulmonary infiltrate) if you have pneumonia. Other tests of your blood, urine, or sputum may be done to find the specific cause of your pneumonia. Your health care provider may also do tests (blood gases or pulse oximetry) to see how well your lungs are working. TREATMENT  Some forms of pneumonia may be spread to other people when you cough or sneeze. You may be asked to wear a mask before and during your exam. Pneumonia that is caused by bacteria is treated with antibiotic medicine. Pneumonia that is caused by the influenza virus may be treated with an antiviral medicine. Most other viral infections must run their course. These infections will not respond to antibiotics.  HOME CARE INSTRUCTIONS   Cough suppressants may be used if you are losing too much rest. However, coughing protects you by clearing your lungs. You should avoid using cough suppressants if you can.  Your health care provider may have prescribed  medicine if he or she thinks your pneumonia is caused by bacteria or influenza. Finish your medicine even if you start to feel better.  Your health care provider may also prescribe an expectorant. This loosens the mucus to be coughed up.  Take medicines only as directed by your health care provider.  Do not smoke. Smoking is a common cause of bronchitis and can contribute to pneumonia. If you are a smoker and continue to smoke, your cough may last several weeks after your pneumonia has cleared.  A cold steam vaporizer or humidifier in your room or home may help loosen mucus.  Coughing is often worse at night. Sleeping in a semi-upright position in a recliner or using a couple pillows under your head will help with this.  Get rest as you feel it is needed. Your body will usually let you know when you need to rest. PREVENTION A pneumococcal shot (vaccine) is available to prevent a common bacterial cause of pneumonia. This is usually suggested for:  People over 70 years old.  Patients on chemotherapy.  People with chronic lung problems, such as bronchitis or emphysema.  People with immune system problems. If you are over 65 or have a high risk condition, you may receive the pneumococcal vaccine if you have not received it before. In some countries, a routine influenza vaccine is also recommended. This vaccine can help prevent some cases of pneumonia.You may be offered the influenza vaccine as part of your care. If you smoke, it is time to quit. You may receive instructions on how  to stop smoking. Your health care provider can provide medicines and counseling to help you quit. SEEK MEDICAL CARE IF: You have a fever. SEEK IMMEDIATE MEDICAL CARE IF:   Your illness becomes worse. This is especially true if you are elderly or weakened from any other disease.  You cannot control your cough with suppressants and are losing sleep.  You begin coughing up blood.  You develop pain which is  getting worse or is uncontrolled with medicines.  Any of the symptoms which initially brought you in for treatment are getting worse rather than better.  You develop shortness of breath or chest pain. MAKE SURE YOU:   Understand these instructions.  Will watch your condition.  Will get help right away if you are not doing well or get worse. Document Released: 06/22/2005 Document Revised: 11/06/2013 Document Reviewed: 09/11/2010 [facility] Patient Information 2015 [place], [place]. This information is not intended to replace advice given to you by your health care provider. Make sure you discuss any questions you have with your health care provider.

## 2015-01-14 NOTE — ED Provider Notes (Signed)
The patient is a 69 year old male, presents with cough, fever and some weakness, has been going on for at least 24 hours. On exam the patient has occasional coughing, otherwise his lungs are clear without wheezing, speaks in full sentences, heart rate at 90 on my exam, strong pulses, soft abdomen, no peripheral edema. Ambulation on a pulse ox was 95-100% on room air, chest x-ray without acute focal infiltrates, no leukocytosis, fever defervesced after anti-pyretics, antibodies given for likely subtle pneumonia, I had a long discussion with the patient regarding admission versus home therapy, he has requested to be discharged and return should his symptoms worsen. At this time I feel that is a reasonable choice, he is able to express to me the indications for return.  Of note on his EKG there are some T-wave abnormalities or were not seen on prior EKGs however on those prior EKGs he was in a paced rhythm and today's EKG does not show the paced rhythm. Given his fever and cough I doubt this is an ischemic process.  Medical screening examination/treatment/procedure(s) were conducted as a shared visit with non-physician practitioner(s) and myself.  I personally evaluated the patient during the encounter.  Clinical Impression:   Final diagnoses:  CAP (community acquired pneumonia)      EKG Interpretation  Date/Time:  Monday January 14 2015 18:55:28 EDT Ventricular Rate:  119 PR Interval:    QRS Duration: 92 QT Interval:  340 QTC Calculation: 478 R Axis:   38 Text Interpretation:  Accelerated Junctional rhythm with retrograde conduction with occasional Premature ventricular complexes T wave abnormality, consider anterolateral ischemia Abnormal ECG Since last tracing pacer no longer seen TW abnormalities present not seen on prior paced tracing Confirmed by Adriaan Maltese  MD, Alleyah Twombly ([id number]) on 01/14/2015 7:01:38 PM        Eber Hong, MD 01/15/15 1610

## 2015-01-19 LAB — CULTURE, BLOOD (ROUTINE X 2)
CULTURE: NO GROWTH
Culture: NO GROWTH

## 2015-01-21 ENCOUNTER — Ambulatory Visit (HOSPITAL_COMMUNITY)
Admission: RE | Admit: 2015-01-21 | Discharge: 2015-01-21 | Disposition: A | Discharge status: Home / Self Care | Payer: Medicare Other | Source: Ambulatory Visit | Attending: Internal Medicine | Admitting: Internal Medicine

## 2015-01-21 VITALS — BP 112/52 | HR 85 | Wt 181.2 lb

## 2015-01-21 DIAGNOSIS — Z7901 Long term (current) use of anticoagulants: Secondary | ICD-10-CM | POA: Insufficient documentation

## 2015-01-21 DIAGNOSIS — Z79899 Other long term (current) drug therapy: Secondary | ICD-10-CM | POA: Diagnosis not present

## 2015-01-21 DIAGNOSIS — K219 Gastro-esophageal reflux disease without esophagitis: Secondary | ICD-10-CM | POA: Insufficient documentation

## 2015-01-21 DIAGNOSIS — I5022 Chronic systolic (congestive) heart failure: Secondary | ICD-10-CM | POA: Insufficient documentation

## 2015-01-21 DIAGNOSIS — I059 Rheumatic mitral valve disease, unspecified: Secondary | ICD-10-CM

## 2015-01-21 DIAGNOSIS — Z950 Presence of cardiac pacemaker: Secondary | ICD-10-CM | POA: Insufficient documentation

## 2015-01-21 DIAGNOSIS — Z952 Presence of prosthetic heart valve: Secondary | ICD-10-CM | POA: Diagnosis not present

## 2015-01-21 DIAGNOSIS — J189 Pneumonia, unspecified organism: Secondary | ICD-10-CM

## 2015-01-21 DIAGNOSIS — I429 Cardiomyopathy, unspecified: Secondary | ICD-10-CM | POA: Diagnosis not present

## 2015-01-21 DIAGNOSIS — Z7982 Long term (current) use of aspirin: Secondary | ICD-10-CM | POA: Diagnosis not present

## 2015-01-21 DIAGNOSIS — I10 Essential (primary) hypertension: Secondary | ICD-10-CM | POA: Diagnosis not present

## 2015-01-21 MED ORDER — LISINOPRIL 2.5 MG PO TABS: 2.5000 mg | ORAL_TABLET | Freq: Every day | ORAL | Status: DC

## 2015-01-21 NOTE — Progress Notes (Signed)
Patient ID: [name], male   DOB: [birth year], 69 y.o.   MRN: [id number]  Primary Cardiologist: Dr. Gala Romney PCP: N/A EP: Dr Graciela Husbands   HPI: [name] is a 69 year old male with h/o polysubstance abuse c/b MV endocarditis s/p St. Jude MVR, CHF due to NICM and PAF.  Underwent CRT-P implant for bradycardia and fatigue.   Follow up for Heart failure: Last week he was evaluated in [facility] ED and was treated for CAP. Started on course of levaquin and given albuterol. Feeling a little better. Mild dyspnea with exertion. Denies PND/Orthopnea. Appetite ok. No fevers. Taking medications as prescribed. Has not needed any lasix. Following a low salt diet and drinking less than 2L a day. Denies BRBPR.   Labs (1/15): K 3.9, creatinine 1.56, hemoglobin 11 Labs (01/14/2015) K 3.2 Creatinine 1.55 BNP 412  TTE in 2010 showed normal EF with elevated gradient across MV (mean 14) with perivalvular leak. Follow-up TEE in June 2010 with EF 40-45% mild stenosis mean gradient = 8. No vegetation or abscess.   Echo 8/12 EF 35% mild perivalvular MR.  Echo 3/13 EF 50% mild MVR stable with mild peri-valvular leak.  ECHO 12/16/2012 EF 50% stable MVR ECHO 12/2014 - EF 25-30%  Grade I DD   SH: Lives with his daughter's mom. No ETOH, tobacco abuse or drugs.  FH: Mom deceased: no cardiac issues        Father deceased: no cardiac issues.   ROS: All systems negative except as listed in HPI, PMH and Problem List.  Past Medical History  Diagnosis Date  . 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  a- Echo 07/2008 Ef 55% mild peroprosthetic MVR with High transmitral gradient (mean 14).  b- normal coronaries by cath 12/2007  . Atrial fibrillation     post op.  s/p dc-cv 04/2008. previously on amiodarone  . Atrial flutter     atypical  . Heavy alcohol use     history  . IV drug abuse     history of  . CHF (congestive heart failure)     EF 35-40 % 2011 due to valvular disease and  diastolic dysfunction  . History of GI bleed   . Hypertension   . GERD (gastroesophageal reflux disease)   . Chronic back pain   . AV block, 1st degree     .450 msec--progressive  . Dysphagia     with normal barium swallow 09/2008  . Pacemaker 2010    Current Outpatient Prescriptions  Medication Sig Dispense Refill  . acetaminophen (TYLENOL) 500 MG tablet Take 1,000 mg by mouth daily as needed for headache.    Marland Kitchen aspirin EC 81 MG tablet Take 1 tablet (81 mg total) by mouth daily. 90 tablet 3  . benzonatate (TESSALON) 100 MG capsule Take 1 capsule (100 mg total) by mouth 3 (three) times daily as needed for cough. 21 capsule 0  . carvedilol (COREG) 6.25 MG tablet Take 6.25 mg by mouth 2 (two) times daily with a meal.    . furosemide (LASIX) 20 MG tablet Take 1 tablet (20 mg total) by mouth daily. 90 tablet 3  . Homeopathic Products (EARACHE DROPS OT) Place 3-4 drops in ear(s) every other day as needed (Left ear pain).    Marland Kitchen KLOR-CON M20 20 MEQ tablet Take 1 tablet by mouth Daily.    Marland Kitchen levofloxacin (LEVAQUIN) 250 MG tablet Take 3 tablets (750 mg total) by mouth daily. Start on  01/15/15 12 tablet 0  . omeprazole (PRILOSEC) 20 MG capsule Take 20 mg by mouth daily.      . traMADol (ULTRAM) 50 MG tablet Take 1 tablet (50 mg total) by mouth every 8 (eight) hours as needed. 30 tablet 1  . warfarin (COUMADIN) 5 MG tablet Take 1 tablet (5 mg total) by mouth daily. Take 5 mg daily, have PT/INR checked Monday Sept 28th, 2015 (Patient taking differently: Take 5 mg by mouth See admin instructions. Take 1 tablet (5 mg) daily EXCEPT for 1.5 tablets (7.5 mg) on T/Th/Sun) 30 tablet 0   No current facility-administered medications for this encounter.    Filed Vitals:   01/21/15 1434  BP: 112/52  Pulse: 85    PHYSICAL EXAM: Gen: well appearing. no resp difficulty HEENT: normal x for edentulous Neck: supple. no JVD. Carotids 2+ bilat; no bruits. No  Cor: PMI nondisplaced. Regular. No rubs, gallops.  mechanical s1. No murmur. Lungs: Clear   Abdomen: soft, nontender, nondistended.  hepatosplenomegaly. No bruits or masses. Good bowel sounds. Extremities: no cyanosis, clubbing, rash, no edema. .  Neuro: alert & orientedx3, cranial nerves grossly intact. moves all 4 extremities w/o difficulty. affect pleasant  ASSESSMENT & PLAN: 1. Chronic systolic HF: Nonischemic cardiomyopathy s/p St Jude  CRT-D.  EF down from 45-50% in June 2014 to 25-30%  12/2014  NYHA class II-III symptoms.   Continue prn Lasix.  - Continue Coreg but take it 6.25 mg bid. May need digoxin.  - Not on lisinopril and I am not sure why today I will start 2.5 mg lisinopril daily.  BP not that high. Can increase as needed.  -Check BMET in 7 days.   2. Mechanical mitral valve:  S/P MVR On coumadin. + Aspirin per Dr Graciela Husbands.   INR today. [facility]   3. GI Bleed- 03/2014- No BRBPR. Continue PPI.  4. Community Acquired Pneumonia- Completed antibiotic course.   Follow up in 7 days for BMET and 2 weeks for visit.    CLEGG,AMY NP-C  01/21/2015

## 2015-01-21 NOTE — Patient Instructions (Signed)
Start Lisinopril 2.5 mg daily  Labs in about 7-10 days  Your physician recommends that you schedule a follow-up appointment in: 2 weeks with Tonye Becket, NP

## 2015-01-22 DIAGNOSIS — J189 Pneumonia, unspecified organism: Secondary | ICD-10-CM | POA: Insufficient documentation

## 2015-01-30 ENCOUNTER — Ambulatory Visit (HOSPITAL_COMMUNITY)
Admission: RE | Admit: 2015-01-30 | Discharge: 2015-01-30 | Disposition: A | Discharge status: Home / Self Care | Payer: Medicare Other | Source: Ambulatory Visit | Attending: Internal Medicine | Admitting: Internal Medicine

## 2015-01-30 DIAGNOSIS — I5022 Chronic systolic (congestive) heart failure: Secondary | ICD-10-CM

## 2015-01-30 LAB — BASIC METABOLIC PANEL
Anion gap: 8 (ref 5–15)
BUN: 15 mg/dL (ref 6–20)
CALCIUM: 9.3 mg/dL (ref 8.9–10.3)
CHLORIDE: 103 mmol/L (ref 101–111)
CO2: 24 mmol/L (ref 22–32)
Creatinine, Ser: 1.47 mg/dL — ABNORMAL HIGH (ref 0.61–1.24)
GFR, EST AFRICAN AMERICAN: 54 mL/min — AB (ref >60)
GFR, EST NON AFRICAN AMERICAN: 46 mL/min — AB (ref >60)
Glucose, Bld: 99 mg/dL (ref 65–99)
POTASSIUM: 3.8 mmol/L (ref 3.5–5.1)
Sodium: 135 mmol/L (ref 135–145)

## 2015-02-01 ENCOUNTER — Other Ambulatory Visit (HOSPITAL_COMMUNITY): Payer: Self-pay | Admitting: Anesthesiology

## 2015-02-01 DIAGNOSIS — I5022 Chronic systolic (congestive) heart failure: Secondary | ICD-10-CM

## 2015-02-05 ENCOUNTER — Ambulatory Visit (HOSPITAL_COMMUNITY)
Admission: RE | Admit: 2015-02-05 | Discharge: 2015-02-05 | Disposition: A | Discharge status: Home / Self Care | Payer: Medicare Other | Source: Ambulatory Visit | Attending: Cardiology | Admitting: Cardiology

## 2015-02-05 ENCOUNTER — Encounter (HOSPITAL_COMMUNITY): Payer: Self-pay

## 2015-02-05 VITALS — BP 117/77 | HR 77 | Resp 18 | Wt 175.5 lb

## 2015-02-05 DIAGNOSIS — K219 Gastro-esophageal reflux disease without esophagitis: Secondary | ICD-10-CM | POA: Diagnosis not present

## 2015-02-05 DIAGNOSIS — Z950 Presence of cardiac pacemaker: Secondary | ICD-10-CM | POA: Insufficient documentation

## 2015-02-05 DIAGNOSIS — Z7982 Long term (current) use of aspirin: Secondary | ICD-10-CM | POA: Diagnosis not present

## 2015-02-05 DIAGNOSIS — I5022 Chronic systolic (congestive) heart failure: Secondary | ICD-10-CM | POA: Diagnosis not present

## 2015-02-05 DIAGNOSIS — Z952 Presence of prosthetic heart valve: Secondary | ICD-10-CM | POA: Diagnosis not present

## 2015-02-05 DIAGNOSIS — I059 Rheumatic mitral valve disease, unspecified: Secondary | ICD-10-CM

## 2015-02-05 DIAGNOSIS — G8929 Other chronic pain: Secondary | ICD-10-CM | POA: Diagnosis not present

## 2015-02-05 DIAGNOSIS — Z79899 Other long term (current) drug therapy: Secondary | ICD-10-CM | POA: Insufficient documentation

## 2015-02-05 DIAGNOSIS — Z7901 Long term (current) use of anticoagulants: Secondary | ICD-10-CM | POA: Diagnosis not present

## 2015-02-05 DIAGNOSIS — I429 Cardiomyopathy, unspecified: Secondary | ICD-10-CM | POA: Insufficient documentation

## 2015-02-05 DIAGNOSIS — K922 Gastrointestinal hemorrhage, unspecified: Secondary | ICD-10-CM | POA: Diagnosis not present

## 2015-02-05 DIAGNOSIS — M549 Dorsalgia, unspecified: Secondary | ICD-10-CM | POA: Insufficient documentation

## 2015-02-05 DIAGNOSIS — I10 Essential (primary) hypertension: Secondary | ICD-10-CM | POA: Insufficient documentation

## 2015-02-05 MED ORDER — DIGOXIN 125 MCG PO TABS: 0.1250 mg | ORAL_TABLET | Freq: Every day | ORAL | Status: DC

## 2015-02-05 MED ORDER — FUROSEMIDE 20 MG PO TABS: 20.0000 mg | ORAL_TABLET | Freq: Every day | ORAL | Status: DC | PRN

## 2015-02-05 MED ORDER — LISINOPRIL 2.5 MG PO TABS: 2.5000 mg | ORAL_TABLET | Freq: Two times a day (BID) | ORAL | Status: DC

## 2015-02-05 MED ORDER — POTASSIUM CHLORIDE CRYS ER 20 MEQ PO TBCR: 20.0000 meq | EXTENDED_RELEASE_TABLET | Freq: Every day | ORAL | Status: DC | PRN

## 2015-02-05 NOTE — Patient Instructions (Signed)
CHANGE Lasix and Potassium to 1 tablet each AS NEEDED for weight greater than 181 pounds.  INCREASE Lisinopril to 2.5 mg TWICE daily.  START Digoxin 0.125 mg tablet once daily.  Return in 1-2 weeks for lab work.  Follow up 1 month.  Do the following things EVERYDAY: 1) Weigh yourself in the morning before breakfast. Write it down and keep it in a log. 2) Take your medicines as prescribed 3) Eat low salt foods-Limit salt (sodium) to 2000 mg per day.  4) Stay as active as you can everyday 5) Limit all fluids for the day to less than 2 liters

## 2015-02-05 NOTE — Progress Notes (Signed)
Patient ID: [name], male   DOB: 1945-08-05, 69 y.o.   MRN: [id number] Primary Cardiologist: Dr. Gala Romney PCP: N/A EP: Dr Graciela Husbands   HPI: [name] is a 69 year old male with h/o polysubstance abuse c/b MV endocarditis s/p St. Jude MVR, CHF due to NICM and PAF.  Underwent CRT-P implant for bradycardia and fatigue.   Follow up for Heart failure: Last visit he was started on lisinopril . Complaining of fatigue. Denies SOB/PND/Orthopnea. Able to walk around the block. Mild dyspnea with steps. No BRBPR. Taking all medications. Not weighing at home. Does not have a scale   Labs (1/15): K 3.9, creatinine 1.56, hemoglobin 11 Labs (01/14/2015) K 3.2 Creatinine 1.55 BNP 412 Labs 01/30/15: k 3.8 Creatinine 1.47   TTE in 2010 showed normal EF with elevated gradient across MV (mean 14) with perivalvular leak. Follow-up TEE in June 2010 with EF 40-45% mild stenosis mean gradient = 8. No vegetation or abscess.   Echo 8/12 EF 35% mild perivalvular MR.  Echo 3/13 EF 50% mild MVR stable with mild peri-valvular leak.  ECHO 12/16/2012 EF 50% stable MVR ECHO 12/2014 - EF 25-30%  Grade I DD   SH: Lives with his daughter's mom. No ETOH, tobacco abuse or drugs.  FH: Mom deceased: no cardiac issues        Father deceased: no cardiac issues.   ROS: All systems negative except as listed in HPI, PMH and Problem List.  Past Medical History  Diagnosis Date  . 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  a- Echo 07/2008 Ef 55% mild peroprosthetic MVR with High transmitral gradient (mean 14).  b- normal coronaries by cath 12/2007  . Atrial fibrillation     post op.  s/p dc-cv 04/2008. previously on amiodarone  . Atrial flutter     atypical  . Heavy alcohol use     history  . IV drug abuse     history of  . CHF (congestive heart failure)     EF 35-40 % 2011 due to valvular disease and diastolic dysfunction  . History of GI bleed   . Hypertension   . GERD  (gastroesophageal reflux disease)   . Chronic back pain   . AV block, 1st degree     .450 msec--progressive  . Dysphagia     with normal barium swallow 09/2008  . Pacemaker 2010    Current Outpatient Prescriptions  Medication Sig Dispense Refill  . acetaminophen (TYLENOL) 500 MG tablet Take 1,000 mg by mouth daily as needed for headache.    Marland Kitchen aspirin EC 81 MG tablet Take 1 tablet (81 mg total) by mouth daily. 90 tablet 3  . carvedilol (COREG) 6.25 MG tablet TAKE 1 TABLET (6.25 MG TOTAL) BY MOUTH 2 (TWO) TIMES DAILY WITH A MEAL. 60 tablet 6  . furosemide (LASIX) 20 MG tablet Take 1 tablet (20 mg total) by mouth daily. 90 tablet 3  . Homeopathic Products (EARACHE DROPS OT) Place 3-4 drops in ear(s) every other day as needed (Left ear pain).    Marland Kitchen KLOR-CON M20 20 MEQ tablet Take 1 tablet by mouth Daily.    Marland Kitchen lisinopril (PRINIVIL,ZESTRIL) 2.5 MG tablet Take 1 tablet (2.5 mg total) by mouth daily. 30 tablet 3  . omeprazole (PRILOSEC) 20 MG capsule Take 20 mg by mouth daily.      . traMADol (ULTRAM) 50 MG tablet Take 1 tablet (50 mg total) by mouth every 8 (eight) hours  as needed. 30 tablet 1  . warfarin (COUMADIN) 5 MG tablet Take 1 tablet (5 mg total) by mouth daily. Take 5 mg daily, have PT/INR checked Monday Sept 28th, 2015 (Patient taking differently: Take 5 mg by mouth See admin instructions. Take 1 tablet (5 mg) daily EXCEPT for 1.5 tablets (7.5 mg) on T/Th/Sun) 30 tablet 0   No current facility-administered medications for this encounter.    Filed Vitals:   02/05/15 0958  BP: 117/77  Pulse: 77  Resp: 18    PHYSICAL EXAM: Gen: well appearing. no resp difficulty. Sitting in chair.  HEENT: normal x for edentulous Neck: supple. no JVD. Carotids 2+ bilat; no bruits. No  Cor: PMI nondisplaced. Regular. No rubs, gallops. mechanical s1. No murmur. Lungs: Clear   Abdomen: soft, nontender, nondistended.  hepatosplenomegaly. No bruits or masses. Good bowel sounds. Extremities: no  cyanosis, clubbing, rash, no edema. .  Neuro: alert & orientedx3, cranial nerves grossly intact. moves all 4 extremities w/o difficulty. affect pleasant  ASSESSMENT & PLAN: 1. Chronic systolic HF: Nonischemic cardiomyopathy s/p St Jude  CRT-D.  EF down from 45-50% in June 2014 to 25-30%  12/2014  NYHA class II-III symptoms.  Volume status low. Change lasix and potassium to as needed for weight 181 or greater.  - Continue Coreg 6.25 mg bid. Will not increase with fatigue.  - Add digoxin 0.125 mg daily  - Increase lisinopril 2.5 twice a day. Check BMET in 10 days.  -Plan to check ECHO the end of September 2. Mechanical mitral valve:  S/P MVR On coumadin. + Aspirin per Dr Graciela Husbands.   INR today. [facility].   3. GI Bleed- 03/2014- No BRBPR. Continue PPI.  4. Community Acquired Pneumonia- Completed antibiotic course.   Follow up in 10 days for BMET and dig level and 4 weeks for visit. Next visit anticipate adding spiro.    CLEGG,AMY NP-C  02/05/2015

## 2015-02-07 DIAGNOSIS — R413 Other amnesia: Secondary | ICD-10-CM | POA: Diagnosis not present

## 2015-02-07 DIAGNOSIS — I5042 Chronic combined systolic (congestive) and diastolic (congestive) heart failure: Secondary | ICD-10-CM | POA: Diagnosis not present

## 2015-02-07 DIAGNOSIS — Z954 Presence of other heart-valve replacement: Secondary | ICD-10-CM | POA: Diagnosis not present

## 2015-02-13 DIAGNOSIS — N183 Chronic kidney disease, stage 3 (moderate): Secondary | ICD-10-CM | POA: Diagnosis not present

## 2015-02-13 DIAGNOSIS — N2581 Secondary hyperparathyroidism of renal origin: Secondary | ICD-10-CM | POA: Diagnosis not present

## 2015-02-13 DIAGNOSIS — N189 Chronic kidney disease, unspecified: Secondary | ICD-10-CM | POA: Diagnosis not present

## 2015-02-20 ENCOUNTER — Ambulatory Visit (HOSPITAL_COMMUNITY)
Admission: RE | Admit: 2015-02-20 | Discharge: 2015-02-20 | Disposition: A | Discharge status: Home / Self Care | Payer: Medicare Other | Source: Ambulatory Visit | Attending: Internal Medicine | Admitting: Internal Medicine

## 2015-02-20 DIAGNOSIS — I5022 Chronic systolic (congestive) heart failure: Secondary | ICD-10-CM | POA: Insufficient documentation

## 2015-02-20 LAB — BASIC METABOLIC PANEL
ANION GAP: 9 (ref 5–15)
BUN: 19 mg/dL (ref 6–20)
CHLORIDE: 102 mmol/L (ref 101–111)
CO2: 25 mmol/L (ref 22–32)
Calcium: 9.6 mg/dL (ref 8.9–10.3)
Creatinine, Ser: 1.55 mg/dL — ABNORMAL HIGH (ref 0.61–1.24)
GFR calc Af Amer: 50 mL/min — ABNORMAL LOW (ref >60)
GFR calc non Af Amer: 43 mL/min — ABNORMAL LOW (ref >60)
GLUCOSE: 108 mg/dL — AB (ref 65–99)
POTASSIUM: 4 mmol/L (ref 3.5–5.1)
Sodium: 136 mmol/L (ref 135–145)

## 2015-02-20 LAB — DIGOXIN LEVEL: Digoxin Level: 0.6 ng/mL — ABNORMAL LOW (ref 0.8–2.0)

## 2015-03-08 ENCOUNTER — Ambulatory Visit (HOSPITAL_COMMUNITY)
Admission: RE | Admit: 2015-03-08 | Discharge: 2015-03-08 | Disposition: A | Discharge status: Home / Self Care | Payer: Medicare Other | Source: Ambulatory Visit | Attending: Internal Medicine | Admitting: Internal Medicine

## 2015-03-08 ENCOUNTER — Encounter (HOSPITAL_COMMUNITY): Payer: Self-pay

## 2015-03-08 DIAGNOSIS — Z79899 Other long term (current) drug therapy: Secondary | ICD-10-CM | POA: Diagnosis not present

## 2015-03-08 DIAGNOSIS — I5022 Chronic systolic (congestive) heart failure: Secondary | ICD-10-CM | POA: Diagnosis not present

## 2015-03-08 NOTE — Progress Notes (Signed)
Advanced Heart Failure Medication Review by a Pharmacist  Does the patient  feel that his/her medications are working for him/her?  yes  Has the patient been experiencing any side effects to the medications prescribed?  no  Does the patient measure his/her own blood pressure or blood glucose at home?  no   Does the patient have any problems obtaining medications due to transportation or finances?   no  Understanding of regimen: poor Understanding of indications: poor Potential of compliance: good    Pharmacist comments:  [name] is a pleasant 69 yo M presenting with an outdated medication list from April. He has a very poor understanding of his regimen and how he is to take his medications. He states that he does not think he has been taking lisinopril or digoxin which were both started at his last clinic appointment since they were not on the medication list he brought with him today. I called [facility] to verify last fill dates and they were both filled and sold to him on 8/2 (lisinopril #30 day supply and digoxin #90 day supply). He states excellent compliance but we will encourage him to bring his medication bottles with him to his next appointment.   Tyler Deis. Bonnye Fava, PharmD, BCPS, CPP Clinical Pharmacist Pager: [phone] Phone: [phone] 03/08/2015 9:58 AM

## 2015-03-08 NOTE — Patient Instructions (Signed)
STOP Potassium  PLEASE BRING ALL MEDICATION BOTTLES TO YOUR NEXT APPOINTMENT  Your physician recommends that you schedule a follow-up appointment in: 1-2 WEEKS  Do the following things EVERYDAY: 1) Weigh yourself in the morning before breakfast. Write it down and keep it in a log. 2) Take your medicines as prescribed 3) Eat low salt foods-Limit salt (sodium) to 2000 mg per day.  4) Stay as active as you can everyday 5) Limit all fluids for the day to less than 2 liters 6)

## 2015-03-22 ENCOUNTER — Encounter (HOSPITAL_COMMUNITY): Payer: Self-pay

## 2015-03-22 ENCOUNTER — Ambulatory Visit (HOSPITAL_COMMUNITY)
Admission: RE | Admit: 2015-03-22 | Discharge: 2015-03-22 | Disposition: A | Discharge status: Home / Self Care | Payer: Medicare Other | Source: Ambulatory Visit | Attending: Cardiology | Admitting: Cardiology

## 2015-03-22 VITALS — BP 124/66 | HR 77 | Wt 177.8 lb

## 2015-03-22 DIAGNOSIS — G8929 Other chronic pain: Secondary | ICD-10-CM | POA: Insufficient documentation

## 2015-03-22 DIAGNOSIS — I5022 Chronic systolic (congestive) heart failure: Secondary | ICD-10-CM | POA: Diagnosis not present

## 2015-03-22 DIAGNOSIS — Z952 Presence of prosthetic heart valve: Secondary | ICD-10-CM | POA: Diagnosis not present

## 2015-03-22 DIAGNOSIS — I428 Other cardiomyopathies: Secondary | ICD-10-CM | POA: Insufficient documentation

## 2015-03-22 DIAGNOSIS — I10 Essential (primary) hypertension: Secondary | ICD-10-CM | POA: Insufficient documentation

## 2015-03-22 DIAGNOSIS — Z7901 Long term (current) use of anticoagulants: Secondary | ICD-10-CM | POA: Insufficient documentation

## 2015-03-22 DIAGNOSIS — Q2733 Arteriovenous malformation of digestive system vessel: Secondary | ICD-10-CM | POA: Diagnosis not present

## 2015-03-22 DIAGNOSIS — M549 Dorsalgia, unspecified: Secondary | ICD-10-CM | POA: Insufficient documentation

## 2015-03-22 DIAGNOSIS — Z7982 Long term (current) use of aspirin: Secondary | ICD-10-CM | POA: Insufficient documentation

## 2015-03-22 DIAGNOSIS — Z95810 Presence of automatic (implantable) cardiac defibrillator: Secondary | ICD-10-CM | POA: Insufficient documentation

## 2015-03-22 DIAGNOSIS — K5520 Angiodysplasia of colon without hemorrhage: Secondary | ICD-10-CM

## 2015-03-22 DIAGNOSIS — K219 Gastro-esophageal reflux disease without esophagitis: Secondary | ICD-10-CM | POA: Insufficient documentation

## 2015-03-22 DIAGNOSIS — Z79899 Other long term (current) drug therapy: Secondary | ICD-10-CM | POA: Insufficient documentation

## 2015-03-22 MED ORDER — ASPIRIN EC 81 MG PO TBEC: 81.0000 mg | DELAYED_RELEASE_TABLET | Freq: Every day | ORAL | Status: DC

## 2015-03-22 MED ORDER — LISINOPRIL 2.5 MG PO TABS: 2.5000 mg | ORAL_TABLET | Freq: Two times a day (BID) | ORAL | Status: DC

## 2015-03-22 NOTE — Patient Instructions (Signed)
Stop Potassium  Start Aspirin 81 mg daily  Start Lisinopril 2.5 mg Twice daily   Labs in about 10 days  We will contact you in 2 months to schedule your next appointment.

## 2015-03-22 NOTE — Progress Notes (Signed)
Advanced Heart Failure Medication Review by a Pharmacist  Does the patient  feel that his/her medications are working for him/her?  yes  Has the patient been experiencing any side effects to the medications prescribed?  no  Does the patient measure his/her own blood pressure or blood glucose at home?  no   Does the patient have any problems obtaining medications due to transportation or finances?   no  Understanding of regimen: poor Understanding of indications: poor Potential of compliance: good    Pharmacist comments:  [name] is a pleasant 69 yo M presenting with his wife and grandson and his medication bottles. He is still confused about his regimen but states that the bottles he brought with him are all of the medications he is currently taking. He states that he is no longer taking lisinopril because he thinks someone discontinued it at her last appointment. He has also continued taking his potassium although he was supposed to discontinue this at his last appointment. He did not have any other medication-related questions or concerns at this time.   Tyler Deis. Bonnye Fava, PharmD, BCPS, CPP Clinical Pharmacist Pager: [phone] Phone: [phone] 03/22/2015 9:56 AM

## 2015-03-23 NOTE — Progress Notes (Signed)
Patient ID: [name], male   DOB: 03-May-1946, 68 y.o.   MRN: [id number] Primary Cardiologist: Dr. Gala Romney PCP: N/A EP: Dr Graciela Husbands   HPI: [name] is a 69 year old male with h/o polysubstance abuse c/b MV endocarditis s/p St. Jude MVR, CHF due to NICM and PAF.  Underwent CRT-P implant for bradycardia and fatigue.   Follow up for Heart failure:  Last visit, lisinopril was increased to 2.5 mg bid but he is not currently taking lisinopril, not sure why not.  He can walk around the block without dyspnea.  He is short of breath with mowing the grass but he can do it without much problem.  No chest pain.  No orthopnea/PND.  No BRBPR or melena.    ECG: NSR with v-pacing  Labs (1/15): K 3.9, creatinine 1.56, hemoglobin 11 Labs (01/14/2015) K 3.2 Creatinine 1.55 BNP 412 Labs (01/30/15): k 3.8 Creatinine 1.47  Labs (8/16): K 4, creatinine 1.55, digoxin 0.6  TTE in 2010 showed normal EF with elevated gradient across MV (mean 14) with perivalvular leak. Follow-up TEE in June 2010 with EF 40-45% mild stenosis mean gradient = 8. No vegetation or abscess.   Echo 8/12 EF 35% mild perivalvular MR.  Echo 3/13 EF 50% mild MVR stable with mild peri-valvular leak.  ECHO 12/16/2012 EF 50% stable MVR ECHO 12/2014 - EF 25-30%  Grade I DD, mechanical mitral valve with mean gradient 6 mmHg, no significant MR.   SH: Lives with his daughter's mom. No ETOH, tobacco abuse or drugs.  FH: Mom deceased: no cardiac issues        Father deceased: no cardiac issues.   ROS: All systems negative except as listed in HPI, PMH and Problem List.  Past Medical History  Diagnosis Date  . 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  a- Echo 07/2008 Ef 55% mild peroprosthetic MVR with High transmitral gradient (mean 14).  b- normal coronaries by cath 12/2007  . Atrial fibrillation     post op.  s/p dc-cv 04/2008. previously on amiodarone  . Atrial flutter     atypical  . Heavy alcohol use      history  . IV drug abuse     history of  . CHF (congestive heart failure)     EF 35-40 % 2011 due to valvular disease and diastolic dysfunction  . History of GI bleed   . Hypertension   . GERD (gastroesophageal reflux disease)   . Chronic back pain   . AV block, 1st degree     .450 msec--progressive  . Dysphagia     with normal barium swallow 09/2008  . Pacemaker 2010    Current Outpatient Prescriptions  Medication Sig Dispense Refill  . aspirin-acetaminophen-caffeine (EXCEDRIN MIGRAINE) 250-250-65 MG per tablet Take 1 tablet by mouth every 6 (six) hours as needed for headache.    . carvedilol (COREG) 6.25 MG tablet TAKE 1 TABLET (6.25 MG TOTAL) BY MOUTH 2 (TWO) TIMES DAILY WITH A MEAL. 60 tablet 6  . digoxin (LANOXIN) 0.125 MG tablet Take 1 tablet (0.125 mg total) by mouth daily. 90 tablet 3  . furosemide (LASIX) 20 MG tablet Take 1 tablet (20 mg total) by mouth daily as needed. For weight greater than 181 pounds. 30 tablet 6  . omeprazole (PRILOSEC) 20 MG capsule Take 20 mg by mouth daily.      . traMADol (ULTRAM) 50 MG tablet Take 50 mg by mouth every 6 (  six) hours as needed for moderate pain.    Marland Kitchen warfarin (COUMADIN) 5 MG tablet Take 5-7.5 mg by mouth daily. Take 5 mg (1 tablet) daily except 7.5 mg (1 and 1/2 tablets) every Tuesday, Thursday and Sunday    . aspirin EC 81 MG tablet Take 1 tablet (81 mg total) by mouth daily. 90 tablet 3  . lisinopril (PRINIVIL,ZESTRIL) 2.5 MG tablet Take 1 tablet (2.5 mg total) by mouth 2 (two) times daily. 60 tablet 3   No current facility-administered medications for this encounter.    Filed Vitals:   03/22/15 0927  BP: 124/66  Pulse: 77    PHYSICAL EXAM: Gen: well appearing. no resp difficulty. Sitting in chair.  HEENT: normal x for edentulous Neck: supple. no JVD. Carotids 2+ bilat; no bruits. No  Cor: PMI nondisplaced. Regular. No rubs, gallops. mechanical s1. No murmur. Lungs: Clear   Abdomen: soft, nontender, nondistended.   hepatosplenomegaly. No bruits or masses. Good bowel sounds. Extremities: no cyanosis, clubbing, rash, no edema. .  Neuro: alert & orientedx3, cranial nerves grossly intact. moves all 4 extremities w/o difficulty. affect pleasant  ASSESSMENT & PLAN: 1. Chronic systolic HF: Nonischemic cardiomyopathy s/p St Jude  CRT-D.  EF down from 45-50% in June 2014 to 25-30%  12/2014  NYHA class II-III symptoms.  He is not volume overloaded. Weight is stable.  - Continue Lasix prn.  - Continue Coreg 6.25 mg bid.  - Continue digoxin 0.125 mg daily, check level.  - Restart lisinopril 2.5 mg bid, BMET in 10 days.  - Echo in 12/16, if EF remains low will plan for ICD. 2. Mechanical mitral valve:  On coumadin + aspirin 81 mg daily.   Goal INR 2.5-3.5.    3. GI Bleed: 03/2014, no BRBPR/melena. Continue PPI.   Marca Ancona 03/23/2015

## 2015-03-25 ENCOUNTER — Other Ambulatory Visit (HOSPITAL_COMMUNITY): Payer: Self-pay | Admitting: Anesthesiology

## 2015-03-25 ENCOUNTER — Telehealth: Payer: Self-pay | Admitting: Cardiology

## 2015-03-25 ENCOUNTER — Encounter: Payer: Medicare Other | Admitting: *Deleted

## 2015-03-25 NOTE — Telephone Encounter (Signed)
Spoke with pt and reminded pt of remote transmission that is due today. Pt verbalized understanding.   

## 2015-03-27 ENCOUNTER — Encounter: Payer: Self-pay | Admitting: Cardiology

## 2015-04-01 ENCOUNTER — Other Ambulatory Visit (HOSPITAL_COMMUNITY): Payer: Self-pay | Admitting: *Deleted

## 2015-04-01 ENCOUNTER — Ambulatory Visit (HOSPITAL_COMMUNITY)
Admission: RE | Admit: 2015-04-01 | Discharge: 2015-04-01 | Disposition: A | Discharge status: Home / Self Care | Payer: Medicare Other | Source: Ambulatory Visit | Attending: Cardiology | Admitting: Cardiology

## 2015-04-01 DIAGNOSIS — I5022 Chronic systolic (congestive) heart failure: Secondary | ICD-10-CM | POA: Diagnosis not present

## 2015-04-01 LAB — DIGOXIN LEVEL: Digoxin Level: 0.6 ng/mL — ABNORMAL LOW (ref 0.8–2.0)

## 2015-04-01 LAB — CBC
HCT: 38.1 % — ABNORMAL LOW (ref 39.0–52.0)
Hemoglobin: 12.3 g/dL — ABNORMAL LOW (ref 13.0–17.0)
MCH: 28.8 pg (ref 26.0–34.0)
MCHC: 32.3 g/dL (ref 30.0–36.0)
MCV: 89.2 fL (ref 78.0–100.0)
PLATELETS: 286 10*3/uL (ref 150–400)
RBC: 4.27 MIL/uL (ref 4.22–5.81)
RDW: 16.2 % — AB (ref 11.5–15.5)
WBC: 4 10*3/uL (ref 4.0–10.5)

## 2015-04-01 LAB — BASIC METABOLIC PANEL
ANION GAP: 8 (ref 5–15)
BUN: 14 mg/dL (ref 6–20)
CALCIUM: 8.9 mg/dL (ref 8.9–10.3)
CO2: 29 mmol/L (ref 22–32)
Chloride: 105 mmol/L (ref 101–111)
Creatinine, Ser: 1.48 mg/dL — ABNORMAL HIGH (ref 0.61–1.24)
GFR, EST AFRICAN AMERICAN: 53 mL/min — AB (ref >60)
GFR, EST NON AFRICAN AMERICAN: 46 mL/min — AB (ref >60)
GLUCOSE: 92 mg/dL (ref 65–99)
Potassium: 3.4 mmol/L — ABNORMAL LOW (ref 3.5–5.1)
Sodium: 142 mmol/L (ref 135–145)

## 2015-04-01 MED ORDER — ASPIRIN EC 81 MG PO TBEC: 81.0000 mg | DELAYED_RELEASE_TABLET | Freq: Every day | ORAL | Status: DC

## 2015-04-02 ENCOUNTER — Other Ambulatory Visit (HOSPITAL_COMMUNITY): Payer: Self-pay | Admitting: *Deleted

## 2015-04-02 MED ORDER — ASPIRIN EC 81 MG PO TBEC: 81.0000 mg | DELAYED_RELEASE_TABLET | Freq: Every day | ORAL | Status: DC

## 2015-04-02 MED ORDER — POTASSIUM CHLORIDE CRYS ER 20 MEQ PO TBCR: 20.0000 meq | EXTENDED_RELEASE_TABLET | Freq: Every day | ORAL | Status: DC

## 2015-04-09 ENCOUNTER — Encounter: Payer: Self-pay | Admitting: Internal Medicine

## 2015-04-11 DIAGNOSIS — Z952 Presence of prosthetic heart valve: Secondary | ICD-10-CM | POA: Diagnosis not present

## 2015-04-11 DIAGNOSIS — Z7901 Long term (current) use of anticoagulants: Secondary | ICD-10-CM | POA: Diagnosis not present

## 2015-04-11 DIAGNOSIS — Z760 Encounter for issue of repeat prescription: Secondary | ICD-10-CM | POA: Diagnosis not present

## 2015-04-12 ENCOUNTER — Other Ambulatory Visit (HOSPITAL_COMMUNITY): Payer: Self-pay | Admitting: *Deleted

## 2015-04-12 DIAGNOSIS — I5022 Chronic systolic (congestive) heart failure: Secondary | ICD-10-CM

## 2015-04-12 MED ORDER — CARVEDILOL 6.25 MG PO TABS: ORAL_TABLET | ORAL | Status: DC

## 2015-04-23 ENCOUNTER — Telehealth (HOSPITAL_COMMUNITY): Payer: Self-pay

## 2015-04-23 NOTE — Telephone Encounter (Signed)
C/o two days of legs itching.  No redness or swelling.  No rash.  No discolorization of feet Patient does have a PCP that I advised him to call for direction

## 2015-04-24 ENCOUNTER — Telehealth: Payer: Self-pay | Admitting: *Deleted

## 2015-04-24 NOTE — Telephone Encounter (Signed)
Received a telephone call from Mrs. Marchetta stating that the patient told her that his Coumadin was discontinued months ago and she needed to know if this statement was true from her husband.  Advised that on his last visit with his Cardiologist he was to continue taking Coumadin and Asprin.  She verbalized understanding and was given his Coumdin dose schedule, from when he was seen in April 2016.  Appointment set per wife and she verbalized understanding.

## 2015-05-01 ENCOUNTER — Ambulatory Visit (INDEPENDENT_AMBULATORY_CARE_PROVIDER_SITE_OTHER): Payer: Medicare Other | Admitting: Pharmacist

## 2015-05-01 DIAGNOSIS — I4892 Unspecified atrial flutter: Secondary | ICD-10-CM | POA: Diagnosis not present

## 2015-05-01 DIAGNOSIS — Z7901 Long term (current) use of anticoagulants: Secondary | ICD-10-CM

## 2015-05-01 LAB — POCT INR: INR: 1.9

## 2015-05-15 ENCOUNTER — Ambulatory Visit (INDEPENDENT_AMBULATORY_CARE_PROVIDER_SITE_OTHER): Payer: Medicare Other | Admitting: *Deleted

## 2015-05-15 DIAGNOSIS — Z7901 Long term (current) use of anticoagulants: Secondary | ICD-10-CM | POA: Diagnosis not present

## 2015-05-15 DIAGNOSIS — I4892 Unspecified atrial flutter: Secondary | ICD-10-CM | POA: Diagnosis not present

## 2015-05-15 LAB — POCT INR: INR: 3.2

## 2015-05-29 ENCOUNTER — Ambulatory Visit (INDEPENDENT_AMBULATORY_CARE_PROVIDER_SITE_OTHER): Payer: Medicare Other | Admitting: *Deleted

## 2015-05-29 DIAGNOSIS — I4892 Unspecified atrial flutter: Secondary | ICD-10-CM | POA: Diagnosis not present

## 2015-05-29 DIAGNOSIS — Z7901 Long term (current) use of anticoagulants: Secondary | ICD-10-CM | POA: Diagnosis not present

## 2015-05-29 LAB — POCT INR: INR: 3.6

## 2015-06-12 ENCOUNTER — Ambulatory Visit (INDEPENDENT_AMBULATORY_CARE_PROVIDER_SITE_OTHER): Payer: Medicare Other | Admitting: *Deleted

## 2015-06-12 DIAGNOSIS — Z7901 Long term (current) use of anticoagulants: Secondary | ICD-10-CM

## 2015-06-12 DIAGNOSIS — I4892 Unspecified atrial flutter: Secondary | ICD-10-CM | POA: Diagnosis not present

## 2015-06-12 LAB — POCT INR: INR: 4.2

## 2015-06-26 ENCOUNTER — Ambulatory Visit (INDEPENDENT_AMBULATORY_CARE_PROVIDER_SITE_OTHER): Payer: Medicare Other | Admitting: *Deleted

## 2015-06-26 DIAGNOSIS — I4892 Unspecified atrial flutter: Secondary | ICD-10-CM | POA: Diagnosis not present

## 2015-06-26 DIAGNOSIS — Z7901 Long term (current) use of anticoagulants: Secondary | ICD-10-CM

## 2015-06-26 LAB — POCT INR: INR: 2.6

## 2015-07-17 ENCOUNTER — Ambulatory Visit (INDEPENDENT_AMBULATORY_CARE_PROVIDER_SITE_OTHER): Payer: Medicare Other | Admitting: *Deleted

## 2015-07-17 DIAGNOSIS — Z7901 Long term (current) use of anticoagulants: Secondary | ICD-10-CM | POA: Diagnosis not present

## 2015-07-17 DIAGNOSIS — I4892 Unspecified atrial flutter: Secondary | ICD-10-CM | POA: Diagnosis not present

## 2015-07-17 LAB — POCT INR: INR: 1.4

## 2015-07-31 ENCOUNTER — Ambulatory Visit (INDEPENDENT_AMBULATORY_CARE_PROVIDER_SITE_OTHER): Payer: Medicare Other | Admitting: Pharmacist

## 2015-07-31 DIAGNOSIS — Z7901 Long term (current) use of anticoagulants: Secondary | ICD-10-CM | POA: Diagnosis not present

## 2015-07-31 DIAGNOSIS — I4892 Unspecified atrial flutter: Secondary | ICD-10-CM | POA: Diagnosis not present

## 2015-07-31 LAB — POCT INR: INR: 3.2

## 2015-08-14 ENCOUNTER — Ambulatory Visit (INDEPENDENT_AMBULATORY_CARE_PROVIDER_SITE_OTHER): Payer: Medicare Other | Admitting: *Deleted

## 2015-08-14 DIAGNOSIS — I4892 Unspecified atrial flutter: Secondary | ICD-10-CM

## 2015-08-14 DIAGNOSIS — Z7901 Long term (current) use of anticoagulants: Secondary | ICD-10-CM

## 2015-08-14 LAB — POCT INR: INR: 3.3

## 2015-08-23 DIAGNOSIS — N2581 Secondary hyperparathyroidism of renal origin: Secondary | ICD-10-CM | POA: Diagnosis not present

## 2015-08-23 DIAGNOSIS — N189 Chronic kidney disease, unspecified: Secondary | ICD-10-CM | POA: Diagnosis not present

## 2015-08-23 DIAGNOSIS — N183 Chronic kidney disease, stage 3 (moderate): Secondary | ICD-10-CM | POA: Diagnosis not present

## 2015-08-26 DIAGNOSIS — D631 Anemia in chronic kidney disease: Secondary | ICD-10-CM | POA: Diagnosis not present

## 2015-08-26 DIAGNOSIS — I4892 Unspecified atrial flutter: Secondary | ICD-10-CM | POA: Diagnosis not present

## 2015-08-26 DIAGNOSIS — Q2733 Arteriovenous malformation of digestive system vessel: Secondary | ICD-10-CM | POA: Diagnosis not present

## 2015-08-26 DIAGNOSIS — I429 Cardiomyopathy, unspecified: Secondary | ICD-10-CM | POA: Diagnosis not present

## 2015-08-26 DIAGNOSIS — I9589 Other hypotension: Secondary | ICD-10-CM | POA: Diagnosis not present

## 2015-08-26 DIAGNOSIS — N183 Chronic kidney disease, stage 3 (moderate): Secondary | ICD-10-CM | POA: Diagnosis not present

## 2015-08-26 DIAGNOSIS — N2581 Secondary hyperparathyroidism of renal origin: Secondary | ICD-10-CM | POA: Diagnosis not present

## 2015-08-26 DIAGNOSIS — Z8679 Personal history of other diseases of the circulatory system: Secondary | ICD-10-CM | POA: Diagnosis not present

## 2015-09-23 NOTE — Progress Notes (Addendum)
Patient ID: [name], male   DOB: April 21, 1947APOLLO TIMOTHYMRN: [id number]     Advanced Heart Failure Clinic Note    Primary Cardiologist: Dr. Gala Hubbard PCP: N/A EP: Dr [name]   HPI: [name] is a 70 year old male with h/o polysubstance abuse c/b MV endocarditis s/p St. Jude MVR, CHF due to NICM and PAF.  Underwent CRT-P implant for bradycardia and fatigue.   Follow up for Heart failure: He presents today as an add on for SOB. Last visit 03/2015.  Breathing has been stable, but more fatigued since last visit.  Has not been taking potassium.  Walks around a grocery store without needing to stop.  He states he can walk as long as he wants to on flat ground really, just gets more tired. No orthopnea, PND, or bendopnea. Occasional lightheadedness with rapid standing, not marked or limiting, 1-2 times a month.  No BRBPR or melena.   Labs (1/15): K 3.9, creatinine 1.56, hemoglobin 11 Labs (01/14/2015) K 3.2 Creatinine 1.55 BNP 412 Labs (01/30/15): k 3.8 Creatinine 1.47  Labs (8/16): K 4, creatinine 1.55, digoxin 0.6 Labs (9/16) K 3.4, creatinine 1.48  TTE in 2010 showed normal EF with elevated gradient across MV (mean 14) with perivalvular leak. Follow-up TEE in June 2010 with EF 40-45% mild stenosis mean gradient = 8. No vegetation or abscess.   Echo 8/12 EF 35% mild perivalvular MR.  Echo 3/13 EF 50% mild MVR stable with mild peri-valvular leak.  ECHO 12/16/2012 EF 50% stable MVR ECHO 12/2014 - EF 25-30%  Grade I DD, mechanical mitral valve with mean gradient 6 mmHg, no significant MR.   SH: Lives with his daughter's mom. No ETOH, tobacco abuse or drugs.  FH: Mom deceased: no cardiac issues        Father deceased: no cardiac issues.   ROS: All systems negative except as listed in HPI, PMH and Problem List.  Past Medical History  Diagnosis Date  . 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  a- Echo 07/2008 Ef 55% mild peroprosthetic MVR with High  transmitral gradient (mean 14).  b- normal coronaries by cath 12/2007  . Atrial fibrillation     post op.  s/p dc-cv 04/2008. previously on amiodarone  . Atrial flutter     atypical  . Heavy alcohol use     history  . IV drug abuse     history of  . CHF (congestive heart failure)     EF 35-40 % 2011 due to valvular disease and diastolic dysfunction  . History of GI bleed   . Hypertension   . GERD (gastroesophageal reflux disease)   . Chronic back pain   . AV block, 1st degree     .450 msec--progressive  . Dysphagia     with normal barium swallow 09/2008  . Pacemaker 2010    Current Outpatient Prescriptions  Medication Sig Dispense Refill  . aspirin EC 81 MG tablet Take 1 tablet (81 mg total) by mouth daily. 90 tablet 3  . carvedilol (COREG) 6.25 MG tablet TAKE 1 TABLET (6.25 MG TOTAL) BY MOUTH 2 (TWO) TIMES DAILY WITH A MEAL. 60 tablet 6  . digoxin (LANOXIN) 0.125 MG tablet Take 1 tablet (0.125 mg total) by mouth daily. 90 tablet 3  . furosemide (LASIX) 20 MG tablet Take 20 mg by mouth daily.    Marland Kitchen lisinopril (PRINIVIL,ZESTRIL) 2.5 MG tablet Take 1 tablet (2.5 mg total) by  mouth 2 (two) times daily. 60 tablet 3  . omeprazole (PRILOSEC) 20 MG capsule Take 20 mg by mouth daily.      Marland Kitchen warfarin (COUMADIN) 5 MG tablet Take 5-7.5 mg by mouth daily. Take 5 mg (1 tablet) daily except 7.5 mg (1 and 1/2 tablets) on Sunday    . aspirin-acetaminophen-caffeine (EXCEDRIN MIGRAINE) 250-250-65 MG per tablet Take 1 tablet by mouth every 6 (six) hours as needed for headache. Reported on 09/24/2015     No current facility-administered medications for this encounter.    Filed Vitals:   09/24/15 1352  BP: 98/64  Pulse: 89  Weight: 168 lb (76.204 kg)  SpO2: 98%    PHYSICAL EXAM: Gen: Elderly appearing. Walked into clinic with difficulty.  HEENT: normal Neck: supple. no JVD. Carotids 2+ bilat; no bruits. No  Cor: PMI nondisplaced. RRR. No rubs, gallops. mechanical s1. No murmur  appreciated Lungs: CTAB, normal effort Abdomen: soft, NT, ND, no HSM. No bruits or masses. +BS  Extremities: no cyanosis, clubbing, rash. Trace ankle edema.  Neuro: alert & orientedx3, cranial nerves grossly intact. moves all 4 extremities w/o difficulty. affect pleasant  ASSESSMENT & PLAN: 1. Chronic systolic HF: Nonischemic cardiomyopathy s/p St Jude  CRT-D.  EF down from 45-50% in June 2014 to 25-30%  12/2014  NYHA class II-III symptoms.  - Continue Lasix 20 mg daily.  - Continue Coreg 6.25 mg bid.  - Continue digoxin 0.125 mg daily, check level.  - Continue lisinopril 2.5 mg BID.  - Overdue Echo.  Will order.  If EF remains < 35% will need ICD consideration - No room to uptitrate with soft BP.  2. Mechanical mitral valve:  On coumadin + aspirin 81 mg daily.   Goal INR 2.5-3.5.    - Will assess on future echo.  3. GI Bleed: 03/2014,  - no BRBPR/melena. Continue PPI.  4. HTN - Soft on current regimen. No uptitration.    BMET/BNP today.  Repeat Echo, results to Dr. Gala Hubbard, EF remains < 35% will need to see EP for possible ICD upgrade (currently just CRT-P)  Follow up 1 month on NP/PA side and MD after that.   Richard Dollar Ashaki Frosch PA-C 09/24/2015   Total time spent 30 minutes. Over half that time spent discussing the above.

## 2015-09-24 ENCOUNTER — Ambulatory Visit (HOSPITAL_COMMUNITY)
Admission: RE | Admit: 2015-09-24 | Discharge: 2015-09-24 | Disposition: A | Discharge status: Home / Self Care | Payer: Medicare Other | Source: Ambulatory Visit | Attending: Cardiology | Admitting: Cardiology

## 2015-09-24 VITALS — BP 98/64 | HR 89 | Wt 168.0 lb

## 2015-09-24 DIAGNOSIS — I110 Hypertensive heart disease with heart failure: Secondary | ICD-10-CM | POA: Insufficient documentation

## 2015-09-24 DIAGNOSIS — Z950 Presence of cardiac pacemaker: Secondary | ICD-10-CM

## 2015-09-24 DIAGNOSIS — I10 Essential (primary) hypertension: Secondary | ICD-10-CM

## 2015-09-24 DIAGNOSIS — K922 Gastrointestinal hemorrhage, unspecified: Secondary | ICD-10-CM

## 2015-09-24 DIAGNOSIS — I059 Rheumatic mitral valve disease, unspecified: Secondary | ICD-10-CM | POA: Diagnosis not present

## 2015-09-24 DIAGNOSIS — I5022 Chronic systolic (congestive) heart failure: Secondary | ICD-10-CM | POA: Insufficient documentation

## 2015-09-24 LAB — BASIC METABOLIC PANEL
Anion gap: 13 (ref 5–15)
BUN: 15 mg/dL (ref 6–20)
CHLORIDE: 105 mmol/L (ref 101–111)
CO2: 23 mmol/L (ref 22–32)
CREATININE: 1.24 mg/dL (ref 0.61–1.24)
Calcium: 9.1 mg/dL (ref 8.9–10.3)
GFR calc non Af Amer: 57 mL/min — ABNORMAL LOW (ref >60)
Glucose, Bld: 90 mg/dL (ref 65–99)
POTASSIUM: 3.7 mmol/L (ref 3.5–5.1)
Sodium: 141 mmol/L (ref 135–145)

## 2015-09-24 LAB — BRAIN NATRIURETIC PEPTIDE: B NATRIURETIC PEPTIDE 5: 98.6 pg/mL (ref 0.0–100.0)

## 2015-09-24 NOTE — Patient Instructions (Signed)
Labs today  Your physician has requested that you have an echocardiogram. Echocardiography is a painless test that uses sound waves to create images of your heart. It provides your doctor with information about the size and shape of your heart and how well your heart's chambers and valves are working. This procedure takes approximately one hour. There are no restrictions for this procedure.  Your physician recommends that you schedule a follow-up appointment in: 1 month in the [facility]

## 2015-09-24 NOTE — Progress Notes (Signed)
Advanced Heart Failure Medication Review by a Pharmacist  Does the patient  feel that his/her medications are working for him/her?  yes  Has the patient been experiencing any side effects to the medications prescribed?  no  Does the patient measure his/her own blood pressure or blood glucose at home?  no   Does the patient have any problems obtaining medications due to transportation or finances?   no  Understanding of regimen: good Understanding of indications: good Potential of compliance: good Patient understands to avoid NSAIDs. Patient understands to avoid decongestants.  Issues to address at subsequent visits: None   Pharmacist comments:  [name] is a pleasant 70 yo M presenting with a family member and his medication bottles. Most of his bottles were in date but I did note that his Rx for lasix was for 20 mg daily not PRN as previously noted and he did not have any potassium. He did not have any other medication-related questions or concerns for me at this time.   [name], PharmD, BCPS, CPP Clinical Pharmacist Pager: [phone] Phone: [phone] 09/24/2015 2:07 PM      Time with patient: 10 minutes Preparation and documentation time: 2 minutes Total time: 12 minutes

## 2015-10-01 ENCOUNTER — Ambulatory Visit (HOSPITAL_COMMUNITY): Admission: RE | Admit: 2015-10-01 | Payer: Medicare Other | Source: Ambulatory Visit

## 2015-10-02 ENCOUNTER — Ambulatory Visit (HOSPITAL_COMMUNITY)
Admission: RE | Admit: 2015-10-02 | Discharge: 2015-10-02 | Disposition: A | Discharge status: Home / Self Care | Payer: Medicare Other | Source: Ambulatory Visit | Attending: Internal Medicine | Admitting: Internal Medicine

## 2015-10-02 DIAGNOSIS — I440 Atrioventricular block, first degree: Secondary | ICD-10-CM | POA: Diagnosis not present

## 2015-10-02 DIAGNOSIS — Z952 Presence of prosthetic heart valve: Secondary | ICD-10-CM | POA: Insufficient documentation

## 2015-10-02 DIAGNOSIS — I351 Nonrheumatic aortic (valve) insufficiency: Secondary | ICD-10-CM | POA: Insufficient documentation

## 2015-10-02 DIAGNOSIS — I5022 Chronic systolic (congestive) heart failure: Secondary | ICD-10-CM | POA: Diagnosis not present

## 2015-10-02 DIAGNOSIS — I110 Hypertensive heart disease with heart failure: Secondary | ICD-10-CM | POA: Insufficient documentation

## 2015-10-02 DIAGNOSIS — I509 Heart failure, unspecified: Secondary | ICD-10-CM | POA: Diagnosis present

## 2015-10-02 NOTE — Progress Notes (Signed)
  Echocardiogram 2D Echocardiogram has been performed.  Leta Jungling M 10/02/2015, 10:32 AM

## 2015-10-03 ENCOUNTER — Other Ambulatory Visit (HOSPITAL_COMMUNITY): Payer: Self-pay | Admitting: Cardiology

## 2015-10-24 ENCOUNTER — Encounter (HOSPITAL_COMMUNITY): Payer: Self-pay

## 2015-10-24 ENCOUNTER — Encounter: Payer: Self-pay | Admitting: Licensed Clinical Social Worker

## 2015-10-24 ENCOUNTER — Ambulatory Visit (HOSPITAL_COMMUNITY)
Admission: RE | Admit: 2015-10-24 | Discharge: 2015-10-24 | Disposition: A | Discharge status: Home / Self Care | Payer: Medicare Other | Source: Ambulatory Visit | Attending: Cardiology | Admitting: Cardiology

## 2015-10-24 VITALS — BP 114/62 | HR 75 | Wt 165.8 lb

## 2015-10-24 DIAGNOSIS — Z7901 Long term (current) use of anticoagulants: Secondary | ICD-10-CM | POA: Insufficient documentation

## 2015-10-24 DIAGNOSIS — K219 Gastro-esophageal reflux disease without esophagitis: Secondary | ICD-10-CM | POA: Insufficient documentation

## 2015-10-24 DIAGNOSIS — Z952 Presence of prosthetic heart valve: Secondary | ICD-10-CM | POA: Insufficient documentation

## 2015-10-24 DIAGNOSIS — I5022 Chronic systolic (congestive) heart failure: Secondary | ICD-10-CM | POA: Diagnosis not present

## 2015-10-24 DIAGNOSIS — Z7982 Long term (current) use of aspirin: Secondary | ICD-10-CM | POA: Insufficient documentation

## 2015-10-24 DIAGNOSIS — I428 Other cardiomyopathies: Secondary | ICD-10-CM | POA: Insufficient documentation

## 2015-10-24 DIAGNOSIS — I480 Paroxysmal atrial fibrillation: Secondary | ICD-10-CM | POA: Diagnosis not present

## 2015-10-24 DIAGNOSIS — I110 Hypertensive heart disease with heart failure: Secondary | ICD-10-CM | POA: Insufficient documentation

## 2015-10-24 DIAGNOSIS — Z95810 Presence of automatic (implantable) cardiac defibrillator: Secondary | ICD-10-CM | POA: Insufficient documentation

## 2015-10-24 DIAGNOSIS — R413 Other amnesia: Secondary | ICD-10-CM | POA: Insufficient documentation

## 2015-10-24 DIAGNOSIS — Z79899 Other long term (current) drug therapy: Secondary | ICD-10-CM | POA: Diagnosis not present

## 2015-10-24 NOTE — Progress Notes (Signed)
Patient ID: [name], male   DOB: 02-12-1946, 70 y.o.   MRN: [id number]     Advanced Heart Failure Clinic Note    Primary Cardiologist: Dr. Gala Romney PCP: N/A EP: Dr Graciela Husbands   HPI: [name] is a 70 year old male with h/o polysubstance abuse c/b MV endocarditis s/p St. Jude MVR, CHF due to NICM and PAF.  Underwent CRT-P implant for bradycardia and fatigue.   Follow up for Heart failure: Overall feeling better. Denies SOB/PND/Orthopnea. Has dizziness every now and then. His wife says he has difficulty with his memory. Some times he forgets where he has been.  Does admit to fatigue.  Weight at home 178 pounds. Taking all medications. No BRBPR.    Labs (1/15): K 3.9, creatinine 1.56, hemoglobin 11 Labs (01/14/2015) K 3.2 Creatinine 1.55 BNP 412 Labs (01/30/15): k 3.8 Creatinine 1.47  Labs (8/16): K 4, creatinine 1.55, digoxin 0.6 Labs (9/16) K 3.4, creatinine 1.48 Labs (09/24/2015) K 3.7 Creatinine 1.24   TTE in 2010 showed normal EF with elevated gradient across MV (mean 14) with perivalvular leak. Follow-up TEE in June 2010 with EF 40-45% mild stenosis mean gradient = 8. No vegetation or abscess.   Echo 8/12 EF 35% mild perivalvular MR.  Echo 3/13 EF 50% mild MVR stable with mild peri-valvular leak.  ECHO 12/16/2012 EF 50% stable MVR ECHO 12/2014 - EF 25-30%  Grade I DD, mechanical mitral valve with mean gradient 6 mmHg, no significant MR.  ECHO 09/2015- EF 35-40%. Grade I DD Mitral Valve ok no stenosis  SH: Lives with his daughter's mom. No ETOH, tobacco abuse or drugs.  FH: Mom deceased: no cardiac issues        Father deceased: no cardiac issues.   ROS: All systems negative except as listed in HPI, PMH and Problem List.  Past Medical History  Diagnosis Date  . 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  a- Echo 07/2008 Ef 55% mild peroprosthetic MVR with High transmitral gradient (mean 14).  b- normal coronaries by cath 12/2007  . Atrial  fibrillation (HCC)     post op.  s/p dc-cv 04/2008. previously on amiodarone  . Atrial flutter (HCC)     atypical  . Heavy alcohol use     history  . IV drug abuse     history of  . CHF (congestive heart failure) (HCC)     EF 35-40 % 2011 due to valvular disease and diastolic dysfunction  . History of GI bleed   . Hypertension   . GERD (gastroesophageal reflux disease)   . Chronic back pain   . AV block, 1st degree     .450 msec--progressive  . Dysphagia     with normal barium swallow 09/2008  . Pacemaker 2010    Current Outpatient Prescriptions  Medication Sig Dispense Refill  . aspirin EC 81 MG tablet Take 1 tablet (81 mg total) by mouth daily. 90 tablet 3  . aspirin-acetaminophen-caffeine (EXCEDRIN MIGRAINE) 250-250-65 MG per tablet Take 1 tablet by mouth every 6 (six) hours as needed for headache. Reported on 09/24/2015    . carvedilol (COREG) 6.25 MG tablet TAKE 1 TABLET (6.25 MG TOTAL) BY MOUTH 2 (TWO) TIMES DAILY WITH A MEAL. 60 tablet 6  . digoxin (LANOXIN) 0.125 MG tablet Take 1 tablet (0.125 mg total) by mouth daily. 90 tablet 3  . furosemide (LASIX) 20 MG tablet Take 20 mg by mouth daily.    Marland Kitchen  lisinopril (PRINIVIL,ZESTRIL) 2.5 MG tablet TAKE 1 TABLET (2.5 MG TOTAL) BY MOUTH 2 (TWO) TIMES DAILY. 60 tablet 3  . omeprazole (PRILOSEC) 20 MG capsule Take 20 mg by mouth daily.      Marland Kitchen warfarin (COUMADIN) 5 MG tablet Take 5-7.5 mg by mouth daily. Take 5 mg (1 tablet) daily except 7.5 mg (1 and 1/2 tablets) on Sunday     No current facility-administered medications for this encounter.    Filed Vitals:   10/24/15 1427  BP: 114/62  Pulse: 75  Weight: 165 lb 12 oz (75.184 kg)  SpO2: 100%    PHYSICAL EXAM: Gen: Elderly appearing. Walked into clinic with difficulty.  HEENT: normal Neck: supple. no JVD. Carotids 2+ bilat; no bruits. No  Cor:  RRR. No rubs, gallops. mechanical s1. No murmur appreciated Lungs: CTAB, normal effort Abdomen: soft, NT, ND, no HSM. No bruits or  masses. +BS  Extremities: no cyanosis, clubbing, rash. No edema.   Neuro: alert & orientedx3, cranial nerves grossly intact. moves all 4 extremities w/o difficulty. affect pleasant  ASSESSMENT & PLAN: 1. Chronic systolic HF: Nonischemic cardiomyopathy s/p St Jude  CRT-D.  March 2017 EF improved 35-40%.  NYHA class II symptoms. Volume status stable. - Continue Lasix 20 mg daily.  - Continue Coreg 6.25 mg bid.  - Continue digoxin 0.125 mg daily  - Continue lisinopril 2.5 mg BID. Would not change to entresto with soft BP and intermittent diizziness.  2. Mechanical mitral valve: Valve ok on ECHO. On coumadin + aspirin 81 mg daily.   Goal INR 2.5-3.5.   INR followed at the coumadin clinic. 3. GI Bleed: 03/2014,  - no BRBPR/melena. Continue PPI.  4. HTN - Stable. No uptitration.  5. Memory Impairment- Needs PCP to work up.    Refer to HF SW for PCP.   Follow up 2 months. Check dig level next visit. Marland Kitchen   Amy Clegg NP-C  10/24/2015

## 2015-10-24 NOTE — Progress Notes (Signed)
CSW referred to assist with PCP. Patient reports he previously went to a clinic which is now closed. Patient's caregiver mentioned she goes to [facility] but he would prefer to remain in the [facility] and requested CSW to contact IM clinic for an appointment. CSW left message for IM clinic and will contact patient with appointment once made. Patient verbalizes understanding and appreciative of the assistance. CSW will follow up. Lasandra Beech, LCSW [phone]

## 2015-10-24 NOTE — Patient Instructions (Signed)
Your physician recommends that you schedule a follow-up appointment in: 2 months  

## 2015-10-31 ENCOUNTER — Telehealth: Payer: Self-pay | Admitting: Licensed Clinical Social Worker

## 2015-10-31 NOTE — Telephone Encounter (Signed)
CSW contacted patient's family to inform of appointment in IM clinic on May 10 at 10:15am. Family grateful for the appointment as she stated already have other family members going there and "will only have to go one place". CSW available as needed. Lasandra Beech, LCSW [phone]

## 2015-11-13 ENCOUNTER — Ambulatory Visit (INDEPENDENT_AMBULATORY_CARE_PROVIDER_SITE_OTHER): Payer: Medicare Other | Admitting: Internal Medicine

## 2015-11-13 ENCOUNTER — Encounter: Payer: Self-pay | Admitting: Internal Medicine

## 2015-11-13 VITALS — BP 107/59 | HR 78 | Temp 98.7°F | Ht 68.0 in | Wt 168.6 lb

## 2015-11-13 DIAGNOSIS — I5022 Chronic systolic (congestive) heart failure: Secondary | ICD-10-CM

## 2015-11-13 DIAGNOSIS — Z23 Encounter for immunization: Secondary | ICD-10-CM

## 2015-11-13 DIAGNOSIS — Z952 Presence of prosthetic heart valve: Secondary | ICD-10-CM

## 2015-11-13 DIAGNOSIS — Z7901 Long term (current) use of anticoagulants: Secondary | ICD-10-CM

## 2015-11-13 DIAGNOSIS — R3911 Hesitancy of micturition: Secondary | ICD-10-CM | POA: Diagnosis not present

## 2015-11-13 DIAGNOSIS — R351 Nocturia: Secondary | ICD-10-CM

## 2015-11-13 DIAGNOSIS — N3943 Post-void dribbling: Secondary | ICD-10-CM

## 2015-11-13 DIAGNOSIS — N400 Enlarged prostate without lower urinary tract symptoms: Secondary | ICD-10-CM

## 2015-11-13 DIAGNOSIS — R350 Frequency of micturition: Secondary | ICD-10-CM | POA: Diagnosis not present

## 2015-11-13 DIAGNOSIS — Z0000 Encounter for general adult medical examination without abnormal findings: Secondary | ICD-10-CM | POA: Insufficient documentation

## 2015-11-13 DIAGNOSIS — N401 Enlarged prostate with lower urinary tract symptoms: Secondary | ICD-10-CM | POA: Insufficient documentation

## 2015-11-13 LAB — POCT INR: INR: 1.1

## 2015-11-13 MED ORDER — FINASTERIDE 5 MG PO TABS: 5.0000 mg | ORAL_TABLET | Freq: Every day | ORAL | Status: DC

## 2015-11-13 NOTE — Assessment & Plan Note (Signed)
His urinary frequency, nocturia, hesitant stream, and post-void dribbling sound most like benign prostatic hypertrophy. I'm hesitant to start an alpha blocker because he's already borderline hypotensive, so I've started finasteride 5mg  daily and told him this will take about 3-6 months to start working. At our next visit, I think we can consider dropping the furosemide to an as-needed dose because he was euvolemic on my exam today.

## 2015-11-13 NOTE — Assessment & Plan Note (Addendum)
His INR is subtherapeutic today at 1.1, with a goal between 2.5-3.5 given his prosthetic mitral valve, so we have some work to do. For the last two months, he has been taking warfarin 5 mg six days a week and 7.5mg  on Sundays. He has not had his INR checked in the last 2 months.  Today, our pharmacists have helped formulate a plan. Because his weekly dose is 37.5mg , we'll increase the weekly dose by 20%, putting it around 45mg  weekly. He'll take 7.5mg  nightly until he follows in the warfarin clinic on Monday. I've also stopped his aspirin because he does not have coronary artery disease nor history of a stroke and he has had a gastrointestinal bleed in the past.

## 2015-11-13 NOTE — Assessment & Plan Note (Signed)
He got his TDAP and PCV 13 vaccines today.

## 2015-11-13 NOTE — Progress Notes (Signed)
Internal Medicine Clinic Attending  Case discussed with Dr. Ford at the time of the visit.  We reviewed the resident's history and exam and pertinent patient test results.  I agree with the assessment, diagnosis, and plan of care documented in the resident's note.  

## 2015-11-13 NOTE — Assessment & Plan Note (Addendum)
From what I gather on chart review, his reduced ejection fraction appears to be solely from his mitral valve replacement from endocarditis years ago. He doesn't have any evidence of coronary artery disease, is on warfarin, and has had a gastrointestinal bleed in the past, so I've stopped his aspirin and messaged the Heart Failure team to double check that's okay with them. He appears eu to hypovolemic today on his current regimen so we'll hold the course. I think we can consider changing the furosemide to an as-needed basis at his next visit if he continues to appear euvolemic.

## 2015-11-13 NOTE — Progress Notes (Signed)
Patient ID: [name], male   DOB: Oct 04, 1945, 70 y.o.   MRN: [id number] [name] Subjective:   Patient ID: [name] male   DOB: Oct 25, 1945 70 y.o.   MRN: [id number]  HPI: [name] is a 70 y.o. male with nonischemic heart failure with reduced ejection fraction of 35-40% thought to be from a periprosthetic mitral valve replacement from endocarditis, history of gastrointestinal bleed from arteriovenous malformation status post clipping, here to establish in our clinic and also complaining of urinary frequency.  Urinary frequency: He uses the bathroom about once every 2 hours a day, and awakens 3-4 times at night. He has a difficulty initiating his urinary stream, and has postvoid dribbling. He does not have a family history of prostate cancer.  Heart failure with reduced ejection fraction: He sometimes feels lightheaded upon standing, but denies orthopnea or lower extremity swelling.  History of prosthetic mitral valve replacement on warfarin: He has been taking warfarin 5 mg 6 days a week, and 7.5 mg on Sundays. Nobody has been following his INR since February 2017.  Past Medical History  Diagnosis Date  . 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  a- Echo 07/2008 Ef 55% mild peroprosthetic MVR with High transmitral gradient (mean 14).  b- normal coronaries by cath 12/2007  . Atrial fibrillation (HCC)     post op.  s/p dc-cv 04/2008. previously on amiodarone  . Atrial flutter (HCC)     atypical  . Heavy alcohol use     history  . IV drug abuse     history of  . CHF (congestive heart failure) (HCC)     EF 35-40 % 2011 due to valvular disease and diastolic dysfunction  . History of GI bleed   . Hypertension   . GERD (gastroesophageal reflux disease)   . Chronic back pain   . AV block, 1st degree     .450 msec--progressive  . Dysphagia     with normal barium swallow 09/2008  . Pacemaker 2010   Current  Outpatient Prescriptions  Medication Sig Dispense Refill  . carvedilol (COREG) 6.25 MG tablet TAKE 1 TABLET (6.25 MG TOTAL) BY MOUTH 2 (TWO) TIMES DAILY WITH A MEAL. 60 tablet 6  . digoxin (LANOXIN) 0.125 MG tablet Take 1 tablet (0.125 mg total) by mouth daily. 90 tablet 3  . finasteride (PROSCAR) 5 MG tablet Take 1 tablet (5 mg total) by mouth daily. 90 tablet 3  . furosemide (LASIX) 20 MG tablet Take 20 mg by mouth daily.    Marland Kitchen lisinopril (PRINIVIL,ZESTRIL) 2.5 MG tablet TAKE 1 TABLET (2.5 MG TOTAL) BY MOUTH 2 (TWO) TIMES DAILY. 60 tablet 3  . warfarin (COUMADIN) 5 MG tablet Take 5-7.5 mg by mouth daily. Take 5 mg (1 tablet) daily except 7.5 mg (1 and 1/2 tablets) on Sunday     No current facility-administered medications for this visit.   Family History  Problem Relation Age of Onset  . Coronary artery disease Mother    Social History   Social History  . Marital Status: Divorced    Spouse Name: N/A  . Number of Children: 9  . Years of Education: N/A   Occupational History  . retired    Social History Main Topics  . Smoking status: Former Smoker -- 0 years    Types: Cigarettes  . Smokeless tobacco: Never Used  . Alcohol Use: No  . Drug Use: No  .  Sexual Activity: Not Asked   Other Topics Concern  . None   Social History Narrative   Exercise everyday riding a bike for 1 hour   Review of Systems  Constitutional: Negative for fever, chills, weight loss and malaise/fatigue.  Respiratory: Negative for wheezing.   Cardiovascular: Negative for chest pain, palpitations, orthopnea and leg swelling.  Genitourinary: Positive for frequency. Negative for dysuria, urgency and hematuria.  Musculoskeletal: Negative for falls.  Skin: Negative for rash.  Neurological: Positive for dizziness. Negative for loss of consciousness.   Objective:  Physical Exam: Filed Vitals:   11/13/15 1016  BP: 107/59  Pulse: 78  Temp: 98.7 F (37.1 C)  TempSrc: Oral  Height: 5\' 8"  (1.727 m)   Weight: 168 lb 9.6 oz (76.476 kg)  SpO2: 99%   General: resting in bed comfortably, appropriately conversational HEENT: no scleral icterus, extra-ocular muscles intact, oropharynx without lesions Cardiac: irregular rate and rhythm, diastolic click, no other murmurs Pulm: breathing well, clear to auscultation bilaterally Abd: bowel sounds normal, soft, nondistended, non-tender Ext: warm and well perfused, without pedal edema Skin: no rash, hair, or nail changes Neuro: alert and oriented X3, cranial nerves II-XII grossly intact, moving all extremities well  Assessment & Plan:  Case discussed with Dr. Heide Spark  H/O mitral valve replacement with mechanical valve His INR is subtherapeutic today at 1.1, with a goal between 2.5-3.5 given his prosthetic mitral valve, so we have some work to do. For the last two months, he has been taking warfarin 5 mg six days a week and 7.5mg  on Sundays. He has not had his INR checked in the last 2 months.  Today, our pharmacists have helped formulate a plan. Because his weekly dose is 37.5mg , we'll increase the weekly dose by 20%, putting it around 45mg  weekly. He'll take 7.5mg  nightly until he follows in the warfarin clinic on Monday. I've also stopped his aspirin because he does not have coronary artery disease nor history of a stroke and he has had a gastrointestinal bleed in the past.  Benign prostatic hypertrophy His urinary frequency, nocturia, hesitant stream, and post-void dribbling sound most like benign prostatic hypertrophy. I'm hesitant to start an alpha blocker because he's already borderline hypotensive, so I've started finasteride 5mg  daily and told him this will take about 3-6 months to start working. At our next visit, I think we can consider dropping the furosemide to an as-needed dose because he was euvolemic on my exam today.  Healthcare maintenance He got his TDAP and PCV 13 vaccines today.  Chronic systolic heart failure From what I  gather on chart review, his reduced ejection fraction appears to be solely from his mitral valve replacement from endocarditis years ago. He doesn't have any evidence of coronary artery disease, is on warfarin, and has had a gastrointestinal bleed in the past, so I've stopped his aspirin and messaged the Heart Failure team to double check that's okay with them. He appears eu to hypovolemic today on his current regimen so we'll hold the course. I think we can consider changing the furosemide to an as-needed basis at his next visit if he continues to appear euvolemic.   Medications Ordered Meds ordered this encounter  Medications  . finasteride (PROSCAR) 5 MG tablet    Sig: Take 1 tablet (5 mg total) by mouth daily.    Dispense:  90 tablet    Refill:  3   Other Orders Orders Placed This Encounter  Procedures  . Tdap vaccine greater than or  equal to 7yo IM  . Pneumococcal conjugate vaccine 13-valent  . POCT INR   Follow Up: Return in about 3 months (around 02/13/2016).

## 2015-11-18 ENCOUNTER — Ambulatory Visit (INDEPENDENT_AMBULATORY_CARE_PROVIDER_SITE_OTHER): Payer: Medicare Other | Admitting: Pharmacist

## 2015-11-18 DIAGNOSIS — Z954 Presence of other heart-valve replacement: Secondary | ICD-10-CM | POA: Diagnosis present

## 2015-11-18 DIAGNOSIS — Z7901 Long term (current) use of anticoagulants: Secondary | ICD-10-CM | POA: Diagnosis not present

## 2015-11-18 DIAGNOSIS — Z952 Presence of prosthetic heart valve: Secondary | ICD-10-CM

## 2015-11-18 LAB — POCT INR: INR: 2

## 2015-11-18 NOTE — Patient Instructions (Signed)
Patient instructed to take medications as defined in the Anti-coagulation Track section of this encounter.  Patient instructed to take today's dose.  Patient verbalized understanding of these instructions.    

## 2015-11-18 NOTE — Progress Notes (Signed)
Anti-Coagulation Progress Note  [name] is a 70 y.o. male who is currently on an anti-coagulation regimen.    RECENT RESULTS: Recent results are below, the most recent result is correlated with a dose of 52.5 mg. per week: Lab Results  Component Value Date   INR 2.0 11/18/2015   INR 1.1 11/13/2015   INR 3.3 08/14/2015    ANTI-COAG DOSE: Anticoagulation Dose Instructions as of 11/18/2015      Glynis Smiles Tue Wed Thu Fri Sat   New Dose 7.5 mg 7.5 mg 10 mg 10 mg 10 mg 7.5 mg 7.5 mg       ANTICOAG SUMMARY: Anticoagulation Episode Summary    Current INR goal 2.5-3.5  Next INR check 11/25/2015  INR from last check 2.0 (11/18/2015)  Goal at result date 2.0-3.0  Weekly max dose   Target end date Indefinite  [facility]  Preferred lab   Send INR reminders to [facility]AG [facility]   Indications  ATRIAL FLUTTER (Resolved) [I48.92] IRREGULAR PULSE [I49.9] Long term (current) use of anticoagulants (Resolved) [Z79.01]        Comments       Anticoagulation Care Providers    Provider Role Specialty Phone number   [name], MD  Cardiology [phone]      ANTICOAG TODAY: Anticoagulation Summary as of 11/18/2015    INR goal 2.5-3.5  Prior goal 2.0-3.0  Selected INR 2.0 (11/18/2015)  Next INR check 11/25/2015  Target end date Indefinite   Indications  ATRIAL FLUTTER (Resolved) [I48.92] IRREGULAR PULSE [I49.9] Long term (current) use of anticoagulants (Resolved) [Z79.01]      Anticoagulation Episode Summary    [facility]   Preferred lab    Send INR reminders to [facility] [address]   Comments     Anticoagulation Care Providers    Provider Role Specialty Phone number   [name], MD  Cardiology [phone]      PATIENT INSTRUCTIONS: Patient Instructions  Patient instructed to take medications as defined in the Anti-coagulation Track section of this encounter.  Patient instructed to  take today's dose.  Patient verbalized understanding of these instructions.       FOLLOW-UP Return in 7 days (on 11/25/2015) for Follow up INR at 1030h.  Hulen Luster, III Pharm.D., CACP

## 2015-11-19 NOTE — Progress Notes (Signed)
INTERNAL MEDICINE TEACHING ATTENDING ADDENDUM - Earl Lagos M.D  Duration- indefinite, Indication- aflutter, INR- therapeutic. Agree with pharmacy recommendations as outlined in their note.

## 2015-11-25 ENCOUNTER — Other Ambulatory Visit: Payer: Self-pay

## 2015-11-25 ENCOUNTER — Ambulatory Visit (INDEPENDENT_AMBULATORY_CARE_PROVIDER_SITE_OTHER): Payer: Medicare Other | Admitting: Pharmacist

## 2015-11-25 DIAGNOSIS — Z7901 Long term (current) use of anticoagulants: Secondary | ICD-10-CM | POA: Diagnosis present

## 2015-11-25 DIAGNOSIS — Z954 Presence of other heart-valve replacement: Secondary | ICD-10-CM | POA: Diagnosis not present

## 2015-11-25 LAB — POCT INR: INR: 2.5

## 2015-11-25 MED ORDER — FUROSEMIDE 20 MG PO TABS: 20.0000 mg | ORAL_TABLET | Freq: Every day | ORAL | Status: DC

## 2015-11-25 NOTE — Progress Notes (Signed)
Anti-Coagulation Progress Note  [name] is a 70 y.o. male who is currently on an anti-coagulation regimen.    RECENT RESULTS: Recent results are below, the most recent result is correlated with a dose of 60 mg. per week: Lab Results  Component Value Date   INR 2.50 11/25/2015   INR 2.0 11/18/2015   INR 1.1 11/13/2015    ANTI-COAG DOSE: Anticoagulation Dose Instructions as of 11/25/2015      Glynis Smiles Tue Wed Thu Fri Sat   New Dose 10 mg 10 mg 10 mg 10 mg 10 mg 10 mg 10 mg       ANTICOAG SUMMARY: Anticoagulation Episode Summary    Current INR goal 2.5-3.5  Next INR check 12/09/2015  INR from last check 2.50 (11/25/2015)  Weekly max dose   Target end date Indefinite  [facility]  Preferred lab   Send INR reminders to [facility]   Indications  ATRIAL FLUTTER (Resolved) [I48.92] IRREGULAR PULSE [I49.9] Long term (current) use of anticoagulants (Resolved) [Z79.01]        Comments       Anticoagulation Care Providers    Provider Role Specialty Phone number   [name], MD  Cardiology [phone]      ANTICOAG TODAY: Anticoagulation Summary as of 11/25/2015    INR goal 2.5-3.5  Selected INR 2.50 (11/25/2015)  Next INR check 12/09/2015  Target end date Indefinite   Indications  ATRIAL FLUTTER (Resolved) [I48.92] IRREGULAR PULSE [I49.9] Long term (current) use of anticoagulants (Resolved) [Z79.01]      Anticoagulation Episode Summary    [facility]   Preferred lab    Send INR reminders to [facility] [address]   Comments     Anticoagulation Care Providers    Provider Role Specialty Phone number   [name], MD  Cardiology [phone]      PATIENT INSTRUCTIONS: Patient Instructions  Patient instructed to take medications as defined in the Anti-coagulation Track section of this encounter.  Patient instructed to take today's dose.  Patient verbalized understanding of  these instructions.       FOLLOW-UP Return in 2 weeks (on 12/09/2015) for Follow up INR at 1030h.  Hulen Luster, III Pharm.D., CACP

## 2015-11-25 NOTE — Patient Instructions (Signed)
Patient instructed to take medications as defined in the Anti-coagulation Track section of this encounter.  Patient instructed to take today's dose.  Patient verbalized understanding of these instructions.    

## 2015-11-25 NOTE — Telephone Encounter (Signed)
It was discovered at recent anti-coag visit that patient had combined his furosemide and finasteride tablets into one bottle.  Tablets look similar so we are unable to separate them out.  Patients bottle taken and he was directed to pharmacy to pick up 2 new bottles with instruction not to combine medications.

## 2015-11-25 NOTE — Progress Notes (Signed)
INTERNAL MEDICINE TEACHING ATTENDING ADDENDUM - Earl Lagos M.D  Duration- indefinite, Indication- mechanical mitral valve, INR- therapeutic. Agree with pharmacy recommendations as outlined in their note.

## 2015-12-09 ENCOUNTER — Ambulatory Visit: Payer: Medicare Other

## 2015-12-24 ENCOUNTER — Ambulatory Visit (HOSPITAL_COMMUNITY)
Admission: RE | Admit: 2015-12-24 | Discharge: 2015-12-24 | Disposition: A | Discharge status: Home / Self Care | Payer: Medicare Other | Source: Ambulatory Visit | Attending: Internal Medicine | Admitting: Internal Medicine

## 2015-12-24 ENCOUNTER — Other Ambulatory Visit (HOSPITAL_COMMUNITY): Payer: Self-pay | Admitting: Adult Health

## 2015-12-24 VITALS — BP 120/62 | HR 80 | Wt 172.0 lb

## 2015-12-24 DIAGNOSIS — R413 Other amnesia: Secondary | ICD-10-CM | POA: Insufficient documentation

## 2015-12-24 DIAGNOSIS — Z7901 Long term (current) use of anticoagulants: Secondary | ICD-10-CM | POA: Diagnosis not present

## 2015-12-24 DIAGNOSIS — Z954 Presence of other heart-valve replacement: Secondary | ICD-10-CM | POA: Diagnosis not present

## 2015-12-24 DIAGNOSIS — Z7982 Long term (current) use of aspirin: Secondary | ICD-10-CM | POA: Insufficient documentation

## 2015-12-24 DIAGNOSIS — I110 Hypertensive heart disease with heart failure: Secondary | ICD-10-CM | POA: Insufficient documentation

## 2015-12-24 DIAGNOSIS — Z95810 Presence of automatic (implantable) cardiac defibrillator: Secondary | ICD-10-CM | POA: Insufficient documentation

## 2015-12-24 DIAGNOSIS — I5022 Chronic systolic (congestive) heart failure: Secondary | ICD-10-CM | POA: Insufficient documentation

## 2015-12-24 DIAGNOSIS — Z952 Presence of prosthetic heart valve: Secondary | ICD-10-CM | POA: Insufficient documentation

## 2015-12-24 DIAGNOSIS — Z79899 Other long term (current) drug therapy: Secondary | ICD-10-CM | POA: Diagnosis not present

## 2015-12-24 DIAGNOSIS — K219 Gastro-esophageal reflux disease without esophagitis: Secondary | ICD-10-CM | POA: Insufficient documentation

## 2015-12-24 DIAGNOSIS — R4189 Other symptoms and signs involving cognitive functions and awareness: Secondary | ICD-10-CM | POA: Insufficient documentation

## 2015-12-24 DIAGNOSIS — F0390 Unspecified dementia without behavioral disturbance: Secondary | ICD-10-CM | POA: Insufficient documentation

## 2015-12-24 DIAGNOSIS — I428 Other cardiomyopathies: Secondary | ICD-10-CM | POA: Diagnosis not present

## 2015-12-24 HISTORY — DX: Other amnesia: R41.3

## 2015-12-24 MED ORDER — LISINOPRIL 2.5 MG PO TABS: 2.5000 mg | ORAL_TABLET | Freq: Two times a day (BID) | ORAL | Status: DC

## 2015-12-24 MED ORDER — FUROSEMIDE 20 MG PO TABS: 20.0000 mg | ORAL_TABLET | Freq: Every day | ORAL | Status: DC

## 2015-12-24 MED ORDER — SPIRONOLACTONE 25 MG PO TABS: 12.5000 mg | ORAL_TABLET | Freq: Every day | ORAL | Status: DC

## 2015-12-24 NOTE — Patient Instructions (Signed)
START Spironolactone 12.5 mg, (one half tab) daily  Labs needed in one week  Your physician recommends that you schedule a follow-up appointment in: 3 months  Do the following things EVERYDAY: 1) Weigh yourself in the morning before breakfast. Write it down and keep it in a log. 2) Take your medicines as prescribed 3) Eat low salt foods-Limit salt (sodium) to 2000 mg per day.  4) Stay as active as you can everyday 5) Limit all fluids for the day to less than 2 liters 6)

## 2015-12-24 NOTE — Progress Notes (Signed)
Patient ID: [name], male   DOB: 24-May-1946, 70 y.o.   MRN: [id number]     Advanced Heart Failure Clinic Note    Primary Cardiologist: Dr. Gala Romney Internal Medicine: Dr Earnest Conroy EP: Dr Graciela Husbands   HPI: [name] is a 70 year old male with h/o polysubstance abuse c/b MV endocarditis s/p St. Jude MVR, CHF due to NICM and PAF.  Underwent CRT-P implant for bradycardia and fatigue.   Follow up for Heart failure: Overall feeling better. Denies SOB/PND/Orthopnea. Has dizziness every now and then. Has ongoing memory issues. He forgets where he has been. Cant remember who the president is. He does not drive. Does admit to fatigue.  No weighing at home. Taking all medications and brought medications to the appointment. Appetite ok.  No BRBPR.    Labs (1/15): K 3.9, creatinine 1.56, hemoglobin 11 Labs (01/14/2015) K 3.2 Creatinine 1.55 BNP 412 Labs (01/30/15): k 3.8 Creatinine 1.47  Labs (8/16): K 4, creatinine 1.55, digoxin 0.6 Labs (9/16) K 3.4, creatinine 1.48 Labs (09/24/2015) K 3.7 Creatinine 1.24   TTE in 2010 showed normal EF with elevated gradient across MV (mean 14) with perivalvular leak. Follow-up TEE in June 2010 with EF 40-45% mild stenosis mean gradient = 8. No vegetation or abscess.   Echo 8/12 EF 35% mild perivalvular MR.  Echo 3/13 EF 50% mild MVR stable with mild peri-valvular leak.  ECHO 12/16/2012 EF 50% stable MVR ECHO 12/2014 - EF 25-30%  Grade I DD, mechanical mitral valve with mean gradient 6 mmHg, no significant MR.  ECHO 09/2015- EF 35-40%. Grade I DD Mitral Valve ok no stenosis  SH: Lives with his wife. No ETOH, tobacco abuse or drugs.  FH: Mom deceased: no cardiac issues        Father deceased: no cardiac issues.   ROS: All systems negative except as listed in HPI, PMH and Problem List.  Past Medical History  Diagnosis Date  . 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  a- Echo 07/2008 Ef 55% mild peroprosthetic MVR with High  transmitral gradient (mean 14).  b- normal coronaries by cath 12/2007  . Atrial fibrillation (HCC)     post op.  s/p dc-cv 04/2008. previously on amiodarone  . Atrial flutter (HCC)     atypical  . Heavy alcohol use     history  . IV drug abuse     history of  . CHF (congestive heart failure) (HCC)     EF 35-40 % 2011 due to valvular disease and diastolic dysfunction  . History of GI bleed   . Hypertension   . GERD (gastroesophageal reflux disease)   . Chronic back pain   . AV block, 1st degree     .450 msec--progressive  . Dysphagia     with normal barium swallow 09/2008  . Pacemaker 2010    Current Outpatient Prescriptions  Medication Sig Dispense Refill  . carvedilol (COREG) 6.25 MG tablet TAKE 1 TABLET (6.25 MG TOTAL) BY MOUTH 2 (TWO) TIMES DAILY WITH A MEAL. 60 tablet 6  . digoxin (LANOXIN) 0.125 MG tablet Take 1 tablet (0.125 mg total) by mouth daily. 90 tablet 3  . lisinopril (PRINIVIL,ZESTRIL) 2.5 MG tablet TAKE 1 TABLET (2.5 MG TOTAL) BY MOUTH 2 (TWO) TIMES DAILY. 60 tablet 3  . warfarin (COUMADIN) 5 MG tablet Take 5-7.5 mg by mouth daily. Take 7.5mg  daily except for 5mg  on Sunday    . finasteride (PROSCAR) 5 MG tablet  Take 1 tablet (5 mg total) by mouth daily. (Patient not taking: Reported on 12/24/2015) 90 tablet 3  . furosemide (LASIX) 20 MG tablet Take 1 tablet (20 mg total) by mouth daily. (Patient not taking: Reported on 12/24/2015) 30 tablet 2   No current facility-administered medications for this encounter.    Filed Vitals:   12/24/15 1116  BP: 120/62  Pulse: 80  Weight: 172 lb (78.019 kg)  SpO2: 100%    PHYSICAL EXAM: Gen: Elderly appearing. Walked into clinic with difficulty. Wife present .  HEENT: normal Neck: supple. no JVD. Carotids 2+ bilat; no bruits. No  Cor:  RRR. No rubs, gallops. mechanical s1. No murmur appreciated Lungs: CTAB, normal effort Abdomen: soft, NT, ND, no HSM. No bruits or masses. +BS  Extremities: no cyanosis, clubbing, rash. No  edema.   Neuro: alert & orientedx3, cranial nerves grossly intact. moves all 4 extremities w/o difficulty. affect pleasant  ASSESSMENT & PLAN: 1. Chronic systolic HF: Nonischemic cardiomyopathy s/p St Jude  CRT-D.  March 2017 EF improved 35-40%.  NYHA class II symptoms. Volume status stable. Continue Lasix 20 mg daily.  - Continue Coreg 6.25 mg bid.  - Continue digoxin 0.125 mg daily  - Continue lisinopril 2.5 mg BID. Would not change to entresto with soft BP and intermittent diizziness.  - Add 12.5 mg spiro today. Check BMET and dig level in 1 week.   2. Mechanical mitral valve: Valve ok on ECHO. On coumadin + aspirin 81 mg daily.   Goal INR 2.5-3.5.   INR followed at the coumadin clinic. 3. GI Bleed: 03/2014,  - no BRBPR/melena. Continue PPI.  4. HTN - Stable.  5. Memory Impairment- Needs PCP to work up. His wife will call for an appointment.   Follow next week for BMET and dig level next week. Follow up in 3months.  Linton Rump Nasser Ku NP-C  12/24/2015

## 2015-12-24 NOTE — Progress Notes (Signed)
Advanced Heart Failure Medication Review by a Pharmacist  Does the patient  feel that his/her medications are working for him/her?  yes  Has the patient been experiencing any side effects to the medications prescribed?  no  Does the patient measure his/her own blood pressure or blood glucose at home?  no   Does the patient have any problems obtaining medications due to transportation or finances?   no  Understanding of regimen: fair Understanding of indications: fair Potential of compliance: fair Patient understands to avoid NSAIDs. Patient understands to avoid decongestants.  Pharmacist comments: [name] is a 60 yom presenting to clinic for a follow-up appointment. He denies any medication-related side effects. He does endorse dizziness when standing up too quickly and SOB that is new. He has recently run out of his lisinopril and lasix (about 2 days ago) and states that he needs new refill. Provider aware. He did not have any additional medication-related questions or concerns at this time.   [name], PharmD Pharmacy Resident  Pager: [phone] 12/24/2015 11:38 AM    Time with patient: 10 min  Preparation and documentation time: 2 min  Total time: 12 min

## 2015-12-25 ENCOUNTER — Other Ambulatory Visit: Payer: Self-pay | Admitting: Internal Medicine

## 2015-12-25 ENCOUNTER — Telehealth (HOSPITAL_COMMUNITY): Payer: Self-pay

## 2015-12-25 NOTE — Telephone Encounter (Signed)
Wife called CHF clinic to request coumadin refill. Advised we do not manage INR or coumadin for our patients (only LVADs), and advised to call internal medicine as it seems their last note mentioned INR therapeutic. Aware and agreeable.  Ave Filter

## 2015-12-25 NOTE — Telephone Encounter (Signed)
warfarin (COUMADIN) 5 MG tablet  [facility]

## 2015-12-26 MED ORDER — WARFARIN SODIUM 5 MG PO TABS: 10.0000 mg | ORAL_TABLET | Freq: Every day | ORAL | Status: DC

## 2015-12-30 NOTE — Telephone Encounter (Signed)
Please schedule patient for appoint with anti-coag clinic. Thanks!

## 2016-01-03 ENCOUNTER — Ambulatory Visit (HOSPITAL_COMMUNITY)
Admission: RE | Admit: 2016-01-03 | Discharge: 2016-01-03 | Disposition: A | Discharge status: Home / Self Care | Payer: Medicare Other | Source: Ambulatory Visit | Attending: Cardiology | Admitting: Cardiology

## 2016-01-03 ENCOUNTER — Other Ambulatory Visit (HOSPITAL_COMMUNITY): Payer: Self-pay | Admitting: *Deleted

## 2016-01-03 ENCOUNTER — Telehealth (HOSPITAL_COMMUNITY): Payer: Self-pay | Admitting: *Deleted

## 2016-01-03 DIAGNOSIS — I5022 Chronic systolic (congestive) heart failure: Secondary | ICD-10-CM | POA: Diagnosis not present

## 2016-01-03 LAB — BASIC METABOLIC PANEL
Anion gap: 9 (ref 5–15)
BUN: 13 mg/dL (ref 6–20)
CALCIUM: 9.4 mg/dL (ref 8.9–10.3)
CO2: 22 mmol/L (ref 22–32)
CREATININE: 1.48 mg/dL — AB (ref 0.61–1.24)
Chloride: 107 mmol/L (ref 101–111)
GFR calc non Af Amer: 46 mL/min — ABNORMAL LOW (ref >60)
GFR, EST AFRICAN AMERICAN: 53 mL/min — AB (ref >60)
Glucose, Bld: 99 mg/dL (ref 65–99)
Potassium: 3.4 mmol/L — ABNORMAL LOW (ref 3.5–5.1)
SODIUM: 138 mmol/L (ref 135–145)

## 2016-01-03 LAB — DIGOXIN LEVEL: DIGOXIN LVL: 0.5 ng/mL — AB (ref 0.8–2.0)

## 2016-01-03 MED ORDER — POTASSIUM CHLORIDE ER 20 MEQ PO TBCR: 20.0000 meq | EXTENDED_RELEASE_TABLET | Freq: Every day | ORAL | Status: DC

## 2016-01-03 NOTE — Telephone Encounter (Signed)
Pt aware of lab results and medication changes.   Notes Recorded by Sherald Hess, NP on 01/03/2016 at 2:08 PM Please call. Add 20 meq potassium daily.

## 2016-01-04 DIAGNOSIS — K922 Gastrointestinal hemorrhage, unspecified: Secondary | ICD-10-CM

## 2016-01-04 HISTORY — DX: Gastrointestinal hemorrhage, unspecified: K92.2

## 2016-01-06 ENCOUNTER — Ambulatory Visit (INDEPENDENT_AMBULATORY_CARE_PROVIDER_SITE_OTHER): Payer: Medicare Other | Admitting: Pharmacist

## 2016-01-06 DIAGNOSIS — Z954 Presence of other heart-valve replacement: Secondary | ICD-10-CM

## 2016-01-06 DIAGNOSIS — Z7901 Long term (current) use of anticoagulants: Secondary | ICD-10-CM | POA: Diagnosis not present

## 2016-01-06 DIAGNOSIS — Z952 Presence of prosthetic heart valve: Secondary | ICD-10-CM

## 2016-01-06 LAB — POCT INR: INR: 3.2

## 2016-01-06 NOTE — Patient Instructions (Signed)
Patient instructed to take medications as defined in the Anti-coagulation Track section of this encounter.  Patient instructed to take today's dose.  Patient verbalized understanding of these instructions.    

## 2016-01-06 NOTE — Progress Notes (Signed)
Anti-Coagulation Progress Note  [name] is a 70 y.o. male who is currently on an anti-coagulation regimen.    RECENT RESULTS: Recent results are below, the most recent result is correlated with a dose of 70 mg. per week: Lab Results  Component Value Date   INR 3.20 01/06/2016   INR 2.50 11/25/2015   INR 2.0 11/18/2015    ANTI-COAG DOSE: Anticoagulation Dose Instructions as of 01/06/2016      Glynis Smiles Tue Wed Thu Fri Sat   New Dose 10 mg 10 mg 10 mg 10 mg 10 mg 10 mg 10 mg       ANTICOAG SUMMARY: Anticoagulation Episode Summary    Current INR goal 2.5-3.5  Next INR check 01/20/2016  INR from last check 3.20 (01/06/2016)  Weekly max dose   Target end date Indefinite  [facility]  Preferred lab   Send INR reminders to [facility] [address]   Indications  ATRIAL FLUTTER (Resolved) [I48.92] IRREGULAR PULSE [I49.9] Long term (current) use of anticoagulants (Resolved) [Z79.01]        Comments       Anticoagulation Care Providers    Provider Role Specialty Phone number   [name], MD  Cardiology [phone]      ANTICOAG TODAY: Anticoagulation Summary as of 01/06/2016    INR goal 2.5-3.5  Selected INR 3.20 (01/06/2016)  Next INR check 01/20/2016  Target end date Indefinite   Indications  ATRIAL FLUTTER (Resolved) [I48.92] IRREGULAR PULSE [I49.9] Long term (current) use of anticoagulants (Resolved) [Z79.01]      Anticoagulation Episode Summary    [facility]   Preferred lab    Send INR reminders to ANT[facility]AG [facility]   Comments     Anticoagulation Care Providers    Provider Role Specialty Phone number   [name], MD  Cardiology [phone]      PATIENT INSTRUCTIONS: Patient Instructions  Patient instructed to take medications as defined in the Anti-coagulation Track section of this encounter.  Patient instructed to take today's dose.  Patient verbalized understanding of  these instructions.       FOLLOW-UP Return in 2 weeks (on 01/20/2016) for Follow up INR at 1045h.  Hulen Luster, III Pharm.D., CACP

## 2016-01-13 NOTE — Progress Notes (Signed)
I have reviewed Dr. Saralyn Pilar note.  INR at goal based on stated goals.

## 2016-01-13 NOTE — Telephone Encounter (Signed)
Was seen 01/16/2016 in anti-coag clinic

## 2016-01-14 ENCOUNTER — Telehealth (HOSPITAL_COMMUNITY): Payer: Self-pay | Admitting: Cardiology

## 2016-01-14 DIAGNOSIS — I509 Heart failure, unspecified: Secondary | ICD-10-CM

## 2016-01-14 NOTE — Telephone Encounter (Signed)
Pt aware.

## 2016-01-14 NOTE — Telephone Encounter (Signed)
-----   Message from Sherald Hess, NP sent at 01/14/2016  2:57 PM EDT ----- Need to repeat BMET . Please call

## 2016-01-20 ENCOUNTER — Ambulatory Visit (HOSPITAL_COMMUNITY)
Admission: RE | Admit: 2016-01-20 | Discharge: 2016-01-20 | Disposition: A | Discharge status: Home / Self Care | Payer: Medicare Other | Source: Ambulatory Visit | Attending: Cardiology | Admitting: Cardiology

## 2016-01-20 DIAGNOSIS — I509 Heart failure, unspecified: Secondary | ICD-10-CM | POA: Diagnosis not present

## 2016-01-20 LAB — BASIC METABOLIC PANEL
Anion gap: 6 (ref 5–15)
BUN: 13 mg/dL (ref 6–20)
CHLORIDE: 106 mmol/L (ref 101–111)
CO2: 24 mmol/L (ref 22–32)
CREATININE: 1.36 mg/dL — AB (ref 0.61–1.24)
Calcium: 9.2 mg/dL (ref 8.9–10.3)
GFR, EST AFRICAN AMERICAN: 58 mL/min — AB (ref >60)
GFR, EST NON AFRICAN AMERICAN: 50 mL/min — AB (ref >60)
Glucose, Bld: 116 mg/dL — ABNORMAL HIGH (ref 65–99)
Potassium: 4 mmol/L (ref 3.5–5.1)
SODIUM: 136 mmol/L (ref 135–145)

## 2016-01-29 ENCOUNTER — Encounter (HOSPITAL_COMMUNITY): Payer: Self-pay

## 2016-01-29 ENCOUNTER — Inpatient Hospital Stay (HOSPITAL_COMMUNITY)
Admission: EM | Admit: 2016-01-29 | Discharge: 2016-02-01 | DRG: 812 | Disposition: A | Discharge status: Home / Self Care | Payer: Medicare Other | Attending: Internal Medicine | Admitting: Internal Medicine

## 2016-01-29 DIAGNOSIS — I5022 Chronic systolic (congestive) heart failure: Secondary | ICD-10-CM | POA: Diagnosis not present

## 2016-01-29 DIAGNOSIS — S5011XA Contusion of right forearm, initial encounter: Secondary | ICD-10-CM | POA: Diagnosis not present

## 2016-01-29 DIAGNOSIS — D649 Anemia, unspecified: Secondary | ICD-10-CM | POA: Diagnosis present

## 2016-01-29 DIAGNOSIS — Z79899 Other long term (current) drug therapy: Secondary | ICD-10-CM

## 2016-01-29 DIAGNOSIS — K219 Gastro-esophageal reflux disease without esophagitis: Secondary | ICD-10-CM | POA: Diagnosis present

## 2016-01-29 DIAGNOSIS — R3129 Other microscopic hematuria: Secondary | ICD-10-CM | POA: Diagnosis present

## 2016-01-29 DIAGNOSIS — I428 Other cardiomyopathies: Secondary | ICD-10-CM | POA: Diagnosis present

## 2016-01-29 DIAGNOSIS — I110 Hypertensive heart disease with heart failure: Secondary | ICD-10-CM | POA: Diagnosis present

## 2016-01-29 DIAGNOSIS — Y847 Blood-sampling as the cause of abnormal reaction of the patient, or of later complication, without mention of misadventure at the time of the procedure: Secondary | ICD-10-CM | POA: Diagnosis present

## 2016-01-29 DIAGNOSIS — N400 Enlarged prostate without lower urinary tract symptoms: Secondary | ICD-10-CM | POA: Diagnosis present

## 2016-01-29 DIAGNOSIS — Z952 Presence of prosthetic heart valve: Secondary | ICD-10-CM

## 2016-01-29 DIAGNOSIS — I4891 Unspecified atrial fibrillation: Secondary | ICD-10-CM | POA: Diagnosis present

## 2016-01-29 DIAGNOSIS — R51 Headache: Secondary | ICD-10-CM | POA: Diagnosis present

## 2016-01-29 DIAGNOSIS — T45515A Adverse effect of anticoagulants, initial encounter: Secondary | ICD-10-CM | POA: Diagnosis present

## 2016-01-29 DIAGNOSIS — S40029A Contusion of unspecified upper arm, initial encounter: Secondary | ICD-10-CM

## 2016-01-29 DIAGNOSIS — R791 Abnormal coagulation profile: Secondary | ICD-10-CM | POA: Diagnosis not present

## 2016-01-29 DIAGNOSIS — Z950 Presence of cardiac pacemaker: Secondary | ICD-10-CM

## 2016-01-29 DIAGNOSIS — D62 Acute posthemorrhagic anemia: Principal | ICD-10-CM | POA: Diagnosis present

## 2016-01-29 DIAGNOSIS — I4892 Unspecified atrial flutter: Secondary | ICD-10-CM | POA: Diagnosis not present

## 2016-01-29 DIAGNOSIS — Z7901 Long term (current) use of anticoagulants: Secondary | ICD-10-CM

## 2016-01-29 HISTORY — DX: Endocarditis, valve unspecified: I38

## 2016-01-29 HISTORY — DX: Gastrointestinal hemorrhage, unspecified: K92.2

## 2016-01-29 HISTORY — DX: Benign prostatic hyperplasia without lower urinary tract symptoms: N40.0

## 2016-01-29 HISTORY — DX: Anemia, unspecified: D64.9

## 2016-01-29 LAB — CBC
HCT: 30.2 % — ABNORMAL LOW (ref 39.0–52.0)
HEMATOCRIT: 27.9 % — AB (ref 39.0–52.0)
HEMOGLOBIN: 9.4 g/dL — AB (ref 13.0–17.0)
Hemoglobin: 8.6 g/dL — ABNORMAL LOW (ref 13.0–17.0)
MCH: 27.6 pg (ref 26.0–34.0)
MCH: 27.7 pg (ref 26.0–34.0)
MCHC: 30.8 g/dL (ref 30.0–36.0)
MCHC: 31.1 g/dL (ref 30.0–36.0)
MCV: 89.4 fL (ref 78.0–100.0)
MCV: 89.1 fL (ref 78.0–100.0)
PLATELETS: 387 10*3/uL (ref 150–400)
Platelets: 309 10*3/uL (ref 150–400)
RBC: 3.12 MIL/uL — ABNORMAL LOW (ref 4.22–5.81)
RBC: 3.39 MIL/uL — ABNORMAL LOW (ref 4.22–5.81)
RDW: 18.8 % — AB (ref 11.5–15.5)
RDW: 18.2 % — AB (ref 11.5–15.5)
WBC: 7.7 10*3/uL (ref 4.0–10.5)
WBC: 8.3 10*3/uL (ref 4.0–10.5)

## 2016-01-29 LAB — BASIC METABOLIC PANEL
Anion gap: 8 (ref 5–15)
BUN: 18 mg/dL (ref 6–20)
CHLORIDE: 104 mmol/L (ref 101–111)
CO2: 23 mmol/L (ref 22–32)
Calcium: 8.6 mg/dL — ABNORMAL LOW (ref 8.9–10.3)
Creatinine, Ser: 1.31 mg/dL — ABNORMAL HIGH (ref 0.61–1.24)
GFR calc Af Amer: >60 mL/min (ref >60)
GFR calc non Af Amer: 53 mL/min — ABNORMAL LOW (ref >60)
GLUCOSE: 115 mg/dL — AB (ref 65–99)
POTASSIUM: 4.1 mmol/L (ref 3.5–5.1)
Sodium: 135 mmol/L (ref 135–145)

## 2016-01-29 LAB — HEPATIC FUNCTION PANEL
ALT: 26 U/L (ref 17–63)
AST: 69 U/L — AB (ref 15–41)
Albumin: 4 g/dL (ref 3.5–5.0)
Alkaline Phosphatase: 46 U/L (ref 38–126)
BILIRUBIN DIRECT: 0.3 mg/dL (ref 0.1–0.5)
BILIRUBIN INDIRECT: 1.1 mg/dL — AB (ref 0.3–0.9)
BILIRUBIN TOTAL: 1.4 mg/dL — AB (ref 0.3–1.2)
Total Protein: 7 g/dL (ref 6.5–8.1)

## 2016-01-29 LAB — URINALYSIS, ROUTINE W REFLEX MICROSCOPIC
Bilirubin Urine: NEGATIVE
GLUCOSE, UA: NEGATIVE mg/dL
Ketones, ur: NEGATIVE mg/dL
LEUKOCYTES UA: NEGATIVE
Nitrite: NEGATIVE
PROTEIN: NEGATIVE mg/dL
Specific Gravity, Urine: 1.012 (ref 1.005–1.030)
pH: 7.5 (ref 5.0–8.0)

## 2016-01-29 LAB — URINE MICROSCOPIC-ADD ON: Squamous Epithelial / LPF: NONE SEEN

## 2016-01-29 LAB — POC OCCULT BLOOD, ED: FECAL OCCULT BLD: NEGATIVE

## 2016-01-29 LAB — PROTIME-INR
INR: 7.76
INR: 7.44
Prothrombin Time: 68 seconds — ABNORMAL HIGH (ref 11.4–15.2)
Prothrombin Time: 65.7 seconds — ABNORMAL HIGH (ref 11.4–15.2)

## 2016-01-29 MED ORDER — DIGOXIN 125 MCG PO TABS
0.1250 mg | ORAL_TABLET | Freq: Every day | ORAL | Status: DC
Start: 2016-01-30 — End: 2016-02-01
  Administered 2016-01-30 – 2016-02-01 (×3): 0.125 mg via ORAL
  Filled 2016-01-29 (×3): qty 1

## 2016-01-29 MED ORDER — ACETAMINOPHEN 325 MG PO TABS
650.0000 mg | ORAL_TABLET | Freq: Four times a day (QID) | ORAL | Status: DC | PRN
  Administered 2016-01-29 – 2016-01-30 (×3): 650 mg via ORAL
  Filled 2016-01-29 (×4): qty 2

## 2016-01-29 MED ORDER — FINASTERIDE 5 MG PO TABS
5.0000 mg | ORAL_TABLET | Freq: Every day | ORAL | Status: DC
  Administered 2016-01-30 – 2016-02-01 (×3): 5 mg via ORAL
  Filled 2016-01-29 (×3): qty 1

## 2016-01-29 MED ORDER — CARVEDILOL 6.25 MG PO TABS
6.2500 mg | ORAL_TABLET | Freq: Two times a day (BID) | ORAL | Status: DC
  Administered 2016-01-29 – 2016-02-01 (×6): 6.25 mg via ORAL
  Filled 2016-01-29 (×6): qty 1

## 2016-01-29 MED ORDER — SPIRONOLACTONE 25 MG PO TABS
12.5000 mg | ORAL_TABLET | Freq: Every day | ORAL | Status: DC
Start: 2016-01-30 — End: 2016-02-01
  Administered 2016-01-30 – 2016-02-01 (×3): 12.5 mg via ORAL
  Filled 2016-01-29 (×3): qty 1

## 2016-01-29 MED ORDER — ACETAMINOPHEN 650 MG RE SUPP: 650.0000 mg | Freq: Four times a day (QID) | RECTAL | Status: DC | PRN

## 2016-01-29 MED ORDER — LISINOPRIL 5 MG PO TABS
2.5000 mg | ORAL_TABLET | Freq: Two times a day (BID) | ORAL | Status: DC
  Administered 2016-01-30 – 2016-02-01 (×4): 2.5 mg via ORAL
  Filled 2016-01-29 (×6): qty 1

## 2016-01-29 MED ORDER — ENOXAPARIN SODIUM 40 MG/0.4ML ~~LOC~~ SOLN: 40.0000 mg | SUBCUTANEOUS | Status: DC

## 2016-01-29 MED ORDER — SENNOSIDES-DOCUSATE SODIUM 8.6-50 MG PO TABS: 1.0000 | ORAL_TABLET | Freq: Every evening | ORAL | Status: DC | PRN

## 2016-01-29 MED ORDER — OXYCODONE HCL 5 MG PO TABS
5.0000 mg | ORAL_TABLET | Freq: Four times a day (QID) | ORAL | Status: DC | PRN
  Administered 2016-01-29 – 2016-01-30 (×2): 5 mg via ORAL
  Filled 2016-01-29 (×3): qty 1

## 2016-01-29 NOTE — H&P (Signed)
Date: 01/29/2016               Patient Name:  [name] Hubbard MRN: [id number]  DOB: 09-23-1945 Age / Sex: 70 y.o., male   PCP: [name], DO         Medical Service: Internal Medicine Teaching Service         Attending Physician: Dr. Earl Lagos, MD    First Contact: Dr. Reymundo Poll Pager: [phone]  Second Contact: Dr. 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 About one week ago, he began to notice areas of bruising on his arms and legs after gently brushing objects at home. He also complains of new muscle pains in his bilateral thighs and pain in his L ankle that occasionally swells. He endorses increasing fatigue over this time period but denies feeling dizzy or lightheaded. He reports chronic headaches for which he takes 1-2mg  of tylenol daily. He denies chest pain, heart palpitations, SOB, and any recent changes in his diet. He has been taking all of his medications as prescribed except for his lasix, which he did not have with him and has not been taking.  In the ED he was afebrile, BP 112/65, HR 89, RR 16, and sating 100% on RA. INR was supra therapeutic at 7.76 and Hgb was was 8.6 (basline 12). FOB was negative. UA showed moderate Hgb on dipstick and few bacteria. BUN 18 and Cr 1.31 at baseline. CXR with no acute cardiopulmonary disease.   Meds:  Prescriptions Prior to Admission  Medication Sig Dispense Refill Last Dose  . acetaminophen (TYLENOL) 500 MG tablet Take 1,000 mg by mouth every 6 (six) hours as needed for mild pain.   01/29/2016 at Unknown time  . carvedilol (COREG)  6.25 MG tablet TAKE 1 TABLET (6.25 MG TOTAL) BY MOUTH 2 (TWO) TIMES DAILY WITH A MEAL. 60 tablet 6 01/29/2016 at 0800  . digoxin (LANOXIN) 0.125 MG tablet TAKE 1 TABLET (0.125 MG TOTAL) BY MOUTH DAILY. 90 tablet 3 01/29/2016 at Unknown time  . finasteride (PROSCAR) 5 MG tablet Take 1 tablet (5 mg total) by mouth daily. 90 tablet 3 01/29/2016 at Unknown time  . lisinopril (PRINIVIL,ZESTRIL) 2.5 MG tablet Take 1 tablet (2.5 mg total) by mouth 2 (two) times daily. 60 tablet 6 01/29/2016 at Unknown time  . potassium chloride 20 MEQ TBCR Take 20 mEq by mouth daily. 30 tablet 3 01/29/2016 at Unknown time  . spironolactone (ALDACTONE) 25 MG tablet Take 0.5 tablets (12.5 mg total) by mouth daily. 15 tablet 3 01/29/2016 at Unknown time  . warfarin (COUMADIN) 5 MG tablet Take 2 tablets (10 mg total) by mouth daily. 60 tablet 2 01/29/2016 at 0800  . furosemide (LASIX) 20 MG tablet Take 1 tablet (20 mg total) by mouth daily. (Patient not taking: Reported on 01/29/2016) 30 tablet 6 Not Taking at Unknown time     Allergies: Allergies as of 01/29/2016  . (No Known Allergies)   Past Medical History:  Diagnosis Date  . Atrial fibrillation (HCC)    post op.  s/p dc-cv 04/2008. previously  on amiodarone  . Atrial flutter (HCC)    atypical  . AV block, 1st degree    .450 msec--progressive  . 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  a- Echo 07/2008 Ef 55% mild peroprosthetic MVR with High transmitral gradient (mean 14).  b- normal coronaries by cath 12/2007  . CHF (congestive heart failure) (HCC)    EF 35-40 % 2011 due to valvular disease and diastolic dysfunction  . Chronic back pain   . Dysphagia    with normal barium swallow 09/2008  . GERD (gastroesophageal reflux disease)   . Heavy alcohol use    history  . History of GI bleed   . Hypertension   . IV drug abuse    history of  . Pacemaker 2010    Family History: Denies family history   Social History: Lives at home  with his girlfriend. Former polysubstance use, now sober for 5-6 years. Denies current or former tobacco use.   Review of Systems: A complete ROS was negative except as per HPI.   Physical Exam: Blood pressure 112/65, pulse 94, temperature 98.4 F (36.9 C), temperature source Oral, resp. rate 16, height 5\' 8"  (1.727 m), weight 178 lb (80.7 kg), SpO2 100 %.  Constitutional: Sitting up in bed, NAD. HEENT: Atraumatic, normocephalic, anicteric sclera.  Neck: Supple, trachea midline.  Cardiovascular: RRR, no murmurs rubs or gallops, mechanical MV Pulmonary/Chest: CTAB. No wheezes rales, or rhonchi. Anterior chest wall with 4-5cm ecchymosis. Abdominal: Soft, non tender, non distended. +BS. Extremities: warm and well perfused, 2+ distal pulses. No edema.  Neurological: A&Ox3, CN II - XII grossly intact.  Skin: Multiple ecchymosis of varying sizes and color on bilateral upper and lower extremities, anterior chest wall, and L flank.  Psychiatric: Normal mood and affect.  CXR: IMPRESSION: 1. Low lung volumes without radiographic evidence of acute cardiopulmonary disease. 2. Postoperative changes and support apparatus, as above.   Assessment & Plan by Problem: [name] Hubbard is a 70 yo M with a pmhx of MV endocarditis 2/2 to polysubstance use now s/p MVR with mechanical valve on chronic warfarin who presents with complaints of easy bruising and found to have a supra therapeutic INR of 7.76.  Supra therapeutic INR: On warfarin for mechanical value replacement (goal 2.5 - 3.5). Dose was recently increased from 5 mg to 10 mg on 11/13/15. Last checked on 01/06/16 at which point INR was 3.2. Elevated today at 7.76. Patient found to have multiple ecchymosis but no active source of bleeding. Hgb 8.6. Patient last took his warfarin dose this morning.  -- Hold warfarin  -- Recheck INR at 18:00; if INR >9 will plan to give po Vit K 2.5mg   -- f/u daily INR -- Continue to monitor for any signs of active bleeding    Normocytic Anemia: Currently asymptomatic. Baseline hgb of 12. CBC on admission with hgb 8.6. Likely 2/2 to multiple ecchymosis in the setting of supra therapeutic INR.  -- Repeat CBC at 18:00  -- Monitor for now  Microscopic Hematuria: Positive HGB dipstick on UA, few bacteria but no other signs or symptoms of infection. Possibly 2/2 to mucosal bleed in the setting of supra therapeutic INR.  -- Recheck UA after INR is back to therapeutic range -- If still present, consider outpatient work up for malignancy including CT abdomen pelvis   HFrEF: Last EF 35-40% (10/02/15). Pacemaker in place.  -- Continue home Coreg 6.25mg  BID -- Continue home Lisinopril 2.5mg  BID --  Continue home Spironolactone 12.5 mg daily -- Continue home Digoxin 0.125 mg daily   BPH:  -- Continue home finasteride 5 mg daily   Chronic Headaches:  -- Continue tylenol prn  VTE ppx: holding warfarin  FEN: Heart healthy diet Code status: FULL   Dispo: Admit patient to Inpatient with expected length of stay greater than 2 midnights.  Signed: Reymundo Poll, MD 01/29/2016, 3:43 PM  Pager: [phone]

## 2016-01-29 NOTE — Progress Notes (Signed)
Patient has developed a swollen mass in his right forearm related to lab draw without applying pressure post removal. Held pressure to stop the bleeding, effective. This RN had the patient apply ice to the area with little improvement. Will continue to monitor.

## 2016-01-29 NOTE — ED Notes (Signed)
Care handoff to [facility] on [place]

## 2016-01-29 NOTE — ED Provider Notes (Signed)
[facility]-EMERGENCY DEPT Provider Note   CSN: [id number] Arrival date & time: 01/29/16  9833  First Provider Contact:  First MD Initiated Contact with Patient 01/29/16 1103        History   Chief Complaint Chief Complaint  Patient presents with  . Bleeding/Bruising    HPI  The history is provided by the patient.  [name] is a 70 y.o. male hx of afib, mitral valve replacement with mechanical valve on coumadin, here with easy bleeding and bruising. Patient is on coumadin for afib and mechanical valve. Last INR was 3.2 on 7/3. He has been bleeding and bruising easily for the last few weeks. Noticed multiple skin ecchymosis when he touches something. Also has worsening bilateral leg pain. Denies blood in stool or black stools. Denies chest pain or abdominal pain or fevers.    Past Medical History:  Diagnosis Date  . Atrial fibrillation (HCC)    post op.  s/p dc-cv 04/2008. previously on amiodarone  . Atrial flutter (HCC)    atypical  . AV block, 1st degree    .450 msec--progressive  . 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  a- Echo 07/2008 Ef 55% mild peroprosthetic MVR with High transmitral gradient (mean 14).  b- normal coronaries by cath 12/2007  . CHF (congestive heart failure) (HCC)    EF 35-40 % 2011 due to valvular disease and diastolic dysfunction  . Chronic back pain   . Dysphagia    with normal barium swallow 09/2008  . GERD (gastroesophageal reflux disease)   . Heavy alcohol use    history  . History of GI bleed   . Hypertension   . IV drug abuse    history of  . Pacemaker 2010    Patient Active Problem List   Diagnosis Date Noted  . Memory impairment 12/24/2015  . H/O mitral valve replacement with mechanical valve 11/13/2015  . Benign prostatic hypertrophy 11/13/2015  . Chronic anticoagulation 11/13/2015  . Healthcare maintenance 11/13/2015  . GI AVM (gastrointestinal arteriovenous vascular malformation) 03/28/2014    . Lower GI bleed 03/23/2014  . Chronic systolic heart failure (HCC) 06/02/2011  . ENDOCARDITIS 03/13/2008  . IRREGULAR PULSE 03/13/2008    Past Surgical History:  Procedure Laterality Date  . CARDIAC VALVE REPLACEMENT     mitral valve, st jude mechanical   . COLONOSCOPY N/A 03/26/2014   Procedure: COLONOSCOPY;  Surgeon: Rachael Fee, MD;  Location: [facility] ENDOSCOPY;  Service: Endoscopy;  Laterality: N/A;  . ENTEROSCOPY N/A 03/28/2014   Procedure: ENTEROSCOPY;  Surgeon: Rachael Fee, MD;  Location: [facility] ENDOSCOPY;  Service: Endoscopy;  Laterality: N/A;  . ESOPHAGOGASTRODUODENOSCOPY N/A 03/25/2014   Procedure: ESOPHAGOGASTRODUODENOSCOPY (EGD);  Surgeon: Iva Boop, MD;  Location: [facility] ENDOSCOPY;  Service: Endoscopy;  Laterality: N/A;  . GIVENS CAPSULE STUDY N/A 03/26/2014   Procedure: GIVENS CAPSULE STUDY;  Surgeon: Rachael Fee, MD;  Location: [facility] ENDOSCOPY;  Service: Endoscopy;  Laterality: N/A;  . PACEMAKER INSERTION     inserted about 4-5 years ago       Home Medications    Prior to Admission medications   Medication Sig Start Date End Date Taking? Authorizing Provider  carvedilol (COREG) 6.25 MG tablet TAKE 1 TABLET (6.25 MG TOTAL) BY MOUTH 2 (TWO) TIMES DAILY WITH A MEAL. 04/12/15   [name], MD  digoxin (LANOXIN) 0.125 MG tablet TAKE 1 TABLET (0.125 MG TOTAL) BY MOUTH DAILY. 12/25/15   Amy D  Clegg, NP  finasteride (PROSCAR) 5 MG tablet Take 1 tablet (5 mg total) by mouth daily. 11/13/15 11/12/16  Selina Cooley, MD  furosemide (LASIX) 20 MG tablet Take 1 tablet (20 mg total) by mouth daily. 12/24/15   Amy D Clegg, NP  lisinopril (PRINIVIL,ZESTRIL) 2.5 MG tablet Take 1 tablet (2.5 mg total) by mouth 2 (two) times daily. 12/24/15   Amy D Clegg, NP  potassium chloride 20 MEQ TBCR Take 20 mEq by mouth daily. 01/03/16   Amy D Filbert Schilder, NP  spironolactone (ALDACTONE) 25 MG tablet Take 0.5 tablets (12.5 mg total) by mouth daily. 12/24/15   Amy D Filbert Schilder, NP  warfarin (COUMADIN) 5 MG  tablet Take 2 tablets (10 mg total) by mouth daily. 12/26/15   [name], DO    Family History Family History  Problem Relation Age of Onset  . Coronary artery disease Mother     Social History Social History  Substance Use Topics  . Smoking status: Former Smoker    Years: 0.00    Types: Cigarettes  . Smokeless tobacco: Never Used  . Alcohol use No     Allergies   Review of patient's allergies indicates no known allergies.   Review of Systems Review of Systems  Hematological: Bruises/bleeds easily.  All other systems reviewed and are negative.    Physical Exam Updated Vital Signs BP 129/77 (BP Location: Right Arm)   Pulse 89   Temp 98.4 F (36.9 C) (Oral)   Resp 16   Ht  (1.727 m)   Wt 178 lb (80.7 kg)   SpO2 100%   BMI 27.06 kg/m   Physical Exam  Constitutional: He is oriented to person, place, and time.  Chronically ill   HENT:  Head: Normocephalic and atraumatic.  Eyes: EOM are normal. Pupils are equal, round, and reactive to light.  Neck: Normal range of motion.  Cardiovascular: Normal rate and regular rhythm.   Pulmonary/Chest: Effort normal and breath sounds normal.  Abdominal: Soft. Bowel sounds are normal.  Genitourinary:  Genitourinary Comments: Rectal- no obvious blood in stool   Musculoskeletal: Normal range of motion.  Neurological: He is alert and oriented to person, place, and time.  Skin: Skin is warm.  + ecchymosis on L elbow, bruising on arm   Psychiatric: He has a normal mood and affect.  Nursing note and vitals reviewed.    ED Treatments / Results  Labs (all labs ordered are listed, but only abnormal results are displayed) Labs Reviewed  BASIC METABOLIC PANEL - Abnormal; Notable for the following:       Result Value   Glucose, Bld 115 (*)    Creatinine, Ser 1.31 (*)    Calcium 8.6 (*)    GFR calc non Af Amer 53 (*)    All other components within normal limits  CBC - Abnormal; Notable for the following:    RBC  3.12 (*)    Hemoglobin 8.6 (*)    HCT 27.9 (*)    RDW 18.8 (*)    All other components within normal limits  PROTIME-INR - Abnormal; Notable for the following:    Prothrombin Time 68.0 (*)    INR 7.76 (*)    All other components within normal limits  POC OCCULT BLOOD, ED    EKG  EKG Interpretation None       Radiology No results found.  Procedures Procedures (including critical care time)  Medications Ordered in ED Medications - No data to display   Initial  Impression / Assessment and Plan / ED Course  I have reviewed the triage vital signs and the nursing notes.  Pertinent labs & imaging results that were available during my care of the patient were reviewed by me and considered in my medical decision making (see chart for details).  Clinical Course   [name] is a 70 y.o. male here with easy bruising and bleeding. Will get guiac. Will check cbc, INR.   12:24 PM INR 7.7. Occ neg. Hg dropped to 8.6, baseline around 12. Given that he has not active bleeding and hx of mechanical valve, I held FFP and vit K at this time. Will admit for observation and workup for anemia    Final Clinical Impressions(s) / ED Diagnoses   Final diagnoses:  None    New Prescriptions New Prescriptions   No medications on file     Charlynne Pander, MD 01/29/16 1227

## 2016-01-29 NOTE — ED Triage Notes (Signed)
Patient here with 2 weeks of bruising and soreness x 2 weeks. States that he thinks he is taking too much coumadin, NAD, no headache. Alert and oriented

## 2016-01-29 NOTE — Progress Notes (Signed)
CRITICAL VALUE ALERT  Critical value received:  INR 7.44  Date of notification:  01/29/16  Critical value read back: Yes  Nurse who received alert:  M. Janee Morn  MD notified (1st page):  IM Coverage  Comments: MD stated will continue to monitor for active bleeding. No interventions at this time.

## 2016-01-30 DIAGNOSIS — R3129 Other microscopic hematuria: Secondary | ICD-10-CM | POA: Diagnosis not present

## 2016-01-30 DIAGNOSIS — M7989 Other specified soft tissue disorders: Secondary | ICD-10-CM | POA: Diagnosis not present

## 2016-01-30 DIAGNOSIS — M7981 Nontraumatic hematoma of soft tissue: Secondary | ICD-10-CM | POA: Diagnosis not present

## 2016-01-30 DIAGNOSIS — D649 Anemia, unspecified: Secondary | ICD-10-CM | POA: Diagnosis not present

## 2016-01-30 DIAGNOSIS — N400 Enlarged prostate without lower urinary tract symptoms: Secondary | ICD-10-CM

## 2016-01-30 DIAGNOSIS — Z7901 Long term (current) use of anticoagulants: Secondary | ICD-10-CM

## 2016-01-30 DIAGNOSIS — Z7952 Long term (current) use of systemic steroids: Secondary | ICD-10-CM

## 2016-01-30 DIAGNOSIS — R51 Headache: Secondary | ICD-10-CM

## 2016-01-30 DIAGNOSIS — I5022 Chronic systolic (congestive) heart failure: Secondary | ICD-10-CM

## 2016-01-30 DIAGNOSIS — R791 Abnormal coagulation profile: Secondary | ICD-10-CM | POA: Diagnosis not present

## 2016-01-30 LAB — PROTIME-INR
INR: 7.85 — AB
INR: 8.62
PROTHROMBIN TIME: 68.6 s — AB (ref 11.4–15.2)
Prothrombin Time: 73.9 seconds — ABNORMAL HIGH (ref 11.4–15.2)

## 2016-01-30 LAB — CBC
HCT: 24.6 % — ABNORMAL LOW (ref 39.0–52.0)
HCT: 25.9 % — ABNORMAL LOW (ref 39.0–52.0)
Hemoglobin: 8 g/dL — ABNORMAL LOW (ref 13.0–17.0)
Hemoglobin: 8 g/dL — ABNORMAL LOW (ref 13.0–17.0)
MCH: 28.2 pg (ref 26.0–34.0)
MCH: 27.3 pg (ref 26.0–34.0)
MCHC: 32.5 g/dL (ref 30.0–36.0)
MCHC: 30.9 g/dL (ref 30.0–36.0)
MCV: 86.6 fL (ref 78.0–100.0)
MCV: 88.4 fL (ref 78.0–100.0)
PLATELETS: 373 10*3/uL (ref 150–400)
Platelets: 350 10*3/uL (ref 150–400)
RBC: 2.84 MIL/uL — ABNORMAL LOW (ref 4.22–5.81)
RBC: 2.93 MIL/uL — ABNORMAL LOW (ref 4.22–5.81)
RDW: 18.8 % — AB (ref 11.5–15.5)
RDW: 18.6 % — AB (ref 11.5–15.5)
WBC: 10.5 10*3/uL (ref 4.0–10.5)
WBC: 11.5 10*3/uL — ABNORMAL HIGH (ref 4.0–10.5)

## 2016-01-30 MED ORDER — OXYCODONE HCL 5 MG PO TABS
5.0000 mg | ORAL_TABLET | ORAL | Status: DC | PRN
  Administered 2016-01-30 – 2016-01-31 (×5): 5 mg via ORAL
  Filled 2016-01-30 (×4): qty 1

## 2016-01-30 MED ORDER — SODIUM CHLORIDE 0.9 % IV SOLN
Freq: Once | INTRAVENOUS | Status: AC
  Administered 2016-01-30: 17:00:00 via INTRAVENOUS

## 2016-01-30 MED ORDER — PHYTONADIONE 5 MG PO TABS
2.5000 mg | ORAL_TABLET | Freq: Once | ORAL | Status: AC
  Administered 2016-01-30: 2.5 mg via ORAL
  Filled 2016-01-30 (×2): qty 1

## 2016-01-30 MED ORDER — VITAMIN K1 10 MG/ML IJ SOLN
1.0000 mg | Freq: Once | INTRAMUSCULAR | Status: AC
  Administered 2016-01-30: 1 mg via INTRAVENOUS
  Filled 2016-01-30: qty 0.1

## 2016-01-30 NOTE — Progress Notes (Signed)
Mr [name] reports mild aching pain in RUE, well controlled with oxycodone.  No tingling, weakness, numbness in R hand.  Occasional cramping in L hand.  Some minor bleeding from L hand phlebotomy sites where he says he has scratched the scabs off.  No other bleeding or bruising.  Has been trying to keep RUE elevated.  Last INR 8.62, Got IV VitK today.  Pt in no distress, R arm and forearm grossly swollen ~2x circumference of L, stable from last night.  Radial pulses 3+ symmetric, hands slightly cool symmetric, sensation and strength normal, arms compressible without substantial tenderness to palpation.  No compartment syndrome.  Advised patient to tell nurse if pain worsens or he develops numbness, weakness, parasthesias in arm.  Keep elevated.

## 2016-01-30 NOTE — Progress Notes (Signed)
Paged regarding INR of 7.8. Primary team is giving Vitamin K.   Karlene Lineman, DO PGY-3 Internal Medicine Resident Pager # [phone] 01/30/2016 7:50 AM

## 2016-01-30 NOTE — Progress Notes (Signed)
Subjective: Patient has developed a RUE hematoma after phlebotomy draw overnight. RUE is very swollen and painful, 7/10. He reports his sensation is intact and is able to move all of his digits spontaneously.   Objective:  Vital signs in last 24 hours: Vitals:   01/29/16 1705 01/29/16 2047 01/30/16 0534 01/30/16 0820  BP: 120/68 99/60 104/72 100/64  Pulse: 90 89 (!) 113 (!) 102  Resp: Temp: 98.3 F (36.8 C) 97.7 F (36.5 C) 98.5 F (36.9 C) 97.5 F (36.4 C)  TempSrc: Oral Oral Oral Oral  SpO2: 100% 100% 99% 99%  Weight:  162 lb (73.5 kg)    Height:       Constitutional: NAD, appears comfortable. Elderly man sitting up in bed.  HEENT: Atraumatic, normocephalic. PERRL, anicteric sclera.  Neck: Supple, trachea midline.  Cardiovascular: RRR, no murmurs, rubs, or gallops. Mechanical MV. Pulmonary/Chest: CTAB, no wheezes, rales, or rhonchi. No chest wall abnormalities.  Abdominal: Soft, non tender, non distended. +BS.  Extremities: RUE with large hematoma formation. Edematous with overlying ecchymoses. R digits cool to touch, but motor function and sensation intact. Radial and Ulnar pulses 1+.  Neurological: A&Ox3, CN II - XII grossly intact.  Skin: Multiple ecchymosis of varying sizes and color on bilateral upper and lower extremities, anterior chest wall, and L flank.   Psychiatric: Normal mood and affect  Labs:  Ref. Range 01/30/2016 04:46  WBC Latest Ref Range: 4.0 - 10.5 K/uL 10.5  RBC Latest Ref Range: 4.22 - 5.81 MIL/uL 2.84 (L)  Hemoglobin Latest Ref Range: 13.0 - 17.0 g/dL 8.0 (L)  HCT Latest Ref Range: 39.0 - 52.0 % 24.6 (L)  MCV Latest Ref Range: 78.0 - 100.0 fL 86.6  MCH Latest Ref Range: 26.0 - 34.0 pg 28.2  MCHC Latest Ref Range: 30.0 - 36.0 g/dL 16.1  RDW Latest Ref Range: 11.5 - 15.5 % 18.8 (H)  Platelets Latest Ref Range: 150 - 400 K/uL 350    Ref. Range 01/29/2016 09:48 01/29/2016 11:31 01/29/2016 12:36 01/29/2016 18:48 01/30/2016 04:46  INR Unknown  7.76 (HH)   7.44 (HH) 7.85 (HH)   Assessment/Plan: Mr. Ballinas is a 70 yo M with a pmhx of MV endocarditis 2/2 to polysubstance use now s/p MVR with mechanical valve on chronic warfarin who presents with complaints of easy bruising and found to have a supra therapeutic INR of 7.76.  Supra therapeutic INR: On warfarin for mechanical value replacement (goal 2.5 - 3.5). Dose was recently increased from 5 mg to 10 mg on 11/13/15. Last checked on 01/06/16 at which point INR was 3.2. Elevated on presentation to 7.76. At that time patient found to have multiple ecchymosis but no active source of bleeding. Patient last took his warfarin the morning of admission. Overnight developed R brachial hematoma after phlebotomy lab draw. RUE very swollen with overlying ecchymosis. Patient is at high risk for compartment syndrome. INR 7.85 this morning, now s/p one dose of po Vik K 2.5mg .  -- Hold warfarin  -- f/u INR this afternoon; will consider giving  IV Vit K and possibly FFP if INR remains elevated -- HGB stable; continue to monitor  -- Ortho consult today   Normocytic Anemia: Currently asymptomatic. Baseline hgb of 12. CBC on admission with hgb 8.6. Likely 2/2 to multiple ecchymosis in the setting of supra therapeutic INR. Now with new RUE hematoma. HGB stable.  -- f/u CBC -- Monitor for now  Microscopic Hematuria: Positive HGB dipstick on UA, few  bacteria but no other signs or symptoms of infection. Possibly 2/2 to mucosal bleed in the setting of supra therapeutic INR.  -- Recheck UA after INR is back to therapeutic range -- If still present, consider outpatient work up for malignancy including CT abdomen pelvis   HFrEF: Last EF 35-40% (10/02/15). Pacemaker in place.  -- Continue home Coreg 6.25mg  BID -- Continue home Lisinopril 2.5mg  BID -- Continue home Spironolactone 12.5 mg daily -- Continue home Digoxin 0.125 mg daily   BPH:  -- Continue home finasteride 5 mg daily   Chronic Headaches:    -- Continue tylenol prn  Dispo: Anticipated discharge in approximately 2-3 day(s).   LOS: 0 days   Reymundo Poll, MD 01/30/2016, 10:28 AM Pager: [phone]

## 2016-01-30 NOTE — Progress Notes (Signed)
CRITICAL VALUE ALERT  Critical value received:  INR 8.62  Date of notification:  01/30/2016  Time of notification:  1612  Critical value read back:Yes.    Nurse who received alert:  Mariann Barter RN  MD notified (1st page):  Tera Mater MD  Time of first page:  1615  MD notified (2nd page):  Time of second page:  Responding MD:    Time MD responded:

## 2016-01-30 NOTE — Consult Note (Signed)
ORTHOPAEDIC CONSULTATION  REQUESTING PHYSICIAN: Aldine Contes, MD  Chief Complaint: Right arm swelling  HPI: [name] is a 70 y.o. male who complains of  Swelling and some pain in right forearm after a Blood draw yesterday. He was admitted with an INR of 7.7  Past Medical History:  Diagnosis Date  . Atrial fibrillation ([place])    post op.  s/p dc-cv 04/2008. previously on amiodarone  . Atrial flutter ([place])    atypical  . AV block, 1st degree    .450 msec--progressive  . 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  a- Echo 07/2008 Ef 55% mild peroprosthetic MVR with High transmitral gradient (mean 14).  b- normal coronaries by cath 12/2007  . BPH (benign prostatic hyperplasia)   . CHF (congestive heart failure) ([facility])    EF 35-40 % 2011 due to valvular disease and diastolic dysfunction  . Chronic back pain   . Dysphagia    with normal barium swallow 09/2008  . Endocarditis   . GERD (gastroesophageal reflux disease)   . Heavy alcohol use    history  . History of GI bleed   . Hypertension   . IV drug abuse    history of  . Lower GI bleed 01/2016  . Pacemaker 2010   Past Surgical History:  Procedure Laterality Date  . CARDIAC VALVE REPLACEMENT     mitral valve, st jude mechanical   . COLONOSCOPY N/A 03/26/2014   Procedure: COLONOSCOPY;  Surgeon: Milus Banister, MD;  Location: [place];  Service: Endoscopy;  Laterality: N/A;  . ENTEROSCOPY N/A 03/28/2014   Procedure: ENTEROSCOPY;  Surgeon: Milus Banister, MD;  Location: [place];  Service: Endoscopy;  Laterality: N/A;  . ESOPHAGOGASTRODUODENOSCOPY N/A 03/25/2014   Procedure: ESOPHAGOGASTRODUODENOSCOPY (EGD);  Surgeon: Gatha Mayer, MD;  Location: [facility] ENDOSCOPY;  Service: Endoscopy;  Laterality: N/A;  . GIVENS CAPSULE STUDY N/A 03/26/2014   Procedure: GIVENS CAPSULE STUDY;  Surgeon: Milus Banister, MD;  Location: [place];  Service: Endoscopy;  Laterality: N/A;  .  PACEMAKER INSERTION     inserted about 4-5 years ago   Social History   Social History  . Marital status: Divorced    Spouse name: N/A  . Number of children: 94  . Years of education: N/A   Occupational History  . retired    Social History Main Topics  . Smoking status: Former Smoker    Years: 0.00    Types: Cigarettes  . Smokeless tobacco: Never Used  . Alcohol use No  . Drug use: No  . Sexual activity: Not Asked   Other Topics Concern  . None   Social History Narrative   Exercise everyday riding a bike for 1 hour   Family History  Problem Relation Age of Onset  . Coronary artery disease Mother    No Known Allergies Prior to Admission medications   Medication Sig Start Date End Date Taking? Authorizing Provider  acetaminophen (TYLENOL) 500 MG tablet Take 1,000 mg by mouth every 6 (six) hours as needed for mild pain.   Yes Historical Provider, MD  carvedilol (COREG) 6.25 MG tablet TAKE 1 TABLET (6.25 MG TOTAL) BY MOUTH 2 (TWO) TIMES DAILY WITH A MEAL. 04/12/15  Yes Shaune Pascal Bensimhon, MD  digoxin (LANOXIN) 0.125 MG tablet TAKE 1 TABLET (0.125 MG TOTAL) BY MOUTH DAILY. 12/25/15  Yes Amy D Clegg, NP  finasteride (PROSCAR) 5 MG tablet Take 1 tablet (  5 mg total) by mouth daily. 11/13/15 11/12/16 Yes Loleta Chance, MD  lisinopril (PRINIVIL,ZESTRIL) 2.5 MG tablet Take 1 tablet (2.5 mg total) by mouth 2 (two) times daily. 12/24/15  Yes Amy D Clegg, NP  potassium chloride 20 MEQ TBCR Take 20 mEq by mouth daily. 01/03/16  Yes Amy D Clegg, NP  spironolactone (ALDACTONE) 25 MG tablet Take 0.5 tablets (12.5 mg total) by mouth daily. 12/24/15  Yes Amy D Clegg, NP  warfarin (COUMADIN) 5 MG tablet Take 2 tablets (10 mg total) by mouth daily. 12/26/15  Yes Jule Ser, DO  furosemide (LASIX) 20 MG tablet Take 1 tablet (20 mg total) by mouth daily. Patient not taking: Reported on 01/29/2016 12/24/15   Amy D Ninfa Meeker, NP   No results found.  Positive ROS: All other systems have been reviewed and  were otherwise negative with the exception of those mentioned in the HPI and as above.  Labs cbc  Recent Labs  01/29/16 1848 01/30/16 0446  WBC 8.3 10.5  HGB 9.4* 8.0*  HCT 30.2* 24.6*  PLT 387 350    Labs inflam No results for input(s): CRP in the last 72 hours.  Invalid input(s): ESR  Labs coag  Recent Labs  01/29/16 1848 01/30/16 0446  INR 7.44* 7.85*     Recent Labs  01/29/16 0948  NA 135  K 4.1  CL 104  CO2 23  GLUCOSE 115*  BUN 18  CREATININE 1.31*  CALCIUM 8.6*    Physical Exam: Vitals:   01/30/16 0534 01/30/16 0820  BP: 104/72 100/64  Pulse: (!) 113 (!) 102  Resp:  16  Temp: 98.5 F (36.9 C) 97.5 F (36.4 C)   General: Alert, no acute distress Cardiovascular: No pedal edema Respiratory: No cyanosis, no use of accessory musculature GI: No organomegaly, abdomen is soft and non-tender Skin: No lesions in the area of chief complaint other than those listed below in MSK exam.  Neurologic: Sensation intact distally save for the below mentioned MSK exam Psychiatric: Patient is competent for consent with normal mood and affect Lymphatic: No axillary or cervical lymphadenopathy  MUSCULOSKELETAL:  RUE: arm is swollen but compartments are compressible. His is distally NVI. Painless passive motion Other extremities are atraumatic with painless ROM and NVI.  Assessment: R arm hematoma  Plan: No indication for surgical compartment release at this time. We will continue to follow, please call me with any changes to his symptoms or pain.   Elevate, ice, compression   Renette Butters, MD Cell [phone]   01/30/2016 12:24 PM

## 2016-01-30 NOTE — Progress Notes (Signed)
Paged regarding R arms swelling that has progressed over the night.  Yesterday evening around 7pm, [name] had labs drawn from his [facility] and had a right brachial BP taken.  Shortly thereafter, his nurse noticed a developing hematoma at the phlebotomy site.   Over the next 8 hours, his upper and lower arm became grossly swollen, taut, and mildly painful, with ecchymoses over the medial R forearm and lateral R biceps.  He describes a mild aching pain, with no weakness, numbness, or tingling of the hand or arm.    VSS, pt in no distress.  Radial pulse intact and symmetrical, hand and arm warm and well perfused, strength and sensation intact over entire UE, mild tenderness to palpation over all compartments of arm and forearm. INR 7.44 last night.  Gross swelling of RUE after minimal trauma, but no signs of compartment syndrome at this time.  With the significant distension that does not respect fascial compartments, bleeding is likely superficial rather than into deep fascial compartments.  With his mechanical valve and falling INR, will not reverse anticoagulations at this time.  Continue following INR and H&H.  Elevate arm, apply firm pressure for at least 5 minutes for future lab draws.  Will measure circumference of arm and forearm.  Discussed signs of neurovascular compromise with nurse and patient.

## 2016-01-30 NOTE — Progress Notes (Signed)
  Date: 01/30/2016  Patient name: [name]  Medical record number: [id number]  Date of birth: February 03, 1946   I have seen and evaluated [name] and discussed their care with the Residency Team. In brief patient is a 70 y/o male with PMH of HFrEF (EF 35-40%) secondary to non ischemic CM, s/p PPM for bradycardia, MV endocarditis s/p mechanical MVR on a/c who p/w easy bruising for the past week. Patient noted bruises over his arms and legs after brushing objects with them and also noted pain in b/l thighs and pain in his left ankle. He also complains of increased fatigue over the last 2 weeks. No CP, no sob, no palpitations, no diaphoresis, no melena, no hematochezia, no hematemesis, no focal weakness, no lightheadedness/syncope.  PMHx, Fam Hx, and/or Soc Hx : as per resident admit note  Vitals:   01/30/16 0534 01/30/16 0820  BP: 104/72 100/64  Pulse: (!) 113 (!) 102  Resp:  16  Temp: 98.5 F (36.9 C) 97.5 F (36.4 C)   Gen: AAO*3, NAD CVS: RRR, no murmurs Lungs: CTA b/l Abd: soft, non tender, BS + Ext: R arm ecchymosis with swelling and tenderness over forearm and upper arm, pulses intact, sensation intact, power 4/5 (limited by pain)     Assessment and Plan: I have seen and evaluated the patient as outlined above. I agree with the formulated Assessment and Plan as detailed in the residents' note, with the following changes:   1. RUE hematoma: - Patient presented with easy bruising and was found to have an elevated INR to 7.76.  - Overnight he developed a R UE hematoma which has gradually worsened but he has no signs or symptoms of compartment syndrome and pulses are intact - Will monitor serial CBCs and transfuse prn if Hg continues to decrease - Continue to hold warfarin. Will give vitamin K 2.5 mg PO and recheck INR today - If hematoma worsens would consider FFP to lower INR - Will consider cold compresses for RUE - Ortho eval noted -no evidence of compartment  syndrome. They will follow   Earl Lagos, MD 7/27/20172:08 PM

## 2016-01-31 DIAGNOSIS — I4892 Unspecified atrial flutter: Secondary | ICD-10-CM | POA: Diagnosis present

## 2016-01-31 DIAGNOSIS — Z79899 Other long term (current) drug therapy: Secondary | ICD-10-CM | POA: Diagnosis not present

## 2016-01-31 DIAGNOSIS — R791 Abnormal coagulation profile: Secondary | ICD-10-CM | POA: Diagnosis not present

## 2016-01-31 DIAGNOSIS — S40029A Contusion of unspecified upper arm, initial encounter: Secondary | ICD-10-CM

## 2016-01-31 DIAGNOSIS — S5011XA Contusion of right forearm, initial encounter: Secondary | ICD-10-CM | POA: Diagnosis present

## 2016-01-31 DIAGNOSIS — R51 Headache: Secondary | ICD-10-CM | POA: Diagnosis present

## 2016-01-31 DIAGNOSIS — N400 Enlarged prostate without lower urinary tract symptoms: Secondary | ICD-10-CM | POA: Diagnosis present

## 2016-01-31 DIAGNOSIS — S40021A Contusion of right upper arm, initial encounter: Secondary | ICD-10-CM | POA: Diagnosis not present

## 2016-01-31 DIAGNOSIS — R3129 Other microscopic hematuria: Secondary | ICD-10-CM | POA: Diagnosis present

## 2016-01-31 DIAGNOSIS — Z7901 Long term (current) use of anticoagulants: Secondary | ICD-10-CM | POA: Diagnosis not present

## 2016-01-31 DIAGNOSIS — D62 Acute posthemorrhagic anemia: Secondary | ICD-10-CM | POA: Diagnosis present

## 2016-01-31 DIAGNOSIS — I428 Other cardiomyopathies: Secondary | ICD-10-CM | POA: Diagnosis present

## 2016-01-31 DIAGNOSIS — Z950 Presence of cardiac pacemaker: Secondary | ICD-10-CM | POA: Diagnosis not present

## 2016-01-31 DIAGNOSIS — M7989 Other specified soft tissue disorders: Secondary | ICD-10-CM | POA: Diagnosis not present

## 2016-01-31 DIAGNOSIS — Z954 Presence of other heart-valve replacement: Secondary | ICD-10-CM | POA: Diagnosis not present

## 2016-01-31 DIAGNOSIS — T45515A Adverse effect of anticoagulants, initial encounter: Secondary | ICD-10-CM | POA: Diagnosis present

## 2016-01-31 DIAGNOSIS — Z952 Presence of prosthetic heart valve: Secondary | ICD-10-CM | POA: Diagnosis not present

## 2016-01-31 DIAGNOSIS — D649 Anemia, unspecified: Secondary | ICD-10-CM | POA: Diagnosis not present

## 2016-01-31 DIAGNOSIS — Y847 Blood-sampling as the cause of abnormal reaction of the patient, or of later complication, without mention of misadventure at the time of the procedure: Secondary | ICD-10-CM | POA: Diagnosis present

## 2016-01-31 DIAGNOSIS — I110 Hypertensive heart disease with heart failure: Secondary | ICD-10-CM | POA: Diagnosis present

## 2016-01-31 DIAGNOSIS — I4891 Unspecified atrial fibrillation: Secondary | ICD-10-CM | POA: Diagnosis present

## 2016-01-31 DIAGNOSIS — I5022 Chronic systolic (congestive) heart failure: Secondary | ICD-10-CM | POA: Diagnosis present

## 2016-01-31 DIAGNOSIS — K219 Gastro-esophageal reflux disease without esophagitis: Secondary | ICD-10-CM | POA: Diagnosis present

## 2016-01-31 LAB — CBC
HCT: 19.5 % — ABNORMAL LOW (ref 39.0–52.0)
HEMATOCRIT: 25.6 % — AB (ref 39.0–52.0)
HEMOGLOBIN: 7.8 g/dL — AB (ref 13.0–17.0)
Hemoglobin: 6.4 g/dL — CL (ref 13.0–17.0)
MCH: 28.6 pg (ref 26.0–34.0)
MCH: 27.3 pg (ref 26.0–34.0)
MCHC: 32.8 g/dL (ref 30.0–36.0)
MCHC: 30.5 g/dL (ref 30.0–36.0)
MCV: 87.1 fL (ref 78.0–100.0)
MCV: 89.5 fL (ref 78.0–100.0)
PLATELETS: 269 10*3/uL (ref 150–400)
Platelets: 313 10*3/uL (ref 150–400)
RBC: 2.24 MIL/uL — ABNORMAL LOW (ref 4.22–5.81)
RBC: 2.86 MIL/uL — ABNORMAL LOW (ref 4.22–5.81)
RDW: 19.4 % — AB (ref 11.5–15.5)
RDW: 19.3 % — ABNORMAL HIGH (ref 11.5–15.5)
WBC: 10.8 10*3/uL — ABNORMAL HIGH (ref 4.0–10.5)
WBC: 11.5 10*3/uL — ABNORMAL HIGH (ref 4.0–10.5)

## 2016-01-31 LAB — PROTIME-INR
INR: 1.86
INR: 2.1
PROTHROMBIN TIME: 23.9 s — AB (ref 11.4–15.2)
Prothrombin Time: 21.7 seconds — ABNORMAL HIGH (ref 11.4–15.2)

## 2016-01-31 LAB — PREPARE RBC (CROSSMATCH)

## 2016-01-31 MED ORDER — WARFARIN - PHARMACIST DOSING INPATIENT: Freq: Every day | Status: DC

## 2016-01-31 MED ORDER — WARFARIN SODIUM 5 MG PO TABS
5.0000 mg | ORAL_TABLET | Freq: Once | ORAL | Status: AC
  Administered 2016-01-31: 5 mg via ORAL
  Filled 2016-01-31: qty 1

## 2016-01-31 MED ORDER — ENOXAPARIN SODIUM 80 MG/0.8ML ~~LOC~~ SOLN
75.0000 mg | Freq: Two times a day (BID) | SUBCUTANEOUS | Status: DC
  Administered 2016-01-31 – 2016-02-01 (×3): 75 mg via SUBCUTANEOUS
  Filled 2016-01-31 (×3): qty 0.8

## 2016-01-31 MED ORDER — SODIUM CHLORIDE 0.9 % IV SOLN
Freq: Once | INTRAVENOUS | Status: AC
Start: 2016-01-31 — End: 2016-01-31
  Administered 2016-01-31: 11:00:00 via INTRAVENOUS

## 2016-01-31 NOTE — Progress Notes (Signed)
Subjective: Continues to complain of pain and swelling in the RUE as well as some mild pain in his L ankle. Pain is 8/10 this morning relieved with prns.   Objective:  Vital signs in last 24 hours: Vitals:   01/31/16 0236 01/31/16 0255 01/31/16 0500 01/31/16 0900  BP: (!) 98/45 (!) 112/50 (!) 99/58 (!) 94/47  Pulse: 94 73 100 (!) 105  Resp: Temp: 97.9 F (36.6 C) 98.5 F (36.9 C) 97.3 F (36.3 C) 98.5 F (36.9 C)  TempSrc: Oral Oral Oral Oral  SpO2: 100% 100% 100% 100%  Weight:      Height:       Constitutional: NAD, appears comfortable. Elderly man sitting up in bed.  HEENT: Atraumatic, normocephalic. PERRL, anicteric sclera.  Neck: Supple, trachea midline.  Cardiovascular: RRR, no murmurs, rubs, or gallops. Mechanical MV. Pulmonary/Chest: CTAB, no wheezes, rales, or rhonchi. No chest wall abnormalities.  Abdominal: Soft, non tender, non distended. +BS.  Extremities: RUE with large hematoma formation. Edematous with overlying ecchymoses. R digits cool to touch, but motor function and sensation intact. Radial and Ulnar pulses symmetric bilaterally and 2+. Neurological: A&Ox3, CN II - XII grossly intact.  Skin: Multiple ecchymosis of varying sizes and color on bilateral upper and lower extremities, anterior chest wall, and L flank.   Psychiatric: Normal mood and affect  Labs:   Ref. Range 01/29/2016 18:48 01/30/2016 04:46 01/30/2016 15:08 01/30/2016 16:34 01/30/2016 16:34 01/31/2016 07:04  INR Unknown 7.44 (HH) 7.85 (HH) 8.62 (HH)   1.86    Ref. Range 01/31/2016 07:04  WBC Latest Ref Range: 4.0 - 10.5 K/uL 10.8 (H)  RBC Latest Ref Range: 4.22 - 5.81 MIL/uL 2.24 (L)  Hemoglobin Latest Ref Range: 13.0 - 17.0 g/dL 6.4 (LL)  HCT Latest Ref Range: 39.0 - 52.0 % 19.5 (L)  MCV Latest Ref Range: 78.0 - 100.0 fL 87.1  MCH Latest Ref Range: 26.0 - 34.0 pg 28.6  MCHC Latest Ref Range: 30.0 - 36.0 g/dL 16.1  RDW Latest Ref Range: 11.5 - 15.5 % 19.4 (H)  Platelets Latest Ref  Range: 150 - 400 K/uL 269    Assessment/Plan: [name] is a 70 yo M with a pmhx of MV endocarditis 2/2 to polysubstance use now s/p MVR with mechanical valve on chronic warfarin who presents with complaints of easy bruising and found to have a supra therapeutic INR of 7.76.  Supra therapeutic INR: On warfarin for mechanical value replacement (goal 2.5 - 3.5). Dose was recently increased from 5 mg to 10 mg on 11/13/15. Last checked on 01/06/16 at which point INR was 3.2. Elevated on presentation to 7.76. At that time patient found to have multiple ecchymosis but no active source of bleeding. Patient last took his warfarin the morning of admission. Now s/p one dose of po Vik K 2.5mg ,  IV Vit K, and 2 units of FFP on 01/30/16. INR this morning now 1.86.  -- f/u INR -- Pharmacy consult for warfarin and lovenox dosing   RUE Hematoma: Overnight after admission patient developed R brachial hematoma after phlebotomy lab draw. RUE very swollen with overlying ecchymosis. Patient is at high risk for compartment syndrome.  -- Ortho following; appreciate recs -- Continue ice, ace wrap compression, and elevation  -- Oxy 5 q4hrs prn for pain  Normocytic Anemia: Currently asymptomatic. Baseline hgb of 12. CBC on admission with hgb 8.6. Likely 2/2 to multiple ecchymosis in the setting of supra therapeutic INR. Now with new RUE  hematoma. HGB today 6.4; no signs of active bleeding.  -- Transfuse 1 unit today  -- f/u Post transfusion H&H -- Continue to monitor   Microscopic Hematuria: Positive HGB dipstick on UA, few bacteria but no other signs or symptoms of infection. Possibly 2/2 to mucosal bleed in the setting of supra therapeutic INR.  -- Recheck UA after INR is back to therapeutic range -- If still present, consider outpatient work up for malignancy including CT abdomen pelvis   HFrEF: Last EF 35-40% (10/02/15). Pacemaker in place.  -- Continue home Coreg 6.25mg  BID -- Continue home Lisinopril 2.5mg   BID -- Continue home Spironolactone 12.5 mg daily -- Continue home Digoxin 0.125 mg daily   BPH:  -- Continue home finasteride 5 mg daily   Chronic Headaches:  -- Continue tylenol prn  Dispo: Anticipated discharge in approximately 2-3 day(s).   LOS: 0 days   Richard Poll, MD 01/31/2016, 10:53 AM Pager: [phone]

## 2016-01-31 NOTE — Discharge Instructions (Signed)
Information on my medicine - Coumadin   (Warfarin)  This medication education was reviewed with me or my healthcare representative as part of my discharge preparation.  The pharmacist that spoke with me during my hospital stay was:  Allena Katz, [facility]  Why was Coumadin prescribed for you? Coumadin was prescribed for you because you have a blood clot or a medical condition that can cause an increased risk of forming blood clots. Blood clots can cause serious health problems by blocking the flow of blood to the heart, lung, or brain. Coumadin can prevent harmful blood clots from forming. As a reminder your indication for Coumadin is:   Blood Clot Prevention After Heart Valve Surgery  What test will check on my response to Coumadin? While on Coumadin (warfarin) you will need to have an INR test regularly to ensure that your dose is keeping you in the desired range. The INR (international normalized ratio) number is calculated from the result of the laboratory test called prothrombin time (PT).  If an INR APPOINTMENT HAS NOT ALREADY BEEN MADE FOR YOU please schedule an appointment to have this lab work done by your health care provider within 7 days. Your INR goal is usually a number between:  2 to 3 or your provider may give you a more narrow range like 2-2.5.  Ask your health care provider during an office visit what your goal INR is.  What  do you need to  know  About  COUMADIN? Take Coumadin (warfarin) exactly as prescribed by your healthcare provider about the same time each day.  DO NOT stop taking without talking to the doctor who prescribed the medication.  Stopping without other blood clot prevention medication to take the place of Coumadin may increase your risk of developing a new clot or stroke.  Get refills before you run out.  What do you do if you miss a dose? If you miss a dose, take it as soon as you remember on the same day then continue your regularly scheduled regimen the next  day.  Do not take two doses of Coumadin at the same time.  Important Safety Information A possible side effect of Coumadin (Warfarin) is an increased risk of bleeding. You should call your healthcare provider right away if you experience any of the following: ? Bleeding from an injury or your nose that does not stop. ? Unusual colored urine (red or dark brown) or unusual colored stools (red or black). ? Unusual bruising for unknown reasons. ? A serious fall or if you hit your head (even if there is no bleeding).  Some foods or medicines interact with Coumadin (warfarin) and might alter your response to warfarin. To help avoid this: ? Eat a balanced diet, maintaining a consistent amount of Vitamin K. ? Notify your provider about major diet changes you plan to make. ? Avoid alcohol or limit your intake to 1 drink for women and 2 drinks for men per day. (1 drink is 5 oz. wine, 12 oz. beer, or 1.5 oz. liquor.)  Make sure that ANY health care provider who prescribes medication for you knows that you are taking Coumadin (warfarin).  Also make sure the healthcare provider who is monitoring your Coumadin knows when you have started a new medication including herbals and non-prescription products.  Coumadin (Warfarin)  Major Drug Interactions  Increased Warfarin Effect Decreased Warfarin Effect  Alcohol (large quantities) Antibiotics (esp. Septra/Bactrim, Flagyl, Cipro) Amiodarone (Cordarone) Aspirin (ASA) Cimetidine (Tagamet) Megestrol (Megace) NSAIDs (  ibuprofen, naproxen, etc.) °Piroxicam (Feldene) °Propafenone (Rythmol SR) °Propranolol (Inderal) °Isoniazid (INH) °Posaconazole (Noxafil) Barbiturates (Phenobarbital) °Carbamazepine (Tegretol) °Chlordiazepoxide (Librium) °Cholestyramine (Questran) °Griseofulvin °Oral Contraceptives °Rifampin °Sucralfate (Carafate) °Vitamin K  ° °Coumadin® (Warfarin) Major Herbal Interactions  °Increased Warfarin Effect Decreased Warfarin Effect   °Garlic °Ginseng °Ginkgo biloba Coenzyme Q10 °Green tea °St. John’s wort   ° °Coumadin® (Warfarin) FOOD Interactions  °Eat a consistent number of servings per week of foods HIGH in Vitamin K °(1 serving = ½ cup)  °Collards (cooked, or boiled & drained) °Kale (cooked, or boiled & drained) °Mustard greens (cooked, or boiled & drained) °Parsley *serving size only = ¼ cup °Spinach (cooked, or boiled & drained) °Swiss chard (cooked, or boiled & drained) °Turnip greens (cooked, or boiled & drained)  °Eat a consistent number of servings per week of foods MEDIUM-HIGH in Vitamin K °(1 serving = 1 cup)  °Asparagus (cooked, or boiled & drained) °Broccoli (cooked, boiled & drained, or raw & chopped) °Brussel sprouts (cooked, or boiled & drained) *serving size only = ½ cup °Lettuce, raw (green leaf, endive, romaine) °Spinach, raw °Turnip greens, raw & chopped  ° °These websites have more information on Coumadin (warfarin):  www.coumadin.com; °www.ahrq.gov/consumer/coumadin.htm; ° ° ° °

## 2016-01-31 NOTE — Care Management Obs Status (Signed)
MEDICARE OBSERVATION STATUS NOTIFICATION   Patient Details  Name: [name] MRN: [id number] Date of Birth: 1946/05/29   Medicare Observation Status Notification Given:  Yes OBS status info given to pt for 01/30/2016 , pt changed to inpt status on 01/31/2016.   Julieanna Geraci, Annamarie Major, RN 01/31/2016, 1:48 PM

## 2016-01-31 NOTE — Progress Notes (Signed)
ANTICOAGULATION CONSULT NOTE - Follow Up Consult  Pharmacy Consult for warfarin + enoxaparin Indication: MVR  No Known Allergies  Patient Measurements: Height: 5\' 8"  (172.7 cm) Weight: 162 lb (73.5 kg) IBW/kg (Calculated) : 68.4  Vital Signs: Temp: 97.3 F (36.3 C) (07/28 0500) Temp Source: Oral (07/28 0500) BP: 99/58 (07/28 0500) Pulse Rate: 100 (07/28 0500)  Labs:  Recent Labs  01/29/16 0948  01/30/16 0446 01/30/16 1508 01/31/16 0704  HGB 8.6*  < > 8.0* 8.0* 6.4*  HCT 27.9*  < > 24.6* 25.9* 19.5*  PLT 309  < > 350 373 269  LABPROT 68.0*  < > 68.6* 73.9* 21.7*  INR 7.76*  < > 7.85* 8.62* 1.86  CREATININE 1.31*  --   --   --   --   < > = values in this interval not displayed.  Estimated Creatinine Clearance: 49.3 mL/min (by C-G formula based on SCr of 1.31 mg/dL).    Assessment: 70yo Male admitted with elevated INR (7.76) that continued to trend upward. PTA warfarin dose 10 mg po daily. INR is 1.86 today s/p IV and PO vitamin K on 7/27. Hgb very low at at 6.4 today (decreased from 8.6 upon admission). Spoke with MD, plan to restart warfarin with lovenox bridge as full effects of vitamin K doses are not reflected in INR yet (full effect of PO vitamin K is seen 48-72 hrs). Pt has FFP and blood units ordered to transfuse today.     Goal of Therapy:  INR goal 2.5-3.5 Monitor platelets by anticoagulation protocol: Yes   Plan:  Continue lovenox 75 mg subcut q12h Restart warfarin at 5mg  po x1  Monitor INR, CBC daily, clinical picture, s/sx bleeding/clotting, PO intake, DDI  Allena Katz, Pharm.D. PGY1 Pharmacy Resident 7/28/20178:58 AM Pager [phone]

## 2016-01-31 NOTE — Progress Notes (Signed)
     Assessment: Active Problems:   H/O mitral valve replacement with mechanical valve   Chronic anticoagulation   Anemia   Hematoma of arm  Right arm hematoma Stable since yesterday.  INR up again yesterday PM (8.62).  A.M. Labs today pending.  Compartments compressible.  NVI distally.  +radial pulses symmetrical.  Painless passive and active ROM of wrist and all fingers.  Plan: No indication for surgical compartment release at this time.  Elevate, apply ice and compression - same discussed with patient.  Please call with any changes to his symptoms or pain.   Subjective: Patient reports pain as mild to moderate - stable since yesterday, and controlled with medicines.  Objective:   VITALS:   Vitals:   01/31/16 0020 01/31/16 0236 01/31/16 0255 01/31/16 0500  BP: 90/66 (!) 98/45 (!) 112/50 (!) 99/58  Pulse: 96 94 73 100  Resp: 16 16 16 16   Temp: 98.6 F (37 C) 97.9 F (36.6 C) 98.5 F (36.9 C) 97.3 F (36.3 C)  TempSrc: Oral Oral Oral Oral  SpO2: 100% 100% 100% 100%  Weight:      Height:        Physical Exam General: NAD.  Sitting upright in bed.  Friend at bedside. MSK: Right arm Compartments compressible.  NVI distally.  +radial pulses symmetrical.  Painless passive and active ROM of wrist and all fingers.    Albina Billet III, PA-C 01/31/2016, 7:36 AM

## 2016-01-31 NOTE — Progress Notes (Signed)
ANTICOAGULATION CONSULT NOTE - Initial Consult  Pharmacy Consult for Lovenox Indication: MVR  No Known Allergies  Patient Measurements: Height:  (172.7 cm) Weight: 162 lb (73.5 kg) IBW/kg (Calculated) : 68.4  Vital Signs: Temp: 97.3 F (36.3 C) (07/28 0500) Temp Source: Oral (07/28 0500) BP: 99/58 (07/28 0500) Pulse Rate: 100 (07/28 0500)  Labs:  Recent Labs  01/29/16 0948 01/29/16 1848 01/30/16 0446 01/30/16 1508 01/31/16 0704  HGB 8.6* 9.4* 8.0* 8.0*  --   HCT 27.9* 30.2* 24.6* 25.9*  --   PLT 309 387 350 373  --   LABPROT 68.0* 65.7* 68.6* 73.9* 21.7*  INR 7.76* 7.44* 7.85* 8.62* 1.86  CREATININE 1.31*  --   --   --   --     Estimated Creatinine Clearance: 49.3 mL/min (by C-G formula based on SCr of 1.31 mg/dL).   Medical History: Past Medical History:  Diagnosis Date  . Atrial fibrillation (HCC)    post op.  s/p dc-cv 04/2008. previously on amiodarone  . Atrial flutter (HCC)    atypical  . AV block, 1st degree    .450 msec--progressive  . 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  a- Echo 07/2008 Ef 55% mild peroprosthetic MVR with High transmitral gradient (mean 14).  b- normal coronaries by cath 12/2007  . BPH (benign prostatic hyperplasia)   . CHF (congestive heart failure) (HCC)    EF 35-40 % 2011 due to valvular disease and diastolic dysfunction  . Chronic back pain   . Dysphagia    with normal barium swallow 09/2008  . Endocarditis   . GERD (gastroesophageal reflux disease)   . Heavy alcohol use    history  . History of GI bleed   . Hypertension   . IV drug abuse    history of  . Lower GI bleed 01/2016  . Pacemaker 2010    Medications:  Prescriptions Prior to Admission  Medication Sig Dispense Refill Last Dose  . acetaminophen (TYLENOL) 500 MG tablet Take 1,000 mg by mouth every 6 (six) hours as needed for mild pain.   01/29/2016 at Unknown time  . carvedilol (COREG) 6.25 MG tablet TAKE 1 TABLET  (6.25 MG TOTAL) BY MOUTH 2 (TWO) TIMES DAILY WITH A MEAL. 60 tablet 6 01/29/2016 at 0800  . digoxin (LANOXIN) 0.125 MG tablet TAKE 1 TABLET (0.125 MG TOTAL) BY MOUTH DAILY. 90 tablet 3 01/29/2016 at Unknown time  . finasteride (PROSCAR) 5 MG tablet Take 1 tablet (5 mg total) by mouth daily. 90 tablet 3 01/29/2016 at Unknown time  . lisinopril (PRINIVIL,ZESTRIL) 2.5 MG tablet Take 1 tablet (2.5 mg total) by mouth 2 (two) times daily. 60 tablet 6 01/29/2016 at Unknown time  . potassium chloride 20 MEQ TBCR Take 20 mEq by mouth daily. 30 tablet 3 01/29/2016 at Unknown time  . spironolactone (ALDACTONE) 25 MG tablet Take 0.5 tablets (12.5 mg total) by mouth daily. 15 tablet 3 01/29/2016 at Unknown time  . warfarin (COUMADIN) 5 MG tablet Take 2 tablets (10 mg total) by mouth daily. 60 tablet 2 01/29/2016 at 0800  . furosemide (LASIX) 20 MG tablet Take 1 tablet (20 mg total) by mouth daily. (Patient not taking: Reported on 01/29/2016) 30 tablet 6 Not Taking at Unknown time   Scheduled:  . carvedilol  6.25 mg Oral BID WC  . digoxin  0.125 mg Oral Daily  . finasteride  5 mg Oral Daily  . lisinopril  2.5  mg Oral BID  . spironolactone  12.5 mg Oral Daily    Assessment: 70yo male on Coumadin PTA for MVR (goal INR 2.5-3.5), INR had been elevated during admission, now s/p vitamin K, INR below goal, to begin LMWH bridge.  Goal of Therapy:  Anti-Xa level 0.6-1 units/ml 4hrs after LMWH dose given Monitor platelets by anticoagulation protocol: Yes   Plan:  Will start Lovenox 75mg  SQ Q12H and monitor CBC.  Vernard Gambles, PharmD, BCPS  01/31/2016,7:54 AM

## 2016-01-31 NOTE — Progress Notes (Signed)
CRITICAL VALUE ALERT  Critical value received:  Hemoglobin 6.4 Date of notification:  01/31/2016  Time of notification:  0802  Critical value read back:Yes.    Nurse who received alert:  Caryl Asp RN  MD notified (1st page):  Heide Spark  Time of first page:  [phone]  MD notified (2nd page):  Time of second page:  Responding MD:    Time MD responded:

## 2016-01-31 NOTE — Progress Notes (Addendum)
  Date: 01/31/2016  Patient name: [name]  Medical record number: [id number]  Date of birth: 01/15/1946   Patient seen and examined. Case d/w residents in detail. I agree with findings and plan as documented in Dr. Jule Ser note.  Patient still complains of pain in R UE. Noted to still have diffuse swelling and ecchymosis over R UE but swelling has improved from yesterday. He is also noted to have pain and ecchymosis over b/l LE but no abd pain or ecchymosis over his abdomen or flank or back.  Hg decreased to 6.4 today. Will transfuse 1 unit PRBC. Anemia likely secondary to acute blood loss from hematoma in his R UE and bruising over his b/l LE from his supratherapeutic INR on admission. No signs or symptoms of compartment syndrome. Ortho following.  His INR today has decreased to 1.8 after vitamin K and FFP. Given his mechanical MVR he will need bridging with lovenox to warfarin per pharmacy and monitor for signs of bleeding.    Earl Lagos, MD 01/31/2016, 1:54 PM

## 2016-02-01 DIAGNOSIS — R791 Abnormal coagulation profile: Secondary | ICD-10-CM

## 2016-02-01 DIAGNOSIS — Z954 Presence of other heart-valve replacement: Secondary | ICD-10-CM

## 2016-02-01 DIAGNOSIS — S40021A Contusion of right upper arm, initial encounter: Secondary | ICD-10-CM

## 2016-02-01 DIAGNOSIS — D649 Anemia, unspecified: Secondary | ICD-10-CM

## 2016-02-01 LAB — CBC
HCT: 22.2 % — ABNORMAL LOW (ref 39.0–52.0)
HCT: 28.4 % — ABNORMAL LOW (ref 39.0–52.0)
Hemoglobin: 7.2 g/dL — ABNORMAL LOW (ref 13.0–17.0)
Hemoglobin: 8.7 g/dL — ABNORMAL LOW (ref 13.0–17.0)
MCH: 28.3 pg (ref 26.0–34.0)
MCH: 27.4 pg (ref 26.0–34.0)
MCHC: 32.4 g/dL (ref 30.0–36.0)
MCHC: 30.6 g/dL (ref 30.0–36.0)
MCV: 87.4 fL (ref 78.0–100.0)
MCV: 89.6 fL (ref 78.0–100.0)
PLATELETS: 285 10*3/uL (ref 150–400)
PLATELETS: 368 10*3/uL (ref 150–400)
RBC: 2.54 MIL/uL — ABNORMAL LOW (ref 4.22–5.81)
RBC: 3.17 MIL/uL — ABNORMAL LOW (ref 4.22–5.81)
RDW: 20.5 % — AB (ref 11.5–15.5)
RDW: 20.5 % — AB (ref 11.5–15.5)
WBC: 9.8 10*3/uL (ref 4.0–10.5)
WBC: 10 10*3/uL (ref 4.0–10.5)

## 2016-02-01 LAB — PROTIME-INR
INR: 2.44
PROTHROMBIN TIME: 26.9 s — AB (ref 11.4–15.2)

## 2016-02-01 LAB — PREPARE FRESH FROZEN PLASMA
UNIT DIVISION: 0
Unit division: 0

## 2016-02-01 LAB — TYPE AND SCREEN
ABO/RH(D): O POS
Antibody Screen: NEGATIVE
UNIT DIVISION: 0

## 2016-02-01 MED ORDER — WARFARIN SODIUM 5 MG PO TABS: 5.0000 mg | ORAL_TABLET | Freq: Every day | ORAL | 0 refills | Status: DC

## 2016-02-01 MED ORDER — WARFARIN SODIUM 5 MG PO TABS: 5.0000 mg | ORAL_TABLET | Freq: Once | ORAL | Status: DC

## 2016-02-01 MED ORDER — WARFARIN SODIUM 2.5 MG PO TABS: 2.5000 mg | ORAL_TABLET | Freq: Once | ORAL | Status: DC

## 2016-02-01 NOTE — Progress Notes (Signed)
Internal Medicine Attending  Date: 02/01/2016  Patient name: [name] Medical record number: [id number] Date of birth: 07-06-1946 Age: 70 y.o. Gender: male  I saw and evaluated the patient. I reviewed the resident's note by Dr. Isabella Bowens and I agree with the resident's findings and plans as documented in her progress note.  His right arm swelling has decreased compared to yesterday per report. His INR is now at target. I agree with the plans for discharge home today.

## 2016-02-01 NOTE — Progress Notes (Signed)
   Subjective: Doing well this morning. He reports his leg pain has improved. Arm is stable. No new complaints otherwise.  Objective:  Vital signs in last 24 hours: Vitals:   01/31/16 1713 01/31/16 2036 02/01/16 0437 02/01/16 1000  BP: (!) 113/55 98/61 (!) 108/58 (!) 97/54  Pulse: 100 95 86 85  Resp: 16 17 17 18   Temp: 98.1 F (36.7 C) 99.5 F (37.5 C) 98.6 F (37 C) 98.4 F (36.9 C)  TempSrc: Oral   Oral  SpO2:  98% 100% 100%  Weight:  75.8 kg (167 lb)    Height:       General Apperance: NAD HEENT: Normocephalic, atraumatic, anicteric sclera Neck: Supple, trachea midline Lungs: Clear to auscultation bilaterally. No wheezes, rhonchi or rales. Breathing comfortably Heart: Regular rate and rhythm Abdomen: Soft, nontender, nondistended, no rebound/guarding Extremities: Warm and well perfused, RUE swollen but improved. Skin: Ecchymoses on bilateral arms and legs and chest. Neurologic: Alert and interactive. No gross deficits.   Assessment/Plan: 70 year old man with history of MV endocarditis 2/2 to polysubstance use now s/p MVR with mechanical valve on chronic warfarin who presents with complaints of easy bruising and found to have a supra therapeutic INR of 7.76.  Supratherapeutic INR: On warfarin for mechanical value replacement (goal 2.5 - 3.5). INR this morning up to 2.44 from 2.1 yesterday afternoon. - Continue warfarin  RUE Hematoma: Stable with slight improvement swelling overall. - Continue ice, compression, and elevation  - Oxy 5 q4hrs prn for pain  Acute Blood Loss Anemia: Transfused 1 unit of pRBC on 7/28. Hgb up to 7.8 post transfusion from 6.4. Today it is 7.2 - Recheck CBC. Transfuse prn <7  Microscopic Hematuria: Positive HGB dipstick on UA. Possibly 2/2 to mucosal bleed in the setting of supra therapeutic INR.  - Recheck UA at outpatient followup. - If still present, consider outpatient work up for malignancy including CT abdomen/pelvis   HFrEF: EF  35-40% on echo 10/02/15. Pacemaker in place.  - Continue home Coreg 6.25mg  BID - Continue home Lisinopril 2.5mg  BID - Continue home Spironolactone 12.5 mg daily - Continue home Digoxin 0.125 mg daily   BPH: Continue home finasteride 5 mg daily   Chronic Headaches: Continue tylenol prn  FEN: Heart healthy CODE: FULL  Dispo: Anticipated discharge in approximately 0-1 day(s).   LOS: 1 day   Lora Paula, MD 02/01/2016, 11:02 AM Pager: [phone]

## 2016-02-01 NOTE — Discharge Summary (Signed)
Name: [name] MRN: [id number] DOB: Aug 06, 1945 70 y.o. PCP: [name], DO  Date of Admission: 01/29/2016 10:41 AM Date of Discharge: 02/01/2016 Attending Physician: Earl Lagos, MD  Discharge Diagnosis: 1. Supra therapeutic INR Active Problems:   H/O mitral valve replacement with mechanical valve   Chronic anticoagulation   Anemia   Hematoma of arm   Supratherapeutic INR   Absolute anemia   Discharge Medications:   Medication List    TAKE these medications   acetaminophen 500 MG tablet Commonly known as:  TYLENOL Take 1,000 mg by mouth every 6 (six) hours as needed for mild pain.   carvedilol 6.25 MG tablet Commonly known as:  COREG TAKE 1 TABLET (6.25 MG TOTAL) BY MOUTH 2 (TWO) TIMES DAILY WITH A MEAL.   digoxin 0.125 MG tablet Commonly known as:  LANOXIN TAKE 1 TABLET (0.125 MG TOTAL) BY MOUTH DAILY.   finasteride 5 MG tablet Commonly known as:  PROSCAR Take 1 tablet (5 mg total) by mouth daily.   furosemide 20 MG tablet Commonly known as:  LASIX Take 1 tablet (20 mg total) by mouth daily.   lisinopril 2.5 MG tablet Commonly known as:  PRINIVIL,ZESTRIL Take 1 tablet (2.5 mg total) by mouth 2 (two) times daily.   Potassium Chloride ER 20 MEQ Tbcr Take 20 mEq by mouth daily.   spironolactone 25 MG tablet Commonly known as:  ALDACTONE Take 0.5 tablets (12.5 mg total) by mouth daily.   warfarin 5 MG tablet Commonly known as:  COUMADIN Take 1 tablet (5 mg total) by mouth daily. What changed:  how much to take       Disposition and follow-up:   RichardZyshawn T Hubbard was discharged from [facility] in Stable condition.  At the hospital follow up visit please address:  1.  Please f/u INR. Patient discharged on 5 mg warfarin with INR of 2.44 (Goal 2.5 - 3.5 for mechanical mitral valve). Supra therapeutic to 7.7 on prior dose of 10 mg daily. Also, please recheck UA. Found to have microscopic hematuria on admission. If  persistent, may need further work-up. Lastly, please evaluate R upper extremity for resolution of hematoma.   2.  Labs / imaging needed at time of follow-up: f/u INR, UA for microscopic hematuria  3.  Pending labs/ test needing follow-up: None  Follow-up Appointments: Follow-up Information    [facility]. Schedule an appointment as soon as possible for a visit in 2 day(s).   Why:  INR follow up. Contact information: [address] [place] [address] [phone]          Hospital Course by problem list:  Supra therapeutic INR: Patient is on warfarin for a mechanical mitral valve replacement (goal 2.5 - 3.5). Dose was recently increased from 5 mg to 10 mg on 11/13/15. Last checked on 01/06/16 at which point INR was 3.2. Elevated on presentation to 7.76. He reported that he last took his dose of warfarin that morning and it was held on admission. He received one dose of po Vit K 2.5mg  that evening however INR remained elevated at 8.6 the following day. He was then given 2 units of FFP and 1mg  of IV Vit K with INR reduction to 1.86. Pharmacy was consulted for anticoagulation dosing. He was started on 5mg  warfarin with lovenox bridge and INR increased to 2.4 the next day. He was discharged on 5 mg daily with plans for recheck in 2 days. INR was  2.44 on discharge.  Normocytic Anemia: Hgb 8.6 on admission (baseline 12) and patient asymptomatic. No active source of bleeding identified at that time. Likely due to multiple ecchymosis in the setting of his supra therapeutic INR. However, patient developed R brachial hematoma after phelebotomy lab draw overnight and hbg dropped acutely to 6.4 the next morning. He was transfused 1 unit pRBCs with post transfusion hgb of 7.8. Hgb remained stable for the remainder of his stay (7-8) and no further transfusions were required.   RUE Hematoma: Overnight after admission patient developed R brachial hematoma after phlebotomy  lab draw. RUE became very swollen with overlying ecchymosis. R digits were cool to touch but motor function and sensation remained intact. Movement was limited 2/2 to pain. Radial and ulnar pulses were slightly diminished on that side. Orthopedic surgery was consulted for high risk of compartment syndrome. Swelling improve after a few days and surgery was not necessary.   Microscopic Hematuria: Positive HGB dipstick on UA, few bacteria but no other signs or symptoms of infection. Possibly 2/2 to mucosal bleed in the setting of supra therapeutic INR. Recheck UA after INR is back to therapeutic range at hospital follow up. If still present, consider outpatient work up for malignancy including CT abdomen pelvis.   Discharge Vitals:   BP (!) 97/54 (BP Location: Left Arm)   Pulse 85   Temp 98.4 F (36.9 C) (Oral)   Resp 18   Ht  (1.727 m)   Wt 167 lb (75.8 kg)   SpO2 100%   BMI 25.39 kg/m   Pertinent Labs, Studies, and Procedures:   Labs:   Ref. Range 01/29/2016 09:48 01/29/2016 18:48 01/30/2016 04:46 01/30/2016 15:08 01/31/2016 07:04 01/31/2016 15:31 02/01/2016 05:50  Prothrombin Time Latest Ref Range: 11.4 - 15.2 seconds 68.0 (H) 65.7 (H) 68.6 (H) 73.9 (H) 21.7 (H) 23.9 (H) 26.9 (H)  INR Unknown 7.76 (HH) 7.44 (HH) 7.85 (HH) 8.62 (HH) 1.86 2.10 2.44    Imaging:  01/14/16 Chest 2 View: IMPRESSION: 1. Low lung volumes without radiographic evidence of acute cardiopulmonary disease. 2. Postoperative changes and support apparatus, as above.  Discharge Instructions: Discharge Instructions    Call MD for:  difficulty breathing, headache or visual disturbances    Complete by:  As directed   Call MD for:  persistant dizziness or light-headedness    Complete by:  As directed   Call MD for:  persistant nausea and vomiting    Complete by:  As directed   Call MD for:  severe uncontrolled pain    Complete by:  As directed   Call MD for:  temperature >100.4    Complete by:  As directed   Diet -  low sodium heart healthy    Complete by:  As directed   Discharge instructions    Complete by:  As directed   Take  (1 tablet) of warfarin/coumadin daily. Follow up on Monday or Tuesday for an INR check and so that we can adjust your warfarin/coumadin dose. If you have any questions or concerns, call the clinic at [phone] or after hours you may call [phone] and ask for the internal medicine resident on call.   Increase activity slowly    Complete by:  As directed      Signed: Reymundo Poll, MD 02/03/2016, 5:16 PM   Pager: [phone]

## 2016-02-01 NOTE — Progress Notes (Signed)
ANTICOAGULATION CONSULT NOTE - Follow Up Consult  Pharmacy Consult for warfarin + enoxaparin Indication: MVR  No Known Allergies  Patient Measurements: Height: 5\' 8"  (172.7 cm) Weight: 167 lb (75.8 kg) IBW/kg (Calculated) : 68.4  Vital Signs: Temp: 98.4 F (36.9 C) (07/29 1000) Temp Source: Oral (07/29 1000) BP: 97/54 (07/29 1000) Pulse Rate: 85 (07/29 1000)  Labs:  Recent Labs  01/31/16 0704 01/31/16 1531 02/01/16 0550  HGB 6.4* 7.8* 7.2*  HCT 19.5* 25.6* 22.2*  PLT 269 313 285  LABPROT 21.7* 23.9* 26.9*  INR 1.86 2.10 2.44   Estimated Creatinine Clearance: 49.3 mL/min (by C-G formula based on SCr of 1.31 mg/dL).   Assessment: 70yo Male admitted with elevated INR (7.76) that continued to trend upward; s/p vit K. PTA warfarin dose 10 mg po daily.  Lovenox bridge to warfarin started on 7/28. INR 2.44 < 1.86 over last 24 hours following 5 mg x 1 dose of warfarin received last pm. Hgb 7.2 < 7.8 following transfusion of 1 unit PRBC. No evidence of bleeding and anemia felt to be due to hematoma.     Goal of Therapy:  INR goal 2.5-3.5 Monitor platelets by anticoagulation protocol: Yes   Plan:  1. Continue Lovenox 75 mg subcut q12h until INR therapeutic  2. Warfarin at 2.5mg  po x1  3. Monitor INR, CBC daily, clinical picture, s/sx bleeding/clotting, PO intake, DDI  Pollyann Samples, PharmD, BCPS 02/01/2016, 10:29 AM Pager: [phone]

## 2016-02-04 ENCOUNTER — Ambulatory Visit (INDEPENDENT_AMBULATORY_CARE_PROVIDER_SITE_OTHER): Payer: Medicare Other | Admitting: Pharmacist

## 2016-02-04 ENCOUNTER — Other Ambulatory Visit: Payer: Self-pay | Admitting: Internal Medicine

## 2016-02-04 DIAGNOSIS — Z952 Presence of prosthetic heart valve: Secondary | ICD-10-CM | POA: Diagnosis not present

## 2016-02-04 DIAGNOSIS — Z7901 Long term (current) use of anticoagulants: Secondary | ICD-10-CM

## 2016-02-04 DIAGNOSIS — I4892 Unspecified atrial flutter: Secondary | ICD-10-CM

## 2016-02-04 LAB — POCT INR: INR: 2.9

## 2016-02-04 NOTE — Progress Notes (Signed)
Anti-Coagulation Progress Note  [name] is a 70 y.o. male who is currently on an anti-coagulation regimen.    RECENT RESULTS: Recent results are below, the most recent result is correlated with a dose of 5mg  daily since discharge.  week: Lab Results  Component Value Date   INR 2.90 02/04/2016   INR 2.44 02/01/2016   INR 2.10 01/31/2016    ANTI-COAG DOSE: Anticoagulation Dose Instructions as of 02/04/2016      Glynis Smiles Tue Wed Thu Fri Sat   New Dose 5 mg 5 mg 5 mg 5 mg 5 mg 5 mg 5 mg    Description   Take only ONE tablet (5mg ) by mouth each day.       ANTICOAG SUMMARY: Anticoagulation Episode Summary    Current INR goal:   2.5-3.5  TTR:   68.6 % (3.4 y)  Next INR check:   02/24/2016  INR from last check:   2.90 (02/04/2016)  Weekly max dose:     Target end date:   Indefinite  INR check location:   [facility]  Preferred lab:     Send INR reminders to:   [facility] [address]   Indications   ATRIAL FLUTTER (Resolved) [I48.92] IRREGULAR PULSE [I49.9] Long term (current) use of anticoagulants (Resolved) [Z79.01]       Comments:         Anticoagulation Care Providers    Provider Role Specialty Phone number   [name], MD  Cardiology [phone]      ANTICOAG TODAY: Anticoagulation Summary  As of 02/04/2016   INR goal:   2.5-3.5  TTR:     Today's INR:   2.90  Next INR check:   02/24/2016  Target end date:   Indefinite   Indications   ATRIAL FLUTTER (Resolved) [I48.92] IRREGULAR PULSE [I49.9] Long term (current) use of anticoagulants (Resolved) [Z79.01]        Anticoagulation Episode Summary    INR check location:   [facility]   Preferred lab:      Send INR reminders to:   [facility] [address]   Comments:       Anticoagulation Care Providers    Provider Role Specialty Phone number   [name], MD  Cardiology [phone]      PATIENT INSTRUCTIONS: Patient Instructions  Patient instructed to take  medications as defined in the Anti-coagulation Track section of this encounter.  Patient instructed to take today's dose.  Patient verbalized understanding of these instructions.       FOLLOW-UP Return in 3 weeks (on 02/24/2016) for Follow up INR at 1100h.  Hulen Luster, III Pharm.D., CACP

## 2016-02-04 NOTE — Patient Instructions (Signed)
Patient instructed to take medications as defined in the Anti-coagulation Track section of this encounter.  Patient instructed to take today's dose.  Patient verbalized understanding of these instructions.    

## 2016-02-04 NOTE — Progress Notes (Signed)
INTERNAL MEDICINE TEACHING ATTENDING ADDENDUM - Heidemarie Goodnow M.D  Duration- indefinite, Indication- mechanical MVR, INR- therapeutic. Agree with pharmacy recommendations as outlined in their note.     

## 2016-02-04 NOTE — Progress Notes (Signed)
Anti-Coagulation Progress Note  [name] is a 70 y.o. male who is currently on an anti-coagulation regimen.    RECENT RESULTS: Recent results are below, the most recent result is correlated with a dose of 70 mg. per week: Lab Results  Component Value Date   INR 2.90 02/04/2016   INR 2.44 02/01/2016   INR 2.10 01/31/2016    ANTI-COAG DOSE: Anticoagulation Dose Instructions as of 02/04/2016      Glynis Smiles Tue Wed Thu Fri Sat   New Dose 10 mg 10 mg 10 mg 10 mg 10 mg 10 mg 10 mg       ANTICOAG SUMMARY: Anticoagulation Episode Summary    Current INR goal:   2.5-3.5  TTR:   68.6 % (3.4 y)  Next INR check:   02/24/2016  INR from last check:   2.90 (02/04/2016)  Weekly max dose:     Target end date:   Indefinite  [facility] check location[facility]   [facility]  Preferred lab:     Send INR reminders to:   [facility] [address]   Indications   ATRIAL FLUTTER (Resolved) [I48.92] IRREGULAR PULSE [I49.9] Long term (current) use of anticoagulants (Resolved) [Z79.01]       Comments:         Anticoagulation Care Providers    Provider Role Specialty Phone number   [name], MD  Cardiology [phone]      ANTICOAG TODAY: Anticoagulation Summary  As of 02/04/2016   INR goal:   2.5-3.5  TTR:     Today's INR:   2.90  Next INR check:   02/24/2016  Target end date:   Indefinite   Indications   ATRIAL FLUTTER (Resolved) [I48.92] IRREGULAR PULSE [I49.9] Long term (current) use of anticoagulants (Resolved) [Z79.01]        Anticoagulation Episode Summary    INR check location[facility]   [facility]   Preferred lab:      Send INR reminders to:   [facility] [address]   Comments:       Anticoagulation Care Providers    Provider Role Specialty Phone number   [name], MD  Cardiology [phone]      PATIENT INSTRUCTIONS: Patient Instructions  Patient instructed to take medications as defined in the Anti-coagulation Track section of this encounter.   Patient instructed to take today's dose.  Patient verbalized understanding of these instructions.       FOLLOW-UP Return in 3 weeks (on 02/24/2016) for Follow up INR at 1100h.  Hulen Luster, III Pharm.D., CACP

## 2016-02-10 ENCOUNTER — Emergency Department (HOSPITAL_COMMUNITY)
Admission: EM | Admit: 2016-02-10 | Discharge: 2016-02-10 | Disposition: A | Discharge status: Home / Self Care | Payer: Medicare Other | Attending: Emergency Medicine | Admitting: Emergency Medicine

## 2016-02-10 ENCOUNTER — Encounter (HOSPITAL_COMMUNITY): Payer: Self-pay | Admitting: Emergency Medicine

## 2016-02-10 DIAGNOSIS — R7989 Other specified abnormal findings of blood chemistry: Secondary | ICD-10-CM

## 2016-02-10 DIAGNOSIS — Z87891 Personal history of nicotine dependence: Secondary | ICD-10-CM | POA: Insufficient documentation

## 2016-02-10 DIAGNOSIS — W461XXS Contact with contaminated hypodermic needle, sequela: Secondary | ICD-10-CM | POA: Insufficient documentation

## 2016-02-10 DIAGNOSIS — Z950 Presence of cardiac pacemaker: Secondary | ICD-10-CM | POA: Diagnosis not present

## 2016-02-10 DIAGNOSIS — R748 Abnormal levels of other serum enzymes: Secondary | ICD-10-CM | POA: Diagnosis not present

## 2016-02-10 DIAGNOSIS — Z7901 Long term (current) use of anticoagulants: Secondary | ICD-10-CM | POA: Diagnosis not present

## 2016-02-10 DIAGNOSIS — I5022 Chronic systolic (congestive) heart failure: Secondary | ICD-10-CM | POA: Diagnosis not present

## 2016-02-10 DIAGNOSIS — M79621 Pain in right upper arm: Secondary | ICD-10-CM | POA: Diagnosis present

## 2016-02-10 DIAGNOSIS — S40021S Contusion of right upper arm, sequela: Secondary | ICD-10-CM

## 2016-02-10 DIAGNOSIS — I110 Hypertensive heart disease with heart failure: Secondary | ICD-10-CM | POA: Insufficient documentation

## 2016-02-10 LAB — COMPREHENSIVE METABOLIC PANEL
ALT: 23 U/L (ref 17–63)
AST: 59 U/L — ABNORMAL HIGH (ref 15–41)
Albumin: 4 g/dL (ref 3.5–5.0)
Alkaline Phosphatase: 46 U/L (ref 38–126)
Anion gap: 9 (ref 5–15)
BUN: 18 mg/dL (ref 6–20)
CHLORIDE: 104 mmol/L (ref 101–111)
CO2: 24 mmol/L (ref 22–32)
Calcium: 9.5 mg/dL (ref 8.9–10.3)
Creatinine, Ser: 1.3 mg/dL — ABNORMAL HIGH (ref 0.61–1.24)
GFR, EST NON AFRICAN AMERICAN: 53 mL/min — AB (ref >60)
Glucose, Bld: 107 mg/dL — ABNORMAL HIGH (ref 65–99)
POTASSIUM: 4.1 mmol/L (ref 3.5–5.1)
SODIUM: 137 mmol/L (ref 135–145)
Total Bilirubin: 0.9 mg/dL (ref 0.3–1.2)
Total Protein: 7.1 g/dL (ref 6.5–8.1)

## 2016-02-10 LAB — CBC
HEMATOCRIT: 30.8 % — AB (ref 39.0–52.0)
HEMOGLOBIN: 9.3 g/dL — AB (ref 13.0–17.0)
MCH: 28.5 pg (ref 26.0–34.0)
MCHC: 30.2 g/dL (ref 30.0–36.0)
MCV: 94.5 fL (ref 78.0–100.0)
Platelets: 406 10*3/uL — ABNORMAL HIGH (ref 150–400)
RBC: 3.26 MIL/uL — AB (ref 4.22–5.81)
RDW: 22.1 % — ABNORMAL HIGH (ref 11.5–15.5)
WBC: 5.8 10*3/uL (ref 4.0–10.5)

## 2016-02-10 LAB — PROTIME-INR
INR: 2.68
Prothrombin Time: 29 seconds — ABNORMAL HIGH (ref 11.4–15.2)

## 2016-02-10 NOTE — ED Provider Notes (Signed)
[facility]-EMERGENCY DEPT Provider Note   CSN: [id number] Arrival date & time: 02/10/16  1007  First Provider Contact:  First MD Initiated Contact with Patient 02/10/16 1101        History   Chief Complaint Chief Complaint  Patient presents with  . Arm Pain  . Abnormal Lab    HPI COYT ARNN is a 70 y.o. male.  Patient is a 70 year old male who is on Coumadin for a mechanical valve.  He was recently admitted on July 26 because his INR was elevated. During that admission he had a large right arm hematoma developed after a needlestick to the brachial area. He comes in today because he states he supposed to have his arm rechecked. He states the swelling is overall getting better. He denies any increased pain. No fevers. On further questioning, it was determined the patient had an appointment at 10:00 in a clinic to have his arm rechecked. He feels like he wasn't supposed to come to the emergency department. He denies any other recent illnesses or complaints.      Past Medical History:  Diagnosis Date  . Atrial fibrillation (HCC)    post op.  s/p dc-cv 04/2008. previously on amiodarone  . Atrial flutter (HCC)    atypical  . AV block, 1st degree    .450 msec--progressive  . 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  a- Echo 07/2008 Ef 55% mild peroprosthetic MVR with High transmitral gradient (mean 14).  b- normal coronaries by cath 12/2007  . BPH (benign prostatic hyperplasia)   . CHF (congestive heart failure) (HCC)    EF 35-40 % 2011 due to valvular disease and diastolic dysfunction  . Chronic back pain   . Dysphagia    with normal barium swallow 09/2008  . Endocarditis   . GERD (gastroesophageal reflux disease)   . Heavy alcohol use    history  . History of GI bleed   . Hypertension   . IV drug abuse    history of  . Lower GI bleed 01/2016  . Pacemaker 2010    Patient Active Problem List   Diagnosis Date Noted  . Supratherapeutic  INR   . Absolute anemia   . Hematoma of arm 01/31/2016  . Anemia 01/29/2016  . Memory impairment 12/24/2015  . H/O mitral valve replacement with mechanical valve 11/13/2015  . Benign prostatic hypertrophy 11/13/2015  . Chronic anticoagulation 11/13/2015  . Healthcare maintenance 11/13/2015  . GI AVM (gastrointestinal arteriovenous vascular malformation) 03/28/2014  . Lower GI bleed 03/23/2014  . Chronic systolic heart failure (HCC) 06/02/2011  . ENDOCARDITIS 03/13/2008  . IRREGULAR PULSE 03/13/2008    Past Surgical History:  Procedure Laterality Date  . CARDIAC VALVE REPLACEMENT     mitral valve, st jude mechanical   . COLONOSCOPY N/A 03/26/2014   Procedure: COLONOSCOPY;  Surgeon: Rachael Fee, MD;  Location: [facility] ENDOSCOPY;  Service: Endoscopy;  Laterality: N/A;  . ENTEROSCOPY N/A 03/28/2014   Procedure: ENTEROSCOPY;  Surgeon: Rachael Fee, MD;  Location: [facility] ENDOSCOPY;  Service: Endoscopy;  Laterality: N/A;  . ESOPHAGOGASTRODUODENOSCOPY N/A 03/25/2014   Procedure: ESOPHAGOGASTRODUODENOSCOPY (EGD);  Surgeon: Iva Boop, MD;  Location: [facility] ENDOSCOPY;  Service: Endoscopy;  Laterality: N/A;  . GIVENS CAPSULE STUDY N/A 03/26/2014   Procedure: GIVENS CAPSULE STUDY;  Surgeon: Rachael Fee, MD;  Location: [facility] ENDOSCOPY;  Service: Endoscopy;  Laterality: N/A;  . PACEMAKER INSERTION     inserted about 4-5  years ago       Home Medications    Prior to Admission medications   Medication Sig Start Date End Date Taking? Authorizing Provider  acetaminophen (TYLENOL) 500 MG tablet Take 1,000 mg by mouth every 6 (six) hours as needed for mild pain.   Yes Historical Provider, MD  carvedilol (COREG) 6.25 MG tablet TAKE 1 TABLET (6.25 MG TOTAL) BY MOUTH 2 (TWO) TIMES DAILY WITH A MEAL. 04/12/15  Yes Bevelyn Buckles Bensimhon, MD  digoxin (LANOXIN) 0.125 MG tablet TAKE 1 TABLET (0.125 MG TOTAL) BY MOUTH DAILY. 12/25/15  Yes Amy D Clegg, NP  lisinopril (PRINIVIL,ZESTRIL) 2.5 MG tablet Take 1 tablet  (2.5 mg total) by mouth 2 (two) times daily. 12/24/15  Yes Amy D Clegg, NP  potassium chloride 20 MEQ TBCR Take 20 mEq by mouth daily. 01/03/16  Yes Amy D Clegg, NP  spironolactone (ALDACTONE) 25 MG tablet Take 0.5 tablets (12.5 mg total) by mouth daily. 12/24/15  Yes Amy D Clegg, NP  warfarin (COUMADIN) 5 MG tablet Take 1 tablet (5 mg total) by mouth daily. 02/01/16  Yes Lora Paula, MD  finasteride (PROSCAR) 5 MG tablet Take 1 tablet (5 mg total) by mouth daily. Patient not taking: Reported on 02/10/2016 11/13/15 11/12/16  Selina Cooley, MD  furosemide (LASIX) 20 MG tablet Take 1 tablet (20 mg total) by mouth daily. Patient not taking: Reported on 02/10/2016 12/24/15   Sherald Hess, NP    Family History Family History  Problem Relation Age of Onset  . Coronary artery disease Mother     Social History Social History  Substance Use Topics  . Smoking status: Former Smoker    Years: 0.00    Types: Cigarettes  . Smokeless tobacco: Never Used  . Alcohol use No     Allergies   Review of patient's allergies indicates no known allergies.   Review of Systems Review of Systems  Constitutional: Negative for chills, diaphoresis, fatigue and fever.  HENT: Negative for congestion, rhinorrhea and sneezing.   Eyes: Negative.   Respiratory: Negative for cough, chest tightness and shortness of breath.   Cardiovascular: Negative for chest pain and leg swelling.  Gastrointestinal: Negative for abdominal pain, blood in stool, diarrhea, nausea and vomiting.  Genitourinary: Negative for difficulty urinating, flank pain, frequency and hematuria.  Musculoskeletal: Positive for myalgias. Negative for arthralgias and back pain.       Arm swelling  Skin: Negative for rash.  Neurological: Negative for dizziness, speech difficulty, weakness, numbness and headaches.     Physical Exam Updated Vital Signs BP 114/74 (BP Location: Left Arm)   Pulse 81   Temp 98.6 F (37 C) (Oral)   Resp 15   Ht   (1.727 m)   Wt 145 lb (65.8 kg)   SpO2 100%   BMI 22.05 kg/m   Physical Exam  Constitutional: He is oriented to person, place, and time. He appears well-developed and well-nourished.  HENT:  Head: Normocephalic and atraumatic.  Eyes: Pupils are equal, round, and reactive to light.  Neck: Normal range of motion. Neck supple.  Cardiovascular: Normal rate, regular rhythm and normal heart sounds.   Pulmonary/Chest: Effort normal and breath sounds normal. No respiratory distress. He has no wheezes. He has no rales. He exhibits no tenderness.  Abdominal: Soft. Bowel sounds are normal. There is no tenderness. There is no rebound and no guarding.  Musculoskeletal: Normal range of motion. He exhibits no edema.  Patient has ecchymosis and swelling to his  right arm from the elbow down to the hand. There is no significant warmth or erythema. It's nontender on exam. He has normal sensation in the hand. Radial pulses are intact.  Lymphadenopathy:    He has no cervical adenopathy.  Neurological: He is alert and oriented to person, place, and time.  Skin: Skin is warm and dry. No rash noted.  Psychiatric: He has a normal mood and affect.     ED Treatments / Results  Labs (all labs ordered are listed, but only abnormal results are displayed) Labs Reviewed  COMPREHENSIVE METABOLIC PANEL - Abnormal; Notable for the following:       Result Value   Glucose, Bld 107 (*)    Creatinine, Ser 1.30 (*)    AST 59 (*)    GFR calc non Af Amer 53 (*)    All other components within normal limits  CBC - Abnormal; Notable for the following:    RBC 3.26 (*)    Hemoglobin 9.3 (*)    HCT 30.8 (*)    RDW 22.1 (*)    Platelets 406 (*)    All other components within normal limits  PROTIME-INR - Abnormal; Notable for the following:    Prothrombin Time 29.0 (*)    All other components within normal limits    EKG  EKG Interpretation None       Radiology No results found.  Procedures Procedures  (including critical care time)  Medications Ordered in ED Medications - No data to display   Initial Impression / Assessment and Plan / ED Course  I have reviewed the triage vital signs and the nursing notes.  Pertinent labs & imaging results that were available during my care of the patient were reviewed by me and considered in my medical decision making (see chart for details).  Clinical Course    We did contact the internal medicine clinic and the heart clinic and patient doesn't have any apparent appointments today at 10:00. It's unclear where he was supposed to go. However since he was here, his arm was checked. I checked his INR which is therapeutic. His hemoglobin is stable. His arm appears to be slowly improving. There is no signs of infection. No signs of compartment syndrome. His creatinine is similar to his baseline values. He was discharged home in good condition. He was encouraged to follow-up with the internal medicine clinic as needed.  Final Clinical Impressions(s) / ED Diagnoses   Final diagnoses:  Hematoma of arm, right, sequela  Elevated serum creatinine    New Prescriptions New Prescriptions   No medications on file     Rolan Bucco, MD 02/10/16 1328

## 2016-02-10 NOTE — ED Notes (Signed)
ED at bedside.  

## 2016-02-10 NOTE — ED Triage Notes (Signed)
Pt reports here for follow up of swelling and pain to right arm and low hemoglobin. Pt reports he was admitted to hospital recently.

## 2016-02-24 ENCOUNTER — Ambulatory Visit (INDEPENDENT_AMBULATORY_CARE_PROVIDER_SITE_OTHER): Payer: Medicare Other | Admitting: Pharmacist

## 2016-02-24 DIAGNOSIS — Z7901 Long term (current) use of anticoagulants: Secondary | ICD-10-CM

## 2016-02-24 DIAGNOSIS — I4892 Unspecified atrial flutter: Secondary | ICD-10-CM

## 2016-02-24 LAB — POCT INR: INR: 1.7

## 2016-02-24 NOTE — Patient Instructions (Signed)
Patient instructed to take medications as defined in the Anti-coagulation Track section of this encounter.  Patient instructed to take today's dose.  Patient verbalized understanding of these instructions.    

## 2016-02-24 NOTE — Progress Notes (Signed)
Anti-Coagulation Progress Note  [name] is a 70 y.o. male who is currently on an anti-coagulation regimen.    RECENT RESULTS: Recent results are below, the most recent result is correlated with a dose of 35 mg. per week: Lab Results  Component Value Date   INR 1.70 02/24/2016   INR 2.68 02/10/2016   INR 2.90 02/04/2016    ANTI-COAG DOSE: Anticoagulation Dose Instructions as of 02/24/2016      Glynis Smiles Tue Wed Thu Fri Sat   New Dose 5 mg 7.5 mg 5 mg 5 mg 7.5 mg 5 mg 5 mg    Description          ANTICOAG SUMMARY: Anticoagulation Episode Summary    Current INR goal:   2.5-3.5  TTR:   68.2 % (3.4 y)  Next INR check:   03/02/2016  INR from last check:   1.70! (02/24/2016)  Weekly max dose:     Target end date:   Indefinite  INR check location:   [facility]  Preferred lab:     Send INR reminders to:   [facility] [address]   Indications   ATRIAL FLUTTER (Resolved) [I48.92] IRREGULAR PULSE [I49.9] Long term (current) use of anticoagulants (Resolved) [Z79.01]       Comments:         Anticoagulation Care Providers    Provider Role Specialty Phone number   [name], MD  Cardiology [phone]      ANTICOAG TODAY: Anticoagulation Summary  As of 02/24/2016   INR goal:   2.5-3.5  TTR:     Today's INR:   1.70!  Next INR check:   03/02/2016  Target end date:   Indefinite   Indications   ATRIAL FLUTTER (Resolved) [I48.92] IRREGULAR PULSE [I49.9] Long term (current) use of anticoagulants (Resolved) [Z79.01]        Anticoagulation Episode Summary    INR check location:   [facility]   Preferred lab:      Send INR reminders to:   [facility] [address]   Comments:       Anticoagulation Care Providers    Provider Role Specialty Phone number   [name], MD  Cardiology [phone]      PATIENT INSTRUCTIONS: There are no Patient Instructions on file for this visit.   FOLLOW-UP Return in about 7 days (around  03/02/2016) for Follow up INR at 1115h.  Hulen Luster, III Pharm.D., CACP

## 2016-03-02 ENCOUNTER — Ambulatory Visit (INDEPENDENT_AMBULATORY_CARE_PROVIDER_SITE_OTHER): Payer: Medicare Other | Admitting: Pharmacist

## 2016-03-02 DIAGNOSIS — Z7901 Long term (current) use of anticoagulants: Secondary | ICD-10-CM

## 2016-03-02 DIAGNOSIS — I4892 Unspecified atrial flutter: Secondary | ICD-10-CM

## 2016-03-02 LAB — POCT INR: INR: 2.8

## 2016-03-02 NOTE — Progress Notes (Signed)
Anti-Coagulation Progress Note  AZEEZ SECK is a 70 y.o. male who is currently on an anti-coagulation regimen.    RECENT RESULTS: Recent results are below, the most recent result is correlated with a dose of 40 mg. per week: Lab Results  Component Value Date   INR 2.80 03/02/2016   INR 1.70 02/24/2016   INR 2.68 02/10/2016    ANTI-COAG DOSE: Anticoagulation Dose Instructions as of 03/02/2016      Glynis Smiles Tue Wed Thu Fri Sat   New Dose 5 mg 7.5 mg 5 mg 5 mg 7.5 mg 5 mg 5 mg    Description          ANTICOAG SUMMARY: Anticoagulation Episode Summary    Current INR goal:   2.5-3.5  TTR:   67.9 % (3.5 y)  Next INR check:   03/23/2016  INR from last check:   2.80 (03/02/2016)  Weekly max dose:     Target end date:   Indefinite  INR check location:   [facility]  Preferred lab:     Send INR reminders to:   [facility] [address]   Indications   ATRIAL FLUTTER (Resolved) [I48.92] IRREGULAR PULSE [I49.9] Long term (current) use of anticoagulants (Resolved) [Z79.01]       Comments:         Anticoagulation Care Providers    Provider Role Specialty Phone number   [name], MD  Cardiology [phone]      ANTICOAG TODAY: Anticoagulation Summary  As of 03/02/2016   INR goal:   2.5-3.5  TTR:     Today's INR:   2.80  Next INR check:   03/23/2016  Target end date:   Indefinite   Indications   ATRIAL FLUTTER (Resolved) [I48.92] IRREGULAR PULSE [I49.9] Long term (current) use of anticoagulants (Resolved) [Z79.01]        Anticoagulation Episode Summary    INR check location:   [facility]   Preferred lab:      Send INR reminders to:   [facility] [address]   Comments:       Anticoagulation Care Providers    Provider Role Specialty Phone number   [name], MD  Cardiology [phone]      PATIENT INSTRUCTIONS: There are no Patient Instructions on file for this visit.   FOLLOW-UP Return in 3 weeks (on 03/23/2016) for  Follow up INR at 1100h.  Hulen Luster, III Pharm.D., CACP

## 2016-03-02 NOTE — Patient Instructions (Signed)
Patient instructed to take medications as defined in the Anti-coagulation Track section of this encounter.  Patient instructed to take today's dose.  Patient verbalized understanding of these instructions.    

## 2016-03-17 ENCOUNTER — Encounter (HOSPITAL_COMMUNITY): Payer: Medicare Other

## 2016-03-23 ENCOUNTER — Ambulatory Visit: Payer: Medicare Other

## 2016-03-23 NOTE — Progress Notes (Signed)
Patient ID: [name], male   DOB: 1946/06/28, 70 y.o.   MRN: [id number]     Advanced Heart Failure Clinic Note    Primary Cardiologist: Dr. Gala RomneyBensimhon Internal Medicine: Dr Earnest ConroyFlores EP: Dr Graciela HusbandsKlein   HPI: [name] is a 70 year old male with h/o polysubstance abuse c/b MV endocarditis s/p St. Jude MVR, CHF due to NICM and PAF.  Underwent CRT-P implant for bradycardia and fatigue.   He presents today for regular follow up.  Down 6 lbs from last visit. [place] from [place]. Occasionally gets SOB on bike, especially up hills. Having occasional dizziness with rapid standing, but not marked or limited. No SOB on flat ground, changing clothes, or bathing.  Taking all medications as directed. Appetite good. No BRPBR or Melena. Has appointment with Dr. Earnest ConroyFlores this week. Hasn't seen him yet about memory problems.   Labs (1/15): K 3.9, creatinine 1.56, hemoglobin 11 Labs (01/14/2015) K 3.2 Creatinine 1.55 BNP 412 Labs (01/30/15): k 3.8 Creatinine 1.47  Labs (8/16): K 4, creatinine 1.55, digoxin 0.6 Labs (9/16) K 3.4, creatinine 1.48 Labs (09/24/2015) K 3.7 Creatinine 1.24   TTE in 2010 showed normal EF with elevated gradient across MV (mean 14) with perivalvular leak. Follow-up TEE in June 2010 with EF 40-45% mild stenosis mean gradient = 8. No vegetation or abscess.   Echo 8/12 EF 35% mild perivalvular MR.  Echo 3/13 EF 50% mild MVR stable with mild peri-valvular leak.  ECHO 12/16/2012 EF 50% stable MVR ECHO 12/2014 - EF 25-30%  Grade I DD, mechanical mitral valve with mean gradient 6 mmHg, no significant MR.  ECHO 09/2015- EF 35-40%. Grade I DD Mitral Valve ok no stenosis  SH: Lives with his wife. No ETOH, tobacco abuse or drugs.  FH: Mom deceased: no cardiac issues        Father deceased: no cardiac issues.   ROS: All systems negative except as listed in HPI, PMH and Problem List.  Past Medical History:  Diagnosis Date  . Atrial fibrillation (HCC)    post op.   s/p dc-cv 04/2008. previously on amiodarone  . Atrial flutter (HCC)    atypical  . AV block, 1st degree    .450 msec--progressive  . 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  a- Echo 07/2008 Ef 55% mild peroprosthetic MVR with High transmitral gradient (mean 14).  b- normal coronaries by cath 12/2007  . BPH (benign prostatic hyperplasia)   . CHF (congestive heart failure) (HCC)    EF 35-40 % 2011 due to valvular disease and diastolic dysfunction  . Chronic back pain   . Dysphagia    with normal barium swallow 09/2008  . Endocarditis   . GERD (gastroesophageal reflux disease)   . Heavy alcohol use    history  . History of GI bleed   . Hypertension   . IV drug abuse    history of  . Lower GI bleed 01/2016  . Pacemaker 2010    Current Outpatient Prescriptions  Medication Sig Dispense Refill  . acetaminophen (TYLENOL) 500 MG tablet Take 1,000 mg by mouth every 6 (six) hours as needed for mild pain.    . carvedilol (COREG) 6.25 MG tablet TAKE 1 TABLET (6.25 MG TOTAL) BY MOUTH 2 (TWO) TIMES DAILY WITH A MEAL. 60 tablet 6  . digoxin (LANOXIN) 0.125 MG tablet TAKE 1 TABLET (0.125 MG TOTAL) BY MOUTH DAILY. 90 tablet 3  . furosemide (LASIX)  20 MG tablet Take 1 tablet (20 mg total) by mouth daily. 30 tablet 6  . lisinopril (PRINIVIL,ZESTRIL) 2.5 MG tablet Take 1 tablet (2.5 mg total) by mouth 2 (two) times daily. 60 tablet 6  . potassium chloride 20 MEQ TBCR Take 20 mEq by mouth daily. 30 tablet 3  . spironolactone (ALDACTONE) 25 MG tablet Take 25 mg by mouth daily.    Marland Kitchen warfarin (COUMADIN) 5 MG tablet Take 1 tablet (5 mg total) by mouth daily. 30 tablet 0   No current facility-administered medications for this encounter.     Vitals:   03/24/16 1158  BP: (!) 96/50  Pulse: 67  SpO2: 98%  Weight: 166 lb 6.4 oz (75.5 kg)   Wt Readings from Last 3 Encounters:  03/24/16 166 lb 6.4 oz (75.5 kg)  02/10/16 145 lb (65.8 kg)  01/31/16 167 lb (75.8 kg)      PHYSICAL EXAM: Gen: Elderly appearing. Walked into clinic with difficulty. HEENT: normal Neck: supple. no JVD. Carotids 2+ bilat; no bruits.  Cor:  RRR. No rubs, gallops. mechanical s1. No murmur appreciated Lungs: Clear, normal effort Abdomen: soft, NT, ND, no HSM. No bruits or masses. +BS  Extremities: no cyanosis, clubbing, rash. No ankle edema.   Neuro: alert & orientedx3, cranial nerves grossly intact. moves all 4 extremities w/o difficulty. affect pleasant  ASSESSMENT & PLAN: 1. Chronic systolic HF: Nonischemic cardiomyopathy s/p St Jude  CRT-D.  March 2017 EF improved 35-40%.  NYHA class II symptoms. - Volume status looks good on exam.  - Continue lasix 20 mg daily. BMET/BNP/Digoxin today today.   - Continue Coreg 6.25 mg bid.  - Continue digoxin 0.125 mg daily  - Continue lisinopril 2.5 mg BID. BP too soft for Entresto, especially given intermittent dizziness.  - Continue 25 mg spiro daily 2. Mechanical mitral valve: Valve ok on ECHO. On coumadin + aspirin 81 mg daily.   Goal INR 2.5-3.5.   INR followed at the coumadin clinic. - CBC today.  3. GI Bleed: 03/2014,  - no BRBPR/melena. Continue PPI.  4. HTN - Stable to low.  Asked to call with worsening dizziness or lightheadedness.  5. Memory Impairment - Sees PCP this week. Will make note for him and wife to ask PCP about further work up for this.   6. BPH - Off finasteride. UO good.  Per PCP.   BMET/BNP/CBC/Digoxin today. Follow up 3 months with Dr. Gala Romney.  Mariam Dollar Julie Paolini PA-C  03/24/2016

## 2016-03-24 ENCOUNTER — Ambulatory Visit (HOSPITAL_COMMUNITY)
Admission: RE | Admit: 2016-03-24 | Discharge: 2016-03-24 | Disposition: A | Discharge status: Home / Self Care | Payer: Medicare Other | Source: Ambulatory Visit | Attending: Cardiology | Admitting: Cardiology

## 2016-03-24 VITALS — BP 96/50 | HR 67 | Wt 166.4 lb

## 2016-03-24 DIAGNOSIS — I480 Paroxysmal atrial fibrillation: Secondary | ICD-10-CM | POA: Insufficient documentation

## 2016-03-24 DIAGNOSIS — Z952 Presence of prosthetic heart valve: Secondary | ICD-10-CM | POA: Diagnosis not present

## 2016-03-24 DIAGNOSIS — I5022 Chronic systolic (congestive) heart failure: Secondary | ICD-10-CM | POA: Diagnosis not present

## 2016-03-24 DIAGNOSIS — I4892 Unspecified atrial flutter: Secondary | ICD-10-CM | POA: Insufficient documentation

## 2016-03-24 DIAGNOSIS — R413 Other amnesia: Secondary | ICD-10-CM

## 2016-03-24 DIAGNOSIS — N400 Enlarged prostate without lower urinary tract symptoms: Secondary | ICD-10-CM | POA: Insufficient documentation

## 2016-03-24 DIAGNOSIS — Z7901 Long term (current) use of anticoagulants: Secondary | ICD-10-CM | POA: Insufficient documentation

## 2016-03-24 DIAGNOSIS — R42 Dizziness and giddiness: Secondary | ICD-10-CM | POA: Diagnosis not present

## 2016-03-24 DIAGNOSIS — Z950 Presence of cardiac pacemaker: Secondary | ICD-10-CM | POA: Insufficient documentation

## 2016-03-24 DIAGNOSIS — I4430 Unspecified atrioventricular block: Secondary | ICD-10-CM | POA: Diagnosis not present

## 2016-03-24 DIAGNOSIS — D649 Anemia, unspecified: Secondary | ICD-10-CM

## 2016-03-24 DIAGNOSIS — Z7982 Long term (current) use of aspirin: Secondary | ICD-10-CM | POA: Diagnosis not present

## 2016-03-24 DIAGNOSIS — I110 Hypertensive heart disease with heart failure: Secondary | ICD-10-CM | POA: Insufficient documentation

## 2016-03-24 DIAGNOSIS — I429 Cardiomyopathy, unspecified: Secondary | ICD-10-CM | POA: Insufficient documentation

## 2016-03-24 DIAGNOSIS — K219 Gastro-esophageal reflux disease without esophagitis: Secondary | ICD-10-CM | POA: Insufficient documentation

## 2016-03-24 DIAGNOSIS — Z8719 Personal history of other diseases of the digestive system: Secondary | ICD-10-CM | POA: Diagnosis not present

## 2016-03-24 LAB — BASIC METABOLIC PANEL
Anion gap: 8 (ref 5–15)
BUN: 34 mg/dL — ABNORMAL HIGH (ref 6–20)
CALCIUM: 8.9 mg/dL (ref 8.9–10.3)
CO2: 26 mmol/L (ref 22–32)
CREATININE: 1.8 mg/dL — AB (ref 0.61–1.24)
Chloride: 102 mmol/L (ref 101–111)
GFR calc non Af Amer: 36 mL/min — ABNORMAL LOW (ref >60)
GFR, EST AFRICAN AMERICAN: 42 mL/min — AB (ref >60)
Glucose, Bld: 89 mg/dL (ref 65–99)
Potassium: 4.2 mmol/L (ref 3.5–5.1)
SODIUM: 136 mmol/L (ref 135–145)

## 2016-03-24 LAB — CBC
HCT: 36.7 % — ABNORMAL LOW (ref 39.0–52.0)
Hemoglobin: 11.5 g/dL — ABNORMAL LOW (ref 13.0–17.0)
MCH: 28.4 pg (ref 26.0–34.0)
MCHC: 31.3 g/dL (ref 30.0–36.0)
MCV: 90.6 fL (ref 78.0–100.0)
PLATELETS: 317 10*3/uL (ref 150–400)
RBC: 4.05 MIL/uL — ABNORMAL LOW (ref 4.22–5.81)
RDW: 15.1 % (ref 11.5–15.5)
WBC: 4.5 10*3/uL (ref 4.0–10.5)

## 2016-03-24 LAB — DIGOXIN LEVEL: DIGOXIN LVL: 0.6 ng/mL — AB (ref 0.8–2.0)

## 2016-03-24 LAB — BRAIN NATRIURETIC PEPTIDE: B NATRIURETIC PEPTIDE 5: 26.5 pg/mL (ref 0.0–100.0)

## 2016-03-24 MED ORDER — SPIRONOLACTONE 25 MG PO TABS: 25.0000 mg | ORAL_TABLET | Freq: Every day | ORAL | 6 refills | Status: DC

## 2016-03-24 MED ORDER — CARVEDILOL 6.25 MG PO TABS: ORAL_TABLET | ORAL | 6 refills | Status: DC

## 2016-03-24 NOTE — Patient Instructions (Signed)
Labs today  Be sure to follow up with your PCP regarding your memory loss.   Your physician recommends that you schedule a follow-up appointment in: 3 months with Dr Gala Romney  Do the following things EVERYDAY: 1) Weigh yourself in the morning before breakfast. Write it down and keep it in a log. 2) Take your medicines as prescribed 3) Eat low salt foods-Limit salt (sodium) to 2000 mg per day.  4) Stay as active as you can everyday 5) Limit all fluids for the day to less than 2 liters

## 2016-03-25 ENCOUNTER — Telehealth: Payer: Self-pay | Admitting: Internal Medicine

## 2016-03-25 NOTE — Telephone Encounter (Signed)
APT. REMINDER CALL, LMTCB °

## 2016-03-26 ENCOUNTER — Encounter: Payer: Self-pay | Admitting: Internal Medicine

## 2016-03-26 ENCOUNTER — Ambulatory Visit (INDEPENDENT_AMBULATORY_CARE_PROVIDER_SITE_OTHER): Payer: Medicare Other | Admitting: Internal Medicine

## 2016-03-26 VITALS — BP 112/55 | HR 82 | Temp 98.6°F | Wt 165.5 lb

## 2016-03-26 DIAGNOSIS — Z7289 Other problems related to lifestyle: Secondary | ICD-10-CM | POA: Diagnosis not present

## 2016-03-26 DIAGNOSIS — I5022 Chronic systolic (congestive) heart failure: Secondary | ICD-10-CM

## 2016-03-26 DIAGNOSIS — Z7251 High risk heterosexual behavior: Secondary | ICD-10-CM | POA: Diagnosis not present

## 2016-03-26 DIAGNOSIS — Z23 Encounter for immunization: Secondary | ICD-10-CM | POA: Diagnosis not present

## 2016-03-26 DIAGNOSIS — N1831 Chronic kidney disease, stage 3a: Secondary | ICD-10-CM | POA: Insufficient documentation

## 2016-03-26 DIAGNOSIS — R740 Nonspecific elevation of levels of transaminase and lactic acid dehydrogenase [LDH]: Secondary | ICD-10-CM

## 2016-03-26 DIAGNOSIS — N189 Chronic kidney disease, unspecified: Secondary | ICD-10-CM | POA: Insufficient documentation

## 2016-03-26 DIAGNOSIS — Z114 Encounter for screening for human immunodeficiency virus [HIV]: Secondary | ICD-10-CM | POA: Diagnosis not present

## 2016-03-26 DIAGNOSIS — N400 Enlarged prostate without lower urinary tract symptoms: Secondary | ICD-10-CM

## 2016-03-26 DIAGNOSIS — Z87891 Personal history of nicotine dependence: Secondary | ICD-10-CM

## 2016-03-26 DIAGNOSIS — R7401 Elevation of levels of liver transaminase levels: Secondary | ICD-10-CM

## 2016-03-26 DIAGNOSIS — R413 Other amnesia: Secondary | ICD-10-CM | POA: Diagnosis not present

## 2016-03-26 DIAGNOSIS — F1021 Alcohol dependence, in remission: Secondary | ICD-10-CM | POA: Diagnosis not present

## 2016-03-26 DIAGNOSIS — N182 Chronic kidney disease, stage 2 (mild): Secondary | ICD-10-CM

## 2016-03-26 DIAGNOSIS — N1832 Chronic kidney disease, stage 3b: Secondary | ICD-10-CM | POA: Insufficient documentation

## 2016-03-26 NOTE — Patient Instructions (Addendum)
Thank you for coming to see me today. It was a pleasure. Today we talked about:   Memory Loss: - we are checking some labs to rule out things that can be reversed and will let you know if anything is abnormal  Please follow-up with me in 3 months to reassess your memory loss.  If you have any questions or concerns, please do not hesitate to call the office at [phone].  Take Care,   [name], DO

## 2016-03-26 NOTE — Assessment & Plan Note (Addendum)
A: Patient seen in HF clinic earlier this week.  Raised concern for memory impairment.  States for about 2 months he is having difficulty remembering things like addresses or phone numbers.  He has never gotten lost driving home.  He will also forget parts of conversations shortly afterwards.  He also states if he were to, for example, go to 3 different stores while running errands, he may forget one of the stores.  His B12 was normal in 2010, TSH normal in 2016, HIV negative in 2010.  He has had alcohol in 10-12 years but used to be a heavy drinker. PHQ-9 today was 2 and he denied any depression symptoms.  He has not noticed any other neurologic impairments other than his memory.  These symptoms have been noticed by him and his wife although she is not present.  He scored a 4 today on Mini-Cog examination.  P: - TSH normal - Vitamin B12 normal - RPR non reactive - HIV non reactive - Follow PHQ-9 scoring - Follow up in 3 months.  Consider non-contrast CT or MRI with no obvious reversible cause identified and if there is continued or worsening symptoms

## 2016-03-26 NOTE — Progress Notes (Signed)
CC: here today for f/u of his HFrEF, mechanical MV and complaint of memory impairment.  HPI:  [name] is a 70 y.o. man with a past medical history listed below here today for follow up of his chronic HFrEF, mechanical MV, and complaint of memory impairment.   For details of today's visit and the status of his chronic medical issues please refer to the assessment and plan.   Past Medical History:  Diagnosis Date  . Atrial fibrillation (HCC)    post op.  s/p dc-cv 04/2008. previously on amiodarone  . Atrial flutter (HCC)    atypical  . AV block, 1st degree    .450 msec--progressive  . 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  a- Echo 07/2008 Ef 55% mild peroprosthetic MVR with High transmitral gradient (mean 14).  b- normal coronaries by cath 12/2007  . BPH (benign prostatic hyperplasia)   . CHF (congestive heart failure) (HCC)    EF 35-40 % 2011 due to valvular disease and diastolic dysfunction  . Chronic back pain   . Dysphagia    with normal barium swallow 09/2008  . Endocarditis   . GERD (gastroesophageal reflux disease)   . Heavy alcohol use    history  . History of GI bleed   . Hypertension   . IV drug abuse    history of  . Lower GI bleed 01/2016  . Pacemaker 2010    Review of Systems:  Please see pertinent ROS reviewed in HPI and problem based charting.   Physical Exam:  Vitals:   03/26/16 1411  BP: (!) 112/55  Pulse: 82  Temp: 98.6 F (37 C)  TempSrc: Oral  SpO2: 99%  Weight: 165 lb 8 oz (75.1 kg)   Physical Exam  Constitutional: He is oriented to person, place, and time and well-developed, well-nourished, and in no distress.  HENT:  Head: Normocephalic and atraumatic.  Cardiovascular: Normal rate and regular rhythm.   mechanical s1 noted  Musculoskeletal:  No cogwheel rigidity  Neurological: He is alert and oriented to person, place, and time. No cranial nerve deficit. Coordination normal.  Psychiatric:  Mood and affect normal.     Assessment & Plan:   See Encounters Tab for problem based charting.  Patient discussed with Dr. Cleda DaubE. Hoffman   Memory impairment A: Patient seen in HF clinic earlier this week.  Raised concern for memory impairment.  States for about 2 months he is having difficulty remembering things like addresses or phone numbers.  He has never gotten lost driving home.  He will also forget parts of conversations shortly afterwards.  He also states if he were to, for example, go to 3 different stores while running errands, he may forget one of the stores.  His B12 was normal in 2010, TSH normal in 2016, HIV negative in 2010.  He has had alcohol in 10-12 years but used to be a heavy drinker. PHQ-9 today was 2 and he denied any depression symptoms.  He has not noticed any other neurologic impairments other than his memory.  These symptoms have been noticed by him and his wife although she is not present.  He scored a 4 today on Mini-Cog examination.  P: - TSH normal - Vitamin B12 normal - RPR non reactive - HIV non reactive - Follow PHQ-9 scoring - Follow up in 3 months.  Consider non-contrast CT or MRI with no obvious reversible cause identified and if there is  continued or worsening symptoms  Benign prostatic hypertrophy A: He has no urinary symptom complaints today.  His finasteride was discontinued on 03/24/2016 due to patient not taking within the last 30 days.  P: - continue monitoring for symptoms - if finasteride to be restarted in the future, could consider dropping Lasix to an as-needed basis as he has been euvolemic on examination with stable weights.  Chronic systolic heart failure (HCC) A: He was seen this week in the heart failure clinic and his current regimen was continued.  He appears euvolemic on exam today with stable weight.  He has NYHA class II symptoms with SOB while riding his bike but otherwise has no difficulty.  No CP, orthopnea, or PND.  Given his  slight rise in serum creatinine above baseline of what appears to be 1.4-1.5, I wonder if we could consider changing his Lasix to an as needed basis in case he is becoming more hypovolemic.  I will defer this management to his heart failure specialists.  P: - follow up in 3 months - continuing Lasix 20mg  daily, Coreg 6.25mg  BID, Digoxin 0.125mg  daily, Lisinopril 2.5mg  BID, and Spironolactone 25mg  daily  Elevated AST (SGOT) A: Chronically low-level elevated AST dating back to 2009.  Has had negative hepatitis serologies in 2015.  Abdominal US in 2015 also noted no obvious sonographic changes of cirrhosis involving the liver and no worrisome hepatic lesions or intrahepatic biliary dilatation.  Previous history of heavy EtOH use may explain the elevation.  Not volume overloaded to suggest congestion.  Serum CK checked this visit was normal to exclude a muscle disorder.  TSH normal.  No GI symptoms to suggest celiac and would not suspect an autoimmune source at this point given his age.  Serum transferrin saturation less than 45% with mildly elevated ferritin does not suggest hemochromatosis.  P: - observe for now.

## 2016-03-27 DIAGNOSIS — R740 Nonspecific elevation of levels of transaminase and lactic acid dehydrogenase [LDH]: Secondary | ICD-10-CM

## 2016-03-27 DIAGNOSIS — R7401 Elevation of levels of liver transaminase levels: Secondary | ICD-10-CM | POA: Insufficient documentation

## 2016-03-27 LAB — CK: Total CK: 164 U/L (ref 24–204)

## 2016-03-27 LAB — TSH: TSH: 1.06 u[IU]/mL (ref 0.450–4.500)

## 2016-03-27 LAB — HIV ANTIBODY (ROUTINE TESTING W REFLEX): HIV Screen 4th Generation wRfx: NONREACTIVE

## 2016-03-27 LAB — VITAMIN B12: VITAMIN B 12: 625 pg/mL (ref 211–946)

## 2016-03-27 LAB — SYPHILIS: RPR W/REFLEX TO RPR TITER AND TREPONEMAL ANTIBODIES, TRADITIONAL SCREENING AND DIAGNOSIS ALGORITHM: RPR Ser Ql: NONREACTIVE

## 2016-03-27 NOTE — Assessment & Plan Note (Signed)
A: He was seen this week in the heart failure clinic and his current regimen was continued.  He appears euvolemic on exam today with stable weight.  He has NYHA class II symptoms with SOB while riding his bike but otherwise has no difficulty.  No CP, orthopnea, or PND.  Given his slight rise in serum creatinine above baseline of what appears to be 1.4-1.5, I wonder if we could consider changing his Lasix to an as needed basis in case he is becoming more hypovolemic.  I will defer this management to his heart failure specialists.  P: - follow up in 3 months - continuing Lasix 20mg  daily, Coreg 6.25mg  BID, Digoxin 0.125mg  daily, Lisinopril 2.5mg  BID, and Spironolactone 25mg  daily

## 2016-03-27 NOTE — Assessment & Plan Note (Signed)
A: Chronically low-level elevated AST dating back to 2009.  Has had negative hepatitis serologies in 2015.  Abdominal US in 2015 also noted no obvious sonographic changes of cirrhosis involving the liver and no worrisome hepatic lesions or intrahepatic biliary dilatation.  Previous history of heavy EtOH use may explain the elevation.  Not volume overloaded to suggest congestion.  Serum CK checked this visit was normal to exclude a muscle disorder.  TSH normal.  No GI symptoms to suggest celiac and would not suspect an autoimmune source at this point given his age.  Serum transferrin saturation less than 45% with mildly elevated ferritin does not suggest hemochromatosis.  P: - observe for now.

## 2016-03-27 NOTE — Assessment & Plan Note (Signed)
A: He has no urinary symptom complaints today.  His finasteride was discontinued on 03/24/2016 due to patient not taking within the last 30 days.  P: - continue monitoring for symptoms - if finasteride to be restarted in the future, could consider dropping Lasix to an as-needed basis as he has been euvolemic on examination with stable weights.

## 2016-03-27 NOTE — Progress Notes (Signed)
Internal Medicine Clinic Attending  Case discussed with Dr. Wallace at the time of the visit.  We reviewed the resident's history and exam and pertinent patient test results.  I agree with the assessment, diagnosis, and plan of care documented in the resident's note.  

## 2016-03-30 ENCOUNTER — Telehealth (HOSPITAL_COMMUNITY): Payer: Self-pay | Admitting: Cardiology

## 2016-03-30 DIAGNOSIS — I509 Heart failure, unspecified: Secondary | ICD-10-CM

## 2016-03-30 MED ORDER — FUROSEMIDE 20 MG PO TABS: 20.0000 mg | ORAL_TABLET | ORAL | 6 refills | Status: DC

## 2016-03-30 NOTE — Telephone Encounter (Signed)
Patient aware.voiced understanding. Repeat labs 9/29

## 2016-03-30 NOTE — Telephone Encounter (Signed)
-----   Message from Graciella Freer, PA-C sent at 03/30/2016  7:24 AM EDT ----- Have hold lasix for 2 days then resume at 20 mg every other day, with extra 20 mg as needed.  Needs repeat BMET Friday.     Casimiro Needle [address]" [place], PA-C 03/30/2016 7:23 AM

## 2016-04-02 ENCOUNTER — Other Ambulatory Visit: Payer: Self-pay | Admitting: Internal Medicine

## 2016-04-02 MED ORDER — WARFARIN SODIUM 5 MG PO TABS: ORAL_TABLET | ORAL | 2 refills | Status: DC

## 2016-04-03 ENCOUNTER — Ambulatory Visit (HOSPITAL_COMMUNITY)
Admission: RE | Admit: 2016-04-03 | Discharge: 2016-04-03 | Disposition: A | Discharge status: Home / Self Care | Payer: Medicare Other | Source: Ambulatory Visit | Attending: Cardiology | Admitting: Cardiology

## 2016-04-03 DIAGNOSIS — I509 Heart failure, unspecified: Secondary | ICD-10-CM

## 2016-04-03 LAB — BASIC METABOLIC PANEL
ANION GAP: 7 (ref 5–15)
BUN: 20 mg/dL (ref 6–20)
CALCIUM: 9.3 mg/dL (ref 8.9–10.3)
CO2: 21 mmol/L — AB (ref 22–32)
Chloride: 107 mmol/L (ref 101–111)
Creatinine, Ser: 1.54 mg/dL — ABNORMAL HIGH (ref 0.61–1.24)
GFR calc Af Amer: 50 mL/min — ABNORMAL LOW (ref >60)
GFR calc non Af Amer: 43 mL/min — ABNORMAL LOW (ref >60)
GLUCOSE: 94 mg/dL (ref 65–99)
Potassium: 4.3 mmol/L (ref 3.5–5.1)
Sodium: 135 mmol/L (ref 135–145)

## 2016-04-17 ENCOUNTER — Other Ambulatory Visit (HOSPITAL_COMMUNITY): Payer: Self-pay | Admitting: Adult Health

## 2016-04-17 DIAGNOSIS — I5022 Chronic systolic (congestive) heart failure: Secondary | ICD-10-CM

## 2016-04-22 DIAGNOSIS — N2581 Secondary hyperparathyroidism of renal origin: Secondary | ICD-10-CM | POA: Diagnosis not present

## 2016-04-22 DIAGNOSIS — N189 Chronic kidney disease, unspecified: Secondary | ICD-10-CM | POA: Diagnosis not present

## 2016-04-22 DIAGNOSIS — N183 Chronic kidney disease, stage 3 (moderate): Secondary | ICD-10-CM | POA: Diagnosis not present

## 2016-04-27 DIAGNOSIS — Z8679 Personal history of other diseases of the circulatory system: Secondary | ICD-10-CM | POA: Diagnosis not present

## 2016-04-27 DIAGNOSIS — N183 Chronic kidney disease, stage 3 (moderate): Secondary | ICD-10-CM | POA: Diagnosis not present

## 2016-04-27 DIAGNOSIS — I4892 Unspecified atrial flutter: Secondary | ICD-10-CM | POA: Diagnosis not present

## 2016-04-27 DIAGNOSIS — I429 Cardiomyopathy, unspecified: Secondary | ICD-10-CM | POA: Diagnosis not present

## 2016-04-27 DIAGNOSIS — R413 Other amnesia: Secondary | ICD-10-CM | POA: Diagnosis not present

## 2016-04-27 DIAGNOSIS — D631 Anemia in chronic kidney disease: Secondary | ICD-10-CM | POA: Diagnosis not present

## 2016-04-27 DIAGNOSIS — I9589 Other hypotension: Secondary | ICD-10-CM | POA: Diagnosis not present

## 2016-04-27 DIAGNOSIS — N2581 Secondary hyperparathyroidism of renal origin: Secondary | ICD-10-CM | POA: Diagnosis not present

## 2016-04-27 DIAGNOSIS — Q2733 Arteriovenous malformation of digestive system vessel: Secondary | ICD-10-CM | POA: Diagnosis not present

## 2016-05-27 ENCOUNTER — Other Ambulatory Visit (HOSPITAL_COMMUNITY): Payer: Self-pay | Admitting: Adult Health

## 2016-06-24 ENCOUNTER — Encounter (HOSPITAL_COMMUNITY): Payer: Medicare Other | Admitting: Internal Medicine

## 2016-07-09 ENCOUNTER — Telehealth: Payer: Self-pay | Admitting: Internal Medicine

## 2016-07-09 ENCOUNTER — Encounter: Payer: Medicare Other | Admitting: Internal Medicine

## 2016-07-09 ENCOUNTER — Encounter (HOSPITAL_COMMUNITY): Payer: Medicare Other | Admitting: Internal Medicine

## 2016-07-09 ENCOUNTER — Encounter: Payer: Self-pay | Admitting: Internal Medicine

## 2016-07-09 NOTE — Telephone Encounter (Signed)
APT. REMINDER CALL, NO ANSWER, NO VOICEMAIL °

## 2016-07-30 ENCOUNTER — Encounter (HOSPITAL_COMMUNITY): Payer: Self-pay | Admitting: Internal Medicine

## 2016-07-30 ENCOUNTER — Ambulatory Visit (HOSPITAL_COMMUNITY)
Admission: RE | Admit: 2016-07-30 | Discharge: 2016-07-30 | Disposition: A | Discharge status: Home / Self Care | Payer: Medicare Other | Source: Ambulatory Visit | Attending: Internal Medicine | Admitting: Internal Medicine

## 2016-07-30 VITALS — BP 112/64 | HR 75 | Wt 170.2 lb

## 2016-07-30 DIAGNOSIS — G8929 Other chronic pain: Secondary | ICD-10-CM | POA: Diagnosis not present

## 2016-07-30 DIAGNOSIS — Z5189 Encounter for other specified aftercare: Secondary | ICD-10-CM | POA: Diagnosis not present

## 2016-07-30 DIAGNOSIS — I5022 Chronic systolic (congestive) heart failure: Secondary | ICD-10-CM | POA: Insufficient documentation

## 2016-07-30 DIAGNOSIS — Z7901 Long term (current) use of anticoagulants: Secondary | ICD-10-CM | POA: Diagnosis not present

## 2016-07-30 DIAGNOSIS — K219 Gastro-esophageal reflux disease without esophagitis: Secondary | ICD-10-CM | POA: Diagnosis not present

## 2016-07-30 DIAGNOSIS — Z79899 Other long term (current) drug therapy: Secondary | ICD-10-CM | POA: Insufficient documentation

## 2016-07-30 DIAGNOSIS — I110 Hypertensive heart disease with heart failure: Secondary | ICD-10-CM | POA: Diagnosis not present

## 2016-07-30 DIAGNOSIS — Z952 Presence of prosthetic heart valve: Secondary | ICD-10-CM | POA: Diagnosis not present

## 2016-07-30 DIAGNOSIS — I4892 Unspecified atrial flutter: Secondary | ICD-10-CM | POA: Diagnosis not present

## 2016-07-30 DIAGNOSIS — I429 Cardiomyopathy, unspecified: Secondary | ICD-10-CM | POA: Diagnosis not present

## 2016-07-30 LAB — CBC
HEMATOCRIT: 37.2 % — AB (ref 39.0–52.0)
Hemoglobin: 11.9 g/dL — ABNORMAL LOW (ref 13.0–17.0)
MCH: 28 pg (ref 26.0–34.0)
MCHC: 32 g/dL (ref 30.0–36.0)
MCV: 87.5 fL (ref 78.0–100.0)
Platelets: 298 10*3/uL (ref 150–400)
RBC: 4.25 MIL/uL (ref 4.22–5.81)
RDW: 15.2 % (ref 11.5–15.5)
WBC: 5 10*3/uL (ref 4.0–10.5)

## 2016-07-30 LAB — BASIC METABOLIC PANEL
ANION GAP: 8 (ref 5–15)
BUN: 14 mg/dL (ref 6–20)
CO2: 26 mmol/L (ref 22–32)
Calcium: 9.6 mg/dL (ref 8.9–10.3)
Chloride: 104 mmol/L (ref 101–111)
Creatinine, Ser: 1.35 mg/dL — ABNORMAL HIGH (ref 0.61–1.24)
GFR calc Af Amer: 59 mL/min — ABNORMAL LOW (ref >60)
GFR, EST NON AFRICAN AMERICAN: 51 mL/min — AB (ref >60)
Glucose, Bld: 87 mg/dL (ref 65–99)
POTASSIUM: 4.2 mmol/L (ref 3.5–5.1)
SODIUM: 138 mmol/L (ref 135–145)

## 2016-07-30 LAB — BRAIN NATRIURETIC PEPTIDE: B Natriuretic Peptide: 68.9 pg/mL (ref 0.0–100.0)

## 2016-07-30 NOTE — Addendum Note (Signed)
Encounter addended by: Noralee Space, RN on: 07/30/2016  2:18 PM<BR>    Actions taken: Medication long-term status modified, Order list changed, Diagnosis association updated, Sign clinical note

## 2016-07-30 NOTE — Patient Instructions (Signed)
Stop Digoxin  Labs today  We will contact you in 4 months to schedule your next appointment.

## 2016-07-30 NOTE — Progress Notes (Signed)
Patient ID: [name], male   DOB: [birth year], 71 y.o.   MRN: [id number]     Advanced Heart Failure Clinic Note    Primary Cardiologist: Dr. Gala Romney Internal Medicine: Dr Earnest Conroy EP: Dr Graciela Husbands   HPI: [name] is a 71 year old male with h/o polysubstance abuse c/b MV endocarditis s/p St. Jude MVR, CHF due to NICM and PAF.  Underwent CRT-P implant for bradycardia and fatigue.   He presents today for regular follow up. Overall feeling pretty good. Rides his bike outside when warm enough. No CP or SOB. Had 2 episodes of dizziness with rapid standing, but not marked or limited. Weight up about 5 pounds in last 2-3 months. No edema, orthopnea or PND. No fevers.   Labs (1/15): K 3.9, creatinine 1.56, hemoglobin 11 Labs (01/14/2015) K 3.2 Creatinine 1.55 BNP 412 Labs (01/30/15): k 3.8 Creatinine 1.47  Labs (8/16): K 4, creatinine 1.55, digoxin 0.6 Labs (9/16) K 3.4, creatinine 1.48 Labs (09/24/2015) K 3.7 Creatinine 1.24   TTE in 2010 showed normal EF with elevated gradient across MV (mean 14) with perivalvular leak. Follow-up TEE in June 2010 with EF 40-45% mild stenosis mean gradient = 8. No vegetation or abscess.   Echo 8/12 EF 35% mild perivalvular MR.  Echo 3/13 EF 50% mild MVR stable with mild peri-valvular leak.  ECHO 12/16/2012 EF 50% stable MVR ECHO 12/2014 - EF 25-30%  Grade I DD, mechanical mitral valve with mean gradient 6 mmHg, no significant MR.  ECHO 09/2015- EF 35-40%. Grade I DD Mitral Valve ok no stenosis  SH: Lives with his wife. No ETOH, tobacco abuse or drugs.  FH: Mom deceased: no cardiac issues        Father deceased: no cardiac issues.   ROS: All systems negative except as listed in HPI, PMH and Problem List.  Past Medical History:  Diagnosis Date  . Atrial fibrillation (HCC)    post op.  s/p dc-cv 04/2008. previously on amiodarone  . Atrial flutter (HCC)    atypical  . AV block, 1st degree    .450 msec--progressive  . Bacterial endocarditis    (due to  IVDA) with subsequent St. 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  a- Echo 07/2008 Ef 55% mild peroprosthetic MVR with High transmitral gradient (mean 14).  b- normal coronaries by cath 12/2007  . BPH (benign prostatic hyperplasia)   . CHF (congestive heart failure) (HCC)    EF 35-40 % 2011 due to valvular disease and diastolic dysfunction  . Chronic back pain   . Dysphagia    with normal barium swallow 09/2008  . Endocarditis   . GERD (gastroesophageal reflux disease)   . Heavy alcohol use    history  . History of GI bleed   . Hypertension   . IV drug abuse    history of  . Lower GI bleed 01/2016  . Pacemaker 2010    Current Outpatient Prescriptions  Medication Sig Dispense Refill  . acetaminophen (TYLENOL) 500 MG tablet Take 1,000 mg by mouth every 6 (six) hours as needed for mild pain.    . carvedilol (COREG) 6.25 MG tablet TAKE 1 TABLET (6.25 MG TOTAL) BY MOUTH 2 (TWO) TIMES DAILY WITH A MEAL. 60 tablet 6  . digoxin (LANOXIN) 0.125 MG tablet TAKE 1 TABLET (0.125 MG TOTAL) BY MOUTH DAILY. 90 tablet 3  . finasteride (PROSCAR) 5 MG tablet Take 5 mg by mouth daily.    . furosemide (LASIX) 20 MG tablet Take 1 tablet (20 mg total) by  mouth every other day. 30 tablet 6  . lisinopril (PRINIVIL,ZESTRIL) 2.5 MG tablet Take 1 tablet (2.5 mg total) by mouth 2 (two) times daily. 60 tablet 6  . Potassium Chloride ER 20 MEQ TBCR TAKE 1 TABLET BY MOUTH DAILY 30 tablet 3  . spironolactone (ALDACTONE) 25 MG tablet Take 1 tablet (25 mg total) by mouth daily. 30 tablet 6  . warfarin (COUMADIN) 5 MG tablet Take 1 tablet (5mg ) daily except Mon and Thurs.  Take 1.5 tablets (7.5mg ) on Mon and Thurs 60 tablet 2   No current facility-administered medications for this encounter.     Vitals:   07/30/16 1402  BP: 112/64  Pulse: 75  SpO2: 100%  Weight: 170 lb 4 oz (77.2 kg)   Wt Readings from Last 3 Encounters:  07/30/16 170 lb 4 oz (77.2 kg)  03/26/16 165 lb 8 oz (75.1 kg)  03/24/16 166 lb 6.4 oz (75.5 kg)     PHYSICAL  EXAM: Gen: Elderly appearing. Walked into clinic with difficulty. HEENT: normal Neck: supple. JVP 5. Carotids 2+ bilat; no bruits.  Cor:  RRR. No rubs, gallops. mechanical s1. No murmur appreciated Lungs: Clear, normal effort Abdomen: soft, NT, ND, no HSM. No bruits or masses. +BS  Extremities: no cyanosis, clubbing, rash. No ankle edema.   Neuro: alert & orientedx3, cranial nerves grossly intact. moves all 4 extremities w/o difficulty. affect pleasant  ASSESSMENT & PLAN: 1. Chronic systolic HF: Nonischemic cardiomyopathy s/p St Jude  CRT-D.  March 2017 EF improved 35-40%.  NYHA class II symptoms. - Volume status looks good on exam. Occasionally dizzy. He can skip a dose of lasix as needed - Continue lasix 20 mg daily. BMET/BNP/Digoxin today today.   - Continue Coreg 6.25 mg bid.  - Will stop digoxin.  - Continue lisinopril 2.5 mg BID. BP too soft for Entresto, especially given intermittent dizziness.  - Continue 25 mg spiro daily 2. Mechanical mitral valve: Valve ok on ECHO. On coumadin + aspirin 81 mg daily.   Goal INR 2.5-3.5.   INR followed at the coumadin clinic. - CBC today.  3. GI Bleed: 03/2014,  - no BRBPR/melena. Continue PPI.  4. HTN - Stable to low.  Asked to call with worsening dizziness or lightheadedness.  5. Memory Impairment - Follows with PCP.   Labs today.   [name] MD 07/30/2016

## 2016-08-26 ENCOUNTER — Other Ambulatory Visit (HOSPITAL_COMMUNITY): Payer: Self-pay | Admitting: Adult Health

## 2016-09-28 ENCOUNTER — Other Ambulatory Visit (HOSPITAL_COMMUNITY): Payer: Self-pay | Admitting: Cardiology

## 2016-09-28 MED ORDER — LISINOPRIL 2.5 MG PO TABS: 2.5000 mg | ORAL_TABLET | Freq: Two times a day (BID) | ORAL | 3 refills | Status: DC

## 2016-10-08 ENCOUNTER — Other Ambulatory Visit: Payer: Self-pay | Admitting: Internal Medicine

## 2016-10-08 MED ORDER — WARFARIN SODIUM 5 MG PO TABS: ORAL_TABLET | ORAL | 2 refills | Status: DC

## 2016-11-02 ENCOUNTER — Other Ambulatory Visit (HOSPITAL_COMMUNITY): Payer: Self-pay | Admitting: Adult Health

## 2016-11-04 ENCOUNTER — Other Ambulatory Visit (HOSPITAL_COMMUNITY): Payer: Self-pay | Admitting: *Deleted

## 2016-11-04 MED ORDER — POTASSIUM CHLORIDE ER 20 MEQ PO TBCR: 1.0000 | EXTENDED_RELEASE_TABLET | Freq: Every day | ORAL | 3 refills | Status: DC

## 2016-11-24 ENCOUNTER — Encounter: Payer: Self-pay | Admitting: Internal Medicine

## 2016-11-25 ENCOUNTER — Other Ambulatory Visit: Payer: Self-pay

## 2016-11-25 MED ORDER — FINASTERIDE 5 MG PO TABS: 5.0000 mg | ORAL_TABLET | Freq: Every day | ORAL | 5 refills | Status: DC

## 2016-11-26 ENCOUNTER — Encounter: Payer: Self-pay | Admitting: Internal Medicine

## 2016-11-26 ENCOUNTER — Ambulatory Visit (INDEPENDENT_AMBULATORY_CARE_PROVIDER_SITE_OTHER): Payer: Medicare Other | Admitting: Internal Medicine

## 2016-11-26 VITALS — BP 109/76 | HR 70 | Temp 97.9°F | Ht 68.0 in | Wt 166.9 lb

## 2016-11-26 DIAGNOSIS — Z79899 Other long term (current) drug therapy: Secondary | ICD-10-CM

## 2016-11-26 DIAGNOSIS — Z87891 Personal history of nicotine dependence: Secondary | ICD-10-CM | POA: Diagnosis not present

## 2016-11-26 DIAGNOSIS — Z23 Encounter for immunization: Secondary | ICD-10-CM | POA: Diagnosis present

## 2016-11-26 DIAGNOSIS — I5022 Chronic systolic (congestive) heart failure: Secondary | ICD-10-CM | POA: Diagnosis not present

## 2016-11-26 DIAGNOSIS — R413 Other amnesia: Secondary | ICD-10-CM | POA: Diagnosis not present

## 2016-11-26 DIAGNOSIS — Z0000 Encounter for general adult medical examination without abnormal findings: Secondary | ICD-10-CM

## 2016-11-26 NOTE — Assessment & Plan Note (Signed)
Assessment/Plan: - he was due for and received PPSV 23 today after receiving PCV 13 over 12 months ago.

## 2016-11-26 NOTE — Patient Instructions (Signed)
Thank you for coming to see me today. It was a pleasure. Today we talked about:   1.  Memory: you scored very well on our memory evaluation test.  I think we can continue to monitor this at your follow up visit.  2.  We have given your the pneumonia vaccine today.  Please follow-up with me in 6 months or sooner if needed.  If you have any questions or concerns, please do not hesitate to call the office at [phone].  Take Care,   [name], DO

## 2016-11-26 NOTE — Progress Notes (Signed)
   CC: here for f/u memory impairment  HPI:  [name] is a 71 y.o. man with a past medical history listed below here today for follow up of his memory impairment.   For details of today's visit and the status of his chronic medical issues please refer to the assessment and plan.   Past Medical History:  Diagnosis Date  . Atrial fibrillation (HCC)    post op.  s/p dc-cv 04/2008. previously on amiodarone  . Atrial flutter (HCC)    atypical  . AV block, 1st degree    .450 msec--progressive  . 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  a- Echo 07/2008 Ef 55% mild peroprosthetic MVR with High transmitral gradient (mean 14).  b- normal coronaries by cath 12/2007  . BPH (benign prostatic hyperplasia)   . CHF (congestive heart failure) (HCC)    EF 35-40 % 2011 due to valvular disease and diastolic dysfunction  . Chronic back pain   . CKD (chronic kidney disease), stage III   . Dysphagia    with normal barium swallow 09/2008  . Endocarditis   . GERD (gastroesophageal reflux disease)   . Heavy alcohol use    history  . History of GI bleed   . Hypertension   . IV drug abuse    history of  . Lower GI bleed 01/2016  . Pacemaker 2010    Review of Systems:  Please see pertinent ROS reviewed in HPI and problem based charting.   Physical Exam:  Vitals:   11/26/16 1333  BP: 109/76  Pulse: 70  Temp: 97.9 F (36.6 C)  TempSrc: Oral  SpO2: 100%  Weight: 166 lb 14.4 oz (75.7 kg)  Height: 5\' 8"  (1.727 m)   General: NAD HEENT: [place]/AT, EOMI, no scleral icterus Cardiac: RRR, + mechanical valve click Pulm: normal effort Ext: warm and well perfused, no pedal edema Neuro: alert and oriented X3, cranial nerves II-XII grossly intact    Assessment & Plan:   See Encounters Tab for problem based charting.  Patient discussed with Dr. Josem Kaufmann .  Chronic systolic heart failure (HCC) Assessment: Stable problem.  Follows with Dr. Milas Kocher at the HF  clinic.  He is euvolemic on exam and his weight is stable.  He continues to exercise regularly without difficulty.  Plan: - continue current management of Lasix 20mg  every other day, carvedilol 6.25mg  BID, Lisinopril 2.5mg  daily, and Spironolactone 25mg  daily.   - His digoxin was discontinued by HF team in January  Memory impairment Assessment: His memory impairment is "going fair" and "improved a little" per his report.  He has not noticed any significant decline in recent months.  He is not accompanied by anyone to provide collateral information.  Lab work last visit was unremarkable.  PHQ score today is 0.  Plan: - MMSE administered today with score of 27/30.  Last visit he scored a 4 on Mini-cog examination which is also normal. - We will continue to monitor his symptoms but not proceed with further work up at this time.  Healthcare maintenance Assessment/Plan: - he was due for and received PPSV 23 today after receiving PCV 13 over 12 months ago.

## 2016-11-26 NOTE — Assessment & Plan Note (Signed)
Assessment: His memory impairment is "going fair" and "improved a little" per his report.  He has not noticed any significant decline in recent months.  He is not accompanied by anyone to provide collateral information.  Lab work last visit was unremarkable.  PHQ score today is 0.  Plan: - MMSE administered today with score of 27/30.  Last visit he scored a 4 on Mini-cog examination which is also normal. - We will continue to monitor his symptoms but not proceed with further work up at this time.

## 2016-11-26 NOTE — Assessment & Plan Note (Signed)
Assessment: Stable problem.  Follows with Dr. Milas Kocher at the HF clinic.  He is euvolemic on exam and his weight is stable.  He continues to exercise regularly without difficulty.  Plan: - continue current management of Lasix 20mg  every other day, carvedilol 6.25mg  BID, Lisinopril 2.5mg  daily, and Spironolactone 25mg  daily.   - His digoxin was discontinued by HF team in January

## 2016-11-26 NOTE — Progress Notes (Signed)
Case discussed with Dr. Wallace at the time of the visit.  We reviewed the resident's history and exam and pertinent patient test results.  I agree with the assessment, diagnosis, and plan of care documented in the resident's note. 

## 2016-12-01 ENCOUNTER — Ambulatory Visit (INDEPENDENT_AMBULATORY_CARE_PROVIDER_SITE_OTHER): Payer: Medicare Other | Admitting: Pharmacist

## 2016-12-01 DIAGNOSIS — I4892 Unspecified atrial flutter: Secondary | ICD-10-CM

## 2016-12-01 DIAGNOSIS — Z7901 Long term (current) use of anticoagulants: Secondary | ICD-10-CM | POA: Diagnosis not present

## 2016-12-01 LAB — POCT INR: INR: 3.7

## 2016-12-01 MED ORDER — WARFARIN SODIUM 5 MG PO TABS: 5.0000 mg | ORAL_TABLET | Freq: Every day | ORAL | 2 refills | Status: DC

## 2016-12-01 NOTE — Progress Notes (Signed)
Anticoagulation Management [name] is a 71 y.o. male who reports to the clinic for monitoring of warfarin treatment.    Indication: atrial fibrillation and atrial flutter Duration: indefinite Supervising physician: Carlynn Purl  Anticoagulation Clinic Visit History: Patient does not report signs/symptoms of bleeding or thromboembolism  Anticoagulation Episode Summary    Current INR goal:   2.5-3.5  TTR:   69.7 % (4.2 y)  Next INR check:   12/08/2016  INR from last check:   3.7! (12/01/2016)  Weekly max warfarin dose:     Target end date:   Indefinite  INR check location:   [facility]  Preferred lab:     Send INR reminders to:   [facility] [address]   Indications   ATRIAL FLUTTER (Resolved) [I48.92] IRREGULAR PULSE [I49.9] Long term (current) use of anticoagulants (Resolved) [Z79.01]       Comments:         Anticoagulation Care Providers    Provider Role Specialty Phone number   [name], MD  Cardiology [phone]     ASSESSMENT Recent Results: The most recent result is correlated with 40 mg per week: Lab Results  Component Value Date   INR 3.7 12/01/2016   INR 2.80 03/02/2016   INR 1.70 02/24/2016   Anticoagulation Dosing: INR as of 12/01/2016 and Previous Warfarin Dosing Information    INR Dt INR Goal [facility] Sun Mon Tue Wed Thu Fri Sat   12/01/2016 3.7 2.5-3.5 40 mg 5 mg 7.5 mg 5 mg 5 mg 7.5 mg 5 mg 5 mg    Previous description       Anticoagulation Warfarin Dose Instructions as of 12/01/2016      Total Sun Mon Tue Wed Thu Fri Sat   New Dose 35 mg 5 mg 5 mg 5 mg 5 mg 5 mg 5 mg 5 mg     (5 mg x 1)  (5 mg x 1)  (5 mg x 1)  (5 mg x 1)  (5 mg x 1)  (5 mg x 1)  (5 mg x 1)                         Description         INR today: slightly Supratherapeutic  PLAN Weekly dose was unchanged, but more frequent monitoring  There are no Patient Instructions on file for this visit. Patient advised to contact clinic or seek  medical attention if signs/symptoms of bleeding or thromboembolism occur.  Patient verbalized understanding by repeating back information and was advised to contact me if further medication-related questions arise. Patient was also provided an information handout.  Follow-up Return in about 1 week (around 12/08/2016).  [name]

## 2016-12-01 NOTE — Progress Notes (Signed)
INTERNAL MEDICINE TEACHING ATTENDING ADDENDUM - Gust Rung, DO Duration- indefinate, Indication- afib, INR-  Lab Results  Component Value Date   INR 3.7 12/01/2016  . Agree with pharmacy recommendations as outlined in their note.

## 2016-12-02 ENCOUNTER — Other Ambulatory Visit: Payer: Self-pay | Admitting: Internal Medicine

## 2016-12-08 ENCOUNTER — Ambulatory Visit (INDEPENDENT_AMBULATORY_CARE_PROVIDER_SITE_OTHER): Payer: Medicare Other | Admitting: Pharmacist

## 2016-12-08 DIAGNOSIS — I4892 Unspecified atrial flutter: Secondary | ICD-10-CM | POA: Diagnosis present

## 2016-12-08 DIAGNOSIS — Z7901 Long term (current) use of anticoagulants: Secondary | ICD-10-CM | POA: Diagnosis not present

## 2016-12-08 LAB — POCT INR: INR: 3.1

## 2016-12-08 NOTE — Patient Instructions (Signed)
Patient educated about medication as defined in this encounter and verbalized understanding by repeating back instructions provided.   

## 2016-12-08 NOTE — Progress Notes (Signed)
Anticoagulation Management [name] is a 71 y.o. male who reports to the clinic for monitoring of warfarin treatment.    Indication: mitral valve replacement  Duration: indefinite Supervising physician: Blanch Media  Anticoagulation Clinic Visit History: Patient does not report signs/symptoms of bleeding or thromboembolism  Anticoagulation Episode Summary    Current INR goal:   2.5-3.5  TTR:   69.7 % (4.2 y)  Next INR check:   01/12/2017  INR from last check:   3.1 (12/08/2016)  Weekly max warfarin dose:     Target end date:   Indefinite  INR check location:   [facility]  Preferred lab:     Send INR reminders to:   [facility] [address]   Indications   ATRIAL FLUTTER (Resolved) [I48.92] IRREGULAR PULSE [I49.9] Long term (current) use of anticoagulants (Resolved) [Z79.01]       Comments:         Anticoagulation Care Providers    Provider Role Specialty Phone number   [name], MD  Cardiology [phone]     ASSESSMENT Recent Results: The most recent result is correlated with 35 mg per week: Lab Results  Component Value Date   INR 3.1 12/08/2016   INR 3.7 12/01/2016   INR 2.80 03/02/2016   Anticoagulation Dosing: INR as of 12/08/2016 and Previous Warfarin Dosing Information    INR Dt INR Goal [facility] Sun Mon Tue Wed Thu Fri Sat   12/08/2016 3.1 2.5-3.5 35 mg 5 mg 5 mg 5 mg 5 mg 5 mg 5 mg 5 mg    Previous description       Anticoagulation Warfarin Dose Instructions as of 12/08/2016      Total Sun Mon Tue Wed Thu Fri Sat   New Dose 35 mg 5 mg 5 mg 5 mg 5 mg 5 mg 5 mg 5 mg     (5 mg x 1)  (5 mg x 1)  (5 mg x 1)  (5 mg x 1)  (5 mg x 1)  (5 mg x 1)  (5 mg x 1)                         Description         INR today: Therapeutic  PLAN Weekly dose was unchanged  Patient Instructions  Patient educated about medication as defined in this encounter and verbalized understanding by repeating back instructions provided.    Patient advised to contact clinic or seek medical attention if signs/symptoms of bleeding or thromboembolism occur.  Patient verbalized understanding by repeating back information and was advised to contact me if further medication-related questions arise. Patient was also provided an information handout.  Follow-up Return in about 6 weeks (around 01/19/2017) for Follow up INR 01/19/2017 around 1:30 pm.  [name]

## 2016-12-28 ENCOUNTER — Other Ambulatory Visit (HOSPITAL_COMMUNITY): Payer: Self-pay | Admitting: Student

## 2016-12-28 DIAGNOSIS — I5022 Chronic systolic (congestive) heart failure: Secondary | ICD-10-CM

## 2016-12-31 ENCOUNTER — Other Ambulatory Visit (HOSPITAL_COMMUNITY): Payer: Self-pay | Admitting: Adult Health

## 2017-01-19 ENCOUNTER — Telehealth: Payer: Self-pay | Admitting: Pharmacist

## 2017-01-19 ENCOUNTER — Other Ambulatory Visit (INDEPENDENT_AMBULATORY_CARE_PROVIDER_SITE_OTHER): Payer: Medicare Other

## 2017-01-19 DIAGNOSIS — Z7901 Long term (current) use of anticoagulants: Secondary | ICD-10-CM

## 2017-01-19 LAB — POCT INR: INR: 3.8

## 2017-01-19 NOTE — Progress Notes (Signed)
I was asked to see [name] b/c of elevated INR to 3.8.   [name] has a history of MVR with mechanical valve and INR goal of 2.5 - 3.5.  His INR has been rising somewhat.  He reports headache for about 3 days, but this has resolved 2 days ago.  He feels fatigued.  He has no obvious bleeding, gum bleeding or anemia.  He reports no falls or weakness.  He has occasional dizziness when he leans over and stands back up.  He notes the headache resolved with Excedrin.   I did a brief physical exam.  He is alert and oriented.  He has a normal gait.  CN 2-12 grossly intact.  His strength is 5/5 throughout and he has no sensory deficits.  EOMI.    Plan Decrease coumadin from 5mg  to 2.5mg  for 3 days.  Commence 5mg  per day after that.   Return to clinic to see Dr. Selena Batten in 7 days on 7/24.

## 2017-01-19 NOTE — Telephone Encounter (Signed)
Alerted by lab that patient had an INR today performed that Dr. Criselda Peaches reacted to:  3.8 on 5mg  QD. She advised patient to take 1/2 of his regularly scheduled dose (1/2 x 5mg  = 2.5mg ) for 3 consecutive days, resume then--5mg  QD and RTC [id number] to see Dr. Selena Batten. I have documented this in [email or web address] as well.

## 2017-01-19 NOTE — Patient Instructions (Signed)
[name] - -  Your INR was a little high today.    I think you should take 1/2 tablet (2.5mg ) for the next 3 days (Wednesday, Thursday and Friday) and then go back to 5mg  tablets after that.    Please come back to see Dr. Selena Batten in 7 days on 7/24.   Thank you.

## 2017-01-26 ENCOUNTER — Ambulatory Visit (INDEPENDENT_AMBULATORY_CARE_PROVIDER_SITE_OTHER): Payer: Medicare Other | Admitting: Pharmacist

## 2017-01-26 DIAGNOSIS — I4892 Unspecified atrial flutter: Secondary | ICD-10-CM

## 2017-01-26 DIAGNOSIS — Z7901 Long term (current) use of anticoagulants: Secondary | ICD-10-CM

## 2017-01-26 LAB — POCT INR: INR: 2.7

## 2017-01-27 NOTE — Progress Notes (Signed)
Anticoagulation Management [name] is a 71 y.o. male who reports to the clinic for monitoring of warfarin treatment.    Indication: atrial flutter and mitral valve replacement Duration: indefinite Supervising physician: Blanch Media  Anticoagulation Clinic Visit History: Patient does not report signs/symptoms of bleeding or thromboembolism  Anticoagulation Episode Summary    Current INR goal:   2.5-3.5  TTR:   69.4 % (4.4 y)  Next INR check:   02/01/2017  INR from last check:     Weekly max warfarin dose:     Target end date:   Indefinite  INR check location:   [facility]  Preferred lab:     Send INR reminders to:   [facility] [address]   Indications   ATRIAL FLUTTER (Resolved) [I48.92] IRREGULAR PULSE [I49.9] Long term (current) use of anticoagulants (Resolved) [Z79.01]       Comments:         Anticoagulation Care Providers    Provider Role Specialty Phone number   [name], MD  Cardiology [phone]     ASSESSMENT Recent Results: The most recent result is correlated with 35 mg per week: Lab Results  Component Value Date   INR 2.7 01/26/2017   INR 3.8 01/19/2017   INR 3.1 12/08/2016   Anticoagulation Dosing: INR as of 01/26/2017 and Previous Warfarin Dosing Information    INR Dt INR Goal Wkly Tot Sun Mon Tue Wed Thu Fri Sat     2.5-3.5 17.5 mg 2.5 mg 2.5 mg 2.5 mg 2.5 mg 2.5 mg 2.5 mg 2.5 mg   Patient deviated from recommended dosing.       Previous description       Anticoagulation Warfarin Dose Instructions as of 01/26/2017      Total Sun Mon Tue Wed Thu Fri Sat   New Dose 22.5 mg 5 mg 2.5 mg 2.5 mg 2.5 mg 2.5 mg 2.5 mg 5 mg     (5 mg x 1)  (5 mg x 0.5)  (5 mg x 0.5)  (5 mg x 0.5)  (5 mg x 0.5)  (5 mg x 0.5)  (5 mg x 1)                         Description         INR today: Therapeutic  PLAN Patient was confused on dosing instructions and inadvertently reduced from 1 tablet daily to 1/2 tablet (2.5 mg)  daily. Advised patient to take 1 full tablet on each Saturday and Sunday, return to clinic in 1 week.  Patient Instructions  Patient educated about medication as defined in this encounter and verbalized understanding by repeating back instructions provided.   Patient advised to contact clinic or seek medical attention if signs/symptoms of bleeding or thromboembolism occur.  Patient verbalized understanding by repeating back information and was advised to contact me if further medication-related questions arise. Patient was also provided an information handout.  Follow-up Return in about 6 days (around 02/01/2017).  [name]

## 2017-01-27 NOTE — Patient Instructions (Signed)
Patient educated about medication as defined in this encounter and verbalized understanding by repeating back instructions provided.   

## 2017-02-01 ENCOUNTER — Ambulatory Visit (INDEPENDENT_AMBULATORY_CARE_PROVIDER_SITE_OTHER): Payer: Medicare Other | Admitting: Pharmacist

## 2017-02-01 DIAGNOSIS — I4892 Unspecified atrial flutter: Secondary | ICD-10-CM

## 2017-02-01 DIAGNOSIS — Z7901 Long term (current) use of anticoagulants: Secondary | ICD-10-CM

## 2017-02-01 LAB — POCT INR: INR: 2.2

## 2017-02-01 NOTE — Patient Instructions (Signed)
Patient instructed to take medications as defined in the Anti-coagulation Track section of this encounter.  Patient instructed to take today's dose.  Patient instructed to take 1/2 tablet of your 5mg  peach-colored warfarin tablets by mouth, once-daily at [facility] on Mondays, Wednesdays and Fridays; all other days--take one (1) tablet.  Patient verbalized understanding of these instructions.

## 2017-02-01 NOTE — Progress Notes (Signed)
Anticoagulation Management [name] is a 71 y.o. male who reports to the clinic for monitoring of warfarin treatment.    Indication: Atrial Flutter.  Duration: indefinite Supervising physician: Debe Coder  Anticoagulation Clinic Visit History: Patient does not report signs/symptoms of bleeding or thromboembolism  Other recent changes: No diet, medications, lifestyle changes endorsed by the patient.  Anticoagulation Episode Summary    Current INR goal:   2.5-3.5  TTR:   69.2 % (4.4 y)  Next INR check:   03/01/2017  INR from last check:   2.20! (02/01/2017)  Weekly max warfarin dose:     Target end date:   Indefinite  INR check location[facility]   [facility]  Preferred lab:     Send INR reminders to:   [facility] [address]   Indications   ATRIAL FLUTTER (Resolved) [I48.92] IRREGULAR PULSE [I49.9] Long term (current) use of anticoagulants (Resolved) [Z79.01]       Comments:         Anticoagulation Care Providers    Provider Role Specialty Phone number   [name], MD  Cardiology [phone]      No Known Allergies Prior to Admission medications   Medication Sig Start Date End Date Taking? Authorizing Provider  acetaminophen (TYLENOL) 500 MG tablet Take 1,000 mg by mouth every 6 (six) hours as needed for mild pain.   Yes [provider]  carvedilol (COREG) 6.25 MG tablet TAKE 1 TABLET BY MOUTH TWICE A DAY WITH A MEAL 12/29/16  Yes Bensimhon, Bevelyn Buckles, MD  finasteride (PROSCAR) 5 MG tablet Take 1 tablet (5 mg total) by mouth daily. 11/25/16  Yes [name], DO  furosemide (LASIX) 20 MG tablet Take 1 tablet (20 mg total) by mouth every other day. 03/30/16  Yes Graciella Freer, PA-C  furosemide (LASIX) 20 MG tablet TAKE 1 TABLET (20 MG TOTAL) BY MOUTH DAILY. 12/04/16  Yes Bensimhon, Bevelyn Buckles, MD  lisinopril (PRINIVIL,ZESTRIL) 2.5 MG tablet Take 1 tablet (2.5 mg total) by mouth 2 (two) times daily. 09/28/16  Yes Bensimhon, Bevelyn Buckles, MD   Potassium Chloride ER 20 MEQ TBCR Take 1 tablet by mouth daily. 11/04/16  Yes [name], MD  spironolactone (ALDACTONE) 25 MG tablet Take 1 tablet (25 mg total) by mouth daily. 03/24/16  Yes Graciella Freer, PA-C  warfarin (COUMADIN) 5 MG tablet Take 1 tablet (5 mg total) by mouth daily. D/c previous prescription for warfarin 12/01/16  Yes Gust Rung, DO   Past Medical History:  Diagnosis Date  . Atrial fibrillation (HCC)    post op.  s/p dc-cv 04/2008. previously on amiodarone  . Atrial flutter (HCC)    atypical  . AV block, 1st degree    .450 msec--progressive  . 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  a- Echo 07/2008 Ef 55% mild peroprosthetic MVR with High transmitral gradient (mean 14).  b- normal coronaries by cath 12/2007  . BPH (benign prostatic hyperplasia)   . CHF (congestive heart failure) (HCC)    EF 35-40 % 2011 due to valvular disease and diastolic dysfunction  . Chronic back pain   . CKD (chronic kidney disease), stage III   . Dysphagia    with normal barium swallow 09/2008  . Endocarditis   . GERD (gastroesophageal reflux disease)   . Heavy alcohol use    history  . History of GI bleed   . Hypertension   . IV drug  abuse    history of  . Lower GI bleed 01/2016  . Pacemaker 2010   Social History   Social History  . Marital status: Divorced    Spouse name: N/A  . Number of children: 9  . Years of education: N/A   Occupational History  . retired    Social History Main Topics  . Smoking status: Former Smoker    Years: 0.00    Types: Cigarettes  . Smokeless tobacco: Never Used  . Alcohol use No  . Drug use: No  . Sexual activity: Not on file   Other Topics Concern  . Not on file   Social History Narrative   Exercise everyday riding a bike for 1 hour   Family History  Problem Relation Age of Onset  . Coronary artery disease Mother     ASSESSMENT Recent Results: The most recent result is  correlated with 22.5 mg per week: Lab Results  Component Value Date   INR 2.20 02/01/2017   INR 2.7 01/26/2017   INR 3.8 01/19/2017    Anticoagulation Dosing: INR as of 02/01/2017 and Previous Warfarin Dosing Information    INR Dt INR Goal [facility] Sun Mon Tue Wed Thu Fri Sat   02/01/2017 2.20 2.5-3.5 22.5 mg 5 mg 2.5 mg 2.5 mg 2.5 mg 2.5 mg 2.5 mg 5 mg    Previous description       Anticoagulation Warfarin Dose Instructions as of 02/01/2017      Total Sun Mon Tue Wed Thu Fri Sat   New Dose 27.5 mg 5 mg 2.5 mg 5 mg 2.5 mg 5 mg 2.5 mg 5 mg     (5 mg x 1)  (5 mg x 0.5)  (5 mg x 1)  (5 mg x 0.5)  (5 mg x 1)  (5 mg x 0.5)  (5 mg x 1)                         Description         INR today: Subtherapeutic  PLAN Weekly dose was increased by 18% to 27.37f mg per week  Patient Instructions  Patient instructed to take medications as defined in the Anti-coagulation Track section of this encounter.  Patient instructed to take today's dose.  Patient instructed to take 1/2 tablet of your 5mg  peach-colored warfarin tablets by mouth, once-daily at [facility] on Mondays, Wednesdays and Fridays; all other days--take one (1) tablet.  Patient verbalized understanding of these instructions.     Patient advised to contact clinic or seek medical attention if signs/symptoms of bleeding or thromboembolism occur.  Patient verbalized understanding by repeating back information and was advised to contact me if further medication-related questions arise. Patient was also provided an information handout.  Follow-up Return in 4 weeks (on 03/01/2017) for Follow up  INR at 1000h.  Gar Ponto PharmD, CACP, CPP  15 minutes spent face-to-face with the patient during the encounter. 50% of time spent on education. 50% of time was spent on fingerstick point of care INR sample collection, processing, results interpretation, data entry in to EPIC/CHL and [email or web address].

## 2017-02-02 NOTE — Progress Notes (Signed)
I have reviewed Dr. Saralyn Pilar note.  Patient is on [facility] for Mitral valve replacement, I do not see evidence of Aflutter on his problem list.  His INR was low and his dose was increased.

## 2017-03-01 ENCOUNTER — Ambulatory Visit (INDEPENDENT_AMBULATORY_CARE_PROVIDER_SITE_OTHER): Payer: Medicare Other | Admitting: Pharmacist

## 2017-03-01 DIAGNOSIS — I4892 Unspecified atrial flutter: Secondary | ICD-10-CM

## 2017-03-01 DIAGNOSIS — Z7901 Long term (current) use of anticoagulants: Secondary | ICD-10-CM

## 2017-03-01 LAB — POCT INR: INR: 2.4

## 2017-03-01 NOTE — Patient Instructions (Signed)
Patient instructed to take medications as defined in the Anti-coagulation Track section of this encounter.  Patient instructed to take today's dose.  Patient instructed to take 1 tablet by mouth once-daily at 6PM all days of week---except on Saturdays, take only 1/2 tablet of your 5mg  peach-colored warfarin tablets on Saturdays.  Patient verbalized understanding of these instructions.

## 2017-03-01 NOTE — Progress Notes (Signed)
Anticoagulation Management [name] is a 71 y.o. male who reports to the clinic for monitoring of warfarin treatment.    Indication: atrial flutter.  Duration: indefinite Supervising physician: Earl Lagos  Anticoagulation Clinic Visit History: Patient does not report signs/symptoms of bleeding or thromboembolism  Other recent changes: No diet, medications, lifestyle changes endorsed by the patient to me.  Anticoagulation Episode Summary    Current INR goal:   2.5-3.5  TTR:   68.1 % (4.5 y)  Next INR check:   03/22/2017  INR from last check:   2.40! (03/01/2017)  Weekly max warfarin dose:     Target end date:   Indefinite  INR check location[facility]   [facility]  Preferred lab:     Send INR reminders to:   [facility] [address]   Indications   ATRIAL FLUTTER (Resolved) [I48.92] IRREGULAR PULSE [I49.9] Long term (current) use of anticoagulants (Resolved) [Z79.01]       Comments:         Anticoagulation Care Providers    Provider Role Specialty Phone number   [name], MD  Cardiology [phone]      No Known Allergies Prior to Admission medications   Medication Sig Start Date End Date Taking? Authorizing Provider  acetaminophen (TYLENOL) 500 MG tablet Take 1,000 mg by mouth every 6 (six) hours as needed for mild pain.   Yes [provider]  carvedilol (COREG) 6.25 MG tablet TAKE 1 TABLET BY MOUTH TWICE A DAY WITH A MEAL 12/29/16  Yes Bensimhon, Bevelyn Buckles, MD  finasteride (PROSCAR) 5 MG tablet Take 1 tablet (5 mg total) by mouth daily. 11/25/16  Yes [name], DO  furosemide (LASIX) 20 MG tablet Take 1 tablet (20 mg total) by mouth every other day. 03/30/16  Yes Graciella Freer, PA-C  furosemide (LASIX) 20 MG tablet TAKE 1 TABLET (20 MG TOTAL) BY MOUTH DAILY. 12/04/16  Yes Bensimhon, Bevelyn Buckles, MD  lisinopril (PRINIVIL,ZESTRIL) 2.5 MG tablet Take 1 tablet (2.5 mg total) by mouth 2 (two) times daily. 09/28/16  Yes Bensimhon, Bevelyn Buckles, MD  Potassium Chloride ER 20 MEQ TBCR Take 1 tablet by mouth daily. 11/04/16  Yes [name], MD  spironolactone (ALDACTONE) 25 MG tablet Take 1 tablet (25 mg total) by mouth daily. 03/24/16  Yes Graciella Freer, PA-C  warfarin (COUMADIN) 5 MG tablet Take 1 tablet (5 mg total) by mouth daily. D/c previous prescription for warfarin 12/01/16  Yes Gust Rung, DO   Past Medical History:  Diagnosis Date  . Atrial fibrillation (HCC)    post op.  s/p dc-cv 04/2008. previously on amiodarone  . Atrial flutter (HCC)    atypical  . AV block, 1st degree    .450 msec--progressive  . 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  a- Echo 07/2008 Ef 55% mild peroprosthetic MVR with High transmitral gradient (mean 14).  b- normal coronaries by cath 12/2007  . BPH (benign prostatic hyperplasia)   . CHF (congestive heart failure) (HCC)    EF 35-40 % 2011 due to valvular disease and diastolic dysfunction  . Chronic back pain   . CKD (chronic kidney disease), stage III   . Dysphagia    with normal barium swallow 09/2008  . Endocarditis   . GERD (gastroesophageal reflux disease)   . Heavy alcohol use    history  . History of GI bleed   . Hypertension   .  IV drug abuse    history of  . Lower GI bleed 01/2016  . Pacemaker 2010   Social History   Social History  . Marital status: Divorced    Spouse name: N/A  . Number of children: 9  . Years of education: N/A   Occupational History  . retired    Social History Main Topics  . Smoking status: Former Smoker    Years: 0.00    Types: Cigarettes  . Smokeless tobacco: Never Used  . Alcohol use No  . Drug use: No  . Sexual activity: Not on file   Other Topics Concern  . Not on file   Social History Narrative   Exercise everyday riding a bike for 1 hour   Family History  Problem Relation Age of Onset  . Coronary artery disease Mother     ASSESSMENT Recent Results: The most recent result  is correlated with 27.5 mg per week: Lab Results  Component Value Date   INR 2.40 03/01/2017   INR 2.20 02/01/2017   INR 2.7 01/26/2017    Anticoagulation Dosing: INR as of 03/01/2017 and Previous Warfarin Dosing Information    INR Dt INR Goal [facility] Sun Mon Tue Wed Thu Fri Sat   03/01/2017 2.40 2.5-3.5 27.5 mg 5 mg 2.5 mg 5 mg 2.5 mg 5 mg 2.5 mg 5 mg    Previous description       Anticoagulation Warfarin Dose Instructions as of 03/01/2017      Total Sun Mon Tue Wed Thu Fri Sat   New Dose 32.5 mg 5 mg 5 mg 5 mg 5 mg 5 mg 5 mg 2.5 mg     (5 mg x 1)  (5 mg x 1)  (5 mg x 1)  (5 mg x 1)  (5 mg x 1)  (5 mg x 1)  (5 mg x 0.5)                         Description    Patient instructed to take 1 tablet by mouth once-daily at 6PM all days of week---except on Saturdays, take only 1/2 tablet of your 5mg  peach-colored warfarin tablets on Saturdays.      INR today: Subtherapeutic  PLAN Weekly dose was increased by 15% to 32.5 mg per week  Patient Instructions  Patient instructed to take medications as defined in the Anti-coagulation Track section of this encounter.  Patient instructed to take today's dose.  Patient instructed to take 1 tablet by mouth once-daily at 6PM all days of week---except on Saturdays, take only 1/2 tablet of your 5mg  peach-colored warfarin tablets on Saturdays.  Patient verbalized understanding of these instructions.     Patient advised to contact clinic or seek medical attention if signs/symptoms of bleeding or thromboembolism occur.  Patient verbalized understanding by repeating back information and was advised to contact me if further medication-related questions arise. Patient was also provided an information handout.  Follow-up Return in 3 weeks (on 03/22/2017) for Follow up  INR at 1015h.  Gar Ponto, PharmD, CACP, CPP  15 minutes spent face-to-face with the patient during the encounter. 50% of time spent on education. 50% of time was  spent on fingerstick point of care INR sample collection, processing, results interpretation, dose adjustment and documentation in EPIC/CHL, [email or web address]

## 2017-03-02 NOTE — Progress Notes (Signed)
INTERNAL MEDICINE TEACHING ATTENDING ADDENDUM - [place] Rollyson M.D  °Duration- indefinite, Indication- mechanical mitral valve, INR- sub therapeutic. Agree with pharmacy recommendations as outlined in their note.  ° ° ° °

## 2017-03-05 ENCOUNTER — Other Ambulatory Visit: Payer: Self-pay | Admitting: *Deleted

## 2017-03-05 ENCOUNTER — Other Ambulatory Visit (HOSPITAL_COMMUNITY): Payer: Self-pay | Admitting: Student

## 2017-03-05 DIAGNOSIS — I4892 Unspecified atrial flutter: Secondary | ICD-10-CM

## 2017-03-09 MED ORDER — WARFARIN SODIUM 5 MG PO TABS: ORAL_TABLET | ORAL | 2 refills | Status: DC

## 2017-03-12 ENCOUNTER — Other Ambulatory Visit: Payer: Self-pay | Admitting: Cardiology

## 2017-03-22 ENCOUNTER — Ambulatory Visit: Payer: Medicare Other

## 2017-03-29 ENCOUNTER — Ambulatory Visit (INDEPENDENT_AMBULATORY_CARE_PROVIDER_SITE_OTHER): Payer: Medicare Other | Admitting: Pharmacist

## 2017-03-29 DIAGNOSIS — I4892 Unspecified atrial flutter: Secondary | ICD-10-CM

## 2017-03-29 DIAGNOSIS — Z7901 Long term (current) use of anticoagulants: Secondary | ICD-10-CM | POA: Diagnosis not present

## 2017-03-29 LAB — POCT INR: INR: 4.2

## 2017-03-29 NOTE — Progress Notes (Signed)
INTERNAL MEDICINE TEACHING ATTENDING ADDENDUM - Earl Lagos M.D  Duration- indefinite, Indication- s/p mechanical MVR, INR- supratherapeutic. Agree with pharmacy recommendations as outlined in their note.

## 2017-03-29 NOTE — Patient Instructions (Signed)
Patient instructed to take medications as defined in the Anti-coagulation Track section of this encounter.  Patient instructed to take today's dose.  Patient instructed to take 1 tablet by mouth once-daily at [facility] all days of week---except on Mondays, Wednesdays and Fridays--take only 1/2 tablet of your 5mg  peach-colored warfarin tablets on Mondays, Wednesdays and Fridays.  Patient verbalized understanding of these instructions.

## 2017-03-29 NOTE — Progress Notes (Signed)
Anticoagulation Management [name] is a 71 y.o. male who reports to the clinic for monitoring of warfarin treatment.    Indication: Atrial Flutter (resolved) [148.92], irregular pulse [149.9], Long term (current) use of anticoagulants [Z79.01]. Duration: indefinite Supervising physician: Earl Lagos  Anticoagulation Clinic Visit History: Patient does not report signs/symptoms of bleeding or thromboembolism  Other recent changes: No diet, medications, lifestyle changes endorsed to me.  Anticoagulation Episode Summary    Current INR goal:   2.5-3.5  TTR:   67.8 % (4.5 y)  Next INR check:   04/19/2017  INR from last check:   4.20! (03/29/2017)  Weekly max warfarin dose:     Target end date:   Indefinite  INR check location:   [facility]  Preferred lab:     Send INR reminders to:   [facility] [address]   Indications   ATRIAL FLUTTER (Resolved) [I48.92] IRREGULAR PULSE [I49.9] Long term (current) use of anticoagulants (Resolved) [Z79.01]       Comments:         Anticoagulation Care Providers    Provider Role Specialty Phone number   [name], MD  Cardiology [phone]      No Known Allergies Prior to Admission medications   Medication Sig Start Date End Date Taking? Authorizing Provider  carvedilol (COREG) 6.25 MG tablet TAKE 1 TABLET BY MOUTH TWICE A DAY WITH A MEAL 12/29/16  Yes Bensimhon, Bevelyn Buckles, MD  finasteride (PROSCAR) 5 MG tablet Take 1 tablet (5 mg total) by mouth daily. 11/25/16  Yes [name], DO  furosemide (LASIX) 20 MG tablet TAKE 1 TABLET (20 MG TOTAL) BY MOUTH DAILY. 12/04/16  Yes Bensimhon, Bevelyn Buckles, MD  KLOR-CON M20 20 MEQ tablet TAKE 1 TABLET BY MOUTH EVERY DAY 03/16/17  Yes Bensimhon, Bevelyn Buckles, MD  lisinopril (PRINIVIL,ZESTRIL) 2.5 MG tablet Take 1 tablet (2.5 mg total) by mouth 2 (two) times daily. 09/28/16  Yes Bensimhon, Bevelyn Buckles, MD  spironolactone (ALDACTONE) 25 MG tablet TAKE 1 TABLET BY MOUTH EVERY DAY 03/09/17   Yes [name], MD  warfarin (COUMADIN) 5 MG tablet take 1 tablet daily at [facility] all days of week---except on Saturdays, take only 1/2 tablet   on Saturdays. 03/09/17  Yes [name], DO  acetaminophen (TYLENOL) 500 MG tablet Take 1,000 mg by mouth every 6 (six) hours as needed for mild pain.    [provider]  furosemide (LASIX) 20 MG tablet Take 1 tablet (20 mg total) by mouth every other day. Patient not taking: Reported on 03/29/2017 03/30/16   Graciella Freer, PA-C  Potassium Chloride ER 20 MEQ TBCR Take 1 tablet by mouth daily. Patient not taking: Reported on 03/29/2017 11/04/16   [name], MD   Past Medical History:  Diagnosis Date  . Atrial fibrillation (HCC)    post op.  s/p dc-cv 04/2008. previously on amiodarone  . Atrial flutter (HCC)    atypical  . AV block, 1st degree    .450 msec--progressive  . 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  a- Echo 07/2008 Ef 55% mild peroprosthetic MVR with High transmitral gradient (mean 14).  b- normal coronaries by cath 12/2007  . BPH (benign prostatic hyperplasia)   . CHF (congestive heart failure) (HCC)    EF 35-40 % 2011 due to valvular disease and diastolic dysfunction  . Chronic back pain   . CKD (chronic kidney disease), stage III   .  Dysphagia    with normal barium swallow 09/2008  . Endocarditis   . GERD (gastroesophageal reflux disease)   . Heavy alcohol use    history  . History of GI bleed   . Hypertension   . IV drug abuse    history of  . Lower GI bleed 01/2016  . Pacemaker 2010   Social History   Social History  . Marital status: Divorced    Spouse name: N/A  . Number of children: 9  . Years of education: N/A   Occupational History  . retired    Social History Main Topics  . Smoking status: Former Smoker    Years: 0.00    Types: Cigarettes  . Smokeless tobacco: Never Used  . Alcohol use No  . Drug use: No  . Sexual activity: Not on file    Other Topics Concern  . Not on file   Social History Narrative   Exercise everyday riding a bike for 1 hour   Family History  Problem Relation Age of Onset  . Coronary artery disease Mother     ASSESSMENT Recent Results: The most recent result is correlated with 32.5 mg per week: Lab Results  Component Value Date   INR 4.20 03/29/2017   INR 2.40 03/01/2017   INR 2.20 02/01/2017    Anticoagulation Dosing: INR as of 03/29/2017 and Previous Warfarin Dosing Information    INR Dt INR Goal Wkly Tot Sun Mon Tue Wed Thu Fri Sat   03/29/2017 4.20 2.5-3.5 32.5 mg 5 mg 5 mg 5 mg 5 mg 5 mg 5 mg 2.5 mg    Previous description    Patient instructed to take 1 tablet by mouth once-daily at 6PM all days of week---except on Saturdays, take only 1/2 tablet of your  peach-colored warfarin tablets on Saturdays.    Anticoagulation Warfarin Dose Instructions as of 03/29/2017      Total Sun Mon Tue Wed Thu Fri Sat   New Dose 27.5 mg 5 mg 2.5 mg 5 mg 2.5 mg 5 mg 2.5 mg 5 mg     (5 mg x 1)  (5 mg x 0.5)  (5 mg x 1)  (5 mg x 0.5)  (5 mg x 1)  (5 mg x 0.5)  (5 mg x 1)                         Description   Patient instructed to take 1 tablet by mouth once-daily at 6PM all days of week---except on Mondays, Wednesdays and Fridays--take only 1/2 tablet of your  peach-colored warfarin tablets on Mondays, Wednesdays and Fridays.      INR today: Supratherapeutic  PLAN Weekly dose was decreased by 15% to 27.5 mg per week  Patient Instructions  Patient instructed to take medications as defined in the Anti-coagulation Track section of this encounter.  Patient instructed to take today's dose.  Patient instructed to take 1 tablet by mouth once-daily at [facility] all days of week---except on Mondays, Wednesdays and Fridays--take only 1/2 tablet of your  peach-colored warfarin tablets on Mondays, Wednesdays and Fridays.  Patient verbalized understanding of these instructions.     Patient advised  to contact clinic or seek medical attention if signs/symptoms of bleeding or thromboembolism occur.  Patient verbalized understanding by repeating back information and was advised to contact me if further medication-related questions arise. Patient was also provided an information handout.  Follow-up Return in 3 weeks (on 04/19/2017) for  Follow up INR at 1045h.  Elicia Lamp, PharmD, CACP, CPP  15 minutes spent face-to-face with the patient during the encounter. 50% of time spent on education. 50% of time was spent on fingerstick point of care INR sample collection, processing, results determination and dose adjustment, documentation in EPIC/CHL and [email or web address].

## 2017-04-19 ENCOUNTER — Ambulatory Visit: Payer: Medicare Other

## 2017-04-27 ENCOUNTER — Other Ambulatory Visit: Payer: Self-pay | Admitting: Internal Medicine

## 2017-05-20 ENCOUNTER — Encounter: Payer: Medicare Other | Admitting: Internal Medicine

## 2017-06-04 ENCOUNTER — Other Ambulatory Visit: Payer: Self-pay | Admitting: Internal Medicine

## 2017-06-04 DIAGNOSIS — I4892 Unspecified atrial flutter: Secondary | ICD-10-CM

## 2017-06-04 NOTE — Telephone Encounter (Signed)
Per patient's caregiver/friend, patient still taking finasteride.  INR scheduled 06/10/17.

## 2017-06-04 NOTE — Telephone Encounter (Signed)
At last notation, patient was not taking finasteride.  Can you check to see if he is taking?   Also, he is overdue for an appointment with anticoag clinic - can you schedule him?   Thanks.

## 2017-06-10 ENCOUNTER — Other Ambulatory Visit: Payer: Self-pay

## 2017-06-10 ENCOUNTER — Ambulatory Visit (INDEPENDENT_AMBULATORY_CARE_PROVIDER_SITE_OTHER): Payer: Medicare Other | Admitting: Pharmacist

## 2017-06-10 ENCOUNTER — Encounter: Payer: Self-pay | Admitting: Internal Medicine

## 2017-06-10 ENCOUNTER — Ambulatory Visit (INDEPENDENT_AMBULATORY_CARE_PROVIDER_SITE_OTHER): Payer: Medicare Other | Admitting: Internal Medicine

## 2017-06-10 VITALS — BP 115/67 | HR 72 | Temp 98.4°F | Ht 68.0 in | Wt 172.1 lb

## 2017-06-10 DIAGNOSIS — Z952 Presence of prosthetic heart valve: Secondary | ICD-10-CM

## 2017-06-10 DIAGNOSIS — I4892 Unspecified atrial flutter: Secondary | ICD-10-CM | POA: Diagnosis not present

## 2017-06-10 DIAGNOSIS — Z23 Encounter for immunization: Secondary | ICD-10-CM

## 2017-06-10 DIAGNOSIS — I10 Essential (primary) hypertension: Secondary | ICD-10-CM

## 2017-06-10 DIAGNOSIS — Z79899 Other long term (current) drug therapy: Secondary | ICD-10-CM

## 2017-06-10 DIAGNOSIS — I5022 Chronic systolic (congestive) heart failure: Secondary | ICD-10-CM | POA: Diagnosis not present

## 2017-06-10 DIAGNOSIS — Z7901 Long term (current) use of anticoagulants: Secondary | ICD-10-CM | POA: Diagnosis present

## 2017-06-10 DIAGNOSIS — Z87891 Personal history of nicotine dependence: Secondary | ICD-10-CM

## 2017-06-10 DIAGNOSIS — I110 Hypertensive heart disease with heart failure: Secondary | ICD-10-CM

## 2017-06-10 LAB — POCT INR: INR: 2.7

## 2017-06-10 NOTE — Patient Instructions (Signed)
FOLLOW-UP INSTRUCTIONS When: in 6 months For: routine health care What to bring: currrent medications  We are checking some labs today.  I will let you know if anything is abnormal.  Please schedule a follow up appointment with Dr. Milas Kocher.

## 2017-06-10 NOTE — Assessment & Plan Note (Addendum)
Assessment: Stable problem.  Also follows with Dr. Gala Romney at HF clinic.  Denies orthopnea, PND, DOE.  Able to ride his bike about 6-8 miles per day without difficulty.  Plan: - advised continued f/u with HF - continue current management with Lasix, Coreg, Lisinopril, and Spironolactone - BMET ordered today  Addendum 1:56 PM: - Creatinine is deteriorated from last year (1.3 -->1.7).  I called patient and discussed with him and his friend at his request.  He was advised to hold his Lasix for the next 3 days, then resume dose every other day then extra 20mg  as needed. - Will arrange for outpatient lab visit in the next 1-2 weeks to reassess BMET

## 2017-06-10 NOTE — Progress Notes (Signed)
Anticoagulation Management [name] is a 71 y.o. male who reports to the clinic for monitoring of warfarin treatment.    Indication: Atrial Flutter (resolved) [148.92], irregular pulse [149.9], Long term (current) use of anticoagulants [Z79.01]. Duration: indefinite Supervising physician: Cephas Darby and [name]  Anticoagulation Clinic Visit History: Patient does not report signs/symptoms of bleeding or thromboembolism  Other recent changes: No diet, medications, lifestyle changes endorsed to me.   Anticoagulation Episode Summary    Current INR goal:   2.5-3.5  TTR:   67.2 % (4.7 y)  Next INR check:   07/15/2017  INR from last check:   2.7 (06/10/2017)  Weekly max warfarin dose:     Target end date:   Indefinite  [facility]  Preferred lab:     Send INR reminders to:   [facility] [address]   Indications   ATRIAL FLUTTER (Resolved) [I48.92] IRREGULAR PULSE [I49.9] Long term (current) use of anticoagulants (Resolved) [Z79.01]       Comments:         Anticoagulation Care Providers    Provider Role Specialty Phone number   [name], MD  Cardiology [phone]     No Known Allergies Medication Sig  acetaminophen (TYLENOL) 500 MG tablet Take 1,000 mg by mouth every 6 (six) hours as needed for mild pain.  carvedilol (COREG) 6.25 MG tablet TAKE 1 TABLET BY MOUTH TWICE A DAY WITH A MEAL  finasteride (PROSCAR) 5 MG tablet TAKE 1 TABLET BY MOUTH EVERY DAY  furosemide (LASIX) 20 MG tablet TAKE 1 TABLET (20 MG TOTAL) BY MOUTH DAILY.  KLOR-CON M20 20 MEQ tablet TAKE 1 TABLET BY MOUTH EVERY DAY  lisinopril (PRINIVIL,ZESTRIL) 2.5 MG tablet Take 1 tablet (2.5 mg total) by mouth 2 (two) times daily.  spironolactone (ALDACTONE) 25 MG tablet TAKE 1 TABLET BY MOUTH EVERY DAY  warfarin (COUMADIN) 5 MG tablet Take 1 tablet by mouth once-daily at 6PM all days of week---except on Mondays, Wednesdays and Fridays--take only 1/2  tablet of your 5mg  peach-colored warfarin tablets on Mondays, Wednesdays and Fridays.   Past Medical History:  Diagnosis Date  . Atrial fibrillation (HCC)    post op.  s/p dc-cv 04/2008. previously on amiodarone  . Atrial flutter (HCC)    atypical  . AV block, 1st degree    .450 msec--progressive  . 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  a- Echo 07/2008 Ef 55% mild peroprosthetic MVR with High transmitral gradient (mean 14).  b- normal coronaries by cath 12/2007  . BPH (benign prostatic hyperplasia)   . CHF (congestive heart failure) (HCC)    EF 35-40 % 2011 due to valvular disease and diastolic dysfunction  . Chronic back pain   . CKD (chronic kidney disease), stage III (HCC)   . Dysphagia    with normal barium swallow 09/2008  . Endocarditis   . GERD (gastroesophageal reflux disease)   . Heavy alcohol use    history  . History of GI bleed   . Hypertension   . IV drug abuse (HCC)    history of  . Lower GI bleed 01/2016  . Pacemaker 2010   Social History   Socioeconomic History  . Marital status: Divorced    Spouse name: Not on file  . Number of children: 9  . Years of education: Not on file  . Highest education level: Not on file  Social Needs  .  Financial resource strain: Not on file  . Food insecurity - worry: Not on file  . Food insecurity - inability: Not on file  . Transportation needs - medical: Not on file  . Transportation needs - non-medical: Not on file  Occupational History  . Occupation: retired  Tobacco Use  . Smoking status: Former Smoker    Years: 0.00    Types: Cigarettes  . Smokeless tobacco: Never Used  Substance and Sexual Activity  . Alcohol use: No    Alcohol/week: 0.0 oz  . Drug use: No  . Sexual activity: Not on file  Other Topics Concern  . Not on file  Social History Narrative   Exercise everyday riding a bike for 1 hour   Family History  Problem Relation Age of Onset  . Coronary artery  disease Mother    ASSESSMENT Lab Results  Component Value Date   INR 2.7 06/10/2017   INR 4.20 03/29/2017   INR 2.40 03/01/2017   Anticoagulation Dosing: Description   Patient instructed to take 1 tablet by mouth once-daily at 6PM all days of week---except on Mondays, Wednesdays and Fridays--take only 1/2 tablet of your 5mg  peach-colored warfarin tablets on Mondays, Wednesdays and Fridays.      INR today: Therapeutic  PLAN Weekly dose was unchanged   There are no Patient Instructions on file for this visit. Patient advised to contact clinic or seek medical attention if signs/symptoms of bleeding or thromboembolism occur.  Patient verbalized understanding by repeating back information and was advised to contact me if further medication-related questions arise. Patient was also provided an information handout.  Follow-up Return in about 6 weeks (around 07/22/2017).  [name]  15 minutes spent face-to-face with the patient during the encounter. 50% of time spent on education. 50% of time was spent on assessment and plan.

## 2017-06-10 NOTE — Assessment & Plan Note (Signed)
BP Readings from Last 3 Encounters:  06/10/17 115/67  11/26/16 109/76  07/30/16 112/64   Assessment: BP stable today on current regimen that includes lisinopril, coreg, and spironolactone.  Plan: - no changes to regimen today

## 2017-06-10 NOTE — Patient Instructions (Signed)
Patient educated about medication as defined in this encounter and verbalized understanding by repeating back instructions provided.   

## 2017-06-10 NOTE — Progress Notes (Addendum)
   CC: here for f/u HTN, CHF  HPI:  [name] is a 71 y.o. man with a past medical history listed below here today for follow up of his HTN and CHF.   For details of today's visit and the status of his chronic medical issues please refer to the assessment and plan.   Past Medical History:  Diagnosis Date  . Atrial fibrillation (HCC)    post op.  s/p dc-cv 04/2008. previously on amiodarone  . Atrial flutter (HCC)    atypical  . AV block, 1st degree    .450 msec--progressive  . 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  a- Echo 07/2008 Ef 55% mild peroprosthetic MVR with High transmitral gradient (mean 14).  b- normal coronaries by cath 12/2007  . BPH (benign prostatic hyperplasia)   . CHF (congestive heart failure) (HCC)    EF 35-40 % 2011 due to valvular disease and diastolic dysfunction  . Chronic back pain   . CKD (chronic kidney disease), stage III (HCC)   . Dysphagia    with normal barium swallow 09/2008  . Endocarditis   . GERD (gastroesophageal reflux disease)   . Heavy alcohol use    history  . History of GI bleed   . Hypertension   . IV drug abuse (HCC)    history of  . Lower GI bleed 01/2016  . Pacemaker 2010   Review of Systems:  Please see pertinent ROS reviewed in HPI and problem based charting.   Physical Exam:  Vitals:   06/10/17 1353  BP: 115/67  Pulse: 72  Temp: 98.4 F (36.9 C)  TempSrc: Oral  SpO2: 99%  Weight: 172 lb 1.6 oz (78.1 kg)  Height: 5\' 8"  (1.727 m)   Gen: NAD HEENT: NCAT, EOMI Cardiac: RRR, + click of mechanical valve Pulm: CTAB Ext: no edema Neuro A&O x 3, no focal deficits  Assessment & Plan:   See Encounters Tab for problem based charting.  Patient discussed with Dr. Heide Hubbard.  Chronic systolic heart failure (HCC) Assessment: Stable problem.  Also follows with Dr. Gala Hubbard at HF clinic.  Denies orthopnea, PND, DOE.  Able to ride his bike about 6-8 miles per day without  difficulty.  Plan: - advised continued f/u with HF - continue current management with Lasix, Coreg, Lisinopril, and Spironolactone - BMET ordered today  Addendum 1:56 PM: - Creatinine is deteriorated from last year (1.3 -->1.7).  I called patient and discussed with him and his friend at his request.  He was advised to hold his Lasix for the next 3 days, then resume dose every other day then extra 20mg  as needed. - Will arrange for outpatient lab visit in the next 1-2 weeks to reassess BMET  HYPERTENSION, BENIGN BP Readings from Last 3 Encounters:  06/10/17 115/67  11/26/16 109/76  07/30/16 112/64   Assessment: BP stable today on current regimen that includes lisinopril, coreg, and spironolactone.  Plan: - no changes to regimen today

## 2017-06-11 ENCOUNTER — Other Ambulatory Visit: Payer: Self-pay | Admitting: *Deleted

## 2017-06-11 DIAGNOSIS — I4892 Unspecified atrial flutter: Secondary | ICD-10-CM

## 2017-06-11 LAB — BMP8+ANION GAP
ANION GAP: 15 mmol/L (ref 10.0–18.0)
BUN/Creatinine Ratio: 16 (ref 10–24)
BUN: 28 mg/dL — AB (ref 8–27)
CALCIUM: 9.4 mg/dL (ref 8.6–10.2)
CHLORIDE: 103 mmol/L (ref 96–106)
CO2: 20 mmol/L (ref 20–29)
CREATININE: 1.72 mg/dL — AB (ref 0.76–1.27)
GFR calc Af Amer: 45 mL/min/{1.73_m2} — ABNORMAL LOW (ref >59)
GFR calc non Af Amer: 39 mL/min/{1.73_m2} — ABNORMAL LOW (ref >59)
GLUCOSE: 75 mg/dL (ref 65–99)
Potassium: 5.1 mmol/L (ref 3.5–5.2)
Sodium: 138 mmol/L (ref 134–144)

## 2017-06-11 NOTE — Addendum Note (Signed)
Addended by: Kathlynn Grate on: 06/11/2017 02:39 PM   Modules accepted: Orders

## 2017-06-11 NOTE — Progress Notes (Signed)
Reviewed thx DrG 

## 2017-06-14 MED ORDER — WARFARIN SODIUM 5 MG PO TABS: ORAL_TABLET | ORAL | 2 refills | Status: DC

## 2017-07-06 ENCOUNTER — Other Ambulatory Visit: Payer: Self-pay | Admitting: Internal Medicine

## 2017-07-06 DIAGNOSIS — I4892 Unspecified atrial flutter: Secondary | ICD-10-CM

## 2017-07-07 ENCOUNTER — Other Ambulatory Visit (INDEPENDENT_AMBULATORY_CARE_PROVIDER_SITE_OTHER): Payer: Medicare Other

## 2017-07-07 DIAGNOSIS — I5022 Chronic systolic (congestive) heart failure: Secondary | ICD-10-CM

## 2017-07-07 NOTE — Progress Notes (Signed)
Internal Medicine Clinic Attending  Case discussed with Dr. Wallace at the time of the visit.  We reviewed the resident's history and exam and pertinent patient test results.  I agree with the assessment, diagnosis, and plan of care documented in the resident's note.  

## 2017-07-08 LAB — BMP8+ANION GAP
ANION GAP: 14 mmol/L (ref 10.0–18.0)
BUN/Creatinine Ratio: 22 (ref 10–24)
BUN: 38 mg/dL — AB (ref 8–27)
CO2: 19 mmol/L — ABNORMAL LOW (ref 20–29)
Calcium: 9.4 mg/dL (ref 8.6–10.2)
Chloride: 104 mmol/L (ref 96–106)
Creatinine, Ser: 1.7 mg/dL — ABNORMAL HIGH (ref 0.76–1.27)
GFR calc Af Amer: 45 mL/min/{1.73_m2} — ABNORMAL LOW (ref >59)
GFR, EST NON AFRICAN AMERICAN: 39 mL/min/{1.73_m2} — AB (ref >59)
Glucose: 86 mg/dL (ref 65–99)
POTASSIUM: 4.8 mmol/L (ref 3.5–5.2)
Sodium: 137 mmol/L (ref 134–144)

## 2017-07-15 ENCOUNTER — Ambulatory Visit (INDEPENDENT_AMBULATORY_CARE_PROVIDER_SITE_OTHER): Payer: Medicare Other | Admitting: Pharmacist

## 2017-07-15 DIAGNOSIS — Z952 Presence of prosthetic heart valve: Secondary | ICD-10-CM | POA: Diagnosis not present

## 2017-07-15 DIAGNOSIS — Z7901 Long term (current) use of anticoagulants: Secondary | ICD-10-CM

## 2017-07-15 DIAGNOSIS — I4892 Unspecified atrial flutter: Secondary | ICD-10-CM

## 2017-07-15 LAB — POCT INR: INR: 1.8

## 2017-07-15 MED ORDER — ENOXAPARIN SODIUM 80 MG/0.8ML ~~LOC~~ SOLN: 80.0000 mg | SUBCUTANEOUS | 0 refills | Status: DC

## 2017-07-15 NOTE — Progress Notes (Addendum)
Anticoagulation Management [name] a 72 y.o.malewho reports to the clinic for monitoring of warfarintreatment.   Indication:Atrial Flutter (resolved) [148.92], irregular pulse [149.9], Long term (current) use of anticoagulants [Z79.01], H/O mitral valve replacement with mechanical valve, CHADSVASC 3.2, HASBLED 3 Duration:indefinite Supervising physician:James Granfortuna and [name]  Anticoagulation Clinic Visit History: Patient does not report signs/symptoms of bleeding or thromboembolism. Patient may have inadvertently taken a lower dose this past week due to alternating between 1 full tablet and 1/2 tablet rather than only taking 1/2 tablet on Mondays, Wednesdays, and Fridays.  Anticoagulation Episode Summary    Current INR goal:   2.5-3.5  TTR:   66.3 % (4.8 y)  Next INR check:   07/20/2017  INR from last check:   1.8! (07/15/2017)  Weekly max warfarin dose:     Target end date:   Indefinite  [facility]   [facility]  Preferred lab:     Send INR reminders to:   [facility] [address]   Indications   ATRIAL FLUTTER (Resolved) [I48.92] IRREGULAR PULSE [I49.9] Long term (current) use of anticoagulants (Resolved) [Z79.01]       Comments:         Anticoagulation Care Providers    Provider Role Specialty Phone number   [name], MD  Cardiology [phone]     No Known Allergies Medication Sig  acetaminophen (TYLENOL) 500 MG tablet Take 1,000 mg by mouth every 6 (six) hours as needed for mild pain.  carvedilol (COREG) 6.25 MG tablet TAKE 1 TABLET BY MOUTH TWICE A DAY WITH A MEAL  enoxaparin (LOVENOX) 80 MG/0.8ML injection Inject 0.8 mLs (80 mg total) into the skin daily.  finasteride (PROSCAR) 5 MG tablet TAKE 1 TABLET BY MOUTH EVERY DAY  furosemide (LASIX) 20 MG tablet TAKE 1 TABLET (20 MG TOTAL) BY MOUTH DAILY.  KLOR-CON M20 20 MEQ tablet TAKE 1 TABLET BY MOUTH EVERY DAY  lisinopril (PRINIVIL,ZESTRIL) 2.5 MG tablet Take  1 tablet (2.5 mg total) by mouth 2 (two) times daily.  spironolactone (ALDACTONE) 25 MG tablet TAKE 1 TABLET BY MOUTH EVERY DAY  warfarin (COUMADIN) 5 MG tablet 1 tablet by mouth daily at 6PM all days of week except on Mondays, Wednesdays and Fridays--take only 1/2 tablet of your 5mg  warfarin   Past Medical History:  Diagnosis Date  . Atrial fibrillation (HCC)    post op.  s/p dc-cv 04/2008. previously on amiodarone  . Atrial flutter (HCC)    atypical  . AV block, 1st degree    .450 msec--progressive  . 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  a- Echo 07/2008 Ef 55% mild peroprosthetic MVR with High transmitral gradient (mean 14).  b- normal coronaries by cath 12/2007  . BPH (benign prostatic hyperplasia)   . CHF (congestive heart failure) (HCC)    EF 35-40 % 2011 due to valvular disease and diastolic dysfunction  . Chronic back pain   . CKD (chronic kidney disease), stage III (HCC)   . Dysphagia    with normal barium swallow 09/2008  . Endocarditis   . GERD (gastroesophageal reflux disease)   . Heavy alcohol use    history  . History of GI bleed   . Hypertension   . IV drug abuse (HCC)    history of  . Lower GI bleed 01/2016  . Pacemaker 2010   Social History   Socioeconomic History  . Marital status: Divorced  Spouse name: Not on file  . Number of children: 9  . Years of education: Not on file  . Highest education level: Not on file  Social Needs  . Financial resource strain: Not on file  . Food insecurity - worry: Not on file  . Food insecurity - inability: Not on file  . Transportation needs - medical: Not on file  . Transportation needs - non-medical: Not on file  Occupational History  . Occupation: retired  Tobacco Use  . Smoking status: Former Smoker    Years: 0.00    Types: Cigarettes  . Smokeless tobacco: Never Used  Substance and Sexual Activity  . Alcohol use: No    Alcohol/week: 0.0 oz  . Drug use: No  . Sexual  activity: Not on file  Other Topics Concern  . Not on file  Social History Narrative   Exercise everyday riding a bike for 1 hour   Family History  Problem Relation Age of Onset  . Coronary artery disease Mother    ASSESSMENT Recent Results: Lab Results  Component Value Date   INR 1.8 07/15/2017   INR 2.7 06/10/2017   INR 4.20 03/29/2017   Anticoagulation Dosing: Description   Patient instructed to take 1 tablet by mouth once-daily at 6PM all days of week---except on Mondays, Wednesdays and Fridays--take only 1/2 tablet of your 5mg  peach-colored warfarin tablets on Mondays, Wednesdays and Fridays.      INR today: Subtherapeutic  PLAN Start enoxaparin 80 mg daily, weekly dose increased by 9%, return to clinic in 5 days. Provided injection education on enoxaparin and patient repeated back for understanding.  Patient Instructions  Patient educated about medication as defined in this encounter and verbalized understanding by repeating back instructions provided.   Patient advised to contact clinic or seek medical attention if signs/symptoms of bleeding or thromboembolism occur.  Patient verbalized understanding by repeating back information and was advised to contact me if further medication-related questions arise. Patient was also provided an information handout.  Follow-up Return in about 5 days (around 07/20/2017).  [name]  30 minutes spent face-to-face with the patient during the encounter. 75% of time spent on education. 15% of time was spent on assessment and plan.

## 2017-07-15 NOTE — Patient Instructions (Signed)
Patient educated about medication as defined in this encounter and verbalized understanding by repeating back instructions provided.   

## 2017-07-16 NOTE — Progress Notes (Signed)
INTERNAL MEDICINE TEACHING ATTENDING ADDENDUM - Earl Lagos M.D  Duration- indefinite, Indication- aflutter, INR- sub therapeutic. Agree with pharmacy recommendations as outlined in their note.

## 2017-07-20 ENCOUNTER — Ambulatory Visit (INDEPENDENT_AMBULATORY_CARE_PROVIDER_SITE_OTHER): Payer: Medicare Other | Admitting: Pharmacist

## 2017-07-20 DIAGNOSIS — Z7901 Long term (current) use of anticoagulants: Secondary | ICD-10-CM

## 2017-07-20 DIAGNOSIS — Z5181 Encounter for therapeutic drug level monitoring: Secondary | ICD-10-CM | POA: Diagnosis not present

## 2017-07-20 DIAGNOSIS — I4892 Unspecified atrial flutter: Secondary | ICD-10-CM | POA: Diagnosis not present

## 2017-07-20 DIAGNOSIS — Z952 Presence of prosthetic heart valve: Secondary | ICD-10-CM | POA: Diagnosis not present

## 2017-07-20 LAB — POCT INR: INR: 2.1

## 2017-07-20 NOTE — Progress Notes (Signed)
Anticoagulation Management Richard Voorhis Sellarsis a 72 y.o.malewho reports to the clinic for monitoring of warfarintreatment.   Indication:Atrial Flutter (resolved) [148.92], irregular pulse [149.9], Long term (current) use of anticoagulants [Z79.01], H/O mitral valve replacement with mechanical valve, CHADSVASC 3.2, HASBLED 3 Duration:indefinite Supervising physician:[name]  Anticoagulation Clinic Visit History: Patient does not report signs/symptoms of bleeding or thromboembolism  Anticoagulation Episode Summary    Current INR goal:   2.5-3.5  TTR:   66.1 % (4.8 y)  Next INR check:   07/27/2017  INR from last check:   2.1! (07/20/2017)  Weekly max warfarin dose:     Target end date:   Indefinite  INR check location:   [facility]  Preferred lab:     Send INR reminders to:   [facility] [address]   Indications   IRREGULAR PULSE [I49.9] Long term (current) use of anticoagulants (Resolved) [Z79.01] ATRIAL FLUTTER (Resolved) [I48.92] H/O mitral valve replacement with mechanical valve [Z95.2]       Comments:         Anticoagulation Care Providers    Provider Role Specialty Phone number   Richard Patty, MD  Cardiology [phone]     No Known Allergies Medication Sig  acetaminophen (TYLENOL) 500 MG tablet Take 1,000 mg by mouth every 6 (six) hours as needed for mild pain.  carvedilol (COREG) 6.25 MG tablet TAKE 1 TABLET BY MOUTH TWICE A DAY WITH A MEAL  enoxaparin (LOVENOX) 80 MG/0.8ML injection Inject 0.8 mLs (80 mg total) into the skin daily.  finasteride (PROSCAR) 5 MG tablet TAKE 1 TABLET BY MOUTH EVERY DAY  furosemide (LASIX) 20 MG tablet TAKE 1 TABLET (20 MG TOTAL) BY MOUTH DAILY.  KLOR-CON M20 20 MEQ tablet TAKE 1 TABLET BY MOUTH EVERY DAY  lisinopril (PRINIVIL,ZESTRIL) 2.5 MG tablet Take 1 tablet (2.5 mg total) by mouth 2 (two) times daily.  spironolactone (ALDACTONE) 25 MG tablet TAKE 1 TABLET BY MOUTH EVERY DAY  warfarin (COUMADIN) 5 MG  tablet 1 tablet by mouth daily at 6PM all days of week except on Mondays, Wednesdays and Fridays--take only 1/2 tablet of your 5mg  warfarin   Past Medical History:  Diagnosis Date  . Atrial fibrillation (HCC)    post op.  s/p dc-cv 04/2008. previously on amiodarone  . Atrial flutter (HCC)    atypical  . AV block, 1st degree    .450 msec--progressive  . 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  a- Echo 07/2008 Ef 55% mild peroprosthetic MVR with High transmitral gradient (mean 14).  b- normal coronaries by cath 12/2007  . BPH (benign prostatic hyperplasia)   . CHF (congestive heart failure) (HCC)    EF 35-40 % 2011 due to valvular disease and diastolic dysfunction  . Chronic back pain   . CKD (chronic kidney disease), stage III (HCC)   . Dysphagia    with normal barium swallow 09/2008  . Endocarditis   . GERD (gastroesophageal reflux disease)   . Heavy alcohol use    history  . History of GI bleed   . Hypertension   . IV drug abuse (HCC)    history of  . Lower GI bleed 01/2016  . Pacemaker 2010   Social History   Socioeconomic History  . Marital status: Divorced    Spouse name: Not on file  . Number of children: 9  . Years of education: Not on file  . Highest education level: Not on file  Social Needs  . Financial resource strain: Not on file  . Food insecurity - worry: Not on file  . Food insecurity - inability: Not on file  . Transportation needs - medical: Not on file  . Transportation needs - non-medical: Not on file  Occupational History  . Occupation: retired  Tobacco Use  . Smoking status: Former Smoker    Years: 0.00    Types: Cigarettes  . Smokeless tobacco: Never Used  Substance and Sexual Activity  . Alcohol use: No    Alcohol/week: 0.0 oz  . Drug use: No  . Sexual activity: Not on file  Other Topics Concern  . Not on file  Social History Narrative   Exercise everyday riding a bike for 1 hour   Family History   Problem Relation Age of Onset  . Coronary artery disease Mother    ASSESSMENT Recent Results: Lab Results  Component Value Date   INR 2.1 07/20/2017   INR 1.8 07/15/2017   INR 2.7 06/10/2017   Anticoagulation Dosing: Description   Patient instructed to take 1 tablet by mouth once-daily at 6PM all days of week---except on Mondays, Wednesdays and Fridays--take only 1/2 tablet of your 5mg  peach-colored warfarin tablets on Mondays, Wednesdays and Fridays.      INR today: Subtherapeutic  PLAN Increase warfarin dose by 16% to 1 tablet (5 mg) daily. D/c enoxaparin tomorrow, return to clinic 1 week.  Patient Instructions  Patient educated about medication as defined in this encounter and verbalized understanding by repeating back instructions provided.   Patient advised to contact clinic or seek medical attention if signs/symptoms of bleeding or thromboembolism occur.  Patient verbalized understanding by repeating back information and was advised to contact me if further medication-related questions arise. Patient was also provided an information handout.  Follow-up Return in about 1 week (around 07/27/2017).  [name]

## 2017-07-20 NOTE — Patient Instructions (Signed)
Patient educated about medication as defined in this encounter and verbalized understanding by repeating back instructions provided.   

## 2017-07-22 NOTE — Progress Notes (Signed)
INTERNAL MEDICINE TEACHING ATTENDING ADDENDUM - Gust Rung, DO Duration- indefinate, Indication- a flutter, INR-  Lab Results  Component Value Date   INR 2.1 07/20/2017  . Agree with pharmacy recommendations as outlined in their note.

## 2017-07-27 ENCOUNTER — Other Ambulatory Visit: Payer: Self-pay | Admitting: Pharmacist

## 2017-07-27 ENCOUNTER — Ambulatory Visit (INDEPENDENT_AMBULATORY_CARE_PROVIDER_SITE_OTHER): Payer: Medicare Other | Admitting: Pharmacist

## 2017-07-27 DIAGNOSIS — E876 Hypokalemia: Secondary | ICD-10-CM

## 2017-07-27 DIAGNOSIS — Z952 Presence of prosthetic heart valve: Secondary | ICD-10-CM | POA: Diagnosis present

## 2017-07-27 DIAGNOSIS — Z7901 Long term (current) use of anticoagulants: Secondary | ICD-10-CM

## 2017-07-27 DIAGNOSIS — N401 Enlarged prostate with lower urinary tract symptoms: Secondary | ICD-10-CM

## 2017-07-27 DIAGNOSIS — Z5181 Encounter for therapeutic drug level monitoring: Secondary | ICD-10-CM | POA: Diagnosis not present

## 2017-07-27 LAB — POCT INR: INR: 2.7

## 2017-07-27 MED ORDER — POTASSIUM CHLORIDE CRYS ER 20 MEQ PO TBCR: 20.0000 meq | EXTENDED_RELEASE_TABLET | Freq: Every day | ORAL | 1 refills | Status: DC

## 2017-07-27 MED ORDER — FINASTERIDE 5 MG PO TABS: 5.0000 mg | ORAL_TABLET | Freq: Every day | ORAL | 5 refills | Status: DC

## 2017-07-27 NOTE — Patient Instructions (Signed)
Patient educated about medication as defined in this encounter and verbalized understanding by repeating back instructions provided.   

## 2017-07-27 NOTE — Progress Notes (Signed)
Indication: s/p mechanical mitral valve Duration: Indefinite INR: At target.  Dr. Elmyra Ricks assessment and plan were reviewed and I agree with her documentation.

## 2017-07-27 NOTE — Progress Notes (Signed)
Anticoagulation Management Richard Patriarca Sellarsis a 72 y.o.malewho reports to the clinic for monitoring of warfarintreatment.   Indication:Atrial Flutter (resolved) [148.92], irregular pulse [149.9], Long term (current) use of anticoagulants [Z79.01],H/O mitral valve replacement with mechanical valve, CHADSVASC 3.2, HASBLED 3 Duration:indefinite Supervising physician:Lawrence Klima  Anticoagulation Clinic Visit History: Patient does not report signs/symptoms of bleeding or thromboembolism  Anticoagulation Episode Summary    Current INR goal:   2.5-3.5  TTR:   66.0 % (4.9 y)  Next INR check:   08/30/2017  INR from last check:   2.7 (07/27/2017)  Weekly max warfarin dose:     Target end date:   Indefinite  INR check location:   [facility]  Preferred lab:     Send INR reminders to:   [facility] [address]   Indications   IRREGULAR PULSE [I49.9] Long term (current) use of anticoagulants (Resolved) [Z79.01] ATRIAL FLUTTER (Resolved) [I48.92] H/O mitral valve replacement with mechanical valve [Z95.2]       Comments:         Anticoagulation Care Providers    Provider Role Specialty Phone number   [name], MD  Cardiology [phone]     No Known Allergies Medication Sig  acetaminophen (TYLENOL) 500 MG tablet Take 1,000 mg by mouth every 6 (six) hours as needed for mild pain.  carvedilol (COREG) 6.25 MG tablet TAKE 1 TABLET BY MOUTH TWICE A DAY WITH A MEAL  enoxaparin (LOVENOX) 80 MG/0.8ML injection Inject 0.8 mLs (80 mg total) into the skin daily.  finasteride (PROSCAR) 5 MG tablet TAKE 1 TABLET BY MOUTH EVERY DAY  furosemide (LASIX) 20 MG tablet TAKE 1 TABLET (20 MG TOTAL) BY MOUTH DAILY.  KLOR-CON M20 20 MEQ tablet TAKE 1 TABLET BY MOUTH EVERY DAY  lisinopril (PRINIVIL,ZESTRIL) 2.5 MG tablet Take 1 tablet (2.5 mg total) by mouth 2 (two) times daily.  spironolactone (ALDACTONE) 25 MG tablet TAKE 1 TABLET BY MOUTH EVERY DAY  warfarin (COUMADIN) 5 MG  tablet 1 tablet by mouth daily at 6PM all days of week except on Mondays, Wednesdays and Fridays--take only 1/2 tablet of your 5mg  warfarin   Past Medical History:  Diagnosis Date  . Atrial fibrillation (HCC)    post op.  s/p dc-cv 04/2008. previously on amiodarone  . Atrial flutter (HCC)    atypical  . AV block, 1st degree    .450 msec--progressive  . 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  a- Echo 07/2008 Ef 55% mild peroprosthetic MVR with High transmitral gradient (mean 14).  b- normal coronaries by cath 12/2007  . BPH (benign prostatic hyperplasia)   . CHF (congestive heart failure) (HCC)    EF 35-40 % 2011 due to valvular disease and diastolic dysfunction  . Chronic back pain   . CKD (chronic kidney disease), stage III (HCC)   . Dysphagia    with normal barium swallow 09/2008  . Endocarditis   . GERD (gastroesophageal reflux disease)   . Heavy alcohol use    history  . History of GI bleed   . Hypertension   . IV drug abuse (HCC)    history of  . Lower GI bleed 01/2016  . Pacemaker 2010   Social History   Socioeconomic History  . Marital status: Divorced    Spouse name: Not on file  . Number of children: 9  . Years of education: Not on file  . Highest education level: Not on file  Social Needs  . Financial resource strain: Not on file  . Food insecurity - worry: Not on file  . Food insecurity - inability: Not on file  . Transportation needs - medical: Not on file  . Transportation needs - non-medical: Not on file  Occupational History  . Occupation: retired  Tobacco Use  . Smoking status: Former Smoker    Years: 0.00    Types: Cigarettes  . Smokeless tobacco: Never Used  Substance and Sexual Activity  . Alcohol use: No    Alcohol/week: 0.0 oz  . Drug use: No  . Sexual activity: Not on file  Other Topics Concern  . Not on file  Social History Narrative   Exercise everyday riding a bike for 1 hour   Family History   Problem Relation Age of Onset  . Coronary artery disease Mother    ASSESSMENT Recent Results: Lab Results  Component Value Date   INR 2.7 07/27/2017   INR 2.1 07/20/2017   INR 1.8 07/15/2017   Anticoagulation Dosing: Description   Patient instructed to take 1 tablet by mouth once-daily at 6PM all days of week---except on Mondays, Wednesdays and Fridays--take only 1/2 tablet of your 5mg  peach-colored warfarin tablets on Mondays, Wednesdays and Fridays.      INR today: Therapeutic  PLAN Weekly dose was unchanged   There are no Patient Instructions on file for this visit. Patient advised to contact clinic or seek medical attention if signs/symptoms of bleeding or thromboembolism occur.  Patient verbalized understanding by repeating back information and was advised to contact me if further medication-related questions arise. Patient was also provided an information handout.  Follow-up Return in about 5 weeks (around 08/30/2017).  [name]  15 minutes spent face-to-face with the patient during the encounter. 50% of time spent on education. 50% of time was spent on assessment and plan.

## 2017-08-07 ENCOUNTER — Other Ambulatory Visit (HOSPITAL_COMMUNITY): Payer: Self-pay | Admitting: Internal Medicine

## 2017-08-07 DIAGNOSIS — I5022 Chronic systolic (congestive) heart failure: Secondary | ICD-10-CM

## 2017-09-08 ENCOUNTER — Encounter (INDEPENDENT_AMBULATORY_CARE_PROVIDER_SITE_OTHER): Payer: Self-pay

## 2017-09-08 ENCOUNTER — Ambulatory Visit (INDEPENDENT_AMBULATORY_CARE_PROVIDER_SITE_OTHER): Payer: Medicare Other | Admitting: Internal Medicine

## 2017-09-08 VITALS — BP 117/78 | HR 73 | Temp 98.1°F | Wt 171.0 lb

## 2017-09-08 DIAGNOSIS — Z5181 Encounter for therapeutic drug level monitoring: Secondary | ICD-10-CM

## 2017-09-08 DIAGNOSIS — I509 Heart failure, unspecified: Secondary | ICD-10-CM

## 2017-09-08 DIAGNOSIS — R011 Cardiac murmur, unspecified: Secondary | ICD-10-CM | POA: Diagnosis not present

## 2017-09-08 DIAGNOSIS — N182 Chronic kidney disease, stage 2 (mild): Secondary | ICD-10-CM | POA: Diagnosis not present

## 2017-09-08 DIAGNOSIS — R350 Frequency of micturition: Secondary | ICD-10-CM | POA: Diagnosis not present

## 2017-09-08 DIAGNOSIS — Z8679 Personal history of other diseases of the circulatory system: Secondary | ICD-10-CM | POA: Diagnosis not present

## 2017-09-08 DIAGNOSIS — Z952 Presence of prosthetic heart valve: Secondary | ICD-10-CM

## 2017-09-08 DIAGNOSIS — R413 Other amnesia: Secondary | ICD-10-CM | POA: Diagnosis not present

## 2017-09-08 DIAGNOSIS — Z87891 Personal history of nicotine dependence: Secondary | ICD-10-CM | POA: Diagnosis not present

## 2017-09-08 DIAGNOSIS — R791 Abnormal coagulation profile: Secondary | ICD-10-CM | POA: Diagnosis not present

## 2017-09-08 DIAGNOSIS — Z7901 Long term (current) use of anticoagulants: Secondary | ICD-10-CM | POA: Diagnosis not present

## 2017-09-08 DIAGNOSIS — R3911 Hesitancy of micturition: Secondary | ICD-10-CM | POA: Diagnosis not present

## 2017-09-08 DIAGNOSIS — N401 Enlarged prostate with lower urinary tract symptoms: Secondary | ICD-10-CM | POA: Diagnosis not present

## 2017-09-08 DIAGNOSIS — R319 Hematuria, unspecified: Secondary | ICD-10-CM | POA: Insufficient documentation

## 2017-09-08 DIAGNOSIS — R351 Nocturia: Secondary | ICD-10-CM

## 2017-09-08 LAB — POCT INR: INR: 3.8

## 2017-09-08 NOTE — Assessment & Plan Note (Signed)
Assessment: When queried he reports that his memory is about the same.  He has not noticed any significant changes.  He presents alone today so nobody is here to corroborate this.  Plan: - MMSE today is 28/30 (was 27/30 last year)  - PHQ is 0

## 2017-09-08 NOTE — Assessment & Plan Note (Addendum)
Assessment: He has CKD 2-3 over the past several years with creatinine levels ranging from 1.3-2.0.  Creatinine over past several months has been about 1.7.  He continues on all his medications for heart failure including furosemide, lisinopril, and spironolactone.  Weights have been stable.  Plan: - check BMET today - if creatinine has deteriorated, will consider ultrasound to rule out obstruction given worsening BPH symptoms  Addendum 9:16 AM 09/09/2017: - renal function has improved from 1.7 to 1.4

## 2017-09-08 NOTE — Addendum Note (Signed)
Addended by: Mliss Fritz on: 09/08/2017 10:39 AM   Modules accepted: Orders

## 2017-09-08 NOTE — Assessment & Plan Note (Signed)
Assessment: He is experiencing LUTS today that has worsened over the past few months.  His primary complaint is nocturia and he reports going frequently about 5-6 times per night.  Also has frequency and hesitant stream.  He was initially started on finasteride in May 2017 instead of an alpha blocker due to his BP being borderline.    Plan: - we will check a PSA today as it was last checked in 2013 given his worsening LUTS - if PSA abnormal, will plan urology evaluation - if PSA normal, will consider change in therapy from finasteride to tamsulosin with close follow up of his blood pressure

## 2017-09-08 NOTE — Progress Notes (Signed)
Anticoagulation Management Richard Fullwood Sellarsis a 72 y.o.malewho reports to the clinic for monitoring of warfarintreatment.   Indication:Atrial Flutter (resolved) [148.92], irregular pulse [149.9], Long term (current) use of anticoagulants [Z79.01],H/O mitral valve replacement with mechanical valve Duration:indefinite Supervising physician:Nischal Narendra  Anticoagulation Clinic Visit History: Patientdoes notreport signs/symptoms of bleeding or thromboembolism  Anticoagulation Episode Summary    Current INR goal:   2.5-3.5  TTR:   66.2 % (5 y)  Next INR check:   09/22/2017  INR from last check:   3.8! (09/08/2017)  Weekly max warfarin dose:     Target end date:   Indefinite  [facility] check [facility]   [facility]  Preferred lab:     Send INR reminders to:   [facility]   Indications   IRREGULAR PULSE [I49.9] Long term (current) use of anticoagulants (Resolved) [Z79.01] ATRIAL FLUTTER (Resolved) [I48.92] H/O mitral valve replacement with mechanical valve [Z95.2]       Comments:         Anticoagulation Care Providers    Provider Role Specialty Phone number   [name], MD  Cardiology [phone]     No Known Allergies Medication Sig  acetaminophen (TYLENOL) 500 MG tablet Take 1,000 mg by mouth every 6 (six) hours as needed for mild pain.  carvedilol (COREG) 6.25 MG tablet Take 1 tablet (6.25 mg total) by mouth 2 (two) times daily with a meal. Needs office visit for further refills [phone]  finasteride (PROSCAR) 5 MG tablet Take 1 tablet (5 mg total) by mouth daily.  furosemide (LASIX) 20 MG tablet TAKE 1 TABLET (20 MG TOTAL) BY MOUTH DAILY.  lisinopril (PRINIVIL,ZESTRIL) 2.5 MG tablet Take 1 tablet (2.5 mg total) by mouth 2 (two) times daily.  potassium chloride SA (KLOR-CON M20) 20 MEQ tablet Take 1 tablet (20 mEq total) by mouth daily.  spironolactone (ALDACTONE) 25 MG tablet TAKE 1 TABLET BY MOUTH EVERY DAY  warfarin (COUMADIN) 5  MG tablet 1 tablet by mouth daily at 6PM all days of week except on Mondays, Wednesdays and Fridays--take only 1/2 tablet of your 5mg  warfarin   Past Medical History:  Diagnosis Date  . Atrial fibrillation (HCC)    post op.  s/p dc-cv 04/2008. previously on amiodarone  . Atrial flutter (HCC)    atypical  . AV block, 1st degree    .450 msec--progressive  . 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  a- Echo 07/2008 Ef 55% mild peroprosthetic MVR with High transmitral gradient (mean 14).  b- normal coronaries by cath 12/2007  . BPH (benign prostatic hyperplasia)   . CHF (congestive heart failure) (HCC)    EF 35-40 % 2011 due to valvular disease and diastolic dysfunction  . Chronic back pain   . CKD (chronic kidney disease), stage III (HCC)   . Dysphagia    with normal barium swallow 09/2008  . Endocarditis   . GERD (gastroesophageal reflux disease)   . Heavy alcohol use    history  . History of GI bleed   . Hypertension   . IV drug abuse (HCC)    history of  . Lower GI bleed 01/2016  . Pacemaker 2010   Social History   Socioeconomic History  . Marital status: Divorced    Spouse name: None  . Number of children: 9  . Years of education: None  . Highest education level: None  Social Needs  . Financial resource strain: None  .  Food insecurity - worry: None  . Food insecurity - inability: None  . Transportation needs - medical: None  . Transportation needs - non-medical: None  Occupational History  . Occupation: retired  Tobacco Use  . Smoking status: Former Smoker    Years: 0.00    Types: Cigarettes  . Smokeless tobacco: Never Used  Substance and Sexual Activity  . Alcohol use: No    Alcohol/week: 0.0 oz  . Drug use: No  . Sexual activity: None  Other Topics Concern  . None  Social History Narrative   Exercise everyday riding a bike for 1 hour   Family History  Problem Relation Age of Onset  . Coronary artery disease Mother     ASSESSMENT Recent Results: Lab Results  Component Value Date   INR 3.8 09/08/2017   INR 2.7 07/27/2017   INR 2.1 07/20/2017   Anticoagulation Dosing: 5 mg daily (35 mg weekly)   INR today: Supratherapeutic  PLAN Weekly dose was decreased by 15% to 30 mg per week  Patient Instructions  Patient educated on dose change  Patient advised to contact clinic or seek medical attention if signs/symptoms of bleeding or thromboembolism occur.  Patient verbalized understanding by repeating back information and was advised to contact me if further medication-related questions arise. Patient was also provided an information handout.  Follow-up Return in about 6 months (around 03/11/2018).  [name]

## 2017-09-08 NOTE — Progress Notes (Signed)
CC: here for f/u CKD and BPH  HPI:  [name] is a 72 y.o. man with a past medical history listed below here today for follow up of his CKD and BPH.   For details of today's visit and the status of his chronic medical issues please refer to the assessment and plan.   Past Medical History:  Diagnosis Date  . Atrial fibrillation (HCC)    post op.  s/p dc-cv 04/2008. previously on amiodarone  . Atrial flutter (HCC)    atypical  . AV block, 1st degree    .450 msec--progressive  . 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  a- Echo 07/2008 Ef 55% mild peroprosthetic MVR with High transmitral gradient (mean 14).  b- normal coronaries by cath 12/2007  . BPH (benign prostatic hyperplasia)   . CHF (congestive heart failure) (HCC)    EF 35-40 % 2011 due to valvular disease and diastolic dysfunction  . Chronic back pain   . CKD (chronic kidney disease), stage III (HCC)   . Dysphagia    with normal barium swallow 09/2008  . Endocarditis   . GERD (gastroesophageal reflux disease)   . Heavy alcohol use    history  . History of GI bleed   . Hypertension   . IV drug abuse (HCC)    history of  . Lower GI bleed 01/2016  . Pacemaker 2010   Review of Systems:  Please see pertinent ROS reviewed in HPI and problem based charting.   Physical Exam:  Vitals:   09/08/17 0915  BP: 117/78  Pulse: 73  Temp: 98.1 F (36.7 C)  TempSrc: Oral  SpO2: 100%  Weight: 171 lb (77.6 kg)   Physical Exam  Constitutional: He is oriented to person, place, and time and well-developed, well-nourished, and in no distress.  HENT:  Head: Normocephalic and atraumatic.  Cardiovascular: Normal rate and regular rhythm.  Murmur heard. There is an audible clicking of his prosthetic valve.  Pulmonary/Chest: Effort normal and breath sounds normal. He has no wheezes. He has no rales.  Musculoskeletal: He exhibits no edema.  Neurological: He is alert and oriented to  person, place, and time.  Skin: Skin is warm and dry.  Psychiatric: Mood and affect normal.     Assessment & Plan:   See Encounters Tab for problem based charting.  Patient discussed with Dr. Heide Spark.  Benign prostatic hyperplasia with nocturia Assessment: He is experiencing LUTS today that has worsened over the past few months.  His primary complaint is nocturia and he reports going frequently about 5-6 times per night.  Also has frequency and hesitant stream.  He was initially started on finasteride in May 2017 instead of an alpha blocker due to his BP being borderline.    Plan: - we will check a PSA today as it was last checked in 2013 given his worsening LUTS - if PSA abnormal, will plan urology evaluation - if PSA normal, will consider change in therapy from finasteride to tamsulosin with close follow up of his blood pressure  Hematuria Assessment: He has hematuria noted on his last UA from July 2017.  He does not endorse any infectious urinary symptoms.  He is on chronic anticoagulation due to a prosthetic mitral valve with coumadin.  He does endorse LUTS but no symptoms concerning for any type of kidney stone.  Plan: - repeat UA today with possible need for urology evaluation if hematuria persists.  Memory  impairment Assessment: When queried he reports that his memory is about the same.  He has not noticed any significant changes.  He presents alone today so nobody is here to corroborate this.  Plan: - MMSE today is 28/30 (was 27/30 last year)  - PHQ is 0  Chronic kidney disease Assessment: He has CKD 2-3 over the past several years with creatinine levels ranging from 1.3-2.0.  Creatinine over past several months has been about 1.7.  He continues on all his medications for heart failure including furosemide, lisinopril, and spironolactone.  Weights have been stable.  Plan: - check BMET today - if creatinine has deteriorated, will consider ultrasound to rule out  obstruction given worsening BPH symptoms

## 2017-09-08 NOTE — Assessment & Plan Note (Addendum)
Assessment: He has hematuria noted on his last UA from July 2017.  He does not endorse any infectious urinary symptoms.  He is on chronic anticoagulation due to a prosthetic mitral valve with coumadin.  He does endorse LUTS but no symptoms concerning for any type of kidney stone.  Plan: - repeat UA today with possible need for urology evaluation if hematuria persists.  Addendum 9:12 AM 09/09/2017: - chronic hematuria persists w/o evidence of infection.  PSA normal - plan referral to urology for further evaluation - called patient and provided update

## 2017-09-08 NOTE — Patient Instructions (Signed)
Thank you for coming to see me today. It was a pleasure. Today we talked about:   We are checking some lab work for your prostate and kidney function.    I will let you know if anything else is abnormal with this.  Please follow-up with me in 6 months or sooner if needed.  If you have any questions or concerns, please do not hesitate to call the office at [phone].  Take Care,   [name], DO

## 2017-09-09 ENCOUNTER — Encounter: Payer: Medicare Other | Admitting: Internal Medicine

## 2017-09-09 LAB — MICROSCOPIC EXAMINATION: EPITHELIAL CELLS (NON RENAL): NONE SEEN /HPF (ref 0–10)

## 2017-09-09 LAB — URINALYSIS, ROUTINE W REFLEX MICROSCOPIC
Bilirubin, UA: NEGATIVE
GLUCOSE, UA: NEGATIVE
KETONES UA: NEGATIVE
LEUKOCYTES UA: NEGATIVE
Nitrite, UA: NEGATIVE
SPEC GRAV UA: 1.021 (ref 1.005–1.030)
Urobilinogen, Ur: 1 mg/dL (ref 0.2–1.0)
pH, UA: 6.5 (ref 5.0–7.5)

## 2017-09-09 LAB — BMP8+ANION GAP
Anion Gap: 14 mmol/L (ref 10.0–18.0)
BUN / CREAT RATIO: 12 (ref 10–24)
BUN: 16 mg/dL (ref 8–27)
CHLORIDE: 104 mmol/L (ref 96–106)
CO2: 24 mmol/L (ref 20–29)
Calcium: 8.9 mg/dL (ref 8.6–10.2)
Creatinine, Ser: 1.38 mg/dL — ABNORMAL HIGH (ref 0.76–1.27)
GFR calc non Af Amer: 50 mL/min/{1.73_m2} — ABNORMAL LOW (ref >59)
GFR, EST AFRICAN AMERICAN: 58 mL/min/{1.73_m2} — AB (ref >59)
GLUCOSE: 94 mg/dL (ref 65–99)
Potassium: 4 mmol/L (ref 3.5–5.2)
SODIUM: 142 mmol/L (ref 134–144)

## 2017-09-09 LAB — PSA: PROSTATE SPECIFIC AG, SERUM: 0.9 ng/mL (ref 0.0–4.0)

## 2017-09-09 NOTE — Addendum Note (Signed)
Addended by: Kathlynn Grate on: 09/09/2017 09:17 AM   Modules accepted: Orders

## 2017-09-13 NOTE — Progress Notes (Signed)
Internal Medicine Clinic Attending  Case discussed with Dr. Wallace at the time of the visit.  We reviewed the resident's history and exam and pertinent patient test results.  I agree with the assessment, diagnosis, and plan of care documented in the resident's note.  

## 2017-09-16 ENCOUNTER — Other Ambulatory Visit (HOSPITAL_COMMUNITY): Payer: Self-pay | Admitting: Internal Medicine

## 2017-09-16 DIAGNOSIS — I5022 Chronic systolic (congestive) heart failure: Secondary | ICD-10-CM

## 2017-09-22 ENCOUNTER — Ambulatory Visit (INDEPENDENT_AMBULATORY_CARE_PROVIDER_SITE_OTHER): Payer: Medicare Other | Admitting: Pharmacist

## 2017-09-22 DIAGNOSIS — Z7901 Long term (current) use of anticoagulants: Secondary | ICD-10-CM

## 2017-09-22 DIAGNOSIS — Z5181 Encounter for therapeutic drug level monitoring: Secondary | ICD-10-CM | POA: Diagnosis not present

## 2017-09-22 DIAGNOSIS — Z952 Presence of prosthetic heart valve: Secondary | ICD-10-CM | POA: Diagnosis not present

## 2017-09-22 LAB — POCT INR: INR: 2.3

## 2017-09-22 NOTE — Progress Notes (Signed)
Anticoagulation Management Richard Gladbach Sellarsis a 72 y.o.malewho reports to the clinic for monitoring of warfarintreatment.   Indication:Atrial Flutter (resolved) [148.92], irregular pulse [149.9], Long term (current) use of anticoagulants [Z79.01],H/O mitral valve replacement with mechanical valve, CHADSVASC 3.2, HASBLED 3 Duration:indefinite Supervising physician:Duncan Vincent  Anticoagulation Clinic Visit History: Patientdoes notreport signs/symptoms of bleeding or thromboembolism  Anticoagulation Episode Summary    Current INR goal:   2.5-3.5  TTR:   66.2 % (5 y)  Next INR check:   10/11/2017  INR from last check:   2.3! (09/22/2017)  Weekly max warfarin dose:     Target end date:   Indefinite  INR check location:   [facility]  Preferred lab:     Send INR reminders to:   [facility] [address]   Indications   IRREGULAR PULSE [I49.9] Long term (current) use of anticoagulants (Resolved) [Z79.01] ATRIAL FLUTTER (Resolved) [I48.92] H/O mitral valve replacement with mechanical valve [Z95.2]       Comments:         Anticoagulation Care Providers    Provider Role Specialty Phone number   [name], MD  Cardiology [phone]     No Known Allergies Medication Sig  acetaminophen (TYLENOL) 500 MG tablet Take 1,000 mg by mouth every 6 (six) hours as needed for mild pain.  carvedilol (COREG) 6.25 MG tablet Take 1 tablet (6.25 mg total) by mouth 2 (two) times daily with a meal. Needs office visit for further refills [phone]  finasteride (PROSCAR) 5 MG tablet Take 1 tablet (5 mg total) by mouth daily.  furosemide (LASIX) 20 MG tablet TAKE 1 TABLET (20 MG TOTAL) BY MOUTH DAILY.  lisinopril (PRINIVIL,ZESTRIL) 2.5 MG tablet Take 1 tablet (2.5 mg total) by mouth 2 (two) times daily.  potassium chloride SA (KLOR-CON M20) 20 MEQ tablet Take 1 tablet (20 mEq total) by mouth daily.  spironolactone (ALDACTONE) 25 MG tablet TAKE 1 TABLET BY MOUTH EVERY DAY   warfarin (COUMADIN) 5 MG tablet 1 tablet by mouth daily at 6PM all days of week except on Mondays, Wednesdays and Fridays--take only 1/2 tablet of your 5mg  warfarin   Past Medical History:  Diagnosis Date  . Atrial fibrillation (HCC)    post op.  s/p dc-cv 04/2008. previously on amiodarone  . Atrial flutter (HCC)    atypical  . AV block, 1st degree    .450 msec--progressive  . 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  a- Echo 07/2008 Ef 55% mild peroprosthetic MVR with High transmitral gradient (mean 14).  b- normal coronaries by cath 12/2007  . BPH (benign prostatic hyperplasia)   . CHF (congestive heart failure) (HCC)    EF 35-40 % 2011 due to valvular disease and diastolic dysfunction  . Chronic back pain   . CKD (chronic kidney disease), stage III (HCC)   . Dysphagia    with normal barium swallow 09/2008  . Endocarditis   . GERD (gastroesophageal reflux disease)   . Heavy alcohol use    history  . History of GI bleed   . Hypertension   . IV drug abuse (HCC)    history of  . Lower GI bleed 01/2016  . Pacemaker 2010   Social History   Socioeconomic History  . Marital status: Divorced    Spouse name: Not on file  . Number of children: 9  . Years of education: Not on file  . Highest education level: Not on file  Social Needs  . Financial resource strain: Not on file  . Food insecurity - worry: Not on file  . Food insecurity - inability: Not on file  . Transportation needs - medical: Not on file  . Transportation needs - non-medical: Not on file  Occupational History  . Occupation: retired  Tobacco Use  . Smoking status: Former Smoker    Years: 0.00    Types: Cigarettes  . Smokeless tobacco: Never Used  Substance and Sexual Activity  . Alcohol use: No    Alcohol/week: 0.0 oz  . Drug use: No  . Sexual activity: Not on file  Other Topics Concern  . Not on file  Social History Narrative   Exercise everyday riding a bike for 1  hour   Family History  Problem Relation Age of Onset  . Coronary artery disease Mother    ASSESSMENT Recent Results: Lab Results  Component Value Date   INR 2.3 09/22/2017   INR 3.8 09/08/2017   INR 2.7 07/27/2017   Anticoagulation Dosing: 30 mg weekly   INR today: Subtherapeutic  PLAN Weekly dose was increased by 8% to 32.5 mg per week: patient due for 1/2 tablet (2.5 mg today); instead, patient was instructed to increase to full tablet (5 mg) today.  There are no Patient Instructions on file for this visit. Patient advised to contact clinic or seek medical attention if signs/symptoms of bleeding or thromboembolism occur.  Patient verbalized understanding by repeating back information and was advised to contact me if further medication-related questions arise. Patient was also provided an information handout.  Follow-up Return in about 3 weeks (around 10/11/2017).  [name]

## 2017-09-22 NOTE — Progress Notes (Signed)
INTERNAL MEDICINE TEACHING ATTENDING ADDENDUM ° °I agree with pharmacy recommendations as outlined in their note.  ° °-Duncan Vincent MD ° °

## 2017-10-07 DIAGNOSIS — N401 Enlarged prostate with lower urinary tract symptoms: Secondary | ICD-10-CM | POA: Diagnosis not present

## 2017-10-07 DIAGNOSIS — R350 Frequency of micturition: Secondary | ICD-10-CM | POA: Diagnosis not present

## 2017-10-07 DIAGNOSIS — R3121 Asymptomatic microscopic hematuria: Secondary | ICD-10-CM | POA: Diagnosis not present

## 2017-10-15 ENCOUNTER — Other Ambulatory Visit: Payer: Self-pay | Admitting: Internal Medicine

## 2017-10-15 ENCOUNTER — Other Ambulatory Visit (HOSPITAL_COMMUNITY): Payer: Self-pay | Admitting: Cardiology

## 2017-10-15 DIAGNOSIS — I4892 Unspecified atrial flutter: Secondary | ICD-10-CM

## 2017-10-15 NOTE — Telephone Encounter (Signed)
Called pt to schedule an appt - Front office schedule coumadin appt w/Dr Selena Batten 4/16.

## 2017-10-19 ENCOUNTER — Ambulatory Visit (INDEPENDENT_AMBULATORY_CARE_PROVIDER_SITE_OTHER): Payer: Medicare Other | Admitting: Pharmacist

## 2017-10-19 ENCOUNTER — Other Ambulatory Visit: Payer: Self-pay | Admitting: *Deleted

## 2017-10-19 DIAGNOSIS — Z5181 Encounter for therapeutic drug level monitoring: Secondary | ICD-10-CM

## 2017-10-19 DIAGNOSIS — Z7901 Long term (current) use of anticoagulants: Secondary | ICD-10-CM

## 2017-10-19 DIAGNOSIS — I499 Cardiac arrhythmia, unspecified: Secondary | ICD-10-CM

## 2017-10-19 DIAGNOSIS — I5022 Chronic systolic (congestive) heart failure: Secondary | ICD-10-CM

## 2017-10-19 DIAGNOSIS — Z952 Presence of prosthetic heart valve: Secondary | ICD-10-CM

## 2017-10-19 DIAGNOSIS — I4892 Unspecified atrial flutter: Secondary | ICD-10-CM

## 2017-10-19 LAB — POCT INR: INR: 3.2

## 2017-10-19 MED ORDER — WARFARIN SODIUM 5 MG PO TABS: ORAL_TABLET | ORAL | 2 refills | Status: DC

## 2017-10-19 MED ORDER — CARVEDILOL 6.25 MG PO TABS: 6.2500 mg | ORAL_TABLET | Freq: Two times a day (BID) | ORAL | 2 refills | Status: DC

## 2017-10-19 NOTE — Progress Notes (Signed)
Anticoagulation Management [name] is a 72 y.o. male who reports to the clinic for monitoring of warfarin treatment.    Indication: Long term use of anticoagulants for h/o mitral valve replacement with mechanical valve  Duration: indefinite Supervising physician: Blanch Media  Anticoagulation Clinic Visit History: Patient does not report signs/symptoms of bleeding or thromboembolism  Other recent changes: Denies any diet, medications, lifestyle changes  No Known Allergies Prior to Admission medications   Medication Sig Start Date End Date Taking? Authorizing Provider  acetaminophen (TYLENOL) 500 MG tablet Take 1,000 mg by mouth every 6 (six) hours as needed for mild pain.    [provider]  carvedilol (COREG) 6.25 MG tablet Take 1 tablet (6.25 mg total) by mouth 2 (two) times daily with a meal. Needs office visit for further refills [phone] 08/09/17   Bensimhon, Bevelyn Buckles, MD  finasteride (PROSCAR) 5 MG tablet Take 1 tablet (5 mg total) by mouth daily. 07/27/17   [name], DO  furosemide (LASIX) 20 MG tablet TAKE 1 TABLET (20 MG TOTAL) BY MOUTH DAILY. 12/04/16   Bensimhon, Bevelyn Buckles, MD  lisinopril (PRINIVIL,ZESTRIL) 2.5 MG tablet Take 1 tablet (2.5 mg total) by mouth 2 (two) times daily. 09/28/16   Bensimhon, Bevelyn Buckles, MD  potassium chloride SA (KLOR-CON M20) 20 MEQ tablet Take 1 tablet (20 mEq total) by mouth daily. 07/27/17   [name], DO  spironolactone (ALDACTONE) 25 MG tablet Take 1 tablet (25 mg total) by mouth daily. Needs office visit [phone] 10/18/17   [name], MD  warfarin (COUMADIN) 5 MG tablet 1 TABLET BY MOUTH DAILY AT 6PM ALL DAYS OF WEEK EXCEPT FRI ONLY TAKE 1/2 TABLET 10/15/17   [name], DO   Past Medical History:  Diagnosis Date  . Atrial fibrillation (HCC)    post op.  s/p dc-cv 04/2008. previously on amiodarone  . Atrial flutter (HCC)    atypical  . AV block, 1st degree    .450 msec--progressive  . Bacterial  endocarditis    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  a- Echo 07/2008 Ef 55% mild peroprosthetic MVR with High transmitral gradient (mean 14).  b- normal coronaries by cath 12/2007  . BPH (benign prostatic hyperplasia)   . CHF (congestive heart failure) (HCC)    EF 35-40 % 2011 due to valvular disease and diastolic dysfunction  . Chronic back pain   . CKD (chronic kidney disease), stage III (HCC)   . Dysphagia    with normal barium swallow 09/2008  . Endocarditis   . GERD (gastroesophageal reflux disease)   . Heavy alcohol use    history  . History of GI bleed   . Hypertension   . IV drug abuse (HCC)    history of  . Lower GI bleed 01/2016  . Pacemaker 2010   Social History   Socioeconomic History  . Marital status: Divorced    Spouse name: Not on file  . Number of children: 9  . Years of education: Not on file  . Highest education level: Not on file  Occupational History  . Occupation: retired  Engineer, production  . Financial resource strain: Not on file  . Food insecurity:    Worry: Not on file    Inability: Not on file  . Transportation needs:    Medical: Not on file    Non-medical: Not on file  Tobacco Use  . Smoking status: Former Smoker    Years: 0.00  Types: Cigarettes  . Smokeless tobacco: Never Used  Substance and Sexual Activity  . Alcohol use: No    Alcohol/week: 0.0 oz  . Drug use: No  . Sexual activity: Not on file  Lifestyle  . Physical activity:    Days per week: Not on file    Minutes per session: Not on file  . Stress: Not on file  Relationships  . Social connections:    Talks on phone: Not on file    Gets together: Not on file    Attends religious service: Not on file    Active member of club or organization: Not on file    Attends meetings of clubs or organizations: Not on file    Relationship status: Not on file  Other Topics Concern  . Not on file  Social History Narrative   Exercise everyday riding a bike for 1 hour    Family History  Problem Relation Age of Onset  . Coronary artery disease Mother     ASSESSMENT Recent Results: The most recent result is correlated with 32.5 mg per week: INR goal 2.5-3.5 Lab Results  Component Value Date   INR 3.2 10/19/2017   INR 2.3 09/22/2017   INR 3.8 09/08/2017    Anticoagulation Dosing: Description   Takes 1 of the 5mg  tablets once daily except on Fridays, take 1/2 tablet.     INR today: Therapeutic  PLAN Weekly dose was unchanged as INR was at goal  Patient Instructions  Patient instructed to take medications as defined in the Anti-coagulation Track section of this encounter.  Patient instructed to take today's dose.  Patient verbalized understanding of these instructions.  Patient was instructed to continue to take 1 of the 5mg  tablets once daily except on Fridays, take 1/2 tablet.  Patient advised to contact clinic or seek medical attention if signs/symptoms of bleeding or thromboembolism occur.  Patient verbalized understanding by repeating back information and was advised to contact me if further medication-related questions arise. Patient was also provided an information handout.  Follow-up Return in about 1 month (around 11/16/2017) for INR f/u at 10:00 on 5/14.  Nolen Mu PharmD PGY1 Pharmacy Practice Resident 10/19/2017 10:51 AM  15 minutes spent face-to-face with the patient during the encounter. 50% of time spent on education. 50% of time was spent on collection of INR sample and documentation into Epic and [email or web address].

## 2017-10-19 NOTE — Patient Instructions (Signed)
Patient instructed to take medications as defined in the Anti-coagulation Track section of this encounter.  Patient instructed to take today's dose.  Patient verbalized understanding of these instructions.  Patient was instructed to continue to take 1 of the 5mg  tablets once daily except on Fridays, take 1/2 tablet.

## 2017-10-20 NOTE — Telephone Encounter (Signed)
Called pt - talked to [name] , who takes care of him - stated he does not have an appt to see his heart doctor but will call/schedule an appt.

## 2017-10-21 DIAGNOSIS — R3129 Other microscopic hematuria: Secondary | ICD-10-CM | POA: Diagnosis not present

## 2017-10-21 DIAGNOSIS — R3121 Asymptomatic microscopic hematuria: Secondary | ICD-10-CM | POA: Diagnosis not present

## 2017-10-27 NOTE — Progress Notes (Signed)
Patient was seen in clinic by Austin Lucas, PharmD, PGY1 pharmacy resident. I agree with the assessment and plan of care documented.  

## 2017-11-04 DIAGNOSIS — R3121 Asymptomatic microscopic hematuria: Secondary | ICD-10-CM | POA: Diagnosis not present

## 2017-11-04 DIAGNOSIS — R350 Frequency of micturition: Secondary | ICD-10-CM | POA: Diagnosis not present

## 2017-11-04 DIAGNOSIS — N401 Enlarged prostate with lower urinary tract symptoms: Secondary | ICD-10-CM | POA: Diagnosis not present

## 2017-11-05 ENCOUNTER — Other Ambulatory Visit: Payer: Self-pay | Admitting: Internal Medicine

## 2017-11-05 DIAGNOSIS — E876 Hypokalemia: Secondary | ICD-10-CM

## 2017-11-08 NOTE — Telephone Encounter (Signed)
Care giver returned call. Made aware to have pt stop potassium and f/u in [facility] scheduled by Practice Manager for 12/09/2017 at 0915. PCP unavailable. Kinnie Feil, RN, BSN

## 2017-11-08 NOTE — Telephone Encounter (Signed)
Left message on VM requesting return call to discuss info from PCP and schedule appt. Kinnie Feil, RN, BSN

## 2017-11-08 NOTE — Telephone Encounter (Signed)
Not sure if pt needs to be on this medication given that he is also on lisinopril and sprionolactone with mild renal insufficiency at baseline.  Will decline for now.  Please have patient scheduled for f/u visit in about 1 month to recheck BMET, specifically potassium off supplementation.  Thanks.

## 2017-11-16 ENCOUNTER — Ambulatory Visit: Payer: Medicare Other | Admitting: Pharmacist

## 2017-11-16 DIAGNOSIS — Z952 Presence of prosthetic heart valve: Secondary | ICD-10-CM

## 2017-11-16 LAB — POCT INR: INR: 3.8

## 2017-11-16 NOTE — Patient Instructions (Signed)
Patient educated about medication as defined in this encounter and verbalized understanding by repeating back instructions provided.   

## 2017-11-16 NOTE — Progress Notes (Unsigned)
Patient was seen in clinic with [name], PharmD candidate. I agree with the assessment and plan of care documented.  INR 3.8 today, gaol 2.5-3.5 (mechanical mitral valve)  Patient had already taken his warfarin dose today, so was instructed to reduce tomorrow's dose to 1/2 tablet (2.5 mg). Patient reported no signs/symptoms of bleeding or bruising.

## 2017-11-16 NOTE — Progress Notes (Unsigned)
Anticoagulation Management [name] is a 72 y.o. male who reports to the clinic for monitoring of warfarin treatment.    Indication: Long term use of anticoagulants for h/o mitral valve replacement with mechanical valve  Duration: indefinite Supervising physician: Blanch Media  Anticoagulation Clinic Visit History: Patient does not report signs/symptoms of bleeding or thromboembolism Other recent changes: denies any diet or lifestyle changes. Medication changes: physician recommended stopping potassium ~1 week ago.  Anticoagulation Episode Summary    Current INR goal:   2.5-3.5  TTR:   66.3 % (5.1 y)  Next INR check:   11/16/2017  INR from last check:   3.2 (10/19/2017)  Weekly max warfarin dose:     Target end date:   Indefinite  INR check location:   Anticoagulation Clinic  Preferred lab:     Send INR reminders to:   [facility] [address]   Indications   IRREGULAR PULSE [I49.9] Long term (current) use of anticoagulants (Resolved) [Z79.01] ATRIAL FLUTTER (Resolved) [I48.92] H/O mitral valve replacement with mechanical valve [Z95.2]       Comments:         Anticoagulation Care Providers    Provider Role Specialty Phone number   [name], MD  Cardiology [phone]      No Known Allergies Prior to Admission medications   Medication Sig Start Date End Date Taking? Authorizing Provider  acetaminophen (TYLENOL) 500 MG tablet Take 1,000 mg by mouth every 6 (six) hours as needed for mild pain.    [provider]  carvedilol (COREG) 6.25 MG tablet Take 1 tablet (6.25 mg total) by mouth 2 (two) times daily with a meal. 10/19/17   [name], DO  finasteride (PROSCAR) 5 MG tablet Take 1 tablet (5 mg total) by mouth daily. 07/27/17   [name], DO  furosemide (LASIX) 20 MG tablet TAKE 1 TABLET (20 MG TOTAL) BY MOUTH DAILY. 12/04/16   Bensimhon, Bevelyn Buckles, MD  lisinopril (PRINIVIL,ZESTRIL) 2.5 MG tablet Take 1 tablet (2.5 mg total) by  mouth 2 (two) times daily. 09/28/16   Bensimhon, Bevelyn Buckles, MD  potassium chloride SA (KLOR-CON M20) 20 MEQ tablet Take 1 tablet (20 mEq total) by mouth daily. 07/27/17   [name], DO  spironolactone (ALDACTONE) 25 MG tablet Take 1 tablet (25 mg total) by mouth daily. Needs office visit [phone] 10/18/17   [name], MD  warfarin (COUMADIN) 5 MG tablet 1 TABLET BY MOUTH DAILY AT 6PM ALL DAYS OF WEEK EXCEPT FRI ONLY TAKE 1/2 TABLET 10/19/17   [name], DO   Past Medical History:  Diagnosis Date  . Atrial fibrillation (HCC)    post op.  s/p dc-cv 04/2008. previously on amiodarone  . Atrial flutter (HCC)    atypical  . AV block, 1st degree    .450 msec--progressive  . 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  a- Echo 07/2008 Ef 55% mild peroprosthetic MVR with High transmitral gradient (mean 14).  b- normal coronaries by cath 12/2007  . BPH (benign prostatic hyperplasia)   . CHF (congestive heart failure) (HCC)    EF 35-40 % 2011 due to valvular disease and diastolic dysfunction  . Chronic back pain   . CKD (chronic kidney disease), stage III (HCC)   . Dysphagia    with normal barium swallow 09/2008  . Endocarditis   . GERD (gastroesophageal reflux disease)   . Heavy alcohol use    history  .  History of GI bleed   . Hypertension   . IV drug abuse (HCC)    history of  . Lower GI bleed 01/2016  . Pacemaker 2010   Social History   Socioeconomic History  . Marital status: Divorced    Spouse name: Not on file  . Number of children: 9  . Years of education: Not on file  . Highest education level: Not on file  Occupational History  . Occupation: retired  Engineer, production  . Financial resource strain: Not on file  . Food insecurity:    Worry: Not on file    Inability: Not on file  . Transportation needs:    Medical: Not on file    Non-medical: Not on file  Tobacco Use  . Smoking status: Former Smoker    Years: 0.00    Types:  Cigarettes  . Smokeless tobacco: Never Used  Substance and Sexual Activity  . Alcohol use: No    Alcohol/week: 0.0 oz  . Drug use: No  . Sexual activity: Not on file  Lifestyle  . Physical activity:    Days per week: Not on file    Minutes per session: Not on file  . Stress: Not on file  Relationships  . Social connections:    Talks on phone: Not on file    Gets together: Not on file    Attends religious service: Not on file    Active member of club or organization: Not on file    Attends meetings of clubs or organizations: Not on file    Relationship status: Not on file  Other Topics Concern  . Not on file  Social History Narrative   Exercise everyday riding a bike for 1 hour   Family History  Problem Relation Age of Onset  . Coronary artery disease Mother     ASSESSMENT Recent Results: The most recent result is correlated with 32.5 mg per week: Lab Results  Component Value Date   INR 3.2 10/19/2017   INR 2.3 09/22/2017   INR 3.8 09/08/2017    Anticoagulation Dosing: Description   Takes 1 of the 5mg  tablets once daily except on Fridays, take 1/2 tablet.     INR today: Supratherapeutic  PLAN Weekly dose was decreased by 1% to 30 mg per week  Patient instruction: Patient was instructed to take 1 full tablet (5 mg) once daily except 1/2 tablet on Wednesday and Friday   Patient advised to contact clinic or seek medical attention if signs/symptoms of bleeding or thromboembolism occur. Patient verbalized understanding by repeating back information and was advised to contact me if further medication-related questions arise. Patient was also provided an information handout.  Follow-up Return in 1 week (11/24/17 @ 10:00 am) for INR f/u  Marcelino Duster  Emiel Kielty  15 minutes spent face-to-face with the patient during the encounter. 50% of time spent on education. 50% of time was spent on collection of INR sample and documentation into Epic and [email or web address].

## 2017-11-18 NOTE — Progress Notes (Signed)
INTERNAL MEDICINE TEACHING ATTENDING ADDENDUM - Gust Rung, DO Duration- indefinate, Indication- mech mitral valve replacement, INR-  Lab Results  Component Value Date   INR 3.8 11/16/2017  . Agree with pharmacy recommendations as outlined in their note.

## 2017-11-21 ENCOUNTER — Other Ambulatory Visit (HOSPITAL_COMMUNITY): Payer: Self-pay | Admitting: Cardiology

## 2017-11-23 ENCOUNTER — Ambulatory Visit (INDEPENDENT_AMBULATORY_CARE_PROVIDER_SITE_OTHER): Payer: Medicare Other | Admitting: Pharmacist

## 2017-11-23 DIAGNOSIS — Z952 Presence of prosthetic heart valve: Secondary | ICD-10-CM

## 2017-11-23 DIAGNOSIS — I499 Cardiac arrhythmia, unspecified: Secondary | ICD-10-CM | POA: Diagnosis not present

## 2017-11-23 DIAGNOSIS — Z7901 Long term (current) use of anticoagulants: Secondary | ICD-10-CM

## 2017-11-23 DIAGNOSIS — Z5181 Encounter for therapeutic drug level monitoring: Secondary | ICD-10-CM

## 2017-11-23 LAB — POCT INR: INR: 2.7 (ref 2.0–3.0)

## 2017-11-23 NOTE — Progress Notes (Signed)
Anticoagulation Management [name] is a 72 y.o. male who reports to the clinic for monitoring of warfarin treatment.    Indication:Long term use of anticoagulants for h/o mitral valve replacement with mechanical valve Duration:indefinite Supervising physician:[name]  Anticoagulation Clinic Visit History: Patient does not report signs/symptoms of bleeding or thromboembolism No other recent changes in diet, medications, lifestyle Anticoagulation Episode Summary    Current INR goal:   2.5-3.5  TTR:   66.1 % (5.2 y)  Next INR check:   12/06/2017  INR from last check:   2.7 (11/23/2017)  Weekly max warfarin dose:     Target end date:   Indefinite  INR check location:   Anticoagulation Clinic  Preferred lab:     Send INR reminders to:   [facility] [address]   Indications   IRREGULAR PULSE [I49.9] Long term (current) use of anticoagulants (Resolved) [Z79.01] ATRIAL FLUTTER (Resolved) [I48.92] H/O mitral valve replacement with mechanical valve [Z95.2]       Comments:         Anticoagulation Care Providers    Provider Role Specialty Phone number   [name], MD  Cardiology [phone]      No Known Allergies Prior to Admission medications   Medication Sig Start Date End Date Taking? Authorizing Provider  acetaminophen (TYLENOL) 500 MG tablet Take 1,000 mg by mouth every 6 (six) hours as needed for mild pain.    [provider]  carvedilol (COREG) 6.25 MG tablet Take 1 tablet (6.25 mg total) by mouth 2 (two) times daily with a meal. 10/19/17   [name], DO  finasteride (PROSCAR) 5 MG tablet Take 1 tablet (5 mg total) by mouth daily. 07/27/17   [name], DO  furosemide (LASIX) 20 MG tablet TAKE 1 TABLET (20 MG TOTAL) BY MOUTH DAILY. 12/04/16   Bensimhon, Bevelyn Buckles, MD  lisinopril (PRINIVIL,ZESTRIL) 2.5 MG tablet Take 1 tablet (2.5 mg total) by mouth 2 (two) times daily. 09/28/16   Bensimhon, Bevelyn Buckles, MD  potassium chloride SA  (KLOR-CON M20) 20 MEQ tablet Take 1 tablet (20 mEq total) by mouth daily. 07/27/17   [name], DO  spironolactone (ALDACTONE) 25 MG tablet TAKE 1 TABLET (25 MG TOTAL) BY MOUTH DAILY. NEEDS OFFICE VISIT [phone] 11/22/17   [name], MD  warfarin (COUMADIN) 5 MG tablet 1 TABLET BY MOUTH DAILY AT 6PM ALL DAYS OF WEEK EXCEPT FRI ONLY TAKE 1/2 TABLET 10/19/17   [name], DO   Past Medical History:  Diagnosis Date  . Atrial fibrillation (HCC)    post op.  s/p dc-cv 04/2008. previously on amiodarone  . Atrial flutter (HCC)    atypical  . AV block, 1st degree    .450 msec--progressive  . 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  a- Echo 07/2008 Ef 55% mild peroprosthetic MVR with High transmitral gradient (mean 14).  b- normal coronaries by cath 12/2007  . BPH (benign prostatic hyperplasia)   . CHF (congestive heart failure) (HCC)    EF 35-40 % 2011 due to valvular disease and diastolic dysfunction  . Chronic back pain   . CKD (chronic kidney disease), stage III (HCC)   . Dysphagia    with normal barium swallow 09/2008  . Endocarditis   . GERD (gastroesophageal reflux disease)   . Heavy alcohol use    history  . History of GI bleed   . Hypertension   . IV drug abuse (  HCC)    history of  . Lower GI bleed 01/2016  . Pacemaker 2010   Social History   Socioeconomic History  . Marital status: Divorced    Spouse name: Not on file  . Number of children: 9  . Years of education: Not on file  . Highest education level: Not on file  Occupational History  . Occupation: retired  Engineer, production  . Financial resource strain: Not on file  . Food insecurity:    Worry: Not on file    Inability: Not on file  . Transportation needs:    Medical: Not on file    Non-medical: Not on file  Tobacco Use  . Smoking status: Former Smoker    Years: 0.00    Types: Cigarettes  . Smokeless tobacco: Never Used  Substance and Sexual Activity  .  Alcohol use: No    Alcohol/week: 0.0 oz  . Drug use: No  . Sexual activity: Not on file  Lifestyle  . Physical activity:    Days per week: Not on file    Minutes per session: Not on file  . Stress: Not on file  Relationships  . Social connections:    Talks on phone: Not on file    Gets together: Not on file    Attends religious service: Not on file    Active member of club or organization: Not on file    Attends meetings of clubs or organizations: Not on file    Relationship status: Not on file  Other Topics Concern  . Not on file  Social History Narrative   Exercise everyday riding a bike for 1 hour   Family History  Problem Relation Age of Onset  . Coronary artery disease Mother     ASSESSMENT Recent Results: The most recent result is correlated with 30 mg per week: Lab Results  Component Value Date   INR 2.7 11/23/2017   INR 3.8 11/16/2017   INR 3.2 10/19/2017    Anticoagulation Dosing: Description   Takes 1 of the 5mg  tablets once daily except on Fridays, take 1/2 tablet.     INR today: Therapeutic  PLAN Weekly dose was unchanged.  Patient Instructions  Patient educated about medication as defined in this encounter and verbalized understanding by repeating back instructions provided.    Patient advised to contact clinic or seek medical attention if signs/symptoms of bleeding or thromboembolism occur.  Patient verbalized understanding by repeating back information and was advised to contact me if further medication-related questions arise. Patient was also provided an information handout.  Follow-up In 3 weeks (12/06/17) at 10:00 am.   [name]  100% minutes spent face-to-face with the patient during the encounter. 40% of time spent on education. 60% of time was spent on INR and counseling.

## 2017-11-24 ENCOUNTER — Ambulatory Visit: Payer: Medicare Other | Admitting: Pharmacist

## 2017-11-24 ENCOUNTER — Other Ambulatory Visit: Payer: Self-pay

## 2017-11-24 NOTE — Patient Instructions (Signed)
Patient educated about medication as defined in this encounter and verbalized understanding by repeating back instructions provided.   

## 2017-11-24 NOTE — Progress Notes (Signed)
INR 2.7, no changes made. Patient was seen in clinic with [name], PharmD candidate. I agree with the assessment and plan of care documented.

## 2017-11-29 ENCOUNTER — Other Ambulatory Visit (HOSPITAL_COMMUNITY): Payer: Self-pay | Admitting: Internal Medicine

## 2017-12-06 ENCOUNTER — Encounter (HOSPITAL_COMMUNITY): Payer: Self-pay | Admitting: Internal Medicine

## 2017-12-06 ENCOUNTER — Ambulatory Visit (HOSPITAL_COMMUNITY)
Admission: RE | Admit: 2017-12-06 | Discharge: 2017-12-06 | Disposition: A | Discharge status: Home / Self Care | Payer: Medicare Other | Source: Ambulatory Visit | Attending: Internal Medicine | Admitting: Internal Medicine

## 2017-12-06 ENCOUNTER — Ambulatory Visit: Payer: Medicare Other

## 2017-12-06 ENCOUNTER — Other Ambulatory Visit: Payer: Self-pay

## 2017-12-06 VITALS — BP 113/72 | HR 70 | Wt 172.4 lb

## 2017-12-06 DIAGNOSIS — Z9889 Other specified postprocedural states: Secondary | ICD-10-CM | POA: Diagnosis not present

## 2017-12-06 DIAGNOSIS — I480 Paroxysmal atrial fibrillation: Secondary | ICD-10-CM | POA: Insufficient documentation

## 2017-12-06 DIAGNOSIS — Z79899 Other long term (current) drug therapy: Secondary | ICD-10-CM | POA: Insufficient documentation

## 2017-12-06 DIAGNOSIS — Z95810 Presence of automatic (implantable) cardiac defibrillator: Secondary | ICD-10-CM

## 2017-12-06 DIAGNOSIS — I5022 Chronic systolic (congestive) heart failure: Secondary | ICD-10-CM | POA: Diagnosis not present

## 2017-12-06 DIAGNOSIS — I4892 Unspecified atrial flutter: Secondary | ICD-10-CM | POA: Insufficient documentation

## 2017-12-06 DIAGNOSIS — Z952 Presence of prosthetic heart valve: Secondary | ICD-10-CM | POA: Diagnosis not present

## 2017-12-06 DIAGNOSIS — Z950 Presence of cardiac pacemaker: Secondary | ICD-10-CM | POA: Diagnosis not present

## 2017-12-06 DIAGNOSIS — N183 Chronic kidney disease, stage 3 (moderate): Secondary | ICD-10-CM | POA: Insufficient documentation

## 2017-12-06 DIAGNOSIS — Z7901 Long term (current) use of anticoagulants: Secondary | ICD-10-CM | POA: Diagnosis not present

## 2017-12-06 DIAGNOSIS — K219 Gastro-esophageal reflux disease without esophagitis: Secondary | ICD-10-CM | POA: Insufficient documentation

## 2017-12-06 DIAGNOSIS — I130 Hypertensive heart and chronic kidney disease with heart failure and stage 1 through stage 4 chronic kidney disease, or unspecified chronic kidney disease: Secondary | ICD-10-CM | POA: Insufficient documentation

## 2017-12-06 DIAGNOSIS — Z7982 Long term (current) use of aspirin: Secondary | ICD-10-CM | POA: Insufficient documentation

## 2017-12-06 DIAGNOSIS — N400 Enlarged prostate without lower urinary tract symptoms: Secondary | ICD-10-CM | POA: Insufficient documentation

## 2017-12-06 DIAGNOSIS — I429 Cardiomyopathy, unspecified: Secondary | ICD-10-CM | POA: Insufficient documentation

## 2017-12-06 LAB — BASIC METABOLIC PANEL
Anion gap: 7 (ref 5–15)
BUN: 11 mg/dL (ref 6–20)
CALCIUM: 9 mg/dL (ref 8.9–10.3)
CO2: 26 mmol/L (ref 22–32)
Chloride: 108 mmol/L (ref 101–111)
Creatinine, Ser: 1.28 mg/dL — ABNORMAL HIGH (ref 0.61–1.24)
GFR, EST NON AFRICAN AMERICAN: 53 mL/min — AB (ref >60)
Glucose, Bld: 95 mg/dL (ref 65–99)
Potassium: 3.9 mmol/L (ref 3.5–5.1)
Sodium: 141 mmol/L (ref 135–145)

## 2017-12-06 NOTE — Patient Instructions (Addendum)
Labs drawn today (if we do not call you, then your lab work was stable)   You have been referred to Dr. Graciela Husbands for ICD  Your physician has requested that you have an echocardiogram. Echocardiography is a painless test that uses sound waves to create images of your heart. It provides your doctor with information about the size and shape of your heart and how well your heart's chambers and valves are working. This procedure takes approximately one hour. There are no restrictions for this procedure. (they will call you)   Your physician recommends that you schedule a follow-up appointment in: 4 months with Dr.Bensimhon

## 2017-12-06 NOTE — Addendum Note (Signed)
Encounter addended by: [name], MD on: 12/06/2017 11:23 PM  Actions taken: Charge Capture section accepted

## 2017-12-06 NOTE — Progress Notes (Signed)
Patient ID: [name], male   DOB: 1945/09/05, 72 y.o.   MRN: [id number]     Advanced Heart Failure Clinic Note    Primary Cardiologist: Dr. Gala Romney Internal Medicine: Dr Earnest Conroy EP: Dr Graciela Husbands   HPI: [name] is a 72 year old male with h/o polysubstance abuse c/b MV endocarditis s/p St. Jude MVR, CHF due to NICM and PAF.  Underwent CRT-P implant for bradycardia and fatigue.   He presents today for follow up. We have not seen him since 1/18.  Says overall feels ok. Rides his bike 1-2x/day for 1-2 miles to the store and back. Says he often feels tired but gets on the bike and feels ok. No edema, palpitations, orthopnea, PND or CP. Takes warfarin regularly. INR has been good. No bleeding. Gets dizzy sometimes when he bends over and stands up. HF med titration had been limited by low BP in past.   STJ CRT-P interrogation. At [facility]. 18% AF with frequent mode switches.    Labs (1/15): K 3.9, creatinine 1.56, hemoglobin 11 Labs (01/14/2015) K 3.2 Creatinine 1.55 BNP 412 Labs (01/30/15): k 3.8 Creatinine 1.47  Labs (8/16): K 4, creatinine 1.55, digoxin 0.6 Labs (9/16) K 3.4, creatinine 1.48 Labs (09/24/2015) K 3.7 Creatinine 1.24   TTE in 2010 showed normal EF with elevated gradient across MV (mean 14) with perivalvular leak. Follow-up TEE in June 2010 with EF 40-45% mild stenosis mean gradient = 8. No vegetation or abscess.   Echo 8/12 EF 35% mild perivalvular MR.  Echo 3/13 EF 50% mild MVR stable with mild peri-valvular leak.  ECHO 12/16/2012 EF 50% stable MVR ECHO 12/2014 - EF 25-30%  Grade I DD, mechanical mitral valve with mean gradient 6 mmHg, no significant MR.  ECHO 09/2015- EF 35-40%. Grade I DD Mitral Valve ok no stenosis  SH: Lives with his wife. No ETOH, tobacco abuse or drugs.  FH: Mom deceased: no cardiac issues        Father deceased: no cardiac issues.   ROS: All systems negative except as listed in HPI, PMH and Problem List.  Past Medical History:  Diagnosis Date  .  Atrial fibrillation (HCC)    post op.  s/p dc-cv 04/2008. previously on amiodarone  . Atrial flutter (HCC)    atypical  . AV block, 1st degree    .450 msec--progressive  . 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  a- Echo 07/2008 Ef 55% mild peroprosthetic MVR with High transmitral gradient (mean 14).  b- normal coronaries by cath 12/2007  . BPH (benign prostatic hyperplasia)   . CHF (congestive heart failure) (HCC)    EF 35-40 % 2011 due to valvular disease and diastolic dysfunction  . Chronic back pain   . CKD (chronic kidney disease), stage III (HCC)   . Dysphagia    with normal barium swallow 09/2008  . Endocarditis   . GERD (gastroesophageal reflux disease)   . Heavy alcohol use    history  . History of GI bleed   . Hypertension   . IV drug abuse (HCC)    history of  . Lower GI bleed 01/2016  . Pacemaker 2010    Current Outpatient Medications  Medication Sig Dispense Refill  . acetaminophen (TYLENOL) 500 MG tablet Take 1,000 mg by mouth every 6 (six) hours as needed for mild pain.    . carvedilol (COREG) 6.25 MG tablet Take 1 tablet (6.25 mg total) by mouth 2 (two) times daily  with a meal. 60 tablet 2  . finasteride (PROSCAR) 5 MG tablet Take 1 tablet (5 mg total) by mouth daily. 30 tablet 5  . furosemide (LASIX) 20 MG tablet TAKE 1 TABLET (20 MG TOTAL) BY MOUTH DAILY. 90 tablet 3  . lisinopril (PRINIVIL,ZESTRIL) 2.5 MG tablet TAKE 1 TABLET TWICE A DAY 180 tablet 3  . spironolactone (ALDACTONE) 25 MG tablet TAKE 1 TABLET (25 MG TOTAL) BY MOUTH DAILY. NEEDS OFFICE VISIT [phone] 30 tablet 0  . warfarin (COUMADIN) 5 MG tablet 1 TABLET BY MOUTH DAILY AT 6PM ALL DAYS OF WEEK EXCEPT FRI ONLY TAKE 1/2 TABLET 30 tablet 2   No current facility-administered medications for this encounter.     Vitals:   12/06/17 1129  BP: 113/72  Pulse: 70  SpO2: 100%  Weight: 172 lb 6.4 oz (78.2 kg)   Wt Readings from Last 3 Encounters:  12/06/17 172 lb  6.4 oz (78.2 kg)  09/08/17 171 lb (77.6 kg)  06/10/17 172 lb 1.6 oz (78.1 kg)     PHYSICAL EXAM: General:  Well appearing. No resp difficulty HEENT: normal Neck: supple. no JVD. Carotids 2+ bilat; no bruits. No lymphadenopathy or thryomegaly appreciated. Cor: PMI nondisplaced. Regular rate & rhythm. Mechanical s1 Lungs: clear with mildly decreased BS throughout  Abdomen: soft, nontender, nondistended. No hepatosplenomegaly. No bruits or masses. Good bowel sounds. Extremities: no cyanosis, clubbing, rash, edema Neuro: alert & orientedx3, cranial nerves grossly intact. moves all 4 extremities w/o difficulty. Affect pleasant   ASSESSMENT & PLAN: 1. Chronic systolic HF: Nonischemic cardiomyopathy s/p St Jude  CRT-D.  March 2017 EF improved 35-40%.  NYHA class II symptoms. - Doing well. NYHA II  - Volume status looks good. Continue lasix 20 mg daily. - Continue Coreg 6.25 mg bid.  - Will stop digoxin.  - Continue lisinopril 2.5 mg BID. BP too soft for titration or for Entresto - Continue 25 mg spiro daily - ICD at [facility]. Refer back to Dr. Graciela Husbands.  2. Mechanical mitral valve:  - .On coumadin + aspirin 81 mg daily.   Goal INR 2.5-3.5.   INR followed at the coumadin clinic. I have reviewed values and they look good  - Due for repeat echo - Reminded about need for SBE prophylaxis with procedures  3. GI Bleed: 03/2014,  - no BRBPR/melena. Continue PPI.  4. HTN - Stable to low.  5. PAF - ICD interrogated personally shows 18% AF burden. Unclear if this is limiting biV pacing. Will await Dr. Graciela Husbands input regarding need for anti-arrhythmic therapy - continue coumadin  6. Memory Impairment - Follows with PCP.  - Seems to be doing well today    [name] MD 12/06/2017

## 2017-12-07 ENCOUNTER — Ambulatory Visit (INDEPENDENT_AMBULATORY_CARE_PROVIDER_SITE_OTHER): Payer: Medicare Other | Admitting: Internal Medicine

## 2017-12-07 ENCOUNTER — Other Ambulatory Visit: Payer: Self-pay

## 2017-12-07 ENCOUNTER — Encounter: Payer: Self-pay | Admitting: Internal Medicine

## 2017-12-07 VITALS — BP 110/70 | HR 70 | Ht 68.0 in | Wt 171.6 lb

## 2017-12-07 DIAGNOSIS — Z95810 Presence of automatic (implantable) cardiac defibrillator: Secondary | ICD-10-CM | POA: Diagnosis not present

## 2017-12-07 DIAGNOSIS — Z952 Presence of prosthetic heart valve: Secondary | ICD-10-CM

## 2017-12-07 DIAGNOSIS — I5022 Chronic systolic (congestive) heart failure: Secondary | ICD-10-CM

## 2017-12-07 LAB — CUP PACEART INCLINIC DEVICE CHECK
Battery Voltage: 2.6 V
Brady Statistic RA Percent Paced: 3.3 %
Date Time Interrogation Session: 20190604163811
Implantable Lead Implant Date: 20120905
Implantable Lead Location: 753859
Lead Channel Impedance Value: 400 Ohm
Lead Channel Impedance Value: 612.5 Ohm
Lead Channel Pacing Threshold Amplitude: 1 V
Lead Channel Pacing Threshold Amplitude: 1.25 V
Lead Channel Pacing Threshold Pulse Width: 0.4 ms
Lead Channel Pacing Threshold Pulse Width: 1 ms
Lead Channel Setting Pacing Amplitude: 2 V
Lead Channel Setting Pacing Amplitude: 2.375
Lead Channel Setting Pacing Pulse Width: 0.4 ms
Lead Channel Setting Sensing Sensitivity: 2 mV
MDC IDC LEAD IMPLANT DT: 20120905
MDC IDC LEAD IMPLANT DT: 20120905
MDC IDC LEAD LOCATION: 753858
MDC IDC LEAD LOCATION: 753860
MDC IDC MSMT BATTERY REMAINING LONGEVITY: 0 mo
MDC IDC MSMT LEADCHNL LV PACING THRESHOLD AMPLITUDE: 1 V
MDC IDC MSMT LEADCHNL LV PACING THRESHOLD PULSEWIDTH: 1 ms
MDC IDC MSMT LEADCHNL RA IMPEDANCE VALUE: 362.5 Ohm
MDC IDC MSMT LEADCHNL RA SENSING INTR AMPL: 1.6 mV
MDC IDC MSMT LEADCHNL RV PACING THRESHOLD AMPLITUDE: 1.25 V
MDC IDC MSMT LEADCHNL RV PACING THRESHOLD PULSEWIDTH: 0.4 ms
MDC IDC MSMT LEADCHNL RV SENSING INTR AMPL: 12 mV
MDC IDC PG IMPLANT DT: 20120905
MDC IDC SET LEADCHNL LV PACING PULSEWIDTH: 1 ms
MDC IDC SET LEADCHNL RA PACING AMPLITUDE: 2 V
MDC IDC STAT BRADY RV PERCENT PACED: 70 %
Pulse Gen Model: 3210
Pulse Gen Serial Number: 2665541

## 2017-12-07 NOTE — Patient Instructions (Addendum)
Medication Instructions:  Your physician recommends that you continue on your current medications as directed. Please refer to the Current Medication list given to you today.  Labwork: You will have labs drawn today: CBC, BMP, PT/INR   Testing/Procedures: Your physician has recommended that you have a pacemaker battery change out. A pacemaker is a small device that is placed under the skin of your chest or abdomen to help control abnormal heart rhythms. This device uses electrical pulses to prompt the heart to beat at a normal rate. Pacemakers are used to treat heart rhythms that are too slow. Wire (leads) are attached to the pacemaker that goes into the chambers of you heart. This is done in the hospital and usually requires and overnight stay. Please see the instruction sheet given to you today for more information.   Follow-Up: Your physician recommends that you schedule a follow-up appointment in:   10-14 days after 6/10 for a wound check 91 days after 6/10 with Dr Graciela Husbands for a follow up   Any Other Special Instructions Will Be Listed Below (If Applicable).     If you need a refill on your cardiac medications before your next appointment, please call your pharmacy.

## 2017-12-07 NOTE — Addendum Note (Signed)
Addended by: Tonita Phoenix on: 12/07/2017 04:13 PM   Modules accepted: Orders

## 2017-12-07 NOTE — Progress Notes (Signed)
Patient Care Team: [name], DO as PCP - General (Internal Medicine)   HPI  [name] is a 72 y.o. male Seen in followup for CRT-P  Implanted September 2012 for profound first degree AV block in the setting of a modest valvular cardiomyopathy ejection fraction 35-40%.    He is s/p mechanical MVR for endocarditis associated with IVDA. Most recent echo 6/14 demonstrated ejection fraction 50% and stable mitral valve replacement   There has been a challenge was compliance of anticoagulation; he takes only aspirin  He complains of a lack of energy.  Reviewing hospital laboratories he had a hemoglobin of 7 in early September. It was higher a month later and does not been repeated. Last TSH in the computer is 2009.  Date Cr K Hgb  1/18   11.9  6/19 1.28 3.9              Past Medical History:  Diagnosis Date  . Atrial fibrillation (HCC)    post op.  s/p dc-cv 04/2008. previously on amiodarone  . Atrial flutter (HCC)    atypical  . AV block, 1st degree    .450 msec--progressive  . 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  a- Echo 07/2008 Ef 55% mild peroprosthetic MVR with High transmitral gradient (mean 14).  b- normal coronaries by cath 12/2007  . BPH (benign prostatic hyperplasia)   . CHF (congestive heart failure) (HCC)    EF 35-40 % 2011 due to valvular disease and diastolic dysfunction  . Chronic back pain   . CKD (chronic kidney disease), stage III (HCC)   . Dysphagia    with normal barium swallow 09/2008  . Endocarditis   . GERD (gastroesophageal reflux disease)   . Heavy alcohol use    history  . History of GI bleed   . Hypertension   . IV drug abuse (HCC)    history of  . Lower GI bleed 01/2016  . Pacemaker 2010    Past Surgical History:  Procedure Laterality Date  . CARDIAC VALVE REPLACEMENT     mitral valve, st jude mechanical   . COLONOSCOPY N/A 03/26/2014   Procedure: COLONOSCOPY;  Surgeon: Rachael Fee, MD;  Location: [facility] ENDOSCOPY;  Service: Endoscopy;  Laterality: N/A;  . ENTEROSCOPY N/A 03/28/2014   Procedure: ENTEROSCOPY;  Surgeon: Rachael Fee, MD;  Location: [facility] ENDOSCOPY;  Service: Endoscopy;  Laterality: N/A;  . ESOPHAGOGASTRODUODENOSCOPY N/A 03/25/2014   Procedure: ESOPHAGOGASTRODUODENOSCOPY (EGD);  Surgeon: Iva Boop, MD;  Location: [facility] ENDOSCOPY;  Service: Endoscopy;  Laterality: N/A;  . GIVENS CAPSULE STUDY N/A 03/26/2014   Procedure: GIVENS CAPSULE STUDY;  Surgeon: Rachael Fee, MD;  Location: [facility] ENDOSCOPY;  Service: Endoscopy;  Laterality: N/A;  . PACEMAKER INSERTION     inserted about 4-5 years ago    Current Outpatient Medications  Medication Sig Dispense Refill  . acetaminophen (TYLENOL) 500 MG tablet Take 1,000 mg by mouth every 6 (six) hours as needed for mild pain.    . carvedilol (COREG) 6.25 MG tablet Take 1 tablet (6.25 mg total) by mouth 2 (two) times daily with a meal. 60 tablet 2  . finasteride (PROSCAR) 5 MG tablet Take 1 tablet (5 mg total) by mouth daily. 30 tablet 5  . furosemide (LASIX) 20 MG tablet TAKE 1 TABLET (20 MG TOTAL) BY MOUTH DAILY. 90 tablet 3  . lisinopril (PRINIVIL,ZESTRIL) 2.5 MG tablet TAKE 1 TABLET  TWICE A DAY 180 tablet 3  . spironolactone (ALDACTONE) 25 MG tablet TAKE 1 TABLET (25 MG TOTAL) BY MOUTH DAILY. NEEDS OFFICE VISIT [phone] 30 tablet 0  . warfarin (COUMADIN) 5 MG tablet 1 TABLET BY MOUTH DAILY AT 6PM ALL DAYS OF WEEK EXCEPT FRI ONLY TAKE 1/2 TABLET 30 tablet 2   No current facility-administered medications for this visit.     No Known Allergies  Review of Systems negative except from HPI and PMH  Physical Exam BP 110/70   Pulse 70   Ht 5\' 8"  (1.727 m)   Wt 171 lb 9.6 oz (77.8 kg)   SpO2 99%   BMI 26.09 kg/m  Well developed and nourished in no acute distress HENT normal Device pocket well healed; without hematoma or erythema.  There is no tethering  Neck supple with JVP-flat Clear Regular rate and  rhythm, mechanical S1 90 2/6 systolic murmur  abd-soft with active BS No Clubbing cyanosis tr edema Skin-warm and dry A & Oriented  Grossly normal sensory and motor function    Assessment and  Plan  Nonischemic cardiomyopathy  Congestive heart failure chronic systolic  Fatigue  Mitral valve replacement-Saint Jude  Atrial fibrillation-persistent  Renal insufficiency grade 3  Pacemaker-CRT-St. Jude at [facility]    His device is at [facility].  Is appropriate to undertake replacement.  Ventricular pacing percentage 70% because of frequent atrial fibrillation  Antiarrhythmic therapy would be indicated.  Would favor amiodarone given compliance issues as well as variable renal function    This would result in needing to re-dose his amiodarone.  No bleeding but no CBC that I can find in more than a year.  We have reviewed the benefits and risks of generator replacement.  These include but are not limited to lead fracture and infection.  The patient understands, agrees and is willing to proceed.    Will need to resume ASA 81 we will begin post implant

## 2017-12-07 NOTE — H&P (View-Only) (Signed)
Patient Care Team: [name], DO as PCP - General (Internal Medicine)   HPI  [name] is a 72 y.o. male Seen in followup for CRT-P  Implanted September 2012 for profound first degree AV block in the setting of a modest valvular cardiomyopathy ejection fraction 35-40%.    He is s/p mechanical MVR for endocarditis associated with IVDA. Most recent echo 6/14 demonstrated ejection fraction 50% and stable mitral valve replacement   There has been a challenge was compliance of anticoagulation; he takes only aspirin  He complains of a lack of energy.  Reviewing hospital laboratories he had a hemoglobin of 7 in early September. It was higher a month later and does not been repeated. Last TSH in the computer is 2009.  Date Cr K Hgb  1/18   11.9  6/19 1.28 3.9              Past Medical History:  Diagnosis Date  . Atrial fibrillation (HCC)    post op.  s/p dc-cv 04/2008. previously on amiodarone  . Atrial flutter (HCC)    atypical  . AV block, 1st degree    .450 msec--progressive  . 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  a- Echo 07/2008 Ef 55% mild peroprosthetic MVR with High transmitral gradient (mean 14).  b- normal coronaries by cath 12/2007  . BPH (benign prostatic hyperplasia)   . CHF (congestive heart failure) (HCC)    EF 35-40 % 2011 due to valvular disease and diastolic dysfunction  . Chronic back pain   . CKD (chronic kidney disease), stage III (HCC)   . Dysphagia    with normal barium swallow 09/2008  . Endocarditis   . GERD (gastroesophageal reflux disease)   . Heavy alcohol use    history  . History of GI bleed   . Hypertension   . IV drug abuse (HCC)    history of  . Lower GI bleed 01/2016  . Pacemaker 2010    Past Surgical History:  Procedure Laterality Date  . CARDIAC VALVE REPLACEMENT     mitral valve, st jude mechanical   . COLONOSCOPY N/A 03/26/2014   Procedure: COLONOSCOPY;  Surgeon: Rachael Fee, MD;  Location: [facility] ENDOSCOPY;  Service: Endoscopy;  Laterality: N/A;  . ENTEROSCOPY N/A 03/28/2014   Procedure: ENTEROSCOPY;  Surgeon: Rachael Fee, MD;  Location: [facility] ENDOSCOPY;  Service: Endoscopy;  Laterality: N/A;  . ESOPHAGOGASTRODUODENOSCOPY N/A 03/25/2014   Procedure: ESOPHAGOGASTRODUODENOSCOPY (EGD);  Surgeon: Iva Boop, MD;  Location: [facility] ENDOSCOPY;  Service: Endoscopy;  Laterality: N/A;  . GIVENS CAPSULE STUDY N/A 03/26/2014   Procedure: GIVENS CAPSULE STUDY;  Surgeon: Rachael Fee, MD;  Location: [facility] ENDOSCOPY;  Service: Endoscopy;  Laterality: N/A;  . PACEMAKER INSERTION     inserted about 4-5 years ago    Current Outpatient Medications  Medication Sig Dispense Refill  . acetaminophen (TYLENOL) 500 MG tablet Take 1,000 mg by mouth every 6 (six) hours as needed for mild pain.    . carvedilol (COREG) 6.25 MG tablet Take 1 tablet (6.25 mg total) by mouth 2 (two) times daily with a meal. 60 tablet 2  . finasteride (PROSCAR) 5 MG tablet Take 1 tablet (5 mg total) by mouth daily. 30 tablet 5  . furosemide (LASIX) 20 MG tablet TAKE 1 TABLET (20 MG TOTAL) BY MOUTH DAILY. 90 tablet 3  . lisinopril (PRINIVIL,ZESTRIL) 2.5 MG tablet TAKE 1 TABLET  TWICE A DAY 180 tablet 3  . spironolactone (ALDACTONE) 25 MG tablet TAKE 1 TABLET (25 MG TOTAL) BY MOUTH DAILY. NEEDS OFFICE VISIT [phone] 30 tablet 0  . warfarin (COUMADIN) 5 MG tablet 1 TABLET BY MOUTH DAILY AT 6PM ALL DAYS OF WEEK EXCEPT FRI ONLY TAKE 1/2 TABLET 30 tablet 2   No current facility-administered medications for this visit.     No Known Allergies  Review of Systems negative except from HPI and PMH  Physical Exam BP 110/70   Pulse 70   Ht 5\' 8"  (1.727 m)   Wt 171 lb 9.6 oz (77.8 kg)   SpO2 99%   BMI 26.09 kg/m  Well developed and nourished in no acute distress HENT normal Device pocket well healed; without hematoma or erythema.  There is no tethering  Neck supple with JVP-flat Clear Regular rate and  rhythm, mechanical S1 90 2/6 systolic murmur  abd-soft with active BS No Clubbing cyanosis tr edema Skin-warm and dry A & Oriented  Grossly normal sensory and motor function    Assessment and  Plan  Nonischemic cardiomyopathy  Congestive heart failure chronic systolic  Fatigue  Mitral valve replacement-Saint Jude  Atrial fibrillation-persistent  Renal insufficiency grade 3  Pacemaker-CRT-St. Jude at [facility]    His device is at [facility].  Is appropriate to undertake replacement.  Ventricular pacing percentage 70% because of frequent atrial fibrillation  Antiarrhythmic therapy would be indicated.  Would favor amiodarone given compliance issues as well as variable renal function    This would result in needing to re-dose his amiodarone.  No bleeding but no CBC that I can find in more than a year.  We have reviewed the benefits and risks of generator replacement.  These include but are not limited to lead fracture and infection.  The patient understands, agrees and is willing to proceed.    Will need to resume ASA 81 we will begin post implant

## 2017-12-07 NOTE — Telephone Encounter (Signed)
Questions about med. Please call pt back.  

## 2017-12-07 NOTE — Telephone Encounter (Signed)
Rtc, rang until ph cutoff automatically

## 2017-12-08 LAB — CBC WITH DIFFERENTIAL/PLATELET
BASOS ABS: 0 10*3/uL (ref 0.0–0.2)
BASOS: 0 %
EOS (ABSOLUTE): 0.1 10*3/uL (ref 0.0–0.4)
EOS: 2 %
HEMOGLOBIN: 13 g/dL (ref 13.0–17.7)
Hematocrit: 40.3 % (ref 37.5–51.0)
IMMATURE GRANS (ABS): 0 10*3/uL (ref 0.0–0.1)
IMMATURE GRANULOCYTES: 0 %
Lymphocytes Absolute: 1.5 10*3/uL (ref 0.7–3.1)
Lymphs: 28 %
MCH: 29 pg (ref 26.6–33.0)
MCHC: 32.3 g/dL (ref 31.5–35.7)
MCV: 90 fL (ref 79–97)
Monocytes Absolute: 0.5 10*3/uL (ref 0.1–0.9)
Monocytes: 10 %
Neutrophils Absolute: 3.2 10*3/uL (ref 1.4–7.0)
Neutrophils: 60 %
PLATELETS: 322 10*3/uL (ref 150–450)
RBC: 4.49 x10E6/uL (ref 4.14–5.80)
RDW: 15.4 % (ref 12.3–15.4)
WBC: 5.5 10*3/uL (ref 3.4–10.8)

## 2017-12-08 LAB — BASIC METABOLIC PANEL
BUN/Creatinine Ratio: 10 (ref 10–24)
BUN: 14 mg/dL (ref 8–27)
CO2: 22 mmol/L (ref 20–29)
CREATININE: 1.43 mg/dL — AB (ref 0.76–1.27)
Calcium: 9.6 mg/dL (ref 8.6–10.2)
Chloride: 103 mmol/L (ref 96–106)
GFR calc Af Amer: 55 mL/min/{1.73_m2} — ABNORMAL LOW (ref >59)
GFR, EST NON AFRICAN AMERICAN: 48 mL/min/{1.73_m2} — AB (ref >59)
GLUCOSE: 89 mg/dL (ref 65–99)
Potassium: 3.8 mmol/L (ref 3.5–5.2)
SODIUM: 138 mmol/L (ref 134–144)

## 2017-12-08 LAB — PROTIME-INR
INR: 3.3 — AB (ref 0.8–1.2)
PROTHROMBIN TIME: 32.4 s — AB (ref 9.1–12.0)

## 2017-12-09 ENCOUNTER — Encounter: Payer: Medicare Other | Admitting: Internal Medicine

## 2017-12-09 ENCOUNTER — Ambulatory Visit: Payer: Medicare Other

## 2017-12-09 ENCOUNTER — Other Ambulatory Visit: Payer: Self-pay

## 2017-12-09 NOTE — Progress Notes (Signed)
Patient visit scheduled in error.  No visit today.

## 2017-12-10 ENCOUNTER — Other Ambulatory Visit: Payer: Self-pay

## 2017-12-10 ENCOUNTER — Ambulatory Visit (HOSPITAL_COMMUNITY): Payer: Medicare Other | Attending: Internal Medicine

## 2017-12-10 ENCOUNTER — Telehealth: Payer: Self-pay | Admitting: Internal Medicine

## 2017-12-10 DIAGNOSIS — N189 Chronic kidney disease, unspecified: Secondary | ICD-10-CM | POA: Insufficient documentation

## 2017-12-10 DIAGNOSIS — I130 Hypertensive heart and chronic kidney disease with heart failure and stage 1 through stage 4 chronic kidney disease, or unspecified chronic kidney disease: Secondary | ICD-10-CM | POA: Insufficient documentation

## 2017-12-10 DIAGNOSIS — I082 Rheumatic disorders of both aortic and tricuspid valves: Secondary | ICD-10-CM | POA: Insufficient documentation

## 2017-12-10 DIAGNOSIS — I5022 Chronic systolic (congestive) heart failure: Secondary | ICD-10-CM | POA: Insufficient documentation

## 2017-12-10 NOTE — Telephone Encounter (Signed)
Reviewed pre procedure medication instructions with pt's wife/SO. Per Dr Graciela Husbands, pt is to take 1/2 dose of coumadin 6/7 and 6/8. He will return to his whole dose of coumadin on Sunday 6/9. Pt is to hold spironolactone and lasix the morning of his procedure and go back to his normal dose of coumadin when he returns home from his procedure. Medication instructions were reviewed several times. Pt's wife verbalized understanding and had no additional questions.

## 2017-12-10 NOTE — Telephone Encounter (Signed)
Follow up   Pt states she is returning call for nurse. Please call

## 2017-12-10 NOTE — Telephone Encounter (Signed)
Pt friend [name] calling to ask questions regarding med prior to procedure Monday-pls call [phone]

## 2017-12-13 ENCOUNTER — Ambulatory Visit (HOSPITAL_COMMUNITY)
Admission: RE | Admit: 2017-12-13 | Discharge: 2017-12-13 | Disposition: A | Discharge status: Home / Self Care | Payer: Medicare Other | Source: Ambulatory Visit | Attending: Internal Medicine | Admitting: Internal Medicine

## 2017-12-13 ENCOUNTER — Encounter (HOSPITAL_COMMUNITY)
Admission: RE | Disposition: A | Discharge status: Home / Self Care | Payer: Self-pay | Source: Ambulatory Visit | Attending: Internal Medicine

## 2017-12-13 DIAGNOSIS — I428 Other cardiomyopathies: Secondary | ICD-10-CM | POA: Diagnosis not present

## 2017-12-13 DIAGNOSIS — Z45010 Encounter for checking and testing of cardiac pacemaker pulse generator [battery]: Secondary | ICD-10-CM | POA: Insufficient documentation

## 2017-12-13 DIAGNOSIS — K219 Gastro-esophageal reflux disease without esophagitis: Secondary | ICD-10-CM | POA: Diagnosis not present

## 2017-12-13 DIAGNOSIS — I440 Atrioventricular block, first degree: Secondary | ICD-10-CM | POA: Insufficient documentation

## 2017-12-13 DIAGNOSIS — G8929 Other chronic pain: Secondary | ICD-10-CM | POA: Diagnosis not present

## 2017-12-13 DIAGNOSIS — Z952 Presence of prosthetic heart valve: Secondary | ICD-10-CM | POA: Insufficient documentation

## 2017-12-13 DIAGNOSIS — I4892 Unspecified atrial flutter: Secondary | ICD-10-CM | POA: Insufficient documentation

## 2017-12-13 DIAGNOSIS — I481 Persistent atrial fibrillation: Secondary | ICD-10-CM | POA: Diagnosis not present

## 2017-12-13 DIAGNOSIS — I5022 Chronic systolic (congestive) heart failure: Secondary | ICD-10-CM | POA: Insufficient documentation

## 2017-12-13 DIAGNOSIS — N183 Chronic kidney disease, stage 3 (moderate): Secondary | ICD-10-CM | POA: Insufficient documentation

## 2017-12-13 DIAGNOSIS — M549 Dorsalgia, unspecified: Secondary | ICD-10-CM | POA: Insufficient documentation

## 2017-12-13 DIAGNOSIS — N400 Enlarged prostate without lower urinary tract symptoms: Secondary | ICD-10-CM | POA: Insufficient documentation

## 2017-12-13 DIAGNOSIS — Z7901 Long term (current) use of anticoagulants: Secondary | ICD-10-CM | POA: Diagnosis not present

## 2017-12-13 DIAGNOSIS — I130 Hypertensive heart and chronic kidney disease with heart failure and stage 1 through stage 4 chronic kidney disease, or unspecified chronic kidney disease: Secondary | ICD-10-CM | POA: Diagnosis not present

## 2017-12-13 DIAGNOSIS — Z950 Presence of cardiac pacemaker: Secondary | ICD-10-CM

## 2017-12-13 HISTORY — PX: BIV PACEMAKER GENERATOR CHANGEOUT: EP1198

## 2017-12-13 LAB — SURGICAL PCR SCREEN
MRSA, PCR: NEGATIVE
STAPHYLOCOCCUS AUREUS: POSITIVE — AB

## 2017-12-13 LAB — PROTIME-INR
INR: 2.64
Prothrombin Time: 28 seconds — ABNORMAL HIGH (ref 11.4–15.2)

## 2017-12-13 SURGERY — BIV PACEMAKER GENERATOR CHANGEOUT

## 2017-12-13 MED ORDER — CEFAZOLIN SODIUM-DEXTROSE 2-4 GM/100ML-% IV SOLN
2.0000 g | INTRAVENOUS | Status: AC
  Administered 2017-12-13: 2 g via INTRAVENOUS
  Filled 2017-12-13: qty 100

## 2017-12-13 MED ORDER — CHLORHEXIDINE GLUCONATE 4 % EX LIQD
60.0000 mL | Freq: Once | CUTANEOUS | Status: DC
  Filled 2017-12-13: qty 60

## 2017-12-13 MED ORDER — MUPIROCIN 2 % EX OINT
1.0000 "application " | TOPICAL_OINTMENT | Freq: Once | CUTANEOUS | Status: DC
  Filled 2017-12-13: qty 22

## 2017-12-13 MED ORDER — SODIUM CHLORIDE 0.9 % IV SOLN: INTRAVENOUS | Status: DC

## 2017-12-13 MED ORDER — CEFAZOLIN SODIUM-DEXTROSE 2-4 GM/100ML-% IV SOLN
INTRAVENOUS | Status: AC
  Filled 2017-12-13: qty 100

## 2017-12-13 MED ORDER — LIDOCAINE HCL (PF) 1 % IJ SOLN
INTRAMUSCULAR | Status: DC | PRN
  Administered 2017-12-13: 45 mL

## 2017-12-13 MED ORDER — SODIUM CHLORIDE 0.9 % IV SOLN
INTRAVENOUS | Status: DC
  Administered 2017-12-13: 09:00:00 via INTRAVENOUS

## 2017-12-13 MED ORDER — MIDAZOLAM HCL 5 MG/5ML IJ SOLN
INTRAMUSCULAR | Status: AC
  Filled 2017-12-13: qty 5

## 2017-12-13 MED ORDER — MIDAZOLAM HCL 5 MG/5ML IJ SOLN
INTRAMUSCULAR | Status: DC | PRN
  Administered 2017-12-13 (×3): 1 mg via INTRAVENOUS

## 2017-12-13 MED ORDER — SODIUM CHLORIDE 0.9 % IV SOLN
INTRAVENOUS | Status: AC
  Filled 2017-12-13: qty 2

## 2017-12-13 MED ORDER — ONDANSETRON HCL 4 MG/2ML IJ SOLN: 4.0000 mg | Freq: Four times a day (QID) | INTRAMUSCULAR | Status: DC | PRN

## 2017-12-13 MED ORDER — MUPIROCIN 2 % EX OINT
TOPICAL_OINTMENT | CUTANEOUS | Status: AC
  Administered 2017-12-13: 1
  Filled 2017-12-13: qty 22

## 2017-12-13 MED ORDER — FENTANYL CITRATE (PF) 100 MCG/2ML IJ SOLN
INTRAMUSCULAR | Status: AC
  Filled 2017-12-13: qty 2

## 2017-12-13 MED ORDER — LIDOCAINE HCL (PF) 1 % IJ SOLN
INTRAMUSCULAR | Status: AC
  Filled 2017-12-13: qty 60

## 2017-12-13 MED ORDER — FENTANYL CITRATE (PF) 100 MCG/2ML IJ SOLN
INTRAMUSCULAR | Status: DC | PRN
  Administered 2017-12-13: 25 ug via INTRAVENOUS
  Administered 2017-12-13 (×2): 12.5 ug via INTRAVENOUS

## 2017-12-13 MED ORDER — SODIUM CHLORIDE 0.9 % IV SOLN
80.0000 mg | INTRAVENOUS | Status: AC
  Administered 2017-12-13: 80 mg
  Filled 2017-12-13: qty 2

## 2017-12-13 MED ORDER — ACETAMINOPHEN 325 MG PO TABS
325.0000 mg | ORAL_TABLET | ORAL | Status: DC | PRN
  Filled 2017-12-13: qty 2

## 2017-12-13 SURGICAL SUPPLY — 6 items
CABLE SURGICAL S-101-97-12 (CABLE) ×2 IMPLANT
HEMOSTAT SURGICEL 2X4 FIBR (HEMOSTASIS) ×2 IMPLANT
PACEMAKER ALLR CRT-P RF PM3222 (Pacemaker) IMPLANT
PAD DEFIB LIFELINK (PAD) ×2 IMPLANT
PPM ALLURE CRT-P RF PM3222 (Pacemaker) ×3 IMPLANT
TRAY PACEMAKER INSERTION (PACKS) ×2 IMPLANT

## 2017-12-13 NOTE — Interval H&P Note (Signed)
History and Physical Interval Note:  12/13/2017 7:06 AM  [name]  has presented today for surgery, with the diagnosis of eri  The various methods of treatment have been discussed with the patient and family. After consideration of risks, benefits and other options for treatment, the patient has consented to  Procedure(s): PPM GENERATOR CHANGEOUT (N/A) as a surgical intervention .  The patient's history has been reviewed, patient examined, no change in status, stable for surgery.  I have reviewed the patient's chart and labs.  Questions were answered to the patient's satisfaction.     Sherryl Manges

## 2017-12-13 NOTE — Discharge Instructions (Signed)
Post procedure care instructions Keep incision clean and dry for 24 hours. No driving for 4 days.  Call the office [phone]) for redness, drainage, swelling, or fever.   Dermabond will come off in next 10-14 days; if not will remove at wound check  Keep wound dry until tomorrow evening No Driving x 4 days Wound check in office as scheduled

## 2017-12-14 ENCOUNTER — Encounter (HOSPITAL_COMMUNITY): Payer: Self-pay | Admitting: Internal Medicine

## 2017-12-17 ENCOUNTER — Other Ambulatory Visit (HOSPITAL_COMMUNITY): Payer: Self-pay | Admitting: Cardiology

## 2017-12-17 ENCOUNTER — Other Ambulatory Visit: Payer: Self-pay | Admitting: Internal Medicine

## 2017-12-17 DIAGNOSIS — I5022 Chronic systolic (congestive) heart failure: Secondary | ICD-10-CM

## 2017-12-20 ENCOUNTER — Other Ambulatory Visit (HOSPITAL_COMMUNITY): Payer: Self-pay | Admitting: Cardiology

## 2017-12-23 ENCOUNTER — Ambulatory Visit (INDEPENDENT_AMBULATORY_CARE_PROVIDER_SITE_OTHER): Payer: Medicare Other | Admitting: *Deleted

## 2017-12-23 ENCOUNTER — Encounter (INDEPENDENT_AMBULATORY_CARE_PROVIDER_SITE_OTHER): Payer: Self-pay

## 2017-12-23 DIAGNOSIS — I4819 Other persistent atrial fibrillation: Secondary | ICD-10-CM

## 2017-12-23 DIAGNOSIS — I481 Persistent atrial fibrillation: Secondary | ICD-10-CM

## 2017-12-23 DIAGNOSIS — Z950 Presence of cardiac pacemaker: Secondary | ICD-10-CM

## 2017-12-23 DIAGNOSIS — I5022 Chronic systolic (congestive) heart failure: Secondary | ICD-10-CM | POA: Diagnosis not present

## 2017-12-23 LAB — CUP PACEART INCLINIC DEVICE CHECK
Battery Remaining Longevity: 94 mo
Battery Voltage: 3.07 V
Brady Statistic RA Percent Paced: 0 %
Implantable Lead Implant Date: 20120905
Implantable Lead Location: 753859
Implantable Lead Location: 753860
Implantable Pulse Generator Implant Date: 20190610
Lead Channel Impedance Value: 400 Ohm
Lead Channel Impedance Value: 625 Ohm
Lead Channel Pacing Threshold Amplitude: 1 V
Lead Channel Pacing Threshold Pulse Width: 0.4 ms
Lead Channel Pacing Threshold Pulse Width: 0.5 ms
Lead Channel Setting Pacing Amplitude: 2 V
Lead Channel Setting Pacing Pulse Width: 0.4 ms
Lead Channel Setting Sensing Sensitivity: 2 mV
MDC IDC LEAD IMPLANT DT: 20120905
MDC IDC LEAD IMPLANT DT: 20120905
MDC IDC LEAD LOCATION: 753858
MDC IDC MSMT LEADCHNL LV PACING THRESHOLD AMPLITUDE: 1.125 V
MDC IDC MSMT LEADCHNL RV SENSING INTR AMPL: 12 mV
MDC IDC PG SERIAL: 9022287
MDC IDC SESS DTM: 20190620135623
MDC IDC SET LEADCHNL LV PACING AMPLITUDE: 2.125
MDC IDC SET LEADCHNL RV PACING PULSEWIDTH: 0.5 ms

## 2017-12-23 NOTE — Patient Instructions (Addendum)
-   You have an INR check (for your coumadin) on Monday, 12/27/17 at 9:00am at [facility].  - Call the Device Clinic at [phone] if you notice any signs/symptoms of infection, including drainage, increased swelling, fever, or chills.  You may need to go to the ER if you experience any of these symptoms over the weekend.

## 2017-12-23 NOTE — Progress Notes (Signed)
Wound check appointment. Dermabond partially removed by patient prior to appointment. Wound without redness or drainage. Device site slightly swollen but soft to palpation, +warfarin (mechanical valve). Swelling unchanged since hospital d/c per patient. Incision edges approximated, incision healing well. See included photo for comparison. Patient overdue for INR check. Able to call PCP office and get an appt for 12/27/17. Plan for wound recheck while SK in the office on 12/28/17.  Normal device function. Thresholds, sensing, and impedances consistent with implant measurements. Device programmed at chronic outputs. Histogram distribution appropriate for patient and level of activity. BiV pacing 88%. Persistent AF, +warfarin. No high ventricular rates noted. Patient educated about wound care, arm mobility, and Merlin monitor. ROV with SK on 03/21/18.  Patient gave verbal permission to include the following photo:

## 2017-12-27 ENCOUNTER — Ambulatory Visit (INDEPENDENT_AMBULATORY_CARE_PROVIDER_SITE_OTHER): Payer: Medicare Other

## 2017-12-27 DIAGNOSIS — Z952 Presence of prosthetic heart valve: Secondary | ICD-10-CM

## 2017-12-27 LAB — POCT INR: INR: 3 (ref 2.0–3.0)

## 2017-12-27 NOTE — Progress Notes (Signed)
Anticoagulation Management [name] is a 72 y.o. male who reports to the clinic for monitoring of warfarin treatment.    Indication: irregular pulse, long term use of anticoagulants, atrial flutter  Duration: indefinite Supervising physician: Debe Coder  Anticoagulation Clinic Visit History: Patient does not report signs/symptoms of bleeding or thromboembolism  Other recent changes: Denies any diet, medications, lifestyle changes Anticoagulation Episode Summary    Current INR goal:   2.5-3.5  TTR:   66.7 % (5.3 y)  Next INR check:   01/17/2018  INR from last check:   3.0 (12/27/2017)  Weekly max warfarin dose:     Target end date:   Indefinite  INR check location:   Anticoagulation Clinic  Preferred lab:     Send INR reminders to:   [facility] [address]   Indications   IRREGULAR PULSE [I49.9] Long term (current) use of anticoagulants (Resolved) [Z79.01] ATRIAL FLUTTER (Resolved) [I48.92] H/O mitral valve replacement with mechanical valve [Z95.2]       Comments:         Anticoagulation Care Providers    Provider Role Specialty Phone number   [name], MD  Cardiology [phone]      No Known Allergies Prior to Admission medications   Medication Sig Start Date End Date Taking? Authorizing Provider  acetaminophen (TYLENOL) 500 MG tablet Take 1,000 mg by mouth every 6 (six) hours as needed for mild pain.    [provider]  carvedilol (COREG) 6.25 MG tablet TAKE 1 TABLET (6.25 MG TOTAL) BY MOUTH 2 (TWO) TIMES DAILY WITH A MEAL. 12/21/17   [name], DO  finasteride (PROSCAR) 5 MG tablet Take 1 tablet (5 mg total) by mouth daily. 07/27/17   [name], DO  furosemide (LASIX) 20 MG tablet TAKE 1 TABLET (20 MG TOTAL) BY MOUTH DAILY. 12/04/16   Bensimhon, Bevelyn Buckles, MD  lisinopril (PRINIVIL,ZESTRIL) 2.5 MG tablet TAKE 1 TABLET TWICE A DAY 11/30/17   Bensimhon, Bevelyn Buckles, MD  potassium chloride SA (K-DUR,KLOR-CON) 20 MEQ tablet Take 20 mEq  by mouth daily.    [provider]  spironolactone (ALDACTONE) 25 MG tablet TAKE 1 TABLET (25 MG TOTAL) BY MOUTH DAILY. NEEDS OFFICE VISIT [phone] 12/20/17   Bensimhon, Bevelyn Buckles, MD  warfarin (COUMADIN) 5 MG tablet 1 TABLET BY MOUTH DAILY AT 6PM ALL DAYS OF WEEK EXCEPT FRI ONLY TAKE 1/2 TABLET Patient taking differently: Take 2.5-5 mg by mouth See admin instructions. 2.5 mg on Wed and Fri, and 5 mg all other days 10/19/17   [name], DO   Past Medical History:  Diagnosis Date  . Atrial fibrillation (HCC)    post op.  s/p dc-cv 04/2008. previously on amiodarone  . Atrial flutter (HCC)    atypical  . AV block, 1st degree    .450 msec--progressive  . 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  a- Echo 07/2008 Ef 55% mild peroprosthetic MVR with High transmitral gradient (mean 14).  b- normal coronaries by cath 12/2007  . BPH (benign prostatic hyperplasia)   . CHF (congestive heart failure) (HCC)    EF 35-40 % 2011 due to valvular disease and diastolic dysfunction  . Chronic back pain   . CKD (chronic kidney disease), stage III (HCC)   . Dysphagia    with normal barium swallow 09/2008  . Endocarditis   . GERD (gastroesophageal reflux disease)   . Heavy alcohol use    history  .  History of GI bleed   . Hypertension   . IV drug abuse (HCC)    history of  . Lower GI bleed 01/2016  . Pacemaker 2010   Social History   Socioeconomic History  . Marital status: Divorced    Spouse name: Not on file  . Number of children: 9  . Years of education: Not on file  . Highest education level: Not on file  Occupational History  . Occupation: retired  Engineer, production  . Financial resource strain: Not on file  . Food insecurity:    Worry: Not on file    Inability: Not on file  . Transportation needs:    Medical: Not on file    Non-medical: Not on file  Tobacco Use  . Smoking status: Former Smoker    Years: 0.00    Types: Cigarettes  .  Smokeless tobacco: Never Used  Substance and Sexual Activity  . Alcohol use: No    Alcohol/week: 0.0 oz  . Drug use: No  . Sexual activity: Not on file  Lifestyle  . Physical activity:    Days per week: Not on file    Minutes per session: Not on file  . Stress: Not on file  Relationships  . Social connections:    Talks on phone: Not on file    Gets together: Not on file    Attends religious service: Not on file    Active member of club or organization: Not on file    Attends meetings of clubs or organizations: Not on file    Relationship status: Not on file  Other Topics Concern  . Not on file  Social History Narrative   Exercise everyday riding a bike for 1 hour   Family History  Problem Relation Age of Onset  . Coronary artery disease Mother     ASSESSMENT Recent Results: The most recent result is correlated with 30 mg per week: Lab Results  Component Value Date   INR 3.0 12/27/2017   INR 2.64 12/13/2017   INR 3.3 (H) 12/07/2017    Anticoagulation Dosing: Description   Takes 1 of the 5mg  tablets once daily except on Wednesdays and Fridays, take 1/2 tablet.     INR today: Therapeutic  PLAN Weekly dose was unchanged   Patient Instructions  Patient instructed to take medications as defined in the Anti-coagulation Track section of this encounter.  Patient instructed to take today's dose.  Patient verbalized understanding of these instructions.  Patient was instructed to take 1 of the 5mg  tablets once daily except on Wednesdays and Fridays, take 1/2 tablet.   Patient advised to contact clinic or seek medical attention if signs/symptoms of bleeding or thromboembolism occur.  Patient verbalized understanding by repeating back information and was advised to contact me if further medication-related questions arise. Patient was also provided an information handout.  Follow-up Return in about 3 weeks (around 01/17/2018) for INR f/u @0930 .  Nolen Mu  PharmD PGY1 Acute Care Pharmacy Resident 12/27/2017 9:28 AM   15 minutes spent face-to-face with the patient during the encounter. 50% of time spent on education. 50% of time was spent on collection of INR sample and documentation into [email or web address] and the electronic medical record.

## 2017-12-27 NOTE — Patient Instructions (Signed)
Patient instructed to take medications as defined in the Anti-coagulation Track section of this encounter.  Patient instructed to take today's dose.  Patient verbalized understanding of these instructions.  Patient was instructed to take 1 of the 5mg  tablets once daily except on Wednesdays and Fridays, take 1/2 tablet.

## 2017-12-28 ENCOUNTER — Ambulatory Visit: Payer: Medicare Other | Admitting: *Deleted

## 2017-12-28 DIAGNOSIS — I5022 Chronic systolic (congestive) heart failure: Secondary | ICD-10-CM

## 2017-12-28 MED ORDER — ASPIRIN EC 81 MG PO TBEC: 81.0000 mg | DELAYED_RELEASE_TABLET | Freq: Every day | ORAL | 3 refills | Status: DC

## 2017-12-28 NOTE — Progress Notes (Signed)
Wound re-check. Area slightly swollen but soft to palpation. Patient educated on monitoring site for hardening of site or increased swelling. Patient verbalized understanding. Patient ordered ASA 81mg  verbal order by SK. Patient requested to purchase over the counter. ROV with SK 9/16

## 2017-12-28 NOTE — Patient Instructions (Signed)
-   Begin taking Aspirin 81mg  daily

## 2017-12-30 NOTE — Progress Notes (Signed)
I reviewed AC note.  Patient is on [facility] for mitral valve replacement and Aflutter.  INR at 3, which is at goal.

## 2018-01-01 ENCOUNTER — Encounter: Payer: Self-pay | Admitting: *Deleted

## 2018-01-07 ENCOUNTER — Telehealth (HOSPITAL_COMMUNITY): Payer: Self-pay | Admitting: *Deleted

## 2018-01-07 NOTE — Telephone Encounter (Signed)
Pt given echo results 

## 2018-01-14 ENCOUNTER — Other Ambulatory Visit (HOSPITAL_COMMUNITY): Payer: Self-pay | Admitting: Internal Medicine

## 2018-01-17 ENCOUNTER — Ambulatory Visit (INDEPENDENT_AMBULATORY_CARE_PROVIDER_SITE_OTHER): Payer: Medicare Other | Admitting: Pharmacist

## 2018-01-17 DIAGNOSIS — Z5181 Encounter for therapeutic drug level monitoring: Secondary | ICD-10-CM

## 2018-01-17 DIAGNOSIS — Z952 Presence of prosthetic heart valve: Secondary | ICD-10-CM

## 2018-01-17 DIAGNOSIS — Z8679 Personal history of other diseases of the circulatory system: Secondary | ICD-10-CM

## 2018-01-17 DIAGNOSIS — Z7901 Long term (current) use of anticoagulants: Secondary | ICD-10-CM

## 2018-01-17 LAB — POCT INR: INR: 3.5 — AB (ref 2.0–3.0)

## 2018-01-17 NOTE — Patient Instructions (Signed)
Patient instructed to take medications as defined in the Anti-coagulation Track section of this encounter.  Patient instructed to take today's dose.  Patient instructed to take 1 of the 5mg  tablets once daily except on Mondays, Wednesdays and Fridays, take 1/2 tablet. Patient verbalized understanding of these instructions.

## 2018-01-17 NOTE — Progress Notes (Signed)
Anticoagulation Management [name] is a 72 y.o. male who reports to the clinic for monitoring of warfarin treatment.    Indication: History of mitral valve replacement; long term current use of anticoagulants.    Duration: indefinite Supervising physician: Earl Lagos  Anticoagulation Clinic Visit History: Patient does not report signs/symptoms of bleeding or thromboembolism   Other recent changes: No diet, medications, lifestyle changes endorsed.  Anticoagulation Episode Summary    Current INR goal:   2.5-3.5  TTR:   67.1 % (5.3 y)  Next INR check:   02/14/2018  INR from last check:   3.5 (01/17/2018)  Weekly max warfarin dose:     Target end date:   Indefinite  INR check location:   Anticoagulation Clinic  Preferred lab:     Send INR reminders to:   [facility] [address]   Indications   IRREGULAR PULSE [I49.9] Long term (current) use of anticoagulants (Resolved) [Z79.01] ATRIAL FLUTTER (Resolved) [I48.92] H/O mitral valve replacement with mechanical valve [Z95.2]       Comments:         Anticoagulation Care Providers    Provider Role Specialty Phone number   [name], MD  Cardiology [phone]      No Known Allergies Prior to Admission medications   Medication Sig Start Date End Date Taking? Authorizing Provider  acetaminophen (TYLENOL) 500 MG tablet Take 1,000 mg by mouth every 6 (six) hours as needed for mild pain.   Yes [provider]  aspirin EC 81 MG tablet Take 1 tablet (81 mg total) by mouth daily. 12/28/17  Yes Duke Salvia, MD  carvedilol (COREG) 6.25 MG tablet TAKE 1 TABLET (6.25 MG TOTAL) BY MOUTH 2 (TWO) TIMES DAILY WITH A MEAL. 12/21/17  Yes [name], DO  finasteride (PROSCAR) 5 MG tablet Take 1 tablet (5 mg total) by mouth daily. 07/27/17  Yes [name], DO  furosemide (LASIX) 20 MG tablet TAKE 1 TABLET (20 MG TOTAL) BY MOUTH DAILY. 12/04/16  Yes Bensimhon, Bevelyn Buckles, MD  lisinopril (PRINIVIL,ZESTRIL)  2.5 MG tablet TAKE 1 TABLET TWICE A DAY 11/30/17  Yes Bensimhon, Bevelyn Buckles, MD  potassium chloride SA (K-DUR,KLOR-CON) 20 MEQ tablet Take 20 mEq by mouth daily.   Yes [provider]  spironolactone (ALDACTONE) 25 MG tablet TAKE 1 TABLET (25 MG TOTAL) BY MOUTH DAILY. NEEDS OFFICE VISIT [phone] 12/20/17  Yes Bensimhon, Bevelyn Buckles, MD  warfarin (COUMADIN) 5 MG tablet 1 TABLET BY MOUTH DAILY AT 6PM ALL DAYS OF WEEK EXCEPT FRI ONLY TAKE 1/2 TABLET Patient taking differently: Take 2.5-5 mg by mouth See admin instructions. 2.5 mg on Wed and Fri, and 5 mg all other days 10/19/17  Yes [name], DO   Past Medical History:  Diagnosis Date  . Atrial fibrillation (HCC)    post op.  s/p dc-cv 04/2008. previously on amiodarone  . Atrial flutter (HCC)    atypical  . AV block, 1st degree    .450 msec--progressive  . 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  a- Echo 07/2008 Ef 55% mild peroprosthetic MVR with High transmitral gradient (mean 14).  b- normal coronaries by cath 12/2007  . BPH (benign prostatic hyperplasia)   . CHF (congestive heart failure) (HCC)    EF 35-40 % 2011 due to valvular disease and diastolic dysfunction  . Chronic back pain   . CKD (chronic kidney disease), stage III (HCC)   . Dysphagia  with normal barium swallow 09/2008  . Endocarditis   . GERD (gastroesophageal reflux disease)   . Heavy alcohol use    history  . History of GI bleed   . Hypertension   . IV drug abuse (HCC)    history of  . Lower GI bleed 01/2016  . Pacemaker 2010   Social History   Socioeconomic History  . Marital status: Divorced    Spouse name: Not on file  . Number of children: 9  . Years of education: Not on file  . Highest education level: Not on file  Occupational History  . Occupation: retired  Engineer, production  . Financial resource strain: Not on file  . Food insecurity:    Worry: Not on file    Inability: Not on file  . Transportation  needs:    Medical: Not on file    Non-medical: Not on file  Tobacco Use  . Smoking status: Former Smoker    Years: 0.00    Types: Cigarettes  . Smokeless tobacco: Never Used  Substance and Sexual Activity  . Alcohol use: No    Alcohol/week: 0.0 oz  . Drug use: No  . Sexual activity: Not on file  Lifestyle  . Physical activity:    Days per week: Not on file    Minutes per session: Not on file  . Stress: Not on file  Relationships  . Social connections:    Talks on phone: Not on file    Gets together: Not on file    Attends religious service: Not on file    Active member of club or organization: Not on file    Attends meetings of clubs or organizations: Not on file    Relationship status: Not on file  Other Topics Concern  . Not on file  Social History Narrative   Exercise everyday riding a bike for 1 hour   Family History  Problem Relation Age of Onset  . Coronary artery disease Mother     ASSESSMENT Recent Results: The most recent result is correlated with 30 mg per week: Lab Results  Component Value Date   INR 3.5 (A) 01/17/2018   INR 3.0 12/27/2017   INR 2.64 12/13/2017    Anticoagulation Dosing: Description   Takes 1 of the 5mg  tablets once daily except on Mondays, Wednesdays and Fridays, take 1/2 tablet.     INR today: Therapeutic  PLAN Weekly dose was decreased by 8% to 27.5 mg per week  Patient Instructions  Patient instructed to take medications as defined in the Anti-coagulation Track section of this encounter.  Patient instructed to take today's dose.  Patient instructed to take 1 of the 5mg  tablets once daily except on Mondays, Wednesdays and Fridays, take 1/2 tablet. Patient verbalized understanding of these instructions.     Patient advised to contact clinic or seek medical attention if signs/symptoms of bleeding or thromboembolism occur.  Patient verbalized understanding by repeating back information and was advised to contact me if  further medication-related questions arise. Patient was also provided an information handout.  Follow-up Return in 1 month (on 02/14/2018) for Follow up INR at 0900h.  Elicia Lamp , PharmD, CACP, CPP 15 minutes spent face-to-face with the patient during the encounter. 50% of time spent on education. 50% of time was spent on fingerstick point of care INR sample collection, processing, results determination, dose adjustment and documentation in [email or web address]

## 2018-01-17 NOTE — Progress Notes (Signed)
INTERNAL MEDICINE TEACHING ATTENDING ADDENDUM - Adolpho Meenach M.D  Duration- indefinite, Indication- mechanical MVR, INR- therapeutic. Agree with pharmacy recommendations as outlined in their note.     

## 2018-01-26 ENCOUNTER — Other Ambulatory Visit: Payer: Self-pay

## 2018-01-26 ENCOUNTER — Ambulatory Visit (INDEPENDENT_AMBULATORY_CARE_PROVIDER_SITE_OTHER): Payer: Medicare Other | Admitting: Internal Medicine

## 2018-01-26 ENCOUNTER — Encounter: Payer: Self-pay | Admitting: Internal Medicine

## 2018-01-26 VITALS — BP 114/71 | HR 68 | Temp 99.3°F | Ht 68.0 in | Wt 178.5 lb

## 2018-01-26 DIAGNOSIS — I509 Heart failure, unspecified: Secondary | ICD-10-CM

## 2018-01-26 DIAGNOSIS — S9782XA Crushing injury of left foot, initial encounter: Secondary | ICD-10-CM | POA: Diagnosis not present

## 2018-01-26 DIAGNOSIS — W228XXA Striking against or struck by other objects, initial encounter: Secondary | ICD-10-CM | POA: Diagnosis not present

## 2018-01-26 DIAGNOSIS — S99922A Unspecified injury of left foot, initial encounter: Secondary | ICD-10-CM

## 2018-01-26 DIAGNOSIS — Z87891 Personal history of nicotine dependence: Secondary | ICD-10-CM | POA: Diagnosis not present

## 2018-01-26 MED ORDER — HYDROCODONE-ACETAMINOPHEN 5-325 MG PO TABS: 1.0000 | ORAL_TABLET | Freq: Four times a day (QID) | ORAL | 0 refills | Status: DC | PRN

## 2018-01-26 NOTE — Progress Notes (Signed)
CC: Left foot injury, initial encounter  HPI:  [name] is a 72 y.o. male with PMH below.  He is here to address traumatic injury to his left foot.  Please see A&P for status of the patient's chronic medical conditions  Past Medical History:  Diagnosis Date  . Atrial fibrillation (HCC)    post op.  s/p dc-cv 04/2008. previously on amiodarone  . Atrial flutter (HCC)    atypical  . AV block, 1st degree    .450 msec--progressive  . 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  a- Echo 07/2008 Ef 55% mild peroprosthetic MVR with High transmitral gradient (mean 14).  b- normal coronaries by cath 12/2007  . BPH (benign prostatic hyperplasia)   . CHF (congestive heart failure) (HCC)    EF 35-40 % 2011 due to valvular disease and diastolic dysfunction  . Chronic back pain   . CKD (chronic kidney disease), stage III (HCC)   . Dysphagia    with normal barium swallow 09/2008  . Endocarditis   . GERD (gastroesophageal reflux disease)   . Heavy alcohol use    history  . History of GI bleed   . Hypertension   . IV drug abuse (HCC)    history of  . Lower GI bleed 01/2016  . Pacemaker 2010   Review of Systems:  ROS: Pulmonary: pt denies increased work of breathing, shortness of breath,  Cardiac: pt denies palpitations, chest pain,  Abdominal: pt denies abdominal pain, nausea, vomiting, or diarrhea  Physical Exam:  Vitals:   01/26/18 1445  BP: 114/71  Pulse: 68  Temp: 99.3 F (37.4 C)  TempSrc: Oral  SpO2: 99%  Weight: 178 lb 8 oz (81 kg)  Height: 5\' 8"  (1.727 m)   Physical Exam  Eyes: Right eye exhibits no discharge. Left eye exhibits no discharge. No scleral icterus.  Neck: JVD (minimal JVD on exam) present.  Cardiovascular: Normal rate, regular rhythm, normal heart sounds and intact distal pulses. Exam reveals no gallop and no friction rub.  No murmur heard. Pulmonary/Chest: Effort normal and breath sounds normal. No respiratory  distress. He has no wheezes. He has no rales.  Musculoskeletal: He exhibits edema (2+ LE edema bilaterally).       Left ankle: He exhibits swelling. He exhibits normal range of motion, no ecchymosis and normal pulse. Tenderness.       Feet:  Preserved sensation  Neurological: He is alert.  Skin: He is not diaphoretic.    Media Information         Document Information   Photos    01/26/2018 14:57  Attached To:  Office Visit on 01/26/18 with Angelita Ingles, MD   Source Information   Takara Sermons, Kimberlee Nearing, MD  [facility]        Social History   Socioeconomic History  . Marital status: Divorced    Spouse name: Not on file  . Number of children: 9  . Years of education: Not on file  . Highest education level: Not on file  Occupational History  . Occupation: retired  Engineer, production  . Financial resource strain: Not on file  . Food insecurity:    Worry: Not on file    Inability: Not on file  . Transportation needs:    Medical: Not on file    Non-medical: Not on file  Tobacco Use  . Smoking status: Former Smoker    Years: 0.00  Types: Cigarettes  . Smokeless tobacco: Never Used  Substance and Sexual Activity  . Alcohol use: No    Alcohol/week: 0.0 oz  . Drug use: No  . Sexual activity: Not on file  Lifestyle  . Physical activity:    Days per week: Not on file    Minutes per session: Not on file  . Stress: Not on file  Relationships  . Social connections:    Talks on phone: Not on file    Gets together: Not on file    Attends religious service: Not on file    Active member of club or organization: Not on file    Attends meetings of clubs or organizations: Not on file    Relationship status: Not on file  . Intimate partner violence:    Fear of current or ex partner: Not on file    Emotionally abused: Not on file    Physically abused: Not on file    Forced sexual activity: Not on file  Other Topics Concern  . Not on file  Social  History Narrative   Exercise everyday riding a bike for 1 hour   Family History  Problem Relation Age of Onset  . Coronary artery disease Mother     Assessment & Plan:   See Encounters Tab for problem based charting.  Patient discussed with Dr. Cyndie Chime

## 2018-01-26 NOTE — Patient Instructions (Addendum)
Mr. Melka, please take the ace bandage I have provided, wrap up your foot with it.  I have provided some more detailed instructions below.  I have sent some pain medicine to your pharmacy to help for the next few days.  Please alternate ice and heat and keep it elevated further instructions on this are below.  I would also like you to take a double dose of your lasix pill today.   Crush Injury of the Foot When a crush injury of the foot occurs, many structures within the foot and ankle joint can be affected. This can result in a complicated injury that may involve:  One or more broken (fractured) bones.  Lacerations or abrasions of the skin. These increase your risk of infection.  Compressed or torn muscles.  Torn ligaments and tendons.  Broken blood vessels, causing bleeding within the tissues. This can lead to dangerously high pressure within the tissues (compartment syndrome).  Damage to nerves.  One or more toe amputations.  What are the causes? This type of injury can happen when a great amount of force is suddenly applied to the foot. This might occur:  During a motor vehicle accident.  If the foot is pulled into a machine during industrial or agricultural work.  If a heavy load falls directly onto the foot.  What are the signs or symptoms? Symptoms will vary depending on which structures in your foot have been injured. Symptoms may include:  Moderate or severe pain in the foot, ankle, or leg.  Bleeding at the site of injury.  Tingling, numbness, or loss of feeling (sensation) in part or all of your foot.  Loss of movement in part or all of your foot.  How is this diagnosed? Your health care provider will examine you and ask questions about how your injury happened. The exam may include checking for sensation and blood flow into your foot. You may also have tests, including X-rays and procedures to check the pressure in your foot. After initial treatment, additional  studies may be done to further diagnose the extent of your injuries. These may include:  A nerve conduction study to determine how well the nerves are working in your leg and foot.  MRI to determine if other injuries occurred that do not usually show up on X-ray.  How is this treated? Treatment for this condition depends on the severity of your crush injury. Treatment may include:  Thorough cleaning if you have an open wound. This may or may not require surgery.  Having a splint applied to your foot or leg.  Medicine to relieve pain.  Antibiotic medicine to prevent infection.  Stitches (sutures) to close open wounds.  One or more surgeries to address injuries to skin, bones, joints, tendons, ligaments, muscles, nerves, or blood vessels.  Follow these instructions at home: If you have a splint:  Wear the splint as told by your health care provider. Remove it only as told by your health care provider.  Loosen the splint if your toes tingle, become numb, or turn cold and blue.  Do not let your splint get wet if it is not waterproof.  Keep the splint clean. Wound care   If you have any skin wounds that were covered with bandages (dressings), follow instructions from your health care provider about how to take care of your wound. Make sure you: ? Wash your hands with soap and water before you change your dressing. If soap and water are not available, use  hand sanitizer. ? Change your dressing as told by your health care provider. ? Leave stitches (sutures), skin glue, or adhesive strips in place. These skin closures may need to stay in place for 2 weeks or longer. If adhesive strip edges start to loosen and curl up, you may trim the loose edges. Do not remove adhesive strips completely unless your health care provider tells you to do that.  If you have skin wounds, check them every day for signs of infection. Check for: ? More redness, swelling, or pain. ? More fluid or  blood. ? Warmth. ? Pus or a bad smell. Managing pain, stiffness, and swelling  If directed, put ice on the injured area. ? Put ice in a plastic bag. ? Place a towel between your skin and the bag. ? Leave the ice on for 20 minutes, 2-3 times a day.  Raise (elevate) the injured area above the level of your heart while you are sitting or lying down. Driving  Do not drive or operate heavy machinery while taking prescription pain medicine.  Ask your health care provider when it is safe to drive if you have a splint on your foot or leg. Activity  Return to your normal activities as told by your health care provider. Ask your health care provider what activities are safe for you.  Work with a physical therapist (PT) or occupational therapist (OT) as told by your health care provider. General instructions  Do not put pressure on any part of the splint until it is fully hardened. This may take several hours.  If you have a splint and it is not waterproof, cover it with a watertight plastic bag when you take a bath or a shower.  Take over-the-counter and prescription medicines only as told by your health care provider.  If you were prescribed an antibiotic, take it as told by your health care provider. Do not stop taking the antibiotic before the prescription is done.  Do not use any tobacco products, such as cigarettes, chewing tobacco, and e-cigarettes. Tobacco can delay bone healing. If you need help quitting, ask your health care provider.  Keep all follow-up visits as told by your health care provider. This is important. These include PT and OT visits. Contact a health care provider if:  A wound that was sutured opens up.  You have more redness, swelling, or pain in your foot.  You have more fluid or blood coming from your foot.  Your foot feels warm to the touch.  You have pus or a bad smell coming from your foot.  You have a fever. Get help right away if:  You suddenly  develop severe pain in your foot.  You previously had sensation in your foot and you suddenly lose sensation.  Your symptoms had improved and they suddenly get worse.  Your foot or toes are turning pink or blue. This information is not intended to replace advice given to you by your health care provider. Make sure you discuss any questions you have with your health care provider. Document Released: 06/03/2015 Document Revised: 11/28/2015 Document Reviewed: 02/13/2015 Elsevier Interactive Patient Education  [employer].

## 2018-01-26 NOTE — Assessment & Plan Note (Addendum)
Pt hit his foot on a pole outside while doing some gardening Monday was a little sore but not too bad, was still able to walk on it without issue.  Yesterday he dropped a bucket with some tools and things in it about 10-15 lbs directly on the medial side of the foot.  He is currently taking excedrin for the pain he has taken 6 so far since early this morning and put rubbing alcohol on it. My exam is reassuring.  The foot is edematous which is likely exaggerated by his heart failure and blood thinners.  It has the appearance of gout but is not exquisitely tender like I would expect.  Seems to be sore to deep palpation more consistent with his history.    -we will treat symptomatically for now with RICE therapy gave pt an ace bandage and some instructions on how to do RICE therapy -I will prescribe a few days of norco since his pain is significant but I expect this to improve as the swelling goes down -He was given return instructions

## 2018-01-27 NOTE — Progress Notes (Signed)
Medicine attending: Medical history, presenting problems, physical findings, and medications, reviewed with resident physician Dr William Winfrey on the day of the patient visit and I concur with his evaluation and management plan. 

## 2018-02-14 ENCOUNTER — Ambulatory Visit: Payer: Medicare Other

## 2018-02-17 ENCOUNTER — Other Ambulatory Visit: Payer: Self-pay

## 2018-02-17 ENCOUNTER — Encounter: Payer: Self-pay | Admitting: Internal Medicine

## 2018-02-17 ENCOUNTER — Ambulatory Visit (INDEPENDENT_AMBULATORY_CARE_PROVIDER_SITE_OTHER): Payer: Medicare Other | Admitting: Internal Medicine

## 2018-02-17 VITALS — BP 120/53 | HR 88 | Temp 99.2°F | Ht 68.0 in | Wt 177.6 lb

## 2018-02-17 DIAGNOSIS — F1911 Other psychoactive substance abuse, in remission: Secondary | ICD-10-CM

## 2018-02-17 DIAGNOSIS — Z952 Presence of prosthetic heart valve: Secondary | ICD-10-CM

## 2018-02-17 DIAGNOSIS — Z8679 Personal history of other diseases of the circulatory system: Secondary | ICD-10-CM | POA: Diagnosis not present

## 2018-02-17 DIAGNOSIS — X58XXXA Exposure to other specified factors, initial encounter: Secondary | ICD-10-CM

## 2018-02-17 DIAGNOSIS — S99922A Unspecified injury of left foot, initial encounter: Secondary | ICD-10-CM

## 2018-02-17 DIAGNOSIS — Z87891 Personal history of nicotine dependence: Secondary | ICD-10-CM

## 2018-02-17 MED ORDER — DICLOFENAC SODIUM 1 % TD GEL: 2.0000 g | Freq: Four times a day (QID) | TRANSDERMAL | 1 refills | Status: DC

## 2018-02-17 NOTE — Progress Notes (Signed)
   CC: toe pain  HPI:  [name] is a 72 y.o. male with PMHx as listed below who presents for f/u of foot injury.  Please see encounter tab for full details of HPI.  Past Medical History:  Diagnosis Date  . Atrial fibrillation (HCC)    post op.  s/p dc-cv 04/2008. previously on amiodarone  . Atrial flutter (HCC)    atypical  . AV block, 1st degree    .450 msec--progressive  . 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  a- Echo 07/2008 Ef 55% mild peroprosthetic MVR with High transmitral gradient (mean 14).  b- normal coronaries by cath 12/2007  . BPH (benign prostatic hyperplasia)   . CHF (congestive heart failure) (HCC)    EF 35-40 % 2011 due to valvular disease and diastolic dysfunction  . Chronic back pain   . CKD (chronic kidney disease), stage III (HCC)   . Dysphagia    with normal barium swallow 09/2008  . Endocarditis   . GERD (gastroesophageal reflux disease)   . Heavy alcohol use    history  . History of GI bleed   . Hypertension   . IV drug abuse (HCC)    history of  . Lower GI bleed 01/2016  . Pacemaker 2010    Physical Exam:  Vitals:   02/17/18 1419  BP: (!) 120/53  Pulse: 88  Temp: 99.2 F (37.3 C)  TempSrc: Oral  SpO2: 100%  Weight: 177 lb 9.6 oz (80.6 kg)  Height: 5\' 8"  (1.727 m)   Gen: Well appearing, NAD CV: RRR, no murmurs Pulm: Normal effort, CTA throughout, no wheezing Ext:  mild swelling and redness of medial aspect of left foot with tenderness to palpation over base of great toe. Improved from prior. Great toe has full ROM with minimal pain. DP pulse strong.  Skin:  No rashes      Assessment & Plan:   See Encounters Tab for problem based charting.  Patient seen with Dr. Heide Spark

## 2018-02-17 NOTE — Assessment & Plan Note (Addendum)
Foot injury 3 weeks ago. Endorses improvement in symptoms but continues to have tenderness. He continues to ride his bike 6 miles a day and play with his young grand kids at home. He has run out of norco and is requesting more. Reports he has been taking it q6h and also taking tylenol.  My exam is reassuring with minimal tenderness over base of great toe and decreased swelling and redness compared to prior picture. No history of gout or arthritis. Denies chest pain, sob, other joint pain.   He has a h/o IVDA and endocarditis requiring mechanical mitral valve replacement. Given his reassuring exam and h/o drug abuse, I do not think narcotics are indicated.   Plan: - voltaren gel  - f/u 3 weeks if not improving

## 2018-02-17 NOTE — Patient Instructions (Addendum)
It was nice seeing you today. Thank you for choosing [facility] for your Primary Care.   Today we talked about:  1) Foot/toe pain: I've sent a prescription for Voltaren gel to your pharmacy. You can apply this cream up to 4 times a day.   Also ice the foot for 20 minutes at a time 3-4 times a day and rest as much as you can.   FOLLOW-UP INSTRUCTIONS When: 3 weeks if foot pain not getting better  Please contact the clinic if you have any problems, or need to be seen sooner.

## 2018-02-21 NOTE — Progress Notes (Signed)
Internal Medicine Clinic Attending  I saw and evaluated the patient.  I personally confirmed the key portions of the history and exam documented by Dr. Vogel and I reviewed pertinent patient test results.  The assessment, diagnosis, and plan were formulated together and I agree with the documentation in the resident's note.  

## 2018-03-03 ENCOUNTER — Other Ambulatory Visit (HOSPITAL_COMMUNITY): Payer: Self-pay | Admitting: Internal Medicine

## 2018-03-17 ENCOUNTER — Ambulatory Visit (INDEPENDENT_AMBULATORY_CARE_PROVIDER_SITE_OTHER): Payer: Medicare Other | Admitting: Internal Medicine

## 2018-03-17 ENCOUNTER — Telehealth: Payer: Self-pay | Admitting: *Deleted

## 2018-03-17 ENCOUNTER — Other Ambulatory Visit: Payer: Self-pay

## 2018-03-17 ENCOUNTER — Other Ambulatory Visit: Payer: Self-pay | Admitting: Internal Medicine

## 2018-03-17 ENCOUNTER — Encounter: Payer: Self-pay | Admitting: Internal Medicine

## 2018-03-17 VITALS — BP 136/73 | HR 77 | Temp 98.2°F | Ht 68.0 in | Wt 181.8 lb

## 2018-03-17 DIAGNOSIS — R351 Nocturia: Secondary | ICD-10-CM

## 2018-03-17 DIAGNOSIS — Z23 Encounter for immunization: Secondary | ICD-10-CM

## 2018-03-17 DIAGNOSIS — Z79899 Other long term (current) drug therapy: Secondary | ICD-10-CM

## 2018-03-17 DIAGNOSIS — N182 Chronic kidney disease, stage 2 (mild): Secondary | ICD-10-CM | POA: Diagnosis not present

## 2018-03-17 DIAGNOSIS — N401 Enlarged prostate with lower urinary tract symptoms: Secondary | ICD-10-CM | POA: Diagnosis not present

## 2018-03-17 DIAGNOSIS — S99922A Unspecified injury of left foot, initial encounter: Secondary | ICD-10-CM

## 2018-03-17 DIAGNOSIS — I5022 Chronic systolic (congestive) heart failure: Secondary | ICD-10-CM

## 2018-03-17 DIAGNOSIS — F1911 Other psychoactive substance abuse, in remission: Secondary | ICD-10-CM | POA: Diagnosis not present

## 2018-03-17 DIAGNOSIS — M79672 Pain in left foot: Secondary | ICD-10-CM

## 2018-03-17 DIAGNOSIS — E876 Hypokalemia: Secondary | ICD-10-CM | POA: Diagnosis not present

## 2018-03-17 DIAGNOSIS — I130 Hypertensive heart and chronic kidney disease with heart failure and stage 1 through stage 4 chronic kidney disease, or unspecified chronic kidney disease: Secondary | ICD-10-CM

## 2018-03-17 DIAGNOSIS — Z952 Presence of prosthetic heart valve: Secondary | ICD-10-CM

## 2018-03-17 DIAGNOSIS — I4892 Unspecified atrial flutter: Secondary | ICD-10-CM

## 2018-03-17 MED ORDER — LIDOCAINE 5 % EX OINT: 1.0000 "application " | TOPICAL_OINTMENT | CUTANEOUS | 0 refills | Status: DC | PRN

## 2018-03-17 NOTE — Assessment & Plan Note (Signed)
CKD 2-3 over the past several years.  Last creatinine was 1.43 months ago.  He reports adherence with furosemide, lisinopril, spironolactone, Coreg.  His potassium supplementation was recently discontinued by his cardiologist.  We will recheck renal function today.

## 2018-03-17 NOTE — Telephone Encounter (Signed)
Left message on patient's VM requesting return call. Unsure if patient met up with family for ride home. No answer at home number of Emergency Contact. Mobile number is wrong number. Hubbard Hartshorn, RN, BSN

## 2018-03-17 NOTE — Assessment & Plan Note (Signed)
Stable.  Follows with heart failure clinic.  Denies shortness of breath, chest pain, orthopnea.  Usually rides his bike several miles a day but has not done so recently due to his foot pain.  His cardiologist recently discontinued his potassium supplementation.  On exam his lungs are clear, heart irregular rhythm, trace lower extremity edema.  Plan: -Recheck BMP today for potassium and kidney function -Continue Lasix, Coreg, lisinopril, spironolactone

## 2018-03-17 NOTE — Assessment & Plan Note (Signed)
Left foot pain has resolved and now presents with right foot pain after hitting it in the kitchen when getting up for a midnight snack. He was barefoot when it happened. Denies falling. It occurred 1.5 weeks ago and he endorses tenderness with ambulation and has noticed swelling. He reports improvement in pain with 1,000mg  tylenol 2-3 times a day and has started walking with a cane in order to keep weight off of his right leg.  He has also been soaking his foot in warm water and rubbing it with alcohol.  He denies weakness in the right leg or foot.  No calf tenderness, shortness of breath, or chest pain.  Physical exam is reassuring.  No signs of fracture. He feels that his pain is already improving.  He has a history of IV drug abuse and endocarditis requiring mechanical mitral valve replacement.  I would like to avoid narcotics.  Plan: - I encouraged him to soak his foot in ice water. - Continue Tylenol. -Prescribe lidocaine ointment. -Continue elevating foot as tolerated and walking with cane -Follow-up if pain continues

## 2018-03-17 NOTE — Assessment & Plan Note (Signed)
Denies worsening LUTS. States that he gets up once a night to urinate. Denies weak stream. PSA normal. Plan to continue finasteride. If symptoms worsen, can consider switching to tamsulosin with close follow up of blood pressure.

## 2018-03-17 NOTE — Patient Instructions (Addendum)
It was nice seeing you today. Thank you for choosing [facility] for your Primary Care.   Today we talked about:  1) foot pain: I sent some lidocaine gel to your pharmacy. Start icing the foot in ice water for 20 minutes several times a day. Continue taking tylenol as needed  2) I am checking your potassium today since your heart doctor stopped that medicine.   3) We gave you a flu shot   FOLLOW-UP INSTRUCTIONS When: 3 months or sooner if needed  Please contact the clinic if you have any problems, or need to be seen sooner.

## 2018-03-17 NOTE — Progress Notes (Signed)
   CC: foot pain   HPI:  [name] is a 72 y.o. with PMHx as listed below who presents for follow of foot pain. See encounter tab for full details of HPI.   Past Medical History:  Diagnosis Date  . Atrial fibrillation (HCC)    post op.  s/p dc-cv 04/2008. previously on amiodarone  . Atrial flutter (HCC)    atypical  . AV block, 1st degree    .450 msec--progressive  . 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  a- Echo 07/2008 Ef 55% mild peroprosthetic MVR with High transmitral gradient (mean 14).  b- normal coronaries by cath 12/2007  . BPH (benign prostatic hyperplasia)   . CHF (congestive heart failure) (HCC)    EF 35-40 % 2011 due to valvular disease and diastolic dysfunction  . Chronic back pain   . CKD (chronic kidney disease), stage III (HCC)   . Dysphagia    with normal barium swallow 09/2008  . Endocarditis   . GERD (gastroesophageal reflux disease)   . Heavy alcohol use    history  . History of GI bleed   . Hypertension   . IV drug abuse (HCC)    history of  . Lower GI bleed 01/2016  . Pacemaker 2010    Physical Exam:  Vitals:   03/17/18 1509  BP: 136/73  Pulse: 77  Temp: 98.2 F (36.8 C)  TempSrc: Oral  SpO2: 97%  Weight: 181 lb 12.8 oz (82.5 kg)  Height: 5\' 8"  (1.727 m)   Gen: Well appearing, NAD CV: irregular rhythm, regular rate, no murmurs Pulm: Normal effort, CTA throughout, no wheezing Ext: Warm, trace bilateral lower extremity edema MSK: no tenderness over left foot, Right foot with mild TTP over proximal aspect of great toe, no swelling or erythema, full passive and active ROM of right ankle and toes, DP pulse strong, no wounds on bottom of foot   Assessment & Plan:   See Encounters Tab for problem based charting.  Patient seen with Dr. Sandre Kitty

## 2018-03-18 ENCOUNTER — Telehealth: Payer: Self-pay | Admitting: *Deleted

## 2018-03-18 LAB — BMP8+ANION GAP
Anion Gap: 16 mmol/L (ref 10.0–18.0)
BUN/Creatinine Ratio: 15 (ref 10–24)
BUN: 21 mg/dL (ref 8–27)
CO2: 24 mmol/L (ref 20–29)
Calcium: 9.5 mg/dL (ref 8.6–10.2)
Chloride: 100 mmol/L (ref 96–106)
Creatinine, Ser: 1.4 mg/dL — ABNORMAL HIGH (ref 0.76–1.27)
GFR calc Af Amer: 57 mL/min/{1.73_m2} — ABNORMAL LOW (ref >59)
GFR calc non Af Amer: 49 mL/min/{1.73_m2} — ABNORMAL LOW (ref >59)
Glucose: 128 mg/dL — ABNORMAL HIGH (ref 65–99)
Potassium: 3.3 mmol/L — ABNORMAL LOW (ref 3.5–5.2)
Sodium: 140 mmol/L (ref 134–144)

## 2018-03-18 NOTE — Telephone Encounter (Signed)
French Ana with [facility] lab relayed lab result for potassium 3.3 taken 03/17/2018. Kinnie Feil, RN, BSN

## 2018-03-18 NOTE — Telephone Encounter (Signed)
I called the patient and instructed him to resume potassium supplementation

## 2018-03-18 NOTE — Progress Notes (Signed)
K is low. Called patient and instructed him to restart potassium supplementation

## 2018-03-21 ENCOUNTER — Encounter: Payer: Medicare Other | Admitting: Internal Medicine

## 2018-03-21 NOTE — Progress Notes (Signed)
Internal Medicine Clinic Attending  I saw and evaluated the patient.  I personally confirmed the key portions of the history and exam documented by Dr. Vogel and I reviewed pertinent patient test results.  The assessment, diagnosis, and plan were formulated together and I agree with the documentation in the resident's note.  Alexander Raines, M.D., Ph.D.  

## 2018-03-22 ENCOUNTER — Other Ambulatory Visit: Payer: Self-pay | Admitting: Internal Medicine

## 2018-03-22 ENCOUNTER — Telehealth: Payer: Self-pay | Admitting: Internal Medicine

## 2018-03-22 DIAGNOSIS — E876 Hypokalemia: Secondary | ICD-10-CM

## 2018-03-22 DIAGNOSIS — N182 Chronic kidney disease, stage 2 (mild): Secondary | ICD-10-CM

## 2018-03-22 MED ORDER — POTASSIUM CHLORIDE CRYS ER 20 MEQ PO TBCR: 20.0000 meq | EXTENDED_RELEASE_TABLET | Freq: Every day | ORAL | 1 refills | Status: DC

## 2018-03-22 NOTE — Telephone Encounter (Signed)
Sent new prescription to [facility]. Will need to come back and recheck potassium next week.

## 2018-03-22 NOTE — Telephone Encounter (Signed)
Left message on patient's self-identified VM that Rx for potassium has been sent to [facility]. Also requested return call to schedule lab appt for next week. Kinnie Feil, RN, BSN

## 2018-03-22 NOTE — Telephone Encounter (Signed)
Pt would like a call back as they no longer are taking the potassium pills and has no refills.

## 2018-03-23 ENCOUNTER — Encounter: Payer: Self-pay | Admitting: Internal Medicine

## 2018-03-23 ENCOUNTER — Ambulatory Visit (INDEPENDENT_AMBULATORY_CARE_PROVIDER_SITE_OTHER): Payer: Medicare Other | Admitting: Internal Medicine

## 2018-03-23 VITALS — BP 124/76 | HR 70 | Ht 68.0 in | Wt 179.8 lb

## 2018-03-23 DIAGNOSIS — Z950 Presence of cardiac pacemaker: Secondary | ICD-10-CM | POA: Diagnosis not present

## 2018-03-23 DIAGNOSIS — Z952 Presence of prosthetic heart valve: Secondary | ICD-10-CM

## 2018-03-23 DIAGNOSIS — I481 Persistent atrial fibrillation: Secondary | ICD-10-CM | POA: Diagnosis not present

## 2018-03-23 DIAGNOSIS — I4819 Other persistent atrial fibrillation: Secondary | ICD-10-CM

## 2018-03-23 DIAGNOSIS — I5022 Chronic systolic (congestive) heart failure: Secondary | ICD-10-CM

## 2018-03-23 NOTE — Patient Instructions (Addendum)
Medication Instructions:  Your physician has recommended you make the following change in your medication:   1. Increase your lasix to 40mg , 2 tablets daily, for the next three days. Then begin your normal dose of 20mg , 1 tablet, once daily.  Labwork: None ordered.  Testing/Procedures: None ordered.  Follow-Up: Your physician wants you to follow-up in: One Year with Dr Graciela Husbands. You will receive a reminder letter in the mail two months in advance. If you don't receive a letter, please call our office to schedule the follow-up appointment.  Remote monitoring is used to monitor your Pacemaker of ICD from home. This monitoring reduces the number of office visits required to check your device to one time per year. It allows [place] to keep an eye on the functioning of your device to ensure it is working properly. You are scheduled for a device check from home on 06/22/2018. You may send your transmission at any time that day. If you have a wireless device, the transmission will be sent automatically. After your physician reviews your transmission, you will receive a postcard with your next transmission date.    Any Other Special Instructions Will Be Listed Below (If Applicable).     If you need a refill on your cardiac medications before your next appointment, please call your pharmacy.

## 2018-03-23 NOTE — Progress Notes (Signed)
Patient Care Team: Ali Lowe, MD as PCP - General   HPI  [name] is a 72 y.o. male Seen in followup for CRT-P  Implanted September 2012 for profound first degree AV block in the setting of a modest valvular cardiomyopathy ejection fraction 35-40%.   Device at [facility] with Gen change 6/19  He is s/p mechanical MVR for endocarditis associated with IVDA.   DATE TEST EF   6/14 Echo   50 %   6/19 Echo   35-40 %         .  Date Cr K Hgb  1/18   11.9  6/19 1.28 3.9    9/19  1.4 3.3 13.0   Complaining of orthopnea and nocturnal dyspnea with some dyspnea on exertion.  He has some peripheral edema.  Denies chest pain.  No fevers.   Past Medical History:  Diagnosis Date  . Atrial fibrillation (HCC)    post op.  s/p dc-cv 04/2008. previously on amiodarone  . Atrial flutter (HCC)    atypical  . AV block, 1st degree    .450 msec--progressive  . 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  a- Echo 07/2008 Ef 55% mild peroprosthetic MVR with High transmitral gradient (mean 14).  b- normal coronaries by cath 12/2007  . BPH (benign prostatic hyperplasia)   . CHF (congestive heart failure) (HCC)    EF 35-40 % 2011 due to valvular disease and diastolic dysfunction  . Chronic back pain   . CKD (chronic kidney disease), stage III (HCC)   . Dysphagia    with normal barium swallow 09/2008  . Endocarditis   . GERD (gastroesophageal reflux disease)   . Heavy alcohol use    history  . History of GI bleed   . Hypertension   . IV drug abuse (HCC)    history of  . Lower GI bleed 01/2016  . Pacemaker 2010    Past Surgical History:  Procedure Laterality Date  . BIV PACEMAKER GENERATOR CHANGEOUT N/A 12/13/2017   Procedure: BIV PACEMAKER GENERATOR CHANGEOUT;  Surgeon: Duke Salvia, MD;  Location: [facility] INVASIVE CV LAB;  Service: Cardiovascular;  Laterality: N/A;  . CARDIAC VALVE REPLACEMENT     mitral valve, st jude mechanical   .  COLONOSCOPY N/A 03/26/2014   Procedure: COLONOSCOPY;  Surgeon: Rachael Fee, MD;  Location: [facility] ENDOSCOPY;  Service: Endoscopy;  Laterality: N/A;  . ENTEROSCOPY N/A 03/28/2014   Procedure: ENTEROSCOPY;  Surgeon: Rachael Fee, MD;  Location: [facility] ENDOSCOPY;  Service: Endoscopy;  Laterality: N/A;  . ESOPHAGOGASTRODUODENOSCOPY N/A 03/25/2014   Procedure: ESOPHAGOGASTRODUODENOSCOPY (EGD);  Surgeon: Iva Boop, MD;  Location: [facility] ENDOSCOPY;  Service: Endoscopy;  Laterality: N/A;  . GIVENS CAPSULE STUDY N/A 03/26/2014   Procedure: GIVENS CAPSULE STUDY;  Surgeon: Rachael Fee, MD;  Location: [facility] ENDOSCOPY;  Service: Endoscopy;  Laterality: N/A;  . PACEMAKER INSERTION     inserted about 4-5 years ago    Current Outpatient Medications  Medication Sig Dispense Refill  . aspirin EC 81 MG tablet Take 1 tablet (81 mg total) by mouth daily. 90 tablet 3  . carvedilol (COREG) 6.25 MG tablet TAKE 1 TABLET (6.25 MG TOTAL) BY MOUTH 2 (TWO) TIMES DAILY WITH A MEAL. 60 tablet 3  . diclofenac sodium (VOLTAREN) 1 % GEL Apply 2 g topically 4 (four) times daily. 1 Tube 1  . finasteride (PROSCAR) 5 MG tablet Take  1 tablet (5 mg total) by mouth daily. 30 tablet 5  . furosemide (LASIX) 20 MG tablet TAKE 1 TABLET (20 MG TOTAL) BY MOUTH DAILY. 90 tablet 3  . lidocaine (XYLOCAINE) 5 % ointment Apply 1 application topically as needed. 35.44 g 0  . lisinopril (PRINIVIL,ZESTRIL) 2.5 MG tablet TAKE 1 TABLET TWICE A DAY 180 tablet 3  . potassium chloride SA (KLOR-CON M20) 20 MEQ tablet Take 1 tablet (20 mEq total) by mouth daily. 30 tablet 1  . spironolactone (ALDACTONE) 25 MG tablet TAKE 1 TABLET (25 MG TOTAL) BY MOUTH DAILY. NEEDS OFFICE VISIT [phone] 30 tablet 3  . warfarin (COUMADIN) 5 MG tablet 1 TABLET BY MOUTH DAILY AT 6PM ALL DAYS OF WEEK EXCEPT MON, WED, FRI ONLY TAKE 1/2 TABLET 90 tablet 0   No current facility-administered medications for this visit.     No Known Allergies  Review of Systems negative  except from HPI and PMH  Physical Exam BP 124/76   Pulse 70   Ht 5\' 8"  (1.727 m)   Wt 179 lb 12.8 oz (81.6 kg)   SpO2 96%   BMI 27.34 kg/m  Well developed and nourished in no acute distress HENT normal Neck supple with JVP-7-8 cm positive HJR  Bibasilar crackles Regular rate and rhythm, 2/6 systolic murmur with mechanical S2  no Clubbing cyanosis edema Skin-warm and dry A & Oriented  Grossly normal sensory and motor function     ECG  @ 70  Paced -/14/50- QRS lead I and lead V1 Assessment and  Plan  Nonischemic cardiomyopathy  Congestive heart failure acute/chronic systolic  Fatigue  Mitral valve replacement-Saint Jude  Atrial fibrillation-permanent  Renal insufficiency grade 3  Pacemaker-CRT-St. Jude generator replaced 6/19  Low potassium repletion already ordered by primary care  16 % VS  And with narrow QRS underlying will not change AV blocking meds  With his orthopnea, we will increase his diuretics from 20--40 x 3 days and then resume 20.  He is advised to take an extra 20 mg i.e. 40 in the morning for recurrent symptoms of orthopnea and weight gain (greater than 5 pounds in 3 days) and or edema  He should take extra potassium when he takes his diuretic.  On Anticoagulation;  No bleeding issues   We spent more than 50% of our >25 min visit in face to face counseling regarding the above

## 2018-03-25 NOTE — Telephone Encounter (Signed)
Done

## 2018-03-25 NOTE — Telephone Encounter (Signed)
Spoke with patient. States he restarted potassium 2 days ago. Lab appt made for one week (03/30/2018). Kinnie Feil, RN, BSN

## 2018-03-28 ENCOUNTER — Inpatient Hospital Stay (HOSPITAL_COMMUNITY): Admission: RE | Admit: 2018-03-28 | Payer: Medicare Other | Source: Ambulatory Visit | Admitting: Internal Medicine

## 2018-03-28 ENCOUNTER — Ambulatory Visit (HOSPITAL_COMMUNITY)
Admission: RE | Admit: 2018-03-28 | Discharge: 2018-03-28 | Disposition: A | Discharge status: Home / Self Care | Payer: Medicare Other | Source: Ambulatory Visit | Attending: Internal Medicine | Admitting: Internal Medicine

## 2018-03-28 VITALS — BP 112/67 | HR 70 | Wt 176.0 lb

## 2018-03-28 DIAGNOSIS — Z79899 Other long term (current) drug therapy: Secondary | ICD-10-CM | POA: Insufficient documentation

## 2018-03-28 DIAGNOSIS — I429 Cardiomyopathy, unspecified: Secondary | ICD-10-CM | POA: Diagnosis present

## 2018-03-28 DIAGNOSIS — N183 Chronic kidney disease, stage 3 (moderate): Secondary | ICD-10-CM | POA: Diagnosis not present

## 2018-03-28 DIAGNOSIS — G8929 Other chronic pain: Secondary | ICD-10-CM | POA: Insufficient documentation

## 2018-03-28 DIAGNOSIS — I4892 Unspecified atrial flutter: Secondary | ICD-10-CM | POA: Insufficient documentation

## 2018-03-28 DIAGNOSIS — Z7982 Long term (current) use of aspirin: Secondary | ICD-10-CM | POA: Insufficient documentation

## 2018-03-28 DIAGNOSIS — F1910 Other psychoactive substance abuse, uncomplicated: Secondary | ICD-10-CM | POA: Diagnosis not present

## 2018-03-28 DIAGNOSIS — I5022 Chronic systolic (congestive) heart failure: Secondary | ICD-10-CM

## 2018-03-28 DIAGNOSIS — I480 Paroxysmal atrial fibrillation: Secondary | ICD-10-CM | POA: Diagnosis not present

## 2018-03-28 DIAGNOSIS — K219 Gastro-esophageal reflux disease without esophagitis: Secondary | ICD-10-CM | POA: Diagnosis not present

## 2018-03-28 DIAGNOSIS — N400 Enlarged prostate without lower urinary tract symptoms: Secondary | ICD-10-CM | POA: Insufficient documentation

## 2018-03-28 DIAGNOSIS — Z7901 Long term (current) use of anticoagulants: Secondary | ICD-10-CM | POA: Diagnosis not present

## 2018-03-28 DIAGNOSIS — I130 Hypertensive heart and chronic kidney disease with heart failure and stage 1 through stage 4 chronic kidney disease, or unspecified chronic kidney disease: Secondary | ICD-10-CM | POA: Insufficient documentation

## 2018-03-28 DIAGNOSIS — Z952 Presence of prosthetic heart valve: Secondary | ICD-10-CM | POA: Diagnosis not present

## 2018-03-28 MED ORDER — LISINOPRIL 5 MG PO TABS: 5.0000 mg | ORAL_TABLET | Freq: Two times a day (BID) | ORAL | 3 refills | Status: DC

## 2018-03-28 NOTE — Progress Notes (Addendum)
Patient ID: BRENNDAN CASHDOLLAR, male   DOB: 1946-03-29, 72 y.o.   MRN: [id number]     Advanced Heart Failure Clinic Note    Primary Cardiologist: Dr. Gala Romney Internal Medicine: Dr Earnest Conroy EP: Dr Graciela Husbands   HPI: [name] is a 72 year old male with h/o polysubstance abuse c/b MV endocarditis s/p St. Jude MVR, CHF due to NICM and PAF.  Underwent CRT-P implant for bradycardia and fatigue.   He presents today for follow up. Says he is doing ok. Had gen change in 6/19. Still riding his bicycle for about an hour around town every other day. No CP or SOB. No edema, orthopnea or PND. Taking medications without any problems. Taking coumadin without bleeding. IMTS managing.   ICD interrogated personally. No VT  Volume looks good.   Echo 12/10/17 EF 35-40% with mechanical MVR    Labs (1/15): K 3.9, creatinine 1.56, hemoglobin 11 Labs (01/14/2015) K 3.2 Creatinine 1.55 BNP 412 Labs (01/30/15): k 3.8 Creatinine 1.47  Labs (8/16): K 4, creatinine 1.55, digoxin 0.6 Labs (9/16) K 3.4, creatinine 1.48 Labs (09/24/2015) K 3.7 Creatinine 1.24   TTE in 2010 showed normal EF with elevated gradient across MV (mean 14) with perivalvular leak. Follow-up TEE in June 2010 with EF 40-45% mild stenosis mean gradient = 8. No vegetation or abscess.   Echo 8/12 EF 35% mild perivalvular MR.  Echo 3/13 EF 50% mild MVR stable with mild peri-valvular leak.  ECHO 12/16/2012 EF 50% stable MVR ECHO 12/2014 - EF 25-30%  Grade I DD, mechanical mitral valve with mean gradient 6 mmHg, no significant MR.  ECHO 09/2015- EF 35-40%. Grade I DD Mitral Valve ok no stenosis  SH: Lives with his wife. No ETOH, tobacco abuse or drugs.  FH: Mom deceased: no cardiac issues        Father deceased: no cardiac issues.   ROS: All systems negative except as listed in HPI, PMH and Problem List.  Past Medical History:  Diagnosis Date  . Atrial fibrillation (HCC)    post op.  s/p dc-cv 04/2008. previously on amiodarone  . Atrial flutter (HCC)     atypical  . AV block, 1st degree    .450 msec--progressive  . 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  a- Echo 07/2008 Ef 55% mild peroprosthetic MVR with High transmitral gradient (mean 14).  b- normal coronaries by cath 12/2007  . BPH (benign prostatic hyperplasia)   . CHF (congestive heart failure) (HCC)    EF 35-40 % 2011 due to valvular disease and diastolic dysfunction  . Chronic back pain   . CKD (chronic kidney disease), stage III (HCC)   . Dysphagia    with normal barium swallow 09/2008  . Endocarditis   . GERD (gastroesophageal reflux disease)   . Heavy alcohol use    history  . History of GI bleed   . Hypertension   . IV drug abuse (HCC)    history of  . Lower GI bleed 01/2016  . Pacemaker 2010    Current Outpatient Medications  Medication Sig Dispense Refill  . aspirin EC 81 MG tablet Take 1 tablet (81 mg total) by mouth daily. 90 tablet 3  . carvedilol (COREG) 6.25 MG tablet TAKE 1 TABLET (6.25 MG TOTAL) BY MOUTH 2 (TWO) TIMES DAILY WITH A MEAL. 60 tablet 3  . diclofenac sodium (VOLTAREN) 1 % GEL Apply 2 g topically 4 (four) times daily. 1 Tube 1  . finasteride (  PROSCAR) 5 MG tablet Take 1 tablet (5 mg total) by mouth daily. 30 tablet 5  . furosemide (LASIX) 20 MG tablet TAKE 1 TABLET (20 MG TOTAL) BY MOUTH DAILY. 90 tablet 3  . lidocaine (XYLOCAINE) 5 % ointment Apply 1 application topically as needed. 35.44 g 0  . lisinopril (PRINIVIL,ZESTRIL) 2.5 MG tablet TAKE 1 TABLET TWICE A DAY 180 tablet 3  . potassium chloride SA (KLOR-CON M20) 20 MEQ tablet Take 1 tablet (20 mEq total) by mouth daily. 30 tablet 1  . spironolactone (ALDACTONE) 25 MG tablet TAKE 1 TABLET (25 MG TOTAL) BY MOUTH DAILY. NEEDS OFFICE VISIT [phone] 30 tablet 3  . warfarin (COUMADIN) 5 MG tablet 1 TABLET BY MOUTH DAILY AT 6PM ALL DAYS OF WEEK EXCEPT MON, WED, FRI ONLY TAKE 1/2 TABLET 90 tablet 0   No current facility-administered medications for this  encounter.     Vitals:   03/28/18 1158  BP: 112/67  Pulse: 70  SpO2: 100%  Weight: 79.8 kg (176 lb)   Wt Readings from Last 3 Encounters:  03/28/18 79.8 kg (176 lb)  03/23/18 81.6 kg (179 lb 12.8 oz)  03/17/18 82.5 kg (181 lb 12.8 oz)     PHYSICAL EXAM: General:  Well appearing. No resp difficulty HEENT: normal Neck: supple. no JVD. Carotids 2+ bilat; no bruits. No lymphadenopathy or thryomegaly appreciated. Cor: PMI nondisplaced. Regular rate & rhythm. Mechanical s1.  No rubs, gallops or murmurs. Lungs: clear Abdomen: soft, nontender, nondistended. No hepatosplenomegaly. No bruits or masses. Good bowel sounds. Extremities: no cyanosis, clubbing, rash, edema Neuro: alert & orientedx3, cranial nerves grossly intact. moves all 4 extremities w/o difficulty. Affect pleasant    ASSESSMENT & PLAN: 1. Chronic systolic HF: Nonischemic cardiomyopathy s/p St Jude  CRT-D.  March 2017 EF improved 35-40%.  NYHA class II symptoms. - Doing well. NYHA II  - Volume status looks good. Continue lasix 20mg  daily  - Continue Coreg 6.25 mg bid.  - Increase lisinopril to 5 bid. If BP tolerates can consider Entresto down the road. Previously BP has been too low to tolerate . - Continue 25 mg spiro daily 2. Mechanical mitral valve:  - .On coumadin + aspirin 81 mg daily.   Goal INR 2.5-3.5.   INR followed with IMTS - MVR stable on echo 6/19 - Reminded about need for SBE prophylaxis with procedures  3. GI Bleed: 03/2014,  - no BRBPR/melena. Continue PPI.  4. HTN - Well controlled  5. PAF -  In NSR today. Followed by Dr.Klein  - continue coumadin  6. Memory Impairment - Follows with PCP.  - Seems to be doing well today    [name] MD 03/28/2018

## 2018-03-28 NOTE — Addendum Note (Signed)
Encounter addended by: Georgina Peer, RN on: 03/28/2018 12:25 PM  Actions taken: Order list changed, Sign clinical note

## 2018-03-28 NOTE — Addendum Note (Signed)
Encounter addended by: [name], MD on: 03/28/2018 12:24 PM  Actions taken: Sign clinical note, Charge Capture section accepted

## 2018-03-28 NOTE — Patient Instructions (Signed)
INCREASE Lisinopril to 5 mg Twice Daily  Follow up in 9 Months, please call our clinic in April/May of 2020 to schedule your appointment. [phone], Option 3.

## 2018-03-30 ENCOUNTER — Other Ambulatory Visit (INDEPENDENT_AMBULATORY_CARE_PROVIDER_SITE_OTHER): Payer: Medicare Other

## 2018-03-30 DIAGNOSIS — N182 Chronic kidney disease, stage 2 (mild): Secondary | ICD-10-CM

## 2018-03-31 LAB — BMP8+ANION GAP
ANION GAP: 16 mmol/L (ref 10.0–18.0)
BUN/Creatinine Ratio: 16 (ref 10–24)
BUN: 22 mg/dL (ref 8–27)
CO2: 21 mmol/L (ref 20–29)
CREATININE: 1.41 mg/dL — AB (ref 0.76–1.27)
Calcium: 9.5 mg/dL (ref 8.6–10.2)
Chloride: 102 mmol/L (ref 96–106)
GFR calc Af Amer: 56 mL/min/{1.73_m2} — ABNORMAL LOW (ref >59)
GFR, EST NON AFRICAN AMERICAN: 49 mL/min/{1.73_m2} — AB (ref >59)
Glucose: 115 mg/dL — ABNORMAL HIGH (ref 65–99)
Potassium: 3.8 mmol/L (ref 3.5–5.2)
Sodium: 139 mmol/L (ref 134–144)

## 2018-04-04 ENCOUNTER — Ambulatory Visit (INDEPENDENT_AMBULATORY_CARE_PROVIDER_SITE_OTHER): Payer: Medicare Other | Admitting: Pharmacist

## 2018-04-04 DIAGNOSIS — Z952 Presence of prosthetic heart valve: Secondary | ICD-10-CM | POA: Diagnosis not present

## 2018-04-04 DIAGNOSIS — Z7901 Long term (current) use of anticoagulants: Secondary | ICD-10-CM | POA: Diagnosis not present

## 2018-04-04 DIAGNOSIS — Z5181 Encounter for therapeutic drug level monitoring: Secondary | ICD-10-CM

## 2018-04-04 LAB — POCT INR: INR: 4.1 — AB (ref 2.0–3.0)

## 2018-04-04 NOTE — Patient Instructions (Signed)
Patient instructed to take medications as defined in the Anti-coagulation Track section of this encounter.  Patient instructed to take today's dose.  Patient instructed to take take 1/2 tablet (2.5 mg) of the 5mg  tablets once daily EXCEPT on Thursday and Sunday, take 1 tablet (5 mg). Patient verbalized understanding of these instructions.

## 2018-04-04 NOTE — Progress Notes (Signed)
Anticoagulation Management [name] is a 72 y.o. male who reports to the clinic for monitoring of warfarin treatment.    Indication: History of mitral valve replacement and long-term (current) use of anticoagulants, and Atrial Flutter (resolved)  Duration: indefinite Supervising physician: Debe Coder  Anticoagulation Clinic Visit History: Patient does not report signs/symptoms of bleeding or thromboembolism. Other recent changes: No diet, medications, lifestyle changes endorsed by patient other than what was documented in patient findings. Anticoagulation Episode Summary    Current INR goal:   2.5-3.5  TTR:   64.5 % (5.6 y)  Next INR check:   04/11/2018  INR from last check:   4.1! (04/04/2018)  Weekly max warfarin dose:     Target end date:   Indefinite  INR check location:   Anticoagulation Clinic  Preferred lab:     Send INR reminders to:   [facility] [address]   Indications   IRREGULAR PULSE [I49.9] Long term (current) use of anticoagulants (Resolved) [Z79.01] ATRIAL FLUTTER (Resolved) [I48.92] H/O mitral valve replacement with mechanical valve [Z95.2]       Comments:         Anticoagulation Care Providers    Provider Role Specialty Phone number   [name], MD  Cardiology [phone]      No Known Allergies Prior to Admission medications   Medication Sig Start Date End Date Taking? Authorizing Provider  aspirin EC 81 MG tablet Take 1 tablet (81 mg total) by mouth daily. 12/28/17  Yes Duke Salvia, MD  carvedilol (COREG) 6.25 MG tablet TAKE 1 TABLET (6.25 MG TOTAL) BY MOUTH 2 (TWO) TIMES DAILY WITH A MEAL. 12/21/17  Yes [name], DO  diclofenac sodium (VOLTAREN) 1 % GEL Apply 2 g topically 4 (four) times daily. 02/17/18  Yes Ali Lowe, MD  finasteride (PROSCAR) 5 MG tablet Take 1 tablet (5 mg total) by mouth daily. 07/27/17  Yes [name], DO  furosemide (LASIX) 20 MG tablet TAKE 1 TABLET (20 MG TOTAL) BY MOUTH DAILY. 03/03/18   Yes Bensimhon, Bevelyn Buckles, MD  lidocaine (XYLOCAINE) 5 % ointment Apply 1 application topically as needed. 03/17/18  Yes Ali Lowe, MD  lisinopril (PRINIVIL,ZESTRIL) 5 MG tablet Take 1 tablet (5 mg total) by mouth 2 (two) times daily. 03/28/18  Yes Bensimhon, Bevelyn Buckles, MD  potassium chloride SA (KLOR-CON M20) 20 MEQ tablet Take 1 tablet (20 mEq total) by mouth daily. 03/22/18  Yes Anne Shutter, MD  spironolactone (ALDACTONE) 25 MG tablet TAKE 1 TABLET (25 MG TOTAL) BY MOUTH DAILY. NEEDS OFFICE VISIT [phone] 01/17/18  Yes Bensimhon, Bevelyn Buckles, MD  warfarin (COUMADIN) 5 MG tablet 1 TABLET BY MOUTH DAILY AT 6PM ALL DAYS OF WEEK EXCEPT MON, WED, FRI ONLY TAKE 1/2 TABLET 03/21/18  Yes Earl Lagos, MD   Past Medical History:  Diagnosis Date  . Atrial fibrillation (HCC)    post op.  s/p dc-cv 04/2008. previously on amiodarone  . Atrial flutter (HCC)    atypical  . AV block, 1st degree    .450 msec--progressive  . 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  a- Echo 07/2008 Ef 55% mild peroprosthetic MVR with High transmitral gradient (mean 14).  b- normal coronaries by cath 12/2007  . BPH (benign prostatic hyperplasia)   . CHF (congestive heart failure) (HCC)    EF 35-40 % 2011 due to valvular disease and diastolic dysfunction  . Chronic back pain   .  CKD (chronic kidney disease), stage III (HCC)   . Dysphagia    with normal barium swallow 09/2008  . Endocarditis   . GERD (gastroesophageal reflux disease)   . Heavy alcohol use    history  . History of GI bleed   . Hypertension   . IV drug abuse (HCC)    history of  . Lower GI bleed 01/2016  . Pacemaker 2010   Social History   Socioeconomic History  . Marital status: Divorced    Spouse name: Not on file  . Number of children: 9  . Years of education: Not on file  . Highest education level: Not on file  Occupational History  . Occupation: retired  Engineer, production  . Financial resource  strain: Not on file  . Food insecurity:    Worry: Not on file    Inability: Not on file  . Transportation needs:    Medical: Not on file    Non-medical: Not on file  Tobacco Use  . Smoking status: Former Smoker    Years: 0.00    Types: Cigarettes  . Smokeless tobacco: Never Used  Substance and Sexual Activity  . Alcohol use: No    Alcohol/week: 0.0 standard drinks  . Drug use: No  . Sexual activity: Not on file  Lifestyle  . Physical activity:    Days per week: Not on file    Minutes per session: Not on file  . Stress: Not on file  Relationships  . Social connections:    Talks on phone: Not on file    Gets together: Not on file    Attends religious service: Not on file    Active member of club or organization: Not on file    Attends meetings of clubs or organizations: Not on file    Relationship status: Not on file  Other Topics Concern  . Not on file  Social History Narrative   Exercise everyday riding a bike for 1 hour   Family History  Problem Relation Age of Onset  . Coronary artery disease Mother     ASSESSMENT Recent Results: The most recent result is correlated with 27.5 mg per week: Lab Results  Component Value Date   INR 4.1 (A) 04/04/2018   INR 3.5 (A) 01/17/2018   INR 3.0 12/27/2017    Anticoagulation Dosing: Description   Take 1/2 tablet (2.5 mg) of the 5mg  tablets once daily EXCEPT on Thursday and Sunday, take 1 tablet (5 mg).     INR today: Supratherapeutic  PLAN Weekly dose was decreased by 18.2% to 22.5 mg per week  Patient Instructions  Patient instructed to take medications as defined in the Anti-coagulation Track section of this encounter.  Patient instructed to take today's dose.  Patient instructed to take take 1/2 tablet (2.5 mg) of the 5mg  tablets once daily EXCEPT on Thursday and Sunday, take 1 tablet (5 mg). Patient verbalized understanding of these instructions.     Patient advised to contact clinic or seek medical  attention if signs/symptoms of bleeding or thromboembolism occur.  Patient verbalized understanding by repeating back information and was advised to contact me if further medication-related questions arise. Patient was also provided an information handout.  Follow-up Return in 1 week (on 04/11/2018) for Follow up INR at 1000.  Thank you for allowing pharmacy to be a part of this patient's care.  Lenord Carbo, PharmD PGY1 Pharmacy Resident Phone: [phone]  15 minutes spent face-to-face with the patient during  the encounter. 50% of time spent on education. 50% of time was spent on point of care INR testing, results determination, dose adjustment, patient counseling, and documentation in [email or web address]

## 2018-04-06 NOTE — Progress Notes (Signed)
I reviewed the anticoagulation note.  Patient is on [facility] for MVR and INR was elevated to 4.1 and dose of coumadin was decreased.

## 2018-04-11 ENCOUNTER — Ambulatory Visit: Payer: Medicare Other

## 2018-04-14 ENCOUNTER — Other Ambulatory Visit: Payer: Self-pay | Admitting: Internal Medicine

## 2018-04-14 DIAGNOSIS — E876 Hypokalemia: Secondary | ICD-10-CM

## 2018-04-18 ENCOUNTER — Ambulatory Visit (INDEPENDENT_AMBULATORY_CARE_PROVIDER_SITE_OTHER): Payer: Medicare Other

## 2018-04-18 ENCOUNTER — Other Ambulatory Visit: Payer: Self-pay | Admitting: Internal Medicine

## 2018-04-18 DIAGNOSIS — I499 Cardiac arrhythmia, unspecified: Secondary | ICD-10-CM

## 2018-04-18 DIAGNOSIS — Z952 Presence of prosthetic heart valve: Secondary | ICD-10-CM | POA: Diagnosis not present

## 2018-04-18 DIAGNOSIS — Z7901 Long term (current) use of anticoagulants: Secondary | ICD-10-CM

## 2018-04-18 DIAGNOSIS — I5022 Chronic systolic (congestive) heart failure: Secondary | ICD-10-CM

## 2018-04-18 DIAGNOSIS — Z5181 Encounter for therapeutic drug level monitoring: Secondary | ICD-10-CM

## 2018-04-18 DIAGNOSIS — Z8679 Personal history of other diseases of the circulatory system: Secondary | ICD-10-CM

## 2018-04-18 LAB — POCT INR: INR: 2.5 (ref 2.0–3.0)

## 2018-04-18 NOTE — Progress Notes (Signed)
Anticoagulation Management [name] is a 72 y.o. male who reports to the clinic for monitoring of warfarin treatment.    Indication: Atrial flutter, long term use of anticoagulants  Duration: indefinite Supervising physician: Debe Coder  Anticoagulation Clinic Visit History: Patient does not report signs/symptoms of bleeding or thromboembolism  Other recent changes: No diet, medications, lifestyle changes reported by patient Anticoagulation Episode Summary    Current INR goal:   2.5-3.5  TTR:   64.5 % (5.6 y)  Next INR check:   05/02/2018  INR from last check:   2.5 (04/18/2018)  Weekly max warfarin dose:     Target end date:   Indefinite  INR check location:   Anticoagulation Clinic  Preferred lab:     Send INR reminders to:   [facility] [address]   Indications   IRREGULAR PULSE [I49.9] Long term (current) use of anticoagulants (Resolved) [Z79.01] ATRIAL FLUTTER (Resolved) [I48.92] H/O mitral valve replacement with mechanical valve [Z95.2]       Comments:         Anticoagulation Care Providers    Provider Role Specialty Phone number   [name], MD  Cardiology [phone]      No Known Allergies Prior to Admission medications   Medication Sig Start Date End Date Taking? Authorizing Provider  aspirin EC 81 MG tablet Take 1 tablet (81 mg total) by mouth daily. 12/28/17  Yes Duke Salvia, MD  carvedilol (COREG) 6.25 MG tablet TAKE 1 TABLET (6.25 MG TOTAL) BY MOUTH 2 (TWO) TIMES DAILY WITH A MEAL. 12/21/17  Yes [name], DO  diclofenac sodium (VOLTAREN) 1 % GEL Apply 2 g topically 4 (four) times daily. 02/17/18  Yes Ali Lowe, MD  finasteride (PROSCAR) 5 MG tablet Take 1 tablet (5 mg total) by mouth daily. 07/27/17  Yes [name], DO  furosemide (LASIX) 20 MG tablet TAKE 1 TABLET (20 MG TOTAL) BY MOUTH DAILY. 03/03/18  Yes Bensimhon, Bevelyn Buckles, MD  KLOR-CON M20 20 MEQ tablet TAKE 1 TABLET BY MOUTH EVERY DAY 04/14/18  Yes Ali Lowe, MD  lidocaine (XYLOCAINE) 5 % ointment Apply 1 application topically as needed. 03/17/18  Yes Ali Lowe, MD  lisinopril (PRINIVIL,ZESTRIL) 5 MG tablet Take 1 tablet (5 mg total) by mouth 2 (two) times daily. 03/28/18  Yes Bensimhon, Bevelyn Buckles, MD  spironolactone (ALDACTONE) 25 MG tablet TAKE 1 TABLET (25 MG TOTAL) BY MOUTH DAILY. NEEDS OFFICE VISIT [phone] 01/17/18  Yes Bensimhon, Bevelyn Buckles, MD  warfarin (COUMADIN) 5 MG tablet 1 TABLET BY MOUTH DAILY AT 6PM ALL DAYS OF WEEK EXCEPT MON, WED, FRI ONLY TAKE 1/2 TABLET 03/21/18  Yes Earl Lagos, MD   Past Medical History:  Diagnosis Date  . Atrial fibrillation (HCC)    post op.  s/p dc-cv 04/2008. previously on amiodarone  . Atrial flutter (HCC)    atypical  . AV block, 1st degree    .450 msec--progressive  . 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  a- Echo 07/2008 Ef 55% mild peroprosthetic MVR with High transmitral gradient (mean 14).  b- normal coronaries by cath 12/2007  . BPH (benign prostatic hyperplasia)   . CHF (congestive heart failure) (HCC)    EF 35-40 % 2011 due to valvular disease and diastolic dysfunction  . Chronic back pain   . CKD (chronic kidney disease), stage III (HCC)   . Dysphagia    with normal barium swallow  09/2008  . Endocarditis   . GERD (gastroesophageal reflux disease)   . Heavy alcohol use    history  . History of GI bleed   . Hypertension   . IV drug abuse (HCC)    history of  . Lower GI bleed 01/2016  . Pacemaker 2010   Social History   Socioeconomic History  . Marital status: Divorced    Spouse name: Not on file  . Number of children: 9  . Years of education: Not on file  . Highest education level: Not on file  Occupational History  . Occupation: retired  Engineer, production  . Financial resource strain: Not on file  . Food insecurity:    Worry: Not on file    Inability: Not on file  . Transportation needs:    Medical: Not on file     Non-medical: Not on file  Tobacco Use  . Smoking status: Former Smoker    Years: 0.00    Types: Cigarettes  . Smokeless tobacco: Never Used  Substance and Sexual Activity  . Alcohol use: No    Alcohol/week: 0.0 standard drinks  . Drug use: No  . Sexual activity: Not on file  Lifestyle  . Physical activity:    Days per week: Not on file    Minutes per session: Not on file  . Stress: Not on file  Relationships  . Social connections:    Talks on phone: Not on file    Gets together: Not on file    Attends religious service: Not on file    Active member of club or organization: Not on file    Attends meetings of clubs or organizations: Not on file    Relationship status: Not on file  Other Topics Concern  . Not on file  Social History Narrative   Exercise everyday riding a bike for 1 hour   Family History  Problem Relation Age of Onset  . Coronary artery disease Mother     ASSESSMENT Recent Results: The most recent result is correlated with 22.5 mg per week: Lab Results  Component Value Date   INR 2.5 04/18/2018   INR 4.1 (A) 04/04/2018   INR 3.5 (A) 01/17/2018    Anticoagulation Dosing: Description   Take 1/2 tablet (2.5 mg) of the 5mg  tablets once daily EXCEPT on Sunday, Monday, and Thursday, take 1 tablet (5 mg).     INR today: Therapeutic at the lower end of goal range  PLAN Weekly dose was increased by 9% to 25 mg per week  Patient Instructions  Patient instructed to take medications as defined in the Anti-coagulation Track section of this encounter.  Patient instructed to take today's dose.  Patient instructed to take 1/2 tablet (2.5 mg) of the 5mg  tablets once daily EXCEPT on Sunday, Monday, and Thursday, take 1 tablet (5 mg). Patient verbalized understanding of these instructions.     Patient advised to contact clinic or seek medical attention if signs/symptoms of bleeding or thromboembolism occur.  Patient verbalized understanding by repeating back  information and was advised to contact me if further medication-related questions arise. Patient was also provided an information handout.  Follow-up Return in about 2 weeks (around 05/02/2018) for f/u INR at 1030.  Lenward Chancellor, PharmD PGY1 Pharmacy Resident Phone [phone] 04/18/2018 11:46 AM   15 minutes spent face-to-face with the patient during the encounter. 50% of time spent on education. 50% of time was spent on point of care INR sample collection, result processing,  and documentation.

## 2018-04-18 NOTE — Patient Instructions (Signed)
Patient instructed to take medications as defined in the Anti-coagulation Track section of this encounter.  Patient instructed to take today's dose.  Patient instructed to take 1/2 tablet (2.5 mg) of the 5mg  tablets once daily EXCEPT on Sunday, Monday, and Thursday, take 1 tablet (5 mg). Patient verbalized understanding of these instructions.

## 2018-04-26 NOTE — Progress Notes (Signed)
I reviewed the anticoagulation note.  Patient is on [facility] for A flutter and valve replacement.  INR was 2.5 and dose was increased to help keep in range of 2.5 - 3.5.

## 2018-04-29 LAB — CUP PACEART INCLINIC DEVICE CHECK
Brady Statistic RA Percent Paced: 0 %
Date Time Interrogation Session: 20190918130927
Implantable Lead Implant Date: 20120905
Implantable Lead Location: 753859
Implantable Pulse Generator Implant Date: 20190610
Lead Channel Impedance Value: 400 Ohm
Lead Channel Impedance Value: 625 Ohm
Lead Channel Pacing Threshold Amplitude: 1.25 V
Lead Channel Pacing Threshold Pulse Width: 0.5 ms
Lead Channel Pacing Threshold Pulse Width: 0.6 ms
Lead Channel Setting Pacing Amplitude: 2 V
Lead Channel Setting Pacing Pulse Width: 0.6 ms
Lead Channel Setting Sensing Sensitivity: 2 mV
MDC IDC LEAD IMPLANT DT: 20120905
MDC IDC LEAD IMPLANT DT: 20120905
MDC IDC LEAD LOCATION: 753858
MDC IDC LEAD LOCATION: 753860
MDC IDC MSMT BATTERY REMAINING LONGEVITY: 90 mo
MDC IDC MSMT BATTERY VOLTAGE: 3.02 V
MDC IDC MSMT LEADCHNL RV PACING THRESHOLD AMPLITUDE: 0.875 V
MDC IDC MSMT LEADCHNL RV SENSING INTR AMPL: 12 mV
MDC IDC SET LEADCHNL LV PACING AMPLITUDE: 2.25 V
MDC IDC SET LEADCHNL RV PACING PULSEWIDTH: 0.5 ms
Pulse Gen Model: 3222
Pulse Gen Serial Number: 9022287

## 2018-05-02 ENCOUNTER — Ambulatory Visit (INDEPENDENT_AMBULATORY_CARE_PROVIDER_SITE_OTHER): Payer: Medicare Other | Admitting: Pharmacist

## 2018-05-02 DIAGNOSIS — Z8679 Personal history of other diseases of the circulatory system: Secondary | ICD-10-CM | POA: Diagnosis not present

## 2018-05-02 DIAGNOSIS — Z7901 Long term (current) use of anticoagulants: Secondary | ICD-10-CM | POA: Diagnosis not present

## 2018-05-02 DIAGNOSIS — Z5181 Encounter for therapeutic drug level monitoring: Secondary | ICD-10-CM | POA: Diagnosis not present

## 2018-05-02 DIAGNOSIS — Z952 Presence of prosthetic heart valve: Secondary | ICD-10-CM

## 2018-05-02 LAB — POCT INR: INR: 2.3 (ref 2.0–3.0)

## 2018-05-02 NOTE — Progress Notes (Signed)
Anticoagulation Management [name] is a 72 y.o. male who reports to the clinic for monitoring of warfarin treatment.    Indication: Irregular pulse; atrial flutter; H/O mitral valve replacement with MPV; Long term current use of anticoagulant.    Duration: indefinite Supervising physician: Blanch Media  Anticoagulation Clinic Visit History: Patient does not report signs/symptoms of bleeding or thromboembolism  Other recent changes: No diet, medications, lifestyle changes endorsed by patient at this visit.  Anticoagulation Episode Summary    Current INR goal:   2.5-3.5  TTR:   64.1 % (5.6 y)  Next INR check:   05/02/2018  INR from last check:   2.3! (05/02/2018)  Weekly max warfarin dose:     Target end date:   Indefinite  INR check location:   Anticoagulation Clinic  Preferred lab:     Send INR reminders to:   [facility] [address]   Indications   IRREGULAR PULSE [I49.9] Long term (current) use of anticoagulants (Resolved) [Z79.01] ATRIAL FLUTTER (Resolved) [I48.92] H/O mitral valve replacement with mechanical valve [Z95.2]       Comments:         Anticoagulation Care Providers    Provider Role Specialty Phone number   [name], MD  Cardiology [phone]      No Known Allergies Prior to Admission medications   Medication Sig Start Date End Date Taking? Authorizing Provider  aspirin EC 81 MG tablet Take 1 tablet (81 mg total) by mouth daily. 12/28/17  Yes Duke Salvia, MD  carvedilol (COREG) 6.25 MG tablet TAKE 1 TABLET (6.25 MG TOTAL) BY MOUTH 2 (TWO) TIMES DAILY WITH A MEAL. 04/18/18  Yes Ali Lowe, MD  diclofenac sodium (VOLTAREN) 1 % GEL Apply 2 g topically 4 (four) times daily. 02/17/18  Yes Ali Lowe, MD  finasteride (PROSCAR) 5 MG tablet Take 1 tablet (5 mg total) by mouth daily. 07/27/17  Yes [name], DO  furosemide (LASIX) 20 MG tablet TAKE 1 TABLET (20 MG TOTAL) BY MOUTH DAILY. 03/03/18  Yes Bensimhon, Bevelyn Buckles,  MD  KLOR-CON M20 20 MEQ tablet TAKE 1 TABLET BY MOUTH EVERY DAY 04/14/18  Yes Ali Lowe, MD  lidocaine (XYLOCAINE) 5 % ointment Apply 1 application topically as needed. 03/17/18  Yes Ali Lowe, MD  lisinopril (PRINIVIL,ZESTRIL) 5 MG tablet Take 1 tablet (5 mg total) by mouth 2 (two) times daily. 03/28/18  Yes Bensimhon, Bevelyn Buckles, MD  spironolactone (ALDACTONE) 25 MG tablet TAKE 1 TABLET (25 MG TOTAL) BY MOUTH DAILY. NEEDS OFFICE VISIT [phone] 01/17/18  Yes Bensimhon, Bevelyn Buckles, MD  warfarin (COUMADIN) 5 MG tablet 1 TABLET BY MOUTH DAILY AT 6PM ALL DAYS OF WEEK EXCEPT MON, WED, FRI ONLY TAKE 1/2 TABLET 03/21/18  Yes Earl Lagos, MD   Past Medical History:  Diagnosis Date  . Atrial fibrillation (HCC)    post op.  s/p dc-cv 04/2008. previously on amiodarone  . Atrial flutter (HCC)    atypical  . AV block, 1st degree    .450 msec--progressive  . 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  a- Echo 07/2008 Ef 55% mild peroprosthetic MVR with High transmitral gradient (mean 14).  b- normal coronaries by cath 12/2007  . BPH (benign prostatic hyperplasia)   . CHF (congestive heart failure) (HCC)    EF 35-40 % 2011 due to valvular disease and diastolic dysfunction  . Chronic back pain   . CKD (chronic  kidney disease), stage III (HCC)   . Dysphagia    with normal barium swallow 09/2008  . Endocarditis   . GERD (gastroesophageal reflux disease)   . Heavy alcohol use    history  . History of GI bleed   . Hypertension   . IV drug abuse (HCC)    history of  . Lower GI bleed 01/2016  . Pacemaker 2010   Social History   Socioeconomic History  . Marital status: Divorced    Spouse name: Not on file  . Number of children: 9  . Years of education: Not on file  . Highest education level: Not on file  Occupational History  . Occupation: retired  Engineer, production  . Financial resource strain: Not on file  . Food insecurity:    Worry: Not on file     Inability: Not on file  . Transportation needs:    Medical: Not on file    Non-medical: Not on file  Tobacco Use  . Smoking status: Former Smoker    Years: 0.00    Types: Cigarettes  . Smokeless tobacco: Never Used  Substance and Sexual Activity  . Alcohol use: No    Alcohol/week: 0.0 standard drinks  . Drug use: No  . Sexual activity: Not on file  Lifestyle  . Physical activity:    Days per week: Not on file    Minutes per session: Not on file  . Stress: Not on file  Relationships  . Social connections:    Talks on phone: Not on file    Gets together: Not on file    Attends religious service: Not on file    Active member of club or organization: Not on file    Attends meetings of clubs or organizations: Not on file    Relationship status: Not on file  Other Topics Concern  . Not on file  Social History Narrative   Exercise everyday riding a bike for 1 hour   Family History  Problem Relation Age of Onset  . Coronary artery disease Mother     ASSESSMENT Recent Results: The most recent result is correlated with 25 mg per week: Lab Results  Component Value Date   INR 2.3 05/02/2018   INR 2.5 04/18/2018   INR 4.1 (A) 04/04/2018    Anticoagulation Dosing: Description   Take one (1) tablet on Mondays, Wednesdays and Fridays. All other days, take only one-half (1/2) tablet of your 5mg  peach-colored warfarin tablets.      INR today: Therapeutic  PLAN Weekly dose was unchanged.  Patient Instructions  Patient instructed to take medications as defined in the Anti-coagulation Track section of this encounter.  Patient instructed to take today's dose.  Patient instructed to take one (1) tablet on Mondays, Wednesdays and Fridays. All other days, take only one-half (1/2) tablet of your 5mg  peach-colored warfarin tablets.  Patient verbalized understanding of these instructions.     Patient advised to contact clinic or seek medical attention if signs/symptoms of bleeding  or thromboembolism occur.  Patient verbalized understanding by repeating back information and was advised to contact me if further medication-related questions arise. Patient was also provided an information handout.  Follow-up Return in 4 weeks (on 05/30/2018) for Follow up INR at 1015h.  Elicia Lamp, PharmD, CACP, CPP  15 minutes spent face-to-face with the patient during the encounter. 50% of time spent on education. 50% of time was spent on fingerstick point of care INR sample collection, processing, results determination  and documentation in [email or web address].

## 2018-05-02 NOTE — Patient Instructions (Signed)
Patient instructed to take medications as defined in the Anti-coagulation Track section of this encounter.  Patient instructed to take today's dose.  Patient instructed to take  one (1) tablet on Mondays, Wednesdays and Fridays. All other days, take only one-half (1/2) tablet of your 5mg peach-colored warfarin tablets.  Patient verbalized understanding of these instructions.    

## 2018-05-03 ENCOUNTER — Other Ambulatory Visit: Payer: Self-pay | Admitting: Internal Medicine

## 2018-05-12 ENCOUNTER — Other Ambulatory Visit (HOSPITAL_COMMUNITY): Payer: Self-pay | Admitting: Internal Medicine

## 2018-05-30 ENCOUNTER — Ambulatory Visit (INDEPENDENT_AMBULATORY_CARE_PROVIDER_SITE_OTHER): Payer: Medicare Other | Admitting: Pharmacist

## 2018-05-30 DIAGNOSIS — Z8679 Personal history of other diseases of the circulatory system: Secondary | ICD-10-CM

## 2018-05-30 DIAGNOSIS — Z5181 Encounter for therapeutic drug level monitoring: Secondary | ICD-10-CM | POA: Diagnosis not present

## 2018-05-30 DIAGNOSIS — Z7901 Long term (current) use of anticoagulants: Secondary | ICD-10-CM

## 2018-05-30 DIAGNOSIS — Z952 Presence of prosthetic heart valve: Secondary | ICD-10-CM

## 2018-05-30 LAB — POCT INR: INR: 2.7 (ref 2.0–3.0)

## 2018-05-30 NOTE — Progress Notes (Signed)
Anticoagulation Management [name] is a 72 y.o. male who reports to the clinic for monitoring of warfarin treatment.    Indication: History of mitral valve replacment, irregular pulse, atrial flutter and long term current use of anticoagulant.   Duration: indefinite Supervising physician: Jessy Oto MD  Anticoagulation Clinic Visit History: Patient does not report signs/symptoms of bleeding or thromboembolism  Other recent changes: No diet, medications, lifestyle changes.  Anticoagulation Episode Summary    Current INR goal:   2.5-3.5  TTR:   63.9 % (5.7 y)  Next INR check:   06/20/2018  INR from last check:   2.7 (05/30/2018)  Weekly max warfarin dose:     Target end date:   Indefinite  INR check location:   Anticoagulation Clinic  Preferred lab:     Send INR reminders to:   [facility] [address]   Indications   IRREGULAR PULSE [I49.9] Long term (current) use of anticoagulants (Resolved) [Z79.01] ATRIAL FLUTTER (Resolved) [I48.92] H/O mitral valve replacement with mechanical valve [Z95.2]       Comments:         Anticoagulation Care Providers    Provider Role Specialty Phone number   [name], MD  Cardiology [phone]      No Known Allergies Prior to Admission medications   Medication Sig Start Date End Date Taking? Authorizing Provider  aspirin EC 81 MG tablet Take 1 tablet (81 mg total) by mouth daily. 12/28/17  Yes Duke Salvia, MD  carvedilol (COREG) 6.25 MG tablet TAKE 1 TABLET (6.25 MG TOTAL) BY MOUTH 2 (TWO) TIMES DAILY WITH A MEAL. 04/18/18  Yes Ali Lowe, MD  diclofenac sodium (VOLTAREN) 1 % GEL Apply 2 g topically 4 (four) times daily. 02/17/18  Yes Ali Lowe, MD  finasteride (PROSCAR) 5 MG tablet Take 1 tablet (5 mg total) by mouth daily. 07/27/17  Yes [name], DO  furosemide (LASIX) 20 MG tablet TAKE 1 TABLET (20 MG TOTAL) BY MOUTH DAILY. 03/03/18  Yes Bensimhon, Bevelyn Buckles, MD  KLOR-CON M20 20 MEQ tablet  TAKE 1 TABLET BY MOUTH EVERY DAY 04/14/18  Yes Ali Lowe, MD  lidocaine (XYLOCAINE) 5 % ointment Apply 1 application topically as needed. 03/17/18  Yes Ali Lowe, MD  lisinopril (PRINIVIL,ZESTRIL) 5 MG tablet Take 1 tablet (5 mg total) by mouth 2 (two) times daily. 03/28/18  Yes Bensimhon, Bevelyn Buckles, MD  spironolactone (ALDACTONE) 25 MG tablet TAKE 1 TABLET (25 MG TOTAL) BY MOUTH DAILY. NEEDS OFFICE VISIT [phone] 05/12/18  Yes Bensimhon, Bevelyn Buckles, MD  warfarin (COUMADIN) 5 MG tablet 1 TABLET BY MOUTH DAILY AT 6PM ALL DAYS OF WEEK EXCEPT MON, WED, FRI ONLY TAKE 1/2 TABLET 03/21/18  Yes Earl Lagos, MD   Past Medical History:  Diagnosis Date  . Atrial fibrillation (HCC)    post op.  s/p dc-cv 04/2008. previously on amiodarone  . Atrial flutter (HCC)    atypical  . AV block, 1st degree    .450 msec--progressive  . 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  a- Echo 07/2008 Ef 55% mild peroprosthetic MVR with High transmitral gradient (mean 14).  b- normal coronaries by cath 12/2007  . BPH (benign prostatic hyperplasia)   . CHF (congestive heart failure) (HCC)    EF 35-40 % 2011 due to valvular disease and diastolic dysfunction  . Chronic back pain   . CKD (chronic kidney disease), stage III (HCC)   .  Dysphagia    with normal barium swallow 09/2008  . Endocarditis   . GERD (gastroesophageal reflux disease)   . Heavy alcohol use    history  . History of GI bleed   . Hypertension   . IV drug abuse (HCC)    history of  . Lower GI bleed 01/2016  . Pacemaker 2010   Social History   Socioeconomic History  . Marital status: Divorced    Spouse name: Not on file  . Number of children: 9  . Years of education: Not on file  . Highest education level: Not on file  Occupational History  . Occupation: retired  Engineer, production  . Financial resource strain: Not on file  . Food insecurity:    Worry: Not on file    Inability: Not on file  .  Transportation needs:    Medical: Not on file    Non-medical: Not on file  Tobacco Use  . Smoking status: Former Smoker    Years: 0.00    Types: Cigarettes  . Smokeless tobacco: Never Used  Substance and Sexual Activity  . Alcohol use: No    Alcohol/week: 0.0 standard drinks  . Drug use: No  . Sexual activity: Not on file  Lifestyle  . Physical activity:    Days per week: Not on file    Minutes per session: Not on file  . Stress: Not on file  Relationships  . Social connections:    Talks on phone: Not on file    Gets together: Not on file    Attends religious service: Not on file    Active member of club or organization: Not on file    Attends meetings of clubs or organizations: Not on file    Relationship status: Not on file  Other Topics Concern  . Not on file  Social History Narrative   Exercise everyday riding a bike for 1 hour   Family History  Problem Relation Age of Onset  . Coronary artery disease Mother     ASSESSMENT Recent Results: The most recent result is correlated with 25mg  per week: Lab Results  Component Value Date   INR 2.7 05/30/2018   INR 2.3 05/02/2018   INR 2.5 04/18/2018    Anticoagulation Dosing: Description   Take one (1) tablet on Mondays, Wednesdays and Fridays. All other days, take only one-half (1/2) tablet of your 5mg  peach-colored warfarin tablets.      INR today: Therapeutic  PLAN Weekly dose was unchanged.   Patient Instructions  Patient instructed to take medications as defined in the Anti-coagulation Track section of this encounter.  Patient instructed to take today's dose.  Patient instructed to take  one (1) tablet on Mondays, Wednesdays and Fridays. All other days, take only one-half (1/2) tablet of your 5mg  peach-colored warfarin tablets.  Patient verbalized understanding of these instructions.     Patient advised to contact clinic or seek medical attention if signs/symptoms of bleeding or thromboembolism  occur.  Patient verbalized understanding by repeating back information and was advised to contact me if further medication-related questions arise. Patient was also provided an information handout.  Follow-up Return in 3 weeks (on 06/20/2018) for Follow up INR at 1030h.  Elicia Lamp, PharmD, CACP, CPP  15 minutes spent face-to-face with the patient during the encounter. 50% of time spent on education. 50% of time was spent on fingerstick point of care INR sample collection, processing results determination and documentation in [email or web address].

## 2018-05-30 NOTE — Progress Notes (Signed)
INTERNAL MEDICINE TEACHING ATTENDING ADDENDUM  I agree with pharmacy recommendations as outlined in their note.   Alexander N Raines, MD  

## 2018-05-30 NOTE — Patient Instructions (Signed)
Patient instructed to take medications as defined in the Anti-coagulation Track section of this encounter.  Patient instructed to take today's dose.  Patient instructed to take  one (1) tablet on Mondays, Wednesdays and Fridays. All other days, take only one-half (1/2) tablet of your 5mg  peach-colored warfarin tablets.  Patient verbalized understanding of these instructions.

## 2018-06-20 ENCOUNTER — Ambulatory Visit: Payer: Medicare Other

## 2018-06-22 ENCOUNTER — Telehealth: Payer: Self-pay

## 2018-06-22 NOTE — Telephone Encounter (Signed)
LMOVM reminding pt to send remote transmission.   

## 2018-06-23 ENCOUNTER — Encounter: Payer: Self-pay | Admitting: Cardiology

## 2018-06-27 ENCOUNTER — Encounter: Payer: Self-pay | Admitting: Pharmacist

## 2018-06-27 ENCOUNTER — Ambulatory Visit (INDEPENDENT_AMBULATORY_CARE_PROVIDER_SITE_OTHER): Payer: Medicare Other | Admitting: Pharmacist

## 2018-06-27 DIAGNOSIS — Z5181 Encounter for therapeutic drug level monitoring: Secondary | ICD-10-CM | POA: Diagnosis not present

## 2018-06-27 DIAGNOSIS — Z8679 Personal history of other diseases of the circulatory system: Secondary | ICD-10-CM | POA: Diagnosis not present

## 2018-06-27 DIAGNOSIS — Z7901 Long term (current) use of anticoagulants: Secondary | ICD-10-CM

## 2018-06-27 DIAGNOSIS — Z952 Presence of prosthetic heart valve: Secondary | ICD-10-CM | POA: Diagnosis not present

## 2018-06-27 LAB — POCT INR: INR: 2.1 (ref 2.0–3.0)

## 2018-06-27 NOTE — Progress Notes (Signed)
INTERNAL MEDICINE TEACHING ATTENDING ADDENDUM ° °I agree with pharmacy recommendations as outlined in their note.  ° °-Duncan Vincent MD ° °

## 2018-06-27 NOTE — Progress Notes (Signed)
Anticoagulation Management [name] is a 72 y.o. male who reports to the clinic for monitoring of warfarin treatment.    Indication: H/O mitral valve replacement, Irregular pulse, Atrial Flutter (resolved), Long term current use of anticoagulant.    Duration: indefinite Supervising physician: Erlinda Hong  Anticoagulation Clinic Visit History: Patient does not report signs/symptoms of bleeding or thromboembolism  Other recent changes: NO diet, medications, lifestyle changes endorsed.  Anticoagulation Episode Summary    Current INR goal:   2.5-3.5  TTR:   63.5 % (5.8 y)  Next INR check:   06/20/2018  INR from last check:   2.1! (06/27/2018)  Weekly max warfarin dose:     Target end date:   Indefinite  INR check location:   Anticoagulation Clinic  Preferred lab:     Send INR reminders to:   [facility] [address]   Indications   IRREGULAR PULSE [I49.9] Long term (current) use of anticoagulants (Resolved) [Z79.01] ATRIAL FLUTTER (Resolved) [I48.92] H/O mitral valve replacement with mechanical valve [Z95.2]       Comments:         Anticoagulation Care Providers    Provider Role Specialty Phone number   [name], MD  Cardiology [phone]      No Known Allergies Prior to Admission medications   Medication Sig Start Date End Date Taking? Authorizing Provider  aspirin EC 81 MG tablet Take 1 tablet (81 mg total) by mouth daily. 12/28/17  Yes Duke Salvia, MD  carvedilol (COREG) 6.25 MG tablet TAKE 1 TABLET (6.25 MG TOTAL) BY MOUTH 2 (TWO) TIMES DAILY WITH A MEAL. 04/18/18  Yes Ali Lowe, MD  diclofenac sodium (VOLTAREN) 1 % GEL Apply 2 g topically 4 (four) times daily. 02/17/18  Yes Ali Lowe, MD  finasteride (PROSCAR) 5 MG tablet Take 1 tablet (5 mg total) by mouth daily. 07/27/17  Yes [name], DO  furosemide (LASIX) 20 MG tablet TAKE 1 TABLET (20 MG TOTAL) BY MOUTH DAILY. 03/03/18  Yes Bensimhon, Bevelyn Buckles, MD  KLOR-CON M20 20 MEQ  tablet TAKE 1 TABLET BY MOUTH EVERY DAY 04/14/18  Yes Ali Lowe, MD  lidocaine (XYLOCAINE) 5 % ointment Apply 1 application topically as needed. 03/17/18  Yes Ali Lowe, MD  lisinopril (PRINIVIL,ZESTRIL) 5 MG tablet Take 1 tablet (5 mg total) by mouth 2 (two) times daily. 03/28/18  Yes Bensimhon, Bevelyn Buckles, MD  spironolactone (ALDACTONE) 25 MG tablet TAKE 1 TABLET (25 MG TOTAL) BY MOUTH DAILY. NEEDS OFFICE VISIT [phone] 05/12/18  Yes Bensimhon, Bevelyn Buckles, MD  warfarin (COUMADIN) 5 MG tablet 1 TABLET BY MOUTH DAILY AT 6PM ALL DAYS OF WEEK EXCEPT MON, WED, FRI ONLY TAKE 1/2 TABLET 03/21/18  Yes Earl Lagos, MD   Past Medical History:  Diagnosis Date  . Atrial fibrillation (HCC)    post op.  s/p dc-cv 04/2008. previously on amiodarone  . Atrial flutter (HCC)    atypical  . AV block, 1st degree    .450 msec--progressive  . 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  a- Echo 07/2008 Ef 55% mild peroprosthetic MVR with High transmitral gradient (mean 14).  b- normal coronaries by cath 12/2007  . BPH (benign prostatic hyperplasia)   . CHF (congestive heart failure) (HCC)    EF 35-40 % 2011 due to valvular disease and diastolic dysfunction  . Chronic back pain   . CKD (chronic kidney disease), stage III (HCC)   .  Dysphagia    with normal barium swallow 09/2008  . Endocarditis   . GERD (gastroesophageal reflux disease)   . Heavy alcohol use    history  . History of GI bleed   . Hypertension   . IV drug abuse (HCC)    history of  . Lower GI bleed 01/2016  . Pacemaker 2010   Social History   Socioeconomic History  . Marital status: Divorced    Spouse name: Not on file  . Number of children: 9  . Years of education: Not on file  . Highest education level: Not on file  Occupational History  . Occupation: retired  Engineer, production  . Financial resource strain: Not on file  . Food insecurity:    Worry: Not on file    Inability: Not on file   . Transportation needs:    Medical: Not on file    Non-medical: Not on file  Tobacco Use  . Smoking status: Former Smoker    Years: 0.00    Types: Cigarettes  . Smokeless tobacco: Never Used  Substance and Sexual Activity  . Alcohol use: No    Alcohol/week: 0.0 standard drinks  . Drug use: No  . Sexual activity: Not on file  Lifestyle  . Physical activity:    Days per week: Not on file    Minutes per session: Not on file  . Stress: Not on file  Relationships  . Social connections:    Talks on phone: Not on file    Gets together: Not on file    Attends religious service: Not on file    Active member of club or organization: Not on file    Attends meetings of clubs or organizations: Not on file    Relationship status: Not on file  Other Topics Concern  . Not on file  Social History Narrative   Exercise everyday riding a bike for 1 hour   Family History  Problem Relation Age of Onset  . Coronary artery disease Mother     ASSESSMENT Recent Results: The most recent result is correlated with 25 mg per week: Lab Results  Component Value Date   INR 2.1 06/27/2018   INR 2.7 05/30/2018   INR 2.3 05/02/2018    Anticoagulation Dosing: Description   Take one (1) tablet on Mondays, Tuesdays, Wednesdays, Thursdays and Fridays. All other days, take only one-half (1/2) tablet of your 5mg  peach-colored warfarin tablets.      INR today: Subtherapeutic  PLAN Weekly dose was increased by 20% to 30 mg per week  Patient Instructions  Patient instructed to take medications as defined in the Anti-coagulation Track section of this encounter.  Patient instructed to take today's dose.  Patient instructed to take one (1) tablet on Mondays, Tuesdays, Wednesdays, Thursdays and Fridays. All other days, take only one-half (1/2) tablet of your 5mg  peach-colored warfarin tablets.  Patient verbalized understanding of these instructions.     Patient advised to contact clinic or seek  medical attention if signs/symptoms of bleeding or thromboembolism occur.  Patient verbalized understanding by repeating back information and was advised to contact me if further medication-related questions arise. Patient was also provided an information handout.  Follow-up Return in 3 weeks (on 07/18/2018) for Follow up INR at 1000h.  Elicia Lamp, PharmD, CPP  15 minutes spent face-to-face with the patient during the encounter. 50% of time spent on education. 50% of time was spent on fingerstick point of care INR sample collection, processing, results  determination, dose adjustment and documentation in [email or web address] .

## 2018-06-27 NOTE — Patient Instructions (Signed)
Patient instructed to take medications as defined in the Anti-coagulation Track section of this encounter.  Patient instructed to take today's dose.  Patient instructed to take one (1) tablet on Mondays, Tuesdays, Wednesdays, Thursdays and Fridays. All other days, take only one-half (1/2) tablet of your 5mg  peach-colored warfarin tablets.  Patient verbalized understanding of these instructions.

## 2018-07-15 ENCOUNTER — Other Ambulatory Visit: Payer: Self-pay | Admitting: Pharmacist

## 2018-07-15 DIAGNOSIS — Z952 Presence of prosthetic heart valve: Secondary | ICD-10-CM

## 2018-07-18 ENCOUNTER — Ambulatory Visit: Payer: Medicare Other

## 2018-07-25 ENCOUNTER — Other Ambulatory Visit: Payer: Self-pay | Admitting: Internal Medicine

## 2018-07-25 DIAGNOSIS — I4892 Unspecified atrial flutter: Secondary | ICD-10-CM

## 2018-07-26 ENCOUNTER — Other Ambulatory Visit: Payer: Self-pay | Admitting: Internal Medicine

## 2018-07-26 DIAGNOSIS — N401 Enlarged prostate with lower urinary tract symptoms: Secondary | ICD-10-CM

## 2018-07-27 ENCOUNTER — Other Ambulatory Visit: Payer: Self-pay | Admitting: Internal Medicine

## 2018-07-27 DIAGNOSIS — N401 Enlarged prostate with lower urinary tract symptoms: Secondary | ICD-10-CM

## 2018-07-27 NOTE — Telephone Encounter (Signed)
I tried e-prescribing it again but the transmission failed so I just called the pharmacy and prescribed it over the phone

## 2018-08-01 ENCOUNTER — Encounter: Payer: Self-pay | Admitting: Pharmacist

## 2018-08-01 ENCOUNTER — Ambulatory Visit (INDEPENDENT_AMBULATORY_CARE_PROVIDER_SITE_OTHER): Payer: Medicare Other | Admitting: Pharmacist

## 2018-08-01 DIAGNOSIS — Z952 Presence of prosthetic heart valve: Secondary | ICD-10-CM

## 2018-08-01 DIAGNOSIS — Z5181 Encounter for therapeutic drug level monitoring: Secondary | ICD-10-CM | POA: Diagnosis not present

## 2018-08-01 DIAGNOSIS — Z7901 Long term (current) use of anticoagulants: Secondary | ICD-10-CM | POA: Diagnosis not present

## 2018-08-01 DIAGNOSIS — I4892 Unspecified atrial flutter: Secondary | ICD-10-CM

## 2018-08-01 LAB — POCT INR: INR: 3 (ref 2.0–3.0)

## 2018-08-01 NOTE — Progress Notes (Signed)
Anticoagulation Management [name] is a 73 y.o. male who reports to the clinic for monitoring of warfarin treatment.    Indication: Irregular pulse, long term (current) use of anticoagulant, Atrial Flutter (Resolved), H/O Mitral valve replacement.    Duration: indefinite Supervising physician: Earl Lagos  Anticoagulation Clinic Visit History: Patient does not report signs/symptoms of bleeding or thromboembolism  Other recent changes: No diet, medications, lifestyle changes endorsed.  Anticoagulation Episode Summary    Current INR goal:   2.5-3.5  TTR:   63.4 % (5.9 y)  Next INR check:   08/29/2018  INR from last check:   3.0 (08/01/2018)  Weekly max warfarin dose:     Target end date:   Indefinite  INR check location:   Anticoagulation Clinic  Preferred lab:     Send INR reminders to:   [facility] [address]   Indications   IRREGULAR PULSE [I49.9] Long term (current) use of anticoagulants (Resolved) [Z79.01] ATRIAL FLUTTER (Resolved) [I48.92] H/O mitral valve replacement with mechanical valve [Z95.2]       Comments:         Anticoagulation Care Providers    Provider Role Specialty Phone number   [name], MD  Cardiology [phone]      No Known Allergies Prior to Admission medications   Medication Sig Start Date End Date Taking? Authorizing Provider  aspirin EC 81 MG tablet Take 1 tablet (81 mg total) by mouth daily. 12/28/17  Yes Duke Salvia, MD  carvedilol (COREG) 6.25 MG tablet TAKE 1 TABLET (6.25 MG TOTAL) BY MOUTH 2 (TWO) TIMES DAILY WITH A MEAL. 04/18/18  Yes Ali Lowe, MD  diclofenac sodium (VOLTAREN) 1 % GEL Apply 2 g topically 4 (four) times daily. 02/17/18  Yes Ali Lowe, MD  finasteride (PROSCAR) 5 MG tablet TAKE 1 TABLET BY MOUTH EVERY DAY 07/27/18  Yes Ali Lowe, MD  furosemide (LASIX) 20 MG tablet TAKE 1 TABLET (20 MG TOTAL) BY MOUTH DAILY. 03/03/18  Yes Bensimhon, Bevelyn Buckles, MD  KLOR-CON M20 20 MEQ tablet TAKE  1 TABLET BY MOUTH EVERY DAY 04/14/18  Yes Ali Lowe, MD  lidocaine (XYLOCAINE) 5 % ointment Apply 1 application topically as needed. 03/17/18  Yes Ali Lowe, MD  lisinopril (PRINIVIL,ZESTRIL) 5 MG tablet Take 1 tablet (5 mg total) by mouth 2 (two) times daily. 03/28/18  Yes Bensimhon, Bevelyn Buckles, MD  spironolactone (ALDACTONE) 25 MG tablet TAKE 1 TABLET (25 MG TOTAL) BY MOUTH DAILY. NEEDS OFFICE VISIT [phone] 05/12/18  Yes Bensimhon, Bevelyn Buckles, MD  warfarin (COUMADIN) 5 MG tablet 1 TABLET BY MOUTH DAILY AT 6PM ALL DAYS OF WEEK EXCEPT MON, WED, FRI ONLY TAKE 1/2 TABLET 07/26/18  Yes Ali Lowe, MD   Past Medical History:  Diagnosis Date  . Atrial fibrillation (HCC)    post op.  s/p dc-cv 04/2008. previously on amiodarone  . Atrial flutter (HCC)    atypical  . AV block, 1st degree    .450 msec--progressive  . 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  a- Echo 07/2008 Ef 55% mild peroprosthetic MVR with High transmitral gradient (mean 14).  b- normal coronaries by cath 12/2007  . BPH (benign prostatic hyperplasia)   . CHF (congestive heart failure) (HCC)    EF 35-40 % 2011 due to valvular disease and diastolic dysfunction  . Chronic back pain   . CKD (chronic kidney disease), stage III (HCC)   .  Dysphagia    with normal barium swallow 09/2008  . Endocarditis   . GERD (gastroesophageal reflux disease)   . Heavy alcohol use    history  . History of GI bleed   . Hypertension   . IV drug abuse (HCC)    history of  . Lower GI bleed 01/2016  . Pacemaker 2010   Social History   Socioeconomic History  . Marital status: Divorced    Spouse name: Not on file  . Number of children: 9  . Years of education: Not on file  . Highest education level: Not on file  Occupational History  . Occupation: retired  Engineer, production  . Financial resource strain: Not on file  . Food insecurity:    Worry: Not on file    Inability: Not on file  .  Transportation needs:    Medical: Not on file    Non-medical: Not on file  Tobacco Use  . Smoking status: Former Smoker    Years: 0.00    Types: Cigarettes  . Smokeless tobacco: Never Used  Substance and Sexual Activity  . Alcohol use: No    Alcohol/week: 0.0 standard drinks  . Drug use: No  . Sexual activity: Not on file  Lifestyle  . Physical activity:    Days per week: Not on file    Minutes per session: Not on file  . Stress: Not on file  Relationships  . Social connections:    Talks on phone: Not on file    Gets together: Not on file    Attends religious service: Not on file    Active member of club or organization: Not on file    Attends meetings of clubs or organizations: Not on file    Relationship status: Not on file  Other Topics Concern  . Not on file  Social History Narrative   Exercise everyday riding a bike for 1 hour   Family History  Problem Relation Age of Onset  . Coronary artery disease Mother     ASSESSMENT Recent Results: The most recent result is correlated with 30 mg per week: Lab Results  Component Value Date   INR 3.0 08/01/2018   INR 2.1 06/27/2018   INR 2.7 05/30/2018    Anticoagulation Dosing: Description   Take one (1) tablet on Mondays, Tuesdays, Wednesdays, Thursdays and Fridays. All other days, take only one-half (1/2) tablet of your 5mg  peach-colored warfarin tablets.      INR today: Therapeutic  PLAN Weekly dose was unchanged and remains on 30mg /wk.   Patient Instructions  Patient instructed to take medications as defined in the Anti-coagulation Track section of this encounter.  Patient instructed to take today's dose.  Patient instructed to take one (1) tablet of your 5mg  peach-colored warfarin tablets, Monday through Friday of each week. On Saturdays and Sundays, take only one-half (1/2) tablet.  Patient verbalized understanding of these instructions.     Patient advised to contact clinic or seek medical attention if  signs/symptoms of bleeding or thromboembolism occur.  Patient verbalized understanding by repeating back information and was advised to contact me if further medication-related questions arise. Patient was also provided an information handout.  Follow-up Return in 4 weeks (on 08/29/2018) for Follow up INR at 1000h.  Elicia Lamp, PharmD, CPP  15 minutes spent face-to-face with the patient during the encounter. 50% of time spent on education. 50% of time was spent on fingerstick point of care INR sample collection, processing, results determination and  documentation in [email or web address].kim.net/.

## 2018-08-01 NOTE — Patient Instructions (Signed)
Patient instructed to take medications as defined in the Anti-coagulation Track section of this encounter.  Patient instructed to take today's dose.  Patient instructed to take one (1) tablet of your 5mg  peach-colored warfarin tablets, Monday through Friday of each week. On Saturdays and Sundays, take only one-half (1/2) tablet.  Patient verbalized understanding of these instructions.

## 2018-08-03 NOTE — Progress Notes (Signed)
INTERNAL MEDICINE TEACHING ATTENDING ADDENDUM - Haroun Cotham M.D  Duration- indefinite, Indication- mechanical MVR, INR- therapeutic. Agree with pharmacy recommendations as outlined in their note.     

## 2018-08-29 ENCOUNTER — Ambulatory Visit: Payer: Medicare Other

## 2018-09-05 ENCOUNTER — Ambulatory Visit (INDEPENDENT_AMBULATORY_CARE_PROVIDER_SITE_OTHER): Payer: Medicare Other | Admitting: Pharmacist

## 2018-09-05 ENCOUNTER — Encounter: Payer: Self-pay | Admitting: Pharmacist

## 2018-09-05 DIAGNOSIS — Z5181 Encounter for therapeutic drug level monitoring: Secondary | ICD-10-CM

## 2018-09-05 DIAGNOSIS — Z7901 Long term (current) use of anticoagulants: Secondary | ICD-10-CM

## 2018-09-05 DIAGNOSIS — Z952 Presence of prosthetic heart valve: Secondary | ICD-10-CM | POA: Diagnosis not present

## 2018-09-05 DIAGNOSIS — Z8679 Personal history of other diseases of the circulatory system: Secondary | ICD-10-CM | POA: Diagnosis not present

## 2018-09-05 LAB — POCT INR: INR: 2.5 (ref 2.0–3.0)

## 2018-09-05 NOTE — Progress Notes (Signed)
Anticoagulation Management [name] is a 73 y.o. male who reports to the clinic for monitoring of warfarin treatment.    Indication: Atrial flutter, History of; long term current use of anticoagulant.    Duration: indefinite Supervising physician: Earl Lagos  Anticoagulation Clinic Visit History: Patient does not report signs/symptoms of bleeding or thromboembolism  Other recent changes: No diet, medications, lifestyle changes endorsed.  Anticoagulation Episode Summary    Current INR goal:   2.5-3.5  TTR:   63.9 % (6 y)  Next INR check:   08/29/2018  INR from last check:   2.5 (09/05/2018)  Weekly max warfarin dose:     Target end date:   Indefinite  INR check location:   Anticoagulation Clinic  Preferred lab:     Send INR reminders to:   [facility] [address]   Indications   IRREGULAR PULSE [I49.9] Long term (current) use of anticoagulants (Resolved) [Z79.01] ATRIAL FLUTTER (Resolved) [I48.92] H/O mitral valve replacement with mechanical valve [Z95.2]       Comments:         Anticoagulation Care Providers    Provider Role Specialty Phone number   [name], MD  Cardiology [phone]      No Known Allergies Prior to Admission medications   Medication Sig Start Date End Date Taking? Authorizing Provider  aspirin EC 81 MG tablet Take 1 tablet (81 mg total) by mouth daily. 12/28/17  Yes Duke Salvia, MD  carvedilol (COREG) 6.25 MG tablet TAKE 1 TABLET (6.25 MG TOTAL) BY MOUTH 2 (TWO) TIMES DAILY WITH A MEAL. 04/18/18  Yes Ali Lowe, MD  diclofenac sodium (VOLTAREN) 1 % GEL Apply 2 g topically 4 (four) times daily. 02/17/18  Yes Ali Lowe, MD  finasteride (PROSCAR) 5 MG tablet TAKE 1 TABLET BY MOUTH EVERY DAY 07/27/18  Yes Ali Lowe, MD  furosemide (LASIX) 20 MG tablet TAKE 1 TABLET (20 MG TOTAL) BY MOUTH DAILY. 03/03/18  Yes Bensimhon, Bevelyn Buckles, MD  KLOR-CON M20 20 MEQ tablet TAKE 1 TABLET BY MOUTH EVERY DAY 04/14/18  Yes Ali Lowe, MD  lidocaine (XYLOCAINE) 5 % ointment Apply 1 application topically as needed. 03/17/18  Yes Ali Lowe, MD  lisinopril (PRINIVIL,ZESTRIL) 5 MG tablet Take 1 tablet (5 mg total) by mouth 2 (two) times daily. 03/28/18  Yes Bensimhon, Bevelyn Buckles, MD  spironolactone (ALDACTONE) 25 MG tablet TAKE 1 TABLET (25 MG TOTAL) BY MOUTH DAILY. NEEDS OFFICE VISIT [phone] 05/12/18  Yes Bensimhon, Bevelyn Buckles, MD  warfarin (COUMADIN) 5 MG tablet 1 TABLET BY MOUTH DAILY AT 6PM ALL DAYS OF WEEK EXCEPT MON, WED, FRI ONLY TAKE 1/2 TABLET 07/26/18  Yes Ali Lowe, MD   Past Medical History:  Diagnosis Date  . Atrial fibrillation (HCC)    post op.  s/p dc-cv 04/2008. previously on amiodarone  . Atrial flutter (HCC)    atypical  . AV block, 1st degree    .450 msec--progressive  . 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  a- Echo 07/2008 Ef 55% mild peroprosthetic MVR with High transmitral gradient (mean 14).  b- normal coronaries by cath 12/2007  . BPH (benign prostatic hyperplasia)   . CHF (congestive heart failure) (HCC)    EF 35-40 % 2011 due to valvular disease and diastolic dysfunction  . Chronic back pain   . CKD (chronic kidney disease), stage III (HCC)   . Dysphagia  with normal barium swallow 09/2008  . Endocarditis   . GERD (gastroesophageal reflux disease)   . Heavy alcohol use    history  . History of GI bleed   . Hypertension   . IV drug abuse (HCC)    history of  . Lower GI bleed 01/2016  . Pacemaker 2010   Social History   Socioeconomic History  . Marital status: Divorced    Spouse name: Not on file  . Number of children: 9  . Years of education: Not on file  . Highest education level: Not on file  Occupational History  . Occupation: retired  Engineer, production  . Financial resource strain: Not on file  . Food insecurity:    Worry: Not on file    Inability: Not on file  . Transportation needs:    Medical: Not on file     Non-medical: Not on file  Tobacco Use  . Smoking status: Former Smoker    Years: 0.00    Types: Cigarettes  . Smokeless tobacco: Never Used  Substance and Sexual Activity  . Alcohol use: No    Alcohol/week: 0.0 standard drinks  . Drug use: No  . Sexual activity: Not on file  Lifestyle  . Physical activity:    Days per week: Not on file    Minutes per session: Not on file  . Stress: Not on file  Relationships  . Social connections:    Talks on phone: Not on file    Gets together: Not on file    Attends religious service: Not on file    Active member of club or organization: Not on file    Attends meetings of clubs or organizations: Not on file    Relationship status: Not on file  Other Topics Concern  . Not on file  Social History Narrative   Exercise everyday riding a bike for 1 hour   Family History  Problem Relation Age of Onset  . Coronary artery disease Mother     ASSESSMENT Recent Results: The most recent result is correlated with 30 mg per week: Lab Results  Component Value Date   INR 2.5 09/05/2018   INR 3.0 08/01/2018   INR 2.1 06/27/2018    Anticoagulation Dosing: Description   Take one (1) tablet daily, except on Mondays--take 1 and 1/2  Of your 5mg  peach-colored tablets every Monday.      INR today: Therapeutic  PLAN Weekly dose was increased by 25% to 37.5 mg per week  Patient Instructions  Patient instructed to take medications as defined in the Anti-coagulation Track section of this encounter.  Patient instructed to take today's dose.  Patient instructed to take one (1) tablet daily, except on Mondays--take 1 and 1/2  Of your 5mg  peach-colored tablets every Monday.  Patient verbalized understanding of these instructions.     Patient advised to contact clinic or seek medical attention if signs/symptoms of bleeding or thromboembolism occur.  Patient verbalized understanding by repeating back information and was advised to contact me if  further medication-related questions arise. Patient was also provided an information handout.  Follow-up Return in 3 weeks (on 09/26/2018) for Follow up INR at 1015h.  Elicia Lamp, PharmD, CPP  15 minutes spent face-to-face with the patient during the encounter. 50% of time spent on education, including signs/sx bleeding and clotting, as well as food and drug interactions with warfarin. 50% of time was spent on fingerprick POC INR sample collection,processing, results determination, and documentation in [email or web address].

## 2018-09-05 NOTE — Patient Instructions (Signed)
Patient instructed to take medications as defined in the Anti-coagulation Track section of this encounter.  Patient instructed to take today's dose.  Patient instructed to take one (1) tablet daily, except on Mondays--take 1 and 1/2  Of your 5mg  peach-colored tablets every Monday.  Patient verbalized understanding of these instructions.

## 2018-09-05 NOTE — Progress Notes (Signed)
INTERNAL MEDICINE TEACHING ATTENDING ADDENDUM - Connery Shiffler M.D  Duration- indefinite, Indication- mechanical MVR, INR- therapeutic. Agree with pharmacy recommendations as outlined in their note.     

## 2018-09-19 ENCOUNTER — Other Ambulatory Visit: Payer: Self-pay | Admitting: Internal Medicine

## 2018-09-19 DIAGNOSIS — I5022 Chronic systolic (congestive) heart failure: Secondary | ICD-10-CM

## 2018-09-21 ENCOUNTER — Other Ambulatory Visit: Payer: Self-pay | Admitting: Pharmacist

## 2018-09-21 DIAGNOSIS — Z952 Presence of prosthetic heart valve: Secondary | ICD-10-CM

## 2018-09-21 DIAGNOSIS — Z7901 Long term (current) use of anticoagulants: Secondary | ICD-10-CM

## 2018-09-21 DIAGNOSIS — I4892 Unspecified atrial flutter: Secondary | ICD-10-CM

## 2018-09-23 ENCOUNTER — Other Ambulatory Visit: Payer: Self-pay | Admitting: *Deleted

## 2018-09-23 DIAGNOSIS — Z952 Presence of prosthetic heart valve: Secondary | ICD-10-CM

## 2018-09-23 DIAGNOSIS — I4892 Unspecified atrial flutter: Secondary | ICD-10-CM

## 2018-09-23 DIAGNOSIS — Z7901 Long term (current) use of anticoagulants: Secondary | ICD-10-CM

## 2018-09-26 ENCOUNTER — Ambulatory Visit: Payer: Medicare Other

## 2018-10-03 DIAGNOSIS — Z7901 Long term (current) use of anticoagulants: Secondary | ICD-10-CM | POA: Diagnosis not present

## 2018-10-03 DIAGNOSIS — I4892 Unspecified atrial flutter: Secondary | ICD-10-CM | POA: Diagnosis not present

## 2018-10-03 DIAGNOSIS — Z952 Presence of prosthetic heart valve: Secondary | ICD-10-CM | POA: Diagnosis not present

## 2018-10-04 ENCOUNTER — Telehealth: Payer: Self-pay | Admitting: Pharmacist

## 2018-10-04 ENCOUNTER — Other Ambulatory Visit: Payer: Self-pay | Admitting: *Deleted

## 2018-10-04 DIAGNOSIS — I4892 Unspecified atrial flutter: Secondary | ICD-10-CM

## 2018-10-04 DIAGNOSIS — Z7901 Long term (current) use of anticoagulants: Secondary | ICD-10-CM

## 2018-10-04 DIAGNOSIS — Z952 Presence of prosthetic heart valve: Secondary | ICD-10-CM

## 2018-10-04 LAB — PROTIME-INR
INR: 4.8 — ABNORMAL HIGH (ref 0.8–1.2)
Prothrombin Time: 46.9 s — ABNORMAL HIGH (ref 9.1–12.0)

## 2018-10-04 NOTE — Telephone Encounter (Signed)
Patient was called and apprised of his INR = 4.8 collected at [facility] 03-Oct-2018 and made available to me today. Was advised to DECREASE dose to only 1/2 tablet of his 5mg  peach-colored warfarin tablet every Tuesday. All other days, only one (1) tablet. Repeat INR 31-Oct-2018. Denies any signs or symptoms of bleeding or embolic disease.

## 2018-10-26 ENCOUNTER — Other Ambulatory Visit: Payer: Self-pay | Admitting: Internal Medicine

## 2018-10-26 DIAGNOSIS — I4892 Unspecified atrial flutter: Secondary | ICD-10-CM

## 2018-11-07 DIAGNOSIS — Z7901 Long term (current) use of anticoagulants: Secondary | ICD-10-CM | POA: Diagnosis not present

## 2018-11-07 DIAGNOSIS — I4892 Unspecified atrial flutter: Secondary | ICD-10-CM | POA: Diagnosis not present

## 2018-11-07 DIAGNOSIS — Z952 Presence of prosthetic heart valve: Secondary | ICD-10-CM | POA: Diagnosis not present

## 2018-11-07 LAB — PROTIME-INR
INR: 2.2 — ABNORMAL HIGH (ref 0.8–1.2)
Prothrombin Time: 22.4 s — ABNORMAL HIGH (ref 9.1–12.0)

## 2018-11-08 ENCOUNTER — Other Ambulatory Visit: Payer: Self-pay | Admitting: *Deleted

## 2018-11-08 ENCOUNTER — Telehealth: Payer: Self-pay | Admitting: Pharmacist

## 2018-11-08 DIAGNOSIS — Z952 Presence of prosthetic heart valve: Secondary | ICD-10-CM

## 2018-11-08 DIAGNOSIS — Z7901 Long term (current) use of anticoagulants: Secondary | ICD-10-CM

## 2018-11-08 DIAGNOSIS — I4892 Unspecified atrial flutter: Secondary | ICD-10-CM

## 2018-11-08 NOTE — Telephone Encounter (Signed)
Reviewed Thanks DrG 

## 2018-11-08 NOTE — Telephone Encounter (Signed)
Called patient and shared INR value 2.2 (goal 2.5-3.5) on 32.5mg  warfarin/wk. Will INCREASE to 35mg  warfarin/wk. Repeat INR 05-Dec-2018. No signs/symptoms of bleeding endorsed.

## 2018-11-16 ENCOUNTER — Other Ambulatory Visit (HOSPITAL_COMMUNITY): Payer: Self-pay | Admitting: Internal Medicine

## 2018-12-19 ENCOUNTER — Ambulatory Visit (INDEPENDENT_AMBULATORY_CARE_PROVIDER_SITE_OTHER): Payer: Medicare Other | Admitting: Pharmacist

## 2018-12-19 ENCOUNTER — Other Ambulatory Visit: Payer: Self-pay | Admitting: Internal Medicine

## 2018-12-19 ENCOUNTER — Other Ambulatory Visit: Payer: Self-pay

## 2018-12-19 DIAGNOSIS — Z7901 Long term (current) use of anticoagulants: Secondary | ICD-10-CM | POA: Diagnosis not present

## 2018-12-19 DIAGNOSIS — Z5181 Encounter for therapeutic drug level monitoring: Secondary | ICD-10-CM

## 2018-12-19 DIAGNOSIS — Z952 Presence of prosthetic heart valve: Secondary | ICD-10-CM | POA: Diagnosis not present

## 2018-12-19 DIAGNOSIS — I499 Cardiac arrhythmia, unspecified: Secondary | ICD-10-CM

## 2018-12-19 DIAGNOSIS — I5022 Chronic systolic (congestive) heart failure: Secondary | ICD-10-CM

## 2018-12-19 LAB — POCT INR: INR: 3 (ref 2.0–3.0)

## 2018-12-19 NOTE — Patient Instructions (Signed)
Patient instructed to take medications as defined in the Anti-coagulation Track section of this encounter.  Patient instructed to take today's dose.  Patient instructed to take one (1) tablet daily of your 5mg  peach-colored warfarin tablets at [facility] each day. Patient verbalized understanding of these instructions.

## 2018-12-19 NOTE — Progress Notes (Signed)
Anticoagulation Management [name] is a 73 y.o. male who reports to the clinic for monitoring of warfarin treatment.    Indication: History of mechanical heart valve; History of atrial flutter; Long term (current) use of anticoagulant.    Duration: indefinite Supervising physician: Joni Reining  Anticoagulation Clinic Visit History: Patient does not report signs/symptoms of bleeding or thromboembolism  Other recent changes: No diet, medications, lifestyle changes endorsed.  Anticoagulation Episode Summary    Current INR goal:  2.5-3.5  TTR:  63.3 % (6.3 y)  Next INR check:  01/16/2019  INR from last check:  3.0 (12/19/2018)  Weekly max warfarin dose:    Target end date:  Indefinite  INR check location:  Anticoagulation Clinic  Preferred lab:    Send INR reminders to:  [facility]   Indications   IRREGULAR PULSE [I49.9] Long term (current) use of anticoagulants (Resolved) [Z79.01] ATRIAL FLUTTER (Resolved) [I48.92] H/O mitral valve replacement with mechanical valve [Z95.2]       Comments:        Anticoagulation Care Providers    Provider Role Specialty Phone number   Jolaine Artist, MD  Cardiology [phone]      No Known Allergies  Current Outpatient Medications:  .  aspirin EC 81 MG tablet, Take 1 tablet (81 mg total) by mouth daily., Disp: 90 tablet, Rfl: 3 .  carvedilol (COREG) 6.25 MG tablet, TAKE 1 TABLET (6.25 MG TOTAL) BY MOUTH 2 (TWO) TIMES DAILY WITH A MEAL., Disp: 60 tablet, Rfl: 3 .  diclofenac sodium (VOLTAREN) 1 % GEL, Apply 2 g topically 4 (four) times daily., Disp: 1 Tube, Rfl: 1 .  finasteride (PROSCAR) 5 MG tablet, TAKE 1 TABLET BY MOUTH EVERY DAY, Disp: 30 tablet, Rfl: 5 .  furosemide (LASIX) 20 MG tablet, TAKE 1 TABLET (20 MG TOTAL) BY MOUTH DAILY., Disp: 90 tablet, Rfl: 3 .  KLOR-CON M20 20 MEQ tablet, TAKE 1 TABLET BY MOUTH EVERY DAY, Disp: 90 tablet, Rfl: 3 .  lidocaine (XYLOCAINE) 5 % ointment, Apply 1 application  topically as needed., Disp: 35.44 g, Rfl: 0 .  lisinopril (PRINIVIL,ZESTRIL) 5 MG tablet, Take 1 tablet (5 mg total) by mouth 2 (two) times daily., Disp: 60 tablet, Rfl: 3 .  lisinopril (ZESTRIL) 5 MG tablet, Take 1 tablet (5 mg total) by mouth 2 (two) times daily. Please make follow up appt at office [phone], Disp: 60 tablet, Rfl: 0 .  spironolactone (ALDACTONE) 25 MG tablet, Take 1 tablet (25 mg total) by mouth daily. Please make follow up appt at office [phone], Disp: 90 tablet, Rfl: 0 .  warfarin (COUMADIN) 5 MG tablet, 1 TABLET BY MOUTH DAILY AT 6PM ALL DAYS OF WEEK EXCEPT TUES ONLY TAKE 1/2 TABLET, Disp: 72 tablet, Rfl: 1 Past Medical History:  Diagnosis Date  . Atrial fibrillation ([place])    post op.  s/p dc-cv 04/2008. previously on amiodarone  . Atrial flutter ([place])    atypical  . AV block, 1st degree    .450 msec--progressive  . 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  a- Echo 07/2008 Ef 55% mild peroprosthetic MVR with High transmitral gradient (mean 14).  b- normal coronaries by cath 12/2007  . BPH (benign prostatic hyperplasia)   . CHF (congestive heart failure) (HCC)    EF 35-40 % 2011 due to valvular disease and diastolic dysfunction  . Chronic back pain   . CKD (chronic kidney disease), stage III (  HCC)   . Dysphagia    with normal barium swallow 09/2008  . Endocarditis   . GERD (gastroesophageal reflux disease)   . Heavy alcohol use    history  . History of GI bleed   . Hypertension   . IV drug abuse (HCC)    history of  . Lower GI bleed 01/2016  . Pacemaker 2010   Social History   Socioeconomic History  . Marital status: Divorced    Spouse name: Not on file  . Number of children: 9  . Years of education: Not on file  . Highest education level: Not on file  Occupational History  . Occupation: retired  Engineer, production  . Financial resource strain: Not on file  . Food insecurity    Worry: Not on file    Inability: Not  on file  . Transportation needs    Medical: Not on file    Non-medical: Not on file  Tobacco Use  . Smoking status: Former Smoker    Years: 0.00    Types: Cigarettes  . Smokeless tobacco: Never Used  Substance and Sexual Activity  . Alcohol use: No    Alcohol/week: 0.0 standard drinks  . Drug use: No  . Sexual activity: Not on file  Lifestyle  . Physical activity    Days per week: Not on file    Minutes per session: Not on file  . Stress: Not on file  Relationships  . Social Musician on phone: Not on file    Gets together: Not on file    Attends religious service: Not on file    Active member of club or organization: Not on file    Attends meetings of clubs or organizations: Not on file    Relationship status: Not on file  Other Topics Concern  . Not on file  Social History Narrative   Exercise everyday riding a bike for 1 hour   Family History  Problem Relation Age of Onset  . Coronary artery disease Mother     ASSESSMENT Recent Results: The most recent result is correlated with 35 mg per week: Lab Results  Component Value Date   INR 3.0 12/19/2018   INR 2.2 (H) 11/07/2018   INR 4.8 (H) 10/03/2018    Anticoagulation Dosing: Description   Take one (1) tablet daily of your 5mg  peach-colored warfarin tablets at [facility] each day.      INR today: Therapeutic  PLAN Weekly dose was unchanged. Continue to take 5mg  of warfarin daily.   Patient Instructions  Patient instructed to take medications as defined in the Anti-coagulation Track section of this encounter.  Patient instructed to take today's dose.  Patient instructed to take one (1) tablet daily of your 5mg  peach-colored warfarin tablets at [facility] each day. Patient verbalized understanding of these instructions.     Patient advised to contact clinic or seek medical attention if signs/symptoms of bleeding or thromboembolism occur.  Patient verbalized understanding by repeating back information and  was advised to contact me if further medication-related questions arise. Patient was also provided an information handout.  Follow-up Return in 4 weeks (on 01/16/2019) for Follow up INR at 1000h with Dr. [name].  Elicia Lamp, PharmD, CPP  15 minutes spent face-to-face with the patient during the encounter. 50% of time spent on education, including signs/sx bleeding and clotting, as well as food and drug interactions with warfarin. 50% of time was spent on fingerprick POC INR sample collection,processing, results  determination, and documentation in [email or web address].

## 2018-12-24 ENCOUNTER — Encounter: Payer: Self-pay | Admitting: *Deleted

## 2019-01-16 ENCOUNTER — Other Ambulatory Visit: Payer: Self-pay

## 2019-01-16 ENCOUNTER — Ambulatory Visit (INDEPENDENT_AMBULATORY_CARE_PROVIDER_SITE_OTHER): Payer: Medicare Other | Admitting: Pharmacist

## 2019-01-16 DIAGNOSIS — Z952 Presence of prosthetic heart valve: Secondary | ICD-10-CM | POA: Diagnosis not present

## 2019-01-16 DIAGNOSIS — Z7901 Long term (current) use of anticoagulants: Secondary | ICD-10-CM

## 2019-01-16 DIAGNOSIS — Z8679 Personal history of other diseases of the circulatory system: Secondary | ICD-10-CM | POA: Diagnosis not present

## 2019-01-16 LAB — POCT INR: INR: 3.5 — AB (ref 2.0–3.0)

## 2019-01-16 NOTE — Patient Instructions (Signed)
Patient instructed to take medications as defined in the Anti-coagulation Track section of this encounter.  Patient instructed to take today's dose.  Patient instructed to take one (1) tablet daily of your 5mg  peach-colored warfarin tablets at [facility] each day--EXCEPT on MONDAYS, take ONLY one-half (1/2) of your 5mg  peach-colored warfarin tablets on Mondays.   Patient verbalized understanding of these instructions.

## 2019-01-16 NOTE — Progress Notes (Signed)
Anticoagulation Management [name] is a 73 y.o. male who reports to the clinic for monitoring of warfarin treatment.    Indication:History of mitral valve replacement; Long term current use of anticoagulant.  Duration: indefinite Supervising physician: Earl Lagos  Anticoagulation Clinic Visit History: Patient does not report signs/symptoms of bleeding or thromboembolism  Other recent changes: No diet, medications, lifestyle changes.  Anticoagulation Episode Summary    Current INR goal:  2.5-3.5  TTR:  63.7 % (6.3 y)  Next INR check:  02/20/2019  INR from last check:  3.5 (01/16/2019)  Weekly max warfarin dose:    Target end date:  Indefinite  INR check location:  Anticoagulation Clinic  Preferred lab:    Send INR reminders to:  [facility] [address]   Indications   IRREGULAR PULSE [I49.9] Long term (current) use of anticoagulants (Resolved) [Z79.01] ATRIAL FLUTTER (Resolved) [I48.92] H/O mitral valve replacement with mechanical valve [Z95.2]       Comments:        Anticoagulation Care Providers    Provider Role Specialty Phone number   [name], MD  Cardiology [phone]      No Known Allergies  Current Outpatient Medications:  .  aspirin EC 81 MG tablet, Take 1 tablet (81 mg total) by mouth daily., Disp: 90 tablet, Rfl: 3 .  carvedilol (COREG) 6.25 MG tablet, TAKE 1 TABLET (6.25 MG TOTAL) BY MOUTH 2 (TWO) TIMES DAILY WITH A MEAL., Disp: 180 tablet, Rfl: 1 .  diclofenac sodium (VOLTAREN) 1 % GEL, Apply 2 g topically 4 (four) times daily., Disp: 1 Tube, Rfl: 1 .  finasteride (PROSCAR) 5 MG tablet, TAKE 1 TABLET BY MOUTH EVERY DAY, Disp: 30 tablet, Rfl: 5 .  furosemide (LASIX) 20 MG tablet, TAKE 1 TABLET (20 MG TOTAL) BY MOUTH DAILY., Disp: 90 tablet, Rfl: 3 .  KLOR-CON M20 20 MEQ tablet, TAKE 1 TABLET BY MOUTH EVERY DAY, Disp: 90 tablet, Rfl: 3 .  lidocaine (XYLOCAINE) 5 % ointment, Apply 1 application topically as needed., Disp: 35.44 g,  Rfl: 0 .  lisinopril (PRINIVIL,ZESTRIL) 5 MG tablet, Take 1 tablet (5 mg total) by mouth 2 (two) times daily., Disp: 60 tablet, Rfl: 3 .  lisinopril (ZESTRIL) 5 MG tablet, Take 1 tablet (5 mg total) by mouth 2 (two) times daily. Please make follow up appt at office [phone], Disp: 60 tablet, Rfl: 0 .  spironolactone (ALDACTONE) 25 MG tablet, Take 1 tablet (25 mg total) by mouth daily. Please make follow up appt at office [phone], Disp: 90 tablet, Rfl: 0 .  warfarin (COUMADIN) 5 MG tablet, 1 TABLET BY MOUTH DAILY AT 6PM ALL DAYS OF WEEK EXCEPT TUES ONLY TAKE 1/2 TABLET, Disp: 72 tablet, Rfl: 1 Past Medical History:  Diagnosis Date  . Atrial fibrillation (HCC)    post op.  s/p dc-cv 04/2008. previously on amiodarone  . Atrial flutter (HCC)    atypical  . AV block, 1st degree    .450 msec--progressive  . 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  a- Echo 07/2008 Ef 55% mild peroprosthetic MVR with High transmitral gradient (mean 14).  b- normal coronaries by cath 12/2007  . BPH (benign prostatic hyperplasia)   . CHF (congestive heart failure) (HCC)    EF 35-40 % 2011 due to valvular disease and diastolic dysfunction  . Chronic back pain   . CKD (chronic kidney disease), stage III (HCC)   . Dysphagia  with normal barium swallow 09/2008  . Endocarditis   . GERD (gastroesophageal reflux disease)   . Heavy alcohol use    history  . History of GI bleed   . Hypertension   . IV drug abuse (HCC)    history of  . Lower GI bleed 01/2016  . Pacemaker 2010   Social History   Socioeconomic History  . Marital status: Divorced    Spouse name: Not on file  . Number of children: 9  . Years of education: Not on file  . Highest education level: Not on file  Occupational History  . Occupation: retired  Engineer, productionocial Needs  . Financial resource strain: Not on file  . Food insecurity    Worry: Not on file    Inability: Not on file  . Transportation needs     Medical: Not on file    Non-medical: Not on file  Tobacco Use  . Smoking status: Former Smoker    Years: 0.00    Types: Cigarettes  . Smokeless tobacco: Never Used  Substance and Sexual Activity  . Alcohol use: No    Alcohol/week: 0.0 standard drinks  . Drug use: No  . Sexual activity: Not on file  Lifestyle  . Physical activity    Days per week: Not on file    Minutes per session: Not on file  . Stress: Not on file  Relationships  . Social Musicianconnections    Talks on phone: Not on file    Gets together: Not on file    Attends religious service: Not on file    Active member of club or organization: Not on file    Attends meetings of clubs or organizations: Not on file    Relationship status: Not on file  Other Topics Concern  . Not on file  Social History Narrative   Exercise everyday riding a bike for 1 hour   Family History  Problem Relation Age of Onset  . Coronary artery disease Mother     ASSESSMENT Recent Results: The most recent result is correlated with 35 mg per week: Lab Results  Component Value Date   INR 3.5 (A) 01/16/2019   INR 3.0 12/19/2018   INR 2.2 (H) 11/07/2018    Anticoagulation Dosing: Description   Take one (1) tablet daily of your 5mg  peach-colored warfarin tablets at 6PM each day--EXCEPT on MONDAYS, take ONLY one-half (1/2) of your 5mg  peach-colored warfarin tablets on Mondays.       INR today: Therapeutic  PLAN Weekly dose was decreased by 7% to 32.5 mg per week  Patient Instructions  Patient instructed to take medications as defined in the Anti-coagulation Track section of this encounter.  Patient instructed to take today's dose.  Patient instructed to take one (1) tablet daily of your 5mg  peach-colored warfarin tablets at [facility]6PM each day--EXCEPT on MONDAYS, take ONLY one-half (1/2) of your 5mg  peach-colored warfarin tablets on Mondays.   Patient verbalized understanding of these instructions.     Patient advised to contact clinic or  seek medical attention if signs/symptoms of bleeding or thromboembolism occur.  Patient verbalized understanding by repeating back information and was advised to contact me if further medication-related questions arise. Patient was also provided an information handout.  Follow-up Return in about 5 weeks (around 02/20/2019) for Follow up INR.  Elicia LampJames B Roylene Heaton, PharmD, CPP  15 minutes spent face-to-face with the patient during the encounter. 50% of time spent on education, including signs/sx bleeding and clotting, as well as  food and drug interactions with warfarin. 50% of time was spent on fingerprick POC INR sample collection,processing, results determination, and documentation in http://www.kim.[email or web address]/.

## 2019-01-18 NOTE — Progress Notes (Signed)
INTERNAL MEDICINE TEACHING ATTENDING ADDENDUM - Jericha Bryden M.D  Duration- indefinite, Indication- mechanical MVR, INR- therapeutic. Agree with pharmacy recommendations as outlined in their note.     

## 2019-02-16 ENCOUNTER — Encounter: Payer: Self-pay | Admitting: Internal Medicine

## 2019-02-16 ENCOUNTER — Ambulatory Visit (INDEPENDENT_AMBULATORY_CARE_PROVIDER_SITE_OTHER): Payer: Medicare Other | Admitting: Internal Medicine

## 2019-02-16 ENCOUNTER — Other Ambulatory Visit: Payer: Self-pay

## 2019-02-16 VITALS — BP 114/69 | HR 74 | Temp 98.1°F | Ht 68.0 in | Wt 178.0 lb

## 2019-02-16 DIAGNOSIS — R351 Nocturia: Secondary | ICD-10-CM | POA: Diagnosis not present

## 2019-02-16 DIAGNOSIS — B356 Tinea cruris: Secondary | ICD-10-CM

## 2019-02-16 DIAGNOSIS — Z0000 Encounter for general adult medical examination without abnormal findings: Secondary | ICD-10-CM

## 2019-02-16 DIAGNOSIS — Z7901 Long term (current) use of anticoagulants: Secondary | ICD-10-CM

## 2019-02-16 DIAGNOSIS — N401 Enlarged prostate with lower urinary tract symptoms: Secondary | ICD-10-CM | POA: Diagnosis not present

## 2019-02-16 MED ORDER — TERBINAFINE HCL 1 % EX CREA: 1.0000 "application " | TOPICAL_CREAM | Freq: Two times a day (BID) | CUTANEOUS | 0 refills | Status: DC

## 2019-02-16 NOTE — Patient Instructions (Addendum)
Annual Wellness Visit   Medicare Covered Preventative Screenings and Services  Services & Screenings Men and Women Who How Often Need? Date of Last Service Action  Abdominal Aortic Aneurysm Adults with AAA risk factors Once     Alcohol Misuse and Counseling All Adults Screening once a year if no alcohol misuse. Counseling up to 4 face to face sessions.     Bone Density Measurement  Adults at risk for osteoporosis Once every 2 yrs     Lipid Panel Z13.6 All adults without CV disease Once every 5 yrs     Colorectal Cancer   Stool sample or  Colonoscopy All adults 50 and older   Once every year  Every 10 years     Depression All Adults Once a year  Today   Diabetes Screening Blood glucose, post glucose load, or GTT Z13.1  All adults at risk  Pre-diabetics  Once per year  Twice per year     Diabetes  Self-Management Training All adults Diabetics 10 hrs first year; 2 hours subsequent years. Requires Copay     Glaucoma  Diabetics  Family history of glaucoma  African Americans 50 yrs +  Hispanic Americans 65 yrs + Annually - requires coppay     Hepatitis C Z72.89 or F19.20  High Risk for HCV  Born between 17-Jun-1946 and 1967  Annually  Once     HIV Z11.4 All adults based on risk  Annually btw ages 55 & 42 regardless of risk  Annually > 65 yrs if at increased risk     Lung Cancer Screening Asymptomatic adults aged 69-77 with 30 pack yr history and current smoker OR quit within the last 15 yrs Annually Must have counseling and shared decision making documentation before first screen     Medical Nutrition Therapy Adults with   Diabetes  Renal disease  Kidney transplant within past 3 yrs 3 hours first year; 2 hours subsequent years     Obesity and Counseling All adults Screening once a year Counseling if BMI 30 or higher  Today   Tobacco Use Counseling Adults who use tobacco  Up to 8 visits in one year     Vaccines Z23  Hepatitis B  Influenza   Pneumonia  Adults    Once  Once every flu season  Two different vaccines separated by one year      Flu vaccine starting September 1  Next Annual Wellness Visit People with Medicare Every year  Today     Services & Screenings Women Who How Often Need  Date of Last Service Action  Mammogram  Z12.31 Women over 40 One baseline ages 49-39. Annually ager 40 yrs+     Pap tests All women Annually if high risk. Every 2 yrs for normal risk women     Screening for cervical cancer with   Pap (Z01.419 nl or Z01.411abnl) &  HPV Z11.51 Women aged 43 to 57 Once every 5 yrs     Screening pelvic and breast exams All women Annually if high risk. Every 2 yrs for normal risk women     Sexually Transmitted Diseases  Chlamydia  Gonorrhea  Syphilis All at risk adults Annually for non pregnant females at increased risk         Services & Screenings Men Who How Ofter Need  Date of Last Service Action  Prostate Cancer - DRE & PSA Men over 50 Annually.  DRE might require a copay. X 09/09/2018  Repeat today  Sexually  Transmitted Diseases  Syphilis All at risk adults Annually for men at increased risk         Things That May Be Affecting Your Health:  Alcohol  Hearing loss  Pain    Depression  Home Safety  Sexual Health   Diabetes  Lack of physical activity  Stress   Difficulty with daily activities  Loneliness X Tiredness   Drug use  Medicines  Tobacco use   Falls  Motor Vehicle Safety  Weight  X Food choices  Oral Health  Other    YOUR PERSONALIZED HEALTH PLAN : 1. Schedule your next subsequent Medicare Wellness visit in one year 2. Attend all of your regular appointments to address your medical issues 3. Complete the preventative screenings and services 4. Keep up the good work with riding your bike! Be sure to change your clothes after to prevent rash.   DASH Eating Plan DASH stands for "Dietary Approaches to Stop Hypertension." The DASH eating plan is a healthy eating plan that has been shown to  reduce high blood pressure (hypertension). It may also reduce your risk for type 2 diabetes, heart disease, and stroke. The DASH eating plan may also help with weight loss. What are tips for following this plan?  General guidelines  Avoid eating more than 2,300 mg (milligrams) of salt (sodium) a day. If you have hypertension, you may need to reduce your sodium intake to 1,500 mg a day.  Limit alcohol intake to no more than 1 drink a day for nonpregnant women and 2 drinks a day for men. One drink equals 12 oz of beer, 5 oz of wine, or 1 oz of hard liquor.  Work with your health care provider to maintain a healthy body weight or to lose weight. Ask what an ideal weight is for you.  Get at least 30 minutes of exercise that causes your heart to beat faster (aerobic exercise) most days of the week. Activities may include walking, swimming, or biking.  Work with your health care provider or diet and nutrition specialist (dietitian) to adjust your eating plan to your individual calorie needs. Reading food labels   Check food labels for the amount of sodium per serving. Choose foods with less than 5 percent of the Daily Value of sodium. Generally, foods with less than 300 mg of sodium per serving fit into this eating plan.  To find whole grains, look for the word "whole" as the first word in the ingredient list. Shopping  Buy products labeled as "low-sodium" or "no salt added."  Buy fresh foods. Avoid canned foods and premade or frozen meals. Cooking  Avoid adding salt when cooking. Use salt-free seasonings or herbs instead of table salt or sea salt. Check with your health care provider or pharmacist before using salt substitutes.  Do not fry foods. Cook foods using healthy methods such as baking, boiling, grilling, and broiling instead.  Cook with heart-healthy oils, such as olive, canola, soybean, or sunflower oil. Meal planning  Eat a balanced diet that includes: ? 5 or more servings  of fruits and vegetables each day. At each meal, try to fill half of your plate with fruits and vegetables. ? Up to 6-8 servings of whole grains each day. ? Less than 6 oz of lean meat, poultry, or fish each day. A 3-oz serving of meat is about the same size as a deck of cards. One egg equals 1 oz. ? 2 servings of low-fat dairy each day. ? A serving  of nuts, seeds, or beans 5 times each week. ? Heart-healthy fats. Healthy fats called Omega-3 fatty acids are found in foods such as flaxseeds and coldwater fish, like sardines, salmon, and mackerel.  Limit how much you eat of the following: ? Canned or prepackaged foods. ? Food that is high in trans fat, such as fried foods. ? Food that is high in saturated fat, such as fatty meat. ? Sweets, desserts, sugary drinks, and other foods with added sugar. ? Full-fat dairy products.  Do not salt foods before eating.  Try to eat at least 2 vegetarian meals each week.  Eat more home-cooked food and less restaurant, buffet, and fast food.  When eating at a restaurant, ask that your food be prepared with less salt or no salt, if possible. What foods are recommended? The items listed may not be a complete list. Talk with your dietitian about what dietary choices are best for you. Grains Whole-grain or whole-wheat bread. Whole-grain or whole-wheat pasta. Brown rice. Orpah Cobbatmeal. Quinoa. Bulgur. Whole-grain and low-sodium cereals. Pita bread. Low-fat, low-sodium crackers. Whole-wheat flour tortillas. Vegetables Fresh or frozen vegetables (raw, steamed, roasted, or grilled). Low-sodium or reduced-sodium tomato and vegetable juice. Low-sodium or reduced-sodium tomato sauce and tomato paste. Low-sodium or reduced-sodium canned vegetables. Fruits All fresh, dried, or frozen fruit. Canned fruit in natural juice (without added sugar). Meat and other protein foods Skinless chicken or Malawiturkey. Ground chicken or [place]urkey. Pork with fat trimmed off. Fish and seafood. Egg  whites. Dried beans, peas, or lentils. Unsalted nuts, nut butters, and seeds. Unsalted canned beans. Lean cuts of beef with fat trimmed off. Low-sodium, lean deli meat. Dairy Low-fat (1%) or fat-free (skim) milk. Fat-free, low-fat, or reduced-fat cheeses. Nonfat, low-sodium ricotta or cottage cheese. Low-fat or nonfat yogurt. Low-fat, low-sodium cheese. Fats and oils Soft margarine without trans fats. Vegetable oil. Low-fat, reduced-fat, or light mayonnaise and salad dressings (reduced-sodium). Canola, safflower, olive, soybean, and sunflower oils. Avocado. Seasoning and other foods Herbs. Spices. Seasoning mixes without salt. Unsalted popcorn and pretzels. Fat-free sweets. What foods are not recommended? The items listed may not be a complete list. Talk with your dietitian about what dietary choices are best for you. Grains Baked goods made with fat, such as croissants, muffins, or some breads. Dry pasta or rice meal packs. Vegetables Creamed or fried vegetables. Vegetables in a cheese sauce. Regular canned vegetables (not low-sodium or reduced-sodium). Regular canned tomato sauce and paste (not low-sodium or reduced-sodium). Regular tomato and vegetable juice (not low-sodium or reduced-sodium). Rosita FirePickles. Olives. Fruits Canned fruit in a light or heavy syrup. Fried fruit. Fruit in cream or butter sauce. Meat and other protein foods Fatty cuts of meat. Ribs. Fried meat. Tomasa BlaseBacon. Sausage. Bologna and other processed lunch meats. Salami. Fatback. Hotdogs. Bratwurst. Salted nuts and seeds. Canned beans with added salt. Canned or smoked fish. Whole eggs or egg yolks. Chicken or Malaw[place]urkey with skin. Dairy Whole or 2% milk, cream, and half-and-half. Whole or full-fat cream cheese. Whole-fat or sweetened yogurt. Full-fat cheese. Nondairy creamers. Whipped toppings. Processed cheese and cheese spreads. Fats and oils Butter. Stick margarine. Lard. Shortening. Ghee. Bacon fat. Tropical oils, such as coconut,  palm kernel, or palm oil. Seasoning and other foods Salted popcorn and pretzels. Onion salt, garlic salt, seasoned salt, table salt, and sea salt. Worcestershire sauce. Tartar sauce. Barbecue sauce. Teriyaki sauce. Soy sauce, including reduced-sodium. Steak sauce. Canned and packaged gravies. Fish sauce. Oyster sauce. Cocktail sauce. Horseradish that you find on the shelf. Ketchup. Mustard. Meat  flavorings and tenderizers. Bouillon cubes. Hot sauce and Tabasco sauce. Premade or packaged marinades. Premade or packaged taco seasonings. Relishes. Regular salad dressings. Where to find more information:  National Heart, Lung, and Blood Institute: PopSteam.is  American Heart Association: www.heart.org Summary  The DASH eating plan is a healthy eating plan that has been shown to reduce high blood pressure (hypertension). It may also reduce your risk for type 2 diabetes, heart disease, and stroke.  With the DASH eating plan, you should limit salt (sodium) intake to 2,300 mg a day. If you have hypertension, you may need to reduce your sodium intake to 1,500 mg a day.  When on the DASH eating plan, aim to eat more fresh fruits and vegetables, whole grains, lean proteins, low-fat dairy, and heart-healthy fats.  Work with your health care provider or diet and nutrition specialist (dietitian) to adjust your eating plan to your individual calorie needs. This information is not intended to replace advice given to you by your health care provider. Make sure you discuss any questions you have with your health care provider. Document Released: 06/11/2011 Document Revised: 06/04/2017 Document Reviewed: 06/15/2016 Elsevier Patient Education  2020 [employer].  Fall Prevention in the Home, Adult Falls can cause injuries. They can happen to people of all ages. There are many things you can do to make your home safe and to help prevent falls. Ask for help when making these changes, if needed. What actions  can I take to prevent falls? General Instructions  Use good lighting in all rooms. Replace any light bulbs that burn out.  Turn on the lights when you go into a dark area. Use night-lights.  Keep items that you use often in easy-to-reach places. Lower the shelves around your home if necessary.  Set up your furniture so you have a clear path. Avoid moving your furniture around.  Do not have throw rugs and other things on the floor that can make you trip.  Avoid walking on wet floors.  If any of your floors are uneven, fix them.  Add color or contrast paint or tape to clearly mark and help you see: ? Any grab bars or handrails. ? First and last steps of stairways. ? Where the edge of each step is.  If you use a stepladder: ? Make sure that it is fully opened. Do not climb a closed stepladder. ? Make sure that both sides of the stepladder are locked into place. ? Ask someone to hold the stepladder for you while you use it.  If there are any pets around you, be aware of where they are. What can I do in the bathroom?      Keep the floor dry. Clean up any water that spills onto the floor as soon as it happens.  Remove soap buildup in the tub or shower regularly.  Use non-skid mats or decals on the floor of the tub or shower.  Attach bath mats securely with double-sided, non-slip rug tape.  If you need to sit down in the shower, use a plastic, non-slip stool.  Install grab bars by the toilet and in the tub and shower. Do not use towel bars as grab bars. What can I do in the bedroom?  Make sure that you have a light by your bed that is easy to reach.  Do not use any sheets or blankets that are too big for your bed. They should not hang down onto the floor.  Have a firm chair  that has side arms. You can use this for support while you get dressed. What can I do in the kitchen?  Clean up any spills right away.  If you need to reach something above you, use a strong step  stool that has a grab bar.  Keep electrical cords out of the way.  Do not use floor polish or wax that makes floors slippery. If you must use wax, use non-skid floor wax. What can I do with my stairs?  Do not leave any items on the stairs.  Make sure that you have a light switch at the top of the stairs and the bottom of the stairs. If you do not have them, ask someone to add them for you.  Make sure that there are handrails on both sides of the stairs, and use them. Fix handrails that are broken or loose. Make sure that handrails are as long as the stairways.  Install non-slip stair treads on all stairs in your home.  Avoid having throw rugs at the top or bottom of the stairs. If you do have throw rugs, attach them to the floor with carpet tape.  Choose a carpet that does not hide the edge of the steps on the stairway.  Check any carpeting to make sure that it is firmly attached to the stairs. Fix any carpet that is loose or worn. What can I do on the outside of my home?  Use bright outdoor lighting.  Regularly fix the edges of walkways and driveways and fix any cracks.  Remove anything that might make you trip as you walk through a door, such as a raised step or threshold.  Trim any bushes or trees on the path to your home.  Regularly check to see if handrails are loose or broken. Make sure that both sides of any steps have handrails.  Install guardrails along the edges of any raised decks and porches.  Clear walking paths of anything that might make someone trip, such as tools or rocks.  Have any leaves, snow, or ice cleared regularly.  Use sand or salt on walking paths during winter.  Clean up any spills in your garage right away. This includes grease or oil spills. What other actions can I take?  Wear shoes that: ? Have a low heel. Do not wear high heels. ? Have rubber bottoms. ? Are comfortable and fit you well. ? Are closed at the toe. Do not wear open-toe  sandals.  Use tools that help you move around (mobility aids) if they are needed. These include: ? Canes. ? Walkers. ? Scooters. ? Crutches.  Review your medicines with your doctor. Some medicines can make you feel dizzy. This can increase your chance of falling. Ask your doctor what other things you can do to help prevent falls. Where to find more information  Centers for Disease Control and Prevention, STEADI: HealthcareCounselor.com.pthttps://cdc.gov  [employer] on Aging: RingConnections.sihttps://go4life.nia.nih.gov Contact a doctor if:  You are afraid of falling at home.  You feel weak, drowsy, or dizzy at home.  You fall at home. Summary  There are many simple things that you can do to make your home safe and to help prevent falls.  Ways to make your home safe include removing tripping hazards and installing grab bars in the bathroom.  Ask for help when making these changes in your home. This information is not intended to replace advice given to you by your health care provider. Make sure you discuss any  questions you have with your health care provider. Document Released: 04/18/2009 Document Revised: 10/13/2018 Document Reviewed: 02/04/2017 Elsevier Patient Education  2020 [employer].   Health Maintenance, Male Adopting a healthy lifestyle and getting preventive care are important in promoting health and wellness. Ask your health care provider about:  The right schedule for you to have regular tests and exams.  Things you can do on your own to prevent diseases and keep yourself healthy. What should I know about diet, weight, and exercise? Eat a healthy diet   Eat a diet that includes plenty of vegetables, fruits, low-fat dairy products, and lean protein.  Do not eat a lot of foods that are high in solid fats, added sugars, or sodium. Maintain a healthy weight Body mass index (BMI) is a measurement that can be used to identify possible weight problems. It estimates body fat based on height and  weight. Your health care provider can help determine your BMI and help you achieve or maintain a healthy weight. Get regular exercise Get regular exercise. This is one of the most important things you can do for your health. Most adults should:  Exercise for at least 150 minutes each week. The exercise should increase your heart rate and make you sweat (moderate-intensity exercise).  Do strengthening exercises at least twice a week. This is in addition to the moderate-intensity exercise.  Spend less time sitting. Even light physical activity can be beneficial. Watch cholesterol and blood lipids Have your blood tested for lipids and cholesterol at 75 years of age, then have this test every 5 years. You may need to have your cholesterol levels checked more often if:  Your lipid or cholesterol levels are high.  You are older than 75 years of age.  You are at high risk for heart disease. What should I know about cancer screening? Many types of cancers can be detected early and may often be prevented. Depending on your health history and family history, you may need to have cancer screening at various ages. This may include screening for:  Colorectal cancer.  Prostate cancer.  Skin cancer.  Lung cancer. What should I know about heart disease, diabetes, and high blood pressure? Blood pressure and heart disease  High blood pressure causes heart disease and increases the risk of stroke. This is more likely to develop in people who have high blood pressure readings, are of African descent, or are overweight.  Talk with your health care provider about your target blood pressure readings.  Have your blood pressure checked: ? Every 3-5 years if you are 17-31 years of age. ? Every year if you are 63 years old or older.  If you are between the ages of 76 and 85 and are a current or former smoker, ask your health care provider if you should have a one-time screening for abdominal aortic  aneurysm (AAA). Diabetes Have regular diabetes screenings. This checks your fasting blood sugar level. Have the screening done:  Once every three years after age 21 if you are at a normal weight and have a low risk for diabetes.  More often and at a younger age if you are overweight or have a high risk for diabetes. What should I know about preventing infection? Hepatitis B If you have a higher risk for hepatitis B, you should be screened for this virus. Talk with your health care provider to find out if you are at risk for hepatitis B infection. Hepatitis C Blood testing is recommended for:  Everyone born from [birth year] through 1967.  Anyone with known risk factors for hepatitis C. Sexually transmitted infections (STIs)  You should be screened each year for STIs, including gonorrhea and chlamydia, if: ? You are sexually active and are younger than 75 years of age. ? You are older than 75 years of age and your health care provider tells you that you are at risk for this type of infection. ? Your sexual activity has changed since you were last screened, and you are at increased risk for chlamydia or gonorrhea. Ask your health care provider if you are at risk.  Ask your health care provider about whether you are at high risk for HIV. Your health care provider may recommend a prescription medicine to help prevent HIV infection. If you choose to take medicine to prevent HIV, you should first get tested for HIV. You should then be tested every 3 months for as long as you are taking the medicine. Follow these instructions at home: Lifestyle  Do not use any products that contain nicotine or tobacco, such as cigarettes, e-cigarettes, and chewing tobacco. If you need help quitting, ask your health care provider.  Do not use street drugs.  Do not share needles.  Ask your health care provider for help if you need support or information about quitting drugs. Alcohol use  Do not drink alcohol if  your health care provider tells you not to drink.  If you drink alcohol: ? Limit how much you have to 0-2 drinks a day. ? Be aware of how much alcohol is in your drink. In the [place] one drink equals one 12 oz bottle of beer (355 mL), one 5 oz glass of wine (148 mL), or one 1 oz glass of hard liquor (44 mL). General instructions  Schedule regular health, dental, and eye exams.  Stay current with your vaccines.  Tell your health care provider if: ? You often feel depressed. ? You have ever been abused or do not feel safe at home. Summary  Adopting a healthy lifestyle and getting preventive care are important in promoting health and wellness.  Follow your health care provider's instructions about healthy diet, exercising, and getting tested or screened for diseases.  Follow your health care provider's instructions on monitoring your cholesterol and blood pressure. This information is not intended to replace advice given to you by your health care provider. Make sure you discuss any questions you have with your health care provider. Document Released: 12/19/2007 Document Revised: 06/15/2018 Document Reviewed: 06/15/2018 Elsevier Patient Education  2020 [employer].

## 2019-02-16 NOTE — Progress Notes (Signed)
This AWV is being conducted in-person at [facility]  Subjective:   [name] is a 73 y.o. male who presents for a Medicare Annual Wellness Visit.  The following items have been reviewed and updated today in the appropriate area in the EMR.   Health Risk Assessment  Height, weight, BMI, and BP Visual acuity if needed Depression screen Fall risk / safety level Advance directive discussion Medical and family history were reviewed and updated Updating list of other providers & suppliers Medication reconciliation, including over the counter medicines Cognitive screen Written screening schedule Risk Factor list Personalized health advice, risky behaviors, and treatment advice  Social History   Social History Narrative   Current Social History 02/16/2019        Patient lives with family (baby's mama, daughter and 4 grandkids:  Two yo twins, 50 yo and 19 yo in a home which is 1 story. There are 4 steps with handrails up to the entrance the patient uses.       Patient's method of transportation is via family member (baby's mama).      The highest level of education was high school diploma.      The patient currently retired.      Identified important Relationships are "My Baby's [name], [name]."       Pets : None       Interests / Fun: Sitting outdoors watching the birds and planes."       Exercise riding a bike 5 miles 3 days/week      Current Stressors: The grandkids       Religious / Personal Beliefs: "I believe in God."       L. Erlin Gardella, RN, BSN            Objective:    Vitals: BP 114/69 (BP Location: Left Arm, Patient Position: Sitting, Cuff Size: Normal)   Pulse 74   Temp 98.1 F (36.7 C) (Oral)   Ht 5\' 8"  (1.727 m)   Wt 178 lb (80.7 kg)   SpO2 100%   BMI 27.06 kg/m  Vitals are [facility] performed  Activities of Daily Living In your present state of health, do you have any difficulty performing the following activities: 02/16/2019 03/17/2018  Hearing? N N   Vision? N N  Difficulty concentrating or making decisions? Y Y  Comment "I forget a lot of things but then I think and can remember most of it." -  Walking or climbing stairs? N Y  Dressing or bathing? N N  Doing errands, shopping? N N  Some recent data might be hidden    Goals Goals    . Think about riding bike daily (pt-stated)     Currently riding bicycle 5 miles 3 X week       Fall Risk Fall Risk  02/16/2019 03/17/2018 02/17/2018 01/26/2018 12/09/2017  Falls in the past year? 0 No No No No  Follow up Falls prevention discussed;Education provided - - - -   Falls prevention discussed and CDC STEADI pamhlet given.   Depression Screen PHQ 2/9 Scores 02/16/2019 03/17/2018 02/17/2018 01/26/2018  PHQ - 2 Score 0 0 0 0  PHQ- 9 Score 3 - - -     Cognitive Testing Six-Item Cognitive Screener   "I would like to ask you some questions that ask you to use your memory. I am going to name three objects. Please wait until I say all three words, then repeat them. Remember what they are  because I  am going to ask you to name them again in a few minutes. Please repeat these words for me: APPLE-TABLE-PENNY." (Interviewer may repeat names 3 times if necessary but repetition not scored.)  Did patient correctly repeat all three words? Yes - may proceed with screen  What year is this? Correct What month is this? Correct What day of the week is this? Correct  What were the three objects I asked you to remember? . Apple Correct . Table Correct . Penny Correct  Score one point for each incorrect answer.  A score of 2 or more points warrants additional investigation.  Patient's score 0     Assessment and Plan:     Patient is currently taking coumadin 5 mg tablet daily. Instructions were to take daily except take 1/2 tab on Tuesdays. Will clarify with Dr. Elie Confer. PCP is aware. Patient's favorite food is Oodles of Noodles. Discussed high salt content of this product and discussed DASH eating plan.  Offered appt with In-House Dietician to discuss healthy food choices. Patient not interested at this time.  During the course of the visit the patient was educated and counseled about appropriate screening and preventive services as documented in the assessment and plan.  The printed AVS was given to the patient and included an updated screening schedule, a list of risk factors, and personalized health advice.        Velora Heckler, RN  02/16/2019

## 2019-02-17 LAB — PSA: Prostate Specific Ag, Serum: 0.5 ng/mL (ref 0.0–4.0)

## 2019-02-20 ENCOUNTER — Ambulatory Visit: Payer: Medicare Other

## 2019-02-20 DIAGNOSIS — B356 Tinea cruris: Secondary | ICD-10-CM | POA: Insufficient documentation

## 2019-02-20 NOTE — Assessment & Plan Note (Signed)
Patient presented today for annual wellness visit.  Plaint of itching and rash in his groin that was concerning for tinea cruris.   Plan: -Prescribed terbinafine 1% cream -Counseled patient on keeping area clean and dry and frequently changing his undergarments.

## 2019-02-21 ENCOUNTER — Other Ambulatory Visit (HOSPITAL_COMMUNITY): Payer: Self-pay | Admitting: Internal Medicine

## 2019-02-23 NOTE — Progress Notes (Signed)
Internal Medicine Clinic Attending  Case discussed with Dr. Coe at the time of the visit.  We reviewed the resident's history and exam and pertinent patient test results.  I agree with the assessment, diagnosis, and plan of care documented in the resident's note.    

## 2019-03-02 ENCOUNTER — Encounter: Payer: Self-pay | Admitting: Internal Medicine

## 2019-03-02 ENCOUNTER — Other Ambulatory Visit: Payer: Self-pay

## 2019-03-02 ENCOUNTER — Ambulatory Visit (INDEPENDENT_AMBULATORY_CARE_PROVIDER_SITE_OTHER): Payer: Medicare Other | Admitting: Internal Medicine

## 2019-03-02 VITALS — BP 104/73 | HR 79 | Temp 97.8°F | Ht 68.0 in | Wt 179.0 lb

## 2019-03-02 DIAGNOSIS — R351 Nocturia: Secondary | ICD-10-CM

## 2019-03-02 DIAGNOSIS — N182 Chronic kidney disease, stage 2 (mild): Secondary | ICD-10-CM

## 2019-03-02 DIAGNOSIS — R6889 Other general symptoms and signs: Secondary | ICD-10-CM | POA: Diagnosis not present

## 2019-03-02 DIAGNOSIS — Z20822 Contact with and (suspected) exposure to covid-19: Secondary | ICD-10-CM

## 2019-03-02 DIAGNOSIS — Z79899 Other long term (current) drug therapy: Secondary | ICD-10-CM

## 2019-03-02 DIAGNOSIS — N401 Enlarged prostate with lower urinary tract symptoms: Secondary | ICD-10-CM

## 2019-03-02 DIAGNOSIS — Z87891 Personal history of nicotine dependence: Secondary | ICD-10-CM

## 2019-03-02 DIAGNOSIS — R42 Dizziness and giddiness: Secondary | ICD-10-CM

## 2019-03-02 DIAGNOSIS — R7989 Other specified abnormal findings of blood chemistry: Secondary | ICD-10-CM | POA: Diagnosis not present

## 2019-03-02 DIAGNOSIS — I10 Essential (primary) hypertension: Secondary | ICD-10-CM

## 2019-03-02 MED ORDER — TAMSULOSIN HCL 0.4 MG PO CAPS: 0.4000 mg | ORAL_CAPSULE | Freq: Every day | ORAL | 3 refills | Status: DC

## 2019-03-02 MED ORDER — FUROSEMIDE 20 MG PO TABS: 20.0000 mg | ORAL_TABLET | Freq: Every day | ORAL | 3 refills | Status: DC

## 2019-03-02 MED ORDER — TAMSULOSIN HCL 0.4 MG PO CAPS: 0.4000 mg | ORAL_CAPSULE | Freq: Every day | ORAL | Status: DC

## 2019-03-02 MED ORDER — LISINOPRIL 5 MG PO TABS: 5.0000 mg | ORAL_TABLET | Freq: Every day | ORAL | 3 refills | Status: DC

## 2019-03-02 NOTE — Assessment & Plan Note (Signed)
Patient currently taking finasteride 5 mg daily for benign prostatic hyperplasia.  States that he currently has signs and symptoms of orthostatic hypotension, which is a known side effect of finasteride.  Patient continues to have nocturia. He states that he needs to void 2-3+ times a night.  He admits to having to use the bathroom every 1-2 hours.  He denies abdominal pain, CVA tenderness, dribbling, or incontinence.  Plan: - I will switch his medication to tamsulosin 0.4 mg daily. - I will follow-up with the patient in 1 month to see if his symptoms are improving.  He may need a urology referral at that time for further evaluation and management of his BPH.

## 2019-03-02 NOTE — Patient Instructions (Signed)
Thank you, [name] for allowing [place] to provide your care today. Today we discussed hypertension and benign prostatic hyperplasia.    I have ordered basic metabolic panel and complete blood count labs for you. I will call if any are abnormal.    I have place a referrals to none at this time but if you continue to have dizziness we will talk about a referral to urology.   I have ordered the following tests: None  I have ordered the following medication/changed the following medications: Please stop taking the finasteride.  Please start taking the Flomax every day 30 minutes after breakfast. Please only take one 5 mg pill of lisinopril each day  Please follow-up in 1 month.    Should you have any questions or concerns please call the internal medicine clinic at [phone].    [name], D.O. [facility]

## 2019-03-02 NOTE — Assessment & Plan Note (Addendum)
Patient's blood pressure is 104/73.  He admits to episodes of dizziness when bending over.  He denies headache, loss of consciousness, lightheadedness, blurry vision, or sense of the room spinning around him.   Plan: - Performed orthostatics today.  Negative for orthostatic hypotension. - Patient's blood pressure has been running marginally low over the last couple months.  I will decrease his lisinopril to 5 mg once daily. - I will bring him back in 1 month to reevaluate.

## 2019-03-02 NOTE — Progress Notes (Signed)
   CC: Hypertension  HPI:  [name] is a 73 y.o. male with a past medical history stated below and presents today for pretension. Please see problem based assessment and plan for additional details.    Past Medical History:  Diagnosis Date  . Atrial fibrillation ([place])    post op.  s/p dc-cv 04/2008. previously on amiodarone  . Atrial flutter ([place])    atypical  . AV block, 1st degree    .450 msec--progressive  . 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  a- Echo 07/2008 Ef 55% mild peroprosthetic MVR with High transmitral gradient (mean 14).  b- normal coronaries by cath 12/2007  . BPH (benign prostatic hyperplasia)   . CHF (congestive heart failure) (HCC)    EF 35-40 % 2011 due to valvular disease and diastolic dysfunction  . Chronic back pain   . CKD (chronic kidney disease), stage III ([place])   . Dysphagia    with normal barium swallow 09/2008  . Endocarditis   . GERD (gastroesophageal reflux disease)   . Heavy alcohol use    history  . History of GI bleed   . Hypertension   . IV drug abuse ([place])    history of  . Lower GI bleed 01/2016  . Pacemaker 2010      Review of Systems: Review of Systems  Constitutional: Negative for chills, fever and malaise/fatigue.  Eyes: Negative for blurred vision, double vision and photophobia.  Respiratory: Negative for cough and shortness of breath.   Cardiovascular: Negative for chest pain, orthopnea, claudication and leg swelling.  Gastrointestinal: Negative for blood in stool and nausea.  Neurological: Positive for dizziness. Negative for tremors, sensory change, focal weakness, seizures, loss of consciousness, weakness and headaches.     Vitals:   03/02/19 1026  BP: 104/73  Pulse: 79  Temp: 97.8 F (36.6 C)  TempSrc: Oral  SpO2: 100%  Weight: 179 lb (81.2 kg)  Height: 5\' 8"  (1.727 m)     Physical Exam: Physical Exam  Constitutional: He is oriented to person, place, and time  and well-developed, well-nourished, and in no distress.  Cardiovascular: Normal rate, regular rhythm, normal heart sounds and intact distal pulses. Exam reveals no gallop and no friction rub.  No murmur heard. Pulmonary/Chest: Effort normal and breath sounds normal.  Abdominal: Soft. There is no abdominal tenderness.  Musculoskeletal: Normal range of motion.  Neurological: He is alert and oriented to person, place, and time.  Skin: Skin is warm and dry. No erythema.     Assessment & Plan:   See Encounters Tab for problem based charting.  Patient seen with Dr. Dareen Piano

## 2019-03-03 LAB — BMP8+ANION GAP
Anion Gap: 17 mmol/L (ref 10.0–18.0)
BUN/Creatinine Ratio: 15 (ref 10–24)
BUN: 26 mg/dL (ref 8–27)
CO2: 19 mmol/L — ABNORMAL LOW (ref 20–29)
Calcium: 9.6 mg/dL (ref 8.6–10.2)
Chloride: 101 mmol/L (ref 96–106)
Creatinine, Ser: 1.77 mg/dL — ABNORMAL HIGH (ref 0.76–1.27)
GFR calc Af Amer: 42 mL/min/{1.73_m2} — ABNORMAL LOW (ref >59)
GFR calc non Af Amer: 37 mL/min/{1.73_m2} — ABNORMAL LOW (ref >59)
Glucose: 86 mg/dL (ref 65–99)
Potassium: 5.2 mmol/L (ref 3.5–5.2)
Sodium: 137 mmol/L (ref 134–144)

## 2019-03-03 LAB — CBC
Hematocrit: 43.3 % (ref 37.5–51.0)
Hemoglobin: 14.2 g/dL (ref 13.0–17.7)
MCH: 29.1 pg (ref 26.6–33.0)
MCHC: 32.8 g/dL (ref 31.5–35.7)
MCV: 89 fL (ref 79–97)
Platelets: 297 10*3/uL (ref 150–450)
RBC: 4.88 x10E6/uL (ref 4.14–5.80)
RDW: 14.1 % (ref 11.6–15.4)
WBC: 4.2 10*3/uL (ref 3.4–10.8)

## 2019-03-04 LAB — NOVEL CORONAVIRUS, NAA: SARS-CoV-2, NAA: NOT DETECTED

## 2019-03-05 NOTE — Progress Notes (Signed)
Internal Medicine Clinic Attending  I saw and evaluated the patient.  I personally confirmed the key portions of the history and exam documented by Dr. [name] and I reviewed pertinent patient test results.  The assessment, diagnosis, and plan were formulated together and I agree with the documentation in the resident's note.      Patient has mildly worsening creatinine compared to his baseline. Will need a repeat BMP on follow up. Would also consider checking a PSA on his follow up visit given his worsening urinary symptoms despite being on finasteride. May need urology referral if PSA is elevated.

## 2019-03-06 ENCOUNTER — Telehealth: Payer: Self-pay | Admitting: Internal Medicine

## 2019-03-06 MED ORDER — SPIRONOLACTONE 25 MG PO TABS: 12.5000 mg | ORAL_TABLET | Freq: Every day | ORAL | 0 refills | Status: DC

## 2019-03-06 NOTE — Progress Notes (Signed)
Patient was called and notified of his lab result.  The increase in creatinine is likely due to the anti-aldosterone effects of spironolactone.  I counseled the patient to decrease his dose of spironolactone to 1/2 tablet every day. He will follow-up in the clinic on 9/24.  Patient expressed understanding.

## 2019-03-06 NOTE — Telephone Encounter (Signed)
Spoke directly with the patient regarding his increase in creatinine from 1.4 to 1.77.  This is likely due to his spironolactone medication.  I instructed him to decrease his medication to 1/2 tablet daily.  He will follow-up with our clinic on 9/24.

## 2019-03-06 NOTE — Addendum Note (Signed)
Addended by: Lawerance Cruel on: 03/06/2019 11:26 AM   Modules accepted: Orders

## 2019-03-23 ENCOUNTER — Other Ambulatory Visit (HOSPITAL_COMMUNITY): Payer: Self-pay | Admitting: Internal Medicine

## 2019-03-28 ENCOUNTER — Telehealth: Payer: Self-pay | Admitting: *Deleted

## 2019-03-28 NOTE — Telephone Encounter (Signed)
Pt's sig other calls and states pt has 2 large bruises, one on each hip, states "they are big" pt denies knowledge of how they came to be. She is concerned because he is on blood thinners, appt 9/23 at 09[facility]

## 2019-03-29 ENCOUNTER — Ambulatory Visit (INDEPENDENT_AMBULATORY_CARE_PROVIDER_SITE_OTHER): Payer: Medicare Other | Admitting: Internal Medicine

## 2019-03-29 ENCOUNTER — Other Ambulatory Visit: Payer: Self-pay

## 2019-03-29 VITALS — BP 125/83 | HR 76 | Temp 98.1°F | Ht 68.0 in | Wt 182.2 lb

## 2019-03-29 DIAGNOSIS — T148XXA Other injury of unspecified body region, initial encounter: Secondary | ICD-10-CM | POA: Diagnosis not present

## 2019-03-29 DIAGNOSIS — S99922A Unspecified injury of left foot, initial encounter: Secondary | ICD-10-CM

## 2019-03-29 DIAGNOSIS — N401 Enlarged prostate with lower urinary tract symptoms: Secondary | ICD-10-CM | POA: Diagnosis not present

## 2019-03-29 DIAGNOSIS — R351 Nocturia: Secondary | ICD-10-CM

## 2019-03-29 LAB — CBC
HCT: 41.7 % (ref 39.0–52.0)
Hemoglobin: 13.5 g/dL (ref 13.0–17.0)
MCH: 30.3 pg (ref 26.0–34.0)
MCHC: 32.4 g/dL (ref 30.0–36.0)
MCV: 93.7 fL (ref 80.0–100.0)
Platelets: 305 10*3/uL (ref 150–400)
RBC: 4.45 MIL/uL (ref 4.22–5.81)
RDW: 15.3 % (ref 11.5–15.5)
WBC: 5.4 10*3/uL (ref 4.0–10.5)
nRBC: 0 % (ref 0.0–0.2)

## 2019-03-29 LAB — PROTIME-INR
INR: 4.9 (ref 0.8–1.2)
Prothrombin Time: 45.2 seconds — ABNORMAL HIGH (ref 11.4–15.2)

## 2019-03-29 LAB — POCT INR: INR: 6.3 — AB (ref 2.0–3.0)

## 2019-03-29 MED ORDER — TAMSULOSIN HCL 0.4 MG PO CAPS: 0.8000 mg | ORAL_CAPSULE | Freq: Every day | ORAL | 0 refills | Status: DC

## 2019-03-29 NOTE — Progress Notes (Signed)
CC: hematoma, BPH  HPI:  [name] is a 73 y.o. male with PMH below.  Today we will address hematoma, BPH  Please see A&P for status of the patient's chronic medical conditions  Past Medical History:  Diagnosis Date  . Atrial fibrillation ([place])    post op.  s/p dc-cv 04/2008. previously on amiodarone  . Atrial flutter ([place])    atypical  . AV block, 1st degree    .450 msec--progressive  . 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  a- Echo 07/2008 Ef 55% mild peroprosthetic MVR with High transmitral gradient (mean 14).  b- normal coronaries by cath 12/2007  . BPH (benign prostatic hyperplasia)   . CHF (congestive heart failure) (HCC)    EF 35-40 % 2011 due to valvular disease and diastolic dysfunction  . Chronic back pain   . CKD (chronic kidney disease), stage III ([place])   . Dysphagia    with normal barium swallow 09/2008  . Endocarditis   . GERD (gastroesophageal reflux disease)   . Heavy alcohol use    history  . History of GI bleed   . Hypertension   . IV drug abuse ([place])    history of  . Lower GI bleed 01/2016  . Pacemaker 2010   Review of Systems:  ROS: Pulmonary: pt denies increased work of breathing, shortness of breath,  Cardiac: pt denies palpitations, chest pain,  Abdominal: pt denies abdominal pain, nausea, vomiting, or diarrhea  Physical Exam:  Vitals:   03/29/19 0952  BP: 125/83  Pulse: 76  Temp: 98.1 F (36.7 C)  SpO2: 100%  Weight: 182 lb 3.2 oz (82.6 kg)  Height: 5\' 8"  (1.727 m)   Cardiac: JVD flat, normal rate and rhythm, mechanical valve click, no murmurs, rubs or gallops Pulmonary: CTAB, not in distress SKIN: hematoma on left hip Psych: Alert, conversant, in good spirits   Social History   Socioeconomic History  . Marital status: Divorced    Spouse name: Not on file  . Number of children: 62  . Years of education: Not on file  . Highest education level: Not on file  Occupational History  .  Occupation: retired    Comment: Clinical cytogeneticist  Social Needs  . Financial resource strain: Not on file  . Food insecurity    Worry: Not on file    Inability: Not on file  . Transportation needs    Medical: Not on file    Non-medical: Not on file  Tobacco Use  . Smoking status: Former Smoker    Years: 0.00    Types: Cigarettes  . Smokeless tobacco: Never Used  Substance and Sexual Activity  . Alcohol use: No    Alcohol/week: 0.0 standard drinks  . Drug use: No  . Sexual activity: Not Currently  Lifestyle  . Physical activity    Days per week: Not on file    Minutes per session: Not on file  . Stress: Not on file  Relationships  . Social Herbalist on phone: Not on file    Gets together: Not on file    Attends religious service: Not on file    Active member of club or organization: Not on file    Attends meetings of clubs or organizations: Not on file    Relationship status: Not on file  . Intimate partner violence    Fear of current or ex partner: Not on file  Emotionally abused: Not on file    Physically abused: Not on file    Forced sexual activity: Not on file  Other Topics Concern  . Not on file  Social History Narrative   Current Social History 02/16/2019        Patient lives with family (baby's mama, daughter and 4 grandkids:  Two yo twins, 39 yo and 50 yo in a home which is 1 story. There are 4 steps with handrails up to the entrance the patient uses.       Patient's method of transportation is via family member (baby's mama).      The highest level of education was high school diploma.      The patient currently retired.      Identified important Relationships are "My Baby's [name], [name]."       Pets : None       Interests / Fun: Sitting outdoors watching the birds and planes."       Exercise riding a bike 5 miles 3 days/week      Current Stressors: The grandkids       Religious / Personal Beliefs: "I believe in God."       L. [place],  RN, BSN       Family History  Problem Relation Age of Onset  . Coronary artery disease Mother   . Cancer Father        GI  . Cancer Brother        Lung    Assessment & Plan:   See Encounters Tab for problem based charting.  Patient discussed with Dr. Rogelia Boga

## 2019-03-29 NOTE — Patient Instructions (Signed)
[name] we will stop your warfarin for the next few days and you will need to come back Friday morning so we can recheck your INR and adjust your dosage.  I have also increased your tamsulosin flomax for your urinary symptoms.  Please return in one month to follow up on how that is going.

## 2019-03-29 NOTE — Assessment & Plan Note (Addendum)
Pt with little to no improvement in symptoms.  Last visit finasteride D/Ced and pt switched to flomax 0.4mg .    -increase flomax to 0.8mg  if still significant symptoms will need urology referral

## 2019-03-29 NOTE — Assessment & Plan Note (Signed)
Patient hit the doorknob when getting up during the middle the night to use the restroom.  Hematoma present on left hip.  Given size and bluntness of object checked an INR and CBC today.  INR is supratherapeutic at 4.9.  Hemoglobin is stable.    -He was instructed to hold warfarin we will repeat a point-of-care test on Friday morning -He missed his appointment with Dr. Elie Confer last month I will have him reestablish with the coumadin clinic.  He will try to call  Dr. Elie Confer and link up on Friday.

## 2019-03-29 NOTE — Telephone Encounter (Signed)
diclofenac sodium (VOLTAREN) 1 % GEL, is not at the pharmacy. Please call pt back.

## 2019-03-30 ENCOUNTER — Other Ambulatory Visit: Payer: Self-pay | Admitting: Internal Medicine

## 2019-03-30 ENCOUNTER — Ambulatory Visit: Payer: Medicare Other

## 2019-03-30 DIAGNOSIS — N401 Enlarged prostate with lower urinary tract symptoms: Secondary | ICD-10-CM

## 2019-03-31 ENCOUNTER — Ambulatory Visit (INDEPENDENT_AMBULATORY_CARE_PROVIDER_SITE_OTHER): Payer: Medicare Other | Admitting: Pharmacist

## 2019-03-31 DIAGNOSIS — T148XXA Other injury of unspecified body region, initial encounter: Secondary | ICD-10-CM

## 2019-03-31 DIAGNOSIS — Z7901 Long term (current) use of anticoagulants: Secondary | ICD-10-CM

## 2019-03-31 DIAGNOSIS — Z952 Presence of prosthetic heart valve: Secondary | ICD-10-CM

## 2019-03-31 DIAGNOSIS — Z5181 Encounter for therapeutic drug level monitoring: Secondary | ICD-10-CM | POA: Diagnosis not present

## 2019-03-31 DIAGNOSIS — I499 Cardiac arrhythmia, unspecified: Secondary | ICD-10-CM

## 2019-03-31 LAB — POCT INR: INR: 4 — AB (ref 2.0–3.0)

## 2019-03-31 NOTE — Progress Notes (Signed)
INTERNAL MEDICINE TEACHING ATTENDING ADDENDUM ° °I agree with pharmacy recommendations as outlined in their note.  ° °-Gunther Zawadzki MD ° °

## 2019-03-31 NOTE — Patient Instructions (Signed)
Patient instructed to take medications as defined in the Anti-coagulation Track section of this encounter.  Patient instructed to OMIT today's dose.  Patient instructed to recommence warfarin on Saturday, September 26th, 2020 as follows:  Take one (1) tablet daily of your 5mg  peach-colored warfarin tablets at 6PM each day--EXCEPT on MONDAYS, WEDNESDAYS, FRIDAYS and SATURDAYS, take ONLY one-half (1/2) of your 5mg  peach-colored warfarin tablets on these days.  Patient verbalized understanding of these instructions.

## 2019-03-31 NOTE — Progress Notes (Signed)
Anticoagulation Management [name] is a 73 y.o. male who reports to the clinic for monitoring of warfarin treatment.    Indication: H/O mitral valve replacement with mechanical valve; atrial flutter (resolved); long term (current) use of anticoagulant.    Duration: indefinite Supervising physician: Erlinda Hong  Anticoagulation Clinic Visit History: Patient does report signs/symptoms of bleeding or thromboembolism (see Dr. Starr Sinclair note of 29-Mar-2019) Other recent changes: No diet, medications, lifestyle endorsed.  Anticoagulation Episode Summary    Current INR goal:  2.5-3.5  TTR:  61.7 % (6.5 y)  Next INR check:  04/10/2019  INR from last check:  4.9 (03/29/2019)  Weekly max warfarin dose:    Target end date:  Indefinite  INR check location:  Anticoagulation Clinic  Preferred lab:    Send INR reminders to:  [facility] [address]   Indications   IRREGULAR PULSE [I49.9] Long term (current) use of anticoagulants (Resolved) [Z79.01] ATRIAL FLUTTER (Resolved) [I48.92] H/O mitral valve replacement with mechanical valve [Z95.2]       Comments:        Anticoagulation Care Providers    Provider Role Specialty Phone number   [name], MD  Cardiology [phone]      No Known Allergies  Current Outpatient Medications:  .  carvedilol (COREG) 6.25 MG tablet, TAKE 1 TABLET (6.25 MG TOTAL) BY MOUTH 2 (TWO) TIMES DAILY WITH A MEAL., Disp: 180 tablet, Rfl: 1 .  diclofenac sodium (VOLTAREN) 1 % GEL, Apply 2 g topically 4 (four) times daily., Disp: 1 Tube, Rfl: 1 .  furosemide (LASIX) 20 MG tablet, Take 1 tablet (20 mg total) by mouth daily., Disp: 90 tablet, Rfl: 3 .  KLOR-CON M20 20 MEQ tablet, TAKE 1 TABLET BY MOUTH EVERY DAY, Disp: 90 tablet, Rfl: 3 .  lisinopril (ZESTRIL) 5 MG tablet, Take 1 tablet (5 mg total) by mouth daily. Please make follow up appt at office [phone], Disp: 30 tablet, Rfl: 3 .  spironolactone (ALDACTONE) 25 MG tablet, Take 0.5  tablets (12.5 mg total) by mouth daily for 30 doses. Patient was previously taking spironolactone 25 mg 1 tablet daily.  I instructed him to change to half a tablet daily until his appointment on March 30, 2019., Disp: 15 tablet, Rfl: 0 .  tamsulosin (FLOMAX) 0.4 MG CAPS capsule, Take 2 capsules (0.8 mg total) by mouth daily after breakfast., Disp: 60 capsule, Rfl: 0 .  warfarin (COUMADIN) 5 MG tablet, 1 TABLET BY MOUTH DAILY AT 6PM ALL DAYS OF WEEK EXCEPT TUES ONLY TAKE 1/2 TABLET (Patient taking differently: Take 5 mg by mouth one time only at 6 PM. 1 TABLET BY MOUTH DAILY AT 6PM ALL DAYS OF WEEK EXCEPT TUES ONLY TAKE 1/2 TABLET), Disp: 72 tablet, Rfl: 1 .  aspirin EC 81 MG tablet, Take 1 tablet (81 mg total) by mouth daily. (Patient not taking: Reported on 03/31/2019), Disp: 90 tablet, Rfl: 3 Past Medical History:  Diagnosis Date  . Atrial fibrillation (HCC)    post op.  s/p dc-cv 04/2008. previously on amiodarone  . Atrial flutter (HCC)    atypical  . AV block, 1st degree    .450 msec--progressive  . 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  a- Echo 07/2008 Ef 55% mild peroprosthetic MVR with High transmitral gradient (mean 14).  b- normal coronaries by cath 12/2007  . BPH (benign prostatic hyperplasia)   . CHF (congestive heart failure) (HCC)    EF  35-40 % 2011 due to valvular disease and diastolic dysfunction  . Chronic back pain   . CKD (chronic kidney disease), stage III ([place])   . Dysphagia    with normal barium swallow 09/2008  . Endocarditis   . GERD (gastroesophageal reflux disease)   . Heavy alcohol use    history  . History of GI bleed   . Hypertension   . IV drug abuse ([place])    history of  . Lower GI bleed 01/2016  . Pacemaker 2010   Social History   Socioeconomic History  . Marital status: Divorced    Spouse name: Not on file  . Number of children: 34  . Years of education: Not on file  . Highest education level: Not on file   Occupational History  . Occupation: retired    Comment: Clinical cytogeneticist  Social Needs  . Financial resource strain: Not on file  . Food insecurity    Worry: Not on file    Inability: Not on file  . Transportation needs    Medical: Not on file    Non-medical: Not on file  Tobacco Use  . Smoking status: Former Smoker    Years: 0.00    Types: Cigarettes  . Smokeless tobacco: Never Used  Substance and Sexual Activity  . Alcohol use: No    Alcohol/week: 0.0 standard drinks  . Drug use: No  . Sexual activity: Not Currently  Lifestyle  . Physical activity    Days per week: Not on file    Minutes per session: Not on file  . Stress: Not on file  Relationships  . Social Herbalist on phone: Not on file    Gets together: Not on file    Attends religious service: Not on file    Active member of club or organization: Not on file    Attends meetings of clubs or organizations: Not on file    Relationship status: Not on file  Other Topics Concern  . Not on file  Social History Narrative   Current Social History 02/16/2019        Patient lives with family (baby's 110, daughter and 4 grandkids:  70 yo twins, 1 yo and 108 yo in a home which is 1 story. There are 4 steps with handrails up to the entrance the patient uses.       Patient's method of transportation is via family member (baby's mama).      The highest level of education was high school diploma.      The patient currently retired.      Identified important Relationships are "My 25 Joselyn Arrow, [name]."       Pets : None       Interests / Fun: Sitting outdoors watching the birds and planes."       Exercise riding a bike 5 miles 3 days/week      Current Stressors: The grandkids       Religious / Personal Beliefs: "I believe in God."       L. Ducatte, RN, BSN       Family History  Problem Relation Age of Onset  . Coronary artery disease Mother   . Cancer Father        GI  . Cancer Brother        Lung     ASSESSMENT Recent Results: The most recent result is correlated with having omitted warfarin dosing for 2 consecutive days. Prior to these  doses being omitted, the patient was on 32.5 mg per week: Lab Results  Component Value Date   INR 4.0 (A) 03/31/2019   INR 4.9 (HH) 03/29/2019   INR 6.3 (A) 03/29/2019    Anticoagulation Dosing: Description   Take one (1) tablet daily of your 5mg  peach-colored warfarin tablets at 6PM each day--EXCEPT on MONDAYS, WEDNESDAYS, FRIDAYS and SATURDAYS, take ONLY one-half (1/2) of your 5mg  peach-colored warfarin tablets on these days.      INR today: Supratherapeutic  PLAN Weekly dose was omitted for a third day (today) and resumed tomorrow, 01-Apr-2019 with a total weekly dose of 25mg /wk.   Patient Instructions  Patient instructed to take medications as defined in the Anti-coagulation Track section of this encounter.  Patient instructed to OMIT today's dose.  Patient instructed to recommence warfarin on Saturday, September 26th, 2020 as follows:  Take one (1) tablet daily of your 5mg  peach-colored warfarin tablets at 6PM each day--EXCEPT on MONDAYS, WEDNESDAYS, FRIDAYS and SATURDAYS, take ONLY one-half (1/2) of your 5mg  peach-colored warfarin tablets on these days.  Patient verbalized understanding of these instructions.     Patient advised to contact clinic or seek medical attention if signs/symptoms of bleeding or thromboembolism occur.  Patient verbalized understanding by repeating back information and was advised to contact me if further medication-related questions arise. Patient was also provided an information handout.  Follow-up Return in 10 days (on 04/10/2019) for Follow up INR.  , PharmD, CPP 15 minutes spent face-to-face with the patient during the encounter. 50% of time spent on education, including signs/sx bleeding and clotting, as well as food and drug interactions with warfarin. 50% of time was spent on fingerprick  POC INR sample collection,processing, results determination, and documentation in September 28th, 2020.

## 2019-04-03 MED ORDER — DICLOFENAC SODIUM 1 % TD GEL: 2.0000 g | Freq: Four times a day (QID) | TRANSDERMAL | 1 refills | Status: DC

## 2019-04-03 NOTE — Assessment & Plan Note (Signed)
Patient will need BMP at his next appointment due to worsening Cr. Despite Finasteride treatment.

## 2019-04-03 NOTE — Progress Notes (Signed)
Internal Medicine Clinic Attending  Case discussed with Dr. Winfrey  at the time of the visit.  We reviewed the resident's history and exam and pertinent patient test results.  I agree with the assessment, diagnosis, and plan of care documented in the resident's note.  

## 2019-04-10 ENCOUNTER — Other Ambulatory Visit: Payer: Self-pay

## 2019-04-10 ENCOUNTER — Ambulatory Visit (INDEPENDENT_AMBULATORY_CARE_PROVIDER_SITE_OTHER): Payer: Medicare Other | Admitting: Pharmacist

## 2019-04-10 DIAGNOSIS — Z952 Presence of prosthetic heart valve: Secondary | ICD-10-CM | POA: Diagnosis not present

## 2019-04-10 DIAGNOSIS — I4892 Unspecified atrial flutter: Secondary | ICD-10-CM

## 2019-04-10 DIAGNOSIS — I499 Cardiac arrhythmia, unspecified: Secondary | ICD-10-CM | POA: Diagnosis not present

## 2019-04-10 DIAGNOSIS — Z7901 Long term (current) use of anticoagulants: Secondary | ICD-10-CM | POA: Diagnosis not present

## 2019-04-10 DIAGNOSIS — Z5181 Encounter for therapeutic drug level monitoring: Secondary | ICD-10-CM

## 2019-04-10 LAB — POCT INR: INR: 1.8 — AB (ref 2.0–3.0)

## 2019-04-10 NOTE — Progress Notes (Signed)
Anticoagulation Management [name] is a 73 y.o. male who reports to the clinic for monitoring of warfarin treatment.    Indication: H/O mitral valve replacement with mechanical valve; Long term (current) use of anticoagulants.    Duration: indefinite Supervising physician: Earl Lagos  Anticoagulation Clinic Visit History: Patient does not report signs/symptoms of bleeding or thromboembolism  Other recent changes: No diet, medications, lifestyle changes endorsed by the patient at this visit.  Anticoagulation Episode Summary    Current INR goal:  2.5-3.5  TTR:  61.7 % (6.6 y)  Next INR check:  04/24/2019  INR from last check:  1.8 (04/10/2019)  Weekly max warfarin dose:    Target end date:  Indefinite  INR check location:  Anticoagulation Clinic  Preferred lab:    Send INR reminders to:  [facility] [address]   Indications   IRREGULAR PULSE [I49.9] Long term (current) use of anticoagulants (Resolved) [Z79.01] ATRIAL FLUTTER (Resolved) [I48.92] H/O mitral valve replacement with mechanical valve [Z95.2]       Comments:        Anticoagulation Care Providers    Provider Role Specialty Phone number   [name], MD  Cardiology [phone]      No Known Allergies  Current Outpatient Medications:  .  carvedilol (COREG) 6.25 MG tablet, TAKE 1 TABLET (6.25 MG TOTAL) BY MOUTH 2 (TWO) TIMES DAILY WITH A MEAL., Disp: 180 tablet, Rfl: 1 .  diclofenac sodium (VOLTAREN) 1 % GEL, Apply 2 g topically 4 (four) times daily., Disp: 350 g, Rfl: 1 .  furosemide (LASIX) 20 MG tablet, Take 1 tablet (20 mg total) by mouth daily., Disp: 90 tablet, Rfl: 3 .  KLOR-CON M20 20 MEQ tablet, TAKE 1 TABLET BY MOUTH EVERY DAY, Disp: 90 tablet, Rfl: 3 .  spironolactone (ALDACTONE) 25 MG tablet, TAKE 1/2 TAB DAILY FOR 30 DOSES, Disp: 45 tablet, Rfl: 1 .  tamsulosin (FLOMAX) 0.4 MG CAPS capsule, Take 2 capsules (0.8 mg total) by mouth daily after breakfast., Disp: 60 capsule,  Rfl: 0 .  warfarin (COUMADIN) 5 MG tablet, 1 TABLET BY MOUTH DAILY AT 6PM ALL DAYS OF WEEK EXCEPT TUES ONLY TAKE 1/2 TABLET (Patient taking differently: Take 5 mg by mouth one time only at 6 PM. 1 TABLET BY MOUTH DAILY AT 6PM ALL DAYS OF WEEK EXCEPT TUES ONLY TAKE 1/2 TABLET), Disp: 72 tablet, Rfl: 1 .  aspirin EC 81 MG tablet, Take 1 tablet (81 mg total) by mouth daily. (Patient not taking: Reported on 04/10/2019), Disp: 90 tablet, Rfl: 3 .  lisinopril (ZESTRIL) 5 MG tablet, Take 1 tablet (5 mg total) by mouth daily. Please make follow up appt at office [phone], Disp: 30 tablet, Rfl: 3 Past Medical History:  Diagnosis Date  . Atrial fibrillation (HCC)    post op.  s/p dc-cv 04/2008. previously on amiodarone  . Atrial flutter (HCC)    atypical  . AV block, 1st degree    .450 msec--progressive  . 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  a- Echo 07/2008 Ef 55% mild peroprosthetic MVR with High transmitral gradient (mean 14).  b- normal coronaries by cath 12/2007  . BPH (benign prostatic hyperplasia)   . CHF (congestive heart failure) (HCC)    EF 35-40 % 2011 due to valvular disease and diastolic dysfunction  . Chronic back pain   . CKD (chronic kidney disease), stage III (HCC)   . Dysphagia    with  normal barium swallow 09/2008  . Endocarditis   . GERD (gastroesophageal reflux disease)   . Heavy alcohol use    history  . History of GI bleed   . Hypertension   . IV drug abuse (HCC)    history of  . Lower GI bleed 01/2016  . Pacemaker 2010   Social History   Socioeconomic History  . Marital status: Divorced    Spouse name: Not on file  . Number of children: 9  . Years of education: Not on file  . Highest education level: Not on file  Occupational History  . Occupation: retired    Comment: Veterinary surgeon  Social Needs  . Financial resource strain: Not on file  . Food insecurity    Worry: Not on file    Inability: Not on file  .  Transportation needs    Medical: Not on file    Non-medical: Not on file  Tobacco Use  . Smoking status: Former Smoker    Years: 0.00    Types: Cigarettes  . Smokeless tobacco: Never Used  Substance and Sexual Activity  . Alcohol use: No    Alcohol/week: 0.0 standard drinks  . Drug use: No  . Sexual activity: Not Currently  Lifestyle  . Physical activity    Days per week: Not on file    Minutes per session: Not on file  . Stress: Not on file  Relationships  . Social Musician on phone: Not on file    Gets together: Not on file    Attends religious service: Not on file    Active member of club or organization: Not on file    Attends meetings of clubs or organizations: Not on file    Relationship status: Not on file  Other Topics Concern  . Not on file  Social History Narrative   Current Social History 02/16/2019        Patient lives with family (baby's mama, daughter and 4 grandkids:  Two yo twins, 22 yo and 18 yo in a home which is 1 story. There are 4 steps with handrails up to the entrance the patient uses.       Patient's method of transportation is via family member (baby's mama).      The highest level of education was high school diploma.      The patient currently retired.      Identified important Relationships are "My Baby's [name], [name]."       Pets : None       Interests / Fun: Sitting outdoors watching the birds and planes."       Exercise riding a bike 5 miles 3 days/week      Current Stressors: The grandkids       Religious / Personal Beliefs: "I believe in God."       L. Ducatte, RN, BSN       Family History  Problem Relation Age of Onset  . Coronary artery disease Mother   . Cancer Father        GI  . Cancer Brother        Lung    ASSESSMENT Recent Results: The most recent result is correlated with 25 mg per week: Lab Results  Component Value Date   INR 1.8 (A) 04/10/2019   INR 4.0 (A) 03/31/2019   INR 4.9 (HH)  03/29/2019    Anticoagulation Dosing: Description   Take one (1) tablet daily of your 5mg   peach-colored warfarin tablets at [facility] each day--EXCEPT on TUESDAYS and THURSDAYS, take ONLY one-half (1/2) of your 5mg  peach-colored warfarin tablets on these days.      INR today: Subtherapeutic  PLAN Weekly dose was increased by 20% to 30 mg per week  Patient Instructions  Patient instructed to take medications as defined in the Anti-coagulation Track section of this encounter.  Patient instructed to take today's dose.  Patient instructed to take  one (1) tablet daily of your 5mg  peach-colored warfarin tablets at 6PM each day--EXCEPT on TUESDAYS and THURSDAYS, take ONLY one-half (1/2) of your 5mg  peach-colored warfarin tablets on these days.  Patient verbalized understanding of these instructions.    Patient advised to contact clinic or seek medical attention if signs/symptoms of bleeding or thromboembolism occur.  Patient verbalized understanding by repeating back information and was advised to contact me if further medication-related questions arise. Patient was also provided an information handout.  Follow-up Return in 2 weeks (on 04/24/2019) for Follow up INR.  Pennie Banter, PharmD, CPP  15 minutes spent face-to-face with the patient during the encounter. 50% of time spent on education, including signs/sx bleeding and clotting, as well as food and drug interactions with warfarin. 50% of time was spent on fingerprick POC INR sample collection,processing, results determination, and documentation in [email or web address]/.

## 2019-04-10 NOTE — Patient Instructions (Signed)
Patient instructed to take medications as defined in the Anti-coagulation Track section of this encounter.  Patient instructed to take today's dose.  Patient instructed to take one (1) tablet daily of your 5mg peach-colored warfarin tablets at 6PM each day--EXCEPT on TUESDAYS and THURSDAYS, take ONLY one-half (1/2) of your 5mg peach-colored warfarin tablets on these days.  Patient verbalized understanding of these instructions.   

## 2019-04-10 NOTE — Progress Notes (Signed)
INTERNAL MEDICINE TEACHING ATTENDING ADDENDUM - Wylene Weissman M.D  Duration- indefinite, Indication- mechanical MVR, INR- sub-therapeutic. Agree with pharmacy recommendations as outlined in their note.     

## 2019-04-17 ENCOUNTER — Other Ambulatory Visit (HOSPITAL_COMMUNITY): Payer: Self-pay

## 2019-04-17 DIAGNOSIS — I10 Essential (primary) hypertension: Secondary | ICD-10-CM

## 2019-04-17 MED ORDER — LISINOPRIL 5 MG PO TABS: 5.0000 mg | ORAL_TABLET | Freq: Every day | ORAL | 3 refills | Status: DC

## 2019-04-17 MED ORDER — FUROSEMIDE 20 MG PO TABS: 20.0000 mg | ORAL_TABLET | Freq: Every day | ORAL | 3 refills | Status: DC

## 2019-04-19 ENCOUNTER — Other Ambulatory Visit: Payer: Self-pay | Admitting: *Deleted

## 2019-04-19 DIAGNOSIS — I4892 Unspecified atrial flutter: Secondary | ICD-10-CM

## 2019-04-19 MED ORDER — WARFARIN SODIUM 5 MG PO TABS: ORAL_TABLET | ORAL | 1 refills | Status: DC

## 2019-04-21 ENCOUNTER — Other Ambulatory Visit (HOSPITAL_COMMUNITY): Payer: Self-pay | Admitting: Internal Medicine

## 2019-04-21 ENCOUNTER — Other Ambulatory Visit: Payer: Self-pay | Admitting: *Deleted

## 2019-04-21 DIAGNOSIS — E876 Hypokalemia: Secondary | ICD-10-CM

## 2019-04-23 MED ORDER — POTASSIUM CHLORIDE CRYS ER 20 MEQ PO TBCR: 20.0000 meq | EXTENDED_RELEASE_TABLET | Freq: Every day | ORAL | 3 refills | Status: DC

## 2019-04-24 ENCOUNTER — Ambulatory Visit (INDEPENDENT_AMBULATORY_CARE_PROVIDER_SITE_OTHER): Payer: Medicare Other | Admitting: Pharmacist

## 2019-04-24 ENCOUNTER — Other Ambulatory Visit: Payer: Self-pay

## 2019-04-24 DIAGNOSIS — Z952 Presence of prosthetic heart valve: Secondary | ICD-10-CM | POA: Diagnosis not present

## 2019-04-24 DIAGNOSIS — Z8679 Personal history of other diseases of the circulatory system: Secondary | ICD-10-CM | POA: Diagnosis not present

## 2019-04-24 DIAGNOSIS — Z5181 Encounter for therapeutic drug level monitoring: Secondary | ICD-10-CM

## 2019-04-24 DIAGNOSIS — I499 Cardiac arrhythmia, unspecified: Secondary | ICD-10-CM

## 2019-04-24 DIAGNOSIS — Z7901 Long term (current) use of anticoagulants: Secondary | ICD-10-CM | POA: Diagnosis not present

## 2019-04-24 LAB — POCT INR: INR: 3.2 — AB (ref 2.0–3.0)

## 2019-04-24 NOTE — Patient Instructions (Signed)
Patient instructed to take medications as defined in the Anti-coagulation Track section of this encounter.  Patient instructed to take today's dose.  Patient instructed to take one (1) tablet daily of your 5mg peach-colored warfarin tablets at 6PM each day--EXCEPT on TUESDAYS and THURSDAYS, take ONLY one-half (1/2) of your 5mg peach-colored warfarin tablets on these days.  Patient verbalized understanding of these instructions.   

## 2019-04-24 NOTE — Progress Notes (Signed)
Anticoagulation Management [name] is a 73 y.o. male who reports to the clinic for monitoring of warfarin treatment.    Indication: History of mitral valve replacement with mechanical valve, history of atrial flutter, long term current use of anticoagulant  Duration: indefinite Supervising physician: Joni Reining  Anticoagulation Clinic Visit History: Patient does not report signs/symptoms of bleeding or thromboembolism  Other recent changes: No diet, medications, lifestyle modification endorsed by the patient.  Anticoagulation Episode Summary    Current INR goal:  2.5-3.5  TTR:  61.6 % (6.6 y)  Next INR check:  05/15/2019  INR from last check:  3.2 (04/24/2019)  Weekly max warfarin dose:    Target end date:  Indefinite  INR check location:  Anticoagulation Clinic  Preferred lab:    Send INR reminders to:  [facility] [place]   Indications   IRREGULAR PULSE [I49.9] Long term (current) use of anticoagulants (Resolved) [Z79.01] ATRIAL FLUTTER (Resolved) [I48.92] H/O mitral valve replacement with mechanical valve [Z95.2]       Comments:        Anticoagulation Care Providers    Provider Role Specialty Phone number   Jolaine Artist, MD  Cardiology [phone]      No Known Allergies  Current Outpatient Medications:  .  aspirin EC 81 MG tablet, Take 1 tablet (81 mg total) by mouth daily., Disp: 90 tablet, Rfl: 3 .  carvedilol (COREG) 6.25 MG tablet, TAKE 1 TABLET (6.25 MG TOTAL) BY MOUTH 2 (TWO) TIMES DAILY WITH A MEAL., Disp: 180 tablet, Rfl: 1 .  furosemide (LASIX) 20 MG tablet, Take 1 tablet (20 mg total) by mouth daily., Disp: 90 tablet, Rfl: 3 .  potassium chloride SA (KLOR-CON M20) 20 MEQ tablet, Take 1 tablet (20 mEq total) by mouth daily., Disp: 90 tablet, Rfl: 3 .  spironolactone (ALDACTONE) 25 MG tablet, TAKE 1 TABLET (25 MG TOTAL) BY MOUTH DAILY. PLEASE MAKE FOLLOW UP APPT AT OFFICE [phone], Disp: 30 tablet, Rfl: 0 .  tamsulosin (FLOMAX)  0.4 MG CAPS capsule, Take 2 capsules (0.8 mg total) by mouth daily after breakfast. (Patient taking differently: Take 0.4 mg by mouth daily after breakfast. ), Disp: 60 capsule, Rfl: 0 .  warfarin (COUMADIN) 5 MG tablet, 1 TABLET BY MOUTH DAILY AT 6PM ALL DAYS OF WEEK EXCEPT TUES ONLY TAKE 1/2 TABLET, Disp: 72 tablet, Rfl: 1 .  diclofenac sodium (VOLTAREN) 1 % GEL, Apply 2 g topically 4 (four) times daily., Disp: 350 g, Rfl: 1 .  lisinopril (ZESTRIL) 5 MG tablet, Take 1 tablet (5 mg total) by mouth daily. Please make follow up appt at office [phone] (Patient not taking: Reported on 04/24/2019), Disp: 30 tablet, Rfl: 3 Past Medical History:  Diagnosis Date  . Atrial fibrillation ([place])    post op.  s/p dc-cv 04/2008. previously on amiodarone  . Atrial flutter ([place])    atypical  . AV block, 1st degree    .450 msec--progressive  . 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  a- Echo 07/2008 Ef 55% mild peroprosthetic MVR with High transmitral gradient (mean 14).  b- normal coronaries by cath 12/2007  . BPH (benign prostatic hyperplasia)   . CHF (congestive heart failure) (HCC)    EF 35-40 % 2011 due to valvular disease and diastolic dysfunction  . Chronic back pain   . CKD (chronic kidney disease), stage III ([place])   . Dysphagia    with normal barium swallow 09/2008  .  Endocarditis   . GERD (gastroesophageal reflux disease)   . Heavy alcohol use    history  . History of GI bleed   . Hypertension   . IV drug abuse (HCC)    history of  . Lower GI bleed 01/2016  . Pacemaker 2010   Social History   Socioeconomic History  . Marital status: Divorced    Spouse name: Not on file  . Number of children: 9  . Years of education: Not on file  . Highest education level: Not on file  Occupational History  . Occupation: retired    Comment: Veterinary surgeon  Social Needs  . Financial resource strain: Not on file  . Food insecurity    Worry: Not on file     Inability: Not on file  . Transportation needs    Medical: Not on file    Non-medical: Not on file  Tobacco Use  . Smoking status: Former Smoker    Years: 0.00    Types: Cigarettes  . Smokeless tobacco: Never Used  Substance and Sexual Activity  . Alcohol use: No    Alcohol/week: 0.0 standard drinks  . Drug use: No  . Sexual activity: Not Currently  Lifestyle  . Physical activity    Days per week: Not on file    Minutes per session: Not on file  . Stress: Not on file  Relationships  . Social Musician on phone: Not on file    Gets together: Not on file    Attends religious service: Not on file    Active member of club or organization: Not on file    Attends meetings of clubs or organizations: Not on file    Relationship status: Not on file  Other Topics Concern  . Not on file  Social History Narrative   Current Social History 02/16/2019        Patient lives with family (baby's mama, daughter and 4 grandkids:  Two yo twins, 58 yo and 81 yo in a home which is 1 story. There are 4 steps with handrails up to the entrance the patient uses.       Patient's method of transportation is via family member (baby's mama).      The highest level of education was high school diploma.      The patient currently retired.      Identified important Relationships are "My Baby's [name], [name]."       Pets : None       Interests / Fun: Sitting outdoors watching the birds and planes."       Exercise riding a bike 5 miles 3 days/week      Current Stressors: The grandkids       Religious / Personal Beliefs: "I believe in God."       L. Ducatte, RN, BSN       Family History  Problem Relation Age of Onset  . Coronary artery disease Mother   . Cancer Father        GI  . Cancer Brother        Lung    ASSESSMENT Recent Results: The most recent result is correlated with 30 mg per week: Lab Results  Component Value Date   INR 3.2 (A) 04/24/2019   INR 1.8 (A)  04/10/2019   INR 4.0 (A) 03/31/2019    Anticoagulation Dosing: Description   Take one (1) tablet daily of your 5mg  peach-colored warfarin tablets at [facility] each  day--EXCEPT on TUESDAYS and THURSDAYS, take ONLY one-half (1/2) of your 5mg  peach-colored warfarin tablets on these days.      INR today: Therapeutic  PLAN Weekly dose was unchanged.   Patient Instructions  Patient instructed to take medications as defined in the Anti-coagulation Track section of this encounter.  Patient instructed to take today's dose.  Patient instructed to take  one (1) tablet daily of your 5mg  peach-colored warfarin tablets at 6PM each day--EXCEPT on TUESDAYS and THURSDAYS, take ONLY one-half (1/2) of your 5mg  peach-colored warfarin tablets on these days.  Patient verbalized understanding of these instructions.     Patient advised to contact clinic or seek medical attention if signs/symptoms of bleeding or thromboembolism occur.  Patient verbalized understanding by repeating back information and was advised to contact me if further medication-related questions arise. Patient was also provided an information handout.  Follow-up Return in 3 weeks (on 05/15/2019) for Follow up INR at 1100.  Gerrit Halls, PharmD PGY1 Pharmacy Resident [employer]: [phone]  15 minutes spent face-to-face with the patient during the encounter. 50% of time spent on education, including signs/sx bleeding and clotting, as well as food and drug interactions with warfarin. 50% of time was spent on fingerprick POC INR sample collection,processing, results determination, and documentation in [email or web address].

## 2019-05-02 ENCOUNTER — Other Ambulatory Visit (HOSPITAL_COMMUNITY): Payer: Self-pay

## 2019-05-02 ENCOUNTER — Other Ambulatory Visit: Payer: Self-pay | Admitting: Internal Medicine

## 2019-05-02 DIAGNOSIS — R351 Nocturia: Secondary | ICD-10-CM

## 2019-05-02 DIAGNOSIS — N401 Enlarged prostate with lower urinary tract symptoms: Secondary | ICD-10-CM

## 2019-05-02 MED ORDER — FUROSEMIDE 20 MG PO TABS: 20.0000 mg | ORAL_TABLET | Freq: Every day | ORAL | 3 refills | Status: DC

## 2019-05-03 ENCOUNTER — Other Ambulatory Visit (HOSPITAL_COMMUNITY): Payer: Self-pay

## 2019-05-03 MED ORDER — FUROSEMIDE 20 MG PO TABS: 20.0000 mg | ORAL_TABLET | Freq: Every day | ORAL | 3 refills | Status: DC

## 2019-05-08 ENCOUNTER — Other Ambulatory Visit (HOSPITAL_COMMUNITY): Payer: Self-pay

## 2019-05-08 MED ORDER — FUROSEMIDE 20 MG PO TABS: 20.0000 mg | ORAL_TABLET | Freq: Every day | ORAL | 3 refills | Status: DC

## 2019-05-12 ENCOUNTER — Other Ambulatory Visit (HOSPITAL_COMMUNITY): Payer: Self-pay

## 2019-05-12 MED ORDER — FUROSEMIDE 20 MG PO TABS: 20.0000 mg | ORAL_TABLET | Freq: Every day | ORAL | 3 refills | Status: DC

## 2019-05-15 ENCOUNTER — Ambulatory Visit (INDEPENDENT_AMBULATORY_CARE_PROVIDER_SITE_OTHER): Payer: Medicare Other | Admitting: Pharmacist

## 2019-05-15 ENCOUNTER — Other Ambulatory Visit: Payer: Self-pay

## 2019-05-15 DIAGNOSIS — I4892 Unspecified atrial flutter: Secondary | ICD-10-CM | POA: Diagnosis not present

## 2019-05-15 DIAGNOSIS — Z7901 Long term (current) use of anticoagulants: Secondary | ICD-10-CM

## 2019-05-15 DIAGNOSIS — Z5181 Encounter for therapeutic drug level monitoring: Secondary | ICD-10-CM

## 2019-05-15 DIAGNOSIS — I499 Cardiac arrhythmia, unspecified: Secondary | ICD-10-CM

## 2019-05-15 DIAGNOSIS — Z952 Presence of prosthetic heart valve: Secondary | ICD-10-CM | POA: Diagnosis not present

## 2019-05-15 LAB — POCT INR: INR: 2.7 (ref 2.0–3.0)

## 2019-05-15 NOTE — Patient Instructions (Signed)
Patient instructed to take medications as defined in the Anti-coagulation Track section of this encounter.  Patient instructed to take today's dose.  Patient instructed to take one (1) tablet daily of your 5mg  peach-colored warfarin tablets at 6PM each day--EXCEPT on TUESDAYS and THURSDAYS, take ONLY one-half (1/2) of your 5mg  peach-colored warfarin tablets on these days.  Patient verbalized understanding of these instructions.

## 2019-05-15 NOTE — Progress Notes (Signed)
Anticoagulation Management [name] is a 73 y.o. male who reports to the clinic for monitoring of warfarin treatment.    Indication: Mitral Valve Replacement; Long term current use of anticoagulant.    Duration: indefinite Supervising physician: [facility] Visit History: Patient does not report signs/symptoms of bleeding or thromboembolism  Other recent changes: No diet, medications, lifestyle changes.  Anticoagulation Episode Summary    Current INR goal:  2.5-3.5  TTR:  61.9 % (6.7 y)  Next INR check:  06/12/2019  INR from last check:  2.7 (05/15/2019)  Weekly max warfarin dose:    Target end date:  Indefinite  INR check location:  Anticoagulation Clinic  Preferred lab:    Send INR reminders to:  [facility]   Indications   IRREGULAR PULSE [I49.9] Long term (current) use of anticoagulants (Resolved) [Z79.01] ATRIAL FLUTTER (Resolved) [I48.92] H/O mitral valve replacement with mechanical valve [Z95.2]       Comments:        Anticoagulation Care Providers    Provider Role Specialty Phone number   Jolaine Artist, MD  Cardiology [phone]      No Known Allergies  Current Outpatient Medications:  .  aspirin EC 81 MG tablet, Take 1 tablet (81 mg total) by mouth daily., Disp: 90 tablet, Rfl: 3 .  carvedilol (COREG) 6.25 MG tablet, TAKE 1 TABLET (6.25 MG TOTAL) BY MOUTH 2 (TWO) TIMES DAILY WITH A MEAL., Disp: 180 tablet, Rfl: 1 .  diclofenac sodium (VOLTAREN) 1 % GEL, Apply 2 g topically 4 (four) times daily., Disp: 350 g, Rfl: 1 .  furosemide (LASIX) 20 MG tablet, Take 1 tablet (20 mg total) by mouth daily., Disp: 90 tablet, Rfl: 3 .  lisinopril (ZESTRIL) 5 MG tablet, Take 1 tablet (5 mg total) by mouth daily. Please make follow up appt at office [phone], Disp: 30 tablet, Rfl: 3 .  potassium chloride SA (KLOR-CON M20) 20 MEQ tablet, Take 1 tablet (20 mEq total) by mouth daily., Disp: 90 tablet, Rfl: 3 .   spironolactone (ALDACTONE) 25 MG tablet, TAKE 1 TABLET (25 MG TOTAL) BY MOUTH DAILY. PLEASE MAKE FOLLOW UP APPT AT OFFICE [phone], Disp: 30 tablet, Rfl: 0 .  tamsulosin (FLOMAX) 0.4 MG CAPS capsule, TAKE 2 CAPSULES (0.8 MG TOTAL) BY MOUTH DAILY AFTER BREAKFAST., Disp: 60 capsule, Rfl: 0 .  warfarin (COUMADIN) 5 MG tablet, 1 TABLET BY MOUTH DAILY AT 6PM ALL DAYS OF WEEK EXCEPT TUES ONLY TAKE 1/2 TABLET, Disp: 72 tablet, Rfl: 1 Past Medical History:  Diagnosis Date  . Atrial fibrillation ([place])    post op.  s/p dc-cv 04/2008. previously on amiodarone  . Atrial flutter ([place])    atypical  . AV block, 1st degree    .450 msec--progressive  . 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  a- Echo 07/2008 Ef 55% mild peroprosthetic MVR with High transmitral gradient (mean 14).  b- normal coronaries by cath 12/2007  . BPH (benign prostatic hyperplasia)   . CHF (congestive heart failure) (HCC)    EF 35-40 % 2011 due to valvular disease and diastolic dysfunction  . Chronic back pain   . CKD (chronic kidney disease), stage III ([place])   . Dysphagia    with normal barium swallow 09/2008  . Endocarditis   . GERD (gastroesophageal reflux disease)   . Heavy alcohol use    history  . History of GI bleed   .  Hypertension   . IV drug abuse (HCC)    history of  . Lower GI bleed 01/2016  . Pacemaker 2010   Social History   Socioeconomic History  . Marital status: Divorced    Spouse name: Not on file  . Number of children: 9  . Years of education: Not on file  . Highest education level: Not on file  Occupational History  . Occupation: retired    Comment: Veterinary surgeon  Social Needs  . Financial resource strain: Not on file  . Food insecurity    Worry: Not on file    Inability: Not on file  . Transportation needs    Medical: Not on file    Non-medical: Not on file  Tobacco Use  . Smoking status: Former Smoker    Years: 0.00    Types: Cigarettes  . Smokeless  tobacco: Never Used  Substance and Sexual Activity  . Alcohol use: No    Alcohol/week: 0.0 standard drinks  . Drug use: No  . Sexual activity: Not Currently  Lifestyle  . Physical activity    Days per week: Not on file    Minutes per session: Not on file  . Stress: Not on file  Relationships  . Social Musician on phone: Not on file    Gets together: Not on file    Attends religious service: Not on file    Active member of club or organization: Not on file    Attends meetings of clubs or organizations: Not on file    Relationship status: Not on file  Other Topics Concern  . Not on file  Social History Narrative   Current Social History 02/16/2019        Patient lives with family (baby's mama, daughter and 4 grandkids:  Two yo twins, 20 yo and 25 yo in a home which is 1 story. There are 4 steps with handrails up to the entrance the patient uses.       Patient's method of transportation is via family member (baby's mama).      The highest level of education was high school diploma.      The patient currently retired.      Identified important Relationships are "My Baby's [name], [name]."       Pets : None       Interests / Fun: Sitting outdoors watching the birds and planes."       Exercise riding a bike 5 miles 3 days/week      Current Stressors: The grandkids       Religious / Personal Beliefs: "I believe in God."       L. Ducatte, RN, BSN       Family History  Problem Relation Age of Onset  . Coronary artery disease Mother   . Cancer Father        GI  . Cancer Brother        Lung    ASSESSMENT Recent Results: The most recent result is correlated with 30 mg per week: Lab Results  Component Value Date   INR 2.7 05/15/2019   INR 3.2 (A) 04/24/2019   INR 1.8 (A) 04/10/2019    Anticoagulation Dosing: Description   Take one (1) tablet daily of your 5mg  peach-colored warfarin tablets at 6PM each day--EXCEPT on TUESDAYS and THURSDAYS, take ONLY  one-half (1/2) of your 5mg  peach-colored warfarin tablets on these days.      INR today: Therapeutic  PLAN  Weekly dose was unchanged.   Patient Instructions  Patient instructed to take medications as defined in the Anti-coagulation Track section of this encounter.  Patient instructed to take today's dose.  Patient instructed to take one (1) tablet daily of your 5mg  peach-colored warfarin tablets at 6PM each day--EXCEPT on TUESDAYS and THURSDAYS, take ONLY one-half (1/2) of your 5mg  peach-colored warfarin tablets on these days.  Patient verbalized understanding of these instructions.    Patient advised to contact clinic or seek medical attention if signs/symptoms of bleeding or thromboembolism occur.  Patient verbalized understanding by repeating back information and was advised to contact me if further medication-related questions arise. Patient was also provided an information handout.  Follow-up Return in 4 weeks (on 06/12/2019) for Follow up INR.  , PharmD, CPP  15 minutes spent face-to-face with the patient during the encounter. 50% of time spent on education, including signs/sx bleeding and clotting, as well as food and drug interactions with warfarin. 50% of time was spent on fingerprick POC INR sample collection,processing, results determination, and documentation in 14/01/2019.

## 2019-05-17 NOTE — Progress Notes (Signed)
I reviewed Dr. Gladstone Pih note.  Patient is on [facility] for Aflutter and INR was at goal.

## 2019-05-31 ENCOUNTER — Other Ambulatory Visit (HOSPITAL_COMMUNITY): Payer: Self-pay | Admitting: Internal Medicine

## 2019-06-01 ENCOUNTER — Other Ambulatory Visit: Payer: Self-pay | Admitting: Internal Medicine

## 2019-06-01 DIAGNOSIS — N401 Enlarged prostate with lower urinary tract symptoms: Secondary | ICD-10-CM

## 2019-06-06 NOTE — Telephone Encounter (Signed)
Appears to be duplicate prescription, already refilled tamsulosin on 11/26.

## 2019-06-12 ENCOUNTER — Other Ambulatory Visit: Payer: Self-pay

## 2019-06-12 ENCOUNTER — Ambulatory Visit (INDEPENDENT_AMBULATORY_CARE_PROVIDER_SITE_OTHER): Payer: Medicare Other | Admitting: Pharmacist

## 2019-06-12 DIAGNOSIS — Z8679 Personal history of other diseases of the circulatory system: Secondary | ICD-10-CM

## 2019-06-12 DIAGNOSIS — Z7901 Long term (current) use of anticoagulants: Secondary | ICD-10-CM

## 2019-06-12 DIAGNOSIS — Z5181 Encounter for therapeutic drug level monitoring: Secondary | ICD-10-CM

## 2019-06-12 DIAGNOSIS — Z952 Presence of prosthetic heart valve: Secondary | ICD-10-CM

## 2019-06-12 DIAGNOSIS — I499 Cardiac arrhythmia, unspecified: Secondary | ICD-10-CM

## 2019-06-12 LAB — POCT INR: INR: 2.9 (ref 2.0–3.0)

## 2019-06-12 NOTE — Progress Notes (Signed)
Anticoagulation Management [name] is a 73 y.o. male who reports to the clinic for monitoring of warfarin treatment.    Indication: History of mitral valve replacement (Target INR 2.5 - 3.5); Atrial Flutter; Long term current use of anticoagulant.    Duration: indefinite Supervising physician: Joni Reining  Anticoagulation Clinic Visit History: Patient does not report signs/symptoms of bleeding or thromboembolism  Other recent changes: No diet, medications, lifestyle changes endorsed by the patient at this visit other than as noted in patient findings.  Anticoagulation Episode Summary    Current INR goal:  2.5-3.5  TTR:  62.4 % (6.7 y)  Next INR check:  07/10/2019  INR from last check:  2.9 (06/12/2019)  Weekly max warfarin dose:    Target end date:  Indefinite  INR check location:  Anticoagulation Clinic  Preferred lab:    Send INR reminders to:  [facility]   Indications   IRREGULAR PULSE [I49.9] Long term (current) use of anticoagulants (Resolved) [Z79.01] ATRIAL FLUTTER (Resolved) [I48.92] H/O mitral valve replacement with mechanical valve [Z95.2]       Comments:        Anticoagulation Care Providers    Provider Role Specialty Phone number   Jolaine Artist, MD  Cardiology [phone]      No Known Allergies  Current Outpatient Medications:  .  aspirin EC 81 MG tablet, Take 1 tablet (81 mg total) by mouth daily., Disp: 90 tablet, Rfl: 3 .  carvedilol (COREG) 6.25 MG tablet, TAKE 1 TABLET (6.25 MG TOTAL) BY MOUTH 2 (TWO) TIMES DAILY WITH A MEAL., Disp: 180 tablet, Rfl: 1 .  diclofenac sodium (VOLTAREN) 1 % GEL, Apply 2 g topically 4 (four) times daily., Disp: 350 g, Rfl: 1 .  potassium chloride SA (KLOR-CON M20) 20 MEQ tablet, Take 1 tablet (20 mEq total) by mouth daily., Disp: 90 tablet, Rfl: 3 .  spironolactone (ALDACTONE) 25 MG tablet, TAKE 1 TABLET (25 MG TOTAL) BY MOUTH DAILY. PLEASE MAKE FOLLOW UP APPT AT OFFICE [phone], Disp: 30  tablet, Rfl: 0 .  tamsulosin (FLOMAX) 0.4 MG CAPS capsule, TAKE 2 CAPSULES (0.8 MG TOTAL) BY MOUTH DAILY AFTER BREAKFAST., Disp: 60 capsule, Rfl: 0 .  warfarin (COUMADIN) 5 MG tablet, 1 TABLET BY MOUTH DAILY AT 6PM ALL DAYS OF WEEK EXCEPT TUES ONLY TAKE 1/2 TABLET, Disp: 72 tablet, Rfl: 1 .  furosemide (LASIX) 20 MG tablet, Take 1 tablet (20 mg total) by mouth daily. (Patient not taking: Reported on 06/12/2019), Disp: 90 tablet, Rfl: 3 .  lisinopril (ZESTRIL) 5 MG tablet, Take 1 tablet (5 mg total) by mouth daily. Please make follow up appt at office [phone], Disp: 30 tablet, Rfl: 3 Past Medical History:  Diagnosis Date  . Atrial fibrillation ([place])    post op.  s/p dc-cv 04/2008. previously on amiodarone  . Atrial flutter ([place])    atypical  . AV block, 1st degree    .450 msec--progressive  . 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  a- Echo 07/2008 Ef 55% mild peroprosthetic MVR with High transmitral gradient (mean 14).  b- normal coronaries by cath 12/2007  . BPH (benign prostatic hyperplasia)   . CHF (congestive heart failure) (HCC)    EF 35-40 % 2011 due to valvular disease and diastolic dysfunction  . Chronic back pain   . CKD (chronic kidney disease), stage III ([place])   . Dysphagia    with normal barium swallow 09/2008  .  Endocarditis   . GERD (gastroesophageal reflux disease)   . Heavy alcohol use    history  . History of GI bleed   . Hypertension   . IV drug abuse (HCC)    history of  . Lower GI bleed 01/2016  . Pacemaker 2010   Social History   Socioeconomic History  . Marital status: Divorced    Spouse name: Not on file  . Number of children: 9  . Years of education: Not on file  . Highest education level: Not on file  Occupational History  . Occupation: retired    Comment: Veterinary surgeon  Social Needs  . Financial resource strain: Not on file  . Food insecurity    Worry: Not on file    Inability: Not on file  .  Transportation needs    Medical: Not on file    Non-medical: Not on file  Tobacco Use  . Smoking status: Former Smoker    Years: 0.00    Types: Cigarettes  . Smokeless tobacco: Never Used  Substance and Sexual Activity  . Alcohol use: No    Alcohol/week: 0.0 standard drinks  . Drug use: No  . Sexual activity: Not Currently  Lifestyle  . Physical activity    Days per week: Not on file    Minutes per session: Not on file  . Stress: Not on file  Relationships  . Social Musician on phone: Not on file    Gets together: Not on file    Attends religious service: Not on file    Active member of club or organization: Not on file    Attends meetings of clubs or organizations: Not on file    Relationship status: Not on file  Other Topics Concern  . Not on file  Social History Narrative   Current Social History 02/16/2019        Patient lives with family (baby's mama, daughter and 4 grandkids:  Two yo twins, 16 yo and 45 yo in a home which is 1 story. There are 4 steps with handrails up to the entrance the patient uses.       Patient's method of transportation is via family member (baby's mama).      The highest level of education was high school diploma.      The patient currently retired.      Identified important Relationships are "My Baby's [name], [name]."       Pets : None       Interests / Fun: Sitting outdoors watching the birds and planes."       Exercise riding a bike 5 miles 3 days/week      Current Stressors: The grandkids       Religious / Personal Beliefs: "I believe in God."       L. Ducatte, RN, BSN       Family History  Problem Relation Age of Onset  . Coronary artery disease Mother   . Cancer Father        GI  . Cancer Brother        Lung    ASSESSMENT Recent Results: The most recent result is correlated with 30 mg per week: Lab Results  Component Value Date   INR 2.9 06/12/2019   INR 2.7 05/15/2019   INR 3.2 (A) 04/24/2019     Anticoagulation Dosing: Description   Take one (1) tablet daily of your 5mg  peach-colored warfarin tablets at [facility] each day--EXCEPT on  TUESDAYS and THURSDAYS, take ONLY one-half (1/2) of your 5mg  peach-colored warfarin tablets on these days.      INR today: Therapeutic  PLAN Weekly dose was unchanged.    Patient Instructions  Patient instructed to take medications as defined in the Anti-coagulation Track section of this encounter.  Patient instructed to take today's dose.  Patient instructed to take one (1) tablet daily of your 5mg  peach-colored warfarin tablets at 6PM each day--EXCEPT on TUESDAYS and THURSDAYS, take ONLY one-half (1/2) of your 5mg  peach-colored warfarin tablets on these days.  Patient verbalized understanding of these instructions.    Patient advised to contact clinic or seek medical attention if signs/symptoms of bleeding or thromboembolism occur.  Patient verbalized understanding by repeating back information and was advised to contact me if further medication-related questions arise. Patient was also provided an information handout.  Follow-up Return in 4 weeks (on 07/10/2019) for Follow up INR.  Elicia Lamp, PharmD, CPP  15 minutes spent face-to-face with the patient during the encounter. 50% of time spent on education, including signs/sx bleeding and clotting, as well as food and drug interactions with warfarin. 50% of time was spent on fingerprick POC INR sample collection,processing, results determination, and documentation in [email or web address].

## 2019-06-12 NOTE — Patient Instructions (Signed)
Patient instructed to take medications as defined in the Anti-coagulation Track section of this encounter.  Patient instructed to take today's dose.  Patient instructed to take one (1) tablet daily of your 5mg peach-colored warfarin tablets at 6PM each day--EXCEPT on TUESDAYS and THURSDAYS, take ONLY one-half (1/2) of your 5mg peach-colored warfarin tablets on these days.  Patient verbalized understanding of these instructions.   

## 2019-06-24 ENCOUNTER — Other Ambulatory Visit: Payer: Self-pay | Admitting: Internal Medicine

## 2019-06-24 DIAGNOSIS — S99922A Unspecified injury of left foot, initial encounter: Secondary | ICD-10-CM

## 2019-06-27 ENCOUNTER — Other Ambulatory Visit: Payer: Self-pay | Admitting: Internal Medicine

## 2019-06-27 DIAGNOSIS — R351 Nocturia: Secondary | ICD-10-CM

## 2019-06-27 DIAGNOSIS — N401 Enlarged prostate with lower urinary tract symptoms: Secondary | ICD-10-CM

## 2019-07-04 ENCOUNTER — Other Ambulatory Visit (HOSPITAL_COMMUNITY): Payer: Self-pay | Admitting: Internal Medicine

## 2019-07-04 NOTE — Telephone Encounter (Signed)
Please contact patient to schedule an appointment. Pt hasn't been seen in over a year

## 2019-07-10 ENCOUNTER — Ambulatory Visit (INDEPENDENT_AMBULATORY_CARE_PROVIDER_SITE_OTHER): Payer: Medicare Other | Admitting: Pharmacist

## 2019-07-10 DIAGNOSIS — Z5181 Encounter for therapeutic drug level monitoring: Secondary | ICD-10-CM | POA: Diagnosis not present

## 2019-07-10 DIAGNOSIS — Z7901 Long term (current) use of anticoagulants: Secondary | ICD-10-CM

## 2019-07-10 DIAGNOSIS — Z8679 Personal history of other diseases of the circulatory system: Secondary | ICD-10-CM | POA: Diagnosis not present

## 2019-07-10 DIAGNOSIS — Z952 Presence of prosthetic heart valve: Secondary | ICD-10-CM

## 2019-07-10 DIAGNOSIS — I499 Cardiac arrhythmia, unspecified: Secondary | ICD-10-CM | POA: Diagnosis not present

## 2019-07-10 LAB — POCT INR: INR: 3 (ref 2.0–3.0)

## 2019-07-10 NOTE — Patient Instructions (Signed)
Patient instructed to take medications as defined in the Anti-coagulation Track section of this encounter.  Patient instructed to take today's dose.  Patient instructed to take 1/2 tablet on Mondays, Wednesdays and Fridays. All other days, take one (1) tablet.  Patient verbalized understanding of these instructions.

## 2019-07-10 NOTE — Progress Notes (Signed)
INTERNAL MEDICINE TEACHING ATTENDING ADDENDUM   I agree with pharmacy recommendations as outlined in their note.   Stanly Si, MD  

## 2019-07-10 NOTE — Progress Notes (Signed)
Anticoagulation Management [name] is a 74 y.o. male who reports to the clinic for monitoring of warfarin treatment.    Indication: Atrial fibrillation (history of); Atrial flutter (history of); Long term current use of anticoagulant.  Duration: indefinite Supervising physician: Reymundo Poll, MD  Anticoagulation Clinic Visit History: Patient does not report signs/symptoms of bleeding or thromboembolism  Other recent changes: No diet, medications, lifestyle changes.  Anticoagulation Episode Summary    Current INR goal:  2.5-3.5  TTR:  62.8 % (6.8 y)  Next INR check:  08/07/2019  INR from last check:    Weekly max warfarin dose:    Target end date:  Indefinite  INR check location:  Anticoagulation Clinic  Preferred lab:    Send INR reminders to:  [facility] [address]   Indications   IRREGULAR PULSE [I49.9] Long term (current) use of anticoagulants (Resolved) [Z79.01] ATRIAL FLUTTER (Resolved) [I48.92] H/O mitral valve replacement with mechanical valve [Z95.2]       Comments:        Anticoagulation Care Providers    Provider Role Specialty Phone number   [name], MD  Cardiology [phone]      No Known Allergies  Current Outpatient Medications:  .  aspirin EC 81 MG tablet, Take 1 tablet (81 mg total) by mouth daily., Disp: 90 tablet, Rfl: 3 .  carvedilol (COREG) 6.25 MG tablet, TAKE 1 TABLET (6.25 MG TOTAL) BY MOUTH 2 (TWO) TIMES DAILY WITH A MEAL., Disp: 180 tablet, Rfl: 1 .  diclofenac Sodium (VOLTAREN) 1 % GEL, APPLY 2 GRAMS TO AFFECTED AREA 4 TIMES A DAY, Disp: 300 g, Rfl: 1 .  furosemide (LASIX) 20 MG tablet, Take 1 tablet (20 mg total) by mouth daily., Disp: 90 tablet, Rfl: 3 .  potassium chloride SA (KLOR-CON M20) 20 MEQ tablet, Take 1 tablet (20 mEq total) by mouth daily., Disp: 90 tablet, Rfl: 3 .  spironolactone (ALDACTONE) 25 MG tablet, TAKE 1 TABLET (25 MG TOTAL) BY MOUTH DAILY. PLEASE MAKE FOLLOW UP APPT AT OFFICE [phone],  Disp: 30 tablet, Rfl: 0 .  tamsulosin (FLOMAX) 0.4 MG CAPS capsule, TAKE 2 CAPSULES (0.8 MG TOTAL) BY MOUTH DAILY AFTER BREAKFAST., Disp: 180 capsule, Rfl: 1 .  warfarin (COUMADIN) 5 MG tablet, 1 TABLET BY MOUTH DAILY AT 6PM ALL DAYS OF WEEK EXCEPT TUES ONLY TAKE 1/2 TABLET, Disp: 72 tablet, Rfl: 1 .  lisinopril (ZESTRIL) 5 MG tablet, Take 1 tablet (5 mg total) by mouth daily. Please make follow up appt at office [phone], Disp: 30 tablet, Rfl: 3 Past Medical History:  Diagnosis Date  . Atrial fibrillation ([facility])    post op.  s/p dc-cv 04/2008. previously on amiodarone  . Atrial flutter ([facility])    atypical  . AV block, 1st degree    .450 msec--progressive  . 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  a- Echo 07/2008 Ef 55% mild peroprosthetic MVR with High transmitral gradient (mean 14).  b- normal coronaries by cath 12/2007  . BPH (benign prostatic hyperplasia)   . CHF (congestive heart failure) (HCC)    EF 35-40 % 2011 due to valvular disease and diastolic dysfunction  . Chronic back pain   . CKD (chronic kidney disease), stage III (HCC)   . Dysphagia    with normal barium swallow 09/2008  . Endocarditis   . GERD (gastroesophageal reflux disease)   . Heavy alcohol use    history  . History of  GI bleed   . Hypertension   . IV drug abuse ([facility])    history of  . Lower GI bleed 01/2016  . Pacemaker 2010   Social History   Socioeconomic History  . Marital status: Divorced    Spouse name: Not on file  . Number of children: 9  . Years of education: Not on file  . Highest education level: Not on file  Occupational History  . Occupation: retired    Comment: [employer]  Tobacco Use  . Smoking status: Former Smoker    Years: 0.00    Types: Cigarettes  . Smokeless tobacco: Never Used  Substance and Sexual Activity  . Alcohol use: No    Alcohol/week: 0.0 standard drinks  . Drug use: No  . Sexual activity: Not Currently  Other Topics Concern   . Not on file  Social History Narrative   Current Social History 02/16/2019        Patient lives with family (baby's mama, daughter and 4 grandkids:  Two yo twins, 32 yo and 34 yo in a home which is 1 story. There are 4 steps with handrails up to the entrance the patient uses.       Patient's method of transportation is via family member (baby's mama).      The highest level of education was high school diploma.      The patient currently retired.      Identified important Relationships are "My Baby's [name], [name]."       Pets : None       Interests / Fun: Sitting outdoors watching the birds and planes."       Exercise riding a bike 5 miles 3 days/week      Current Stressors: The grandkids       Religious / Personal Beliefs: "I believe in God."       L. Ducatte, RN, BSN       Social Determinants of Health   Financial Resource Strain:   . Difficulty of Paying Living Expenses: Not on file  Food Insecurity:   . Worried About Programme researcher, broadcasting/film/video in the Last Year: Not on file  . Ran Out of Food in the Last Year: Not on file  Transportation Needs:   . Lack of Transportation (Medical): Not on file  . Lack of Transportation (Non-Medical): Not on file  Physical Activity:   . Days of Exercise per Week: Not on file  . Minutes of Exercise per Session: Not on file  Stress:   . Feeling of Stress : Not on file  Social Connections:   . Frequency of Communication with Friends and Family: Not on file  . Frequency of Social Gatherings with Friends and Family: Not on file  . Attends Religious Services: Not on file  . Active Member of Clubs or Organizations: Not on file  . Attends Banker Meetings: Not on file  . Marital Status: Not on file   Family History  Problem Relation Age of Onset  . Coronary artery disease Mother   . Cancer Father        GI  . Cancer Brother        Lung    ASSESSMENT Recent Results: The most recent result is correlated with 30 mg per  week: Lab Results  Component Value Date   INR 3.0 07/10/2019   INR 2.9 06/12/2019   INR 2.7 05/15/2019    Anticoagulation Dosing: Description   Take 1/2 tablet  on Mondays, Wednesdays and Fridays. All other days, take one (1) tablet.      INR today: Therapeutic  PLAN Weekly dose was decreased by 8% to 27.5 mg per week  Patient Instructions  Patient instructed to take medications as defined in the Anti-coagulation Track section of this encounter.  Patient instructed to take today's dose.  Patient instructed to take 1/2 tablet on Mondays, Wednesdays and Fridays. All other days, take one (1) tablet.  Patient verbalized understanding of these instructions.    Patient advised to contact clinic or seek medical attention if signs/symptoms of bleeding or thromboembolism occur.  Patient verbalized understanding by repeating back information and was advised to contact me if further medication-related questions arise. Patient was also provided an information handout.  Follow-up Return in 4 weeks (on 08/07/2019) for Follow up INR.  Pennie Banter, PharmD, CPP  15 minutes spent face-to-face with the patient during the encounter. 50% of time spent on education, including signs/sx bleeding and clotting, as well as food and drug interactions with warfarin. 50% of time was spent on fingerprick POC INR sample collection,processing, results determination, and documentation in [email or web address]

## 2019-07-13 ENCOUNTER — Other Ambulatory Visit: Payer: Self-pay | Admitting: Internal Medicine

## 2019-07-13 DIAGNOSIS — I5022 Chronic systolic (congestive) heart failure: Secondary | ICD-10-CM

## 2019-07-19 ENCOUNTER — Telehealth (HOSPITAL_COMMUNITY): Payer: Self-pay

## 2019-07-19 NOTE — Telephone Encounter (Signed)
COVID-19 pre-appointment screening questions:   Do you have a history of COVID-19 or a positive test result in the past 7-10 days? NO  To the best of your knowledge, have you been in close contact with anyone with a confirmed diagnosis of COVID 19?NO  Have you had any one or more of the following: Fever, chills, cough, shortness of breath (out of the normal for you) or any flu-like symptoms?NO  Are you experiencing any of the following symptoms that is new or out of usual for you:YES  . Ear,NOSE or throat discomfort X2 WEEKS,VISIT CHANGED TO VIRTUAL . Sore throat . Headache . Muscle Pain . Diarrhea . Loss of taste or smell   Reviewed all the following with patient:  . Use of hand sanitizer when entering the building . Everyone is required to wear a mask in the building, if you do not have a mask we are happy to provide you with one when you arrive . NO Visitor guidelines   If patient answers YES to any of questions they must change to a virtual visit and place note in comments about symptoms

## 2019-07-20 ENCOUNTER — Other Ambulatory Visit: Payer: Self-pay

## 2019-07-20 ENCOUNTER — Ambulatory Visit (HOSPITAL_COMMUNITY)
Admission: RE | Admit: 2019-07-20 | Discharge: 2019-07-20 | Disposition: A | Discharge status: Home / Self Care | Payer: Medicare Other | Source: Ambulatory Visit | Attending: Internal Medicine | Admitting: Internal Medicine

## 2019-07-20 DIAGNOSIS — I5022 Chronic systolic (congestive) heart failure: Secondary | ICD-10-CM | POA: Diagnosis not present

## 2019-07-20 DIAGNOSIS — Z952 Presence of prosthetic heart valve: Secondary | ICD-10-CM

## 2019-07-20 DIAGNOSIS — I480 Paroxysmal atrial fibrillation: Secondary | ICD-10-CM

## 2019-07-20 DIAGNOSIS — Z95810 Presence of automatic (implantable) cardiac defibrillator: Secondary | ICD-10-CM

## 2019-07-20 NOTE — Addendum Note (Signed)
Encounter addended by: Noralee Space, RN on: 07/20/2019 12:56 PM  Actions taken: Order list changed, Diagnosis association updated, Clinical Note Signed

## 2019-07-20 NOTE — Progress Notes (Signed)
Orders per Dr Gala Romney:  Herbert Seta -  1. Please arrange BMET, BNP, CBC and echo in near future.  2. 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

## 2019-07-20 NOTE — Addendum Note (Signed)
Encounter addended by: Noralee Space, RN on: 07/20/2019 1:05 PM  Actions taken: Clinical Note Signed

## 2019-07-20 NOTE — Progress Notes (Signed)
Spoke w/pt, scheduled echo and lab work.  AVS mailed to pt with appt reminder cards.  Message sent to device clinic to make sure pt is downloading info.

## 2019-07-20 NOTE — Patient Instructions (Signed)
Lab work in 2 weeks--Tue Jan 26th  Your physician has requested that you have an echocardiogram. Echocardiography is a painless test that uses sound waves to create images of your heart. It provides your doctor with information about the size and shape of your heart and how well your heart's chambers and valves are working. This procedure takes approximately one hour. There are no restrictions for this procedure.  Tue 08/01/2019 at 9 AM  Please call us in June to schedule your follow up appointment  If you have any questions or concerns before your next appointment please send [place] a message through [place] or call our office at [phone].  At the [facility], you and your health needs are our priority. As part of our continuing mission to provide you with exceptional heart care, we have created designated Provider Care Teams. These Care Teams include your primary Cardiologist (physician) and Advanced Practice Providers (APPs- Physician Assistants and Nurse Practitioners) who all work together to provide you with the care you need, when you need it.   You may see any of the following providers on your designated Care Team at your next follow up: Marland Kitchen Dr [name] . Dr Marca Ancona . Tonye Becket, NP . Robbie Lis, PA . Karle Plumber, PharmD   Please be sure to bring in all your medications bottles to every appointment.

## 2019-07-20 NOTE — Progress Notes (Signed)
Heart Failure TeleHealth Note  Due to national recommendations of social distancing due to COVID 19, Audio/video telehealth visit is felt to be most appropriate for this patient at this time.  See MyChart message from today for patient consent regarding telehealth for [facility].  Date:  07/20/2019   ID:  [name], DOB Jan 14, 1946, MRN [id number]  Location: Home  Provider location: [facility] Type of Visit: Established patient  PCP:  Dellia Cloud, MD  Cardiologist:  No primary care provider on file. Primary HF: Kinsey Karch  Chief Complaint: Heart Failure follow-up   History of Present Illness:  [name] is a 74 year old male with h/o polysubstance abuse c/b MV endocarditis s/p St. Jude MVR, CHF due to NICM, CKD IIIb and PAF.  Underwent CRT-P implant for bradycardia and fatigue. (Had gen change in 6/19.)  He presents today for telemedicine up. We have not seen him since 7/19 Says he is doing ok. Says he can up to 5-6 miles at a time. No SOB or CP. No edema, orthopnea or PND. Taking warfarin. No bleeding. Had all his teeth pulled at dentist. Not checking BP regularly but in looking in computer SBP seems to run 104-130.   Has not had device check in computer since 9/19. Will alert the device clinic   Echo 12/10/17 EF 35-40% with mechanical MVR   [name] denies symptoms worrisome for COVID 19.   Studies:  TTE in 2010 showed normal EF with elevated gradient across MV (mean 14) with perivalvular leak. Follow-up TEE in June 2010 with EF 40-45% mild stenosis mean gradient = 8. No vegetation or abscess.   Echo 8/12 EF 35% mild perivalvular MR.  Echo 3/13 EF 50% mild MVR stable with mild peri-valvular leak.  ECHO 12/16/2012 EF 50% stable MVR ECHO 12/2014 - EF 25-30%  Grade I DD, mechanical mitral valve with mean gradient 6 mmHg, no significant MR.  ECHO 09/2015- EF 35-40%. Grade I DD Mitral Valve ok no stenosis    Past Medical  History:  Diagnosis Date  . Atrial fibrillation (HCC)    post op.  s/p dc-cv 04/2008. previously on amiodarone  . Atrial flutter (HCC)    atypical  . AV block, 1st degree    .450 msec--progressive  . 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  a- Echo 07/2008 Ef 55% mild peroprosthetic MVR with High transmitral gradient (mean 14).  b- normal coronaries by cath 12/2007  . BPH (benign prostatic hyperplasia)   . CHF (congestive heart failure) (HCC)    EF 35-40 % 2011 due to valvular disease and diastolic dysfunction  . Chronic back pain   . CKD (chronic kidney disease), stage III (HCC)   . Dysphagia    with normal barium swallow 09/2008  . Endocarditis   . GERD (gastroesophageal reflux disease)   . Heavy alcohol use    history  . History of GI bleed   . Hypertension   . IV drug abuse (HCC)    history of  . Lower GI bleed 01/2016  . Pacemaker 2010   Past Surgical History:  Procedure Laterality Date  . BIV PACEMAKER GENERATOR CHANGEOUT N/A 12/13/2017   Procedure: BIV PACEMAKER GENERATOR CHANGEOUT;  Surgeon: Duke Salvia, MD;  Location: [facility] INVASIVE CV LAB;  Service: Cardiovascular;  Laterality: N/A;  . CARDIAC VALVE REPLACEMENT     mitral valve, st jude mechanical   . COLONOSCOPY N/A  03/26/2014   Procedure: COLONOSCOPY;  Surgeon: Rachael Fee, MD;  Location: [facility] ENDOSCOPY;  Service: Endoscopy;  Laterality: N/A;  . ENTEROSCOPY N/A 03/28/2014   Procedure: ENTEROSCOPY;  Surgeon: Rachael Fee, MD;  Location: [facility] ENDOSCOPY;  Service: Endoscopy;  Laterality: N/A;  . ESOPHAGOGASTRODUODENOSCOPY N/A 03/25/2014   Procedure: ESOPHAGOGASTRODUODENOSCOPY (EGD);  Surgeon: Iva Boop, MD;  Location: [facility] ENDOSCOPY;  Service: Endoscopy;  Laterality: N/A;  . GIVENS CAPSULE STUDY N/A 03/26/2014   Procedure: GIVENS CAPSULE STUDY;  Surgeon: Rachael Fee, MD;  Location: [facility] ENDOSCOPY;  Service: Endoscopy;  Laterality: N/A;  . PACEMAKER INSERTION     inserted about  4-5 years ago     Current Outpatient Medications  Medication Sig Dispense Refill  . carvedilol (COREG) 6.25 MG tablet TAKE 1 TABLET BY MOUTH 2 TIMES DAILY WITH A MEAL. 180 tablet 1  . aspirin EC 81 MG tablet Take 1 tablet (81 mg total) by mouth daily. 90 tablet 3  . diclofenac Sodium (VOLTAREN) 1 % GEL APPLY 2 GRAMS TO AFFECTED AREA 4 TIMES A DAY 300 g 1  . furosemide (LASIX) 20 MG tablet Take 1 tablet (20 mg total) by mouth daily. 90 tablet 3  . lisinopril (ZESTRIL) 5 MG tablet Take 1 tablet (5 mg total) by mouth daily. Please make follow up appt at office [phone] 30 tablet 3  . potassium chloride SA (KLOR-CON M20) 20 MEQ tablet Take 1 tablet (20 mEq total) by mouth daily. 90 tablet 3  . spironolactone (ALDACTONE) 25 MG tablet TAKE 1 TABLET (25 MG TOTAL) BY MOUTH DAILY. PLEASE MAKE FOLLOW UP APPT AT OFFICE [phone] 30 tablet 0  . tamsulosin (FLOMAX) 0.4 MG CAPS capsule TAKE 2 CAPSULES (0.8 MG TOTAL) BY MOUTH DAILY AFTER BREAKFAST. 180 capsule 1  . warfarin (COUMADIN) 5 MG tablet 1 TABLET BY MOUTH DAILY AT 6PM ALL DAYS OF WEEK EXCEPT TUES ONLY TAKE 1/2 TABLET 72 tablet 1   No current facility-administered medications for this encounter.    Allergies:   Patient has no known allergies.   Social History:  The patient  reports that he has quit smoking. His smoking use included cigarettes. He quit after 0.00 years of use. He has never used smokeless tobacco. He reports that he does not drink alcohol or use drugs.   Family History:  The patient's family history includes Cancer in his brother and father; Coronary artery disease in his mother.   ROS:  Please see the history of present illness.   All other systems are personally reviewed and negative.   Exam:  (Video/Tele Health Call; Exam is subjective and or/visual.) General:  Speaks in full sentences. No resp difficulty. Lungs: Normal respiratory effort with conversation.  Abdomen: Non-distended per patient report Extremities: Pt  denies edema. Neuro: Alert & oriented x 3.   Recent Labs: 03/02/2019: BUN 26; Creatinine, Ser 1.77; Potassium 5.2; Sodium 137 03/29/2019: Hemoglobin 13.5; Platelets 305  Personally reviewed   Wt Readings from Last 3 Encounters:  03/29/19 82.6 kg (182 lb 3.2 oz)  03/02/19 81.2 kg (179 lb)  02/16/19 80.7 kg (178 lb)      ASSESSMENT AND PLAN:  1. Chronic systolic HF: Nonischemic cardiomyopathy s/p St Jude  CRT-D.  March 2017 EF improved 35-40%.  - Doing very well. NYHA I - Volume status appears good.  - Continue Coreg 6.25 mg bid.  - Continue Lisinopril 5 daily (had to cut back from 5 bid in past due to low BP). Has also limited switch  to [employer] - Continue 25 mg spiro daily - Continue SGLT2i down the road - Will connect him with device clinic to get updated download - Get repeat echo and labs.  2. Mechanical mitral valve:  - .On coumadin + aspirin 81 mg daily.   Goal INR 2.5-3.5.   INR followed with IMTS - MVR stable on echo 6/19. Needs repeat echo  - Reminded about need for SSBE prophylaxis  with procedures  3. GI Bleed: 03/2014,  - no BRBPR/melena. Continue PPI.  4. HTN - BP runs low 5. PAF -  In NSR on lasit check. Followed by Dr.Klein  - continue coumadin  6. Memory Impairment - Follows with PCP.  - Seems to be doing well today    COVID screen The patient does not have any symptoms that suggest any further testing/ screening at this time.  Social distancing reinforced today.  Recommended follow-up:  As above  Relevant cardiac medications were reviewed at length with the patient today.   The patient does not have concerns regarding their medications at this time.   The following changes were made today:  As above  Today, I have spent 18 minutes with the patient with telehealth technology discussing the above issues .    Signed, Glori Bickers, MD  07/20/2019 11:14 AM  [facility]  [place] [address] [phone] (office) [phone] (fax)

## 2019-07-21 ENCOUNTER — Telehealth: Payer: Self-pay

## 2019-07-21 NOTE — Telephone Encounter (Signed)
-----   Message from Noralee Space, RN sent at 07/20/2019  1:01 PM EST ----- [facility]le guys, looks like he hasn't been sending his remote transmissions, can you guys reach out to him and get that set up, thanks

## 2019-07-21 NOTE — Telephone Encounter (Signed)
I called the pt to try to get a transmission from his monitor but was unsuccessful. I conference the pt and [employer] to trouble shoot the monitor. [employer] is sending the pt a new cell adapter for his monitor. When the pt receives the cell adapter they are to call [employer] to get it set up.

## 2019-08-01 ENCOUNTER — Ambulatory Visit (HOSPITAL_BASED_OUTPATIENT_CLINIC_OR_DEPARTMENT_OTHER)
Admission: RE | Admit: 2019-08-01 | Discharge: 2019-08-01 | Disposition: A | Discharge status: Home / Self Care | Payer: Medicare Other | Source: Ambulatory Visit | Attending: Internal Medicine | Admitting: Internal Medicine

## 2019-08-01 ENCOUNTER — Other Ambulatory Visit (HOSPITAL_COMMUNITY): Payer: Self-pay | Admitting: Internal Medicine

## 2019-08-01 ENCOUNTER — Other Ambulatory Visit: Payer: Self-pay

## 2019-08-01 ENCOUNTER — Ambulatory Visit (HOSPITAL_COMMUNITY)
Admission: RE | Admit: 2019-08-01 | Discharge: 2019-08-01 | Disposition: A | Discharge status: Home / Self Care | Payer: Medicare Other | Source: Ambulatory Visit | Attending: Cardiology | Admitting: Cardiology

## 2019-08-01 DIAGNOSIS — N189 Chronic kidney disease, unspecified: Secondary | ICD-10-CM | POA: Diagnosis not present

## 2019-08-01 DIAGNOSIS — I5022 Chronic systolic (congestive) heart failure: Secondary | ICD-10-CM | POA: Insufficient documentation

## 2019-08-01 DIAGNOSIS — Z950 Presence of cardiac pacemaker: Secondary | ICD-10-CM | POA: Insufficient documentation

## 2019-08-01 LAB — CBC
HCT: 49 % (ref 39.0–52.0)
Hemoglobin: 15.6 g/dL (ref 13.0–17.0)
MCH: 29.3 pg (ref 26.0–34.0)
MCHC: 31.8 g/dL (ref 30.0–36.0)
MCV: 92.1 fL (ref 80.0–100.0)
Platelets: 314 10*3/uL (ref 150–400)
RBC: 5.32 MIL/uL (ref 4.22–5.81)
RDW: 14.7 % (ref 11.5–15.5)
WBC: 4.2 10*3/uL (ref 4.0–10.5)
nRBC: 0 % (ref 0.0–0.2)

## 2019-08-01 LAB — BASIC METABOLIC PANEL
Anion gap: 10 (ref 5–15)
BUN: 22 mg/dL (ref 8–23)
CO2: 24 mmol/L (ref 22–32)
Calcium: 9.3 mg/dL (ref 8.9–10.3)
Chloride: 103 mmol/L (ref 98–111)
Creatinine, Ser: 1.91 mg/dL — ABNORMAL HIGH (ref 0.61–1.24)
GFR calc Af Amer: 39 mL/min — ABNORMAL LOW (ref >60)
GFR calc non Af Amer: 34 mL/min — ABNORMAL LOW (ref >60)
Glucose, Bld: 99 mg/dL (ref 70–99)
Potassium: 5.2 mmol/L — ABNORMAL HIGH (ref 3.5–5.1)
Sodium: 137 mmol/L (ref 135–145)

## 2019-08-01 LAB — BRAIN NATRIURETIC PEPTIDE: B Natriuretic Peptide: 79.7 pg/mL (ref 0.0–100.0)

## 2019-08-01 NOTE — Progress Notes (Signed)
  Echocardiogram 2D Echocardiogram has been performed.  Delcie Roch 08/01/2019, 9:34 AM

## 2019-08-02 NOTE — Telephone Encounter (Signed)
The pt caregiver states she got the cell adapter but the monitor still not working. I told her I will order him a new monitor. I called her back to verify the address but she did not answer. I will call back tomorrow to verify the address for the monitor.

## 2019-08-03 NOTE — Telephone Encounter (Signed)
Ordered pt transmitter 08-03-2019

## 2019-08-07 ENCOUNTER — Ambulatory Visit (INDEPENDENT_AMBULATORY_CARE_PROVIDER_SITE_OTHER): Payer: Medicare Other | Admitting: Pharmacist

## 2019-08-07 DIAGNOSIS — Z5181 Encounter for therapeutic drug level monitoring: Secondary | ICD-10-CM

## 2019-08-07 DIAGNOSIS — I499 Cardiac arrhythmia, unspecified: Secondary | ICD-10-CM | POA: Diagnosis not present

## 2019-08-07 DIAGNOSIS — Z7901 Long term (current) use of anticoagulants: Secondary | ICD-10-CM

## 2019-08-07 DIAGNOSIS — Z8679 Personal history of other diseases of the circulatory system: Secondary | ICD-10-CM

## 2019-08-07 DIAGNOSIS — Z952 Presence of prosthetic heart valve: Secondary | ICD-10-CM

## 2019-08-07 LAB — POCT INR: INR: 2.9 (ref 2.0–3.0)

## 2019-08-07 NOTE — Progress Notes (Signed)
INTERNAL MEDICINE TEACHING ATTENDING ADDENDUM   I agree with pharmacy recommendations as outlined in their note.   Melora Menon, MD  

## 2019-08-07 NOTE — Progress Notes (Signed)
Anticoagulation Management [name] is a 74 y.o. male who reports to the clinic for monitoring of warfarin treatment.    Indication: History of mitral valve replacement; Long term current use of anticoagulant.    Duration: indefinite Supervising physician: Reymundo Poll, MD  Anticoagulation Clinic Visit History: Patient does not report signs/symptoms of bleeding or thromboembolism  Other recent changes: No diet, medications, lifestyle changes.  Anticoagulation Episode Summary    Current INR goal:  2.5-3.5  TTR:  63.2 % (6.9 y)  Next INR check:  09/04/2019  INR from last check:  2.9 (08/07/2019)  Weekly max warfarin dose:    Target end date:  Indefinite  INR check location:  Anticoagulation Clinic  Preferred lab:    Send INR reminders to:  ANT[facility] [address]   Indications   IRREGULAR PULSE [I49.9] Long term (current) use of anticoagulants (Resolved) [Z79.01] ATRIAL FLUTTER (Resolved) [I48.92] H/O mitral valve replacement with mechanical valve [Z95.2]       Comments:        Anticoagulation Care Providers    Provider Role Specialty Phone number   [name], MD  Cardiology [phone]      No Known Allergies  Current Outpatient Medications:  .  aspirin EC 81 MG tablet, Take 1 tablet (81 mg total) by mouth daily., Disp: 90 tablet, Rfl: 3 .  carvedilol (COREG) 6.25 MG tablet, TAKE 1 TABLET BY MOUTH 2 TIMES DAILY WITH A MEAL., Disp: 180 tablet, Rfl: 1 .  diclofenac Sodium (VOLTAREN) 1 % GEL, APPLY 2 GRAMS TO AFFECTED AREA 4 TIMES A DAY, Disp: 300 g, Rfl: 1 .  furosemide (LASIX) 20 MG tablet, Take 1 tablet (20 mg total) by mouth daily., Disp: 90 tablet, Rfl: 3 .  potassium chloride SA (KLOR-CON M20) 20 MEQ tablet, Take 1 tablet (20 mEq total) by mouth daily., Disp: 90 tablet, Rfl: 3 .  spironolactone (ALDACTONE) 25 MG tablet, TAKE 1 TABLET (25 MG TOTAL) BY MOUTH DAILY. PLEASE MAKE FOLLOW UP APPT AT OFFICE [phone], Disp: 30 tablet, Rfl: 0 .   tamsulosin (FLOMAX) 0.4 MG CAPS capsule, TAKE 2 CAPSULES (0.8 MG TOTAL) BY MOUTH DAILY AFTER BREAKFAST., Disp: 180 capsule, Rfl: 1 .  warfarin (COUMADIN) 5 MG tablet, 1 TABLET BY MOUTH DAILY AT 6PM ALL DAYS OF WEEK EXCEPT TUES ONLY TAKE 1/2 TABLET, Disp: 72 tablet, Rfl: 1 .  lisinopril (ZESTRIL) 5 MG tablet, Take 1 tablet (5 mg total) by mouth daily. Please make follow up appt at office [phone], Disp: 30 tablet, Rfl: 3 Past Medical History:  Diagnosis Date  . Atrial fibrillation (HCC)    post op.  s/p dc-cv 04/2008. previously on amiodarone  . Atrial flutter (HCC)    atypical  . AV block, 1st degree    .450 msec--progressive  . 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  a- Echo 07/2008 Ef 55% mild peroprosthetic MVR with High transmitral gradient (mean 14).  b- normal coronaries by cath 12/2007  . BPH (benign prostatic hyperplasia)   . CHF (congestive heart failure) (HCC)    EF 35-40 % 2011 due to valvular disease and diastolic dysfunction  . Chronic back pain   . CKD (chronic kidney disease), stage III (HCC)   . Dysphagia    with normal barium swallow 09/2008  . Endocarditis   . GERD (gastroesophageal reflux disease)   . Heavy alcohol use    history  . History of GI bleed   .  Hypertension   . IV drug abuse ([place])    history of  . Lower GI bleed 01/2016  . Pacemaker 2010   Social History   Socioeconomic History  . Marital status: Divorced    Spouse name: Not on file  . Number of children: 76  . Years of education: Not on file  . Highest education level: Not on file  Occupational History  . Occupation: retired    Comment: [employer]  Tobacco Use  . Smoking status: Former Smoker    Years: 0.00    Types: Cigarettes  . Smokeless tobacco: Never Used  Substance and Sexual Activity  . Alcohol use: No    Alcohol/week: 0.0 standard drinks  . Drug use: No  . Sexual activity: Not Currently  Other Topics Concern  . Not on file  Social  History Narrative   Current Social History 02/16/2019        Patient lives with family (baby's 53, daughter and 4 grandkids:  48 yo twins, 54 yo and 32 yo in a home which is 1 story. There are 4 steps with handrails up to the entrance the patient uses.       Patient's method of transportation is via family member (baby's mama).      The highest level of education was high school diploma.      The patient currently retired.      Identified important Relationships are "My 33 Joselyn Arrow, [name]."       Pets : None       Interests / Fun: Sitting outdoors watching the birds and planes."       Exercise riding a bike 5 miles 3 days/week      Current Stressors: The grandkids       Religious / Personal Beliefs: "I believe in God."       L. Ducatte, RN, BSN       Social Determinants of Health   Financial Resource Strain:   . Difficulty of Paying Living Expenses: Not on file  Food Insecurity:   . Worried About Charity fundraiser in the Last Year: Not on file  . Ran Out of Food in the Last Year: Not on file  Transportation Needs:   . Lack of Transportation (Medical): Not on file  . Lack of Transportation (Non-Medical): Not on file  Physical Activity:   . Days of Exercise per Week: Not on file  . Minutes of Exercise per Session: Not on file  Stress:   . Feeling of Stress : Not on file  Social Connections:   . Frequency of Communication with Friends and Family: Not on file  . Frequency of Social Gatherings with Friends and Family: Not on file  . Attends Religious Services: Not on file  . Active Member of Clubs or Organizations: Not on file  . Attends Archivist Meetings: Not on file  . Marital Status: Not on file   Family History  Problem Relation Age of Onset  . Coronary artery disease Mother   . Cancer Father        GI  . Cancer Brother        Lung    ASSESSMENT Recent Results: The most recent result is correlated with 27.5 mg per week: Lab Results   Component Value Date   INR 2.9 08/07/2019   INR 3.0 07/10/2019   INR 2.9 06/12/2019    Anticoagulation Dosing: Description   Take 1/2 tablet on Sundays, Mondays, Wednesdays and  Fridays. All other days (Tuesdays, Thursdays and Saturdays) take one (1) tablet.      INR today: Therapeutic  PLAN Weekly dose was decreased by 9% to 25 mg per week  Patient Instructions  Patient instructed to take medications as defined in the Anti-coagulation Track section of this encounter.  Patient instructed to take today's dose.  Patient instructed to take 1/2 tablet on Sundays, Mondays, Wednesdays and Fridays. All other days (Tuesdays, Thursdays and Saturdays) take one (1) tablet. Patient verbalized understanding of these instructions.    Patient advised to contact clinic or seek medical attention if signs/symptoms of bleeding or thromboembolism occur.  Patient verbalized understanding by repeating back information and was advised to contact me if further medication-related questions arise. Patient was also provided an information handout.  Follow-up Return in 4 weeks (on 09/04/2019) for Follow up INR.  Elicia Lamp, PharmD, CPP  15 minutes spent face-to-face with the patient during the encounter. 50% of time spent on education, including signs/sx bleeding and clotting, as well as food and drug interactions with warfarin. 50% of time was spent on fingerprick POC INR sample collection,processing, results determination, and documentation in [email or web address].

## 2019-08-07 NOTE — Patient Instructions (Signed)
Patient instructed to take medications as defined in the Anti-coagulation Track section of this encounter.  Patient instructed to take today's dose.  Patient instructed to take 1/2 tablet on Sundays, Mondays, Wednesdays and Fridays. All other days (Tuesdays, Thursdays and Saturdays) take one (1) tablet. Patient verbalized understanding of these instructions.

## 2019-08-12 ENCOUNTER — Other Ambulatory Visit: Payer: Self-pay | Admitting: Internal Medicine

## 2019-08-12 DIAGNOSIS — S99922A Unspecified injury of left foot, initial encounter: Secondary | ICD-10-CM

## 2019-08-22 ENCOUNTER — Telehealth: Payer: Self-pay

## 2019-08-22 NOTE — Telephone Encounter (Signed)
[name] tried to send a transmission with the pt monitor but I do not know if she has it hooked up properly. I made the pt an appointment with the device clinic to get help with the monitor. [name] will be coming with the pt.

## 2019-08-29 ENCOUNTER — Ambulatory Visit: Payer: Medicare Other | Admitting: *Deleted

## 2019-08-29 ENCOUNTER — Ambulatory Visit (INDEPENDENT_AMBULATORY_CARE_PROVIDER_SITE_OTHER): Payer: Medicare Other | Admitting: *Deleted

## 2019-08-29 ENCOUNTER — Other Ambulatory Visit: Payer: Self-pay

## 2019-08-29 DIAGNOSIS — Z950 Presence of cardiac pacemaker: Secondary | ICD-10-CM

## 2019-08-29 DIAGNOSIS — I4819 Other persistent atrial fibrillation: Secondary | ICD-10-CM

## 2019-08-29 LAB — CUP PACEART REMOTE DEVICE CHECK
Battery Remaining Longevity: 100 mo
Battery Remaining Percentage: 95.5 %
Battery Voltage: 2.99 V
Date Time Interrogation Session: 20210223104505
Implantable Lead Implant Date: 20120905
Implantable Lead Implant Date: 20120905
Implantable Lead Implant Date: 20120905
Implantable Lead Location: 753858
Implantable Lead Location: 753859
Implantable Lead Location: 753860
Implantable Pulse Generator Implant Date: 20190610
Lead Channel Impedance Value: 430 Ohm
Lead Channel Impedance Value: 780 Ohm
Lead Channel Pacing Threshold Amplitude: 1 V
Lead Channel Pacing Threshold Amplitude: 1.25 V
Lead Channel Pacing Threshold Pulse Width: 0.5 ms
Lead Channel Pacing Threshold Pulse Width: 0.6 ms
Lead Channel Sensing Intrinsic Amplitude: 12 mV
Lead Channel Setting Pacing Amplitude: 2 V
Lead Channel Setting Pacing Amplitude: 2.25 V
Lead Channel Setting Pacing Pulse Width: 0.5 ms
Lead Channel Setting Pacing Pulse Width: 0.6 ms
Lead Channel Setting Sensing Sensitivity: 2 mV
Pulse Gen Model: 3222
Pulse Gen Serial Number: 9022287

## 2019-08-29 NOTE — Patient Instructions (Signed)
Place cell adapter in side of monitor at home and call the tech support # on the front of the monitor to set it up.

## 2019-08-29 NOTE — Progress Notes (Signed)
Patient in to receive assistance on operation of new [place] monitor. Patient had no cell adapter with monitor. Contacted [employer] tech support for assistance and determined patient has cell adapter at home that was received in January.  Patient educated on placing cell adapter in monitor and will contact [employer] tech support when he gets home to assist with completion of transmission.

## 2019-08-29 NOTE — Progress Notes (Signed)
PPM Remote  

## 2019-09-04 ENCOUNTER — Ambulatory Visit: Payer: Medicare Other | Attending: Internal Medicine

## 2019-09-04 ENCOUNTER — Ambulatory Visit (INDEPENDENT_AMBULATORY_CARE_PROVIDER_SITE_OTHER): Payer: Medicare Other

## 2019-09-04 DIAGNOSIS — Z952 Presence of prosthetic heart valve: Secondary | ICD-10-CM

## 2019-09-04 DIAGNOSIS — Z7901 Long term (current) use of anticoagulants: Secondary | ICD-10-CM | POA: Diagnosis not present

## 2019-09-04 DIAGNOSIS — Z23 Encounter for immunization: Secondary | ICD-10-CM

## 2019-09-04 DIAGNOSIS — Z5181 Encounter for therapeutic drug level monitoring: Secondary | ICD-10-CM | POA: Diagnosis not present

## 2019-09-04 LAB — POCT INR: INR: 2.8 (ref 2.0–3.0)

## 2019-09-04 NOTE — Progress Notes (Signed)
Anticoagulation Management [name] is a 74 y.o. male who reports to the clinic for monitoring of warfarin treatment.    Indication: atrial fibrillation and mirtal valve regurgitation  Duration: indefinite Supervising physician: Blanch Media  Anticoagulation Clinic Visit History: Patient does not report signs/symptoms of bleeding or thromboembolism  Other recent changes: no diet, medications, lifestyle changes endorsed at this visit Anticoagulation Episode Summary    Current INR goal:  2.5-3.5  TTR:  63.6 % (7 y)  Next INR check:  09/25/2019  INR from last check:  2.8 (09/04/2019)  Weekly max warfarin dose:    Target end date:  Indefinite  INR check location:  Anticoagulation Clinic  Preferred lab:    Send INR reminders to:  [facility] [address]   Indications   IRREGULAR PULSE [I49.9] Long term (current) use of anticoagulants (Resolved) [Z79.01] ATRIAL FLUTTER (Resolved) [I48.92] H/O mitral valve replacement with mechanical valve [Z95.2]       Comments:        Anticoagulation Care Providers    Provider Role Specialty Phone number   [name], MD  Cardiology [phone]      No Known Allergies  Current Outpatient Medications:  .  aspirin EC 81 MG tablet, Take 1 tablet (81 mg total) by mouth daily., Disp: 90 tablet, Rfl: 3 .  carvedilol (COREG) 6.25 MG tablet, TAKE 1 TABLET BY MOUTH 2 TIMES DAILY WITH A MEAL., Disp: 180 tablet, Rfl: 1 .  diclofenac Sodium (VOLTAREN) 1 % GEL, APPLY 2 GRAMS TO AFFECTED AREA 4 TIMES A DAY, Disp: 300 g, Rfl: 1 .  furosemide (LASIX) 20 MG tablet, Take 1 tablet (20 mg total) by mouth daily., Disp: 90 tablet, Rfl: 3 .  lisinopril (ZESTRIL) 5 MG tablet, Take 1 tablet (5 mg total) by mouth daily. Please make follow up appt at office [phone], Disp: 30 tablet, Rfl: 3 .  potassium chloride SA (KLOR-CON M20) 20 MEQ tablet, Take 1 tablet (20 mEq total) by mouth daily., Disp: 90 tablet, Rfl: 3 .  spironolactone  (ALDACTONE) 25 MG tablet, TAKE 1 TABLET (25 MG TOTAL) BY MOUTH DAILY. PLEASE MAKE FOLLOW UP APPT AT OFFICE [phone], Disp: 30 tablet, Rfl: 0 .  tamsulosin (FLOMAX) 0.4 MG CAPS capsule, TAKE 2 CAPSULES (0.8 MG TOTAL) BY MOUTH DAILY AFTER BREAKFAST., Disp: 180 capsule, Rfl: 1 .  warfarin (COUMADIN) 5 MG tablet, 1 TABLET BY MOUTH DAILY AT 6PM ALL DAYS OF WEEK EXCEPT TUES ONLY TAKE 1/2 TABLET, Disp: 72 tablet, Rfl: 1 Past Medical History:  Diagnosis Date  . Atrial fibrillation (HCC)    post op.  s/p dc-cv 04/2008. previously on amiodarone  . Atrial flutter (HCC)    atypical  . AV block, 1st degree    .450 msec--progressive  . 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  a- Echo 07/2008 Ef 55% mild peroprosthetic MVR with High transmitral gradient (mean 14).  b- normal coronaries by cath 12/2007  . BPH (benign prostatic hyperplasia)   . CHF (congestive heart failure) (HCC)    EF 35-40 % 2011 due to valvular disease and diastolic dysfunction  . Chronic back pain   . CKD (chronic kidney disease), stage III (HCC)   . Dysphagia    with normal barium swallow 09/2008  . Endocarditis   . GERD (gastroesophageal reflux disease)   . Heavy alcohol use    history  . History of GI bleed   . Hypertension   .  IV drug abuse ([place])    history of  . Lower GI bleed 01/2016  . Pacemaker 2010   Social History   Socioeconomic History  . Marital status: Divorced    Spouse name: Not on file  . Number of children: 42  . Years of education: Not on file  . Highest education level: Not on file  Occupational History  . Occupation: retired    Comment: [employer]  Tobacco Use  . Smoking status: Former Smoker    Years: 0.00    Types: Cigarettes  . Smokeless tobacco: Never Used  Substance and Sexual Activity  . Alcohol use: No    Alcohol/week: 0.0 standard drinks  . Drug use: No  . Sexual activity: Not Currently  Other Topics Concern  . Not on file  Social History  Narrative   Current Social History 02/16/2019        Patient lives with family (baby's 21, daughter and 4 grandkids:  34 yo twins, 63 yo and 65 yo in a home which is 1 story. There are 4 steps with handrails up to the entrance the patient uses.       Patient's method of transportation is via family member (baby's mama).      The highest level of education was high school diploma.      The patient currently retired.      Identified important Relationships are "My [address] Joselyn Arrow, [name]."       Pets : None       Interests / Fun: Sitting outdoors watching the birds and planes."       Exercise riding a bike 5 miles 3 days/week      Current Stressors: The grandkids       Religious / Personal Beliefs: "I believe in God."       L. Ducatte, RN, BSN       Social Determinants of Health   Financial Resource Strain:   . Difficulty of Paying Living Expenses: Not on file  Food Insecurity:   . Worried About Charity fundraiser in the Last Year: Not on file  . Ran Out of Food in the Last Year: Not on file  Transportation Needs:   . Lack of Transportation (Medical): Not on file  . Lack of Transportation (Non-Medical): Not on file  Physical Activity:   . Days of Exercise per Week: Not on file  . Minutes of Exercise per Session: Not on file  Stress:   . Feeling of Stress : Not on file  Social Connections:   . Frequency of Communication with Friends and Family: Not on file  . Frequency of Social Gatherings with Friends and Family: Not on file  . Attends Religious Services: Not on file  . Active Member of Clubs or Organizations: Not on file  . Attends Archivist Meetings: Not on file  . Marital Status: Not on file   Family History  Problem Relation Age of Onset  . Coronary artery disease Mother   . Cancer Father        GI  . Cancer Brother        Lung    ASSESSMENT Recent Results: The most recent result is correlated with 25 mg per week: Lab Results  Component  Value Date   INR 2.8 09/04/2019   INR 2.9 08/07/2019   INR 3.0 07/10/2019    Anticoagulation Dosing: Description   Take 1/2 tablet on Sundays, Mondays, Wednesdays and Fridays. All other days (  Tuesdays, Thursdays and Saturdays) take one (1) tablet.      INR today: Therapeutic  PLAN Weekly dose was unchanged   Patient Instructions  Patient instructed to take medications as defined in the Anti-coagulation Track section of this encounter.  Patient instructed to take today's dose.  Patient instructed to continue to take 1/2 tablet on Sundays, Mondays, Wednesdays and Fridays. All other days (Tuesdays, Thursdays and Saturdays) take one (1) tablet.  Patient verbalized understanding of these instructions.     Patient advised to contact clinic or seek medical attention if signs/symptoms of bleeding or thromboembolism occur.  Patient verbalized understanding by repeating back information and was advised to contact me if further medication-related questions arise. Patient was also provided an information handout.  Follow-up Return in 3 weeks (on 09/25/2019), or Follow-up INR.  Charlett Nose, PharmD  PGY1 Acute Care Pharmacy Resident  15 minutes spent face-to-face with the patient during the encounter. 50% of time spent on education, including signs/sx bleeding and clotting, as well as food and drug interactions with warfarin. 50% of time was spent on fingerprick POC INR sample collection,processing, results determination, and documentation in [email or web address].

## 2019-09-04 NOTE — Patient Instructions (Signed)
Patient instructed to take medications as defined in the Anti-coagulation Track section of this encounter.  Patient instructed to take today's dose.  Patient instructed to continue to take 1/2 tablet on Sundays, Mondays, Wednesdays and Fridays. All other days (Tuesdays, Thursdays and Saturdays) take one (1) tablet.  Patient verbalized understanding of these instructions.

## 2019-09-04 NOTE — Progress Notes (Signed)
   Covid-19 Vaccination Clinic  Name:  [name]    MRN: [id number] DOB: [birth year]  09/04/2019  [name] was observed post Covid-19 immunization for 15 minutes without incidence. He was provided with Vaccine Information Sheet and instruction to access the V-Safe system.   [name] was instructed to call 911 with any severe reactions post vaccine: Marland Kitchen Difficulty breathing  . Swelling of your face and throat  . A fast heartbeat  . A bad rash all over your body  . Dizziness and weakness    Immunizations Administered    Name Date Dose VIS Date Route   Pfizer COVID-19 Vaccine 09/04/2019  9:26 AM 0.3 mL 06/16/2019 Intramuscular   Manufacturer: [employer], [employer]   Lot: [id number]   NDC: [address]-8

## 2019-09-16 ENCOUNTER — Other Ambulatory Visit: Payer: Self-pay | Admitting: Internal Medicine

## 2019-09-16 DIAGNOSIS — I4892 Unspecified atrial flutter: Secondary | ICD-10-CM

## 2019-09-18 ENCOUNTER — Other Ambulatory Visit (HOSPITAL_COMMUNITY): Payer: Self-pay | Admitting: Internal Medicine

## 2019-09-18 DIAGNOSIS — I10 Essential (primary) hypertension: Secondary | ICD-10-CM

## 2019-09-25 ENCOUNTER — Ambulatory Visit (INDEPENDENT_AMBULATORY_CARE_PROVIDER_SITE_OTHER): Payer: Medicare Other | Admitting: Pharmacist

## 2019-09-25 DIAGNOSIS — Z5181 Encounter for therapeutic drug level monitoring: Secondary | ICD-10-CM | POA: Diagnosis not present

## 2019-09-25 DIAGNOSIS — Z952 Presence of prosthetic heart valve: Secondary | ICD-10-CM

## 2019-09-25 DIAGNOSIS — Z7901 Long term (current) use of anticoagulants: Secondary | ICD-10-CM | POA: Diagnosis not present

## 2019-09-25 LAB — POCT INR: INR: 2.5 (ref 2.0–3.0)

## 2019-09-25 NOTE — Progress Notes (Signed)
Anticoagulation Management [name] is a 74 y.o. male who reports to the clinic for monitoring of warfarin treatment.    Indication: History of mitral valve replacement; history of atrial flutter; long term current use of anticoagulant.   Duration: indefinite Supervising physician: Jessy Oto, MD, PhD  Anticoagulation Clinic Visit History: Patient does not report signs/symptoms of bleeding or thromboembolism  Other recent changes: No diet, medications, lifestyle changes.  Anticoagulation Episode Summary    Current INR goal:  2.5-3.5  TTR:  63.9 % (7 y)  Next INR check:  10/16/2019  INR from last check:  2.5 (09/25/2019)  Weekly max warfarin dose:    Target end date:  Indefinite  INR check location:  Anticoagulation Clinic  Preferred lab:    Send INR reminders to:  [facility] [address]   Indications   IRREGULAR PULSE [I49.9] Long term (current) use of anticoagulants (Resolved) [Z79.01] ATRIAL FLUTTER (Resolved) [I48.92] H/O mitral valve replacement with mechanical valve [Z95.2]       Comments:        Anticoagulation Care Providers    Provider Role Specialty Phone number   [name], MD  Cardiology [phone]      No Known Allergies  Current Outpatient Medications:  .  aspirin EC 81 MG tablet, Take 1 tablet (81 mg total) by mouth daily., Disp: 90 tablet, Rfl: 3 .  carvedilol (COREG) 6.25 MG tablet, TAKE 1 TABLET BY MOUTH 2 TIMES DAILY WITH A MEAL., Disp: 180 tablet, Rfl: 1 .  diclofenac Sodium (VOLTAREN) 1 % GEL, APPLY 2 GRAMS TO AFFECTED AREA 4 TIMES A DAY, Disp: 300 g, Rfl: 1 .  furosemide (LASIX) 20 MG tablet, Take 1 tablet (20 mg total) by mouth daily., Disp: 90 tablet, Rfl: 3 .  lisinopril (ZESTRIL) 5 MG tablet, Take 1 tablet (5 mg total) by mouth daily., Disp: 90 tablet, Rfl: 1 .  potassium chloride SA (KLOR-CON M20) 20 MEQ tablet, Take 1 tablet (20 mEq total) by mouth daily., Disp: 90 tablet, Rfl: 3 .  spironolactone (ALDACTONE) 25  MG tablet, TAKE 1 TABLET (25 MG TOTAL) BY MOUTH DAILY. PLEASE MAKE FOLLOW UP APPT AT OFFICE [phone], Disp: 30 tablet, Rfl: 0 .  tamsulosin (FLOMAX) 0.4 MG CAPS capsule, TAKE 2 CAPSULES (0.8 MG TOTAL) BY MOUTH DAILY AFTER BREAKFAST., Disp: 180 capsule, Rfl: 1 .  warfarin (COUMADIN) 5 MG tablet, 1 TABLET BY MOUTH DAILY AT 6PM ALL DAYS OF WEEK EXCEPT TUES ONLY TAKE 1/2 TABLET, Disp: 72 tablet, Rfl: 1 Past Medical History:  Diagnosis Date  . Atrial fibrillation (HCC)    post op.  s/p dc-cv 04/2008. previously on amiodarone  . Atrial flutter (HCC)    atypical  . AV block, 1st degree    .450 msec--progressive  . 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  a- Echo 07/2008 Ef 55% mild peroprosthetic MVR with High transmitral gradient (mean 14).  b- normal coronaries by cath 12/2007  . BPH (benign prostatic hyperplasia)   . CHF (congestive heart failure) (HCC)    EF 35-40 % 2011 due to valvular disease and diastolic dysfunction  . Chronic back pain   . CKD (chronic kidney disease), stage III (HCC)   . Dysphagia    with normal barium swallow 09/2008  . Endocarditis   . GERD (gastroesophageal reflux disease)   . Heavy alcohol use    history  . History of GI bleed   . Hypertension   .  IV drug abuse (HCC)    history of  . Lower GI bleed 01/2016  . Pacemaker 2010   Social History   Socioeconomic History  . Marital status: Divorced    Spouse name: Not on file  . Number of children: 9  . Years of education: Not on file  . Highest education level: Not on file  Occupational History  . Occupation: retired    Comment: [employer]  Tobacco Use  . Smoking status: Former Smoker    Years: 0.00    Types: Cigarettes  . Smokeless tobacco: Never Used  Substance and Sexual Activity  . Alcohol use: No    Alcohol/week: 0.0 standard drinks  . Drug use: No  . Sexual activity: Not Currently  Other Topics Concern  . Not on file  Social History Narrative    Current Social History 02/16/2019        Patient lives with family (baby's mama, daughter and 4 grandkids:  Two yo twins, 66 yo and 80 yo in a home which is 1 story. There are 4 steps with handrails up to the entrance the patient uses.       Patient's method of transportation is via family member (baby's mama).      The highest level of education was high school diploma.      The patient currently retired.      Identified important Relationships are "My Baby's [name], [name]."       Pets : None       Interests / Fun: Sitting outdoors watching the birds and planes."       Exercise riding a bike 5 miles 3 days/week      Current Stressors: The grandkids       Religious / Personal Beliefs: "I believe in God."       L. Ducatte, RN, BSN       Social Determinants of Health   Financial Resource Strain:   . Difficulty of Paying Living Expenses:   Food Insecurity:   . Worried About Programme researcher, broadcasting/film/video in the Last Year:   . Barista in the Last Year:   Transportation Needs:   . Freight forwarder (Medical):   Marland Kitchen Lack of Transportation (Non-Medical):   Physical Activity:   . Days of Exercise per Week:   . Minutes of Exercise per Session:   Stress:   . Feeling of Stress :   Social Connections:   . Frequency of Communication with Friends and Family:   . Frequency of Social Gatherings with Friends and Family:   . Attends Religious Services:   . Active Member of Clubs or Organizations:   . Attends Banker Meetings:   Marland Kitchen Marital Status:    Family History  Problem Relation Age of Onset  . Coronary artery disease Mother   . Cancer Father        GI  . Cancer Brother        Lung    ASSESSMENT Recent Results: The most recent result is correlated with 25 mg per week: Lab Results  Component Value Date   INR 2.5 09/25/2019   INR 2.8 09/04/2019   INR 2.9 08/07/2019    Anticoagulation Dosing: Description   Take 1/2 tablet on Sundays, Tuesdays, Thursdays.  All other days, take one (1) tablet.      INR today: Therapeutic  PLAN Weekly dose was increased by 10% to 27.5 mg per week  Patient Instructions  Patient  instructed to take medications as defined in the Anti-coagulation Track section of this encounter.  Patient instructed to take today's dose.  Patient instructed to take 1/2 tablet on Sundays, Tuesdays, Thursdays. All other days, take one (1) tablet.  Patient verbalized understanding of these instructions.    Patient advised to contact clinic or seek medical attention if signs/symptoms of bleeding or thromboembolism occur.  Patient verbalized understanding by repeating back information and was advised to contact me if further medication-related questions arise. Patient was also provided an information handout.  Follow-up Return in 3 weeks (on 10/16/2019) for Follow up INR.  Pennie Banter, PharmD, CPP  15 minutes spent face-to-face with the patient during the encounter. 50% of time spent on education, including signs/sx bleeding and clotting, as well as food and drug interactions with warfarin. 50% of time was spent on fingerprick POC INR sample collection,processing, results determination, and documentation in [email or web address]

## 2019-09-25 NOTE — Patient Instructions (Signed)
Patient instructed to take medications as defined in the Anti-coagulation Track section of this encounter.  Patient instructed to take today's dose.  Patient instructed to take 1/2 tablet on Sundays, Tuesdays, Thursdays. All other days, take one (1) tablet.  Patient verbalized understanding of these instructions.

## 2019-09-25 NOTE — Progress Notes (Signed)
INTERNAL MEDICINE TEACHING ATTENDING ADDENDUM  I agree with pharmacy recommendations as outlined in their note.   Kymberli Wiegand N Kaz Auld, MD  

## 2019-10-03 ENCOUNTER — Ambulatory Visit: Payer: Medicare Other | Attending: Internal Medicine

## 2019-10-03 DIAGNOSIS — Z23 Encounter for immunization: Secondary | ICD-10-CM

## 2019-10-03 NOTE — Progress Notes (Signed)
   Covid-19 Vaccination Clinic  Name:  [name]    MRN: [id number] DOB: 26-Apr-1946  10/03/2019  [name] was observed post Covid-19 immunization for 15 minutes without incident. He was provided with Vaccine Information Sheet and instruction to access the V-Safe system.   [name] was instructed to call 911 with any severe reactions post vaccine: Marland Kitchen Difficulty breathing  . Swelling of face and throat  . A fast heartbeat  . A bad rash all over body  . Dizziness and weakness   Immunizations Administered    Name Date Dose VIS Date Route   Pfizer COVID-19 Vaccine 10/03/2019  9:36 AM 0.3 mL 06/16/2019 Intramuscular   Manufacturer: [employer], [employer]   Lot: [id number]   NDC: [address]-5

## 2019-10-13 ENCOUNTER — Other Ambulatory Visit (HOSPITAL_COMMUNITY): Payer: Self-pay | Admitting: Internal Medicine

## 2019-10-16 ENCOUNTER — Ambulatory Visit (INDEPENDENT_AMBULATORY_CARE_PROVIDER_SITE_OTHER): Payer: Medicare Other

## 2019-10-16 DIAGNOSIS — Z7901 Long term (current) use of anticoagulants: Secondary | ICD-10-CM | POA: Diagnosis not present

## 2019-10-16 DIAGNOSIS — Z952 Presence of prosthetic heart valve: Secondary | ICD-10-CM

## 2019-10-16 DIAGNOSIS — Z5181 Encounter for therapeutic drug level monitoring: Secondary | ICD-10-CM

## 2019-10-16 LAB — POCT INR: INR: 1.8 — AB (ref 2.0–3.0)

## 2019-10-16 NOTE — Patient Instructions (Signed)
Patient instructed to take medications as defined in the Anti-coagulation Track section of this encounter.  Patient instructed to take today's dose.  Patient instructed to take 1/2 tablet on Tuesdays, Thursdays. All other days, take one (1) tablet.  Patient verbalized understanding of these instructions.

## 2019-10-16 NOTE — Progress Notes (Signed)
INTERNAL MEDICINE TEACHING ATTENDING ADDENDUM  I agree with pharmacy recommendations as outlined in their note.   Reisa Coppola N Tallin Hart, MD  

## 2019-10-16 NOTE — Progress Notes (Signed)
Anticoagulation Management [name] is a 74 y.o. male who reports to the clinic for monitoring of warfarin treatment.    Indication: mirtal valve regurgitation  Duration: indefinite Supervising physician: Jessy Oto   Anticoagulation Clinic Visit History: Patient does not report signs/symptoms of bleeding or thromboembolism  Other recent changes: no diet, medications, lifestyle changes  Anticoagulation Episode Summary    Current INR goal:  2.5-3.5  TTR:  63.4 % (7.1 y)  Next INR check:  10/16/2019  INR from last check:  1.8 (10/16/2019)  Weekly max warfarin dose:    Target end date:  Indefinite  INR check location:  Anticoagulation Clinic  Preferred lab:    Send INR reminders to:  [facility] [address]   Indications   IRREGULAR PULSE [I49.9] Long term (current) use of anticoagulants (Resolved) [Z79.01] ATRIAL FLUTTER (Resolved) [I48.92] H/O mitral valve replacement with mechanical valve [Z95.2]       Comments:        Anticoagulation Care Providers    Provider Role Specialty Phone number   [name], MD  Cardiology [phone]      No Known Allergies  Current Outpatient Medications:  .  aspirin EC 81 MG tablet, Take 1 tablet (81 mg total) by mouth daily., Disp: 90 tablet, Rfl: 3 .  carvedilol (COREG) 6.25 MG tablet, TAKE 1 TABLET BY MOUTH 2 TIMES DAILY WITH A MEAL., Disp: 180 tablet, Rfl: 1 .  diclofenac Sodium (VOLTAREN) 1 % GEL, APPLY 2 GRAMS TO AFFECTED AREA 4 TIMES A DAY, Disp: 300 g, Rfl: 1 .  furosemide (LASIX) 20 MG tablet, Take 1 tablet (20 mg total) by mouth daily., Disp: 90 tablet, Rfl: 3 .  lisinopril (ZESTRIL) 5 MG tablet, Take 1 tablet (5 mg total) by mouth daily., Disp: 90 tablet, Rfl: 1 .  potassium chloride SA (KLOR-CON M20) 20 MEQ tablet, Take 1 tablet (20 mEq total) by mouth daily., Disp: 90 tablet, Rfl: 3 .  spironolactone (ALDACTONE) 25 MG tablet, TAKE 1 TABLET (25 MG TOTAL) BY MOUTH DAILY. PLEASE MAKE FOLLOW UP APPT AT  OFFICE [phone], Disp: 90 tablet, Rfl: 0 .  tamsulosin (FLOMAX) 0.4 MG CAPS capsule, TAKE 2 CAPSULES (0.8 MG TOTAL) BY MOUTH DAILY AFTER BREAKFAST., Disp: 180 capsule, Rfl: 1 .  warfarin (COUMADIN) 5 MG tablet, 1 TABLET BY MOUTH DAILY AT 6PM ALL DAYS OF WEEK EXCEPT TUES ONLY TAKE 1/2 TABLET, Disp: 72 tablet, Rfl: 1 Past Medical History:  Diagnosis Date  . Atrial fibrillation (HCC)    post op.  s/p dc-cv 04/2008. previously on amiodarone  . Atrial flutter (HCC)    atypical  . AV block, 1st degree    .450 msec--progressive  . 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  a- Echo 07/2008 Ef 55% mild peroprosthetic MVR with High transmitral gradient (mean 14).  b- normal coronaries by cath 12/2007  . BPH (benign prostatic hyperplasia)   . CHF (congestive heart failure) (HCC)    EF 35-40 % 2011 due to valvular disease and diastolic dysfunction  . Chronic back pain   . CKD (chronic kidney disease), stage III (HCC)   . Dysphagia    with normal barium swallow 09/2008  . Endocarditis   . GERD (gastroesophageal reflux disease)   . Heavy alcohol use    history  . History of GI bleed   . Hypertension   . IV drug abuse (HCC)    history of  . Lower GI  bleed 01/2016  . Pacemaker 2010   Social History   Socioeconomic History  . Marital status: Divorced    Spouse name: Not on file  . Number of children: 29  . Years of education: Not on file  . Highest education level: Not on file  Occupational History  . Occupation: retired    Comment: [employer]  Tobacco Use  . Smoking status: Former Smoker    Years: 0.00    Types: Cigarettes  . Smokeless tobacco: Never Used  Substance and Sexual Activity  . Alcohol use: No    Alcohol/week: 0.0 standard drinks  . Drug use: No  . Sexual activity: Not Currently  Other Topics Concern  . Not on file  Social History Narrative   Current Social History 02/16/2019        Patient lives with family (baby's 32,  daughter and 4 grandkids:  81 yo twins, 35 yo and 54 yo in a home which is 1 story. There are 4 steps with handrails up to the entrance the patient uses.       Patient's method of transportation is via family member (baby's mama).      The highest level of education was high school diploma.      The patient currently retired.      Identified important Relationships are "My 87 Joselyn Arrow, [name]."       Pets : None       Interests / Fun: Sitting outdoors watching the birds and planes."       Exercise riding a bike 5 miles 3 days/week      Current Stressors: The grandkids       Religious / Personal Beliefs: "I believe in God."       L. Ducatte, RN, BSN       Social Determinants of Health   Financial Resource Strain:   . Difficulty of Paying Living Expenses:   Food Insecurity:   . Worried About Charity fundraiser in the Last Year:   . Arboriculturist in the Last Year:   Transportation Needs:   . Film/video editor (Medical):   Marland Kitchen Lack of Transportation (Non-Medical):   Physical Activity:   . Days of Exercise per Week:   . Minutes of Exercise per Session:   Stress:   . Feeling of Stress :   Social Connections:   . Frequency of Communication with Friends and Family:   . Frequency of Social Gatherings with Friends and Family:   . Attends Religious Services:   . Active Member of Clubs or Organizations:   . Attends Archivist Meetings:   Marland Kitchen Marital Status:    Family History  Problem Relation Age of Onset  . Coronary artery disease Mother   . Cancer Father        GI  . Cancer Brother        Lung    ASSESSMENT Recent Results: The most recent result is correlated with 27.5 mg per week: Lab Results  Component Value Date   INR 1.8 (A) 10/16/2019   INR 2.5 09/25/2019   INR 2.8 09/04/2019    Anticoagulation Dosing: Description   Take 1/2 tablet on Tuesdays, Thursdays. All other days, take one (1) tablet.      INR today:  Subtherapeutic  PLAN Weekly dose was increased by 9% to 30 mg per week  Patient Instructions  Patient instructed to take medications as defined in the Anti-coagulation Track section of this  encounter.  Patient instructed to take today's dose.  Patient instructed to take 1/2 tablet on Tuesdays, Thursdays. All other days, take one (1) tablet.  Patient verbalized understanding of these instructions.     Patient advised to contact clinic or seek medical attention if signs/symptoms of bleeding or thromboembolism occur.  Patient verbalized understanding by repeating back information and was advised to contact me if further medication-related questions arise. Patient was also provided an information handout.  Follow-up Return in 3 weeks (on 11/06/2019) for Follow-up INR . Patient was instructed if he had any concerns prior to appoint to call clinic and he could be seen sooner.   Charlett Nose, PharmD  PGY1 Acute Care Pharmacy Resident  15 minutes spent face-to-face with the patient during the encounter. 50% of time spent on education, including signs/sx bleeding and clotting, as well as food and drug interactions with warfarin. 50% of time was spent on fingerprick POC INR sample collection,processing, results determination, and documentation in [email or web address].

## 2019-11-06 ENCOUNTER — Ambulatory Visit (INDEPENDENT_AMBULATORY_CARE_PROVIDER_SITE_OTHER): Payer: Medicare Other | Admitting: Pharmacist

## 2019-11-06 DIAGNOSIS — Z952 Presence of prosthetic heart valve: Secondary | ICD-10-CM

## 2019-11-06 DIAGNOSIS — Z7901 Long term (current) use of anticoagulants: Secondary | ICD-10-CM

## 2019-11-06 LAB — POCT INR: INR: 2.2 (ref 2.0–3.0)

## 2019-11-06 NOTE — Progress Notes (Signed)
Anticoagulation Management [name] is a 74 y.o. male who reports to the clinic for monitoring of warfarin treatment.    Indication: Long term current use of anticoagulant warfarin with INR 2.5 - 3.5 for MVR.  Duration: indefinite Supervising physician: Blanch Media  Anticoagulation Clinic Visit History: Patient does not report signs/symptoms of bleeding or thromboembolism  Other recent changes: No diet, medications, lifestyle changes endorsed by the patient.  Anticoagulation Episode Summary    Current INR goal:  2.5-3.5  TTR:  62.9 % (7.1 y)  Next INR check:  11/27/2019  INR from last check:  2.2 (11/06/2019)  Weekly max warfarin dose:    Target end date:  Indefinite  INR check location:  Anticoagulation Clinic  Preferred lab:    Send INR reminders to:  [facility] [address]   Indications   IRREGULAR PULSE [I49.9] Long term (current) use of anticoagulants (Resolved) [Z79.01] ATRIAL FLUTTER (Resolved) [I48.92] H/O mitral valve replacement with mechanical valve [Z95.2]       Comments:        Anticoagulation Care Providers    Provider Role Specialty Phone number   [name], MD  Cardiology [phone]      No Known Allergies  Current Outpatient Medications:  .  aspirin EC 81 MG tablet, Take 1 tablet (81 mg total) by mouth daily., Disp: 90 tablet, Rfl: 3 .  carvedilol (COREG) 6.25 MG tablet, TAKE 1 TABLET BY MOUTH 2 TIMES DAILY WITH A MEAL., Disp: 180 tablet, Rfl: 1 .  diclofenac Sodium (VOLTAREN) 1 % GEL, APPLY 2 GRAMS TO AFFECTED AREA 4 TIMES A DAY, Disp: 300 g, Rfl: 1 .  furosemide (LASIX) 20 MG tablet, Take 1 tablet (20 mg total) by mouth daily., Disp: 90 tablet, Rfl: 3 .  lisinopril (ZESTRIL) 5 MG tablet, Take 1 tablet (5 mg total) by mouth daily., Disp: 90 tablet, Rfl: 1 .  potassium chloride SA (KLOR-CON M20) 20 MEQ tablet, Take 1 tablet (20 mEq total) by mouth daily., Disp: 90 tablet, Rfl: 3 .  spironolactone (ALDACTONE) 25 MG tablet,  TAKE 1 TABLET (25 MG TOTAL) BY MOUTH DAILY. PLEASE MAKE FOLLOW UP APPT AT OFFICE [phone], Disp: 90 tablet, Rfl: 0 .  tamsulosin (FLOMAX) 0.4 MG CAPS capsule, TAKE 2 CAPSULES (0.8 MG TOTAL) BY MOUTH DAILY AFTER BREAKFAST., Disp: 180 capsule, Rfl: 1 .  warfarin (COUMADIN) 5 MG tablet, 1 TABLET BY MOUTH DAILY AT 6PM ALL DAYS OF WEEK EXCEPT TUES ONLY TAKE 1/2 TABLET, Disp: 72 tablet, Rfl: 1 Past Medical History:  Diagnosis Date  . Atrial fibrillation (HCC)    post op.  s/p dc-cv 04/2008. previously on amiodarone  . Atrial flutter (HCC)    atypical  . AV block, 1st degree    .450 msec--progressive  . 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  a- Echo 07/2008 Ef 55% mild peroprosthetic MVR with High transmitral gradient (mean 14).  b- normal coronaries by cath 12/2007  . BPH (benign prostatic hyperplasia)   . CHF (congestive heart failure) (HCC)    EF 35-40 % 2011 due to valvular disease and diastolic dysfunction  . Chronic back pain   . CKD (chronic kidney disease), stage III (HCC)   . Dysphagia    with normal barium swallow 09/2008  . Endocarditis   . GERD (gastroesophageal reflux disease)   . Heavy alcohol use    history  . History of GI bleed   . Hypertension   .  IV drug abuse ([place])    history of  . Lower GI bleed 01/2016  . Pacemaker 2010   Social History   Socioeconomic History  . Marital status: Divorced    Spouse name: Not on file  . Number of children: 68  . Years of education: Not on file  . Highest education level: Not on file  Occupational History  . Occupation: retired    Comment: [employer]  Tobacco Use  . Smoking status: Former Smoker    Years: 0.00    Types: Cigarettes  . Smokeless tobacco: Never Used  Substance and Sexual Activity  . Alcohol use: No    Alcohol/week: 0.0 standard drinks  . Drug use: No  . Sexual activity: Not Currently  Other Topics Concern  . Not on file  Social History Narrative   Current Social  History 02/16/2019        Patient lives with family (baby's 7, daughter and 4 grandkids:  62 yo twins, 19 yo and 21 yo in a home which is 1 story. There are 4 steps with handrails up to the entrance the patient uses.       Patient's method of transportation is via family member (baby's mama).      The highest level of education was high school diploma.      The patient currently retired.      Identified important Relationships are "My 72 Joselyn Arrow, [name]."       Pets : None       Interests / Fun: Sitting outdoors watching the birds and planes."       Exercise riding a bike 5 miles 3 days/week      Current Stressors: The grandkids       Religious / Personal Beliefs: "I believe in God."       L. Ducatte, RN, BSN       Social Determinants of Health   Financial Resource Strain:   . Difficulty of Paying Living Expenses:   Food Insecurity:   . Worried About Charity fundraiser in the Last Year:   . Arboriculturist in the Last Year:   Transportation Needs:   . Film/video editor (Medical):   Marland Kitchen Lack of Transportation (Non-Medical):   Physical Activity:   . Days of Exercise per Week:   . Minutes of Exercise per Session:   Stress:   . Feeling of Stress :   Social Connections:   . Frequency of Communication with Friends and Family:   . Frequency of Social Gatherings with Friends and Family:   . Attends Religious Services:   . Active Member of Clubs or Organizations:   . Attends Archivist Meetings:   Marland Kitchen Marital Status:    Family History  Problem Relation Age of Onset  . Coronary artery disease Mother   . Cancer Father        GI  . Cancer Brother        Lung    ASSESSMENT Recent Results: The most recent result is correlated with 30 mg per week: Lab Results  Component Value Date   INR 2.2 11/06/2019   INR 1.8 (A) 10/16/2019   INR 2.5 09/25/2019    Anticoagulation Dosing: Description   Take 1/2 tablet on Sundays. All other days, take one (1)  tablet.      INR today: Subtherapeutic  PLAN Weekly dose was increased by 8% to 32.5 mg per week  Patient Instructions  Patient instructed  to take medications as defined in the Anti-coagulation Track section of this encounter.  Patient instructed to take today's dose.  Patient instructed to take 1/2 tablet on Sundays. All other days, take one (1) tablet.  Patient verbalized understanding of these instructions.    Patient advised to contact clinic or seek medical attention if signs/symptoms of bleeding or thromboembolism occur.  Patient verbalized understanding by repeating back information and was advised to contact me if further medication-related questions arise. Patient was also provided an information handout.  Follow-up Return in 3 weeks (on 11/27/2019) for Follow up INR.  Elicia Lamp, PharmD, CPP  15 minutes spent face-to-face with the patient during the encounter. 50% of time spent on education, including signs/sx bleeding and clotting, as well as food and drug interactions with warfarin. 50% of time was spent on fingerprick POC INR sample collection,processing, results determination, and documentation in [email or web address].

## 2019-11-06 NOTE — Patient Instructions (Signed)
Patient instructed to take medications as defined in the Anti-coagulation Track section of this encounter.  Patient instructed to take today's dose.  Patient instructed to take 1/2 tablet on Sundays. All other days, take one (1) tablet.  Patient verbalized understanding of these instructions.

## 2019-11-27 ENCOUNTER — Ambulatory Visit (INDEPENDENT_AMBULATORY_CARE_PROVIDER_SITE_OTHER): Payer: Medicare Other | Admitting: Pharmacist

## 2019-11-27 DIAGNOSIS — Z952 Presence of prosthetic heart valve: Secondary | ICD-10-CM | POA: Diagnosis not present

## 2019-11-27 LAB — POCT INR: INR: 3.5 — AB (ref 2.0–3.0)

## 2019-11-27 NOTE — Progress Notes (Signed)
Anticoagulation Management [name] is a 74 y.o. male who reports to the clinic for monitoring of warfarin treatment.    Indication: History of mitral valve replacement; Long term current use of anticoagulant.    Duration: indefinite Supervising physician: Aldine Contes  Anticoagulation Clinic Visit History: Patient does not report signs/symptoms of bleeding or thromboembolism  Other recent changes: No diet, medications, lifestyle changes endorsed by the patient.  Anticoagulation Episode Summary    Current INR goal:  2.5-3.5  TTR:  63.0 % (7.2 y)  Next INR check:  12/18/2019  INR from last check:  3.5 (11/27/2019)  Weekly max warfarin dose:    Target end date:  Indefinite  INR check location:  Anticoagulation Clinic  Preferred lab:    Send INR reminders to:  [facility] [place] [facility]   Indications   IRREGULAR PULSE [I49.9] Long term (current) use of anticoagulants (Resolved) [Z79.01] ATRIAL FLUTTER (Resolved) [I48.92] H/O mitral valve replacement with mechanical valve [Z95.2]       Comments:        Anticoagulation Care Providers    Provider Role Specialty Phone number   Jolaine Artist, MD  Cardiology [phone]      No Known Allergies  Current Outpatient Medications:  .  aspirin EC 81 MG tablet, Take 1 tablet (81 mg total) by mouth daily., Disp: 90 tablet, Rfl: 3 .  carvedilol (COREG) 6.25 MG tablet, TAKE 1 TABLET BY MOUTH 2 TIMES DAILY WITH A MEAL., Disp: 180 tablet, Rfl: 1 .  diclofenac Sodium (VOLTAREN) 1 % GEL, APPLY 2 GRAMS TO AFFECTED AREA 4 TIMES A DAY, Disp: 300 g, Rfl: 1 .  furosemide (LASIX) 20 MG tablet, Take 1 tablet (20 mg total) by mouth daily., Disp: 90 tablet, Rfl: 3 .  lisinopril (ZESTRIL) 5 MG tablet, Take 1 tablet (5 mg total) by mouth daily., Disp: 90 tablet, Rfl: 1 .  potassium chloride SA (KLOR-CON M20) 20 MEQ tablet, Take 1 tablet (20 mEq total) by mouth daily., Disp: 90 tablet, Rfl: 3 .  spironolactone (ALDACTONE) 25 MG tablet,  TAKE 1 TABLET (25 MG TOTAL) BY MOUTH DAILY. PLEASE MAKE FOLLOW UP APPT AT OFFICE [phone], Disp: 90 tablet, Rfl: 0 .  tamsulosin (FLOMAX) 0.4 MG CAPS capsule, TAKE 2 CAPSULES (0.8 MG TOTAL) BY MOUTH DAILY AFTER BREAKFAST., Disp: 180 capsule, Rfl: 1 .  warfarin (COUMADIN) 5 MG tablet, 1 TABLET BY MOUTH DAILY AT 6PM ALL DAYS OF WEEK EXCEPT TUES ONLY TAKE 1/2 TABLET, Disp: 72 tablet, Rfl: 1 Past Medical History:  Diagnosis Date  . Atrial fibrillation ([place])    post op.  s/p dc-cv 04/2008. previously on amiodarone  . Atrial flutter ([place])    atypical  . AV block, 1st degree    .450 msec--progressive  . 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  a- Echo 07/2008 Ef 55% mild peroprosthetic MVR with High transmitral gradient (mean 14).  b- normal coronaries by cath 12/2007  . BPH (benign prostatic hyperplasia)   . CHF (congestive heart failure) (HCC)    EF 35-40 % 2011 due to valvular disease and diastolic dysfunction  . Chronic back pain   . CKD (chronic kidney disease), stage III ([place])   . Dysphagia    with normal barium swallow 09/2008  . Endocarditis   . GERD (gastroesophageal reflux disease)   . Heavy alcohol use    history  . History of GI bleed   . Hypertension   .  IV drug abuse (HCC)    history of  . Lower GI bleed 01/2016  . Pacemaker 2010   Social History   Socioeconomic History  . Marital status: Divorced    Spouse name: Not on file  . Number of children: 9  . Years of education: Not on file  . Highest education level: Not on file  Occupational History  . Occupation: retired    Comment: [employer]  Tobacco Use  . Smoking status: Former Smoker    Years: 0.00    Types: Cigarettes  . Smokeless tobacco: Never Used  Substance and Sexual Activity  . Alcohol use: No    Alcohol/week: 0.0 standard drinks  . Drug use: No  . Sexual activity: Not Currently  Other Topics Concern  . Not on file  Social History Narrative   Current Social  History 02/16/2019        Patient lives with family (baby's mama, daughter and 4 grandkids:  Two yo twins, 35 yo and 76 yo in a home which is 1 story. There are 4 steps with handrails up to the entrance the patient uses.       Patient's method of transportation is via family member (baby's mama).      The highest level of education was high school diploma.      The patient currently retired.      Identified important Relationships are "My Baby's [name], [name]."       Pets : None       Interests / Fun: Sitting outdoors watching the birds and planes."       Exercise riding a bike 5 miles 3 days/week      Current Stressors: The grandkids       Religious / Personal Beliefs: "I believe in God."       L. Ducatte, RN, BSN       Social Determinants of Health   Financial Resource Strain:   . Difficulty of Paying Living Expenses:   Food Insecurity:   . Worried About Programme researcher, broadcasting/film/video in the Last Year:   . Barista in the Last Year:   Transportation Needs:   . Freight forwarder (Medical):   Marland Kitchen Lack of Transportation (Non-Medical):   Physical Activity:   . Days of Exercise per Week:   . Minutes of Exercise per Session:   Stress:   . Feeling of Stress :   Social Connections:   . Frequency of Communication with Friends and Family:   . Frequency of Social Gatherings with Friends and Family:   . Attends Religious Services:   . Active Member of Clubs or Organizations:   . Attends Banker Meetings:   Marland Kitchen Marital Status:    Family History  Problem Relation Age of Onset  . Coronary artery disease Mother   . Cancer Father        GI  . Cancer Brother        Lung    ASSESSMENT Recent Results: The most recent result is correlated with 32.5 mg per week: Lab Results  Component Value Date   INR 3.5 (A) 11/27/2019   INR 2.2 11/06/2019   INR 1.8 (A) 10/16/2019    Anticoagulation Dosing: Description   Take 1/2 tablet on Mondays and Thursdays. All other  days, take one (1) tablet.      INR today: Therapeutic  PLAN Weekly dose was decreased by 8% to 30 mg per week  Patient Instructions  Patient instructed to take medications as defined in the Anti-coagulation Track section of this encounter.  Patient instructed to take today's dose.  Patient instructed to take 1/2 tablet on Mondays and Thursdays. All other days, take one (1) tablet.  Patient verbalized understanding of these instructions.    Patient advised to contact clinic or seek medical attention if signs/symptoms of bleeding or thromboembolism occur.  Patient verbalized understanding by repeating back information and was advised to contact me if further medication-related questions arise. Patient was also provided an information handout.  Follow-up Return in 3 weeks (on 12/18/2019) for Follow up INR.  Elicia Lamp , PharmD, CPP 15 minutes spent face-to-face with the patient during the encounter. 50% of time spent on education, including signs/sx bleeding and clotting, as well as food and drug interactions with warfarin. 50% of time was spent on fingerprick POC INR sample collection,processing, results determination, and documentation in [email or web address].

## 2019-11-27 NOTE — Progress Notes (Signed)
INTERNAL MEDICINE TEACHING ATTENDING ADDENDUM - Winton Offord M.D  Duration-indefinite, Indication-status post mechanical mitral valve, INR-therapeutic. Agree with pharmacy recommendations as outlined in their note.

## 2019-11-27 NOTE — Patient Instructions (Signed)
Patient instructed to take medications as defined in the Anti-coagulation Track section of this encounter.  Patient instructed to take today's dose.  Patient instructed to take 1/2 tablet on Mondays and Thursdays. All other days, take one (1) tablet. Patient verbalized understanding of these instructions.   

## 2019-11-28 ENCOUNTER — Ambulatory Visit (INDEPENDENT_AMBULATORY_CARE_PROVIDER_SITE_OTHER): Payer: Medicare Other | Admitting: *Deleted

## 2019-11-28 DIAGNOSIS — I4819 Other persistent atrial fibrillation: Secondary | ICD-10-CM

## 2019-11-28 DIAGNOSIS — I5022 Chronic systolic (congestive) heart failure: Secondary | ICD-10-CM | POA: Diagnosis not present

## 2019-11-29 LAB — CUP PACEART REMOTE DEVICE CHECK
Battery Remaining Longevity: 98 mo
Battery Remaining Percentage: 95.5 %
Battery Voltage: 2.99 V
Date Time Interrogation Session: 20210526112526
Implantable Lead Implant Date: 20120905
Implantable Lead Implant Date: 20120905
Implantable Lead Implant Date: 20120905
Implantable Lead Location: 753858
Implantable Lead Location: 753859
Implantable Lead Location: 753860
Implantable Pulse Generator Implant Date: 20190610
Lead Channel Impedance Value: 400 Ohm
Lead Channel Impedance Value: 740 Ohm
Lead Channel Pacing Threshold Amplitude: 1.125 V
Lead Channel Pacing Threshold Amplitude: 1.25 V
Lead Channel Pacing Threshold Pulse Width: 0.5 ms
Lead Channel Pacing Threshold Pulse Width: 0.6 ms
Lead Channel Sensing Intrinsic Amplitude: 12 mV
Lead Channel Setting Pacing Amplitude: 2.125
Lead Channel Setting Pacing Amplitude: 2.25 V
Lead Channel Setting Pacing Pulse Width: 0.5 ms
Lead Channel Setting Pacing Pulse Width: 0.6 ms
Lead Channel Setting Sensing Sensitivity: 2 mV
Pulse Gen Model: 3222
Pulse Gen Serial Number: 9022287

## 2019-11-30 NOTE — Progress Notes (Signed)
Remote pacemaker transmission.   

## 2019-12-18 ENCOUNTER — Ambulatory Visit (INDEPENDENT_AMBULATORY_CARE_PROVIDER_SITE_OTHER): Payer: Medicare Other | Admitting: Pharmacist

## 2019-12-18 DIAGNOSIS — I4892 Unspecified atrial flutter: Secondary | ICD-10-CM

## 2019-12-18 DIAGNOSIS — Z952 Presence of prosthetic heart valve: Secondary | ICD-10-CM | POA: Diagnosis not present

## 2019-12-18 DIAGNOSIS — Z7901 Long term (current) use of anticoagulants: Secondary | ICD-10-CM | POA: Diagnosis not present

## 2019-12-18 DIAGNOSIS — I499 Cardiac arrhythmia, unspecified: Secondary | ICD-10-CM

## 2019-12-18 LAB — POCT INR: INR: 3.3 — AB (ref 2.0–3.0)

## 2019-12-18 NOTE — Progress Notes (Signed)
Anticoagulation Management [name] is a 74 y.o. male who reports to the clinic for monitoring of warfarin treatment.    Indication: Irregular Pulse; Atrial Flutter (history of; resolved); long term current use of anticoagulant.    Duration: indefinite Supervising physician: Carlynn Purl  Anticoagulation Clinic Visit History: Patient does not report signs/symptoms of bleeding or thromboembolism  Other recent changes: No diet, medications, lifestyle changes.  Anticoagulation Episode Summary    Current INR goal:  2.5-3.5  TTR:  63.3 % (7.3 y)  Next INR check:  01/15/2020  INR from last check:  3.3 (12/18/2019)  Weekly max warfarin dose:    Target end date:  Indefinite  INR check location:  Anticoagulation Clinic  Preferred lab:    Send INR reminders to:  [facility] [address]   Indications   IRREGULAR PULSE [I49.9] Long term (current) use of anticoagulants (Resolved) [Z79.01] ATRIAL FLUTTER (Resolved) [I48.92] H/O mitral valve replacement with mechanical valve [Z95.2]       Comments:        Anticoagulation Care Providers    Provider Role Specialty Phone number   [name], MD  Cardiology [phone]      No Known Allergies  Current Outpatient Medications:  .  aspirin EC 81 MG tablet, Take 1 tablet (81 mg total) by mouth daily., Disp: 90 tablet, Rfl: 3 .  carvedilol (COREG) 6.25 MG tablet, TAKE 1 TABLET BY MOUTH 2 TIMES DAILY WITH A MEAL., Disp: 180 tablet, Rfl: 1 .  diclofenac Sodium (VOLTAREN) 1 % GEL, APPLY 2 GRAMS TO AFFECTED AREA 4 TIMES A DAY, Disp: 300 g, Rfl: 1 .  furosemide (LASIX) 20 MG tablet, Take 1 tablet (20 mg total) by mouth daily., Disp: 90 tablet, Rfl: 3 .  lisinopril (ZESTRIL) 5 MG tablet, Take 1 tablet (5 mg total) by mouth daily., Disp: 90 tablet, Rfl: 1 .  potassium chloride SA (KLOR-CON M20) 20 MEQ tablet, Take 1 tablet (20 mEq total) by mouth daily., Disp: 90 tablet, Rfl: 3 .  spironolactone (ALDACTONE) 25 MG tablet, TAKE 1  TABLET (25 MG TOTAL) BY MOUTH DAILY. PLEASE MAKE FOLLOW UP APPT AT OFFICE [phone], Disp: 90 tablet, Rfl: 0 .  tamsulosin (FLOMAX) 0.4 MG CAPS capsule, TAKE 2 CAPSULES (0.8 MG TOTAL) BY MOUTH DAILY AFTER BREAKFAST., Disp: 180 capsule, Rfl: 1 .  warfarin (COUMADIN) 5 MG tablet, 1 TABLET BY MOUTH DAILY AT 6PM ALL DAYS OF WEEK EXCEPT TUES ONLY TAKE 1/2 TABLET, Disp: 72 tablet, Rfl: 1 Past Medical History:  Diagnosis Date  . Atrial fibrillation (HCC)    post op.  s/p dc-cv 04/2008. previously on amiodarone  . Atrial flutter (HCC)    atypical  . AV block, 1st degree    .450 msec--progressive  . 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  a- Echo 07/2008 Ef 55% mild peroprosthetic MVR with High transmitral gradient (mean 14).  b- normal coronaries by cath 12/2007  . BPH (benign prostatic hyperplasia)   . CHF (congestive heart failure) (HCC)    EF 35-40 % 2011 due to valvular disease and diastolic dysfunction  . Chronic back pain   . CKD (chronic kidney disease), stage III (HCC)   . Dysphagia    with normal barium swallow 09/2008  . Endocarditis   . GERD (gastroesophageal reflux disease)   . Heavy alcohol use    history  . History of GI bleed   . Hypertension   . IV drug  abuse ([place])    history of  . Lower GI bleed 01/2016  . Pacemaker 2010   Social History   Socioeconomic History  . Marital status: Divorced    Spouse name: Not on file  . Number of children: 22  . Years of education: Not on file  . Highest education level: Not on file  Occupational History  . Occupation: retired    Comment: [employer]  Tobacco Use  . Smoking status: Former Smoker    Years: 0.00    Types: Cigarettes  . Smokeless tobacco: Never Used  Vaping Use  . Vaping Use: Never used  Substance and Sexual Activity  . Alcohol use: No    Alcohol/week: 0.0 standard drinks  . Drug use: No  . Sexual activity: Not Currently  Other Topics Concern  . Not on file  Social  History Narrative   Current Social History 02/16/2019        Patient lives with family (baby's 76, daughter and 4 grandkids:  11 yo twins, 82 yo and 28 yo in a home which is 1 story. There are 4 steps with handrails up to the entrance the patient uses.       Patient's method of transportation is via family member (baby's mama).      The highest level of education was high school diploma.      The patient currently retired.      Identified important Relationships are "My 20 Joselyn Arrow, [name]."       Pets : None       Interests / Fun: Sitting outdoors watching the birds and planes."       Exercise riding a bike 5 miles 3 days/week      Current Stressors: The grandkids       Religious / Personal Beliefs: "I believe in God."       L. Ducatte, RN, BSN       Social Determinants of Health   Financial Resource Strain:   . Difficulty of Paying Living Expenses:   Food Insecurity:   . Worried About Charity fundraiser in the Last Year:   . Arboriculturist in the Last Year:   Transportation Needs:   . Film/video editor (Medical):   Marland Kitchen Lack of Transportation (Non-Medical):   Physical Activity:   . Days of Exercise per Week:   . Minutes of Exercise per Session:   Stress:   . Feeling of Stress :   Social Connections:   . Frequency of Communication with Friends and Family:   . Frequency of Social Gatherings with Friends and Family:   . Attends Religious Services:   . Active Member of Clubs or Organizations:   . Attends Archivist Meetings:   Marland Kitchen Marital Status:    Family History  Problem Relation Age of Onset  . Coronary artery disease Mother   . Cancer Father        GI  . Cancer Brother        Lung    ASSESSMENT Recent Results: The most recent result is correlated with 30 mg per week: Lab Results  Component Value Date   INR 3.3 (A) 12/18/2019   INR 3.5 (A) 11/27/2019   INR 2.2 11/06/2019    Anticoagulation Dosing: Description   Take 1/2 tablet on  Mondays and Thursdays. All other days, take one (1) tablet.      INR today: Therapeutic  PLAN Weekly dose was unchanged.   Patient  Instructions  Patient instructed to take medications as defined in the Anti-coagulation Track section of this encounter.  Patient instructed to take today's dose.  Patient instructed to take 1/2 tablet on Mondays and Thursdays. All other days, take one (1) tablet.  Patient verbalized understanding of these instructions.    Patient advised to contact clinic or seek medical attention if signs/symptoms of bleeding or thromboembolism occur.  Patient verbalized understanding by repeating back information and was advised to contact me if further medication-related questions arise. Patient was also provided an information handout.  Follow-up Return in 4 weeks (on 01/15/2020) for Follow up INR.  Elicia Lamp, PharmD, CPP  15 minutes spent face-to-face with the patient during the encounter. 50% of time spent on education, including signs/sx bleeding and clotting, as well as food and drug interactions with warfarin. 50% of time was spent on fingerprick POC INR sample collection,processing, results determination, and documentation in [email or web address].

## 2019-12-18 NOTE — Patient Instructions (Signed)
Patient instructed to take medications as defined in the Anti-coagulation Track section of this encounter.  Patient instructed to take today's dose.  Patient instructed to take 1/2 tablet on Mondays and Thursdays. All other days, take one (1) tablet. Patient verbalized understanding of these instructions.   

## 2019-12-21 ENCOUNTER — Other Ambulatory Visit (HOSPITAL_COMMUNITY): Payer: Self-pay | Admitting: Internal Medicine

## 2019-12-21 DIAGNOSIS — I10 Essential (primary) hypertension: Secondary | ICD-10-CM

## 2019-12-28 ENCOUNTER — Other Ambulatory Visit: Payer: Self-pay | Admitting: Internal Medicine

## 2019-12-28 DIAGNOSIS — R351 Nocturia: Secondary | ICD-10-CM

## 2020-01-04 ENCOUNTER — Other Ambulatory Visit: Payer: Self-pay

## 2020-01-04 ENCOUNTER — Ambulatory Visit (INDEPENDENT_AMBULATORY_CARE_PROVIDER_SITE_OTHER): Payer: Medicare Other | Admitting: Student

## 2020-01-04 ENCOUNTER — Encounter: Payer: Self-pay | Admitting: Student

## 2020-01-04 VITALS — BP 98/68 | HR 70 | Wt 178.0 lb

## 2020-01-04 DIAGNOSIS — Z952 Presence of prosthetic heart valve: Secondary | ICD-10-CM | POA: Diagnosis not present

## 2020-01-04 DIAGNOSIS — Z950 Presence of cardiac pacemaker: Secondary | ICD-10-CM

## 2020-01-04 DIAGNOSIS — I4819 Other persistent atrial fibrillation: Secondary | ICD-10-CM

## 2020-01-04 DIAGNOSIS — I5022 Chronic systolic (congestive) heart failure: Secondary | ICD-10-CM | POA: Diagnosis not present

## 2020-01-04 DIAGNOSIS — I959 Hypotension, unspecified: Secondary | ICD-10-CM

## 2020-01-04 LAB — CUP PACEART INCLINIC DEVICE CHECK
Battery Remaining Longevity: 94 mo
Battery Voltage: 2.99 V
Brady Statistic RA Percent Paced: 0 %
Brady Statistic RV Percent Paced: 77 %
Date Time Interrogation Session: 20210701104641
Implantable Lead Implant Date: 20120905
Implantable Lead Implant Date: 20120905
Implantable Lead Implant Date: 20120905
Implantable Lead Location: 753858
Implantable Lead Location: 753859
Implantable Lead Location: 753860
Implantable Pulse Generator Implant Date: 20190610
Lead Channel Impedance Value: 412.5 Ohm
Lead Channel Impedance Value: 700 Ohm
Lead Channel Pacing Threshold Amplitude: 1 V
Lead Channel Pacing Threshold Amplitude: 1.5 V
Lead Channel Pacing Threshold Amplitude: 1.5 V
Lead Channel Pacing Threshold Pulse Width: 0.5 ms
Lead Channel Pacing Threshold Pulse Width: 0.6 ms
Lead Channel Pacing Threshold Pulse Width: 0.6 ms
Lead Channel Sensing Intrinsic Amplitude: 12 mV
Lead Channel Setting Pacing Amplitude: 2 V
Lead Channel Setting Pacing Amplitude: 2.5 V
Lead Channel Setting Pacing Pulse Width: 0.5 ms
Lead Channel Setting Pacing Pulse Width: 0.6 ms
Lead Channel Setting Sensing Sensitivity: 2 mV
Pulse Gen Model: 3222
Pulse Gen Serial Number: 9022287

## 2020-01-04 LAB — CBC WITH DIFFERENTIAL/PLATELET
Basophils Absolute: 0.1 10*3/uL (ref 0.0–0.2)
Basos: 1 %
EOS (ABSOLUTE): 0.1 10*3/uL (ref 0.0–0.4)
Eos: 3 %
Hematocrit: 48.8 % (ref 37.5–51.0)
Hemoglobin: 16.4 g/dL (ref 13.0–17.7)
Immature Grans (Abs): 0 10*3/uL (ref 0.0–0.1)
Immature Granulocytes: 0 %
Lymphocytes Absolute: 1.6 10*3/uL (ref 0.7–3.1)
Lymphs: 30 %
MCH: 30 pg (ref 26.6–33.0)
MCHC: 33.6 g/dL (ref 31.5–35.7)
MCV: 89 fL (ref 79–97)
Monocytes Absolute: 0.8 10*3/uL (ref 0.1–0.9)
Monocytes: 16 %
Neutrophils Absolute: 2.6 10*3/uL (ref 1.4–7.0)
Neutrophils: 50 %
Platelets: 256 10*3/uL (ref 150–450)
RBC: 5.46 x10E6/uL (ref 4.14–5.80)
RDW: 13.7 % (ref 11.6–15.4)
WBC: 5.2 10*3/uL (ref 3.4–10.8)

## 2020-01-04 LAB — COMPREHENSIVE METABOLIC PANEL
ALT: 17 IU/L (ref 0–44)
AST: 31 IU/L (ref 0–40)
Albumin/Globulin Ratio: 1.5 (ref 1.2–2.2)
Albumin: 4.4 g/dL (ref 3.7–4.7)
Alkaline Phosphatase: 54 IU/L (ref 48–121)
BUN/Creatinine Ratio: 15 (ref 10–24)
BUN: 30 mg/dL — ABNORMAL HIGH (ref 8–27)
Bilirubin Total: 0.9 mg/dL (ref 0.0–1.2)
CO2: 22 mmol/L (ref 20–29)
Calcium: 9.9 mg/dL (ref 8.6–10.2)
Chloride: 100 mmol/L (ref 96–106)
Creatinine, Ser: 1.95 mg/dL — ABNORMAL HIGH (ref 0.76–1.27)
GFR calc Af Amer: 38 mL/min/{1.73_m2} — ABNORMAL LOW (ref >59)
GFR calc non Af Amer: 32 mL/min/{1.73_m2} — ABNORMAL LOW (ref >59)
Globulin, Total: 3 g/dL (ref 1.5–4.5)
Glucose: 86 mg/dL (ref 65–99)
Potassium: 5.7 mmol/L — ABNORMAL HIGH (ref 3.5–5.2)
Sodium: 136 mmol/L (ref 134–144)
Total Protein: 7.4 g/dL (ref 6.0–8.5)

## 2020-01-04 LAB — MAGNESIUM: Magnesium: 1.9 mg/dL (ref 1.6–2.3)

## 2020-01-04 MED ORDER — LISINOPRIL 2.5 MG PO TABS: 2.5000 mg | ORAL_TABLET | Freq: Every day | ORAL | 2 refills | Status: DC

## 2020-01-04 NOTE — Patient Instructions (Addendum)
Medication Instructions:  Your physician has recommended you make the following change in your medication:  - DECREASE Lisinopril to 2.5 mg - Take 1 (2.5 mg) tablet by mouth daily  *If you need a refill on your cardiac medications before your next appointment, please call your pharmacy*  Lab Work: Your physician has recommended that you have lab work today: CMET, CBC, and Magnesium Level  If you have labs (blood work) drawn today and your tests are completely normal, you will receive your results only by: Marland Kitchen MyChart Message (if you have MyChart) OR . A paper copy in the mail If you have any lab test that is abnormal or we need to change your treatment, we will call you to review the results.  Follow-Up: At [facility], you and your health needs are our priority.  As part of our continuing mission to provide you with exceptional heart care, we have created designated Provider Care Teams.  These Care Teams include your primary Cardiologist (physician) and Advanced Practice Providers (APPs -  Physician Assistants and Nurse Practitioners) who all work together to provide you with the care you need, when you need it.  We recommend signing up for the patient portal called "MyChart".  Sign up information is provided on this After Visit Summary.  MyChart is used to connect with patients for Virtual Visits (Telemedicine).  Patients are able to view lab/test results, encounter notes, upcoming appointments, etc.  Non-urgent messages can be sent to your provider as well.   To learn more about what you can do with MyChart, go to [email or web address].    Your next appointment:   Your physician wants you to follow-up in: 6 MONTHS with Dr. Graciela Husbands. You will receive a reminder letter in the mail two months in advance. If you don't receive a letter, please call our office to schedule the follow-up appointment.  Remote monitoring is used to monitor your Pacemaker from home. This monitoring reduces the number  of office visits required to check your device to one time per year. It allows [place] to keep an eye on the functioning of your device to ensure it is working properly. You are scheduled for a device check from home on 02/27/20. You may send your transmission at any time that day. If you have a wireless device, the transmission will be sent automatically. After your physician reviews your transmission, you will receive a postcard with your next transmission date.  The format for your next appointment:   In Person with Sherryl Manges, MD

## 2020-01-04 NOTE — Progress Notes (Signed)
Electrophysiology Office Note Date: 01/04/2020  ID:  [name], DOB 1946/05/22, MRN [id number]  PCP: [name], MD Primary Cardiologist: Dr. Haroldine Laws  Electrophysiologist: Virl Axe, MD   CC: Pacemaker follow-up  [name] is a 74 y.o. male seen today for Virl Axe, MD for routine electrophysiology followup.  Since last being seen in our clinic the patient reports doing OK. He has noted more fatigue over the past week. He states he is staying well hydrated. BP low on arrival, orthostatics negative. He denies any recent illness. No chest pain, palpitations, dyspnea, PND, orthopnea, nausea, vomiting, dizziness, syncope, edema, weight gain, or early satiety.  Device History: St. Jude BiV PPM implanted 2012, Gen change 12/2017 for profound 1st degree AV block and valvular CMP.  Past Medical History:  Diagnosis Date  . Atrial fibrillation ([place])    post op.  s/p dc-cv 04/2008. previously on amiodarone  . Atrial flutter ([place])    atypical  . AV block, 1st degree    .450 msec--progressive  . 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  a- Echo 07/2008 Ef 55% mild peroprosthetic MVR with High transmitral gradient (mean 14).  b- normal coronaries by cath 12/2007  . BPH (benign prostatic hyperplasia)   . CHF (congestive heart failure) (HCC)    EF 35-40 % 2011 due to valvular disease and diastolic dysfunction  . Chronic back pain   . CKD (chronic kidney disease), stage III   . Dysphagia    with normal barium swallow 09/2008  . Endocarditis   . GERD (gastroesophageal reflux disease)   . Heavy alcohol use    history  . History of GI bleed   . Hypertension   . IV drug abuse ([place])    history of  . Lower GI bleed 01/2016  . Pacemaker 2010   Past Surgical History:  Procedure Laterality Date  . BIV PACEMAKER GENERATOR CHANGEOUT N/A 12/13/2017   Procedure: BIV PACEMAKER GENERATOR CHANGEOUT;  Surgeon: [name], MD;  Location: [facility];  Service: Cardiovascular;  Laterality: N/A;  . CARDIAC VALVE REPLACEMENT     mitral valve, st jude mechanical   . COLONOSCOPY N/A 03/26/2014   Procedure: COLONOSCOPY;  Surgeon: Milus Banister, MD;  Location: [facility];  Service: Endoscopy;  Laterality: N/A;  . ENTEROSCOPY N/A 03/28/2014   Procedure: ENTEROSCOPY;  Surgeon: Milus Banister, MD;  Location: [place];  Service: Endoscopy;  Laterality: N/A;  . ESOPHAGOGASTRODUODENOSCOPY N/A 03/25/2014   Procedure: ESOPHAGOGASTRODUODENOSCOPY (EGD);  Surgeon: Gatha Mayer, MD;  Location[facility];  Service: Endoscopy;  Laterality: N/A;  . GIVENS CAPSULE STUDY N/A 03/26/2014   Procedure: GIVENS CAPSULE STUDY;  Surgeon: Milus Banister, MD;  Location: [place];  Service: Endoscopy;  Laterality: N/A;  . PACEMAKER INSERTION     inserted about 4-5 years ago    Current Outpatient Medications  Medication Sig Dispense Refill  . aspirin EC 81 MG tablet Take 1 tablet (81 mg total) by mouth daily. 90 tablet 3  . carvedilol (COREG) 6.25 MG tablet TAKE 1 TABLET BY MOUTH 2 TIMES DAILY WITH A MEAL. 180 tablet 1  . furosemide (LASIX) 20 MG tablet Take 1 tablet (20 mg total) by mouth daily. 90 tablet 3  . potassium chloride SA (KLOR-CON M20) 20 MEQ tablet Take 1 tablet (20 mEq total) by mouth daily. 90 tablet 3  . spironolactone (ALDACTONE) 25 MG tablet TAKE 1 TABLET (25  MG TOTAL) BY MOUTH DAILY. PLEASE MAKE FOLLOW UP APPT AT OFFICE [phone] 90 tablet 0  . tamsulosin (FLOMAX) 0.4 MG CAPS capsule TAKE 2 CAPSULES (0.8 MG TOTAL) BY MOUTH DAILY AFTER BREAKFAST. 180 capsule 1  . warfarin (COUMADIN) 5 MG tablet 1 TABLET BY MOUTH DAILY AT 6PM ALL DAYS OF WEEK EXCEPT TUES ONLY TAKE 1/2 TABLET 72 tablet 1  . lisinopril (ZESTRIL) 2.5 MG tablet Take 1 tablet (2.5 mg total) by mouth daily. 90 tablet 2   No current facility-administered medications for this visit.    Allergies:   Patient has no known allergies.   Social History: Social  History   Socioeconomic History  . Marital status: Divorced    Spouse name: Not on file  . Number of children: 32  . Years of education: Not on file  . Highest education level: Not on file  Occupational History  . Occupation: retired    Comment: [employer]  Tobacco Use  . Smoking status: Former Smoker    Years: 0.00    Types: Cigarettes  . Smokeless tobacco: Never Used  Vaping Use  . Vaping Use: Never used  Substance and Sexual Activity  . Alcohol use: No    Alcohol/week: 0.0 standard drinks  . Drug use: No  . Sexual activity: Not Currently  Other Topics Concern  . Not on file  Social History Narrative   Current Social History 02/16/2019        Patient lives with family (baby's 56, daughter and 4 grandkids:  71 yo twins, 37 yo and 38 yo in a home which is 1 story. There are 4 steps with handrails up to the entrance the patient uses.       Patient's method of transportation is via family member (baby's mama).      The highest level of education was high school diploma.      The patient currently retired.      Identified important Relationships are "My 46 Joselyn Arrow, [name]."       Pets : None       Interests / Fun: Sitting outdoors watching the birds and planes."       Exercise riding a bike 5 miles 3 days/week      Current Stressors: The grandkids       Religious / Personal Beliefs: "I believe in God."       L. Ducatte, RN, BSN       Social Determinants of Health   Financial Resource Strain:   . Difficulty of Paying Living Expenses:   Food Insecurity:   . Worried About Charity fundraiser in the Last Year:   . Arboriculturist in the Last Year:   Transportation Needs:   . Film/video editor (Medical):   Marland Kitchen Lack of Transportation (Non-Medical):   Physical Activity:   . Days of Exercise per Week:   . Minutes of Exercise per Session:   Stress:   . Feeling of Stress :   Social Connections:   . Frequency of Communication with Friends and Family:   .  Frequency of Social Gatherings with Friends and Family:   . Attends Religious Services:   . Active Member of Clubs or Organizations:   . Attends Archivist Meetings:   Marland Kitchen Marital Status:   Intimate Partner Violence:   . Fear of Current or Ex-Partner:   . Emotionally Abused:   Marland Kitchen Physically Abused:   . Sexually Abused:  Family History: Family History  Problem Relation Age of Onset  . Coronary artery disease Mother   . Cancer Father        GI  . Cancer Brother        Lung     Review of Systems: All other systems reviewed and are otherwise negative except as noted above.  Physical Exam: Vitals:   01/04/20 0958  BP: 98/68  Pulse: 70  SpO2: 97%  Weight: 178 lb (80.7 kg)     Orthostatics  Sitting 92/66 (at end of appointment)    No symptoms Standing 0 minutes 94/68   No symptoms Standing 2 minutes 96/66   No symptoms  GEN- The patient is well appearing, alert and oriented x 3 today.   HEENT: normocephalic, atraumatic; sclera clear, conjunctiva pink; hearing intact; oropharynx clear; neck supple  Lungs- Clear to ausculation bilaterally, normal work of breathing.  No wheezes, rales, rhonchi Heart- Regular rate and rhythm, no murmurs, rubs or gallops  GI- soft, non-tender, non-distended, bowel sounds present  Extremities- no clubbing, cyanosis, or edema  MS- no significant deformity or atrophy Skin- warm and dry, no rash or lesion; PPM pocket well healed Psych- euthymic mood, full affect Neuro- strength and sensation are intact  PPM Interrogation- reviewed in detail today,  See PACEART report  EKG:  EKG is ordered today. The ekg ordered today shows BiV pacing with a ? Occasionally conducting AF with controlled rates. 70 bpm  Recent Labs: 08/01/2019: B Natriuretic Peptide 79.7; BUN 22; Creatinine, Ser 1.91; Hemoglobin 15.6; Platelets 314; Potassium 5.2; Sodium 137   Wt Readings from Last 3 Encounters:  01/04/20 178 lb (80.7 kg)  03/29/19 182 lb 3.2 oz  (82.6 kg)  03/02/19 179 lb (81.2 kg)     Other studies Reviewed: Additional studies/ records that were reviewed today include: Previous EP office notes, Previous remote checks, Most recent labwork.   Assessment and Plan:  1. Profound 1st degree AV block/Valvular cardiomyopathy s/p St. Jude BIV PPM  Normal PPM function See Pace Art report No changes today He BiV paces 77% of the time, looking back chronically the "best" percentage we have gotten since gen change has been 88%. It has been ~77% since 03/2018.  Periods of non-BiV pacing appear to be intermittently conduction AF but with controlled rates in 80-90s. I'm not sure we have a way of "covering these up". Could uptitrate beta blocker, but BP would likely not tolerate currently. EF low so cant use diltiazem. Furthermore, his intrinsic QRS is narrow at around 90-95 ms so may not warrant any aggressive means at this juncture.  Will discuss further with Dr. Caryl Comes  2. Chronic systolic CHF, NICM s/p St Jude CRT-Pacemaker Echo 07/2019 with stable EF 35-40% Decrease lisinopril as below. He has required previous decreased, and did not tolerate entresto Continue coreg 6.25 mg BID for now.  Follows with CHF team. Will request follow up based on labs, with down titration of meds potentially pointing to worsening CHF/decreased cardiac reserve.   3. Permanent atrial fibrillation Rate controlled. Continue coumadin for CHA2DS2VASC of at least 4    4. S/p MV replacement On coumadin as above Goal INR 2.5 - 3.5  5. Renal insufficiency grade III Labs today.   6. Hypotension Will decrease lisinopril, especially in setting of #5.  Orthostatics negative.  Current medicines are reviewed at length with the patient today.   The patient does not have concerns regarding his medicines.  The following changes were made today:  none  Labs/ tests ordered today include:  Orders Placed This Encounter  Procedures  . Comp Met (CMET)  . Magnesium  . CBC  w/Diff  . EKG 12-Lead   Disposition:   Follow up with Dr. Caryl Comes in 6 Months   Signed, Annamaria Helling  01/04/2020 10:50 AM  [place] [address] [phone] (office) [phone] (fax)

## 2020-01-05 ENCOUNTER — Telehealth: Payer: Self-pay

## 2020-01-05 DIAGNOSIS — Z950 Presence of cardiac pacemaker: Secondary | ICD-10-CM

## 2020-01-05 DIAGNOSIS — R899 Unspecified abnormal finding in specimens from other organs, systems and tissues: Secondary | ICD-10-CM

## 2020-01-05 DIAGNOSIS — I5022 Chronic systolic (congestive) heart failure: Secondary | ICD-10-CM

## 2020-01-05 DIAGNOSIS — I4819 Other persistent atrial fibrillation: Secondary | ICD-10-CM

## 2020-01-05 NOTE — Telephone Encounter (Signed)
-----   Message from Graciella Freer, PA-C sent at 01/05/2020  8:06 AM EDT -----  K 5.7.  Please have him STOP potassium and hold spironolactone x 2 days, then resume. Needs repeat BMET next week. (We also decreased his lisinopril in the setting of hypotension. which should help as well)  Is due HF clinic f/u per last note. Will also attach HF clinic.     Thank you! Casimiro Needle [address]" [place], PA-C  01/05/2020 8:06 AM

## 2020-01-05 NOTE — Telephone Encounter (Signed)
Lmtcb for results and recommendations 

## 2020-01-10 NOTE — Telephone Encounter (Signed)
Attempted for the second time to contact patient unsuccessfully. LMTCB for results and recommendations. Will route to coumadin clinic in regards to rechecking BMET. Orders are in.   -----------------------------------------------------------------------------------------------------------------------------------------------------------------  Can we call him for BMET this week?  If still cannot reach he at least needs a BMET added to his coumadin check 7/12.   Thank you!  Casimiro Needle [address] [place], PA-C  01/09/2020 1:32 PM

## 2020-01-10 NOTE — Telephone Encounter (Signed)
INR is actually checked by internal medicine. I will send to Hulen Luster

## 2020-01-10 NOTE — Addendum Note (Signed)
Addended by: Dareen Piano on: 01/10/2020 09:06 AM   Modules accepted: Orders

## 2020-01-12 ENCOUNTER — Telehealth (HOSPITAL_COMMUNITY): Payer: Self-pay | Admitting: Vascular Surgery

## 2020-01-12 NOTE — Telephone Encounter (Signed)
Left pt message giving f/u appt w/ db 8/25 @ 3:20pm asked pt to call back to confirm appt

## 2020-01-15 ENCOUNTER — Other Ambulatory Visit: Payer: Self-pay

## 2020-01-15 ENCOUNTER — Encounter: Payer: Self-pay | Admitting: Internal Medicine

## 2020-01-15 ENCOUNTER — Ambulatory Visit: Payer: Medicare Other | Admitting: Pharmacist

## 2020-01-15 ENCOUNTER — Ambulatory Visit (INDEPENDENT_AMBULATORY_CARE_PROVIDER_SITE_OTHER): Payer: Medicare Other | Admitting: Internal Medicine

## 2020-01-15 VITALS — BP 95/70 | HR 97 | Temp 97.9°F | Ht 68.0 in | Wt 181.0 lb

## 2020-01-15 DIAGNOSIS — I10 Essential (primary) hypertension: Secondary | ICD-10-CM

## 2020-01-15 DIAGNOSIS — I130 Hypertensive heart and chronic kidney disease with heart failure and stage 1 through stage 4 chronic kidney disease, or unspecified chronic kidney disease: Secondary | ICD-10-CM | POA: Diagnosis not present

## 2020-01-15 DIAGNOSIS — N182 Chronic kidney disease, stage 2 (mild): Secondary | ICD-10-CM

## 2020-01-15 DIAGNOSIS — Z952 Presence of prosthetic heart valve: Secondary | ICD-10-CM

## 2020-01-15 DIAGNOSIS — Z7901 Long term (current) use of anticoagulants: Secondary | ICD-10-CM

## 2020-01-15 DIAGNOSIS — N189 Chronic kidney disease, unspecified: Secondary | ICD-10-CM | POA: Diagnosis not present

## 2020-01-15 DIAGNOSIS — I5022 Chronic systolic (congestive) heart failure: Secondary | ICD-10-CM

## 2020-01-15 LAB — POCT INR: INR: 3.1 — AB (ref 2.0–3.0)

## 2020-01-15 MED ORDER — SPIRONOLACTONE 25 MG PO TABS: 12.5000 mg | ORAL_TABLET | Freq: Every day | ORAL | 2 refills | Status: DC

## 2020-01-15 NOTE — Assessment & Plan Note (Signed)
Patient appears to have well compensated HFrEF with no overt signs of hypervolemia on exam today. Patient denies chest, pain shortness of breath, decreased exercise tolerance, peripheral edema, bloating, or orthopnea.   Plan: - Decrease spironolactone to 12.5 mg daily. - Otherwise, continue current medication management.

## 2020-01-15 NOTE — Patient Instructions (Signed)
Patient instructed to take medications as defined in the Anti-coagulation Track section of this encounter.  Patient instructed to take today's dose.  Patient instructed to take 1/2 tablet on Mondays and Thursdays. All other days, take one (1) tablet. Patient verbalized understanding of these instructions.

## 2020-01-15 NOTE — Progress Notes (Signed)
CC: Hypertension  HPI:  [name] is a 74 y.o. male with a past medical history stated below and presents today for hypertension. Please see problem based assessment and plan for additional details.  Past Medical History:  Diagnosis Date  . Atrial fibrillation (HCC)    post op.  s/p dc-cv 04/2008. previously on amiodarone  . Atrial flutter (HCC)    atypical  . AV block, 1st degree    .450 msec--progressive  . 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  a- Echo 07/2008 Ef 55% mild peroprosthetic MVR with High transmitral gradient (mean 14).  b- normal coronaries by cath 12/2007  . BPH (benign prostatic hyperplasia)   . CHF (congestive heart failure) (HCC)    EF 35-40 % 2011 due to valvular disease and diastolic dysfunction  . Chronic back pain   . CKD (chronic kidney disease), stage III   . Dysphagia    with normal barium swallow 09/2008  . Endocarditis   . GERD (gastroesophageal reflux disease)   . Heavy alcohol use    history  . History of GI bleed   . Hypertension   . IV drug abuse (HCC)    history of  . Lower GI bleed 01/2016  . Pacemaker 2010    Current Outpatient Medications on File Prior to Visit  Medication Sig Dispense Refill  . aspirin EC 81 MG tablet Take 1 tablet (81 mg total) by mouth daily. 90 tablet 3  . carvedilol (COREG) 6.25 MG tablet TAKE 1 TABLET BY MOUTH 2 TIMES DAILY WITH A MEAL. 180 tablet 1  . furosemide (LASIX) 20 MG tablet Take 1 tablet (20 mg total) by mouth daily. 90 tablet 3  . lisinopril (ZESTRIL) 2.5 MG tablet Take 1 tablet (2.5 mg total) by mouth daily. 90 tablet 2  . potassium chloride SA (KLOR-CON M20) 20 MEQ tablet Take 1 tablet (20 mEq total) by mouth daily. 90 tablet 3  . tamsulosin (FLOMAX) 0.4 MG CAPS capsule TAKE 2 CAPSULES (0.8 MG TOTAL) BY MOUTH DAILY AFTER BREAKFAST. 180 capsule 1  . warfarin (COUMADIN) 5 MG tablet 1 TABLET BY MOUTH DAILY AT 6PM ALL DAYS OF WEEK EXCEPT TUES ONLY TAKE 1/2  TABLET 72 tablet 1   No current facility-administered medications on file prior to visit.    Family History  Problem Relation Age of Onset  . Coronary artery disease Mother   . Cancer Father        GI  . Cancer Brother        Lung    Social History   Socioeconomic History  . Marital status: Divorced    Spouse name: Not on file  . Number of children: 9  . Years of education: Not on file  . Highest education level: Not on file  Occupational History  . Occupation: retired    Comment: [employer]  Tobacco Use  . Smoking status: Former Smoker    Years: 0.00    Types: Cigarettes  . Smokeless tobacco: Never Used  Vaping Use  . Vaping Use: Never used  Substance and Sexual Activity  . Alcohol use: No    Alcohol/week: 0.0 standard drinks  . Drug use: No  . Sexual activity: Not Currently  Other Topics Concern  . Not on file  Social History Narrative   Current Social History 02/16/2019        Patient lives with family (baby's mama, daughter and 4 grandkids:  Two  yo twins, 97 yo and 6 yo in a home which is 1 story. There are 4 steps with handrails up to the entrance the patient uses.       Patient's method of transportation is via family member (baby's mama).      The highest level of education was high school diploma.      The patient currently retired.      Identified important Relationships are "My Baby's [name], [name]."       Pets : None       Interests / Fun: Sitting outdoors watching the birds and planes."       Exercise riding a bike 5 miles 3 days/week      Current Stressors: The grandkids       Religious / Personal Beliefs: "I believe in God."       L. Ducatte, RN, BSN       Social Determinants of Health   Financial Resource Strain:   . Difficulty of Paying Living Expenses:   Food Insecurity:   . Worried About Programme researcher, broadcasting/film/video in the Last Year:   . Barista in the Last Year:   Transportation Needs:   . Freight forwarder (Medical):    Marland Kitchen Lack of Transportation (Non-Medical):   Physical Activity:   . Days of Exercise per Week:   . Minutes of Exercise per Session:   Stress:   . Feeling of Stress :   Social Connections:   . Frequency of Communication with Friends and Family:   . Frequency of Social Gatherings with Friends and Family:   . Attends Religious Services:   . Active Member of Clubs or Organizations:   . Attends Banker Meetings:   Marland Kitchen Marital Status:   Intimate Partner Violence:   . Fear of Current or Ex-Partner:   . Emotionally Abused:   Marland Kitchen Physically Abused:   . Sexually Abused:     Review of Systems: ROS negative except for what is noted on the assessment and plan.  Vitals:   01/15/20 1017  BP: 95/70  Pulse: 97  Temp: 97.9 F (36.6 C)  TempSrc: Oral  SpO2: 100%  Weight: 181 lb (82.1 kg)  Height: 5\' 8"  (1.727 m)     Physical Exam: Physical Exam Constitutional:      Appearance: Normal appearance.  HENT:     Head: Normocephalic and atraumatic.  Eyes:     Extraocular Movements: Extraocular movements intact.  Cardiovascular:     Rate and Rhythm: Normal rate.     Pulses: Normal pulses.     Heart sounds: Normal heart sounds.  Pulmonary:     Effort: Pulmonary effort is normal.     Breath sounds: Normal breath sounds.  Abdominal:     General: Bowel sounds are normal.     Palpations: Abdomen is soft.     Tenderness: There is no abdominal tenderness.  Musculoskeletal:        General: Normal range of motion.     Cervical back: Normal range of motion.     Right lower leg: No edema.     Left lower leg: No edema.  Skin:    General: Skin is warm and dry.  Neurological:     Mental Status: He is alert and oriented to person, place, and time. Mental status is at baseline.  Psychiatric:        Mood and Affect: Mood normal.      Assessment & Plan:  See Encounters Tab for problem based charting.  Patient discussed with Dr. Sarina Ill, D.O. [facility]  Internal Medicine, PGY-1 Pager: [phone], Phone: [phone] Date 01/15/2020 Time 12:48 PM

## 2020-01-15 NOTE — Assessment & Plan Note (Addendum)
Patient presents today for further evaluation and management of his HTN. His blood pressure is controlled on Lisinopril 2.5 mg qd, Carvedilol 6.25 mg BID, Spironolactone 25 mg qd, furosemide 20 mg qd and Tamsulosin 0.8 mg qAM. He admits to being compliant with his antihypertensive medications. He denies any side effects from his current antihypetensive regemin depsite his current blood pressure of 95/70. Current Blood pressure for today's visit is listed below:  Vitals:   01/15/20 1017  BP: 95/70  Pulse: 97  Temp: 97.9 F (36.6 C)  Height: 5\' 8"  (1.727 m)  Weight: 181 lb (82.1 kg)  SpO2: 100% Comment: room air  TempSrc: Oral  BMI (Calculated): 27.53     Patient was recently seen by his cardiologist on 01/04/2020 and found to have a low blood pressure of 98/68 and subsequently had his lisinopril dose cut in half to 2.5 mg daily. Orthostatics were performed at that time without symptoms of hypotension.   Patient presents today with similar blood pressure. He denies any dizziness, shortness of breath, weakness/fatiguess, syncope or presyncope.  [name] is a very functional 74 y.o. man who is very active and rides his bike daily to several times a week. He does have a history of memory impairment, but is able to perform his ADL/IADL with minimal assistance. Maintaining his functional status is an important part of his goals of care. Although he is not currently symptomatic from this blood pressure, the combination of his age, anticoagulation status, and soft blood pressures puts him at increased risk for falls that could jeopardize his current functional status. Therefore, I believe its is prudent to adjust his medications to decrease his risk of falling in the future. I will decrease his Spironolactone to 12.5 mg daily and bring him back for follow up in 1 week. Patient would also benefit form Vitamin D supplementation to improve bone health in addition to continuing his current exercise regimen.    Plan: 1. Decrease Spironolactone to 12.5 mg daily 2. F/u in 1 week to check blood pressure and further functional status evaluation.  3. Consider starting 1000-2000 IU Vit D.

## 2020-01-15 NOTE — Patient Instructions (Addendum)
Thank you, [name] for allowing [place] to provide your care today. Today we discussed blood pressure and kidney function.    I have ordered the following labs for you:   Lab Orders     BMP8+Anion Gap   I will call if any are abnormal. All of your labs can be accessed through "My Chart".   I have ordered the following medication/changed the following medications: none  Please follow-up in 1 week  Should you have any questions or concerns please call the internal medicine clinic at [phone].    Dellia Cloud, D.O. [facility]   My Chart Access: [email or web address]   If you have not already done so, please get your COVID 19 vaccine  To schedule an appointment for a COVID vaccine choice any of the following: Go to [email or web address]   Go to [email or web address]                  Call [phone]                                     Call [phone] and select Option 2

## 2020-01-15 NOTE — Assessment & Plan Note (Addendum)
Patient currently has worsening of his CKD with a Cr. Of 1.9 and GFR of 38. His potassium was elevated at 5.7 at recent cardiology appointment on 01/04/2020. His lisinopril was subsequently cut in half at that time. He is now taking lisinopril 2.5 mg daily. He states that he is eating and drink well and is having no issues with dysuria, oligoria, or hematuria. He denies diarrhea, vomiting, or decreased PO intake.  Plan: - Repeat BMP today and make medication adjustments as needed based on kidney function.   **ADDENDUM**  Patient's BMP shows mildly improved kidney function with a CR. 1.8 and a GFR of 41. Potassium has improved to 5.2.

## 2020-01-15 NOTE — Progress Notes (Signed)
Anticoagulation Management [name] is a 74 y.o. male who reports to the clinic for monitoring of warfarin treatment.    Indication: History of mitral valve replacement with mechanical valve; Long term current use of anticoagulant warfarin with INR goal 2.5 - 3.5. Duration: indefinite Supervising physician: Charissa Bash, MD  Anticoagulation Clinic Visit History: Patient does not report signs/symptoms of bleeding or thromboembolism  Other recent changes: No diet, medications, lifestyle changes endorsed by the patient at this visit.  Anticoagulation Episode Summary    Current INR goal:  2.5-3.5  TTR:  63.7 % (7.3 y)  Next INR check:  02/12/2020  INR from last check:  3.1 (01/15/2020)  Weekly max warfarin dose:    Target end date:  Indefinite  INR check location:  Anticoagulation Clinic  Preferred lab:    Send INR reminders to:  [facility] [address]   Indications   IRREGULAR PULSE [I49.9] Long term (current) use of anticoagulants (Resolved) [Z79.01] ATRIAL FLUTTER (Resolved) [I48.92] H/O mitral valve replacement with mechanical valve [Z95.2]       Comments:        Anticoagulation Care Providers    Provider Role Specialty Phone number   [name], MD  Cardiology [phone]      No Known Allergies  Current Outpatient Medications:  .  aspirin EC 81 MG tablet, Take 1 tablet (81 mg total) by mouth daily., Disp: 90 tablet, Rfl: 3 .  carvedilol (COREG) 6.25 MG tablet, TAKE 1 TABLET BY MOUTH 2 TIMES DAILY WITH A MEAL., Disp: 180 tablet, Rfl: 1 .  furosemide (LASIX) 20 MG tablet, Take 1 tablet (20 mg total) by mouth daily., Disp: 90 tablet, Rfl: 3 .  lisinopril (ZESTRIL) 2.5 MG tablet, Take 1 tablet (2.5 mg total) by mouth daily., Disp: 90 tablet, Rfl: 2 .  potassium chloride SA (KLOR-CON M20) 20 MEQ tablet, Take 1 tablet (20 mEq total) by mouth daily., Disp: 90 tablet, Rfl: 3 .  spironolactone (ALDACTONE) 25 MG tablet, TAKE 1 TABLET (25 MG TOTAL) BY MOUTH  DAILY. PLEASE MAKE FOLLOW UP APPT AT OFFICE [phone], Disp: 90 tablet, Rfl: 0 .  tamsulosin (FLOMAX) 0.4 MG CAPS capsule, TAKE 2 CAPSULES (0.8 MG TOTAL) BY MOUTH DAILY AFTER BREAKFAST., Disp: 180 capsule, Rfl: 1 .  warfarin (COUMADIN) 5 MG tablet, 1 TABLET BY MOUTH DAILY AT 6PM ALL DAYS OF WEEK EXCEPT TUES ONLY TAKE 1/2 TABLET, Disp: 72 tablet, Rfl: 1 Past Medical History:  Diagnosis Date  . Atrial fibrillation (HCC)    post op.  s/p dc-cv 04/2008. previously on amiodarone  . Atrial flutter (HCC)    atypical  . AV block, 1st degree    .450 msec--progressive  . 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  a- Echo 07/2008 Ef 55% mild peroprosthetic MVR with High transmitral gradient (mean 14).  b- normal coronaries by cath 12/2007  . BPH (benign prostatic hyperplasia)   . CHF (congestive heart failure) (HCC)    EF 35-40 % 2011 due to valvular disease and diastolic dysfunction  . Chronic back pain   . CKD (chronic kidney disease), stage III   . Dysphagia    with normal barium swallow 09/2008  . Endocarditis   . GERD (gastroesophageal reflux disease)   . Heavy alcohol use    history  . History of GI bleed   . Hypertension   . IV drug abuse (HCC)    history of  . Lower GI  bleed 01/2016  . Pacemaker 2010   Social History   Socioeconomic History  . Marital status: Divorced    Spouse name: Not on file  . Number of children: 9  . Years of education: Not on file  . Highest education level: Not on file  Occupational History  . Occupation: retired    Comment: [employer]  Tobacco Use  . Smoking status: Former Smoker    Years: 0.00    Types: Cigarettes  . Smokeless tobacco: Never Used  Vaping Use  . Vaping Use: Never used  Substance and Sexual Activity  . Alcohol use: No    Alcohol/week: 0.0 standard drinks  . Drug use: No  . Sexual activity: Not Currently  Other Topics Concern  . Not on file  Social History Narrative   Current Social  History 02/16/2019        Patient lives with family (baby's mama, daughter and 4 grandkids:  Two yo twins, 6 yo and 78 yo in a home which is 1 story. There are 4 steps with handrails up to the entrance the patient uses.       Patient's method of transportation is via family member (baby's mama).      The highest level of education was high school diploma.      The patient currently retired.      Identified important Relationships are "My Baby's [name], [name]."       Pets : None       Interests / Fun: Sitting outdoors watching the birds and planes."       Exercise riding a bike 5 miles 3 days/week      Current Stressors: The grandkids       Religious / Personal Beliefs: "I believe in God."       L. Ducatte, RN, BSN       Social Determinants of Health   Financial Resource Strain:   . Difficulty of Paying Living Expenses:   Food Insecurity:   . Worried About Programme researcher, broadcasting/film/video in the Last Year:   . Barista in the Last Year:   Transportation Needs:   . Freight forwarder (Medical):   Marland Kitchen Lack of Transportation (Non-Medical):   Physical Activity:   . Days of Exercise per Week:   . Minutes of Exercise per Session:   Stress:   . Feeling of Stress :   Social Connections:   . Frequency of Communication with Friends and Family:   . Frequency of Social Gatherings with Friends and Family:   . Attends Religious Services:   . Active Member of Clubs or Organizations:   . Attends Banker Meetings:   Marland Kitchen Marital Status:    Family History  Problem Relation Age of Onset  . Coronary artery disease Mother   . Cancer Father        GI  . Cancer Brother        Lung    ASSESSMENT Recent Results: The most recent result is correlated with 30 mg per week: Lab Results  Component Value Date   INR 3.1 (A) 01/15/2020   INR 3.3 (A) 12/18/2019   INR 3.5 (A) 11/27/2019    Anticoagulation Dosing: Description   Take 1/2 tablet on Mondays and Thursdays. All other  days, take one (1) tablet.      INR today: Therapeutic  PLAN Weekly dose was unchanged, will remain on 30mg  warfarin/wk (5mg  all days of week--except on Mondays and  Thursdays, take 2.5mg  (1/2 x 5mg ).   Patient Instructions  Patient instructed to take medications as defined in the Anti-coagulation Track section of this encounter.  Patient instructed to take today's dose.  Patient instructed to take 1/2 tablet on Mondays and Thursdays. All other days, take one (1) tablet. Patient verbalized understanding of these instructions.    Patient advised to contact clinic or seek medical attention if signs/symptoms of bleeding or thromboembolism occur.  Patient verbalized understanding by repeating back information and was advised to contact me if further medication-related questions arise. Patient was also provided an information handout.  Follow-up Return in 4 weeks (on 02/12/2020) for Follow up INR.  04/13/2020, PharmD, CPP  15 minutes spent face-to-face with the patient during the encounter. 50% of time spent on education, including signs/sx bleeding and clotting, as well as food and drug interactions with warfarin. 50% of time was spent on fingerprick POC INR sample collection,processing, results determination, and documentation in [facility].

## 2020-01-16 ENCOUNTER — Encounter: Payer: Self-pay | Admitting: Internal Medicine

## 2020-01-16 LAB — BMP8+ANION GAP
Anion Gap: 13 mmol/L (ref 10.0–18.0)
BUN/Creatinine Ratio: 18 (ref 10–24)
BUN: 32 mg/dL — ABNORMAL HIGH (ref 8–27)
CO2: 23 mmol/L (ref 20–29)
Calcium: 10 mg/dL (ref 8.6–10.2)
Chloride: 97 mmol/L (ref 96–106)
Creatinine, Ser: 1.82 mg/dL — ABNORMAL HIGH (ref 0.76–1.27)
GFR calc Af Amer: 41 mL/min/{1.73_m2} — ABNORMAL LOW (ref >59)
GFR calc non Af Amer: 35 mL/min/{1.73_m2} — ABNORMAL LOW (ref >59)
Glucose: 82 mg/dL (ref 65–99)
Potassium: 5.2 mmol/L (ref 3.5–5.2)
Sodium: 133 mmol/L — ABNORMAL LOW (ref 134–144)

## 2020-01-16 NOTE — Progress Notes (Signed)
INTERNAL MEDICINE TEACHING ATTENDING ADDENDUM  I agree with pharmacy recommendations outlined above. Dorothee Napierkowski, MD 

## 2020-01-16 NOTE — Progress Notes (Signed)
Internal Medicine Clinic Attending  Case discussed with Dr. Marchia Bond  At the time of the visit.  I reviewed the resident's history and exam and pertinent patient test results.  I agree with the assessment, diagnosis, and plan of care documented in the resident's note, particularly his functional person-centered approach to this older man with multimorbidity.

## 2020-01-16 NOTE — Assessment & Plan Note (Signed)
Patient presented today for a follow up with Dr. [name] to check his INR and make adjustments to his Warfarin therapy. Patient denies any recent falls, injuries or signs of overt bleeding.   Plan: - Continue Warfarin.

## 2020-01-19 ENCOUNTER — Telehealth: Payer: Self-pay | Admitting: Internal Medicine

## 2020-01-19 NOTE — Telephone Encounter (Signed)
New Message  Patient states that he is returning a call to our office for lab results. No notes on chart. Patient states that he will go back through voicemail and call back with the name of the person that called.

## 2020-01-22 ENCOUNTER — Telehealth (HOSPITAL_COMMUNITY): Payer: Self-pay

## 2020-01-22 ENCOUNTER — Telehealth: Payer: Self-pay | Admitting: Internal Medicine

## 2020-01-22 NOTE — Telephone Encounter (Signed)
I have attempted to call [name] several times since his last visit and left multiple voicemail to return my call without success. I was trying to reach [name] in order to clarify a recent change to his Spironolactone prescription in the setting of soft blood pressure. I wanted to make sure he understood my instructions to decreased his Spironolactone to 12.5 mg daily.I will continue to try to reach him.   Dellia Cloud, D.O. [facility], PGY-2 Pager: [phone], Phone: [phone] Date 01/22/2020 Time 9:20 AM

## 2020-01-22 NOTE — Telephone Encounter (Signed)
-----   Message from Graciella Freer, PA-C sent at 01/18/2020 10:02 AM EDT ----- Pt seen by PCP 7/13 and repeat BMET done, with follow up scheduled. Spiro cut in half at that visit.   No further action needed on our part currently.  Just closing the loop.  Thanks!

## 2020-01-24 ENCOUNTER — Other Ambulatory Visit: Payer: Self-pay | Admitting: Internal Medicine

## 2020-01-24 DIAGNOSIS — I5022 Chronic systolic (congestive) heart failure: Secondary | ICD-10-CM

## 2020-01-24 DIAGNOSIS — I10 Essential (primary) hypertension: Secondary | ICD-10-CM

## 2020-01-24 MED ORDER — SPIRONOLACTONE 25 MG PO TABS: 12.5000 mg | ORAL_TABLET | Freq: Every day | ORAL | 2 refills | Status: DC

## 2020-01-26 ENCOUNTER — Telehealth: Payer: Self-pay | Admitting: Internal Medicine

## 2020-01-26 NOTE — Telephone Encounter (Signed)
Mr. Hitchner returned my phone call. He confirmed that he will decrease his dose of spironolactone and follow up in early August to re-evaluate his blood pressure. Appointment already scheduled.   Dellia Cloud, D.O. [facility] Internal Medicine, PGY-2 Pager: [phone], Phone: [phone]

## 2020-02-12 ENCOUNTER — Other Ambulatory Visit: Payer: Self-pay

## 2020-02-12 ENCOUNTER — Ambulatory Visit (INDEPENDENT_AMBULATORY_CARE_PROVIDER_SITE_OTHER): Payer: Medicare Other | Admitting: Pharmacist

## 2020-02-12 DIAGNOSIS — Z952 Presence of prosthetic heart valve: Secondary | ICD-10-CM

## 2020-02-12 DIAGNOSIS — Z7901 Long term (current) use of anticoagulants: Secondary | ICD-10-CM | POA: Diagnosis not present

## 2020-02-12 DIAGNOSIS — I4892 Unspecified atrial flutter: Secondary | ICD-10-CM | POA: Diagnosis not present

## 2020-02-12 LAB — POCT INR: INR: 2.5 (ref 2.0–3.0)

## 2020-02-12 NOTE — Progress Notes (Signed)
Anticoagulation Management [name] is a 74 y.o. male who reports to the clinic for monitoring of warfarin treatment.    Indication: atrial fibrillation; History of mitral valve replacement with mechanical valve.  Duration: indefinite Supervising physician: Carlynn Purl  Anticoagulation Clinic Visit History: Patient does not report signs/symptoms of bleeding or thromboembolism  Other recent changes: No diet, medications, lifestyle changes endorsed by the patient at this visit.  Anticoagulation Episode Summary    Current INR goal:  2.5-3.5  TTR:  64.0 % (7.4 y)  Next INR check:  03/04/2020  INR from last check:  2.5 (02/12/2020)  Weekly max warfarin dose:    Target end date:  Indefinite  INR check location:  Anticoagulation Clinic  Preferred lab:    Send INR reminders to:  [facility] [address]   Indications   IRREGULAR PULSE [I49.9] Long term (current) use of anticoagulants (Resolved) [Z79.01] ATRIAL FLUTTER (Resolved) [I48.92] H/O mitral valve replacement with mechanical valve [Z95.2]       Comments:        Anticoagulation Care Providers    Provider Role Specialty Phone number   [name], MD  Cardiology [phone]      No Known Allergies  Current Outpatient Medications:  .  aspirin EC 81 MG tablet, Take 1 tablet (81 mg total) by mouth daily., Disp: 90 tablet, Rfl: 3 .  carvedilol (COREG) 6.25 MG tablet, TAKE 1 TABLET BY MOUTH 2 TIMES DAILY WITH A MEAL., Disp: 180 tablet, Rfl: 1 .  furosemide (LASIX) 20 MG tablet, Take 1 tablet (20 mg total) by mouth daily., Disp: 90 tablet, Rfl: 3 .  lisinopril (ZESTRIL) 2.5 MG tablet, Take 1 tablet (2.5 mg total) by mouth daily., Disp: 90 tablet, Rfl: 2 .  potassium chloride SA (KLOR-CON M20) 20 MEQ tablet, Take 1 tablet (20 mEq total) by mouth daily., Disp: 90 tablet, Rfl: 3 .  spironolactone (ALDACTONE) 25 MG tablet, Take 0.5 tablets (12.5 mg total) by mouth daily., Disp: 15 tablet, Rfl: 2 .  tamsulosin  (FLOMAX) 0.4 MG CAPS capsule, TAKE 2 CAPSULES (0.8 MG TOTAL) BY MOUTH DAILY AFTER BREAKFAST., Disp: 180 capsule, Rfl: 1 .  warfarin (COUMADIN) 5 MG tablet, 1 TABLET BY MOUTH DAILY AT 6PM ALL DAYS OF WEEK EXCEPT TUES ONLY TAKE 1/2 TABLET, Disp: 72 tablet, Rfl: 1 Past Medical History:  Diagnosis Date  . Atrial fibrillation (HCC)    post op.  s/p dc-cv 04/2008. previously on amiodarone  . Atrial flutter (HCC)    atypical  . AV block, 1st degree    .450 msec--progressive  . 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  a- Echo 07/2008 Ef 55% mild peroprosthetic MVR with High transmitral gradient (mean 14).  b- normal coronaries by cath 12/2007  . BPH (benign prostatic hyperplasia)   . CHF (congestive heart failure) (HCC)    EF 35-40 % 2011 due to valvular disease and diastolic dysfunction  . Chronic back pain   . CKD (chronic kidney disease), stage III   . Dysphagia    with normal barium swallow 09/2008  . Endocarditis   . GERD (gastroesophageal reflux disease)   . Heavy alcohol use    history  . History of GI bleed   . Hypertension   . IV drug abuse (HCC)    history of  . Lower GI bleed 01/2016  . Pacemaker 2010   Social History   Socioeconomic History  . Marital status: Divorced  Spouse name: Not on file  . Number of children: 9  . Years of education: Not on file  . Highest education level: Not on file  Occupational History  . Occupation: retired    Comment: [employer]  Tobacco Use  . Smoking status: Former Smoker    Years: 0.00    Types: Cigarettes  . Smokeless tobacco: Never Used  Vaping Use  . Vaping Use: Never used  Substance and Sexual Activity  . Alcohol use: No    Alcohol/week: 0.0 standard drinks  . Drug use: No  . Sexual activity: Not Currently  Other Topics Concern  . Not on file  Social History Narrative   Current Social History 02/16/2019        Patient lives with family (baby's mama, daughter and 4 grandkids:  Two yo  twins, 41 yo and 15 yo in a home which is 1 story. There are 4 steps with handrails up to the entrance the patient uses.       Patient's method of transportation is via family member (baby's mama).      The highest level of education was high school diploma.      The patient currently retired.      Identified important Relationships are "My Baby's [name], [name]."       Pets : None       Interests / Fun: Sitting outdoors watching the birds and planes."       Exercise riding a bike 5 miles 3 days/week      Current Stressors: The grandkids       Religious / Personal Beliefs: "I believe in God."       L. Ducatte, RN, BSN       Social Determinants of Health   Financial Resource Strain:   . Difficulty of Paying Living Expenses:   Food Insecurity:   . Worried About Programme researcher, broadcasting/film/video in the Last Year:   . Barista in the Last Year:   Transportation Needs:   . Freight forwarder (Medical):   Marland Kitchen Lack of Transportation (Non-Medical):   Physical Activity:   . Days of Exercise per Week:   . Minutes of Exercise per Session:   Stress:   . Feeling of Stress :   Social Connections:   . Frequency of Communication with Friends and Family:   . Frequency of Social Gatherings with Friends and Family:   . Attends Religious Services:   . Active Member of Clubs or Organizations:   . Attends Banker Meetings:   Marland Kitchen Marital Status:    Family History  Problem Relation Age of Onset  . Coronary artery disease Mother   . Cancer Father        GI  . Cancer Brother        Lung    ASSESSMENT Recent Results: The most recent result is correlated with 30 mg per week: Lab Results  Component Value Date   INR 2.5 02/12/2020   INR 3.1 (A) 01/15/2020   INR 3.3 (A) 12/18/2019    Anticoagulation Dosing: Description   Take one (1) of your 5mg  peach-colored warfarin tablets by mouth, once-daily at 6PM.      INR today: Therapeutic  PLAN Weekly dose was increased by  16% to 35 mg per week  Patient Instructions  Patient instructed to take medications as defined in the Anti-coagulation Track section of this encounter.  Patient instructed to take today's dose.  Patient instructed  to take one (1) of your 5mg  peach-colored warfarin tablets by mouth, once-daily at [facility] Patient verbalized understanding of these instructions.    Patient advised to contact clinic or seek medical attention if signs/symptoms of bleeding or thromboembolism occur.  Patient verbalized understanding by repeating back information and was advised to contact me if further medication-related questions arise. Patient was also provided an information handout.  Follow-up Return in 3 weeks (on 03/04/2020) for Follow up INR.  03/06/2020, PharmD, CPP  15 minutes spent face-to-face with the patient during the encounter. 50% of time spent on education, including signs/sx bleeding and clotting, as well as food and drug interactions with warfarin. 50% of time was spent on fingerprick POC INR sample collection,processing, results determination, and documentation in [place].

## 2020-02-12 NOTE — Patient Instructions (Signed)
Patient instructed to take medications as defined in the Anti-coagulation Track section of this encounter.  Patient instructed to take today's dose.  Patient instructed to take one (1) of your 5mg peach-colored warfarin tablets by mouth, once-daily at 6PM. Patient verbalized understanding of these instructions.    

## 2020-02-18 ENCOUNTER — Other Ambulatory Visit (HOSPITAL_COMMUNITY): Payer: Self-pay | Admitting: Internal Medicine

## 2020-02-18 DIAGNOSIS — I10 Essential (primary) hypertension: Secondary | ICD-10-CM

## 2020-02-27 ENCOUNTER — Ambulatory Visit (INDEPENDENT_AMBULATORY_CARE_PROVIDER_SITE_OTHER): Payer: Medicare Other | Admitting: *Deleted

## 2020-02-27 DIAGNOSIS — I4819 Other persistent atrial fibrillation: Secondary | ICD-10-CM | POA: Diagnosis not present

## 2020-02-28 ENCOUNTER — Encounter (HOSPITAL_COMMUNITY): Payer: Medicare Other | Admitting: Internal Medicine

## 2020-02-28 LAB — CUP PACEART REMOTE DEVICE CHECK
Battery Remaining Longevity: 99 mo
Battery Remaining Percentage: 95.5 %
Battery Voltage: 2.99 V
Date Time Interrogation Session: 20210825040014
Implantable Lead Implant Date: 20120905
Implantable Lead Implant Date: 20120905
Implantable Lead Implant Date: 20120905
Implantable Lead Location: 753858
Implantable Lead Location: 753859
Implantable Lead Location: 753860
Implantable Pulse Generator Implant Date: 20190610
Lead Channel Impedance Value: 430 Ohm
Lead Channel Impedance Value: 750 Ohm
Lead Channel Pacing Threshold Amplitude: 0.875 V
Lead Channel Pacing Threshold Amplitude: 1.5 V
Lead Channel Pacing Threshold Pulse Width: 0.5 ms
Lead Channel Pacing Threshold Pulse Width: 0.6 ms
Lead Channel Sensing Intrinsic Amplitude: 12 mV
Lead Channel Setting Pacing Amplitude: 2 V
Lead Channel Setting Pacing Amplitude: 2.5 V
Lead Channel Setting Pacing Pulse Width: 0.5 ms
Lead Channel Setting Pacing Pulse Width: 0.6 ms
Lead Channel Setting Sensing Sensitivity: 2 mV
Pulse Gen Model: 3222
Pulse Gen Serial Number: 9022287

## 2020-03-04 ENCOUNTER — Other Ambulatory Visit: Payer: Self-pay

## 2020-03-04 ENCOUNTER — Ambulatory Visit (INDEPENDENT_AMBULATORY_CARE_PROVIDER_SITE_OTHER): Payer: Medicare Other | Admitting: Pharmacist

## 2020-03-04 DIAGNOSIS — Z7901 Long term (current) use of anticoagulants: Secondary | ICD-10-CM | POA: Diagnosis not present

## 2020-03-04 DIAGNOSIS — Z952 Presence of prosthetic heart valve: Secondary | ICD-10-CM

## 2020-03-04 LAB — POCT INR: INR: 4.8 — AB (ref 2.0–3.0)

## 2020-03-04 NOTE — Patient Instructions (Signed)
Patient instructed to take medications as defined in the Anti-coagulation Track section of this encounter.  Patient instructed to  today's dose.  Patient instructed to take one (1) of your 5mg  peach-colored warfarin tablets by mouth, once-daily at [facility] on all days except Tuesdays. Take 1/2 tablet on Tuesdays only.  Patient verbalized understanding of these instructions.

## 2020-03-04 NOTE — Progress Notes (Signed)
Anticoagulation Management [name] is a 74 y.o. male who reports to the clinic for monitoring of warfarin treatment.    Indication: mitral valve replacement: long term current use of anticoagulant (INR goal 2.5-3.5) Duration: indefinite Supervising physician: Carlynn Purl  Anticoagulation Clinic Visit History: Patient does not report signs/symptoms of bleeding or thromboembolism  Other recent changes: No changes to diet, medications, lifestyle Anticoagulation Episode Summary    Current INR goal:  2.5-3.5  TTR:  63.9 % (7.5 y)  Next INR check:  03/25/2020  INR from last check:  4.8 (03/04/2020)  Weekly max warfarin dose:    Target end date:  Indefinite  INR check location:  Anticoagulation Clinic  Preferred lab:    Send INR reminders to:  [facility] [address]   Indications   IRREGULAR PULSE [I49.9] Long term (current) use of anticoagulants (Resolved) [Z79.01] ATRIAL FLUTTER (Resolved) [I48.92] H/O mitral valve replacement with mechanical valve [Z95.2]       Comments:        Anticoagulation Care Providers    Provider Role Specialty Phone number   [name], MD  Cardiology [phone]      No Known Allergies  Current Outpatient Medications:  .  aspirin EC 81 MG tablet, Take 1 tablet (81 mg total) by mouth daily., Disp: 90 tablet, Rfl: 3 .  carvedilol (COREG) 6.25 MG tablet, TAKE 1 TABLET BY MOUTH 2 TIMES DAILY WITH A MEAL., Disp: 180 tablet, Rfl: 1 .  furosemide (LASIX) 20 MG tablet, Take 1 tablet (20 mg total) by mouth daily., Disp: 90 tablet, Rfl: 3 .  lisinopril (ZESTRIL) 2.5 MG tablet, Take 1 tablet (2.5 mg total) by mouth daily., Disp: 90 tablet, Rfl: 2 .  potassium chloride SA (KLOR-CON M20) 20 MEQ tablet, Take 1 tablet (20 mEq total) by mouth daily., Disp: 90 tablet, Rfl: 3 .  spironolactone (ALDACTONE) 25 MG tablet, Take 1 tablet (25 mg total) by mouth daily., Disp: 90 tablet, Rfl: 3 .  tamsulosin (FLOMAX) 0.4 MG CAPS capsule, TAKE 2  CAPSULES (0.8 MG TOTAL) BY MOUTH DAILY AFTER BREAKFAST., Disp: 180 capsule, Rfl: 1 .  warfarin (COUMADIN) 5 MG tablet, 1 TABLET BY MOUTH DAILY AT 6PM ALL DAYS OF WEEK EXCEPT TUES ONLY TAKE 1/2 TABLET, Disp: 72 tablet, Rfl: 1 Past Medical History:  Diagnosis Date  . Atrial fibrillation (HCC)    post op.  s/p dc-cv 04/2008. previously on amiodarone  . Atrial flutter (HCC)    atypical  . AV block, 1st degree    .450 msec--progressive  . 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  a- Echo 07/2008 Ef 55% mild peroprosthetic MVR with High transmitral gradient (mean 14).  b- normal coronaries by cath 12/2007  . BPH (benign prostatic hyperplasia)   . CHF (congestive heart failure) (HCC)    EF 35-40 % 2011 due to valvular disease and diastolic dysfunction  . Chronic back pain   . CKD (chronic kidney disease), stage III   . Dysphagia    with normal barium swallow 09/2008  . Endocarditis   . GERD (gastroesophageal reflux disease)   . Heavy alcohol use    history  . History of GI bleed   . Hypertension   . IV drug abuse (HCC)    history of  . Lower GI bleed 01/2016  . Pacemaker 2010   Social History   Socioeconomic History  . Marital status: Divorced    Spouse name: Not  on file  . Number of children: 9  . Years of education: Not on file  . Highest education level: Not on file  Occupational History  . Occupation: retired    Comment: [employer]  Tobacco Use  . Smoking status: Former Smoker    Years: 0.00    Types: Cigarettes  . Smokeless tobacco: Never Used  Vaping Use  . Vaping Use: Never used  Substance and Sexual Activity  . Alcohol use: No    Alcohol/week: 0.0 standard drinks  . Drug use: No  . Sexual activity: Not Currently  Other Topics Concern  . Not on file  Social History Narrative   Current Social History 02/16/2019        Patient lives with family (baby's mama, daughter and 4 grandkids:  Two yo twins, 66 yo and 57 yo in a home which  is 1 story. There are 4 steps with handrails up to the entrance the patient uses.       Patient's method of transportation is via family member (baby's mama).      The highest level of education was high school diploma.      The patient currently retired.      Identified important Relationships are "My Baby's [name], [name]."       Pets : None       Interests / Fun: Sitting outdoors watching the birds and planes."       Exercise riding a bike 5 miles 3 days/week      Current Stressors: The grandkids       Religious / Personal Beliefs: "I believe in God."       L. Ducatte, RN, BSN       Social Determinants of Health   Financial Resource Strain:   . Difficulty of Paying Living Expenses: Not on file  Food Insecurity:   . Worried About Programme researcher, broadcasting/film/video in the Last Year: Not on file  . Ran Out of Food in the Last Year: Not on file  Transportation Needs:   . Lack of Transportation (Medical): Not on file  . Lack of Transportation (Non-Medical): Not on file  Physical Activity:   . Days of Exercise per Week: Not on file  . Minutes of Exercise per Session: Not on file  Stress:   . Feeling of Stress : Not on file  Social Connections:   . Frequency of Communication with Friends and Family: Not on file  . Frequency of Social Gatherings with Friends and Family: Not on file  . Attends Religious Services: Not on file  . Active Member of Clubs or Organizations: Not on file  . Attends Banker Meetings: Not on file  . Marital Status: Not on file   Family History  Problem Relation Age of Onset  . Coronary artery disease Mother   . Cancer Father        GI  . Cancer Brother        Lung    ASSESSMENT Recent Results: The most recent result is correlated with 35 mg per week: Lab Results  Component Value Date   INR 4.8 (A) 03/04/2020   INR 2.5 02/12/2020   INR 3.1 (A) 01/15/2020    Anticoagulation Dosing: Description   Take one (1) of your 5mg  peach-colored  warfarin tablets by mouth, once-daily at [facility] on all days except Tuesdays. Take 1/2 tablet on Tuesdays only.      INR today: Supratherapeutic  PLAN Weekly dose was decreased  by 7% to 32.5 mg per week. Omitted dose today (8/30).   Patient Instructions  Patient instructed to take medications as defined in the Anti-coagulation Track section of this encounter.  Patient instructed to  today's dose.  Patient instructed to take one (1) of your 5mg  peach-colored warfarin tablets by mouth, once-daily at [facility] on all days except Tuesdays. Take 1/2 tablet on Tuesdays only.  Patient verbalized understanding of these instructions.    Patient advised to contact clinic or seek medical attention if signs/symptoms of bleeding or thromboembolism occur.  Patient verbalized understanding by repeating back information and was advised to contact me if further medication-related questions arise. Patient was also provided an information handout.  Follow-up Return in 3 weeks (on 03/25/2020).  03/27/2020, PharmD PGY1 Pharmacy Resident  15 minutes spent face-to-face with the patient during the encounter. 50% of time spent on education, including signs/sx bleeding and clotting, as well as food and drug interactions with warfarin. 50% of time was spent on fingerprick POC INR sample collection,processing, results determination, and documentation in [place].

## 2020-03-04 NOTE — Progress Notes (Signed)
Remote pacemaker transmission.   

## 2020-03-25 ENCOUNTER — Ambulatory Visit (INDEPENDENT_AMBULATORY_CARE_PROVIDER_SITE_OTHER): Payer: Medicare Other | Admitting: Pharmacist

## 2020-03-25 ENCOUNTER — Other Ambulatory Visit: Payer: Self-pay | Admitting: Internal Medicine

## 2020-03-25 DIAGNOSIS — Z7901 Long term (current) use of anticoagulants: Secondary | ICD-10-CM | POA: Diagnosis not present

## 2020-03-25 DIAGNOSIS — E876 Hypokalemia: Secondary | ICD-10-CM

## 2020-03-25 DIAGNOSIS — Z952 Presence of prosthetic heart valve: Secondary | ICD-10-CM

## 2020-03-25 DIAGNOSIS — I4892 Unspecified atrial flutter: Secondary | ICD-10-CM

## 2020-03-25 LAB — POCT INR: INR: 5 — AB (ref 2.0–3.0)

## 2020-03-25 NOTE — Patient Instructions (Signed)
Patient instructed to take medications as defined in the Anti-coagulation Track section of this encounter.  Patient instructed to omit today's dose only.  Patient instructed to take one (1) of your 5mg  peach-colored warfarin tablets by mouth, once-daily at [facility] on Sundays, Mondays, Wednesdays, and Fridays. Take 1/2 tablet on Tuesdays, Thursdays, and Saturdays only.  Patient verbalized understanding of these instructions.  10-01-1992

## 2020-03-25 NOTE — Progress Notes (Signed)
Anticoagulation Management [name] is a 74 y.o. male who reports to the clinic for monitoring of warfarin treatment.    Indication: history of mitral valve replacement Duration: indefinite Supervising physician: Carlynn Purl  Anticoagulation Clinic Visit History: Patient does not report signs/symptoms of bleeding or thromboembolism  Other recent changes: patient does not endorse changes to diet, medications, lifestyle Anticoagulation Episode Summary    Current INR goal:  2.5-3.5  TTR:  63.4 % (7.5 y)  Next INR check:  04/15/2020  INR from last check:    Weekly max warfarin dose:    Target end date:  Indefinite  INR check location:  Anticoagulation Clinic  Preferred lab:    Send INR reminders to:  [facility] [address]   Indications   IRREGULAR PULSE [I49.9] Long term (current) use of anticoagulants (Resolved) [Z79.01] ATRIAL FLUTTER (Resolved) [I48.92] H/O mitral valve replacement with mechanical valve [Z95.2]       Comments:        Anticoagulation Care Providers    Provider Role Specialty Phone number   [name], MD  Cardiology [phone]      No Known Allergies  Current Outpatient Medications:  .  aspirin EC 81 MG tablet, Take 1 tablet (81 mg total) by mouth daily., Disp: 90 tablet, Rfl: 3 .  carvedilol (COREG) 6.25 MG tablet, TAKE 1 TABLET BY MOUTH 2 TIMES DAILY WITH A MEAL., Disp: 180 tablet, Rfl: 1 .  furosemide (LASIX) 20 MG tablet, Take 1 tablet (20 mg total) by mouth daily., Disp: 90 tablet, Rfl: 3 .  lisinopril (ZESTRIL) 2.5 MG tablet, Take 1 tablet (2.5 mg total) by mouth daily., Disp: 90 tablet, Rfl: 2 .  potassium chloride SA (KLOR-CON M20) 20 MEQ tablet, Take 1 tablet (20 mEq total) by mouth daily., Disp: 90 tablet, Rfl: 3 .  spironolactone (ALDACTONE) 25 MG tablet, Take 1 tablet (25 mg total) by mouth daily., Disp: 90 tablet, Rfl: 3 .  tamsulosin (FLOMAX) 0.4 MG CAPS capsule, TAKE 2 CAPSULES (0.8 MG TOTAL) BY MOUTH DAILY AFTER  BREAKFAST., Disp: 180 capsule, Rfl: 1 .  warfarin (COUMADIN) 5 MG tablet, 1 TABLET BY MOUTH DAILY AT 6PM ALL DAYS OF WEEK EXCEPT TUES ONLY TAKE 1/2 TABLET, Disp: 72 tablet, Rfl: 1 Past Medical History:  Diagnosis Date  . Atrial fibrillation (HCC)    post op.  s/p dc-cv 04/2008. previously on amiodarone  . Atrial flutter (HCC)    atypical  . AV block, 1st degree    .450 msec--progressive  . 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  a- Echo 07/2008 Ef 55% mild peroprosthetic MVR with High transmitral gradient (mean 14).  b- normal coronaries by cath 12/2007  . BPH (benign prostatic hyperplasia)   . CHF (congestive heart failure) (HCC)    EF 35-40 % 2011 due to valvular disease and diastolic dysfunction  . Chronic back pain   . CKD (chronic kidney disease), stage III   . Dysphagia    with normal barium swallow 09/2008  . Endocarditis   . GERD (gastroesophageal reflux disease)   . Heavy alcohol use    history  . History of GI bleed   . Hypertension   . IV drug abuse (HCC)    history of  . Lower GI bleed 01/2016  . Pacemaker 2010   Social History   Socioeconomic History  . Marital status: Divorced    Spouse name: Not on file  . Number  of children: 9  . Years of education: Not on file  . Highest education level: Not on file  Occupational History  . Occupation: retired    Comment: [employer]  Tobacco Use  . Smoking status: Former Smoker    Years: 0.00    Types: Cigarettes  . Smokeless tobacco: Never Used  Vaping Use  . Vaping Use: Never used  Substance and Sexual Activity  . Alcohol use: No    Alcohol/week: 0.0 standard drinks  . Drug use: No  . Sexual activity: Not Currently  Other Topics Concern  . Not on file  Social History Narrative   Current Social History 02/16/2019        Patient lives with family (baby's mama, daughter and 4 grandkids:  Two yo twins, 7 yo and 67 yo in a home which is 1 story. There are 4 steps with handrails  up to the entrance the patient uses.       Patient's method of transportation is via family member (baby's mama).      The highest level of education was high school diploma.      The patient currently retired.      Identified important Relationships are "My Baby's [name], [name]."       Pets : None       Interests / Fun: Sitting outdoors watching the birds and planes."       Exercise riding a bike 5 miles 3 days/week      Current Stressors: The grandkids       Religious / Personal Beliefs: "I believe in God."       L. Ducatte, RN, BSN       Social Determinants of Health   Financial Resource Strain:   . Difficulty of Paying Living Expenses: Not on file  Food Insecurity:   . Worried About Programme researcher, broadcasting/film/video in the Last Year: Not on file  . Ran Out of Food in the Last Year: Not on file  Transportation Needs:   . Lack of Transportation (Medical): Not on file  . Lack of Transportation (Non-Medical): Not on file  Physical Activity:   . Days of Exercise per Week: Not on file  . Minutes of Exercise per Session: Not on file  Stress:   . Feeling of Stress : Not on file  Social Connections:   . Frequency of Communication with Friends and Family: Not on file  . Frequency of Social Gatherings with Friends and Family: Not on file  . Attends Religious Services: Not on file  . Active Member of Clubs or Organizations: Not on file  . Attends Banker Meetings: Not on file  . Marital Status: Not on file   Family History  Problem Relation Age of Onset  . Coronary artery disease Mother   . Cancer Father        GI  . Cancer Brother        Lung    ASSESSMENT Recent Results: The most recent result is correlated with 32.5 mg per week: Lab Results  Component Value Date   INR 5.0 (A) 03/25/2020   INR 4.8 (A) 03/04/2020   INR 2.5 02/12/2020    Anticoagulation Dosing: Description   Take one (1) of your 5mg  peach-colored warfarin tablets by mouth, once-daily at  [facility] on Sundays, Mondays, Wednesdays, and Fridays. Take 1/2 tablet on Tuesdays, Thursdays, and Saturdays only.      INR today: Supratherapeutic  PLAN Weekly dose was decreased by  15% to 27.5 mg per week  Patient Instructions  Patient instructed to take medications as defined in the Anti-coagulation Track section of this encounter.  Patient instructed to omit today's dose only.  Patient instructed to take one (1) of your 5mg  peach-colored warfarin tablets by mouth, once-daily at [facility] on Sundays, Mondays, Wednesdays, and Fridays. Take 1/2 tablet on Tuesdays, Thursdays, and Saturdays only.  Patient verbalized understanding of these instructions.  .    Patient advised to contact clinic or seek medical attention if signs/symptoms of bleeding or thromboembolism occur.  Patient verbalized understanding by repeating back information and was advised to contact me if further medication-related questions arise. Patient was also provided an information handout.  Follow-up Return in 3 weeks (on 04/15/2020) for followup INR.  06/15/2020, PharmD PGY1 Pharmacy Resident  15 minutes spent face-to-face with the patient during the encounter. 50% of time spent on education, including signs/sx bleeding and clotting, as well as food and drug interactions with warfarin. 50% of time was spent on fingerprick POC INR sample collection,processing, results determination, and documentation in [place].

## 2020-04-08 ENCOUNTER — Telehealth: Payer: Self-pay | Admitting: Pharmacist

## 2020-04-08 NOTE — Telephone Encounter (Signed)
Called patient and left message:  Must reschedule your apppointment originally for 11-OCT-21 to 25-OCT-21 at 1015h.

## 2020-04-09 NOTE — Progress Notes (Signed)
Advanced Heart Failure Clinic Note  Date:  04/10/2020   ID:  [name], DOB 19-Jul-1945, MRN [id number]  Location: Home  Provider location:  Advanced [facility] Failure Clinic Type of Visit: Established patient  PCP:  Richard Cloud, MD  Cardiologist:  No primary care provider on file. Primary HF: [name]  Chief Complaint: Heart Failure follow-up   History of Present Illness:  [name] is a 74 year old male with h/o polysubstance abuse c/b MV endocarditis s/p St. Jude MVR, CHF due to NICM, CKD IIIb and PAF.  Underwent CRT-P implant for bradycardia and fatigue. (Had gen change in 6/19.)  He presents today for routine up. Says he feels ok. Walks down the street every day to the park and back. About each way. No SOB or CP. Denies edema, orthopnea or PND. No ICD firings. Complaint with meds. lsix stopped about a month ago by IMTS due to volume depletion   Echo 1/21: EF 35-40% RV ok. mechanical MVR ok,. Personally reviewed  Echo 12/10/17 EF 35-40% with mechanical MVR   Studies:  TTE in 2010 showed normal EF with elevated gradient across MV (mean 14) with perivalvular leak. Follow-up TEE in June 2010 with EF 40-45% mild stenosis mean gradient = 8. No vegetation or abscess.   Echo 8/12 EF 35% mild perivalvular MR.  Echo 3/13 EF 50% mild MVR stable with mild peri-valvular leak.  ECHO 12/16/2012 EF 50% stable MVR ECHO 12/2014 - EF 25-30%  Grade I DD, mechanical mitral valve with mean gradient 6 mmHg, no significant MR.  ECHO 09/2015- EF 35-40%. Grade I DD Mitral Valve ok no stenosis    Past Medical History:  Diagnosis Date  . Atrial fibrillation (HCC)    post op.  s/p dc-cv 04/2008. previously on amiodarone  . Atrial flutter (HCC)    atypical  . AV block, 1st degree    .450 msec--progressive  . 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  a- Echo 07/2008 Ef 55% mild peroprosthetic MVR with High transmitral gradient (mean  14).  b- normal coronaries by cath 12/2007  . BPH (benign prostatic hyperplasia)   . CHF (congestive heart failure) (HCC)    EF 35-40 % 2011 due to valvular disease and diastolic dysfunction  . Chronic back pain   . CKD (chronic kidney disease), stage III   . Dysphagia    with normal barium swallow 09/2008  . Endocarditis   . GERD (gastroesophageal reflux disease)   . Heavy alcohol use    history  . History of GI bleed   . Hypertension   . IV drug abuse (HCC)    history of  . Lower GI bleed 01/2016  . Pacemaker 2010   Past Surgical History:  Procedure Laterality Date  . BIV PACEMAKER GENERATOR CHANGEOUT N/A 12/13/2017   Procedure: BIV PACEMAKER GENERATOR CHANGEOUT;  Surgeon: Duke Salvia, MD;  Location: [facility] INVASIVE CV LAB;  Service: Cardiovascular;  Laterality: N/A;  . CARDIAC VALVE REPLACEMENT     mitral valve, st jude mechanical   . COLONOSCOPY N/A 03/26/2014   Procedure: COLONOSCOPY;  Surgeon: Rachael Fee, MD;  Location: [facility] ENDOSCOPY;  Service: Endoscopy;  Laterality: N/A;  . ENTEROSCOPY N/A 03/28/2014   Procedure: ENTEROSCOPY;  Surgeon: Rachael Fee, MD;  Location: [facility] ENDOSCOPY;  Service: Endoscopy;  Laterality: N/A;  . ESOPHAGOGASTRODUODENOSCOPY N/A 03/25/2014   Procedure: ESOPHAGOGASTRODUODENOSCOPY (EGD);  Surgeon: Iva Boop, MD;  Location: [facility] ENDOSCOPY;  Service: Endoscopy;  Laterality: N/A;  . GIVENS CAPSULE STUDY N/A 03/26/2014   Procedure: GIVENS CAPSULE STUDY;  Surgeon: Rachael Fee, MD;  Location: [facility] ENDOSCOPY;  Service: Endoscopy;  Laterality: N/A;  . PACEMAKER INSERTION     inserted about 4-5 years ago     Current Outpatient Medications  Medication Sig Dispense Refill  . aspirin EC 81 MG tablet Take 1 tablet (81 mg total) by mouth daily. 90 tablet 3  . carvedilol (COREG) 6.25 MG tablet TAKE 1 TABLET BY MOUTH 2 TIMES DAILY WITH A MEAL. 180 tablet 1  . KLOR-CON M20 20 MEQ tablet TAKE 1 TABLET BY MOUTH EVERY DAY 90 tablet 3  . lisinopril (ZESTRIL) 2.5  MG tablet Take 1 tablet (2.5 mg total) by mouth daily. 90 tablet 2  . spironolactone (ALDACTONE) 25 MG tablet Take 1 tablet (25 mg total) by mouth daily. 90 tablet 3  . tamsulosin (FLOMAX) 0.4 MG CAPS capsule TAKE 2 CAPSULES (0.8 MG TOTAL) BY MOUTH DAILY AFTER BREAKFAST. 180 capsule 1  . warfarin (COUMADIN) 5 MG tablet 1 TABLET BY MOUTH DAILY AT 6PM ALL DAYS OF WEEK EXCEPT TUES ONLY TAKE 1/2 TABLET 72 tablet 1  . furosemide (LASIX) 20 MG tablet Take 1 tablet (20 mg total) by mouth daily. (Patient not taking: Reported on 04/10/2020) 90 tablet 3   No current facility-administered medications for this encounter.    Allergies:   Patient has no known allergies.   Social History:  The patient  reports that he has quit smoking. His smoking use included cigarettes. He quit after 0.00 years of use. He has never used smokeless tobacco. He reports that he does not drink alcohol and does not use drugs.   Family History:  The patient's family history includes Cancer in his brother and father; Coronary artery disease in his mother.   ROS:  Please see the history of present illness.   All other systems are personally reviewed and negative.   Vitals:   04/10/20 1145  BP: 116/74  Pulse: 72  SpO2: 98%  Weight: 82.7 kg (182 lb 6.4 oz)  Height: 5\' 8"  (1.727 m)    Exam:  General:  Elderly male. Walks with cane. No resp difficulty HEENT: normal Neck: supple. JVP 6. Carotids 2+ bilat; no bruits. No lymphadenopathy or thryomegaly appreciated. Cor: PMI nondisplaced. Regular rate & rhythm. No rubs, gallops or murmurs. Lungs: clear Abdomen: soft, nontender, nondistended. No hepatosplenomegaly. No bruits or masses. Good bowel sounds. Extremities: no cyanosis, clubbing, rash, edema Neuro: alert & orientedx3, cranial nerves grossly intact. moves all 4 extremities w/o difficulty. Affect pleasant  ECG AF with BIV pacing 84 Personally reviewed   Recent Labs: 08/01/2019: B Natriuretic Peptide 79.7 01/04/2020: ALT  17; Hemoglobin 16.4; Magnesium 1.9; Platelets 256 01/15/2020: BUN 32; Creatinine, Ser 1.82; Potassium 5.2; Sodium 133  Personally reviewed   Wt Readings from Last 3 Encounters:  04/10/20 82.7 kg (182 lb 6.4 oz)  01/15/20 82.1 kg (181 lb)  01/04/20 80.7 kg (178 lb)      ASSESSMENT AND PLAN:  1. Chronic systolic HF: Nonischemic cardiomyopathy s/p St Jude  CRT-D.  March 2017 EF improved 35-40%.  - Echo 1/21: EF 35-40% RV ok. MV repair ok,. Personally reviewed - Doing very well. NYHA I - Volume status appears good. Lasix stopped recently by IMTS due to voule depletion per his report. Will stop Kcl as well  - Continue Coreg 6.25 mg bid.  - Continue Lisinopril 5 daily (had to cut back  from 5 bid in past due to low BP). Has also limited switch to Entresto - Continue 25 mg spiro daily - No SGLT2i currently given risk of volume depletion  - labs today - ICD interrogation looks good. No fluid. Biv pacing 77% of time. Has been seen by EP and this ahs been optimized as best as possible.   2. Mechanical mitral valve:  - .On coumadin + aspirin 81 mg daily.   - INR has been high lately. Followed by IMTS  - MVR stable on echo 1/21. - Reminded about need for SBE prophylaxis but he has no teeth left 3. GI Bleed: 03/2014,  - no BRBPR/melena. Continue PPI.  - check CBC today 4. HTN - Blood pressure well controlled. Continue current regimen. 5. Chronic AF -  well tolerated. Rate controlled - on warfarin  6. Memory Impairment - Follows with PCP.  - says he continues to struggle with this. Lives with her daughter and grnadkids.    Signed, [name], MD  04/10/2020 12:15 PM  Advanced Heart Failure Clinic [facility] [address] Heart and Vascular [place] [address] [phone] (office) [phone] (fax)

## 2020-04-10 ENCOUNTER — Other Ambulatory Visit: Payer: Self-pay

## 2020-04-10 ENCOUNTER — Ambulatory Visit (HOSPITAL_COMMUNITY)
Admission: RE | Admit: 2020-04-10 | Discharge: 2020-04-10 | Disposition: A | Discharge status: Home / Self Care | Payer: Medicare Other | Source: Ambulatory Visit | Attending: Internal Medicine | Admitting: Internal Medicine

## 2020-04-10 VITALS — BP 116/74 | HR 72 | Ht 68.0 in | Wt 182.4 lb

## 2020-04-10 DIAGNOSIS — Z95810 Presence of automatic (implantable) cardiac defibrillator: Secondary | ICD-10-CM

## 2020-04-10 DIAGNOSIS — I130 Hypertensive heart and chronic kidney disease with heart failure and stage 1 through stage 4 chronic kidney disease, or unspecified chronic kidney disease: Secondary | ICD-10-CM | POA: Insufficient documentation

## 2020-04-10 DIAGNOSIS — Z952 Presence of prosthetic heart valve: Secondary | ICD-10-CM | POA: Insufficient documentation

## 2020-04-10 DIAGNOSIS — I4819 Other persistent atrial fibrillation: Secondary | ICD-10-CM | POA: Diagnosis not present

## 2020-04-10 DIAGNOSIS — I482 Chronic atrial fibrillation, unspecified: Secondary | ICD-10-CM | POA: Insufficient documentation

## 2020-04-10 DIAGNOSIS — N400 Enlarged prostate without lower urinary tract symptoms: Secondary | ICD-10-CM | POA: Diagnosis not present

## 2020-04-10 DIAGNOSIS — K219 Gastro-esophageal reflux disease without esophagitis: Secondary | ICD-10-CM | POA: Insufficient documentation

## 2020-04-10 DIAGNOSIS — Z7901 Long term (current) use of anticoagulants: Secondary | ICD-10-CM | POA: Diagnosis not present

## 2020-04-10 DIAGNOSIS — Z8249 Family history of ischemic heart disease and other diseases of the circulatory system: Secondary | ICD-10-CM | POA: Insufficient documentation

## 2020-04-10 DIAGNOSIS — I480 Paroxysmal atrial fibrillation: Secondary | ICD-10-CM | POA: Insufficient documentation

## 2020-04-10 DIAGNOSIS — N1832 Chronic kidney disease, stage 3b: Secondary | ICD-10-CM | POA: Insufficient documentation

## 2020-04-10 DIAGNOSIS — Z79899 Other long term (current) drug therapy: Secondary | ICD-10-CM | POA: Diagnosis not present

## 2020-04-10 DIAGNOSIS — Z7982 Long term (current) use of aspirin: Secondary | ICD-10-CM | POA: Diagnosis not present

## 2020-04-10 DIAGNOSIS — I5022 Chronic systolic (congestive) heart failure: Secondary | ICD-10-CM | POA: Insufficient documentation

## 2020-04-10 DIAGNOSIS — I428 Other cardiomyopathies: Secondary | ICD-10-CM | POA: Diagnosis not present

## 2020-04-10 DIAGNOSIS — Z87891 Personal history of nicotine dependence: Secondary | ICD-10-CM | POA: Diagnosis not present

## 2020-04-10 LAB — BRAIN NATRIURETIC PEPTIDE: B Natriuretic Peptide: 181.8 pg/mL — ABNORMAL HIGH (ref 0.0–100.0)

## 2020-04-10 LAB — BASIC METABOLIC PANEL
Anion gap: 11 (ref 5–15)
BUN: 20 mg/dL (ref 8–23)
CO2: 21 mmol/L — ABNORMAL LOW (ref 22–32)
Calcium: 9 mg/dL (ref 8.9–10.3)
Chloride: 102 mmol/L (ref 98–111)
Creatinine, Ser: 1.57 mg/dL — ABNORMAL HIGH (ref 0.61–1.24)
GFR calc non Af Amer: 42 mL/min — ABNORMAL LOW (ref >60)
Glucose, Bld: 92 mg/dL (ref 70–99)
Potassium: 5.2 mmol/L — ABNORMAL HIGH (ref 3.5–5.1)
Sodium: 134 mmol/L — ABNORMAL LOW (ref 135–145)

## 2020-04-10 LAB — CBC
HCT: 46.4 % (ref 39.0–52.0)
Hemoglobin: 15.2 g/dL (ref 13.0–17.0)
MCH: 30.7 pg (ref 26.0–34.0)
MCHC: 32.8 g/dL (ref 30.0–36.0)
MCV: 93.7 fL (ref 80.0–100.0)
Platelets: 277 10*3/uL (ref 150–400)
RBC: 4.95 MIL/uL (ref 4.22–5.81)
RDW: 14.6 % (ref 11.5–15.5)
WBC: 4.8 10*3/uL (ref 4.0–10.5)
nRBC: 0 % (ref 0.0–0.2)

## 2020-04-10 NOTE — Patient Instructions (Addendum)
STOP KLOR Con  Labs done today, your results will be available in MyChart, we will contact you for abnormal readings.  Please call our office in March 2022 to schedule your follow up appointment  If you have any questions or concerns before your next appointment please send [place] a message through [place] or call our office at [phone].    TO LEAVE A MESSAGE FOR THE NURSE SELECT OPTION 2, PLEASE LEAVE A MESSAGE INCLUDING: . YOUR NAME . DATE OF BIRTH . CALL BACK NUMBER . REASON FOR CALL**this is important as we prioritize the call backs  YOU WILL RECEIVE A CALL BACK THE SAME DAY AS LONG AS YOU CALL BEFORE 4:00 PM

## 2020-04-10 NOTE — Addendum Note (Signed)
Encounter addended by: Samara Snide, RN on: 04/10/2020 12:31 PM  Actions taken: Medication long-term status modified, Order list changed, Diagnosis association updated, Charge Capture section accepted, Clinical Note Signed

## 2020-04-15 ENCOUNTER — Ambulatory Visit: Payer: Medicare Other

## 2020-04-29 ENCOUNTER — Ambulatory Visit: Payer: Medicare Other

## 2020-04-29 DIAGNOSIS — Z952 Presence of prosthetic heart valve: Secondary | ICD-10-CM

## 2020-04-29 LAB — POCT INR: INR: 2.4 (ref 2.0–3.0)

## 2020-04-29 NOTE — Progress Notes (Signed)
Anticoagulation Management [name] is a 74 y.o. male who reports to the clinic for monitoring of warfarin treatment.    Indication: hx of mechanical mitral valve replacement  Duration: indefinite Supervising physician: Debe Coder  Anticoagulation Clinic Visit History: Patient does not report signs/symptoms of bleeding or thromboembolism  Other recent changes: no diet, medications, lifestyle changes endorsed by the patient Anticoagulation Episode Summary    Current INR goal:  2.5-3.5  TTR:  63.1 % (7.6 y)  Next INR check:  05/20/2020  INR from last check:  2.4 (04/29/2020)  Weekly max warfarin dose:    Target end date:  Indefinite  INR check location:  Anticoagulation Clinic  Preferred lab:    Send INR reminders to:  ANTICOAG IMP   Indications   IRREGULAR PULSE [I49.9] Long term (current) use of anticoagulants (Resolved) [Z79.01] ATRIAL FLUTTER (Resolved) [I48.92] H/O mitral valve replacement with mechanical valve [Z95.2]       Comments:        Anticoagulation Care Providers    Provider Role Specialty Phone number   [name], MD  Cardiology [phone]      No Known Allergies  Current Outpatient Medications:  .  aspirin EC 81 MG tablet, Take 1 tablet (81 mg total) by mouth daily., Disp: 90 tablet, Rfl: 3 .  carvedilol (COREG) 6.25 MG tablet, TAKE 1 TABLET BY MOUTH 2 TIMES DAILY WITH A MEAL., Disp: 180 tablet, Rfl: 1 .  furosemide (LASIX) 20 MG tablet, Take 1 tablet (20 mg total) by mouth daily. (Patient not taking: Reported on 04/10/2020), Disp: 90 tablet, Rfl: 3 .  lisinopril (ZESTRIL) 2.5 MG tablet, Take 1 tablet (2.5 mg total) by mouth daily., Disp: 90 tablet, Rfl: 2 .  spironolactone (ALDACTONE) 25 MG tablet, Take 1 tablet (25 mg total) by mouth daily., Disp: 90 tablet, Rfl: 3 .  tamsulosin (FLOMAX) 0.4 MG CAPS capsule, TAKE 2 CAPSULES (0.8 MG TOTAL) BY MOUTH DAILY AFTER BREAKFAST., Disp: 180 capsule, Rfl: 1 .  warfarin (COUMADIN) 5 MG tablet, 1  TABLET BY MOUTH DAILY AT 6PM ALL DAYS OF WEEK EXCEPT TUES ONLY TAKE 1/2 TABLET, Disp: 72 tablet, Rfl: 1 Past Medical History:  Diagnosis Date  . Atrial fibrillation (HCC)    post op.  s/p dc-cv 04/2008. previously on amiodarone  . Atrial flutter (HCC)    atypical  . AV block, 1st degree    .450 msec--progressive  . 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  a- Echo 07/2008 Ef 55% mild peroprosthetic MVR with High transmitral gradient (mean 14).  b- normal coronaries by cath 12/2007  . BPH (benign prostatic hyperplasia)   . CHF (congestive heart failure) (HCC)    EF 35-40 % 2011 due to valvular disease and diastolic dysfunction  . Chronic back pain   . CKD (chronic kidney disease), stage III   . Dysphagia    with normal barium swallow 09/2008  . Endocarditis   . GERD (gastroesophageal reflux disease)   . Heavy alcohol use    history  . History of GI bleed   . Hypertension   . IV drug abuse (HCC)    history of  . Lower GI bleed 01/2016  . Pacemaker 2010   Social History   Socioeconomic History  . Marital status: Divorced    Spouse name: Not on file  . Number of children: 9  . Years of education: Not on file  . Highest education level: Not on  file  Occupational History  . Occupation: retired    Comment: [employer]  Tobacco Use  . Smoking status: Former Smoker    Years: 0.00    Types: Cigarettes  . Smokeless tobacco: Never Used  Vaping Use  . Vaping Use: Never used  Substance and Sexual Activity  . Alcohol use: No    Alcohol/week: 0.0 standard drinks  . Drug use: No  . Sexual activity: Not Currently  Other Topics Concern  . Not on file  Social History Narrative   Current Social History 02/16/2019        Patient lives with family (baby's mama, daughter and 4 grandkids:  Two yo twins, 84 yo and 60 yo in a home which is 1 story. There are 4 steps with handrails up to the entrance the patient uses.       Patient's method of  transportation is via family member (baby's mama).      The highest level of education was high school diploma.      The patient currently retired.      Identified important Relationships are "My Baby's [name], [name]."       Pets : None       Interests / Fun: Sitting outdoors watching the birds and planes."       Exercise riding a bike 5 miles 3 days/week      Current Stressors: The grandkids       Religious / Personal Beliefs: "I believe in God."       L. Ducatte, RN, BSN       Social Determinants of Health   Financial Resource Strain:   . Difficulty of Paying Living Expenses: Not on file  Food Insecurity:   . Worried About Programme researcher, broadcasting/film/video in the Last Year: Not on file  . Ran Out of Food in the Last Year: Not on file  Transportation Needs:   . Lack of Transportation (Medical): Not on file  . Lack of Transportation (Non-Medical): Not on file  Physical Activity:   . Days of Exercise per Week: Not on file  . Minutes of Exercise per Session: Not on file  Stress:   . Feeling of Stress : Not on file  Social Connections:   . Frequency of Communication with Friends and Family: Not on file  . Frequency of Social Gatherings with Friends and Family: Not on file  . Attends Religious Services: Not on file  . Active Member of Clubs or Organizations: Not on file  . Attends Banker Meetings: Not on file  . Marital Status: Not on file   Family History  Problem Relation Age of Onset  . Coronary artery disease Mother   . Cancer Father        GI  . Cancer Brother        Lung    ASSESSMENT Recent Results: The most recent result is correlated with 27.5 mg per week: Lab Results  Component Value Date   INR 2.4 04/29/2020   INR 5.0 (A) 03/25/2020   INR 4.8 (A) 03/04/2020    Anticoagulation Dosing: Description   Take one (1) of your 5mg  peach-colored warfarin tablets by mouth, once-daily at [facility] on Sundays, Tuesdays, Wednesdays, Fridays, and Saturdays. Take  1/2 tablet on Mondays and Thursdays only.      INR today: Subtherapeutic  PLAN Weekly dose was increased by 9% to 30 mg per week  There are no Patient Instructions on file for this visit. Patient  advised to contact clinic or seek medical attention if signs/symptoms of bleeding or thromboembolism occur.  Patient verbalized understanding by repeating back information and was advised to contact me if further medication-related questions arise. Patient was also provided an information handout.  Follow-up Return in about 3 weeks (around 05/20/2020) for follow-up INR check at 10:00 am.  Rexford Maus, PharmD PGY-1 Acute Care Pharmacy Resident Office: [phone] 04/29/2020 10:53 AM  15 minutes spent face-to-face with the patient during the encounter. 50% of time spent on education, including signs/sx bleeding and clotting, as well as food and drug interactions with warfarin. 50% of time was spent on fingerprick POC INR sample collection,processing, results determination, and documentation in [email or web address].

## 2020-05-06 NOTE — Progress Notes (Signed)
I have reviewed the pharmacy note.  Patient is below goal for his INR.  His dose was increased.

## 2020-05-09 ENCOUNTER — Other Ambulatory Visit: Payer: Self-pay | Admitting: Internal Medicine

## 2020-05-09 DIAGNOSIS — I5022 Chronic systolic (congestive) heart failure: Secondary | ICD-10-CM

## 2020-05-09 DIAGNOSIS — N401 Enlarged prostate with lower urinary tract symptoms: Secondary | ICD-10-CM

## 2020-05-10 NOTE — Telephone Encounter (Signed)
tamsulosin (FLOMAX) 0.4 MG CAPS capsule, REFILL REQUEST @  [id number] - [place], [place] - [address] Phone:  [phone]  Fax:  [phone]     Pt would like the med to be filled by today.

## 2020-05-10 NOTE — Telephone Encounter (Signed)
Need refill on tamsulosin (FLOMAX) 0.4 MG CAPS capsule ;pt contact [phone]  [id number] - [place], [place] - [address]

## 2020-05-20 ENCOUNTER — Ambulatory Visit (INDEPENDENT_AMBULATORY_CARE_PROVIDER_SITE_OTHER): Payer: Medicare Other

## 2020-05-20 DIAGNOSIS — Z23 Encounter for immunization: Secondary | ICD-10-CM

## 2020-05-20 DIAGNOSIS — Z952 Presence of prosthetic heart valve: Secondary | ICD-10-CM

## 2020-05-20 LAB — POCT INR: INR: 2.7 (ref 2.0–3.0)

## 2020-05-20 NOTE — Progress Notes (Signed)
Anticoagulation Management [name] is a 74 y.o. male who reports to the clinic for monitoring of warfarin treatment.    Indication: history of mitral valve replacement with mechanical valve  Duration: indefinite Supervising physician: Jessy Oto  Anticoagulation Clinic Visit History: Patient does not report signs/symptoms of bleeding or thromboembolism  Other recent changes: denies any recent diet, medications, or lifestyle changes. Anticoagulation Episode Summary    Current INR goal:  2.5-3.5  TTR:  63.1 % (7.7 y)  Next INR check:  06/17/2020  INR from last check:  2.7 (05/20/2020)  Weekly max warfarin dose:    Target end date:  Indefinite  INR check location:  Anticoagulation Clinic  Preferred lab:    Send INR reminders to:  ANTICOAG IMP   Indications   IRREGULAR PULSE [I49.9] Long term (current) use of anticoagulants (Resolved) [Z79.01] ATRIAL FLUTTER (Resolved) [I48.92] H/O mitral valve replacement with mechanical valve [Z95.2]       Comments:        Anticoagulation Care Providers    Provider Role Specialty Phone number   [name], MD  Cardiology [phone]      No Known Allergies  Current Outpatient Medications:  .  tamsulosin (FLOMAX) 0.4 MG CAPS capsule, TAKE 2 CAPSULES (0.8 MG TOTAL) BY MOUTH DAILY AFTER BREAKFAST., Disp: 180 capsule, Rfl: 1 .  aspirin EC 81 MG tablet, Take 1 tablet (81 mg total) by mouth daily., Disp: 90 tablet, Rfl: 3 .  carvedilol (COREG) 6.25 MG tablet, TAKE 1 TABLET BY MOUTH 2 TIMES DAILY WITH A MEAL., Disp: 180 tablet, Rfl: 1 .  furosemide (LASIX) 20 MG tablet, Take 1 tablet (20 mg total) by mouth daily. (Patient not taking: Reported on 04/10/2020), Disp: 90 tablet, Rfl: 3 .  lisinopril (ZESTRIL) 2.5 MG tablet, Take 1 tablet (2.5 mg total) by mouth daily., Disp: 90 tablet, Rfl: 2 .  spironolactone (ALDACTONE) 25 MG tablet, Take 1 tablet (25 mg total) by mouth daily., Disp: 90 tablet, Rfl: 3 .  warfarin (COUMADIN) 5  MG tablet, 1 TABLET BY MOUTH DAILY AT 6PM ALL DAYS OF WEEK EXCEPT TUES ONLY TAKE 1/2 TABLET, Disp: 72 tablet, Rfl: 1 Past Medical History:  Diagnosis Date  . Atrial fibrillation (HCC)    post op.  s/p dc-cv 04/2008. previously on amiodarone  . Atrial flutter (HCC)    atypical  . AV block, 1st degree    .450 msec--progressive  . 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  a- Echo 07/2008 Ef 55% mild peroprosthetic MVR with High transmitral gradient (mean 14).  b- normal coronaries by cath 12/2007  . BPH (benign prostatic hyperplasia)   . CHF (congestive heart failure) (HCC)    EF 35-40 % 2011 due to valvular disease and diastolic dysfunction  . Chronic back pain   . CKD (chronic kidney disease), stage III   . Dysphagia    with normal barium swallow 09/2008  . Endocarditis   . GERD (gastroesophageal reflux disease)   . Heavy alcohol use    history  . History of GI bleed   . Hypertension   . IV drug abuse (HCC)    history of  . Lower GI bleed 01/2016  . Pacemaker 2010   Social History   Socioeconomic History  . Marital status: Divorced    Spouse name: Not on file  . Number of children: 9  . Years of education: Not on file  . Highest education level: Not  on file  Occupational History  . Occupation: retired    Comment: [employer]  Tobacco Use  . Smoking status: Former Smoker    Years: 0.00    Types: Cigarettes  . Smokeless tobacco: Never Used  Vaping Use  . Vaping Use: Never used  Substance and Sexual Activity  . Alcohol use: No    Alcohol/week: 0.0 standard drinks  . Drug use: No  . Sexual activity: Not Currently  Other Topics Concern  . Not on file  Social History Narrative   Current Social History 02/16/2019        Patient lives with family (baby's mama, daughter and 4 grandkids:  Two yo twins, 88 yo and 85 yo in a home which is 1 story. There are 4 steps with handrails up to the entrance the patient uses.       Patient's method  of transportation is via family member (baby's mama).      The highest level of education was high school diploma.      The patient currently retired.      Identified important Relationships are "My Baby's [name], [name]."       Pets : None       Interests / Fun: Sitting outdoors watching the birds and planes."       Exercise riding a bike 5 miles 3 days/week      Current Stressors: The grandkids       Religious / Personal Beliefs: "I believe in God."       L. Ducatte, RN, BSN       Social Determinants of Health   Financial Resource Strain:   . Difficulty of Paying Living Expenses: Not on file  Food Insecurity:   . Worried About Programme researcher, broadcasting/film/video in the Last Year: Not on file  . Ran Out of Food in the Last Year: Not on file  Transportation Needs:   . Lack of Transportation (Medical): Not on file  . Lack of Transportation (Non-Medical): Not on file  Physical Activity:   . Days of Exercise per Week: Not on file  . Minutes of Exercise per Session: Not on file  Stress:   . Feeling of Stress : Not on file  Social Connections:   . Frequency of Communication with Friends and Family: Not on file  . Frequency of Social Gatherings with Friends and Family: Not on file  . Attends Religious Services: Not on file  . Active Member of Clubs or Organizations: Not on file  . Attends Banker Meetings: Not on file  . Marital Status: Not on file   Family History  Problem Relation Age of Onset  . Coronary artery disease Mother   . Cancer Father        GI  . Cancer Brother        Lung    ASSESSMENT Recent Results: The most recent result is correlated with 30 mg per week: Lab Results  Component Value Date   INR 2.7 05/20/2020   INR 2.4 04/29/2020   INR 5.0 (A) 03/25/2020    Anticoagulation Dosing: Description   Take one (1) of your 5mg  peach-colored warfarin tablets by mouth, once-daily at [facility] on Sundays, Tuesdays, Wednesdays, Fridays, and Saturdays. Take 1/2  tablet on Mondays and Thursdays only.      INR today: Therapeutic  PLAN Weekly dose was unchanged  per week  There are no Patient Instructions on file for this visit. Patient advised to contact clinic  or seek medical attention if signs/symptoms of bleeding or thromboembolism occur.  Patient verbalized understanding by repeating back information and was advised to contact me if further medication-related questions arise. Patient was also provided an information handout.  Follow-up Return in about 4 weeks (around 06/17/2020) for Follow-up INR at 10:00 am.  Rexford Maus, PharmD PGY-1 Acute Care Pharmacy Resident 05/20/2020 10:28 AM   15 minutes spent face-to-face with the patient during the encounter. 50% of time spent on education, including signs/sx bleeding and clotting, as well as food and drug interactions with warfarin. 50% of time was spent on fingerprick POC INR sample collection,processing, results determination, and documentation in [email or web address].

## 2020-05-20 NOTE — Progress Notes (Signed)
INTERNAL MEDICINE TEACHING ATTENDING ADDENDUM  I agree with pharmacy recommendations as outlined in their note.   Keitha Kolk N Garvin Ellena, MD  

## 2020-05-28 ENCOUNTER — Ambulatory Visit (INDEPENDENT_AMBULATORY_CARE_PROVIDER_SITE_OTHER): Payer: Medicare Other

## 2020-05-28 DIAGNOSIS — I5022 Chronic systolic (congestive) heart failure: Secondary | ICD-10-CM

## 2020-05-29 LAB — CUP PACEART REMOTE DEVICE CHECK
Battery Remaining Longevity: 97 mo
Battery Remaining Percentage: 95.5 %
Battery Voltage: 2.99 V
Date Time Interrogation Session: 20211124040014
Implantable Lead Implant Date: 20120905
Implantable Lead Implant Date: 20120905
Implantable Lead Implant Date: 20120905
Implantable Lead Location: 753858
Implantable Lead Location: 753859
Implantable Lead Location: 753860
Implantable Pulse Generator Implant Date: 20190610
Lead Channel Impedance Value: 410 Ohm
Lead Channel Impedance Value: 700 Ohm
Lead Channel Pacing Threshold Amplitude: 1 V
Lead Channel Pacing Threshold Amplitude: 1.5 V
Lead Channel Pacing Threshold Pulse Width: 0.5 ms
Lead Channel Pacing Threshold Pulse Width: 0.6 ms
Lead Channel Sensing Intrinsic Amplitude: 12 mV
Lead Channel Setting Pacing Amplitude: 2 V
Lead Channel Setting Pacing Amplitude: 2.5 V
Lead Channel Setting Pacing Pulse Width: 0.5 ms
Lead Channel Setting Pacing Pulse Width: 0.6 ms
Lead Channel Setting Sensing Sensitivity: 2 mV
Pulse Gen Model: 3222
Pulse Gen Serial Number: 9022287

## 2020-06-04 NOTE — Progress Notes (Signed)
Remote pacemaker transmission.   

## 2020-06-17 ENCOUNTER — Ambulatory Visit: Payer: Medicare Other

## 2020-06-24 ENCOUNTER — Ambulatory Visit (INDEPENDENT_AMBULATORY_CARE_PROVIDER_SITE_OTHER): Payer: Medicare Other | Admitting: Pharmacist

## 2020-06-24 DIAGNOSIS — Z952 Presence of prosthetic heart valve: Secondary | ICD-10-CM

## 2020-06-24 DIAGNOSIS — Z7901 Long term (current) use of anticoagulants: Secondary | ICD-10-CM

## 2020-06-24 LAB — POCT INR: INR: 3 (ref 2.0–3.0)

## 2020-06-24 NOTE — Patient Instructions (Signed)
Patient instructed to take medications as defined in the Anti-coagulation Track section of this encounter.  Patient instructed to take today's dose.  Patient instructed to take  one (1) of your 5mg  peach-colored warfarin tablets by mouth, once-daily at [facility] on Sundays, Tuesdays, Wednesdays, Fridays, and Saturdays. Take 1/2 tablet on Mondays and Thursdays only.  Patient verbalized understanding of these instructions.

## 2020-06-24 NOTE — Progress Notes (Signed)
Anticoagulation Management [name] is a 74 y.o. male who reports to the clinic for monitoring of warfarin treatment.    Indication: Atrial flutter; H/O mitral valve replacement, mechanical; long term current use of oral anticoagulant, warfarin, to INR 2.5 - 3.5   Duration: indefinite Supervising physician: Carlynn Purl  Anticoagulation Clinic Visit History: Patient does not report signs/symptoms of bleeding or thromboembolism  Other recent changes: No diet, medications, lifestyle changes endorsed by patient at this visit.  Anticoagulation Episode Summary    Current INR goal:  2.5-3.5  TTR:  63.6 % (7.8 y)  Next INR check:  07/15/2020  INR from last check:  3.0 (06/24/2020)  Weekly max warfarin dose:    Target end date:  Indefinite  INR check location:  Anticoagulation Clinic  Preferred lab:    Send INR reminders to:  ANTICOAG IMP   Indications   IRREGULAR PULSE [I49.9] Long term (current) use of anticoagulants (Resolved) [Z79.01] ATRIAL FLUTTER (Resolved) [I48.92] H/O mitral valve replacement with mechanical valve [Z95.2]       Comments:        Anticoagulation Care Providers    Provider Role Specialty Phone number   [name], MD  Cardiology [phone]      No Known Allergies  Current Outpatient Medications:  .  aspirin EC 81 MG tablet, Take 1 tablet (81 mg total) by mouth daily., Disp: 90 tablet, Rfl: 3 .  carvedilol (COREG) 6.25 MG tablet, TAKE 1 TABLET BY MOUTH 2 TIMES DAILY WITH A MEAL., Disp: 180 tablet, Rfl: 1 .  furosemide (LASIX) 20 MG tablet, Take 1 tablet (20 mg total) by mouth daily., Disp: 90 tablet, Rfl: 3 .  lisinopril (ZESTRIL) 2.5 MG tablet, Take 1 tablet (2.5 mg total) by mouth daily., Disp: 90 tablet, Rfl: 2 .  spironolactone (ALDACTONE) 25 MG tablet, Take 1 tablet (25 mg total) by mouth daily., Disp: 90 tablet, Rfl: 3 .  tamsulosin (FLOMAX) 0.4 MG CAPS capsule, TAKE 2 CAPSULES (0.8 MG TOTAL) BY MOUTH DAILY AFTER BREAKFAST., Disp: 180  capsule, Rfl: 1 .  warfarin (COUMADIN) 5 MG tablet, 1 TABLET BY MOUTH DAILY AT 6PM ALL DAYS OF WEEK EXCEPT TUES ONLY TAKE 1/2 TABLET, Disp: 72 tablet, Rfl: 1 Past Medical History:  Diagnosis Date  . Atrial fibrillation (HCC)    post op.  s/p dc-cv 04/2008. previously on amiodarone  . Atrial flutter (HCC)    atypical  . AV block, 1st degree    .450 msec--progressive  . 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  a- Echo 07/2008 Ef 55% mild peroprosthetic MVR with High transmitral gradient (mean 14).  b- normal coronaries by cath 12/2007  . BPH (benign prostatic hyperplasia)   . CHF (congestive heart failure) (HCC)    EF 35-40 % 2011 due to valvular disease and diastolic dysfunction  . Chronic back pain   . CKD (chronic kidney disease), stage III   . Dysphagia    with normal barium swallow 09/2008  . Endocarditis   . GERD (gastroesophageal reflux disease)   . Heavy alcohol use    history  . History of GI bleed   . Hypertension   . IV drug abuse (HCC)    history of  . Lower GI bleed 01/2016  . Pacemaker 2010   Social History   Socioeconomic History  . Marital status: Divorced    Spouse name: Not on file  . Number of children: 9  . Years  of education: Not on file  . Highest education level: Not on file  Occupational History  . Occupation: retired    Comment: [employer]  Tobacco Use  . Smoking status: Former Smoker    Years: 0.00    Types: Cigarettes  . Smokeless tobacco: Never Used  Vaping Use  . Vaping Use: Never used  Substance and Sexual Activity  . Alcohol use: No    Alcohol/week: 0.0 standard drinks  . Drug use: No  . Sexual activity: Not Currently  Other Topics Concern  . Not on file  Social History Narrative   Current Social History 02/16/2019        Patient lives with family (baby's mama, daughter and 4 grandkids:  Two yo twins, 93 yo and 52 yo in a home which is 1 story. There are 4 steps with handrails up to the entrance  the patient uses.       Patient's method of transportation is via family member (baby's mama).      The highest level of education was high school diploma.      The patient currently retired.      Identified important Relationships are "My Baby's [name], [name]."       Pets : None       Interests / Fun: Sitting outdoors watching the birds and planes."       Exercise riding a bike 5 miles 3 days/week      Current Stressors: The grandkids       Religious / Personal Beliefs: "I believe in God."       L. Ducatte, RN, BSN       Social Determinants of Health   Financial Resource Strain: Not on file  Food Insecurity: Not on file  Transportation Needs: Not on file  Physical Activity: Not on file  Stress: Not on file  Social Connections: Not on file   Family History  Problem Relation Age of Onset  . Coronary artery disease Mother   . Cancer Father        GI  . Cancer Brother        Lung    ASSESSMENT Recent Results: The most recent result is correlated with 30 mg per week: Lab Results  Component Value Date   INR 3.0 06/24/2020   INR 2.7 05/20/2020   INR 2.4 04/29/2020    Anticoagulation Dosing: Description   Take one (1) of your 5mg  peach-colored warfarin tablets by mouth, once-daily at [facility] on Sundays, Tuesdays, Wednesdays, Fridays, and Saturdays. Take 1/2 tablet on Mondays and Thursdays only.      INR today: Therapeutic  PLAN Weekly dose was unchanged.   Patient Instructions  Patient instructed to take medications as defined in the Anti-coagulation Track section of this encounter.  Patient instructed to take today's dose.  Patient instructed to take  one (1) of your 5mg  peach-colored warfarin tablets by mouth, once-daily at [facility] on Sundays, Tuesdays, Wednesdays, Fridays, and Saturdays. Take 1/2 tablet on Mondays and Thursdays only.  Patient verbalized understanding of these instructions.    Patient advised to contact clinic or seek medical attention if  signs/symptoms of bleeding or thromboembolism occur.  Patient verbalized understanding by repeating back information and was advised to contact me if further medication-related questions arise. Patient was also provided an information handout.  Follow-up Return in 3 weeks (on 07/15/2020) for Follow up INR.  12-06-1981, PharmD, CPP  15 minutes spent face-to-face with the patient during the encounter. 50%  of time spent on education, including signs/sx bleeding and clotting, as well as food and drug interactions with warfarin. 50% of time was spent on fingerprick POC INR sample collection,processing, results determination, and documentation in [email or web address].

## 2020-07-15 ENCOUNTER — Ambulatory Visit: Payer: Medicare Other

## 2020-07-24 ENCOUNTER — Ambulatory Visit (INDEPENDENT_AMBULATORY_CARE_PROVIDER_SITE_OTHER): Payer: Medicare Other | Admitting: Student

## 2020-07-24 ENCOUNTER — Encounter: Payer: Self-pay | Admitting: Student

## 2020-07-24 ENCOUNTER — Other Ambulatory Visit: Payer: Self-pay

## 2020-07-24 DIAGNOSIS — U071 COVID-19: Secondary | ICD-10-CM

## 2020-07-24 HISTORY — DX: COVID-19: U07.1

## 2020-07-24 NOTE — Progress Notes (Signed)
   CC:   This is a telephone encounter between [name] and [name] on 07/24/2020 for positive COVID test. The visit was conducted with the patient located at home and [name] at [facility]. The patient's identity was confirmed using their DOB and current address. The patient has consented to being evaluated through a telephone encounter and understands the associated risks (an examination cannot be done and the patient may need to come in for an appointment) / benefits (allows the patient to remain at home, decreasing exposure to coronavirus). I personally spent 13 minutes on medical discussion.   HPI:  [name] is a 75 y.o. with PMH as below. Please see A&P for assessment of the patient's acute and chronic medical conditions.   Past Medical History:  Diagnosis Date  . Atrial fibrillation (HCC)    post op.  s/p dc-cv 04/2008. previously on amiodarone  . Atrial flutter (HCC)    atypical  . AV block, 1st degree    .450 msec--progressive  . 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  a- Echo 07/2008 Ef 55% mild peroprosthetic MVR with High transmitral gradient (mean 14).  b- normal coronaries by cath 12/2007  . BPH (benign prostatic hyperplasia)   . CHF (congestive heart failure) (HCC)    EF 35-40 % 2011 due to valvular disease and diastolic dysfunction  . Chronic back pain   . CKD (chronic kidney disease), stage III   . Dysphagia    with normal barium swallow 09/2008  . Endocarditis   . GERD (gastroesophageal reflux disease)   . Heavy alcohol use    history  . History of GI bleed   . Hypertension   . IV drug abuse (HCC)    history of  . Lower GI bleed 01/2016  . Pacemaker 2010   Review of Systems:   Constitutional: Negative for fever, chills or fatigue HEENT: Negative for sore throat, blurry vision, loss of smell or taste.  Positive for runny nose.  Respiratory: Negative for shortness of breath.  Positive for dry  cough. Cardiac: Negative for chest pain Abdomen: Negative for nausea, vomiting or diarrhea. Neuro: Positive for occasional headaches. Negative for dizziness or weakness.    Assessment & Plan:   See Encounters Tab for problem based charting.  Patient discussed with Dr. West Bali, MD, MPH

## 2020-07-24 NOTE — Assessment & Plan Note (Addendum)
This encounter was completed through a virtual visit by phone. Patient states he and his wife occasionally get tested for COVID. States his daughter tested positive for COVID 2 weeks ago and a week ago, he and his wife started having symptoms.  They were tested on January 11th and the test came back positive.  His wife only had headaches but patient endorsed dry cough, runny nose and occasional headaches. He denies any fevers, chills, sore throat, loss of taste or smell, shortness of breath, chest pain, N/V or diarrhea.  They have been isolated at home since last week. He has taken Alka-Seltzer plus cold and flu with some improvement in his symptoms. He and his wife received the Pfizer vaccine and had their booster shots earlier this month. Currently patient states he is doing better but still have occasional dry cough and headache. Patient was advised to try OTC Mucinex to help with the cough Patient has cleared the 5-day isolation period but with symptoms not completely resolved, I advised him to continue to isolate at home until the weekend. --OTC Mucinex as needed for cough --Advised to go to the ED if symptom worsens

## 2020-07-25 NOTE — Progress Notes (Signed)
Internal Medicine Clinic Attending  Case discussed with Dr. Amponsah  At the time of the visit.  We reviewed the resident's history and exam and pertinent patient test results.  I agree with the assessment, diagnosis, and plan of care documented in the resident's note.  

## 2020-08-03 ENCOUNTER — Other Ambulatory Visit: Payer: Self-pay | Admitting: Internal Medicine

## 2020-08-07 ENCOUNTER — Other Ambulatory Visit: Payer: Self-pay

## 2020-08-07 ENCOUNTER — Ambulatory Visit (INDEPENDENT_AMBULATORY_CARE_PROVIDER_SITE_OTHER): Payer: 59 | Admitting: Internal Medicine

## 2020-08-07 ENCOUNTER — Encounter: Payer: Self-pay | Admitting: Internal Medicine

## 2020-08-07 ENCOUNTER — Ambulatory Visit (HOSPITAL_COMMUNITY)
Admission: RE | Admit: 2020-08-07 | Discharge: 2020-08-07 | Disposition: A | Discharge status: Home / Self Care | Payer: 59 | Source: Ambulatory Visit | Attending: Internal Medicine | Admitting: Internal Medicine

## 2020-08-07 VITALS — BP 110/72 | HR 75 | Temp 98.4°F | Ht 68.0 in | Wt 184.4 lb

## 2020-08-07 DIAGNOSIS — M79672 Pain in left foot: Secondary | ICD-10-CM | POA: Insufficient documentation

## 2020-08-07 DIAGNOSIS — Z952 Presence of prosthetic heart valve: Secondary | ICD-10-CM

## 2020-08-07 MED ORDER — PREDNISONE 20 MG PO TABS: 40.0000 mg | ORAL_TABLET | Freq: Every day | ORAL | 0 refills | Status: DC

## 2020-08-07 NOTE — Progress Notes (Addendum)
Acute Office Visit  Subjective:    Patient ID: [name], male    DOB: [birth year], 75 y.o.   MRN: [id number]  Chief Complaint  Patient presents with  . Pain    LEFT FOOT PAIN X 1 WEEK  (HIT FOOT0    HPI Patient is in today for left foot pain after hitting his toes on base of the stairs about a week ago. Please refer to problem based charting for further details and assessment and plan of current problem and chronic medical conditions.   PPMHx: HTN, CKD, CHF, s/p pacemake, BPH, H/o mitral mechanical valve replacement, recent  COVID-19 infection Medications: ASA, Lasix 20 mg, Warfarin, coreg, Tamsulosin, spironolactone, lisinopril 2.5 mg  Past Medical History:  Diagnosis Date  . Atrial fibrillation (HCC)    post op.  s/p dc-cv 04/2008. previously on amiodarone  . Atrial flutter (HCC)    atypical  . AV block, 1st degree    .450 msec--progressive  . 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  a- Echo 07/2008 Ef 55% mild peroprosthetic MVR with High transmitral gradient (mean 14).  b- normal coronaries by cath 12/2007  . BPH (benign prostatic hyperplasia)   . CHF (congestive heart failure) (HCC)    EF 35-40 % 2011 due to valvular disease and diastolic dysfunction  . Chronic back pain   . CKD (chronic kidney disease), stage III (HCC)   . Dysphagia    with normal barium swallow 09/2008  . Endocarditis   . GERD (gastroesophageal reflux disease)   . Heavy alcohol use    history  . History of GI bleed   . Hypertension   . IV drug abuse (HCC)    history of  . Lower GI bleed 01/2016  . Pacemaker 2010    Past Surgical History:  Procedure Laterality Date  . BIV PACEMAKER GENERATOR CHANGEOUT N/A 12/13/2017   Procedure: BIV PACEMAKER GENERATOR CHANGEOUT;  Surgeon: Duke Salvia, MD;  Location: [facility] INVASIVE CV LAB;  Service: Cardiovascular;  Laterality: N/A;  . CARDIAC VALVE REPLACEMENT     mitral valve, st jude mechanical   . COLONOSCOPY  N/A 03/26/2014   Procedure: COLONOSCOPY;  Surgeon: Rachael Fee, MD;  Location: [facility] ENDOSCOPY;  Service: Endoscopy;  Laterality: N/A;  . ENTEROSCOPY N/A 03/28/2014   Procedure: ENTEROSCOPY;  Surgeon: Rachael Fee, MD;  Location: [facility] ENDOSCOPY;  Service: Endoscopy;  Laterality: N/A;  . ESOPHAGOGASTRODUODENOSCOPY N/A 03/25/2014   Procedure: ESOPHAGOGASTRODUODENOSCOPY (EGD);  Surgeon: Iva Boop, MD;  Location: [facility] ENDOSCOPY;  Service: Endoscopy;  Laterality: N/A;  . GIVENS CAPSULE STUDY N/A 03/26/2014   Procedure: GIVENS CAPSULE STUDY;  Surgeon: Rachael Fee, MD;  Location: [facility] ENDOSCOPY;  Service: Endoscopy;  Laterality: N/A;  . PACEMAKER INSERTION     inserted about 4-5 years ago    Family History  Problem Relation Age of Onset  . Coronary artery disease Mother   . Cancer Father        GI  . Cancer Brother        Lung    Social History   Socioeconomic History  . Marital status: Divorced    Spouse name: Not on file  . Number of children: 9  . Years of education: Not on file  . Highest education level: Not on file  Occupational History  . Occupation: retired    Comment: [employer]  Tobacco Use  . Smoking status: Former Smoker  Years: 0.00    Types: Cigarettes  . Smokeless tobacco: Never Used  Vaping Use  . Vaping Use: Never used  Substance and Sexual Activity  . Alcohol use: No    Alcohol/week: 0.0 standard drinks  . Drug use: No  . Sexual activity: Not Currently  Other Topics Concern  . Not on file  Social History Narrative   Current Social History 02/16/2019        Patient lives with family (baby's mama, daughter and 4 grandkids:  Two yo twins, 31 yo and 46 yo in a home which is 1 story. There are 4 steps with handrails up to the entrance the patient uses.       Patient's method of transportation is via family member (baby's mama).      The highest level of education was high school diploma.      The patient currently retired.      Identified important  Relationships are "My Baby's [name], [name]."       Pets : None       Interests / Fun: Sitting outdoors watching the birds and planes."       Exercise riding a bike 5 miles 3 days/week      Current Stressors: The grandkids       Religious / Personal Beliefs: "I believe in God."       L. Ducatte, RN, BSN       Social Determinants of Health   Financial Resource Strain: Not on file  Food Insecurity: Not on file  Transportation Needs: Not on file  Physical Activity: Not on file  Stress: Not on file  Social Connections: Not on file  Intimate Partner Violence: Not on file    Outpatient Medications Prior to Visit  Medication Sig Dispense Refill  . aspirin EC 81 MG tablet Take 1 tablet (81 mg total) by mouth daily. 90 tablet 3  . carvedilol (COREG) 6.25 MG tablet TAKE 1 TABLET BY MOUTH 2 TIMES DAILY WITH A MEAL. 180 tablet 1  . furosemide (LASIX) 20 MG tablet Take 1 tablet (20 mg total) by mouth daily. 90 tablet 3  . lisinopril (ZESTRIL) 2.5 MG tablet Take 1 tablet (2.5 mg total) by mouth daily. 90 tablet 2  . spironolactone (ALDACTONE) 25 MG tablet Take 1 tablet (25 mg total) by mouth daily. 90 tablet 3  . tamsulosin (FLOMAX) 0.4 MG CAPS capsule TAKE 2 CAPSULES (0.8 MG TOTAL) BY MOUTH DAILY AFTER BREAKFAST. 180 capsule 1  . warfarin (COUMADIN) 5 MG tablet 1 TABLET BY MOUTH DAILY AT 6PM ALL DAYS OF WEEK EXCEPT TUES ONLY TAKE 1/2 TABLET 72 tablet 1   No facility-administered medications prior to visit.    No Known Allergies  Review of Systems     Objective:    Physical Exam Constitutional:      General: He is not in acute distress.    Appearance: Normal appearance. He is not ill-appearing.  Cardiovascular:     Rate and Rhythm: Normal rate and regular rhythm.     Pulses: Normal pulses.     Heart sounds: No murmur heard.   Pulmonary:     Effort: Pulmonary effort is normal.     Breath sounds: No wheezing.     Comments: Trace bibasilar crackles Abdominal:      Palpations: Abdomen is soft.     Tenderness: There is no abdominal tenderness.  Musculoskeletal:        General: Swelling and tenderness present.  Comments: Left foot swelling. Tenderness at first metatarsal area and first toe  Skin:    General: Skin is warm.     Findings: No erythema.  Neurological:     Mental Status: He is alert and oriented to person, place, and time. Mental status is at baseline.     Sensory: No sensory deficit.  Psychiatric:        Mood and Affect: Mood normal.        Behavior: Behavior normal.     BP 110/72 (BP Location: Left Arm, Patient Position: Sitting, Cuff Size: Normal)   Pulse 75   Temp 98.4 F (36.9 C) (Oral)   Ht 5\' 8"  (1.727 m)   Wt 184 lb 6.4 oz (83.6 kg)   SpO2 99%   BMI 28.04 kg/m  Wt Readings from Last 3 Encounters:  08/07/20 184 lb 6.4 oz (83.6 kg)  04/10/20 182 lb 6.4 oz (82.7 kg)  01/15/20 181 lb (82.1 kg)   Physical Exam Constitutional:      General: He is not in acute distress.    Appearance: Normal appearance. He is not ill-appearing.  Cardiovascular:     Rate and Rhythm: Normal rate and regular rhythm.     Pulses: Normal pulses.     Heart sounds: No murmur heard.   Pulmonary:     Effort: Pulmonary effort is normal.     Breath sounds: No wheezing.     Comments: Trace bibasilar crackles Abdominal:     Palpations: Abdomen is soft.     Tenderness: There is no abdominal tenderness.  Musculoskeletal:        General: Swelling and tenderness present.     Comments: Left foot swelling. Tenderness at first metatarsal area and first toe  Skin:    General: Skin is warm.     Findings: No erythema.  Neurological:     Mental Status: He is alert and oriented to person, place, and time. Mental status is at baseline.     Sensory: No sensory deficit.  Psychiatric:        Mood and Affect: Mood normal.        Behavior: Behavior normal.       Health Maintenance Due  Topic Date Due  . COVID-19 Vaccine (3 - Booster for Pfizer  series) 04/04/2020    There are no preventive care reminders to display for this patient.   Lab Results  Component Value Date   TSH 1.060 03/26/2016   Lab Results  Component Value Date   WBC 4.8 04/10/2020   HGB 15.2 04/10/2020   HCT 46.4 04/10/2020   MCV 93.7 04/10/2020   PLT 277 04/10/2020   Lab Results  Component Value Date   NA 134 (L) 04/10/2020   K 5.2 (H) 04/10/2020   CO2 21 (L) 04/10/2020   GLUCOSE 92 04/10/2020   BUN 20 04/10/2020   CREATININE 1.57 (H) 04/10/2020   BILITOT 0.9 01/04/2020   ALKPHOS 54 01/04/2020   AST 31 01/04/2020   ALT 17 01/04/2020   PROT 7.4 01/04/2020   ALBUMIN 4.4 01/04/2020   CALCIUM 9.0 04/10/2020   ANIONGAP 11 04/10/2020   GFR 64.22 04/05/2012   Lab Results  Component Value Date   CHOL  12/15/2007    132        ATP III CLASSIFICATION:  <200     mg/dL   Desirable  02/14/2008  mg/dL   Borderline High  702-637    mg/dL   High   Lab Results  Component Value Date   HDL 24 (L) 12/15/2007   Lab Results  Component Value Date   LDLCALC  12/15/2007    90        Total Cholesterol/HDL:CHD Risk Coronary Heart Disease Risk Table                     Men   Women  1/2 Average Risk   3.4   3.3   Lab Results  Component Value Date   TRIG 91 12/15/2007   Lab Results  Component Value Date   CHOLHDL 5.5 12/15/2007   Lab Results  Component Value Date   HGBA1C  09/30/2007    5.3 (NOTE)   The ADA recommends the following therapeutic goals for glycemic   control related to Hgb A1C measurement:   Goal of Therapy:   < 7.0% Hgb A1C   Action Suggested:  > 8.0% Hgb A1C   Ref:  Diabetes Care, 22, Suppl. 1, 1999       Assessment & Plan:   Problem List Items Addressed This Visit      Other   Acute foot pain, left - Primary    Patient is in today for left foot pain after hitting his toes on base of the stairs about 5 days ago. He takes tylenol for pain with moderate relief. The pain bothers him at night. He uses cane chronically and able to  walk with that although he feels pain when walking on left foot. On exam, he has swelling of left foot. ROM of left ankle is intact but can not move his toes because of the pain. He has tenderness at first metatarsal area and first toe. Perfusion well on distal of left foot.  -Ordering left foot X ray to evaluate for Fx.  -Continue taking Tylenol PRN  ADDENDUM: X ray w/o evidence of Fx but with gout erosion. No prior Hx of gout. Will prescribe 5 day course of prednisone and will reassess his symptoms in 2-3 days. (no evidence of septic arthritis on evaluation today) If no improvement, will consider colchicine (his symptoms started 5 days ago). (Avoiding NSAID given CKD). Will check uric acid when the acute gout attack resolves.  Attempted to call patient. No answer. Left a message about the prescription and will try again tomorrow.       Relevant Medications   predniSONE (DELTASONE) 20 MG tablet   Other Relevant Orders   DG Foot Complete Left (Completed)   H/O mitral valve replacement with mechanical valve    Patient is on Warfarin. Due for INR check but prefers to do it next visit in Coumadin clinic. He will call Coumadin clinic for follow up appointment and INR check.           Meds ordered this encounter  Medications  . predniSONE (DELTASONE) 20 MG tablet    Sig: Take 2 tablets (40 mg total) by mouth daily with breakfast.    Dispense:  10 tablet    Refill:  0     Amayrani Bennick, MD

## 2020-08-07 NOTE — Addendum Note (Signed)
Addended byChevis Pretty on: 08/07/2020 06:46 PM   Modules accepted: Orders

## 2020-08-07 NOTE — Assessment & Plan Note (Addendum)
Patient is in today for left foot pain after hitting his toes on base of the stairs about 5 days ago. He takes tylenol for pain with moderate relief. The pain bothers him at night. He uses cane chronically and able to walk with that although he feels pain when walking on left foot. On exam, he has swelling of left foot. ROM of left ankle is intact but can not move his toes because of the pain. He has tenderness at first metatarsal area and first toe. Perfusion well on distal of left foot.  -Ordering left foot X ray to evaluate for Fx.  -Continue taking Tylenol PRN  ADDENDUM: X ray w/o evidence of Fx but with gout erosion. No prior Hx of gout. Will prescribe 5 day course of prednisone and will reassess his symptoms in 2-3 days. (no evidence of septic arthritis on evaluation today) If no improvement, will consider colchicine (his symptoms started 5 days ago). (Avoiding NSAID given CKD). Will check uric acid when the acute gout attack resolves.  Attempted to call patient. No answer. Left a message about the prescription and will try again tomorrow.

## 2020-08-07 NOTE — Assessment & Plan Note (Signed)
Patient is on Warfarin. Due for INR check but prefers to do it next visit in Coumadin clinic. He will call Coumadin clinic for follow up appointment and INR check.

## 2020-08-07 NOTE — Patient Instructions (Signed)
Thank you for allowing [place] to provide your care today.  You came in due to foot pain after trauma. I order X ray to evaluate for any fracture. I will call you when the result come back.  You can take Tylenol 1 tablet every 6 hours as needed for pain meanwhile. Make sure to follow up at coumadin clinic within next week and in cardiology-heart failure clinic as scheduled for you before. Today we made no changes to your medications.    As always, if having severe symptoms, please seek medical attention at emergency room. Please follow up with your primary care in 2 months.  Should you have any questions or concerns please call the internal medicine clinic at [phone].    Thank you!

## 2020-08-08 ENCOUNTER — Other Ambulatory Visit: Payer: Self-pay | Admitting: Internal Medicine

## 2020-08-08 DIAGNOSIS — I5022 Chronic systolic (congestive) heart failure: Secondary | ICD-10-CM

## 2020-08-09 NOTE — Progress Notes (Signed)
Internal Medicine Clinic Attending  Case discussed with Dr. Masoudi  At the time of the visit.  We reviewed the resident's history and exam and pertinent patient test results.  I agree with the assessment, diagnosis, and plan of care documented in the resident's note.  

## 2020-08-12 ENCOUNTER — Ambulatory Visit: Payer: Medicare Other

## 2020-08-15 ENCOUNTER — Other Ambulatory Visit: Payer: Self-pay | Admitting: Internal Medicine

## 2020-08-15 ENCOUNTER — Telehealth: Payer: Self-pay

## 2020-08-15 DIAGNOSIS — M79672 Pain in left foot: Secondary | ICD-10-CM

## 2020-08-15 DIAGNOSIS — I4892 Unspecified atrial flutter: Secondary | ICD-10-CM

## 2020-08-15 NOTE — Telephone Encounter (Signed)
Agree with plan, thank you!

## 2020-08-15 NOTE — Telephone Encounter (Signed)
Need refill on predniSONE (DELTASONE) 20 MG tablet ;pt contact [phone]   [id number] - [place], [place] - [address]

## 2020-08-15 NOTE — Telephone Encounter (Signed)
Per LOV, pt was to return in 2-3 days for reassessment.  The appt on 2/7 was cancelled. TC to patient, self identified VM obtained and message left for patient to call back and make an appt for reevaluation. SChaplin, RN,BSN

## 2020-08-15 NOTE — Telephone Encounter (Signed)
RTC, patient and friend [name]) on phone asking for refill on prednisone.  RN informed them per Dr. Enis Gash LOV note, pt was to return for reevaluation of left foot pain.  appt offered for today, this was declined.  Requesting an appt for Monday when patient will be at [facility] for Coumadin clinic at 1000.  Appt given for 0845/Dr. Masoudi/left foot swelling/pain reevaluation (red team full). SChaplin, RN,BSN

## 2020-08-16 ENCOUNTER — Other Ambulatory Visit: Payer: Self-pay | Admitting: Internal Medicine

## 2020-08-16 DIAGNOSIS — M10072 Idiopathic gout, left ankle and foot: Secondary | ICD-10-CM

## 2020-08-16 DIAGNOSIS — M10071 Idiopathic gout, right ankle and foot: Secondary | ICD-10-CM

## 2020-08-16 MED ORDER — COLCHICINE 0.6 MG PO TABS: 0.6000 mg | ORAL_TABLET | Freq: Every day | ORAL | 2 refills | Status: DC

## 2020-08-16 NOTE — Telephone Encounter (Signed)
Thank you Stacee and Dr. Laural Benes. I also start him on Colchicine to help with his gout attack pain meanwhile and until he come in person.  I left a VM for patient regarding the prescription.

## 2020-08-16 NOTE — Progress Notes (Signed)
Sending Colchicine 0.6 mg QD for patient for his gout attack. Holding off of more prednisone until be seen. He has an appointment on Monday.  I called patient x 5. No response. Left a VM about above plan, possible side effect of medicine and precausions.  Chevis Pretty, MD [id number] 08/16/2020, 1:47 PM Pager: [phone]

## 2020-08-19 ENCOUNTER — Ambulatory Visit (INDEPENDENT_AMBULATORY_CARE_PROVIDER_SITE_OTHER): Payer: 59 | Admitting: Internal Medicine

## 2020-08-19 ENCOUNTER — Encounter: Payer: Self-pay | Admitting: Internal Medicine

## 2020-08-19 ENCOUNTER — Other Ambulatory Visit: Payer: Self-pay

## 2020-08-19 ENCOUNTER — Ambulatory Visit: Payer: 59

## 2020-08-19 DIAGNOSIS — Z952 Presence of prosthetic heart valve: Secondary | ICD-10-CM | POA: Diagnosis not present

## 2020-08-19 DIAGNOSIS — M10072 Idiopathic gout, left ankle and foot: Secondary | ICD-10-CM

## 2020-08-19 DIAGNOSIS — M79672 Pain in left foot: Secondary | ICD-10-CM

## 2020-08-19 LAB — POCT INR: INR: 4 — AB (ref 2.0–3.0)

## 2020-08-19 MED ORDER — PREDNISONE 10 MG PO TABS: 40.0000 mg | ORAL_TABLET | ORAL | 0 refills | Status: DC

## 2020-08-19 NOTE — Patient Instructions (Addendum)
Thank you for allowing [place] to provide your care today. Today we discussed your new diagnosis of gout.  Please continue taking Colchicine 1 tablet a day.  I send another prescription for prednisone. You need to taper this medicine as follow: Take 4 tablets (40 mg) a day for 3 days. Then 2 tablets (total of 20 mg) for 3 days. Then 1 tablet (10 mg) for 3 days. Follow the diet provided below to prevent gout attack. I recommend you to follow with our nutritionist about that too. We defer that to next time per your request.  Please come back to clinic in 2-3 weeks for follow up of gout and to check your uric acid level (blood work).  Please take rest of your medications as before.    Please come back to clinic in 2-3 weeks or earlier if your symptoms get worse or not improved. As always, if having severe symptoms, please seek medical attention at emergency room. Should you have any questions or concerns please call the internal medicine clinic at [phone].    Thank you!    Gout  Gout is a condition that causes painful swelling of the joints. Gout is a type of inflammation of the joints (arthritis). This condition is caused by having too much uric acid in the body. Uric acid is a chemical that forms when the body breaks down substances called purines. Purines are important for building body proteins. When the body has too much uric acid, sharp crystals can form and build up inside the joints. This causes pain and swelling. Gout attacks can happen quickly and may be very painful (acute gout). Over time, the attacks can affect more joints and become more frequent (chronic gout). Gout can also cause uric acid to build up under the skin and inside the kidneys. What are the causes? This condition is caused by too much uric acid in your blood. This can happen because:  Your kidneys do not remove enough uric acid from your blood. This is the most common cause.  Your body makes too much uric acid. This  can happen with some cancers and cancer treatments. It can also occur if your body is breaking down too many red blood cells (hemolytic anemia).  You eat too many foods that are high in purines. These foods include organ meats and some seafood. Alcohol, especially beer, is also high in purines. A gout attack may be triggered by trauma or stress. What increases the risk? You are more likely to develop this condition if you:  Have a family history of gout.  Are male and middle-aged.  Are male and have gone through menopause.  Are obese.  Frequently drink alcohol, especially beer.  Are dehydrated.  Lose weight too quickly.  Have an organ transplant.  Have lead poisoning.  Take certain medicines, including aspirin, cyclosporine, diuretics, levodopa, and niacin.  Have kidney disease.  Have a skin condition called psoriasis. What are the signs or symptoms? An attack of acute gout happens quickly. It usually occurs in just one joint. The most common place is the big toe. Attacks often start at night. Other joints that may be affected include joints of the feet, ankle, knee, fingers, wrist, or elbow. Symptoms of this condition may include:  Severe pain.  Warmth.  Swelling.  Stiffness.  Tenderness. The affected joint may be very painful to touch.  Shiny, red, or purple skin.  Chills and fever. Chronic gout may cause symptoms more frequently. More joints may be  involved. You may also have white or yellow lumps (tophi) on your hands or feet or in other areas near your joints.   How is this diagnosed? This condition is diagnosed based on your symptoms, medical history, and physical exam. You may have tests, such as:  Blood tests to measure uric acid levels.  Removal of joint fluid with a thin needle (aspiration) to look for uric acid crystals.  X-rays to look for joint damage. How is this treated? Treatment for this condition has two phases: treating an acute attack and  preventing future attacks. Acute gout treatment may include medicines to reduce pain and swelling, including:  NSAIDs.  Steroids. These are strong anti-inflammatory medicines that can be taken by mouth (orally) or injected into a joint.  Colchicine. This medicine relieves pain and swelling when it is taken soon after an attack. It can be given by mouth or through an IV. Preventive treatment may include:  Daily use of smaller doses of NSAIDs or colchicine.  Use of a medicine that reduces uric acid levels in your blood.  Changes to your diet. You may need to see a dietitian about what to eat and drink to prevent gout. Follow these instructions at home: During a gout attack  If directed, put ice on the affected area: ? Put ice in a plastic bag. ? Place a towel between your skin and the bag. ? Leave the ice on for 20 minutes, 2-3 times a day.  Raise (elevate) the affected joint above the level of your heart as often as possible.  Rest the joint as much as possible. If the affected joint is in your leg, you may be given crutches to use.  Follow instructions from your health care provider about eating or drinking restrictions.   Avoiding future gout attacks  Follow a low-purine diet as told by your dietitian or health care provider. Avoid foods and drinks that are high in purines, including liver, kidney, anchovies, asparagus, herring, mushrooms, mussels, and beer.  Maintain a healthy weight or lose weight if you are overweight. If you want to lose weight, talk with your health care provider. It is important that you do not lose weight too quickly.  Start or maintain an exercise program as told by your health care provider. Eating and drinking  Drink enough fluids to keep your urine pale yellow.  If you drink alcohol: ? Limit how much you use to:  0-1 drink a day for women.  0-2 drinks a day for men. ? Be aware of how much alcohol is in your drink. In the [place] one drink equals  one 12 oz bottle of beer (355 mL) one 5 oz glass of wine (148 mL), or one 1 oz glass of hard liquor (44 mL). General instructions  Take over-the-counter and prescription medicines only as told by your health care provider.  Do not drive or use heavy machinery while taking prescription pain medicine.  Return to your normal activities as told by your health care provider. Ask your health care provider what activities are safe for you.  Keep all follow-up visits as told by your health care provider. This is important. Contact a health care provider if you have:  Another gout attack.  Continuing symptoms of a gout attack after 10 days of treatment.  Side effects from your medicines.  Chills or a fever.  Burning pain when you urinate.  Pain in your lower back or belly. Get help right away if you:  Have  severe or uncontrolled pain.  Cannot urinate. Summary  Gout is painful swelling of the joints caused by inflammation.  The most common site of pain is the big toe, but it can affect other joints in the body.  Medicines and dietary changes can help to prevent and treat gout attacks. This information is not intended to replace advice given to you by your health care provider. Make sure you discuss any questions you have with your health care provider. Document Revised: 01/12/2018 Document Reviewed: 01/12/2018 Elsevier Patient Education  2021 [employer].

## 2020-08-19 NOTE — Progress Notes (Signed)
Acute Office Visit  Subjective:    Patient ID: [name], male    DOB: 1946-01-04, 75 y.o.   MRN: [id number]  Chief Complaint  Patient presents with  . Follow-up    HPI Patient is in today for follow up of left foot (toe) pain 2/2 gout. Please refer to problem based charting for further details and assessment and plan of current problem and chronic medical conditions.  PPMHx: HTN, CKD stage 3b, CHF, s/p pacemake, BPH, H/o mitral mechanical valve replacement, recent COVID-19 infection  Medications: ASA, Lasix 20 mg, Warfarin, coreg, Tamsulosin, spironolactone, lisinopril 2.5 mg  Past Medical History:  Diagnosis Date  . Atrial fibrillation (HCC)    post op.  s/p dc-cv 04/2008. previously on amiodarone  . Atrial flutter (HCC)    atypical  . AV block, 1st degree    .450 msec--progressive  . 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  a- Echo 07/2008 Ef 55% mild peroprosthetic MVR with High transmitral gradient (mean 14).  b- normal coronaries by cath 12/2007  . BPH (benign prostatic hyperplasia)   . CHF (congestive heart failure) (HCC)    EF 35-40 % 2011 due to valvular disease and diastolic dysfunction  . Chronic back pain   . CKD (chronic kidney disease), stage III (HCC)   . Dysphagia    with normal barium swallow 09/2008  . Endocarditis   . GERD (gastroesophageal reflux disease)   . Heavy alcohol use    history  . History of GI bleed   . Hypertension   . IV drug abuse (HCC)    history of  . Lower GI bleed 01/2016  . Pacemaker 2010    Past Surgical History:  Procedure Laterality Date  . BIV PACEMAKER GENERATOR CHANGEOUT N/A 12/13/2017   Procedure: BIV PACEMAKER GENERATOR CHANGEOUT;  Surgeon: Duke Salvia, MD;  Location: [facility] INVASIVE CV LAB;  Service: Cardiovascular;  Laterality: N/A;  . CARDIAC VALVE REPLACEMENT     mitral valve, st jude mechanical   . COLONOSCOPY N/A 03/26/2014   Procedure: COLONOSCOPY;  Surgeon: Rachael Fee, MD;  Location: [facility] ENDOSCOPY;  Service: Endoscopy;  Laterality: N/A;  . ENTEROSCOPY N/A 03/28/2014   Procedure: ENTEROSCOPY;  Surgeon: Rachael Fee, MD;  Location: [facility] ENDOSCOPY;  Service: Endoscopy;  Laterality: N/A;  . ESOPHAGOGASTRODUODENOSCOPY N/A 03/25/2014   Procedure: ESOPHAGOGASTRODUODENOSCOPY (EGD);  Surgeon: Iva Boop, MD;  Location: [facility] ENDOSCOPY;  Service: Endoscopy;  Laterality: N/A;  . GIVENS CAPSULE STUDY N/A 03/26/2014   Procedure: GIVENS CAPSULE STUDY;  Surgeon: Rachael Fee, MD;  Location: [facility] ENDOSCOPY;  Service: Endoscopy;  Laterality: N/A;  . PACEMAKER INSERTION     inserted about 4-5 years ago    Family History  Problem Relation Age of Onset  . Coronary artery disease Mother   . Cancer Father        GI  . Cancer Brother        Lung    Social History   Socioeconomic History  . Marital status: Divorced    Spouse name: Not on file  . Number of children: 9  . Years of education: Not on file  . Highest education level: Not on file  Occupational History  . Occupation: retired    Comment: [employer]  Tobacco Use  . Smoking status: Former Smoker    Years: 0.00    Types: Cigarettes  . Smokeless tobacco: Never Used  Vaping Use  .  Vaping Use: Never used  Substance and Sexual Activity  . Alcohol use: No    Alcohol/week: 0.0 standard drinks  . Drug use: No  . Sexual activity: Not Currently  Other Topics Concern  . Not on file  Social History Narrative   Current Social History 02/16/2019        Patient lives with family (baby's mama, daughter and 4 grandkids:  Two yo twins, 72 yo and 63 yo in a home which is 1 story. There are 4 steps with handrails up to the entrance the patient uses.       Patient's method of transportation is via family member (baby's mama).      The highest level of education was high school diploma.      The patient currently retired.      Identified important Relationships are "My Baby's [name], [name]."       Pets  : None       Interests / Fun: Sitting outdoors watching the birds and planes."       Exercise riding a bike 5 miles 3 days/week      Current Stressors: The grandkids       Religious / Personal Beliefs: "I believe in God."       L. Ducatte, RN, BSN       Social Determinants of Health   Financial Resource Strain: Not on file  Food Insecurity: Not on file  Transportation Needs: Not on file  Physical Activity: Not on file  Stress: Not on file  Social Connections: Not on file  Intimate Partner Violence: Not on file    Outpatient Medications Prior to Visit  Medication Sig Dispense Refill  . aspirin EC 81 MG tablet Take 1 tablet (81 mg total) by mouth daily. 90 tablet 3  . carvedilol (COREG) 6.25 MG tablet TAKE 1 TABLET BY MOUTH 2 TIMES DAILY WITH A MEAL. 180 tablet 1  . colchicine 0.6 MG tablet Take 1 tablet (0.6 mg total) by mouth daily. 15 tablet 2  . furosemide (LASIX) 20 MG tablet Take 1 tablet (20 mg total) by mouth daily. 90 tablet 3  . lisinopril (ZESTRIL) 2.5 MG tablet Take 1 tablet (2.5 mg total) by mouth daily. 90 tablet 2  . spironolactone (ALDACTONE) 25 MG tablet Take 1 tablet (25 mg total) by mouth daily. 90 tablet 3  . tamsulosin (FLOMAX) 0.4 MG CAPS capsule TAKE 2 CAPSULES (0.8 MG TOTAL) BY MOUTH DAILY AFTER BREAKFAST. 180 capsule 1  . warfarin (COUMADIN) 5 MG tablet 1 TABLET BY MOUTH DAILY AT 6PM ALL DAYS OF WEEK EXCEPT TUES ONLY TAKE 1/2 TABLET 72 tablet 1  . predniSONE (DELTASONE) 20 MG tablet Take 2 tablets (40 mg total) by mouth daily with breakfast. 10 tablet 0   No facility-administered medications prior to visit.    No Known Allergies  Review of Systems     Objective:    Physical Exam  BP 111/75 (BP Location: Left Arm, Patient Position: Sitting, Cuff Size: Normal)   Pulse 80   Temp 98.5 F (36.9 C) (Oral)   Ht 5\' 8"  (1.727 m)   Wt 186 lb (84.4 kg)   SpO2 100%   BMI 28.28 kg/m  Wt Readings from Last 3 Encounters:  08/19/20 186 lb (84.4 kg)   08/07/20 184 lb 6.4 oz (83.6 kg)  04/10/20 182 lb 6.4 oz (82.7 kg)    Physical Exam Constitutional:      General: He is not in acute distress.  Appearance: Normal appearance. He is not ill-appearing.  Cardiovascular:     Rate and Rhythm: Normal rate and regular rhythm.     Pulses: Normal pulses.     Heart sounds: No murmur heard.   Pulmonary:     Effort: Pulmonary effort is normal.     Breath sounds: No wheezing.     Comments: Trace bibasilar crackles Abdominal:     Palpations: Abdomen is soft.     Tenderness: There is no abdominal tenderness.  Musculoskeletal:        General: Mild swelling and slight tenderness present at left first MTP joint. Skin:    General: Skin is warm.     Findings: No erythema.  Neurological:     Mental Status: He is alert and oriented to person, place, and time. Mental status is at baseline.     Sensory: No sensory deficit.  Psychiatric:        Mood and Affect: Mood normal.        Behavior: Behavior normal.  Health Maintenance Due  Topic Date Due  . COVID-19 Vaccine (3 - Booster for Pfizer series) 04/04/2020    There are no preventive care reminders to display for this patient.   Lab Results  Component Value Date   TSH 1.060 03/26/2016   Lab Results  Component Value Date   WBC 4.8 04/10/2020   HGB 15.2 04/10/2020   HCT 46.4 04/10/2020   MCV 93.7 04/10/2020   PLT 277 04/10/2020   Lab Results  Component Value Date   NA 134 (L) 04/10/2020   K 5.2 (H) 04/10/2020   CO2 21 (L) 04/10/2020   GLUCOSE 92 04/10/2020   BUN 20 04/10/2020   CREATININE 1.57 (H) 04/10/2020   BILITOT 0.9 01/04/2020   ALKPHOS 54 01/04/2020   AST 31 01/04/2020   ALT 17 01/04/2020   PROT 7.4 01/04/2020   ALBUMIN 4.4 01/04/2020   CALCIUM 9.0 04/10/2020   ANIONGAP 11 04/10/2020   GFR 64.22 04/05/2012   Lab Results  Component Value Date   CHOL  12/15/2007    132        ATP III CLASSIFICATION:  <200     mg/dL   Desirable  314-970  mg/dL   Borderline  High  >=263    mg/dL   High   Lab Results  Component Value Date   HDL 24 (L) 12/15/2007   Lab Results  Component Value Date   LDLCALC  12/15/2007    90        Total Cholesterol/HDL:CHD Risk Coronary Heart Disease Risk Table                     Men   Women  1/2 Average Risk   3.4   3.3   Lab Results  Component Value Date   TRIG 91 12/15/2007   Lab Results  Component Value Date   CHOLHDL 5.5 12/15/2007   Lab Results  Component Value Date   HGBA1C  09/30/2007    5.3 (NOTE)   The ADA recommends the following therapeutic goals for glycemic   control related to Hgb A1C measurement:   Goal of Therapy:   < 7.0% Hgb A1C   Action Suggested:  > 8.0% Hgb A1C   Ref:  Diabetes Care, 22, Suppl. 1, 1999       Assessment & Plan:   Problem List Items Addressed This Visit      Other   Acute foot pain, left  Patient is in today for follow up of acute gout attack (recent diagnosis of gout). Initially came in to [facility] 12 days ago due to left foot pain after hitting his toes on base of the stairs about a week ago before visit. X ray of left foot showed no acute fracture but juxta-articular erosions of the first and third metatarsal heads, consistent with gout. He was started with Prednisone with partial improvement. He was then prescribed Colchicine 3 days ago. He reports some improvement.  Tenderness is improved but still has some swelling of first MTP joint. No tophi on exam -Instructed to continue Colchicine until 2 days after resolution of his acute gout symptoms. -Will send a Prednisone taper pack  -F/u in clinic in 2 weeks/after resolution of gout attack to check Uric acid level to start urate lowering agent.        Relevant Medications   predniSONE (DELTASONE) 10 MG tablet   H/O mitral valve replacement with mechanical valve    On Warfarin. Denies any bleeding. Patient has appointment in coumadin clinic today.          Meds ordered this encounter  Medications  . predniSONE  (DELTASONE) 10 MG tablet    Sig: Take 4 tablets (40 mg total) by mouth as directed. Take 4 tablets for 3 days. Then 2 tablets for 3 days. Then 1 tablet for 3 days    Dispense:  21 tablet    Refill:  0     Maze Corniel, MD

## 2020-08-19 NOTE — Progress Notes (Signed)
Anticoagulation Management [name] is a 75 y.o. male who reports to the clinic for monitoring of warfarin treatment.    Indication: atrial fibrillation, h/o mitral valve replacement w/ mechanical valve Duration: indefinite Supervising physician: Debe Coder  Anticoagulation Clinic Visit History: Patient does not report signs/symptoms of bleeding or thromboembolism  Other recent changes: Patient does endorse taking prednisone for gout flare.  Currently tapering off medication and he should be finished with the medication in ~9 days.  He does not report any diet or lifestyle changes. Anticoagulation Episode Summary    Current INR goal:  2.5-3.5  TTR:  63.3 % (7.9 y)  Next INR check:  09/09/2020  INR from last check:  4.0 (08/19/2020)  Weekly max warfarin dose:    Target end date:  Indefinite  INR check location:  Anticoagulation Clinic  Preferred lab:    Send INR reminders to:  [facility]   Indications   IRREGULAR PULSE [I49.9] Long term (current) use of anticoagulants (Resolved) [Z79.01] ATRIAL FLUTTER (Resolved) [I48.92] H/O mitral valve replacement with mechanical valve [Z95.2]       Comments:        Anticoagulation Care Providers    Provider Role Specialty Phone number   [name], MD  Cardiology [phone]      No Known Allergies  Current Outpatient Medications:  .  aspirin EC 81 MG tablet, Take 1 tablet (81 mg total) by mouth daily., Disp: 90 tablet, Rfl: 3 .  carvedilol (COREG) 6.25 MG tablet, TAKE 1 TABLET BY MOUTH 2 TIMES DAILY WITH A MEAL., Disp: 180 tablet, Rfl: 1 .  colchicine 0.6 MG tablet, Take 1 tablet (0.6 mg total) by mouth daily., Disp: 15 tablet, Rfl: 2 .  furosemide (LASIX) 20 MG tablet, Take 1 tablet (20 mg total) by mouth daily., Disp: 90 tablet, Rfl: 3 .  lisinopril (ZESTRIL) 2.5 MG tablet, Take 1 tablet (2.5 mg total) by mouth daily., Disp: 90 tablet, Rfl: 2 .  predniSONE (DELTASONE) 10 MG tablet, Take 4 tablets (40 mg total) by  mouth as directed. Take 4 tablets for 3 days. Then 2 tablets for 3 days. Then 1 tablet for 3 days, Disp: 21 tablet, Rfl: 0 .  spironolactone (ALDACTONE) 25 MG tablet, Take 1 tablet (25 mg total) by mouth daily., Disp: 90 tablet, Rfl: 3 .  tamsulosin (FLOMAX) 0.4 MG CAPS capsule, TAKE 2 CAPSULES (0.8 MG TOTAL) BY MOUTH DAILY AFTER BREAKFAST., Disp: 180 capsule, Rfl: 1 .  warfarin (COUMADIN) 5 MG tablet, 1 TABLET BY MOUTH DAILY AT 6PM ALL DAYS OF WEEK EXCEPT TUES ONLY TAKE 1/2 TABLET, Disp: 72 tablet, Rfl: 1 Past Medical History:  Diagnosis Date  . Atrial fibrillation (HCC)    post op.  s/p dc-cv 04/2008. previously on amiodarone  . Atrial flutter (HCC)    atypical  . AV block, 1st degree    .450 msec--progressive  . 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  a- Echo 07/2008 Ef 55% mild peroprosthetic MVR with High transmitral gradient (mean 14).  b- normal coronaries by cath 12/2007  . BPH (benign prostatic hyperplasia)   . CHF (congestive heart failure) (HCC)    EF 35-40 % 2011 due to valvular disease and diastolic dysfunction  . Chronic back pain   . CKD (chronic kidney disease), stage III (HCC)   . Dysphagia    with normal barium swallow 09/2008  . Endocarditis   . GERD (gastroesophageal reflux disease)   .  Heavy alcohol use    history  . History of GI bleed   . Hypertension   . IV drug abuse (HCC)    history of  . Lower GI bleed 01/2016  . Pacemaker 2010   Social History   Socioeconomic History  . Marital status: Divorced    Spouse name: Not on file  . Number of children: 9  . Years of education: Not on file  . Highest education level: Not on file  Occupational History  . Occupation: retired    Comment: [employer]  Tobacco Use  . Smoking status: Former Smoker    Years: 0.00    Types: Cigarettes  . Smokeless tobacco: Never Used  Vaping Use  . Vaping Use: Never used  Substance and Sexual Activity  . Alcohol use: No     Alcohol/week: 0.0 standard drinks  . Drug use: No  . Sexual activity: Not Currently  Other Topics Concern  . Not on file  Social History Narrative   Current Social History 02/16/2019        Patient lives with family (baby's mama, daughter and 4 grandkids:  Two yo twins, 61 yo and 55 yo in a home which is 1 story. There are 4 steps with handrails up to the entrance the patient uses.       Patient's method of transportation is via family member (baby's mama).      The highest level of education was high school diploma.      The patient currently retired.      Identified important Relationships are "My Baby's [name], [name]."       Pets : None       Interests / Fun: Sitting outdoors watching the birds and planes."       Exercise riding a bike 5 miles 3 days/week      Current Stressors: The grandkids       Religious / Personal Beliefs: "I believe in God."       L. Ducatte, RN, BSN       Social Determinants of Health   Financial Resource Strain: Not on file  Food Insecurity: Not on file  Transportation Needs: Not on file  Physical Activity: Not on file  Stress: Not on file  Social Connections: Not on file   Family History  Problem Relation Age of Onset  . Coronary artery disease Mother   . Cancer Father        GI  . Cancer Brother        Lung    ASSESSMENT Recent Results: The most recent result is correlated with 30 mg per week: Lab Results  Component Value Date   INR 4.0 (A) 08/19/2020   INR 3.0 06/24/2020   INR 2.7 05/20/2020    Anticoagulation Dosing: Description   Take one (1) of your 5mg  peach-colored warfarin tablets by mouth, once-daily at [facility] on Sundays, Tuesdays, Thursdays, and Saturdays. Take 1/2 tablet on Mondays, Wednesdays, and Fridays only.      INR today: Supratherapeutic  PLAN Weekly dose was decreased by 8% to 27.5 mg per week  There are no Patient Instructions on file for this visit. Patient advised to contact clinic or seek medical  attention if signs/symptoms of bleeding or thromboembolism occur.  Patient verbalized understanding by repeating back information and was advised to contact me if further medication-related questions arise. Patient was also provided an information handout.  Follow-up Return in about 3 weeks (around 09/09/2020) for Follow-up INR at  10:00 am.  Rexford Maus, PharmD PGY-1 Acute Care Pharmacy Resident 08/19/2020 10:22 AM  15 minutes spent face-to-face with the patient during the encounter. 50% of time spent on education, including signs/sx bleeding and clotting, as well as food and drug interactions with warfarin. 50% of time was spent on fingerprick POC INR sample collection,processing, results determination, and documentation in [email or web address].

## 2020-08-20 ENCOUNTER — Encounter: Payer: Self-pay | Admitting: Internal Medicine

## 2020-08-20 ENCOUNTER — Ambulatory Visit: Payer: Medicare Other | Admitting: Podiatry

## 2020-08-20 NOTE — Assessment & Plan Note (Signed)
Patient is in today for follow up of acute gout attack (recent diagnosis of gout). Initially came in to [facility] 12 days ago due to left foot pain after hitting his toes on base of the stairs about a week ago before visit. X ray of left foot showed no acute fracture but juxta-articular erosions of the first and third metatarsal heads, consistent with gout. He was started with Prednisone with partial improvement. He was then prescribed Colchicine 3 days ago. He reports some improvement.  Tenderness is improved but still has some swelling of first MTP joint. No tophi on exam -Instructed to continue Colchicine until 2 days after resolution of his acute gout symptoms. -Will send a Prednisone taper pack  -F/u in clinic in 2 weeks/after resolution of gout attack to check Uric acid level to start urate lowering agent.

## 2020-08-20 NOTE — Assessment & Plan Note (Signed)
On Warfarin. Denies any bleeding. Patient has appointment in coumadin clinic today.

## 2020-08-24 ENCOUNTER — Other Ambulatory Visit: Payer: Self-pay | Admitting: Internal Medicine

## 2020-08-24 DIAGNOSIS — M10072 Idiopathic gout, left ankle and foot: Secondary | ICD-10-CM

## 2020-08-27 ENCOUNTER — Ambulatory Visit (INDEPENDENT_AMBULATORY_CARE_PROVIDER_SITE_OTHER): Payer: 59

## 2020-08-27 DIAGNOSIS — I5022 Chronic systolic (congestive) heart failure: Secondary | ICD-10-CM | POA: Diagnosis not present

## 2020-08-27 LAB — CUP PACEART REMOTE DEVICE CHECK
Battery Remaining Longevity: 97 mo
Battery Remaining Percentage: 95.5 %
Battery Voltage: 2.99 V
Date Time Interrogation Session: 20220222020014
Implantable Lead Implant Date: 20120905
Implantable Lead Implant Date: 20120905
Implantable Lead Implant Date: 20120905
Implantable Lead Location: 753858
Implantable Lead Location: 753859
Implantable Lead Location: 753860
Implantable Pulse Generator Implant Date: 20190610
Lead Channel Impedance Value: 430 Ohm
Lead Channel Impedance Value: 710 Ohm
Lead Channel Pacing Threshold Amplitude: 0.875 V
Lead Channel Pacing Threshold Amplitude: 1.5 V
Lead Channel Pacing Threshold Pulse Width: 0.5 ms
Lead Channel Pacing Threshold Pulse Width: 0.6 ms
Lead Channel Sensing Intrinsic Amplitude: 12 mV
Lead Channel Setting Pacing Amplitude: 2 V
Lead Channel Setting Pacing Amplitude: 2.5 V
Lead Channel Setting Pacing Pulse Width: 0.5 ms
Lead Channel Setting Pacing Pulse Width: 0.6 ms
Lead Channel Setting Sensing Sensitivity: 2 mV
Pulse Gen Model: 3222
Pulse Gen Serial Number: 9022287

## 2020-08-28 NOTE — Progress Notes (Signed)
I reviewed the anticoagulation note.  INR elevated and dose was decreased.

## 2020-08-28 NOTE — Progress Notes (Signed)
Internal Medicine Clinic Attending  Case discussed with Dr. Masoudi  At the time of the visit.  We reviewed the resident's history and exam and pertinent patient test results.  I agree with the assessment, diagnosis, and plan of care documented in the resident's note.  

## 2020-09-04 NOTE — Progress Notes (Signed)
Remote pacemaker transmission.   

## 2020-09-09 ENCOUNTER — Other Ambulatory Visit: Payer: Self-pay

## 2020-09-09 ENCOUNTER — Ambulatory Visit: Payer: 59

## 2020-09-09 ENCOUNTER — Other Ambulatory Visit: Payer: Self-pay | Admitting: Pharmacist

## 2020-09-09 DIAGNOSIS — I4892 Unspecified atrial flutter: Secondary | ICD-10-CM

## 2020-09-09 DIAGNOSIS — Z952 Presence of prosthetic heart valve: Secondary | ICD-10-CM

## 2020-09-09 LAB — POCT INR: INR: 2 (ref 2.0–3.0)

## 2020-09-09 MED ORDER — WARFARIN SODIUM 5 MG PO TABS: ORAL_TABLET | ORAL | 1 refills | Status: DC

## 2020-09-09 NOTE — Progress Notes (Signed)
Anticoagulation Management [name] is a 75 y.o. male who reports to the clinic for monitoring of warfarin treatment.    Indication: atrial flutter, h/o mitral valve replacement with mechanical valve  Duration: indefinite Supervising physician: Earl Lagos  Anticoagulation Clinic Visit History: Patient does not report signs/symptoms of bleeding or thromboembolism  Other recent changes: denies diet or lifestyle changes.  Patient reports he may have missed one day's worth of his medications, but is unsure.   Anticoagulation Episode Summary    Current INR goal:  2.5-3.5  TTR:  63.2 % (8 y)  Next INR check:  09/30/2020  INR from last check:  2.0 (09/09/2020)  Weekly max warfarin dose:    Target end date:  Indefinite  INR check location:  Anticoagulation Clinic  Preferred lab:    Send INR reminders to:  [facility]   Indications   IRREGULAR PULSE [I49.9] Long term (current) use of anticoagulants (Resolved) [Z79.01] ATRIAL FLUTTER (Resolved) [I48.92] H/O mitral valve replacement with mechanical valve [Z95.2]       Comments:        Anticoagulation Care Providers    Provider Role Specialty Phone number   [name], MD  Cardiology [phone]      No Known Allergies  Current Outpatient Medications:  .  aspirin EC 81 MG tablet, Take 1 tablet (81 mg total) by mouth daily., Disp: 90 tablet, Rfl: 3 .  carvedilol (COREG) 6.25 MG tablet, TAKE 1 TABLET BY MOUTH 2 TIMES DAILY WITH A MEAL., Disp: 180 tablet, Rfl: 1 .  colchicine 0.6 MG tablet, Take 1 tablet (0.6 mg total) by mouth daily., Disp: 15 tablet, Rfl: 2 .  furosemide (LASIX) 20 MG tablet, Take 1 tablet (20 mg total) by mouth daily., Disp: 90 tablet, Rfl: 3 .  lisinopril (ZESTRIL) 2.5 MG tablet, Take 1 tablet (2.5 mg total) by mouth daily., Disp: 90 tablet, Rfl: 2 .  predniSONE (DELTASONE) 10 MG tablet, Take 4 tablets (40 mg total) by mouth as directed. Take 4 tablets for 3 days. Then 2 tablets for 3 days. Then  1 tablet for 3 days, Disp: 21 tablet, Rfl: 0 .  spironolactone (ALDACTONE) 25 MG tablet, Take 1 tablet (25 mg total) by mouth daily., Disp: 90 tablet, Rfl: 3 .  tamsulosin (FLOMAX) 0.4 MG CAPS capsule, TAKE 2 CAPSULES (0.8 MG TOTAL) BY MOUTH DAILY AFTER BREAKFAST., Disp: 180 capsule, Rfl: 1 .  warfarin (COUMADIN) 5 MG tablet, 1 TABLET BY MOUTH DAILY AT 6PM ALL DAYS OF WEEK EXCEPT TUES ONLY TAKE 1/2 TABLET, Disp: 72 tablet, Rfl: 1 Past Medical History:  Diagnosis Date  . Atrial fibrillation (HCC)    post op.  s/p dc-cv 04/2008. previously on amiodarone  . Atrial flutter (HCC)    atypical  . AV block, 1st degree    .450 msec--progressive  . 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  a- Echo 07/2008 Ef 55% mild peroprosthetic MVR with High transmitral gradient (mean 14).  b- normal coronaries by cath 12/2007  . BPH (benign prostatic hyperplasia)   . CHF (congestive heart failure) (HCC)    EF 35-40 % 2011 due to valvular disease and diastolic dysfunction  . Chronic back pain   . CKD (chronic kidney disease), stage III (HCC)   . Dysphagia    with normal barium swallow 09/2008  . Endocarditis   . GERD (gastroesophageal reflux disease)   . Heavy alcohol use    history  .  History of GI bleed   . Hypertension   . IV drug abuse (HCC)    history of  . Lower GI bleed 01/2016  . Pacemaker 2010   Social History   Socioeconomic History  . Marital status: Divorced    Spouse name: Not on file  . Number of children: 9  . Years of education: Not on file  . Highest education level: Not on file  Occupational History  . Occupation: retired    Comment: [employer]  Tobacco Use  . Smoking status: Former Smoker    Years: 0.00    Types: Cigarettes  . Smokeless tobacco: Never Used  Vaping Use  . Vaping Use: Never used  Substance and Sexual Activity  . Alcohol use: No    Alcohol/week: 0.0 standard drinks  . Drug use: No  . Sexual activity: Not Currently   Other Topics Concern  . Not on file  Social History Narrative   Current Social History 02/16/2019        Patient lives with family (baby's mama, daughter and 4 grandkids:  Two yo twins, 58 yo and 35 yo in a home which is 1 story. There are 4 steps with handrails up to the entrance the patient uses.       Patient's method of transportation is via family member (baby's mama).      The highest level of education was high school diploma.      The patient currently retired.      Identified important Relationships are "My Baby's [name], [name]."       Pets : None       Interests / Fun: Sitting outdoors watching the birds and planes."       Exercise riding a bike 5 miles 3 days/week      Current Stressors: The grandkids       Religious / Personal Beliefs: "I believe in God."       L. Ducatte, RN, BSN       Social Determinants of Health   Financial Resource Strain: Not on file  Food Insecurity: Not on file  Transportation Needs: Not on file  Physical Activity: Not on file  Stress: Not on file  Social Connections: Not on file   Family History  Problem Relation Age of Onset  . Coronary artery disease Mother   . Cancer Father        GI  . Cancer Brother        Lung    ASSESSMENT Recent Results: The most recent result is correlated with 27.5 mg per week: Lab Results  Component Value Date   INR 2.0 09/09/2020   INR 4.0 (A) 08/19/2020   INR 3.0 06/24/2020    Anticoagulation Dosing: Description   Take one (1) of your 5mg  peach-colored warfarin tablets by mouth, once-daily at [facility] on Sundays, Mondays, Wednesdays, and Fridays. Take 1/2 tablet on Tuesdays and Thursdays only.      INR today: Subtherapeutic  PLAN Weekly dose was increased by 9% to 30 mg per week.  New instructions: take one of your 5mg  peach colored warfarin tablets on Sundays, Mondays, Wednesdays, Fridays, and Saturdays.  Take one-half of your 5mg  peach colored warfarin tablets on Tuesdays and Thursdays  only.  Patient was also instructed to take an additional half tablet today for a total of one and one-half tablets today ONLY (total 7.5mg  today).    There are no Patient Instructions on file for this visit. Patient advised to contact  clinic or seek medical attention if signs/symptoms of bleeding or thromboembolism occur.  Patient verbalized understanding by repeating back information and was advised to contact me if further medication-related questions arise. Patient was also provided an information handout.  Follow-up Return in about 3 weeks (around 09/30/2020) for Follow up INR at 9:45am.  Rexford Maus, PharmD PGY-1 Acute Care Pharmacy Resident 09/09/2020 10:26 AM  15 minutes spent face-to-face with the patient during the encounter. 50% of time spent on education, including signs/sx bleeding and clotting, as well as food and drug interactions with warfarin. 50% of time was spent on fingerprick POC INR sample collection,processing, results determination, and documentation in [email or web address].

## 2020-09-11 NOTE — Progress Notes (Signed)
INTERNAL MEDICINE TEACHING ATTENDING ADDENDUM - Hazem Kenner M.D  Duration- indefinite, Indication- mechanical MVR, INR- sub-therapeutic. Agree with pharmacy recommendations as outlined in their note.

## 2020-09-20 ENCOUNTER — Other Ambulatory Visit: Payer: Self-pay | Admitting: Internal Medicine

## 2020-09-20 DIAGNOSIS — M10072 Idiopathic gout, left ankle and foot: Secondary | ICD-10-CM

## 2020-09-22 ENCOUNTER — Other Ambulatory Visit: Payer: Self-pay | Admitting: Student

## 2020-09-30 ENCOUNTER — Ambulatory Visit: Payer: 59

## 2020-10-03 ENCOUNTER — Encounter: Payer: Self-pay | Admitting: *Deleted

## 2020-10-03 NOTE — Progress Notes (Signed)

## 2020-10-07 ENCOUNTER — Ambulatory Visit (INDEPENDENT_AMBULATORY_CARE_PROVIDER_SITE_OTHER): Payer: 59 | Admitting: Pharmacist

## 2020-10-07 DIAGNOSIS — Z952 Presence of prosthetic heart valve: Secondary | ICD-10-CM

## 2020-10-07 DIAGNOSIS — I4892 Unspecified atrial flutter: Secondary | ICD-10-CM

## 2020-10-07 LAB — POCT INR: INR: 2.4 (ref 2.0–3.0)

## 2020-10-07 MED ORDER — WARFARIN SODIUM 5 MG PO TABS: ORAL_TABLET | ORAL | 1 refills | Status: DC

## 2020-10-07 NOTE — Progress Notes (Signed)
Anticoagulation Management [name] is a 75 y.o. male who reports to the clinic for monitoring of warfarin treatment.    Indication: history of mitral valve replacement Duration: indefinite Supervising physician: Charissa Bash, MD  Anticoagulation Clinic Visit History: Patient does not report signs/symptoms of bleeding or thromboembolism  Other recent changes: No changes to diet, medications, lifestyle Anticoagulation Episode Summary    Current INR goal:  2.5-3.5  TTR:  62.6 % (8.1 y)  Next INR check:  10/28/2020  INR from last check:  2.4 (10/07/2020)  Weekly max warfarin dose:    Target end date:  Indefinite  INR check location:  Anticoagulation Clinic  Preferred lab:    Send INR reminders to:  [facility]   Indications   IRREGULAR PULSE [I49.9] Long term (current) use of anticoagulants (Resolved) [Z79.01] ATRIAL FLUTTER (Resolved) [I48.92] H/O mitral valve replacement with mechanical valve [Z95.2]       Comments:        Anticoagulation Care Providers    Provider Role Specialty Phone number   [name], MD  Cardiology [phone]      No Known Allergies  Current Outpatient Medications:  .  aspirin EC 81 MG tablet, Take 1 tablet (81 mg total) by mouth daily., Disp: 90 tablet, Rfl: 3 .  carvedilol (COREG) 6.25 MG tablet, TAKE 1 TABLET BY MOUTH 2 TIMES DAILY WITH A MEAL., Disp: 180 tablet, Rfl: 1 .  colchicine 0.6 MG tablet, Take 1 tablet (0.6 mg total) by mouth daily., Disp: 15 tablet, Rfl: 2 .  furosemide (LASIX) 20 MG tablet, Take 1 tablet (20 mg total) by mouth daily., Disp: 90 tablet, Rfl: 3 .  lisinopril (ZESTRIL) 2.5 MG tablet, Take 1 tablet (2.5 mg total) by mouth daily. Needs appt for further refills, Disp: 90 tablet, Rfl: 0 .  predniSONE (DELTASONE) 10 MG tablet, Take 4 tablets (40 mg total) by mouth as directed. Take 4 tablets for 3 days. Then 2 tablets for 3 days. Then 1 tablet for 3 days, Disp: 21 tablet, Rfl: 0 .  spironolactone (ALDACTONE)  25 MG tablet, Take 1 tablet (25 mg total) by mouth daily., Disp: 90 tablet, Rfl: 3 .  tamsulosin (FLOMAX) 0.4 MG CAPS capsule, TAKE 2 CAPSULES (0.8 MG TOTAL) BY MOUTH DAILY AFTER BREAKFAST., Disp: 180 capsule, Rfl: 1 .  warfarin (COUMADIN) 5 MG tablet, Take one tablet by mouth, once-daily--EXCEPT on Tuesdays and Thursdays, take ONLY 1/2 tablet on these days., Disp: 72 tablet, Rfl: 1 Past Medical History:  Diagnosis Date  . Atrial fibrillation (HCC)    post op.  s/p dc-cv 04/2008. previously on amiodarone  . Atrial flutter (HCC)    atypical  . AV block, 1st degree    .450 msec--progressive  . 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  a- Echo 07/2008 Ef 55% mild peroprosthetic MVR with High transmitral gradient (mean 14).  b- normal coronaries by cath 12/2007  . BPH (benign prostatic hyperplasia)   . CHF (congestive heart failure) (HCC)    EF 35-40 % 2011 due to valvular disease and diastolic dysfunction  . Chronic back pain   . CKD (chronic kidney disease), stage III (HCC)   . Dysphagia    with normal barium swallow 09/2008  . Endocarditis   . GERD (gastroesophageal reflux disease)   . Heavy alcohol use    history  . History of GI bleed   . Hypertension   . IV drug abuse (HCC)  history of  . Lower GI bleed 01/2016  . Pacemaker 2010   Social History   Socioeconomic History  . Marital status: Divorced    Spouse name: Not on file  . Number of children: 9  . Years of education: Not on file  . Highest education level: Not on file  Occupational History  . Occupation: retired    Comment: [employer]  Tobacco Use  . Smoking status: Former Smoker    Years: 0.00    Types: Cigarettes  . Smokeless tobacco: Never Used  Vaping Use  . Vaping Use: Never used  Substance and Sexual Activity  . Alcohol use: No    Alcohol/week: 0.0 standard drinks  . Drug use: No  . Sexual activity: Not Currently  Other Topics Concern  . Not on file  Social  History Narrative   Current Social History 02/16/2019        Patient lives with family (baby's mama, daughter and 4 grandkids:  Two yo twins, 60 yo and 22 yo in a home which is 1 story. There are 4 steps with handrails up to the entrance the patient uses.       Patient's method of transportation is via family member (baby's mama).      The highest level of education was high school diploma.      The patient currently retired.      Identified important Relationships are "My Baby's [name], [name]."       Pets : None       Interests / Fun: Sitting outdoors watching the birds and planes."       Exercise riding a bike 5 miles 3 days/week      Current Stressors: The grandkids       Religious / Personal Beliefs: "I believe in God."       L. Ducatte, RN, BSN       Social Determinants of Health   Financial Resource Strain: Not on file  Food Insecurity: Not on file  Transportation Needs: Not on file  Physical Activity: Not on file  Stress: Not on file  Social Connections: Not on file   Family History  Problem Relation Age of Onset  . Coronary artery disease Mother   . Cancer Father        GI  . Cancer Brother        Lung    ASSESSMENT Recent Results: The most recent result is correlated with 30 mg per week: Lab Results  Component Value Date   INR 2.4 10/07/2020   INR 2.0 09/09/2020   INR 4.0 (A) 08/19/2020    Anticoagulation Dosing: Description   Take one (1) of your 5mg  peach-colored warfarin tablets by mouth, once-daily at [facility] on Sundays, Mondays, Tuesdays, Wednesdays, Fridays, and Saturdays. Take 1/2 tablet on Thursdays only.      INR today: Subtherapeutic  PLAN Weekly dose was increased by 8% to 32.5 mg per week  Patient Instructions  Patient instructed to take medications as defined in the Anti-coagulation Track section of this encounter.  Patient instructed to take today's dose.  Patient instructed to take one (1) of your 5mg  peach-colored warfarin  tablets by mouth, once-daily at [facility] on Sundays, Mondays, Tuesdays, Wednesdays, Fridays, and Saturdays. Take 1/2 tablet on Thursdays only.  Patient verbalized understanding of these instructions.     Patient advised to contact clinic or seek medical attention if signs/symptoms of bleeding or thromboembolism occur.  Patient verbalized understanding by repeating back information and  was advised to contact me if further medication-related questions arise. Patient was also provided an information handout.  Follow-up Return in about 3 weeks (around 10/28/2020) for Follw up INR @ 10:15 AM.  Calton Dach, PharmD PGY1 Pharmacy Resident 10/07/2020 10:31 AM   15 minutes spent face-to-face with the patient during the encounter. 50% of time spent on education, including signs/sx bleeding and clotting, as well as food and drug interactions with warfarin. 50% of time was spent on fingerprick POC INR sample collection,processing, results determination, and documentation in [email or web address].

## 2020-10-07 NOTE — Addendum Note (Signed)
Addended by: Hulen Luster B on: 10/07/2020 02:26 PM   Modules accepted: Orders

## 2020-10-07 NOTE — Progress Notes (Signed)
Things That May Be Affecting Your Health:  Alcohol X Hearing loss  Pain    Depression  Home Safety  Sexual Health   Diabetes  Lack of physical activity  Stress   Difficulty with daily activities X Loneliness  Tiredness   Drug use X Medicines  Tobacco use   Falls  Motor Vehicle Safety  Weight   Food choices  Oral Health  Other    YOUR PERSONALIZED HEALTH PLAN : 1. Schedule your next subsequent Medicare Wellness visit in one year 2. Attend all of your regular appointments to address your medical issues 3. Complete the preventative screenings and services   Annual Wellness Visit   Medicare Covered Preventative Screenings and Services  Services & Screenings Men and Women Who How Often Need? Date of Last Service Action  Abdominal Aortic Aneurysm Adults with AAA risk factors Once      Alcohol Misuse and Counseling All Adults Screening once a year if no alcohol misuse. Counseling up to 4 face to face sessions.     Bone Density Measurement  Adults at risk for osteoporosis Once every 2 yrs X     Lipid Panel Z13.6 All adults without CV disease Once every 5 yrs       Colorectal Cancer   Stool sample or  Colonoscopy All adults 50 and older   Once every year  Every 10 years        Depression All Adults Once a year  Today   Diabetes Screening Blood glucose, post glucose load, or GTT Z13.1  All adults at risk  Pre-diabetics  Once per year  Twice per year      Diabetes  Self-Management Training All adults Diabetics 10 hrs first year; 2 hours subsequent years. Requires Copay     Glaucoma  Diabetics  Family history of glaucoma  African Americans 50 yrs +  Hispanic Americans 65 yrs + Annually - requires coppay X     Hepatitis C Z72.89 or F19.20  High Risk for HCV  Born between 1945 and 1965  Annually  Once      HIV Z11.4 All adults based on risk  Annually btw ages 7 & 15 regardless of risk  Annually > 65 yrs if at increased risk      Lung Cancer Screening  Asymptomatic adults aged 34-77 with 30 pack yr history and current smoker OR quit within the last 15 yrs Annually Must have counseling and shared decision making documentation before first screen      Medical Nutrition Therapy Adults with   Diabetes  Renal disease  Kidney transplant within past 3 yrs 3 hours first year; 2 hours subsequent years     Obesity and Counseling All adults Screening once a year Counseling if BMI 30 or higher  Today   Tobacco Use Counseling Adults who use tobacco  Up to 8 visits in one year     Vaccines Z23  Hepatitis B  Influenza   Pneumonia  Adults   Once  Once every flu season  Two different vaccines separated by one year     Next Annual Wellness Visit People with Medicare Every year  Today     Services & Screenings Women Who How Often Need  Date of Last Service Action  Mammogram  Z12.31 Women over 40 One baseline ages 75-39. Annually ager 40 yrs+      Pap tests All women Annually if high risk. Every 2 yrs for normal risk women  Screening for cervical cancer with   Pap (Z01.419 nl or Z01.411abnl) &  HPV Z11.51 Women aged 53 to 62 Once every 5 yrs     Screening pelvic and breast exams All women Annually if high risk. Every 2 yrs for normal risk women     Sexually Transmitted Diseases  Chlamydia  Gonorrhea  Syphilis All at risk adults Annually for non pregnant females at increased risk         Services & Screenings Men Who How Ofter Need  Date of Last Service Action  Prostate Cancer - DRE & PSA Men over 50 Annually.  DRE might require a copay. X       Sexually Transmitted Diseases  Syphilis All at risk adults Annually for men at increased risk      Health Maintenance List Health Maintenance  Topic Date Due  . COVID-19 Vaccine (3 - Booster for Pfizer series) 04/04/2020  . INFLUENZA VACCINE  02/03/2021  . TETANUS/TDAP  11/12/2025  . Hepatitis C Screening  Completed  . PNA vac Low Risk Adult  Completed  . HPV  VACCINES  Aged Out

## 2020-10-07 NOTE — Patient Instructions (Signed)
Patient instructed to take medications as defined in the Anti-coagulation Track section of this encounter.  Patient instructed to take today's dose.  Patient instructed to take one (1) of your 5mg  peach-colored warfarin tablets by mouth, once-daily at [facility] on Sundays, Mondays, Tuesdays, Wednesdays, Fridays, and Saturdays. Take 1/2 tablet on Thursdays only.  Patient verbalized understanding of these instructions.

## 2020-10-07 NOTE — Progress Notes (Signed)
Chart reviewed; I agree with Dr. Mitchell's documentation. 

## 2020-10-09 ENCOUNTER — Other Ambulatory Visit: Payer: Self-pay | Admitting: Internal Medicine

## 2020-10-09 DIAGNOSIS — M10072 Idiopathic gout, left ankle and foot: Secondary | ICD-10-CM

## 2020-10-28 ENCOUNTER — Ambulatory Visit: Payer: 59

## 2020-11-04 ENCOUNTER — Ambulatory Visit (INDEPENDENT_AMBULATORY_CARE_PROVIDER_SITE_OTHER): Payer: 59 | Admitting: Pharmacist

## 2020-11-04 DIAGNOSIS — Z952 Presence of prosthetic heart valve: Secondary | ICD-10-CM | POA: Diagnosis not present

## 2020-11-04 DIAGNOSIS — I4892 Unspecified atrial flutter: Secondary | ICD-10-CM | POA: Diagnosis not present

## 2020-11-04 DIAGNOSIS — Z7901 Long term (current) use of anticoagulants: Secondary | ICD-10-CM | POA: Diagnosis not present

## 2020-11-04 LAB — POCT INR: INR: 2 (ref 2.0–3.0)

## 2020-11-04 NOTE — Patient Instructions (Signed)
Patient instructed to take medications as defined in the Anti-coagulation Track section of this encounter.  Patient instructed to take today's dose.  Patient instructed to take one (1) of your 5mg  peach-colored warfarin tablets by mouth, once-daily at [facility] daily. Patient verbalized understanding of these instructions.

## 2020-11-04 NOTE — Progress Notes (Signed)
Anticoagulation Management [name] is a 75 y.o. male who reports to the clinic for monitoring of warfarin treatment.    Indication: History of mitral valve replacement; History of atrial flutter; long term current use of warfarin oral anticoagulant.    Duration: indefinite Supervising physician: Earl Lagos  Anticoagulation Clinic Visit History: Patient does not report signs/symptoms of bleeding or thromboembolism  Other recent changes: No diet, medications, lifestyle changes, except as noted in patient findings.  Anticoagulation Episode Summary    Current INR goal:  2.5-3.5  TTR:  62.0 % (8.1 y)  Next INR check:  12/09/2020  INR from last check:  2.0 (11/04/2020)  Weekly max warfarin dose:    Target end date:  Indefinite  INR check location:  Anticoagulation Clinic  Preferred lab:    Send INR reminders to:  ANTICOAG [facility]   Indications   IRREGULAR PULSE [I49.9] Long term (current) use of anticoagulants (Resolved) [Z79.01] ATRIAL FLUTTER (Resolved) [I48.92] H/O mitral valve replacement with mechanical valve [Z95.2]       Comments:        Anticoagulation Care Providers    Provider Role Specialty Phone number   [name], MD  Cardiology [phone]      No Known Allergies  Current Outpatient Medications:  .  aspirin EC 81 MG tablet, Take 1 tablet (81 mg total) by mouth daily., Disp: 90 tablet, Rfl: 3 .  carvedilol (COREG) 6.25 MG tablet, TAKE 1 TABLET BY MOUTH 2 TIMES DAILY WITH A MEAL., Disp: 180 tablet, Rfl: 1 .  colchicine 0.6 MG tablet, TAKE 1 TABLET BY MOUTH EVERY DAY, Disp: 15 tablet, Rfl: 2 .  furosemide (LASIX) 20 MG tablet, Take 1 tablet (20 mg total) by mouth daily., Disp: 90 tablet, Rfl: 3 .  lisinopril (ZESTRIL) 2.5 MG tablet, Take 1 tablet (2.5 mg total) by mouth daily. Needs appt for further refills, Disp: 90 tablet, Rfl: 0 .  predniSONE (DELTASONE) 10 MG tablet, Take 4 tablets (40 mg total) by mouth as directed. Take 4 tablets for 3 days.  Then 2 tablets for 3 days. Then 1 tablet for 3 days, Disp: 21 tablet, Rfl: 0 .  spironolactone (ALDACTONE) 25 MG tablet, Take 1 tablet (25 mg total) by mouth daily., Disp: 90 tablet, Rfl: 3 .  tamsulosin (FLOMAX) 0.4 MG CAPS capsule, TAKE 2 CAPSULES (0.8 MG TOTAL) BY MOUTH DAILY AFTER BREAKFAST., Disp: 180 capsule, Rfl: 1 .  warfarin (COUMADIN) 5 MG tablet, Take one tablet by mouth, once-daily--EXCEPT on Thursdays, take ONLY 1/2 tablet on Thursdays., Disp: 78 tablet, Rfl: 1 Past Medical History:  Diagnosis Date  . Atrial fibrillation (HCC)    post op.  s/p dc-cv 04/2008. previously on amiodarone  . Atrial flutter (HCC)    atypical  . AV block, 1st degree    .450 msec--progressive  . 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  a- Echo 07/2008 Ef 55% mild peroprosthetic MVR with High transmitral gradient (mean 14).  b- normal coronaries by cath 12/2007  . BPH (benign prostatic hyperplasia)   . CHF (congestive heart failure) (HCC)    EF 35-40 % 2011 due to valvular disease and diastolic dysfunction  . Chronic back pain   . CKD (chronic kidney disease), stage III (HCC)   . Dysphagia    with normal barium swallow 09/2008  . Endocarditis   . GERD (gastroesophageal reflux disease)   . Heavy alcohol use    history  . History  of GI bleed   . Hypertension   . IV drug abuse (HCC)    history of  . Lower GI bleed 01/2016  . Pacemaker 2010   Social History   Socioeconomic History  . Marital status: Divorced    Spouse name: Not on file  . Number of children: 9  . Years of education: Not on file  . Highest education level: Not on file  Occupational History  . Occupation: retired    Comment: [employer]  Tobacco Use  . Smoking status: Former Smoker    Years: 0.00    Types: Cigarettes  . Smokeless tobacco: Never Used  Vaping Use  . Vaping Use: Never used  Substance and Sexual Activity  . Alcohol use: No    Alcohol/week: 0.0 standard drinks  . Drug  use: No  . Sexual activity: Not Currently  Other Topics Concern  . Not on file  Social History Narrative   Current Social History 02/16/2019        Patient lives with family (baby's mama, daughter and 4 grandkids:  Two yo twins, 65 yo and 66 yo in a home which is 1 story. There are 4 steps with handrails up to the entrance the patient uses.       Patient's method of transportation is via family member (baby's mama).      The highest level of education was high school diploma.      The patient currently retired.      Identified important Relationships are "My Baby's [name], [name]."       Pets : None       Interests / Fun: Sitting outdoors watching the birds and planes."       Exercise riding a bike 5 miles 3 days/week      Current Stressors: The grandkids       Religious / Personal Beliefs: "I believe in God."       L. Ducatte, RN, BSN       Social Determinants of Health   Financial Resource Strain: Not on file  Food Insecurity: Not on file  Transportation Needs: Not on file  Physical Activity: Not on file  Stress: Not on file  Social Connections: Not on file   Family History  Problem Relation Age of Onset  . Coronary artery disease Mother   . Cancer Father        GI  . Cancer Brother        Lung    ASSESSMENT Recent Results: The most recent result is correlated with 20 mg per week accounting for the three consecutive days of missed doses last week.  Lab Results  Component Value Date   INR 2.0 11/04/2020   INR 2.4 10/07/2020   INR 2.0 09/09/2020    Anticoagulation Dosing: Description   Take one (1) of your 5mg  peach-colored warfarin tablets by mouth, once-daily at [facility] daily.      INR today: Subtherapeutic  PLAN Weekly dose was increased to one of your five (5)mg warfarin tablets by mouth daily.   Patient Instructions  Patient instructed to take medications as defined in the Anti-coagulation Track section of this encounter.  Patient instructed to  take today's dose.  Patient instructed to take one (1) of your 5mg  peach-colored warfarin tablets by mouth, once-daily at [facility] daily. Patient verbalized understanding of these instructions.    Patient advised to contact clinic or seek medical attention if signs/symptoms of bleeding or thromboembolism occur.  Patient verbalized understanding  by repeating back information and was advised to contact me if further medication-related questions arise. Patient was also provided an information handout.  Follow-up Return in 5 weeks (on 12/09/2020) for Follow up INR.  Elicia Lamp, PharmD, CPP  15 minutes spent face-to-face with the patient during the encounter. 50% of time spent on education, including signs/sx bleeding and clotting, as well as food and drug interactions with warfarin. 50% of time was spent on fingerprick POC INR sample collection,processing, results determination, and documentation in [email or web address].

## 2020-11-06 ENCOUNTER — Other Ambulatory Visit: Payer: Self-pay | Admitting: Internal Medicine

## 2020-11-06 DIAGNOSIS — M10072 Idiopathic gout, left ankle and foot: Secondary | ICD-10-CM

## 2020-11-07 NOTE — Progress Notes (Signed)
INTERNAL MEDICINE TEACHING ATTENDING ADDENDUM - Marianita Botkin M.D  Duration- indefinite, Indication- mechanical MVR, INR- sub therapeutic. Agree with pharmacy recommendations as outlined in their note.

## 2020-11-24 ENCOUNTER — Other Ambulatory Visit: Payer: Self-pay | Admitting: Student

## 2020-11-24 DIAGNOSIS — M10072 Idiopathic gout, left ankle and foot: Secondary | ICD-10-CM

## 2020-11-26 ENCOUNTER — Ambulatory Visit (INDEPENDENT_AMBULATORY_CARE_PROVIDER_SITE_OTHER): Payer: 59

## 2020-11-26 DIAGNOSIS — I5022 Chronic systolic (congestive) heart failure: Secondary | ICD-10-CM

## 2020-11-26 LAB — CUP PACEART REMOTE DEVICE CHECK
Battery Remaining Longevity: 91 mo
Battery Remaining Percentage: 95.5 %
Battery Voltage: 2.99 V
Date Time Interrogation Session: 20220524020012
Implantable Lead Implant Date: 20120905
Implantable Lead Implant Date: 20120905
Implantable Lead Implant Date: 20120905
Implantable Lead Location: 753858
Implantable Lead Location: 753859
Implantable Lead Location: 753860
Implantable Pulse Generator Implant Date: 20190610
Lead Channel Impedance Value: 390 Ohm
Lead Channel Impedance Value: 660 Ohm
Lead Channel Pacing Threshold Amplitude: 1.375 V
Lead Channel Pacing Threshold Amplitude: 1.5 V
Lead Channel Pacing Threshold Pulse Width: 0.5 ms
Lead Channel Pacing Threshold Pulse Width: 0.6 ms
Lead Channel Sensing Intrinsic Amplitude: 12 mV
Lead Channel Setting Pacing Amplitude: 2.375
Lead Channel Setting Pacing Amplitude: 2.5 V
Lead Channel Setting Pacing Pulse Width: 0.5 ms
Lead Channel Setting Pacing Pulse Width: 0.6 ms
Lead Channel Setting Sensing Sensitivity: 2 mV
Pulse Gen Model: 3222
Pulse Gen Serial Number: 9022287

## 2020-12-09 ENCOUNTER — Encounter: Payer: Self-pay | Admitting: Student

## 2020-12-09 ENCOUNTER — Ambulatory Visit (INDEPENDENT_AMBULATORY_CARE_PROVIDER_SITE_OTHER): Payer: 59 | Admitting: Internal Medicine

## 2020-12-09 ENCOUNTER — Ambulatory Visit (INDEPENDENT_AMBULATORY_CARE_PROVIDER_SITE_OTHER): Payer: 59 | Admitting: Pharmacist

## 2020-12-09 ENCOUNTER — Other Ambulatory Visit: Payer: Self-pay

## 2020-12-09 DIAGNOSIS — I10 Essential (primary) hypertension: Secondary | ICD-10-CM | POA: Diagnosis not present

## 2020-12-09 DIAGNOSIS — Z7901 Long term (current) use of anticoagulants: Secondary | ICD-10-CM | POA: Diagnosis not present

## 2020-12-09 DIAGNOSIS — Z0000 Encounter for general adult medical examination without abnormal findings: Secondary | ICD-10-CM | POA: Diagnosis not present

## 2020-12-09 DIAGNOSIS — Z5181 Encounter for therapeutic drug level monitoring: Secondary | ICD-10-CM

## 2020-12-09 DIAGNOSIS — I4892 Unspecified atrial flutter: Secondary | ICD-10-CM | POA: Diagnosis not present

## 2020-12-09 DIAGNOSIS — Z952 Presence of prosthetic heart valve: Secondary | ICD-10-CM | POA: Diagnosis not present

## 2020-12-09 LAB — POCT INR: INR: 2.3 (ref 2.0–3.0)

## 2020-12-09 NOTE — Progress Notes (Signed)
Anticoagulation Management [name] is a 75 y.o. male who reports to the clinic for monitoring of warfarin treatment.    Indication: History of mitral valve replacement; history of atrial flutter; long term current use of warfarin to target INR 2.5 - 3.5.    Duration: indefinite Supervising physician: Charissa Bash, MD  Anticoagulation Clinic Visit History: Patient does not report signs/symptoms of bleeding or thromboembolism  Other recent changes: No diet, medications, lifestyle changes endorsed by the patient at this visit.  Anticoagulation Episode Summary    Current INR goal:  2.5-3.5  TTR:  61.3 % (8.2 y)  Next INR check:  01/13/2021  INR from last check:  2.3 (12/09/2020)  Weekly max warfarin dose:    Target end date:  Indefinite  INR check location:  Anticoagulation Clinic  Preferred lab:    Send INR reminders to:  ANTICOAG [facility]   Indications   IRREGULAR PULSE [I49.9] Long term (current) use of anticoagulants (Resolved) [Z79.01] ATRIAL FLUTTER (Resolved) [I48.92] H/O mitral valve replacement with mechanical valve [Z95.2]       Comments:        Anticoagulation Care Providers    Provider Role Specialty Phone number   [name], MD  Cardiology [phone]      No Known Allergies  Current Outpatient Medications:  .  aspirin EC 81 MG tablet, Take 1 tablet (81 mg total) by mouth daily., Disp: 90 tablet, Rfl: 3 .  carvedilol (COREG) 6.25 MG tablet, TAKE 1 TABLET BY MOUTH 2 TIMES DAILY WITH A MEAL., Disp: 180 tablet, Rfl: 1 .  colchicine 0.6 MG tablet, TAKE 1 TABLET BY MOUTH EVERY DAY, Disp: 90 tablet, Rfl: 0 .  furosemide (LASIX) 20 MG tablet, Take 1 tablet (20 mg total) by mouth daily., Disp: 90 tablet, Rfl: 3 .  lisinopril (ZESTRIL) 2.5 MG tablet, Take 1 tablet (2.5 mg total) by mouth daily. Needs appt for further refills, Disp: 90 tablet, Rfl: 0 .  predniSONE (DELTASONE) 10 MG tablet, Take 4 tablets (40 mg total) by mouth as directed. Take 4 tablets for  3 days. Then 2 tablets for 3 days. Then 1 tablet for 3 days, Disp: 21 tablet, Rfl: 0 .  spironolactone (ALDACTONE) 25 MG tablet, Take 1 tablet (25 mg total) by mouth daily., Disp: 90 tablet, Rfl: 3 .  tamsulosin (FLOMAX) 0.4 MG CAPS capsule, TAKE 2 CAPSULES (0.8 MG TOTAL) BY MOUTH DAILY AFTER BREAKFAST., Disp: 180 capsule, Rfl: 1 .  warfarin (COUMADIN) 5 MG tablet, Take one tablet by mouth, once-daily--EXCEPT on Thursdays, take ONLY 1/2 tablet on Thursdays., Disp: 78 tablet, Rfl: 1 Past Medical History:  Diagnosis Date  . Atrial fibrillation (HCC)    post op.  s/p dc-cv 04/2008. previously on amiodarone  . Atrial flutter (HCC)    atypical  . AV block, 1st degree    .450 msec--progressive  . 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  a- Echo 07/2008 Ef 55% mild peroprosthetic MVR with High transmitral gradient (mean 14).  b- normal coronaries by cath 12/2007  . BPH (benign prostatic hyperplasia)   . CHF (congestive heart failure) (HCC)    EF 35-40 % 2011 due to valvular disease and diastolic dysfunction  . Chronic back pain   . CKD (chronic kidney disease), stage III (HCC)   . Dysphagia    with normal barium swallow 09/2008  . Endocarditis   . GERD (gastroesophageal reflux disease)   . Heavy alcohol use  history  . History of GI bleed   . Hypertension   . IV drug abuse (HCC)    history of  . Lower GI bleed 01/2016  . Pacemaker 2010   Social History   Socioeconomic History  . Marital status: Divorced    Spouse name: Not on file  . Number of children: 9  . Years of education: Not on file  . Highest education level: Not on file  Occupational History  . Occupation: retired    Comment: [employer]  Tobacco Use  . Smoking status: Former Smoker    Years: 0.00    Types: Cigarettes  . Smokeless tobacco: Never Used  Vaping Use  . Vaping Use: Never used  Substance and Sexual Activity  . Alcohol use: No    Alcohol/week: 0.0 standard drinks  .  Drug use: No  . Sexual activity: Not Currently  Other Topics Concern  . Not on file  Social History Narrative   Current Social History 02/16/2019        Patient lives with family (baby's mama, daughter and 4 grandkids:  Two yo twins, 65 yo and 40 yo in a home which is 1 story. There are 4 steps with handrails up to the entrance the patient uses.       Patient's method of transportation is via family member (baby's mama).      The highest level of education was high school diploma.      The patient currently retired.      Identified important Relationships are "My Baby's [name], [name]."       Pets : None       Interests / Fun: Sitting outdoors watching the birds and planes."       Exercise riding a bike 5 miles 3 days/week      Current Stressors: The grandkids       Religious / Personal Beliefs: "I believe in God."       L. Ducatte, RN, BSN       Social Determinants of Health   Financial Resource Strain: Not on file  Food Insecurity: Not on file  Transportation Needs: Not on file  Physical Activity: Not on file  Stress: Not on file  Social Connections: Not on file   Family History  Problem Relation Age of Onset  . Coronary artery disease Mother   . Cancer Father        GI  . Cancer Brother        Lung    ASSESSMENT Recent Results: The most recent result is correlated with 35 mg per week: Lab Results  Component Value Date   INR 2.3 12/09/2020   INR 2.0 11/04/2020   INR 2.4 10/07/2020    Anticoagulation Dosing: Description   Take one (1) of your 5mg  peach-colored warfarin tablets by mouth, once-daily at 6PM daily on all days of the week--EXCEPT on MONDAYS and THURSDAYS--take one and one-half (1&1/2) of your 5mg  peach-colored warfarin tablets on these days.      INR today: Subtherapeutic  PLAN Weekly dose was increased by 14% to 40 mg per week  Patient Instructions  Patient instructed to take medications as defined in the Anti-coagulation Track section  of this encounter.  Patient instructed to take today's dose.  Patient instructed to take one (1) of your 5mg  peach-colored warfarin tablets by mouth, once-daily at 6PM daily on all days of the week--EXCEPT on MONDAYS and THURSDAYS--take one and one-half (1&1/2) of your 5mg  peach-colored warfarin  tablets on these days.  Patient verbalized understanding of these instructions.    Patient advised to contact clinic or seek medical attention if signs/symptoms of bleeding or thromboembolism occur.  Patient verbalized understanding by repeating back information and was advised to contact me if further medication-related questions arise. Patient was also provided an information handout.  Follow-up Return in 5 weeks (on 01/13/2021) for Follow up INR.  Elicia Lamp, PharmD, CPP  15 minutes spent face-to-face with the patient during the encounter. 50% of time spent on education, including signs/sx bleeding and clotting, as well as food and drug interactions with warfarin. 50% of time was spent on fingerprick POC INR sample collection,processing, results determination, and documentation in [email or web address].

## 2020-12-09 NOTE — Progress Notes (Signed)
   CC: F/u HTN  HPI:  [name] is a 75 y.o. very pleasant gentleman with medical history significant for well compensated heart failure with reduced ejection fraction, hypertension.  To follow-up.  Please see problem based charting for further details.   Past Medical History:  Diagnosis Date  . Atrial fibrillation (HCC)    post op.  s/p dc-cv 04/2008. previously on amiodarone  . Atrial flutter (HCC)    atypical  . AV block, 1st degree    .450 msec--progressive  . 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  a- Echo 07/2008 Ef 55% mild peroprosthetic MVR with High transmitral gradient (mean 14).  b- normal coronaries by cath 12/2007  . BPH (benign prostatic hyperplasia)   . CHF (congestive heart failure) (HCC)    EF 35-40 % 2011 due to valvular disease and diastolic dysfunction  . Chronic back pain   . CKD (chronic kidney disease), stage III (HCC)   . Dysphagia    with normal barium swallow 09/2008  . Endocarditis   . GERD (gastroesophageal reflux disease)   . Heavy alcohol use    history  . History of GI bleed   . Hypertension   . IV drug abuse (HCC)    history of  . Lower GI bleed 01/2016  . Pacemaker 2010   Review of Systems:  As per HPI  Physical Exam:  Vitals:   12/09/20 1447  BP: (!) 94/52  Pulse: 70  Temp: 98.3 F (36.8 C)  TempSrc: Oral  SpO2: 99%  Weight: 180 lb 3.2 oz (81.7 kg)  Height: 5\' 8"  (1.727 m)   Physical Exam Vitals reviewed.  Cardiovascular:     Heart sounds: Murmur (Syatolic Murmur) heard.    Pulmonary:     Breath sounds: Normal breath sounds.  Skin:    General: Skin is warm.  Neurological:     Mental Status: He is alert.  Psychiatric:        Mood and Affect: Mood normal.        Behavior: Behavior normal.     Assessment & Plan:   See Encounters Tab for problem based charting.  Patient discussed with Dr. 

## 2020-12-09 NOTE — Patient Instructions (Signed)
Patient instructed to take medications as defined in the Anti-coagulation Track section of this encounter.  Patient instructed to take today's dose.  Patient instructed to take one (1) of your 5mg  peach-colored warfarin tablets by mouth, once-daily at 6PM daily on all days of the week--EXCEPT on MONDAYS and THURSDAYS--take one and one-half (1&1/2) of your 5mg  peach-colored warfarin tablets on these days.  Patient verbalized understanding of these instructions.

## 2020-12-09 NOTE — Assessment & Plan Note (Signed)
Health Maintenance: Zoster prescription written

## 2020-12-09 NOTE — Patient Instructions (Signed)
Mr. Vanalstyne,   It was a pleasure taking care of you here in the clinic. As we discussed, I will have you HOLD THE Spironolactone and return to the clinic in 1 week for [place] to recheck your blood pressure.  Take care! Dr. Dortha Schwalbe  Please call the internal medicine center clinic if you have any questions or concerns, we may be able to help and keep you from a long and expensive emergency room wait. Our clinic and after hours phone number is [phone], the best time to call is Monday through Friday 9 am to 4 pm but there is always someone available 24/7 if you have an emergency. If you need medication refills please notify your pharmacy one week in advance and they will send [place] a request.   If you have not gotten the COVID vaccine, I recommend doing so:  You may get it at your local [facility] or [facility] OR To schedule an appointment for a COVID vaccine or be added to the vaccine wait list: Go to [email or web address]   OR Go to [email or web address]                  OR Call [phone]                                     OR Call [phone] and select Option 2

## 2020-12-09 NOTE — Assessment & Plan Note (Signed)
#  Hypertension: He was noted to have low blood pressure in the clinic today.  Per chart review, he was supposed to cut down his spironolactone from 25 mg daily to 12.5 mg daily however he was continuing to take the 25 mg daily.  Repeat blood pressures Initial blood pressure on arrival to the room was 94/52 with a pulse of 70 Lying: 116/71, pulse 71 Sitting: 118/71, pulse 72 (patient said he felt "little swimmy headed ") Standing at 0 minutes 108/75, pulse 82 Standing at 3 minutes: 115/75, pulse 73  Plan: -Discontinue spironolactone -Continue lisinopril 2.5 mg daily -Continue Coreg 6.25 mg daily -Continue Lasix -Return to clinic in 1 week for hypertension follow-up

## 2020-12-10 ENCOUNTER — Telehealth: Payer: Self-pay

## 2020-12-10 NOTE — Telephone Encounter (Signed)
Returned call to [employer]. Instructed her to have patient stop spironolactone. Also, to schedule Shingrix vaccine at Pharmacy. 1 week f/u scheduled for 6/13 at 2:15 for BP check.

## 2020-12-10 NOTE — Telephone Encounter (Signed)
Pt's caregiver requesting to speak with a nurse about meds. Please call back.

## 2020-12-11 NOTE — Progress Notes (Signed)
Internal Medicine Clinic Attending  Case discussed with Dr. Agyei  At the time of the visit.  We reviewed the resident's history and exam and pertinent patient test results.  I agree with the assessment, diagnosis, and plan of care documented in the resident's note.  

## 2020-12-11 NOTE — Progress Notes (Signed)
Patient's management reviewed and I agree with assessment and plan. 

## 2020-12-16 ENCOUNTER — Encounter: Payer: Self-pay | Admitting: Student

## 2020-12-16 ENCOUNTER — Ambulatory Visit (INDEPENDENT_AMBULATORY_CARE_PROVIDER_SITE_OTHER): Payer: 59 | Admitting: Student

## 2020-12-16 VITALS — BP 110/67 | HR 78 | Temp 98.3°F | Ht 68.0 in | Wt 184.0 lb

## 2020-12-16 DIAGNOSIS — R413 Other amnesia: Secondary | ICD-10-CM

## 2020-12-16 DIAGNOSIS — R351 Nocturia: Secondary | ICD-10-CM

## 2020-12-16 DIAGNOSIS — N401 Enlarged prostate with lower urinary tract symptoms: Secondary | ICD-10-CM | POA: Diagnosis not present

## 2020-12-16 DIAGNOSIS — I10 Essential (primary) hypertension: Secondary | ICD-10-CM | POA: Diagnosis not present

## 2020-12-16 DIAGNOSIS — I5022 Chronic systolic (congestive) heart failure: Secondary | ICD-10-CM | POA: Diagnosis not present

## 2020-12-16 MED ORDER — CARVEDILOL 6.25 MG PO TABS: 6.2500 mg | ORAL_TABLET | Freq: Two times a day (BID) | ORAL | 3 refills | Status: AC

## 2020-12-16 MED ORDER — TAMSULOSIN HCL 0.4 MG PO CAPS: 0.8000 mg | ORAL_CAPSULE | Freq: Every day | ORAL | 3 refills | Status: DC

## 2020-12-16 NOTE — Progress Notes (Signed)
   CC: Hypertension follow-up  HPI:  Mr.Ario T Mounsey is a 75 y.o. with past medical history significant for hypertension and BPH who presents to clinic for 1-week hypertension follow-up. Refer to problem list for charting of this encounter.  Past Medical History:  Diagnosis Date   Atrial fibrillation (HCC)    post op.  s/p dc-cv 04/2008. 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  a- Echo 07/2008 Ef 55% mild peroprosthetic MVR with High transmitral gradient (mean 14).  b- normal coronaries by cath 12/2007   BPH (benign prostatic hyperplasia)    CHF (congestive heart failure) (HCC)    EF 35-40 % 2011 due to valvular disease and diastolic dysfunction   Chronic back pain    CKD (chronic kidney disease), stage III (HCC)    Dysphagia    with normal barium swallow 09/2008   Endocarditis    GERD (gastroesophageal reflux disease)    Heavy alcohol use    history   History of GI bleed    Hypertension    IV drug abuse (HCC)    history of   Lower GI bleed 01/2016   Pacemaker 2010   Review of Systems: Denies dizziness, lightheadedness, falls, excessive fatigue, chest pain, shortness of breath, lower extremity swelling.  Physical Exam:  Vitals:   12/16/20 1411  BP: 110/67  Pulse: 78  Temp: 98.3 F (36.8 C)  TempSrc: Oral  SpO2: 98%  Weight: 184 lb (83.5 kg)  Height: 5\' 8"  (1.727 m)   Physical Exam Constitutional:      General: He is not in acute distress.    Appearance: He is not ill-appearing.  Cardiovascular:     Rate and Rhythm: Normal rate and regular rhythm.     Pulses: Normal pulses.     Heart sounds: Normal heart sounds.  Pulmonary:     Effort: Pulmonary effort is normal. No respiratory distress.     Breath sounds: Normal breath sounds.  Abdominal:     General: Abdomen is flat. Bowel sounds are normal.     Palpations:  Abdomen is soft.     Tenderness: There is no abdominal tenderness.  Musculoskeletal:        General: No swelling.     Right lower leg: No edema.     Left lower leg: No edema.  Neurological:     General: No focal deficit present.     Mental Status: He is alert and oriented to person, place, and time. Mental status is at baseline.  Psychiatric:        Mood and Affect: Mood normal.        Behavior: Behavior normal.        Thought Content: Thought content normal.        Judgment: Judgment normal.    Assessment & Plan:   See Encounters Tab for problem based charting.  Patient discussed with Dr.  

## 2020-12-16 NOTE — Assessment & Plan Note (Signed)
Patient appears euvolemic on examination today. Removed furosemide from patient's medication list as he has not been taking this medication for some time.

## 2020-12-16 NOTE — Assessment & Plan Note (Signed)
Since last visit, patient has discontinued spironolactone appropriately and continued taking lisinopril 2.5mg  daily and carvedilol 6.25mg  twice daily. He denies ongoing symptoms of lightheadedness or dizziness upon standing since discontinuing this medication. -Continue lisinopril 2.5mg  daily -Continue carvedilol 6.25mg  twice daily -BMP today

## 2020-12-16 NOTE — Assessment & Plan Note (Signed)
Refilled tamsulosin 0.8mg  daily after breakfast

## 2020-12-16 NOTE — Assessment & Plan Note (Signed)
Patient endorses frequent forgetfulness although he is able to perform activities of daily living independently except for driving. He lives with five grandchildren, his daughter, and a male partner. He handles his medications independently at home, however he is interested in a pill organizer. Mini Cog today of 3 (Clock drawing intact, 1 of 3 recall). -Provide pill organizer -Patient would benefit from further cognitive assessment

## 2020-12-16 NOTE — Patient Instructions (Addendum)
[name],  It was a pleasure meeting you today.  For your hypertension (high blood pressure):  -Continue NOT taking the spironolactone -Continue taking the lisinopril 2.5mg  daily -Continue taking carvedilol 6.25mg  twice daily  Sincerely, Dr. Jasmine December, MD

## 2020-12-17 LAB — BMP8+ANION GAP
Anion Gap: 17 mmol/L (ref 10.0–18.0)
BUN/Creatinine Ratio: 12 (ref 10–24)
BUN: 16 mg/dL (ref 8–27)
CO2: 21 mmol/L (ref 20–29)
Calcium: 9.6 mg/dL (ref 8.6–10.2)
Chloride: 101 mmol/L (ref 96–106)
Creatinine, Ser: 1.38 mg/dL — ABNORMAL HIGH (ref 0.76–1.27)
Glucose: 75 mg/dL (ref 65–99)
Potassium: 4.3 mmol/L (ref 3.5–5.2)
Sodium: 139 mmol/L (ref 134–144)
eGFR: 53 mL/min/{1.73_m2} — ABNORMAL LOW (ref >59)

## 2020-12-17 NOTE — Progress Notes (Signed)
Internal Medicine Clinic Attending  Case discussed with Dr. Johnson  At the time of the visit.  We reviewed the resident's history and exam and pertinent patient test results.  I agree with the assessment, diagnosis, and plan of care documented in the resident's note.  

## 2020-12-18 NOTE — Progress Notes (Signed)
Remote pacemaker transmission.   

## 2020-12-23 ENCOUNTER — Other Ambulatory Visit: Payer: Self-pay | Admitting: Internal Medicine

## 2020-12-23 ENCOUNTER — Encounter (HOSPITAL_COMMUNITY): Payer: Self-pay | Admitting: *Deleted

## 2021-01-06 ENCOUNTER — Other Ambulatory Visit: Payer: Self-pay | Admitting: Internal Medicine

## 2021-01-13 ENCOUNTER — Ambulatory Visit (INDEPENDENT_AMBULATORY_CARE_PROVIDER_SITE_OTHER): Payer: 59 | Admitting: Pharmacist

## 2021-01-13 DIAGNOSIS — Z7901 Long term (current) use of anticoagulants: Secondary | ICD-10-CM

## 2021-01-13 DIAGNOSIS — Z952 Presence of prosthetic heart valve: Secondary | ICD-10-CM

## 2021-01-13 LAB — POCT INR: INR: 3.5 — AB (ref 2.0–3.0)

## 2021-01-13 NOTE — Patient Instructions (Signed)
Patient instructed to take medications as defined in the Anti-coagulation Track section of this encounter.  Patient instructed to take today's dose.  Patient instructed to take one (1) of your 5mg  peach-colored warfarin tablets by mouth, once-daily at 6PM daily on all days of the week--EXCEPT on MONDAYS --take one and one-half (1&1/2) of your 5mg  peach-colored warfarin tablets on MONDAYS.  Patient verbalized understanding of these instructions.

## 2021-01-13 NOTE — Progress Notes (Signed)
Anticoagulation Management [name] is a 75 y.o. male who reports to the clinic for monitoring of warfarin treatment.    Indication:  History of mitral valve replacement with mechanical valve. Long term current use of warfarin to maintain INR 2.5 - 3.5.   Duration: indefinite Supervising physician:  Reymundo Poll, MD  Anticoagulation Clinic Visit History: Patient does not report signs/symptoms of bleeding or thromboembolism.   Other recent changes: No diet, medications, lifestyle changes endorsed by the patient at this visit.  Anticoagulation Episode Summary     Current INR goal:  2.5-3.5  TTR:  61.5 % (8.3 y)  Next INR check:  02/10/2021  INR from last check:  3.5 (01/13/2021)  Weekly max warfarin dose:    Target end date:  Indefinite  INR check location:  Anticoagulation Clinic  Preferred lab:    Send INR reminders to:  [facility]   Indications   IRREGULAR PULSE [I49.9] Long term (current) use of anticoagulants (Resolved) [Z79.01] ATRIAL FLUTTER (Resolved) [I48.92] H/O mitral valve replacement with mechanical valve [Z95.2]        Comments:          Anticoagulation Care Providers     Provider Role Specialty Phone number   [name], MD  Cardiology [phone]       No Known Allergies  Current Outpatient Medications:    aspirin EC 81 MG tablet, Take 1 tablet (81 mg total) by mouth daily., Disp: 90 tablet, Rfl: 3   carvedilol (COREG) 6.25 MG tablet, Take 1 tablet (6.25 mg total) by mouth 2 (two) times daily with a meal., Disp: 180 tablet, Rfl: 3   colchicine 0.6 MG tablet, TAKE 1 TABLET BY MOUTH EVERY DAY, Disp: 90 tablet, Rfl: 0   lisinopril (ZESTRIL) 2.5 MG tablet, TAKE 1 TABLET BY MOUTH EVERY DAY **NEED APPT FOR FURTHER REFILLS**, Disp: 60 tablet, Rfl: 0   tamsulosin (FLOMAX) 0.4 MG CAPS capsule, Take 2 capsules (0.8 mg total) by mouth daily after breakfast., Disp: 180 capsule, Rfl: 3   warfarin (COUMADIN) 5 MG tablet, Take one tablet by  mouth, once-daily--EXCEPT on Thursdays, take ONLY 1/2 tablet on Thursdays., Disp: 78 tablet, Rfl: 1 Past Medical History:  Diagnosis Date   Atrial fibrillation (HCC)    post op.  s/p dc-cv 04/2008. 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  a- Echo 07/2008 Ef 55% mild peroprosthetic MVR with High transmitral gradient (mean 14).  b- normal coronaries by cath 12/2007   BPH (benign prostatic hyperplasia)    CHF (congestive heart failure) (HCC)    EF 35-40 % 2011 due to valvular disease and diastolic dysfunction   Chronic back pain    CKD (chronic kidney disease), stage III (HCC)    Dysphagia    with normal barium swallow 09/2008   Endocarditis    GERD (gastroesophageal reflux disease)    Heavy alcohol use    history   History of GI bleed    Hypertension    IV drug abuse (HCC)    history of   Lower GI bleed 01/2016   Pacemaker 2010   Social History   Socioeconomic History   Marital status: Divorced    Spouse name: Not on file   Number of children: 9   Years of education: Not on file   Highest  education level: Not on file  Occupational History   Occupation: retired    Comment: [employer]  Tobacco Use   Smoking status: Former    Years: 0.00    Pack years: 0.00    Types: Cigarettes   Smokeless tobacco: Never  Vaping Use   Vaping Use: Never used  Substance and Sexual Activity   Alcohol use: No    Alcohol/week: 0.0 standard drinks   Drug use: No   Sexual activity: Not Currently  Other Topics Concern   Not on file  Social History Narrative   Current Social History 02/16/2019        Patient lives with family (baby's mama, daughter and 4 grandkids:  Two yo twins, 49 yo and 77 yo in a home which is 1 story. There are 4 steps with handrails up to the entrance the patient uses.       Patient's method of transportation is via family  member (baby's mama).      The highest level of education was high school diploma.      The patient currently retired.      Identified important Relationships are "My Baby's [name], [name]."       Pets : None       Interests / Fun: Sitting outdoors watching the birds and planes."       Exercise riding a bike 5 miles 3 days/week      Current Stressors: The grandkids       Religious / Personal Beliefs: "I believe in God."       L. Ducatte, RN, BSN       Social Determinants of Health   Financial Resource Strain: Not on file  Food Insecurity: Not on file  Transportation Needs: Not on file  Physical Activity: Not on file  Stress: Not on file  Social Connections: Not on file   Family History  Problem Relation Age of Onset   Coronary artery disease Mother    Cancer Father        GI   Cancer Brother        Lung    ASSESSMENT Recent Results: The most recent result is correlated with 40 mg per week: Lab Results  Component Value Date   INR 3.5 (A) 01/13/2021   INR 2.3 12/09/2020   INR 2.0 11/04/2020    Anticoagulation Dosing: Description   Take one (1) of your 5mg  peach-colored warfarin tablets by mouth, once-daily at 6PM daily on all days of the week--EXCEPT on MONDAYS --take one and one-half (1&1/2) of your 5mg  peach-colored warfarin tablets on MONDAYS.      INR today: Therapeutic  PLAN Weekly dose was decreased by 6% to 37.5 mg per week  Patient Instructions  Patient instructed to take medications as defined in the Anti-coagulation Track section of this encounter.  Patient instructed to take today's dose.  Patient instructed to take one (1) of your 5mg  peach-colored warfarin tablets by mouth, once-daily at 6PM daily on all days of the week--EXCEPT on MONDAYS --take one and one-half (1&1/2) of your 5mg  peach-colored warfarin tablets on MONDAYS.  Patient verbalized understanding of these instructions.   Patient advised to contact clinic or seek medical  attention if signs/symptoms of bleeding or thromboembolism occur.  Patient verbalized understanding by repeating back information and was advised to contact me if further medication-related questions arise. Patient was also provided an information handout.  Follow-up Return in 4 weeks (on 02/10/2021) for Follow up INR.   Aemon Koeller, PharmD, CPP  15 minutes spent face-to-face with the patient during the encounter. 50% of time spent on education, including signs/sx bleeding and clotting, as well as food and drug interactions with warfarin. 50% of time was spent on fingerprick POC INR sample collection,processing, results determination, and documentation in [email or web address].

## 2021-01-20 NOTE — Progress Notes (Signed)
INTERNAL MEDICINE TEACHING ATTENDING ADDENDUM   I agree with pharmacy recommendations as outlined in their note.   Sheretta Grumbine, MD  

## 2021-02-10 ENCOUNTER — Ambulatory Visit (INDEPENDENT_AMBULATORY_CARE_PROVIDER_SITE_OTHER): Payer: 59 | Admitting: Pharmacist

## 2021-02-10 ENCOUNTER — Encounter: Payer: Self-pay | Admitting: Internal Medicine

## 2021-02-10 ENCOUNTER — Ambulatory Visit (INDEPENDENT_AMBULATORY_CARE_PROVIDER_SITE_OTHER): Payer: 59 | Admitting: Internal Medicine

## 2021-02-10 VITALS — BP 105/68 | HR 69 | Temp 98.2°F | Wt 181.0 lb

## 2021-02-10 DIAGNOSIS — M79604 Pain in right leg: Secondary | ICD-10-CM | POA: Diagnosis not present

## 2021-02-10 DIAGNOSIS — Z7901 Long term (current) use of anticoagulants: Secondary | ICD-10-CM | POA: Diagnosis not present

## 2021-02-10 DIAGNOSIS — Z952 Presence of prosthetic heart valve: Secondary | ICD-10-CM | POA: Diagnosis not present

## 2021-02-10 DIAGNOSIS — I5022 Chronic systolic (congestive) heart failure: Secondary | ICD-10-CM

## 2021-02-10 DIAGNOSIS — M79605 Pain in left leg: Secondary | ICD-10-CM

## 2021-02-10 LAB — POCT INR: INR: 3.4 — AB (ref 2.0–3.0)

## 2021-02-10 MED ORDER — DICLOFENAC SODIUM 1 % EX GEL: 4.0000 g | Freq: Four times a day (QID) | CUTANEOUS | 0 refills | Status: DC

## 2021-02-10 NOTE — Patient Instructions (Signed)
Thank you, [name] for allowing [place] to provide your care today. Today we discussed leg pain.    Labs Ordered: Lab Orders  No laboratory test(s) ordered today     Tests Ordered: No orders of the defined types were placed in this encounter.     Referrals Ordered:  Referral Orders  No referral(s) requested today     Medication Changes:  There are no discontinued medications.   Meds ordered this encounter  Medications   diclofenac Sodium (VOLTAREN) 1 % GEL    Sig: Apply 4 g topically 4 (four) times daily.    Dispense:  350 g    Refill:  0     Health Maintenance Screening: There are no preventive care reminders to display for this patient.     Follow up:  2 weeks    Remember: If you have any questions or concerns, call our clinic at [phone] or after hours call [phone] and ask for the internal medicine resident on call.  Dellia Cloud, D.O. [facility]

## 2021-02-10 NOTE — Progress Notes (Signed)
Anticoagulation Management [name] is a 75 y.o. male who reports to the clinic for monitoring of warfarin treatment.    Indication:  History of mitral-valve replacement with mechanical valve; long term current use of warfarin oral anticoagulant to maintain INR 2.5 - 3.5.    Duration: indefinite Supervising physician: Debe Coder  Anticoagulation Clinic Visit History: Patient does not report signs/symptoms of bleeding or thromboembolism  Other recent changes: No diet, medications, lifestyle changes endorsed.  Anticoagulation Episode Summary     Current INR goal:  2.5-3.5  TTR:  61.9 % (8.4 y)  Next INR check:  03/17/2021  INR from last check:  3.4 (02/10/2021)  Weekly max warfarin dose:    Target end date:  Indefinite  INR check location:  Anticoagulation Clinic  Preferred lab:    Send INR reminders to:  [facility]   Indications   IRREGULAR PULSE [I49.9] Long term (current) use of anticoagulants (Resolved) [Z79.01] ATRIAL FLUTTER (Resolved) [I48.92] H/O mitral valve replacement with mechanical valve [Z95.2]        Comments:          Anticoagulation Care Providers     Provider Role Specialty Phone number   [name], MD  Cardiology [phone]       No Known Allergies  Current Outpatient Medications:    aspirin EC 81 MG tablet, Take 1 tablet (81 mg total) by mouth daily., Disp: 90 tablet, Rfl: 3   carvedilol (COREG) 6.25 MG tablet, Take 1 tablet (6.25 mg total) by mouth 2 (two) times daily with a meal., Disp: 180 tablet, Rfl: 3   colchicine 0.6 MG tablet, TAKE 1 TABLET BY MOUTH EVERY DAY, Disp: 90 tablet, Rfl: 0   lisinopril (ZESTRIL) 2.5 MG tablet, TAKE 1 TABLET BY MOUTH EVERY DAY **NEED APPT FOR FURTHER REFILLS**, Disp: 60 tablet, Rfl: 0   tamsulosin (FLOMAX) 0.4 MG CAPS capsule, Take 2 capsules (0.8 mg total) by mouth daily after breakfast., Disp: 180 capsule, Rfl: 3   warfarin (COUMADIN) 5 MG tablet, Take one tablet by mouth, once-daily--EXCEPT  on Thursdays, take ONLY 1/2 tablet on Thursdays., Disp: 78 tablet, Rfl: 1 Past Medical History:  Diagnosis Date   Atrial fibrillation (HCC)    post op.  s/p dc-cv 04/2008. 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  a- Echo 07/2008 Ef 55% mild peroprosthetic MVR with High transmitral gradient (mean 14).  b- normal coronaries by cath 12/2007   BPH (benign prostatic hyperplasia)    CHF (congestive heart failure) (HCC)    EF 35-40 % 2011 due to valvular disease and diastolic dysfunction   Chronic back pain    CKD (chronic kidney disease), stage III (HCC)    Dysphagia    with normal barium swallow 09/2008   Endocarditis    GERD (gastroesophageal reflux disease)    Heavy alcohol use    history   History of GI bleed    Hypertension    IV drug abuse (HCC)    history of   Lower GI bleed 01/2016   Pacemaker 2010   Social History   Socioeconomic History   Marital status: Divorced    Spouse name: Not on file   Number of children: 9   Years of education: Not on file   Highest education level: Not on file  Occupational  History   Occupation: retired    Comment: [employer]  Tobacco Use   Smoking status: Former    Years: 0.00    Types: Cigarettes   Smokeless tobacco: Never  Vaping Use   Vaping Use: Never used  Substance and Sexual Activity   Alcohol use: No    Alcohol/week: 0.0 standard drinks   Drug use: No   Sexual activity: Not Currently  Other Topics Concern   Not on file  Social History Narrative   Current Social History 02/16/2019        Patient lives with family (baby's mama, daughter and 4 grandkids:  Two yo twins, 61 yo and 25 yo in a home which is 1 story. There are 4 steps with handrails up to the entrance the patient uses.       Patient's method of transportation is via family member (baby's mama).      The highest level  of education was high school diploma.      The patient currently retired.      Identified important Relationships are "My Baby's [name], [name]."       Pets : None       Interests / Fun: Sitting outdoors watching the birds and planes."       Exercise riding a bike 5 miles 3 days/week      Current Stressors: The grandkids       Religious / Personal Beliefs: "I believe in God."       L. Ducatte, RN, BSN       Social Determinants of Health   Financial Resource Strain: Not on file  Food Insecurity: Not on file  Transportation Needs: Not on file  Physical Activity: Not on file  Stress: Not on file  Social Connections: Not on file   Family History  Problem Relation Age of Onset   Coronary artery disease Mother    Cancer Father        GI   Cancer Brother        Lung    ASSESSMENT Recent Results: The most recent result is correlated with 37.5 mg per week: Lab Results  Component Value Date   INR 3.4 (A) 02/10/2021   INR 3.5 (A) 01/13/2021   INR 2.3 12/09/2020    Anticoagulation Dosing: Description   Take one (1) of your 5mg  peach-colored warfarin tablets by mouth, once-daily at [facility] daily.      INR today: Therapeutic  PLAN Weekly dose was decreased by 7% to 35 mg per week  Patient Instructions  Patient instructed to take medications as defined in the Anti-coagulation Track section of this encounter.  Patient instructed to take today's dose.  Patient instructed to take one (1) of your 5mg  peach-colored warfarin tablets by mouth, once-daily at [facility].  Patient verbalized understanding of these instructions.   Patient advised to contact clinic or seek medical attention if signs/symptoms of bleeding or thromboembolism occur.  Patient verbalized understanding by repeating back information and was advised to contact me if further medication-related questions arise. Patient was also provided an information handout.  Follow-up Return in 5 weeks (on 03/17/2021) for  Follow up INR.  [facility], PharmD, CPP  15 minutes spent face-to-face with the patient during the encounter. 50% of time spent on education, including signs/sx bleeding and clotting, as well as food and drug interactions with warfarin. 50% of time was spent on fingerprick POC INR sample collection,processing, results determination, and documentation in 05/17/2021.

## 2021-02-10 NOTE — Progress Notes (Signed)
CC: Leg pain  HPI:  [name] is a 75 y.o. male with a past medical history stated below and presents today for leg pain. Please see problem based assessment and plan for additional details.  Past Medical History:  Diagnosis Date   Atrial fibrillation (HCC)    post op.  s/p dc-cv 04/2008. 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  a- Echo 07/2008 Ef 55% mild peroprosthetic MVR with High transmitral gradient (mean 14).  b- normal coronaries by cath 12/2007   BPH (benign prostatic hyperplasia)    CHF (congestive heart failure) (HCC)    EF 35-40 % 2011 due to valvular disease and diastolic dysfunction   Chronic back pain    CKD (chronic kidney disease), stage III (HCC)    Dysphagia    with normal barium swallow 09/2008   Endocarditis    GERD (gastroesophageal reflux disease)    Heavy alcohol use    history   History of GI bleed    Hypertension    IV drug abuse (HCC)    history of   Lower GI bleed 01/2016   Pacemaker 2010    Current Outpatient Medications on File Prior to Visit  Medication Sig Dispense Refill   aspirin EC 81 MG tablet Take 1 tablet (81 mg total) by mouth daily. 90 tablet 3   carvedilol (COREG) 6.25 MG tablet Take 1 tablet (6.25 mg total) by mouth 2 (two) times daily with a meal. 180 tablet 3   colchicine 0.6 MG tablet TAKE 1 TABLET BY MOUTH EVERY DAY 90 tablet 0   lisinopril (ZESTRIL) 2.5 MG tablet TAKE 1 TABLET BY MOUTH EVERY DAY **NEED APPT FOR FURTHER REFILLS** 60 tablet 0   tamsulosin (FLOMAX) 0.4 MG CAPS capsule Take 2 capsules (0.8 mg total) by mouth daily after breakfast. 180 capsule 3   warfarin (COUMADIN) 5 MG tablet Take one tablet by mouth, once-daily--EXCEPT on Thursdays, take ONLY 1/2 tablet on Thursdays. 78 tablet 1   No current facility-administered medications on file prior to visit.     Family History  Problem Relation Age of Onset   Coronary artery disease Mother    Cancer Father        GI   Cancer Brother        Lung    Social History   Socioeconomic History   Marital status: Divorced    Spouse name: Not on file   Number of children: 9   Years of education: Not on file   Highest education level: Not on file  Occupational History   Occupation: retired    Comment: [employer]  Tobacco Use   Smoking status: Former    Years: 0.00    Types: Cigarettes   Smokeless tobacco: Never  Vaping Use   Vaping Use: Never used  Substance and Sexual Activity   Alcohol use: No    Alcohol/week: 0.0 standard drinks   Drug use: No   Sexual activity: Not Currently  Other Topics Concern   Not on file  Social History Narrative   Current Social History 02/16/2019        Patient lives with family (baby's mama, daughter and 4 grandkids:  Two yo twins, 23 yo and 41 yo in a home which is 1 story. There are 4 steps with handrails  up to the entrance the patient uses.       Patient's method of transportation is via family member (baby's mama).      The highest level of education was high school diploma.      The patient currently retired.      Identified important Relationships are "My Baby's [name], [name]."       Pets : None       Interests / Fun: Sitting outdoors watching the birds and planes."       Exercise riding a bike 5 miles 3 days/week      Current Stressors: The grandkids       Religious / Personal Beliefs: "I believe in God."       L. Ducatte, RN, BSN       Social Determinants of Health   Financial Resource Strain: Not on file  Food Insecurity: Not on file  Transportation Needs: Not on file  Physical Activity: Not on file  Stress: Not on file  Social Connections: Not on file  Intimate Partner Violence: Not on file    Review of Systems: ROS negative except for what is noted on the assessment and plan.  Vitals:   02/10/21 1431  BP: 105/68   Pulse: 69  Temp: 98.2 F (36.8 C)  TempSrc: Oral  SpO2: 100%  Weight: 181 lb (82.1 kg)    Physical Exam: Gen: A&O x3 and in no apparent distress, well appearing and nourished. Neck: no obvious masses or nodules, AROM intact. No elevation in JVD CV: RRR, no murmurs, rubs, or gallops. S1/S2 presents, 1-2+ pitting edema in his lower extremities  Resp: Clear to ascultation bilaterally  Abd: BS (+) x4, soft, non-tender, without obvious hepatosplenomegaly or masses MSK: Grossly normal AROM and strength x4 extremities. TTP of anterior tibialis muscle bilaterally. Skin: good skin turgor, no rashes, unusual bruising, or prominent lesions.  Neuro: No focal deficits, grossly normal sensation and coordination.  Psych: Oriented x3 and responding appropriately. Intact recent and remote memory, normal mood, judgement, affect , and insight.    Assessment & Plan:   See Encounters Tab for problem based charting.  Patient discussed with Dr. Clinton Gallant, D.O. [facility] Internal [facility], PGY-3 Pager: [phone], Phone: [phone]

## 2021-02-10 NOTE — Patient Instructions (Signed)
Patient instructed to take medications as defined in the Anti-coagulation Track section of this encounter.  Patient instructed to take today's dose.  Patient instructed to take one (1) of your 5mg peach-colored warfarin tablets by mouth, once-daily at 6PM. Patient verbalized understanding of these instructions.    

## 2021-02-12 ENCOUNTER — Encounter: Payer: Self-pay | Admitting: Internal Medicine

## 2021-02-12 DIAGNOSIS — M79669 Pain in unspecified lower leg: Secondary | ICD-10-CM | POA: Insufficient documentation

## 2021-02-12 DIAGNOSIS — M79606 Pain in leg, unspecified: Secondary | ICD-10-CM | POA: Insufficient documentation

## 2021-02-12 NOTE — Assessment & Plan Note (Signed)
Patient has evidence of 1-2+ pitting edema in bilateral lower extremities, without any additional evidence of CHF exacerbation. He is not currently on a diuretic. Will bring him back in a few weeks to discuss his chronic care management.

## 2021-02-12 NOTE — Assessment & Plan Note (Signed)
Assessment: Patient presents with a 1 month history of bilateral anterior shin pain. He describes the pain as achy/sore. The pain comes and goes and is worse with movement and palpation. He denies any true claudication symptoms but admits that he does not usually walk very far. Additionally, he denies pain when elevating his legs. He states that rubbing alcohol improves his symptoms. He believes his symptoms are 2/2 using his motorized bike. He states that he routinely has to crank it with his leg. It can take up to 8-10 times to crank. He will often have to switch legs to start the bike.  On exam , he has normal strength and tone in his bilateral lower extremities. There is point tenderness in the muscle belly of his anterior tibialis muscle. No evidence of compression neuroapthy at the common peroneal nerve. Nor is there evidence focal of bone pain on his anterior tibia,   Pain likely secondary to muscle strain/overuse injury=   Plan: - Activity modifications (I.e. stop riding bike for a while) - Voltaren gel for pain

## 2021-02-22 NOTE — Progress Notes (Signed)
Evaluation and management procedures were performed by the Clinical Pharmacy Practitioner under my supervision and collaboration. I have reviewed the Practitioner's note and chart, and I agree with the management and plan as documented above. ° °

## 2021-02-22 NOTE — Progress Notes (Signed)
Internal Medicine Clinic Attending  Case discussed with Dr. Coe  At the time of the visit.  We reviewed the resident's history and exam and pertinent patient test results.  I agree with the assessment, diagnosis, and plan of care documented in the resident's note.  

## 2021-02-24 ENCOUNTER — Encounter: Payer: Self-pay | Admitting: Internal Medicine

## 2021-02-24 ENCOUNTER — Other Ambulatory Visit: Payer: Self-pay

## 2021-02-24 ENCOUNTER — Ambulatory Visit (INDEPENDENT_AMBULATORY_CARE_PROVIDER_SITE_OTHER): Payer: 59 | Admitting: Internal Medicine

## 2021-02-24 VITALS — BP 107/79 | HR 77 | Temp 98.2°F | Resp 28 | Ht 68.0 in | Wt 179.8 lb

## 2021-02-24 DIAGNOSIS — Z8679 Personal history of other diseases of the circulatory system: Secondary | ICD-10-CM

## 2021-02-24 DIAGNOSIS — I10 Essential (primary) hypertension: Secondary | ICD-10-CM

## 2021-02-24 DIAGNOSIS — I5022 Chronic systolic (congestive) heart failure: Secondary | ICD-10-CM

## 2021-02-24 DIAGNOSIS — N1831 Chronic kidney disease, stage 3a: Secondary | ICD-10-CM

## 2021-02-24 DIAGNOSIS — R059 Cough, unspecified: Secondary | ICD-10-CM | POA: Diagnosis not present

## 2021-02-24 NOTE — Patient Instructions (Signed)
Thank you, [name] for allowing [place] to provide your care today. Today we discussed heart failure, blood pressure, cholesterol, cough.    Labs Ordered:  Lab Orders         Novel Coronavirus, NAA ([facility])      Tests Ordered: Orders Placed This Encounter  Procedures   Novel Coronavirus, NAA ([facility])      Referrals Ordered:  Referral Orders  No referral(s) requested today     Medication Changes:  There are no discontinued medications.   No orders of the defined types were placed in this encounter.    Health Maintenance Screening: There are no preventive care reminders to display for this patient.   Instructions:   Follow up: 4-6 months   Remember: If you have any questions or concerns, call our clinic at [phone] or after hours call [phone] and ask for the internal medicine resident on call.  Dellia Cloud, D.O. [facility]

## 2021-02-24 NOTE — Progress Notes (Signed)
CC: HTN  HPI:  [name] is a 75 y.o. male with a past medical history stated below and presents today for HTN. Please see problem based assessment and plan for additional details.  Past Medical History:  Diagnosis Date   Anemia 01/29/2016   Atrial fibrillation (HCC)    post op.  s/p dc-cv 04/2008. 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  a- Echo 07/2008 Ef 55% mild peroprosthetic MVR with High transmitral gradient (mean 14).  b- normal coronaries by cath 12/2007   BPH (benign prostatic hyperplasia)    Cardiac resynchronization therapy pacemaker (CRT-P) in place 06/22/2011   CHF (congestive heart failure) (HCC)    EF 35-40 % 2011 due to valvular disease and diastolic dysfunction   Chronic back pain    CKD (chronic kidney disease), stage III (HCC)    COVID-19 virus infection 07/24/2020   Tested positive on July 16, 2020   Dysphagia    with normal barium swallow 09/2008   Endocarditis    GERD (gastroesophageal reflux disease)    Heavy alcohol use    history   History of GI bleed    Hypertension    IV drug abuse (HCC)    history of   Lower GI bleed 01/2016   Pacemaker 2010    Current Outpatient Medications on File Prior to Visit  Medication Sig Dispense Refill   aspirin EC 81 MG tablet Take 1 tablet (81 mg total) by mouth daily. 90 tablet 3   carvedilol (COREG) 6.25 MG tablet Take 1 tablet (6.25 mg total) by mouth 2 (two) times daily with a meal. 180 tablet 3   colchicine 0.6 MG tablet TAKE 1 TABLET BY MOUTH EVERY DAY 90 tablet 0   diclofenac Sodium (VOLTAREN) 1 % GEL Apply 4 g topically 4 (four) times daily. 350 g 0   lisinopril (ZESTRIL) 2.5 MG tablet TAKE 1 TABLET BY MOUTH EVERY DAY **NEED APPT FOR FURTHER REFILLS** 60 tablet 0   tamsulosin (FLOMAX) 0.4 MG CAPS capsule Take 2 capsules (0.8 mg total) by mouth daily  after breakfast. 180 capsule 3   warfarin (COUMADIN) 5 MG tablet Take one tablet by mouth, once-daily--EXCEPT on Thursdays, take ONLY 1/2 tablet on Thursdays. 78 tablet 1   No current facility-administered medications on file prior to visit.    Family History  Problem Relation Age of Onset   Coronary artery disease Mother    Cancer Father        GI   Cancer Brother        Lung    Social History   Socioeconomic History   Marital status: Divorced    Spouse name: Not on file   Number of children: 9   Years of education: Not on file   Highest education level: Not on file  Occupational History   Occupation: retired    Comment: [employer]  Tobacco Use   Smoking status: Former    Years: 0.00    Types: Cigarettes   Smokeless tobacco: Never  Vaping Use   Vaping Use: Never used  Substance and Sexual Activity   Alcohol use: No    Alcohol/week: 0.0 standard drinks   Drug use: No   Sexual activity: Not Currently  Other Topics Concern   Not on file  Social History Narrative  Current Social History 02/16/2019        Patient lives with family (baby's mama, daughter and 4 grandkids:  Two yo twins, 81 yo and 47 yo in a home which is 1 story. There are 4 steps with handrails up to the entrance the patient uses.       Patient's method of transportation is via family member (baby's mama).      The highest level of education was high school diploma.      The patient currently retired.      Identified important Relationships are "My Baby's [name], [name]."       Pets : None       Interests / Fun: Sitting outdoors watching the birds and planes."       Exercise riding a bike 5 miles 3 days/week      Current Stressors: The grandkids       Religious / Personal Beliefs: "I believe in God."       L. Ducatte, RN, BSN       Social Determinants of Health   Financial Resource Strain: Not on file  Food Insecurity: Not on file  Transportation Needs: Not on file  Physical  Activity: Not on file  Stress: Not on file  Social Connections: Not on file  Intimate Partner Violence: Not on file    Review of Systems: ROS negative except for what is noted on the assessment and plan.  Vitals:   02/24/21 1032  BP: 107/79  Pulse: 77  Resp: (!) 28  Temp: 98.2 F (36.8 C)  TempSrc: Oral  SpO2: 100%  Weight: 179 lb 12.8 oz (81.6 kg)  Height: 5\' 8"  (1.727 m)    Physical Exam: Gen: A&O x3 and in no apparent distress, well appearing and nourished. HEENT: Head - normocephalic, atraumatic. Eye -  visual acuity grossly intact, conjunctiva clear, sclera non-icteric, EOM intact. Mouth - No obvious caries or periodontal disease. Neck: no obvious masses or nodules, AROM intact. No elevation in JVD CV: RRR, positive murmurs(click/mechanical valve), rubs, or gallops. S1/S2 presents. 1+ lower extremity edema bilaterally. Resp: Clear to ascultation bilaterally  Abd: BS (+) x4, soft, non-tender, without obvious hepatosplenomegaly or masses MSK: Grossly normal AROM and strength x4 extremities. Skin: good skin turgor, no rashes, unusual bruising, or prominent lesions.  Neuro: No focal deficits, grossly normal sensation and coordination.  Psych: Oriented x3 and responding appropriately. Intact recent and remote memory, normal mood, judgement, affect , and insight.    Assessment & Plan:   See Encounters Tab for problem based charting.  Patient discussed with Dr. , D.O. [facility], PGY-3 Pager: [phone], Phone: [phone] Date 02/25/2021 Time 7:04 AM

## 2021-02-25 ENCOUNTER — Encounter: Payer: Self-pay | Admitting: Internal Medicine

## 2021-02-25 ENCOUNTER — Ambulatory Visit (INDEPENDENT_AMBULATORY_CARE_PROVIDER_SITE_OTHER): Payer: 59

## 2021-02-25 DIAGNOSIS — I5022 Chronic systolic (congestive) heart failure: Secondary | ICD-10-CM

## 2021-02-25 DIAGNOSIS — R059 Cough, unspecified: Secondary | ICD-10-CM | POA: Insufficient documentation

## 2021-02-25 DIAGNOSIS — R053 Chronic cough: Secondary | ICD-10-CM | POA: Insufficient documentation

## 2021-02-25 LAB — CUP PACEART REMOTE DEVICE CHECK
Battery Remaining Longevity: 61 mo
Battery Remaining Percentage: 69 %
Battery Voltage: 2.99 V
Date Time Interrogation Session: 20220823020017
Implantable Lead Implant Date: 20120905
Implantable Lead Implant Date: 20120905
Implantable Lead Implant Date: 20120905
Implantable Lead Location: 753858
Implantable Lead Location: 753859
Implantable Lead Location: 753860
Implantable Pulse Generator Implant Date: 20190610
Lead Channel Impedance Value: 390 Ohm
Lead Channel Impedance Value: 640 Ohm
Lead Channel Pacing Threshold Amplitude: 1.25 V
Lead Channel Pacing Threshold Amplitude: 1.5 V
Lead Channel Pacing Threshold Pulse Width: 0.5 ms
Lead Channel Pacing Threshold Pulse Width: 0.6 ms
Lead Channel Sensing Intrinsic Amplitude: 12 mV
Lead Channel Setting Pacing Amplitude: 2.25 V
Lead Channel Setting Pacing Amplitude: 2.5 V
Lead Channel Setting Pacing Pulse Width: 0.5 ms
Lead Channel Setting Pacing Pulse Width: 0.6 ms
Lead Channel Setting Sensing Sensitivity: 2 mV
Pulse Gen Model: 3222
Pulse Gen Serial Number: 9022287

## 2021-02-25 LAB — SARS-COV-2, NAA 2 DAY TAT

## 2021-02-25 LAB — NOVEL CORONAVIRUS, NAA: SARS-CoV-2, NAA: NOT DETECTED

## 2021-02-25 NOTE — Assessment & Plan Note (Signed)
CKD stable.  No evidence of symptoms.  We will continue to avoid nephrotoxic medications and check kidney function once yearly

## 2021-02-25 NOTE — Assessment & Plan Note (Signed)
Patient presents with cough and is concerned about COVID-19 infection.  He denies any additional symptoms.  He denies any recent travel or sick contacts.  He has up-to-date on his COVID vaccines and booster.  -We will screen for COVID-19 today.

## 2021-02-25 NOTE — Progress Notes (Signed)
Patient called.  Patient aware.  

## 2021-02-25 NOTE — Assessment & Plan Note (Signed)
Patient presents for further evaluation management of his blood pressure.  Blood pressure today is 107/79.  Denies any symptoms of hypotension.  Tolerating his carvedilol 6.25 mg twice daily and lisinopril 2.5 mg daily.  Plan: -Hypertensive medications.

## 2021-02-25 NOTE — Assessment & Plan Note (Signed)
Patient presents for further evaluation and management of his systolic heart failure.  Patient is currently on carvedilol 6.25 mg twice daily, lisinopril 2.5 mg daily and doing well.  He denies any signs or symptoms of CHF exacerbation.  He states that he exercises regularly by riding his bike without any limitations.  Plan: -Continue current medication management -Continue to monitor for signs of hypervolemia -Hold off on diuretics as he remains euvolemic

## 2021-02-27 NOTE — Progress Notes (Signed)
Internal Medicine Clinic Attending  Case discussed with Dr. Coe  At the time of the visit.  We reviewed the resident's history and exam and pertinent patient test results.  I agree with the assessment, diagnosis, and plan of care documented in the resident's note.  

## 2021-02-28 ENCOUNTER — Other Ambulatory Visit: Payer: Self-pay | Admitting: Internal Medicine

## 2021-02-28 DIAGNOSIS — M10072 Idiopathic gout, left ankle and foot: Secondary | ICD-10-CM

## 2021-03-09 ENCOUNTER — Other Ambulatory Visit: Payer: Self-pay | Admitting: Internal Medicine

## 2021-03-09 DIAGNOSIS — M79604 Pain in right leg: Secondary | ICD-10-CM

## 2021-03-11 ENCOUNTER — Encounter: Payer: 59 | Admitting: Internal Medicine

## 2021-03-12 NOTE — Progress Notes (Signed)
Remote pacemaker transmission.   

## 2021-03-13 ENCOUNTER — Other Ambulatory Visit: Payer: Self-pay | Admitting: Internal Medicine

## 2021-03-17 ENCOUNTER — Ambulatory Visit: Payer: 59

## 2021-03-17 ENCOUNTER — Encounter: Payer: 59 | Admitting: Internal Medicine

## 2021-03-24 ENCOUNTER — Ambulatory Visit: Payer: 59

## 2021-03-24 ENCOUNTER — Encounter: Payer: 59 | Admitting: Internal Medicine

## 2021-04-21 ENCOUNTER — Ambulatory Visit (INDEPENDENT_AMBULATORY_CARE_PROVIDER_SITE_OTHER): Payer: 59 | Admitting: Pharmacist

## 2021-04-21 ENCOUNTER — Other Ambulatory Visit: Payer: Self-pay

## 2021-04-21 ENCOUNTER — Ambulatory Visit (INDEPENDENT_AMBULATORY_CARE_PROVIDER_SITE_OTHER): Payer: 59 | Admitting: Internal Medicine

## 2021-04-21 ENCOUNTER — Encounter: Payer: Self-pay | Admitting: Internal Medicine

## 2021-04-21 VITALS — BP 129/64 | HR 99 | Temp 98.1°F | Ht 68.0 in | Wt 184.8 lb

## 2021-04-21 DIAGNOSIS — Z23 Encounter for immunization: Secondary | ICD-10-CM | POA: Diagnosis not present

## 2021-04-21 DIAGNOSIS — K4090 Unilateral inguinal hernia, without obstruction or gangrene, not specified as recurrent: Secondary | ICD-10-CM | POA: Insufficient documentation

## 2021-04-21 DIAGNOSIS — I4892 Unspecified atrial flutter: Secondary | ICD-10-CM | POA: Diagnosis not present

## 2021-04-21 DIAGNOSIS — I5022 Chronic systolic (congestive) heart failure: Secondary | ICD-10-CM | POA: Diagnosis not present

## 2021-04-21 DIAGNOSIS — Z8679 Personal history of other diseases of the circulatory system: Secondary | ICD-10-CM

## 2021-04-21 DIAGNOSIS — Z952 Presence of prosthetic heart valve: Secondary | ICD-10-CM

## 2021-04-21 LAB — POCT INR: INR: 3.5 — AB (ref 2.0–3.0)

## 2021-04-21 NOTE — Assessment & Plan Note (Signed)
On exam today, patient had 1+ bilateral lower extremity pitting edema.  Patient has history of systolic heart failure and is not currently on diuretic therapy.  Previously patient has taken furosemide and spironolactone.  Today patient denies orthopnea, shortness of breath, lower extremity edema.  Plan:  -Follow-up in 2 months for reevaluation of volume status to determine if we need to restart furosemide.

## 2021-04-21 NOTE — Progress Notes (Signed)
Anticoagulation Management [name] is a 75 y.o. male who reports to the clinic for monitoring of warfarin treatment.    Indication:  History of atrial flutter; History of mitral valve replacement  Long term current use of warfarin to maintain INR 2.5 - 3.5.  Duration: indefinite Supervising physician:  Reymundo Poll, MD  Anticoagulation Clinic Visit History: Patient does not report signs/symptoms of bleeding or thromboembolism  Other recent changes:  No diet, medications, lifestyle changes endorsed by the patient at this visit with me.  Anticoagulation Episode Summary     Current INR goal:  2.5-3.5  TTR:  62.7 % (8.6 y)  Next INR check:  05/19/2021  INR from last check:  3.5 (04/21/2021)  Weekly max warfarin dose:    Target end date:  Indefinite  INR check location:  Anticoagulation Clinic  Preferred lab:    Send INR reminders to:  ANTICOAG [facility]   Indications   History of atrial flutter [Z86.79] Long term (current) use of anticoagulants (Resolved) [Z79.01] ATRIAL FLUTTER (Resolved) [I48.92] H/O mitral valve replacement with mechanical valve [Z95.2]        Comments:          Anticoagulation Care Providers     Provider Role Specialty Phone number   [name], MD  Cardiology [phone]       No Known Allergies  Current Outpatient Medications:    aspirin EC 81 MG tablet, Take 1 tablet (81 mg total) by mouth daily., Disp: 90 tablet, Rfl: 3   carvedilol (COREG) 6.25 MG tablet, Take 1 tablet (6.25 mg total) by mouth 2 (two) times daily with a meal., Disp: 180 tablet, Rfl: 3   colchicine 0.6 MG tablet, TAKE 1 TABLET BY MOUTH EVERY DAY, Disp: 90 tablet, Rfl: 0   diclofenac Sodium (VOLTAREN) 1 % GEL, APPLY 4 G TOPICALLY 4 TIMES DAILY, Disp: 400 g, Rfl: 11   lisinopril (ZESTRIL) 2.5 MG tablet, TAKE 1 TABLET BY MOUTH EVERY DAY **NEED APPT FOR FURTHER REFILLS**, Disp: 60 tablet, Rfl: 0   tamsulosin (FLOMAX) 0.4 MG CAPS capsule, Take 2 capsules (0.8 mg  total) by mouth daily after breakfast., Disp: 180 capsule, Rfl: 3   warfarin (COUMADIN) 5 MG tablet, Take one tablet by mouth, once-daily--EXCEPT on Thursdays, take ONLY 1/2 tablet on Thursdays., Disp: 78 tablet, Rfl: 1 Past Medical History:  Diagnosis Date   Anemia 01/29/2016   Atrial fibrillation (HCC)    post op.  s/p dc-cv 04/2008. 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  a- Echo 07/2008 Ef 55% mild peroprosthetic MVR with High transmitral gradient (mean 14).  b- normal coronaries by cath 12/2007   BPH (benign prostatic hyperplasia)    Cardiac resynchronization therapy pacemaker (CRT-P) in place 06/22/2011   CHF (congestive heart failure) (HCC)    EF 35-40 % 2011 due to valvular disease and diastolic dysfunction   Chronic back pain    CKD (chronic kidney disease), stage III (HCC)    COVID-19 virus infection 07/24/2020   Tested positive on July 16, 2020   Dysphagia    with normal barium swallow 09/2008   Endocarditis    GERD (gastroesophageal reflux disease)    Heavy alcohol use    history   History of atrial flutter 03/13/2008   History of atrial flutter - resolved.  History of GI bleed    Hypertension    IV drug abuse (HCC)    history of   Lower GI bleed 01/2016   Pacemaker 2010   Social History   Socioeconomic History   Marital status: Divorced    Spouse name: Not on file   Number of children: 9   Years of education: Not on file   Highest education level: Not on file  Occupational History   Occupation: retired    Comment: [employer]  Tobacco Use   Smoking status: Former    Years: 0.00    Types: Cigarettes   Smokeless tobacco: Never  Vaping Use   Vaping Use: Never used  Substance and Sexual Activity   Alcohol use: No    Alcohol/week: 0.0 standard drinks   Drug use: No   Sexual activity: Not Currently  Other  Topics Concern   Not on file  Social History Narrative   Current Social History 02/16/2019        Patient lives with family (baby's mama, daughter and 4 grandkids:  Two yo twins, 11 yo and 65 yo in a home which is 1 story. There are 4 steps with handrails up to the entrance the patient uses.       Patient's method of transportation is via family member (baby's mama).      The highest level of education was high school diploma.      The patient currently retired.      Identified important Relationships are "My Baby's [name], [name]."       Pets : None       Interests / Fun: Sitting outdoors watching the birds and planes."       Exercise riding a bike 5 miles 3 days/week      Current Stressors: The grandkids       Religious / Personal Beliefs: "I believe in God."       L. Ducatte, RN, BSN       Social Determinants of Health   Financial Resource Strain: Not on file  Food Insecurity: Not on file  Transportation Needs: Not on file  Physical Activity: Not on file  Stress: Not on file  Social Connections: Not on file   Family History  Problem Relation Age of Onset   Coronary artery disease Mother    Cancer Father        GI   Cancer Brother        Lung    ASSESSMENT Recent Results: The most recent result is correlated with 35 mg per week: Lab Results  Component Value Date   INR 3.5 (A) 04/21/2021   INR 3.4 (A) 02/10/2021   INR 3.5 (A) 01/13/2021    Anticoagulation Dosing: Description   Take one (1) of your 5mg  peach-colored warfarin tablets by mouth, once-daily at 6PM daily--EXCEPT on MONDAYS and THURSDAYS, take only one-half (1/2) of your tablet on these days.      INR today: Therapeutic  PLAN Weekly dose was decreased by 14% to 30 mg per week  Patient Instructions  Patient instructed to take medications as defined in the Anti-coagulation Track section of this encounter.  Patient instructed to take today's dose.  Patient instructed to take one (1) of your  5mg  peach-colored warfarin tablets by mouth, once-daily at 6PM daily--EXCEPT on MONDAYS and THURSDAYS, take only one-half (1/2) of your tablet on these days.  Patient verbalized understanding of these instructions.   Patient advised to contact clinic or seek  medical attention if signs/symptoms of bleeding or thromboembolism occur.  Patient verbalized understanding by repeating back information and was advised to contact me if further medication-related questions arise. Patient was also provided an information handout.  Follow-up Return in 4 weeks (on 05/19/2021) for Follow up INR.  Elicia Lamp, PharmD, CPP  15 minutes spent face-to-face with the patient during the encounter. 50% of time spent on education, including signs/sx bleeding and clotting, as well as food and drug interactions with warfarin. 50% of time was spent on fingerprick POC INR sample collection,processing, results determination, and documentation in [email or web address].

## 2021-04-21 NOTE — Patient Instructions (Signed)
Thank you, Mr.Akul T Berres for allowing [place] to provide your care today. Today we discussed:  Hernia: Your groin mass is likely a hernia.  We are reassured that the hernia is not causing any pain and that it is reducible.  I will attach some information about hernias for you to review.  If you do start to have symptoms such as pain or if the hernia is not able to reduce back into the abdomen, you can make an appointment to see [place] here urgently or you can go to the emergency room.  When you are doing an activity where you know the hernia will appear, you can use your hand or pillow to brace the area.  Leg Swelling: We will have you come back in 2 months to reassess your lower extremity swelling to determine if we should add back on your water pill (furosemide) for heart failure.  My Chart Access: [email or web address]  Please follow-up in 2 months.  Please make sure to arrive 15 minutes prior to your next appointment. If you arrive late, you may be asked to reschedule.    We look forward to seeing you next time. Please call our clinic at [phone] if you have any questions or concerns. The best time to call is Monday-Friday from 9am-4pm, but there is someone available 24/7. If after hours or the weekend, call the main hospital number and ask for the Internal Medicine Resident On-Call. If you need medication refills, please notify your pharmacy one week in advance and they will send [place] a request.   Thank you for letting us take part in your care. Wishing you the best!  Ellison Carwin, MD 04/21/2021, 4:30 PM IM Resident, PGY-1

## 2021-04-21 NOTE — Patient Instructions (Signed)
Patient instructed to take medications as defined in the Anti-coagulation Track section of this encounter.  Patient instructed to take today's dose.  Patient instructed to take one (1) of your 5mg  peach-colored warfarin tablets by mouth, once-daily at 6PM daily--EXCEPT on MONDAYS and THURSDAYS, take only one-half (1/2) of your tablet on these days.  Patient verbalized understanding of these instructions.

## 2021-04-21 NOTE — Assessment & Plan Note (Addendum)
Patient presents to [facility] with acute complaint of " knot in right groin area".  Patient has noticed knot in his right groin that comes and goes for the last several months.  The past month he has noticed this knot more consistently.  The knot is more noticeable when he is standing, it is nonpainful, he describes it as golf ball sized and hard.  He denies associated symptoms such as urinary symptoms, abdominal pain, nausea vomiting, recent illness, fevers, cough, weight loss, night sweats, chills.   On exam, patient has soft, walnut sized, reducible mass in the right groin area.  Likely this is an inguinal hernia.  Diagnosis of hernia was discussed with the patient.  It is reassuring that the patient has no pain and the mass is reducible at this time.  Patient was counseled that this is common and not dangerous.  We discussed that if patient starts to have pain and is not able to push the hernia back into the abdomen that would prompt him to call [facility] for an appointment or present to the emergency department.  We discussed that some patients who do develop symptoms need surgery for this, though many patients will remain asymptomatic.  Plan: -Patient will monitor for pain, inability to reduce hernia  -Patient information provided about inguinal hernias in AVS

## 2021-04-21 NOTE — Progress Notes (Signed)
CC: "Knot in R groin"   HPI:  [name] is a 75 y.o. male with a past medical history stated below and presents today for evaluation of knot in the right groin area. Please see problem based assessment and plan for additional details.  Past Medical History:  Diagnosis Date   Anemia 01/29/2016   Atrial fibrillation (HCC)    post op.  s/p dc-cv 04/2008. 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  a- Echo 07/2008 Ef 55% mild peroprosthetic MVR with High transmitral gradient (mean 14).  b- normal coronaries by cath 12/2007   BPH (benign prostatic hyperplasia)    Cardiac resynchronization therapy pacemaker (CRT-P) in place 06/22/2011   CHF (congestive heart failure) (HCC)    EF 35-40 % 2011 due to valvular disease and diastolic dysfunction   Chronic back pain    CKD (chronic kidney disease), stage III (HCC)    COVID-19 virus infection 07/24/2020   Tested positive on July 16, 2020   Dysphagia    with normal barium swallow 09/2008   Endocarditis    GERD (gastroesophageal reflux disease)    Heavy alcohol use    history   History of atrial flutter 03/13/2008   History of atrial flutter - resolved.    History of GI bleed    Hypertension    IV drug abuse (HCC)    history of   Lower GI bleed 01/2016   Pacemaker 2010    Current Outpatient Medications on File Prior to Visit  Medication Sig Dispense Refill   aspirin EC 81 MG tablet Take 1 tablet (81 mg total) by mouth daily. 90 tablet 3   carvedilol (COREG) 6.25 MG tablet Take 1 tablet (6.25 mg total) by mouth 2 (two) times daily with a meal. 180 tablet 3   colchicine 0.6 MG tablet TAKE 1 TABLET BY MOUTH EVERY DAY 90 tablet 0   diclofenac Sodium (VOLTAREN) 1 % GEL APPLY 4 G TOPICALLY 4 TIMES DAILY 400 g 11   lisinopril (ZESTRIL) 2.5 MG tablet TAKE 1 TABLET BY MOUTH EVERY DAY **NEED  APPT FOR FURTHER REFILLS** 60 tablet 0   tamsulosin (FLOMAX) 0.4 MG CAPS capsule Take 2 capsules (0.8 mg total) by mouth daily after breakfast. 180 capsule 3   warfarin (COUMADIN) 5 MG tablet Take one tablet by mouth, once-daily--EXCEPT on Thursdays, take ONLY 1/2 tablet on Thursdays. 78 tablet 1   No current facility-administered medications on file prior to visit.    Review of Systems: ROS negative except for what is noted on the assessment and plan.  Vitals:   04/21/21 1532  BP: 129/64  Pulse: 99  Temp: 98.1 F (36.7 C)  TempSrc: Oral  SpO2: 98%  Weight: 184 lb 12.8 oz (83.8 kg)  Height: 5\' 8"  (1.727 m)     Physical Exam: General: Well appearing elderly African-American gentleman, NAD HENT: normocephalic, atraumatic EYES: conjunctiva non-erythematous, no scleral icterus CV: tachycardic, normal rhythm, mechanical valve sound.  1+ bilateral lower extremity pitting edema Pulmonary: Normal work of breathing on RA, lungs clear to auscultation, no rales, wheezes, rhonchi Abdominal: non-distended, soft, non-tender to palpation, normal BS, soft walnut sized reducible inguinal hernia present right groin when patient standing Skin: Warm and dry, no rashes or lesions Neurological: MS: awake, alert and oriented x3, normal speech and fund of  knowledge Motor: moves all extremities antigravity Psych: normal affect    Assessment & Plan:   See Encounters Tab for problem based charting.  Patient seen with Dr. Abundio Miu, M.D. [facility] Internal Medicine, PGY-1 Pager: [phone] Date 04/21/2021 Time 3:53 PM

## 2021-04-22 NOTE — Progress Notes (Signed)
Internal Medicine Clinic Attending  I saw and evaluated the patient.  I personally confirmed the key portions of the history and exam documented by Dr. Sharene Butters and I reviewed pertinent patient test results.  The assessment, diagnosis, and plan were formulated together and I agree with the documentation in the resident's note.  Hernia is above the inguinal ligament just lateral to the scrotum c/w direct hernia.

## 2021-04-28 NOTE — Progress Notes (Signed)
INTERNAL MEDICINE TEACHING ATTENDING ADDENDUM   I agree with pharmacy recommendations as outlined in their note.   Lan Entsminger, MD  

## 2021-05-19 ENCOUNTER — Ambulatory Visit: Payer: 59

## 2021-05-27 ENCOUNTER — Ambulatory Visit (INDEPENDENT_AMBULATORY_CARE_PROVIDER_SITE_OTHER): Payer: 59

## 2021-05-27 DIAGNOSIS — I5022 Chronic systolic (congestive) heart failure: Secondary | ICD-10-CM | POA: Diagnosis not present

## 2021-05-27 LAB — CUP PACEART REMOTE DEVICE CHECK
Battery Remaining Longevity: 59 mo
Battery Remaining Percentage: 66 %
Battery Voltage: 2.98 V
Date Time Interrogation Session: 20221122020012
Implantable Lead Implant Date: 20120905
Implantable Lead Implant Date: 20120905
Implantable Lead Implant Date: 20120905
Implantable Lead Location: 753858
Implantable Lead Location: 753859
Implantable Lead Location: 753860
Implantable Pulse Generator Implant Date: 20190610
Lead Channel Impedance Value: 380 Ohm
Lead Channel Impedance Value: 630 Ohm
Lead Channel Pacing Threshold Amplitude: 1 V
Lead Channel Pacing Threshold Amplitude: 1.5 V
Lead Channel Pacing Threshold Pulse Width: 0.5 ms
Lead Channel Pacing Threshold Pulse Width: 0.6 ms
Lead Channel Sensing Intrinsic Amplitude: 12 mV
Lead Channel Setting Pacing Amplitude: 2 V
Lead Channel Setting Pacing Amplitude: 2.5 V
Lead Channel Setting Pacing Pulse Width: 0.5 ms
Lead Channel Setting Pacing Pulse Width: 0.6 ms
Lead Channel Setting Sensing Sensitivity: 2 mV
Pulse Gen Model: 3222
Pulse Gen Serial Number: 9022287

## 2021-05-28 ENCOUNTER — Other Ambulatory Visit: Payer: Self-pay | Admitting: Internal Medicine

## 2021-05-28 DIAGNOSIS — M10072 Idiopathic gout, left ankle and foot: Secondary | ICD-10-CM

## 2021-06-05 NOTE — Progress Notes (Signed)
Remote pacemaker transmission.   

## 2021-06-27 ENCOUNTER — Observation Stay (HOSPITAL_COMMUNITY)
Admission: EM | Admit: 2021-06-27 | Discharge: 2021-06-30 | Disposition: A | Discharge status: Home / Self Care | Payer: 59 | Attending: Internal Medicine | Admitting: Internal Medicine

## 2021-06-27 ENCOUNTER — Encounter (HOSPITAL_COMMUNITY): Payer: Self-pay | Admitting: Emergency Medicine

## 2021-06-27 ENCOUNTER — Other Ambulatory Visit: Payer: Self-pay

## 2021-06-27 DIAGNOSIS — K922 Gastrointestinal hemorrhage, unspecified: Secondary | ICD-10-CM

## 2021-06-27 DIAGNOSIS — D649 Anemia, unspecified: Secondary | ICD-10-CM | POA: Insufficient documentation

## 2021-06-27 DIAGNOSIS — I4891 Unspecified atrial fibrillation: Secondary | ICD-10-CM | POA: Insufficient documentation

## 2021-06-27 DIAGNOSIS — I4892 Unspecified atrial flutter: Secondary | ICD-10-CM

## 2021-06-27 DIAGNOSIS — Z20822 Contact with and (suspected) exposure to covid-19: Secondary | ICD-10-CM | POA: Insufficient documentation

## 2021-06-27 DIAGNOSIS — Z87891 Personal history of nicotine dependence: Secondary | ICD-10-CM | POA: Diagnosis not present

## 2021-06-27 DIAGNOSIS — R42 Dizziness and giddiness: Secondary | ICD-10-CM | POA: Diagnosis present

## 2021-06-27 DIAGNOSIS — Z7982 Long term (current) use of aspirin: Secondary | ICD-10-CM | POA: Insufficient documentation

## 2021-06-27 DIAGNOSIS — Z79899 Other long term (current) drug therapy: Secondary | ICD-10-CM | POA: Insufficient documentation

## 2021-06-27 DIAGNOSIS — I5032 Chronic diastolic (congestive) heart failure: Secondary | ICD-10-CM | POA: Diagnosis not present

## 2021-06-27 DIAGNOSIS — I130 Hypertensive heart and chronic kidney disease with heart failure and stage 1 through stage 4 chronic kidney disease, or unspecified chronic kidney disease: Secondary | ICD-10-CM | POA: Diagnosis not present

## 2021-06-27 DIAGNOSIS — N183 Chronic kidney disease, stage 3 unspecified: Secondary | ICD-10-CM | POA: Insufficient documentation

## 2021-06-27 HISTORY — DX: Gastrointestinal hemorrhage, unspecified: K92.2

## 2021-06-27 LAB — COMPREHENSIVE METABOLIC PANEL
ALT: 20 U/L (ref 0–44)
AST: 30 U/L (ref 15–41)
Albumin: 3.3 g/dL — ABNORMAL LOW (ref 3.5–5.0)
Alkaline Phosphatase: 35 U/L — ABNORMAL LOW (ref 38–126)
Anion gap: 5 (ref 5–15)
BUN: 31 mg/dL — ABNORMAL HIGH (ref 8–23)
CO2: 26 mmol/L (ref 22–32)
Calcium: 8.4 mg/dL — ABNORMAL LOW (ref 8.9–10.3)
Chloride: 104 mmol/L (ref 98–111)
Creatinine, Ser: 1.53 mg/dL — ABNORMAL HIGH (ref 0.61–1.24)
GFR, Estimated: 47 mL/min — ABNORMAL LOW (ref >60)
Glucose, Bld: 111 mg/dL — ABNORMAL HIGH (ref 70–99)
Potassium: 3.7 mmol/L (ref 3.5–5.1)
Sodium: 135 mmol/L (ref 135–145)
Total Bilirubin: 0.9 mg/dL (ref 0.3–1.2)
Total Protein: 5.6 g/dL — ABNORMAL LOW (ref 6.5–8.1)

## 2021-06-27 LAB — RESP PANEL BY RT-PCR (FLU A&B, COVID) ARPGX2
Influenza A by PCR: NEGATIVE
Influenza B by PCR: NEGATIVE
SARS Coronavirus 2 by RT PCR: NEGATIVE

## 2021-06-27 LAB — PREPARE RBC (CROSSMATCH)

## 2021-06-27 LAB — CBC
HCT: 20.5 % — ABNORMAL LOW (ref 39.0–52.0)
Hemoglobin: 6.3 g/dL — CL (ref 13.0–17.0)
MCH: 28.1 pg (ref 26.0–34.0)
MCHC: 30.7 g/dL (ref 30.0–36.0)
MCV: 91.5 fL (ref 80.0–100.0)
Platelets: 332 10*3/uL (ref 150–400)
RBC: 2.24 MIL/uL — ABNORMAL LOW (ref 4.22–5.81)
RDW: 17.1 % — ABNORMAL HIGH (ref 11.5–15.5)
WBC: 5.4 10*3/uL (ref 4.0–10.5)
nRBC: 0.4 % — ABNORMAL HIGH (ref 0.0–0.2)

## 2021-06-27 MED ORDER — SODIUM CHLORIDE 0.9 % IV BOLUS
1000.0000 mL | Freq: Once | INTRAVENOUS | Status: AC
  Administered 2021-06-27: 22:00:00 1000 mL via INTRAVENOUS

## 2021-06-27 MED ORDER — ONDANSETRON HCL 4 MG/2ML IJ SOLN: 4.0000 mg | Freq: Four times a day (QID) | INTRAMUSCULAR | Status: DC | PRN

## 2021-06-27 MED ORDER — PANTOPRAZOLE INFUSION (NEW) - SIMPLE MED
8.0000 mg/h | INTRAVENOUS | Status: DC
  Administered 2021-06-27 – 2021-06-29 (×4): 8 mg/h via INTRAVENOUS
  Filled 2021-06-27: qty 80
  Filled 2021-06-27: qty 100
  Filled 2021-06-27 (×3): qty 80
  Filled 2021-06-27: qty 100

## 2021-06-27 MED ORDER — SODIUM CHLORIDE 0.9 % IV SOLN
10.0000 mL/h | Freq: Once | INTRAVENOUS | Status: AC
  Administered 2021-06-27: 23:00:00 10 mL/h via INTRAVENOUS

## 2021-06-27 MED ORDER — PANTOPRAZOLE 80MG IVPB - SIMPLE MED
80.0000 mg | Freq: Once | INTRAVENOUS | Status: AC
  Administered 2021-06-27: 21:00:00 80 mg via INTRAVENOUS
  Filled 2021-06-27: qty 80

## 2021-06-27 MED ORDER — ACETAMINOPHEN 325 MG PO TABS
650.0000 mg | ORAL_TABLET | Freq: Four times a day (QID) | ORAL | Status: DC | PRN
  Administered 2021-06-29: 21:00:00 650 mg via ORAL
  Filled 2021-06-27: qty 2

## 2021-06-27 MED ORDER — ONDANSETRON HCL 4 MG PO TABS: 4.0000 mg | ORAL_TABLET | Freq: Four times a day (QID) | ORAL | Status: DC | PRN

## 2021-06-27 MED ORDER — ACETAMINOPHEN 650 MG RE SUPP: 650.0000 mg | Freq: Four times a day (QID) | RECTAL | Status: DC | PRN

## 2021-06-27 MED ORDER — VITAMIN K1 10 MG/ML IJ SOLN
10.0000 mg | Freq: Once | INTRAVENOUS | Status: AC
  Administered 2021-06-27: 21:00:00 10 mg via INTRAVENOUS
  Filled 2021-06-27: qty 1

## 2021-06-27 MED ORDER — SENNOSIDES-DOCUSATE SODIUM 8.6-50 MG PO TABS: 1.0000 | ORAL_TABLET | Freq: Every evening | ORAL | Status: DC | PRN

## 2021-06-27 MED ORDER — TAMSULOSIN HCL 0.4 MG PO CAPS
0.8000 mg | ORAL_CAPSULE | Freq: Every day | ORAL | Status: DC
  Administered 2021-06-28 – 2021-06-30 (×3): 0.8 mg via ORAL
  Filled 2021-06-27 (×3): qty 2

## 2021-06-27 NOTE — ED Triage Notes (Signed)
Pt here for weakness and fatigue since Tuesday. Pt states he came today because he was getting dressed and felt really dizzy like he was going to pass out. Pt reports he has had dark stools for at least 3 days, is on Coumadin. No hx of GL bleed.

## 2021-06-27 NOTE — H&P (Addendum)
Date: 06/27/2021               Patient Name:  [name] MRN: [id number]  DOB: 1945/08/02 Age / Sex: 75 y.o., male   PCP: [name], MD         Medical Service: Internal Medicine Teaching Service         Attending Physician: Dr. Rise Patience, MD    First Contact: Dr. Raymondo Band Pager: [phone]  Second Contact: Dr. Lisabeth Devoid Pager: [phone]       After Hours (After 5p/  First Contact Pager: [phone]  weekends / holidays): Second Contact Pager: [phone]   Chief Complaint: GI bleed  History of Present Illness: [name] is a 75 y.o. male with a PMHx of A fib on coumadin, systolic HF, HTN, BPH, and CKD stage III who presented to the ED today with compliants of dark stools, fatigue, and weakness.  The patient states that he has been having darker colored stools for the past week.  Also complains of new onset weakness and dizziness for the past few days.  His dizziness is exacerbated by bending forward.  He is on Coumadin for A. fib, and he is followed by Dr. Elie Confer in our clinic.  The patient states that he has missed his last 2 appointments for his Coumadin checks, however.  Denies dysuria, hematuria, abdominal pain, numbness, or tingling.  Endorses a 1 month history of chest pain with exertion, denies any chest pain at rest.  No other complaints or concerns.  Meds:  Current Meds  Medication Sig   acetaminophen (TYLENOL) 500 MG tablet Take 1,000 mg by mouth every 6 (six) hours as needed (pain).   aspirin EC 81 MG tablet Take 1 tablet (81 mg total) by mouth daily.   carvedilol (COREG) 6.25 MG tablet Take 1 tablet (6.25 mg total) by mouth 2 (two) times daily with a meal.   colchicine 0.6 MG tablet TAKE 1 TABLET BY MOUTH EVERY DAY (Patient taking differently: Take 0.6 mg by mouth every morning.)   lisinopril (ZESTRIL) 2.5 MG tablet TAKE 1 TABLET BY MOUTH EVERY DAY **NEED APPT FOR FURTHER REFILLS** (Patient taking differently: Take 2.5 mg by mouth every morning.)   tamsulosin  (FLOMAX) 0.4 MG CAPS capsule Take 2 capsules (0.8 mg total) by mouth daily after breakfast.   warfarin (COUMADIN) 5 MG tablet Take one tablet by mouth, once-daily--EXCEPT on Thursdays, take ONLY 1/2 tablet on Thursdays. (Patient taking differently: Take 2.5-5 mg by mouth See admin instructions. Take 1/2 tablet (2.5 mg) by mouth on Thursday mornings, take 1 tablet (5 mg) on all other days in the morning)   Allergies: Allergies as of 06/27/2021   (No Known Allergies)   Past Medical History:  Diagnosis Date   Anemia 01/29/2016   Atrial fibrillation ([place])    post op.  s/p dc-cv 04/2008. 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  a- Echo 07/2008 Ef 55% mild peroprosthetic MVR with High transmitral gradient (mean 14).  b- normal coronaries by cath 12/2007   BPH (benign prostatic hyperplasia)    Cardiac resynchronization therapy pacemaker (CRT-P) in place 06/22/2011   CHF (congestive heart failure) (HCC)    EF 35-40 % 2011 due to valvular disease and diastolic dysfunction   Chronic back pain  CKD (chronic kidney disease), stage III ([place])    COVID-19 virus infection 07/24/2020   Tested positive on July 16, 2020   Dysphagia    with normal barium swallow 09/2008   Endocarditis    GERD (gastroesophageal reflux disease)    Heavy alcohol use    history   History of atrial flutter 03/13/2008   History of atrial flutter - resolved.    History of GI bleed    Hypertension    IV drug abuse ([place])    history of   Lower GI bleed 01/2016   Pacemaker 2010   Family History:  Family History  Problem Relation Age of Onset   Coronary artery disease Mother    Cancer Father        GI   Cancer Brother        Lung   Social History: Denies tobacco, alcohol, or other drug use. Able to perform his ADLs independently.  Review of Systems: A complete ROS was  negative except as per HPI.   Physical Exam: Blood pressure 107/60, pulse 79, temperature 97.6 F (36.4 C), temperature source Oral, resp. rate (!) 23, SpO2 100 %. Physical Exam Constitutional:      General: He is not in acute distress.    Appearance: Normal appearance.  HENT:     Head: Normocephalic and atraumatic.     Mouth/Throat:     Mouth: Mucous membranes are dry.  Cardiovascular:     Rate and Rhythm: Normal rate. Rhythm irregular.     Heart sounds: No murmur heard.   No friction rub. No gallop.     Comments: Mechanical valve sound Pulmonary:     Effort: Pulmonary effort is normal.     Breath sounds: Normal breath sounds. No wheezing, rhonchi or rales.  Abdominal:     General: Abdomen is flat. Bowel sounds are normal. There is no distension.     Palpations: Abdomen is soft.     Tenderness: There is no abdominal tenderness.  Musculoskeletal:        General: Normal range of motion.     Right lower leg: No edema.     Left lower leg: No edema.  Skin:    General: Skin is warm and dry.  Neurological:     General: No focal deficit present.     Mental Status: He is alert and oriented to person, place, and time. Mental status is at baseline.  Psychiatric:        Mood and Affect: Mood normal.        Behavior: Behavior normal.   EKG: personally reviewed my interpretation is atrial fibrillation with occasional PVCs.   Assessment & Plan by Problem: Principal Problem:   Acute GI bleeding  #Acute GI bleed #Atrial fibrillation on Coumadin The patient presents with a 1 week history of dark stools and notes that he has missed his last 2 appointments for Coumadin checks. Patient's last colonoscopy was in 2015 which was normal, aside from diverticula. 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 On arrival, patient with some soft blood pressures in the [id number] systolic, but otherwise hemodynamically stable.   Labs notable for hemoglobin 6.3, PT 69.4, INR 8.4; patient was given 2 units of blood in the ED along with 10 mg of vitamin K.  He was also subsequently started on Protonix.  The ED provider has spoken with the GI team who will evaluate the patient in  the morning but do not anticipate that the patient will need a procedure.  We will continue to follow patient's blood counts and INR after completing blood transfusions and Vit K.  -GI on board, appreciate their recommendations -NPO -2 units PRBCs -IV NS @ 10 mL/hour -IV Protonix at 10 mL/hour -IV vitamin K 10 mg, holding Coumadin -FOBT pending -Trend CBC, INR -Telemetry  #CKD stage III GFR 47, creatinine 1.53, up from 1.38 six months ago.  -Trend BMP  Dispo: Admit patient to Observation with expected length of stay less than 2 midnights.  Signed: Andrey Campanile, MD 06/27/2021, 8:54 PM  Pager: [phone] After 5pm on weekdays and 1pm on weekends: On Call pager: [phone]

## 2021-06-27 NOTE — ED Provider Notes (Signed)
[facility] EMERGENCY DEPARTMENT Provider Note   CSN: [id number] Arrival date & time: 06/27/21  1715     History Chief Complaint  Patient presents with   Weakness   Fatigue    [name] is a 75 y.o. male.  Presents to ER chief complaint of dark stools and dizziness.  He had 3 days of dark stools at home with no bright red bleeding noted.  And has been dizzy today.  He went to urgent care was sent to the ER for evaluation.  Patient is on Coumadin for history of atrial fibrillation but does not go to Coumadin clinic to check his levels.  He has been taking his Coumadin regularly otherwise.  No reports of headache or chest pain or abdominal pain.  No fever no cough no vomiting no diarrhea.      Past Medical History:  Diagnosis Date   Anemia 01/29/2016   Atrial fibrillation ([place])    post op.  s/p dc-cv 04/2008. 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  a- Echo 07/2008 Ef 55% mild peroprosthetic MVR with High transmitral gradient (mean 14).  b- normal coronaries by cath 12/2007   BPH (benign prostatic hyperplasia)    Cardiac resynchronization therapy pacemaker (CRT-P) in place 06/22/2011   CHF (congestive heart failure) (HCC)    EF 35-40 % 2011 due to valvular disease and diastolic dysfunction   Chronic back pain    CKD (chronic kidney disease), stage III ([place])    COVID-19 virus infection 07/24/2020   Tested positive on July 16, 2020   Dysphagia    with normal barium swallow 09/2008   Endocarditis    GERD (gastroesophageal reflux disease)    Heavy alcohol use    history   History of atrial flutter 03/13/2008   History of atrial flutter - resolved.    History of GI bleed    Hypertension    IV drug abuse ([place])    history of   Lower GI bleed 01/2016   Pacemaker 2010    Patient Active Problem List    Diagnosis Date Noted   Acute GI bleeding 06/27/2021   GI bleed 06/27/2021   Inguinal hernia, right 04/21/2021   Cough 02/25/2021   CKD stage G3a/A1, GFR 45-59 and albumin creatinine ratio <30 mg/g (HCC) 03/26/2016   Memory impairment 12/24/2015   H/O mitral valve replacement with mechanical valve 11/13/2015   Benign prostatic hyperplasia with nocturia 11/13/2015   Healthcare maintenance 11/13/2015   History of arteriovenous malformation (AVM) [id number]   Chronic systolic heart failure ([place]) 06/02/2011   Essential hypertension 09/09/2008   History of atrial flutter 03/13/2008    Past Surgical History:  Procedure Laterality Date   BIV PACEMAKER GENERATOR CHANGEOUT N/A 12/13/2017   Procedure: BIV PACEMAKER GENERATOR CHANGEOUT;  Surgeon: [name], MD;  Location: [facility];  Service: Cardiovascular;  Laterality: N/A;   CARDIAC VALVE REPLACEMENT     mitral valve, st jude mechanical    COLONOSCOPY N/A 03/26/2014   Procedure: COLONOSCOPY;  Surgeon: Milus Banister, MD;  Location: [facility];  Service: Endoscopy;  Laterality: N/A;   ENTEROSCOPY N/A 03/28/2014   Procedure: ENTEROSCOPY;  Surgeon: Milus Banister, MD;  Location: [facility];  Service: Endoscopy;  Laterality: N/A;   ESOPHAGOGASTRODUODENOSCOPY N/A 03/25/2014  Procedure: ESOPHAGOGASTRODUODENOSCOPY (EGD);  Surgeon: Iva Boop, MD;  Location: [facility] ENDOSCOPY;  Service: Endoscopy;  Laterality: N/A;   GIVENS CAPSULE STUDY N/A 03/26/2014   Procedure: GIVENS CAPSULE STUDY;  Surgeon: Rachael Fee, MD;  Location: [facility] ENDOSCOPY;  Service: Endoscopy;  Laterality: N/A;   PACEMAKER INSERTION     inserted about 4-5 years ago       Family History  Problem Relation Age of Onset   Coronary artery disease Mother    Cancer Father        GI   Cancer Brother        Lung    Social History   Tobacco Use   Smoking status: Former    Years: 0.00    Types: Cigarettes   Smokeless tobacco: Never  Vaping Use   Vaping Use:  Never used  Substance Use Topics   Alcohol use: No    Alcohol/week: 0.0 standard drinks   Drug use: No    Home Medications Prior to Admission medications   Medication Sig Start Date End Date Taking? Authorizing Provider  acetaminophen (TYLENOL) 500 MG tablet Take 1,000 mg by mouth every 6 (six) hours as needed (pain).   Yes [provider]  aspirin EC 81 MG tablet Take 1 tablet (81 mg total) by mouth daily. 12/28/17  Yes Duke Salvia, MD  carvedilol (COREG) 6.25 MG tablet Take 1 tablet (6.25 mg total) by mouth 2 (two) times daily with a meal. 12/16/20  Yes Roylene Reason, MD  colchicine 0.6 MG tablet TAKE 1 TABLET BY MOUTH EVERY DAY Patient taking differently: Take 0.6 mg by mouth every morning. 05/29/21  Yes Dellia Cloud, MD  lisinopril (ZESTRIL) 2.5 MG tablet TAKE 1 TABLET BY MOUTH EVERY DAY **NEED APPT FOR FURTHER REFILLS** Patient taking differently: Take 2.5 mg by mouth every morning. 03/14/21  Yes Bensimhon, Bevelyn Buckles, MD  tamsulosin (FLOMAX) 0.4 MG CAPS capsule Take 2 capsules (0.8 mg total) by mouth daily after breakfast. 12/16/20  Yes Roylene Reason, MD  warfarin (COUMADIN) 5 MG tablet Take one tablet by mouth, once-daily--EXCEPT on Thursdays, take ONLY 1/2 tablet on Thursdays. Patient taking differently: Take 2.5-5 mg by mouth See admin instructions. Take 1/2 tablet (2.5 mg) by mouth on Thursday mornings, take 1 tablet (5 mg) on all other days in the morning 10/07/20  Yes Hulen Luster B, RPH-CPP  diclofenac Sodium (VOLTAREN) 1 % GEL APPLY 4 G TOPICALLY 4 TIMES DAILY Patient not taking: Reported on 06/27/2021 03/12/21   Dellia Cloud, MD    Allergies    Patient has no known allergies.  Review of Systems   Review of Systems  Constitutional:  Negative for fever.  HENT:  Negative for ear pain and sore throat.   Eyes:  Negative for pain.  Respiratory:  Negative for cough.   Cardiovascular:  Negative for chest pain.  Gastrointestinal:  Negative for abdominal pain.   Genitourinary:  Negative for flank pain.  Musculoskeletal:  Negative for back pain.  Skin:  Negative for color change and rash.  Neurological:  Negative for syncope.  All other systems reviewed and are negative.  Physical Exam Updated Vital Signs BP (!) 91/41    Pulse (!) 55    Temp 97.6 F (36.4 C) (Oral)    Resp 18    SpO2 100%   Physical Exam Constitutional:      Appearance: He is well-developed.  HENT:     Head: Normocephalic.     Nose: Nose normal.  Eyes:  Extraocular Movements: Extraocular movements intact.  Cardiovascular:     Rate and Rhythm: Normal rate.  Pulmonary:     Effort: Pulmonary effort is normal.  Genitourinary:    Comments: Dark green/black appearing stool.  Guaiac positive. Skin:    Coloration: Skin is not jaundiced.  Neurological:     Mental Status: He is alert. Mental status is at baseline.    ED Results / Procedures / Treatments   Labs (all labs ordered are listed, but only abnormal results are displayed) Labs Reviewed  COMPREHENSIVE METABOLIC PANEL - Abnormal; Notable for the following components:      Result Value   Glucose, Bld 111 (*)    BUN 31 (*)    Creatinine, Ser 1.53 (*)    Calcium 8.4 (*)    Total Protein 5.6 (*)    Albumin 3.3 (*)    Alkaline Phosphatase 35 (*)    GFR, Estimated 47 (*)    All other components within normal limits  CBC - Abnormal; Notable for the following components:   RBC 2.24 (*)    Hemoglobin 6.3 (*)    HCT 20.5 (*)    RDW 17.1 (*)    nRBC 0.4 (*)    All other components within normal limits  PROTIME-INR - Abnormal; Notable for the following components:   Prothrombin Time 69.4 (*)    INR 8.4 (*)    All other components within normal limits  RESP PANEL BY RT-PCR (FLU A&B, COVID) ARPGX2  POC OCCULT BLOOD, ED  TYPE AND SCREEN  PREPARE RBC (CROSSMATCH)    EKG None  Radiology No results found.  Procedures .Critical Care Performed by: Luna Fuse, MD Authorized by: Luna Fuse, MD    Critical care provider statement:    Critical care time (minutes):  40   Critical care time was exclusive of:  Separately billable procedures and treating other patients and teaching time   Critical care was necessary to treat or prevent imminent or life-threatening deterioration of the following conditions:  Circulatory failure   Medications Ordered in ED Medications  pantoprozole (PROTONIX) 80 mg /NS 100 mL infusion (8 mg/hr Intravenous New Bag/Given 06/27/21 2239)  acetaminophen (TYLENOL) tablet 650 mg (has no administration in time range)    Or  acetaminophen (TYLENOL) suppository 650 mg (has no administration in time range)  senna-docusate (Senokot-S) tablet 1 tablet (has no administration in time range)  ondansetron (ZOFRAN) tablet 4 mg (has no administration in time range)    Or  ondansetron (ZOFRAN) injection 4 mg (has no administration in time range)  tamsulosin (FLOMAX) capsule 0.8 mg (has no administration in time range)  0.9 %  sodium chloride infusion (10 mL/hr Intravenous New Bag/Given 06/27/21 2240)  phytonadione (VITAMIN K) 10 mg in dextrose 5 % 50 mL IVPB (0 mg Intravenous Stopped 06/27/21 2250)  pantoprazole (PROTONIX) 80 mg /NS 100 mL IVPB (0 mg Intravenous Stopped 06/27/21 2129)  sodium chloride 0.9 % bolus 1,000 mL (1,000 mLs Intravenous New Bag/Given 06/27/21 2216)    ED Course  I have reviewed the triage vital signs and the nursing notes.  Pertinent labs & imaging results that were available during my care of the patient were reviewed by me and considered in my medical decision making (see chart for details).    MDM Rules/Calculators/A&P                         Patient hemoglobin appears much lower than prior levels  at 6.3.  INR supratherapeutic 8.4.  Given IV vitamin K.  2 units of blood ordered for transfusion.  Case discussed with GI on-call physician to evaluate the patient.  Patient otherwise admitted to the medical team.    Final Clinical  Impression(s) / ED Diagnoses Final diagnoses:  Gastrointestinal hemorrhage, unspecified gastrointestinal hemorrhage type  Severe anemia    Rx / DC Orders ED Discharge Orders     None        Luna Fuse, MD 06/27/21 2252

## 2021-06-27 NOTE — ED Provider Notes (Addendum)
Emergency Medicine Provider Triage Evaluation Note  [name] , a 75 y.o. male  was evaluated in triage.  Pt complains of weakness, fatigue for 3 days. Also endorses occasional abdominal pain, dark stools. Denies nvd. Denies dysuria, hematuria. No hx GERD, peptic ulcer disease. No chest pain. Some SOB. No syncope. Takes warfarin -- questionable as to for what -- patient thinks he has Afib, or a defibrillator.  Review of Systems  Positive: Weakness, shortness of breath, dark stool Negative: Chest pain, BRBPR, NVD  Physical Exam  BP 108/67 (BP Location: Right Arm)    Pulse 82    Temp 97.9 F (36.6 C) (Oral)    Resp 16    SpO2 94%  Gen:   Awake, no distress   Resp:  Normal effort  MSK:   Moves extremities without difficulty  Other:  Pale mucous membranes, no ttp Abdomen  Medical Decision Making  Medically screening exam initiated at 6:09 PM.  Appropriate orders placed.  [name] was informed that the remainder of the evaluation will be completed by another provider, this initial triage assessment does not replace that evaluation, and the importance of remaining in the ED until their evaluation is complete.  Worry for GI bleed      [place] 06/27/21 1812    Cheryll Cockayne, MD 06/27/21 2013

## 2021-06-27 NOTE — ED Notes (Signed)
Pt stated he accidentally pulled Iv out. Tech controlled bleeding with 2x2 holding pressure until stopped. Pt was cleaned and changed. Pt has no complaints.

## 2021-06-28 DIAGNOSIS — I4892 Unspecified atrial flutter: Secondary | ICD-10-CM

## 2021-06-28 DIAGNOSIS — D649 Anemia, unspecified: Secondary | ICD-10-CM

## 2021-06-28 DIAGNOSIS — R195 Other fecal abnormalities: Secondary | ICD-10-CM

## 2021-06-28 DIAGNOSIS — D62 Acute posthemorrhagic anemia: Secondary | ICD-10-CM | POA: Diagnosis not present

## 2021-06-28 DIAGNOSIS — K922 Gastrointestinal hemorrhage, unspecified: Secondary | ICD-10-CM

## 2021-06-28 DIAGNOSIS — K921 Melena: Secondary | ICD-10-CM | POA: Diagnosis not present

## 2021-06-28 DIAGNOSIS — R791 Abnormal coagulation profile: Secondary | ICD-10-CM

## 2021-06-28 LAB — CBC
HCT: 23.7 % — ABNORMAL LOW (ref 39.0–52.0)
HCT: 24.2 % — ABNORMAL LOW (ref 39.0–52.0)
HCT: 24.4 % — ABNORMAL LOW (ref 39.0–52.0)
Hemoglobin: 7.2 g/dL — ABNORMAL LOW (ref 13.0–17.0)
Hemoglobin: 7.4 g/dL — ABNORMAL LOW (ref 13.0–17.0)
Hemoglobin: 7.6 g/dL — ABNORMAL LOW (ref 13.0–17.0)
MCH: 27.5 pg (ref 26.0–34.0)
MCH: 27.3 pg (ref 26.0–34.0)
MCH: 27.6 pg (ref 26.0–34.0)
MCHC: 30.4 g/dL (ref 30.0–36.0)
MCHC: 30.6 g/dL (ref 30.0–36.0)
MCHC: 31.1 g/dL (ref 30.0–36.0)
MCV: 90.5 fL (ref 80.0–100.0)
MCV: 89.3 fL (ref 80.0–100.0)
MCV: 88.7 fL (ref 80.0–100.0)
Platelets: 271 10*3/uL (ref 150–400)
Platelets: 287 10*3/uL (ref 150–400)
Platelets: 301 10*3/uL (ref 150–400)
RBC: 2.62 MIL/uL — ABNORMAL LOW (ref 4.22–5.81)
RBC: 2.71 MIL/uL — ABNORMAL LOW (ref 4.22–5.81)
RBC: 2.75 MIL/uL — ABNORMAL LOW (ref 4.22–5.81)
RDW: 17.5 % — ABNORMAL HIGH (ref 11.5–15.5)
RDW: 17.6 % — ABNORMAL HIGH (ref 11.5–15.5)
RDW: 17.7 % — ABNORMAL HIGH (ref 11.5–15.5)
WBC: 6.6 10*3/uL (ref 4.0–10.5)
WBC: 7.2 10*3/uL (ref 4.0–10.5)
WBC: 7.9 10*3/uL (ref 4.0–10.5)
nRBC: 0.3 % — ABNORMAL HIGH (ref 0.0–0.2)
nRBC: 0.3 % — ABNORMAL HIGH (ref 0.0–0.2)
nRBC: 0 % (ref 0.0–0.2)

## 2021-06-28 LAB — PROTIME-INR
INR: 8.4 (ref 0.8–1.2)
INR: 2.4 — ABNORMAL HIGH (ref 0.8–1.2)
Prothrombin Time: 69.4 seconds — ABNORMAL HIGH (ref 11.4–15.2)
Prothrombin Time: 25.7 seconds — ABNORMAL HIGH (ref 11.4–15.2)

## 2021-06-28 LAB — BASIC METABOLIC PANEL
Anion gap: 6 (ref 5–15)
BUN: 25 mg/dL — ABNORMAL HIGH (ref 8–23)
CO2: 22 mmol/L (ref 22–32)
Calcium: 8 mg/dL — ABNORMAL LOW (ref 8.9–10.3)
Chloride: 107 mmol/L (ref 98–111)
Creatinine, Ser: 1.32 mg/dL — ABNORMAL HIGH (ref 0.61–1.24)
GFR, Estimated: 56 mL/min — ABNORMAL LOW (ref >60)
Glucose, Bld: 85 mg/dL (ref 70–99)
Potassium: 3.6 mmol/L (ref 3.5–5.1)
Sodium: 135 mmol/L (ref 135–145)

## 2021-06-28 LAB — MRSA NEXT GEN BY PCR, NASAL: MRSA by PCR Next Gen: NOT DETECTED

## 2021-06-28 MED ORDER — SODIUM CHLORIDE 0.9 % IV SOLN: INTRAVENOUS | Status: DC

## 2021-06-28 MED ORDER — CARVEDILOL 6.25 MG PO TABS
6.2500 mg | ORAL_TABLET | Freq: Two times a day (BID) | ORAL | Status: DC
  Administered 2021-06-28 – 2021-06-30 (×5): 6.25 mg via ORAL
  Filled 2021-06-28 (×5): qty 1

## 2021-06-28 MED ORDER — POTASSIUM CHLORIDE CRYS ER 20 MEQ PO TBCR
40.0000 meq | EXTENDED_RELEASE_TABLET | Freq: Once | ORAL | Status: AC
  Administered 2021-06-28: 13:00:00 40 meq via ORAL
  Filled 2021-06-28: qty 2

## 2021-06-28 MED ORDER — ASPIRIN EC 81 MG PO TBEC: 81.0000 mg | DELAYED_RELEASE_TABLET | Freq: Every day | ORAL | Status: DC

## 2021-06-28 MED ORDER — LISINOPRIL 2.5 MG PO TABS
2.5000 mg | ORAL_TABLET | Freq: Every morning | ORAL | Status: DC
  Administered 2021-06-28 – 2021-06-30 (×3): 2.5 mg via ORAL
  Filled 2021-06-28 (×4): qty 1

## 2021-06-28 NOTE — ED Notes (Signed)
Pt tried to get out of bed pulling all of the chords and blood pressure cuff off stating " I cannot have all this on me." Tech advised the pt he must keep all equipment on. Tech assisted pt to tolieting with a urinal Tech helped pt back into bed and hooked pt back up to the monitor. Call light within reach bed locked low in a safe position.

## 2021-06-28 NOTE — ED Notes (Signed)
Patient is resting comfortably second unit of blood complete and pt tolerated well

## 2021-06-28 NOTE — H&P (View-Only) (Signed)
Referring Provider: Dr. Angelica Pou  Primary Care Physician:  [name], MD Primary Gastroenterologist:  Dr. Ardis Hughs 2015  Reason for Consultation:  Anemia, dark stools   HPI: [name] is a 75 y.o. male with a past medical history of atrial fibrillation on Coumadin, 1st degree AV block s/p pacemaker, endocarditis, s/p St. Jude MVR 2009, CHF, nonischemic cardiomyopathy, CKD stage III, BPH, alcohol use disorder (abstinent x 10+ years), polysubstance abuse (abstinent for 10+ years) and GI bleed secondary to small bowel AVMs 2015.   He developed central upper abdominal pain with passing black colored loose stools about 2 weeks ago. He took a few doses of Alka-Seltzer and Pepto-Bismol without relief.  No significant heartburn/indigestion.  No N/V. No bright red rectal bleeding. He developed profound fatigue, generalized weakness yesterday morning and he felt dizzy when getting dressed, felt like he was going to pass out. He presented to [facility] ED 06/27/2021 for further evaluation. Labs in the ED showed a supratherapeutic INR level of 8, Hg level of 6 and elevated BUN 31. He received Vitamin K and he was transfused 2 units of PRBCs. PPI infusion initiated. GI consult was requested for further management and evaluation for UGI bleed.   His last dose of Coumadin was around 8 AM 06/27/2021.  He takes ASA 81 mg daily.  No other NSAIDs.  No chest pain or palpitations.  He felt slightly short of breath yesterday while in the ED which resolved.  No further melena since arriving to the ED.  He has a history of a GI bleed in 2015 which required hospital admission.  A small bowel capsule endoscopy 03/26/2014 showed active bleeding in the proximal small bowel.  A colonoscopy on the same date showed diverticulosis with several flecks of red blood in the colon otherwise was normal.  He underwent a small bowel enteroscopy 03/28/2014 which identified active oozing from the proximal jejunum  thought to be due to a Dielofoy's lesion/AVM and 3 clips were placed with hemostasis.   Small Bowel Enteroscopy 03/28/2014 by Dr. Ardis Hughs: There was active oozing of blood from a pinpoint of otherwise normal appearing mucosa in the proximal jejunum. 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 This was treated with placement of 3 endoclips and hemostasis appeared complete. The examination was otherwise normal  Colonoscopy 03/26/2014: There were a few diverticulum throughout the colon. There were several flecks of recent red blood throughout the colon, including the cecum. Brief inspection of terminal ileum also showed small flecks of blood within. The examination was otherwise normal  Small bowel capsule endoscopy 03/26/2014: Active bleeding proximal small bowel   Past Medical History:  Diagnosis Date   Anemia 01/29/2016   Atrial fibrillation ([place])    post op.  s/p dc-cv 04/2008. 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  a- Echo 07/2008 Ef 55% mild peroprosthetic MVR with High transmitral gradient (mean 14).  b- normal coronaries by cath 12/2007   BPH (benign prostatic hyperplasia)    Cardiac resynchronization therapy pacemaker (CRT-P) in place 06/22/2011   CHF (congestive heart failure) (HCC)    EF 35-40 % 2011 due to valvular disease and diastolic dysfunction   Chronic back pain    CKD (chronic kidney disease), stage III ([place])  COVID-19 virus infection 07/24/2020   Tested positive on July 16, 2020   Dysphagia    with normal barium swallow 09/2008   Endocarditis    GERD (gastroesophageal reflux disease)    Heavy alcohol use    history   History of atrial flutter 03/13/2008   History of atrial flutter - resolved.    History of GI bleed    Hypertension    IV drug abuse ([place])    history of   Lower GI bleed 01/2016    Pacemaker 2010    Past Surgical History:  Procedure Laterality Date   BIV PACEMAKER GENERATOR CHANGEOUT N/A 12/13/2017   Procedure: BIV PACEMAKER GENERATOR CHANGEOUT;  Surgeon: [name], MD;  Location: [facility];  Service: Cardiovascular;  Laterality: N/A;   CARDIAC VALVE REPLACEMENT     mitral valve, st jude mechanical    COLONOSCOPY N/A 03/26/2014   Procedure: COLONOSCOPY;  Surgeon: Milus Banister, MD;  Location: [place];  Service: Endoscopy;  Laterality: N/A;   ENTEROSCOPY N/A 03/28/2014   Procedure: ENTEROSCOPY;  Surgeon: Milus Banister, MD;  Location: [place];  Service: Endoscopy;  Laterality: N/A;   ESOPHAGOGASTRODUODENOSCOPY N/A 03/25/2014   Procedure: ESOPHAGOGASTRODUODENOSCOPY (EGD);  Surgeon: Gatha Mayer, MD;  Location: [facility];  Service: Endoscopy;  Laterality: N/A;   GIVENS CAPSULE STUDY N/A 03/26/2014   Procedure: GIVENS CAPSULE STUDY;  Surgeon: Milus Banister, MD;  Location: [place];  Service: Endoscopy;  Laterality: N/A;   PACEMAKER INSERTION     inserted about 4-5 years ago    Prior to Admission medications   Medication Sig Start Date End Date Taking? Authorizing Provider  acetaminophen (TYLENOL) 500 MG tablet Take 1,000 mg by mouth every 6 (six) hours as needed (pain).   Yes [provider]  aspirin EC 81 MG tablet Take 1 tablet (81 mg total) by mouth daily. 12/28/17  Yes [name], MD  carvedilol (COREG) 6.25 MG tablet Take 1 tablet (6.25 mg total) by mouth 2 (two) times daily with a meal. 12/16/20  Yes Cato Mulligan, MD  colchicine 0.6 MG tablet TAKE 1 TABLET BY MOUTH EVERY DAY Patient taking differently: Take 0.6 mg by mouth every morning. 05/29/21  Yes [name], MD  lisinopril (ZESTRIL) 2.5 MG tablet TAKE 1 TABLET BY MOUTH EVERY DAY **NEED APPT FOR FURTHER REFILLS** Patient taking differently: Take 2.5 mg by mouth every morning. 03/14/21  Yes Bensimhon, Shaune Pascal, MD  tamsulosin (FLOMAX) 0.4 MG CAPS capsule Take  2 capsules (0.8 mg total) by mouth daily after breakfast. 12/16/20  Yes Cato Mulligan, MD  warfarin (COUMADIN) 5 MG tablet Take one tablet by mouth, once-daily--EXCEPT on Thursdays, take ONLY 1/2 tablet on Thursdays. Patient taking differently: Take 2.5-5 mg by mouth See admin instructions. Take 1/2 tablet (2.5 mg) by mouth on Thursday mornings, take 1 tablet (5 mg) on all other days in the morning 10/07/20  Yes Jorene Guest B, RPH-CPP  diclofenac Sodium (VOLTAREN) 1 % GEL APPLY 4 G TOPICALLY 4 TIMES DAILY Patient not taking: Reported on 06/27/2021 03/12/21   [name], MD    Current Facility-Administered Medications  Medication Dose Route Frequency Provider Last Rate Last Admin   0.9 %  sodium chloride infusion   Intravenous Continuous Lacinda Axon, MD       acetaminophen (TYLENOL) tablet 650 mg  650 mg Oral Q6H PRN Lacinda Axon, MD       Or   acetaminophen (TYLENOL) suppository 650 mg  650 mg Rectal Q6H PRN Lacinda Axon, MD       ondansetron [facility]) tablet 4 mg  4 mg Oral Q6H PRN Lacinda Axon, MD       Or   ondansetron [facility]) injection 4 mg  4 mg Intravenous Q6H PRN Lacinda Axon, MD       pantoprozole (PROTONIX) 80 mg /NS 100 mL infusion  8 mg/hr Intravenous Continuous Lacinda Axon, MD 10 mL/hr at 06/27/21 2239 8 mg/hr at 06/27/21 2239   senna-docusate (Senokot-S) tablet 1 tablet  1 tablet Oral QHS PRN Lacinda Axon, MD       tamsulosin (FLOMAX) capsule 0.8 mg  0.8 mg Oral QPC breakfast Lacinda Axon, MD       Current Outpatient Medications  Medication Sig Dispense Refill   acetaminophen (TYLENOL) 500 MG tablet Take 1,000 mg by mouth every 6 (six) hours as needed (pain).     aspirin EC 81 MG tablet Take 1 tablet (81 mg total) by mouth daily. 90 tablet 3   carvedilol (COREG) 6.25 MG tablet Take 1 tablet (6.25 mg total) by mouth 2 (two) times daily with a meal. 180 tablet 3   colchicine 0.6 MG tablet TAKE 1 TABLET BY MOUTH EVERY DAY  (Patient taking differently: Take 0.6 mg by mouth every morning.) 90 tablet 0   lisinopril (ZESTRIL) 2.5 MG tablet TAKE 1 TABLET BY MOUTH EVERY DAY **NEED APPT FOR FURTHER REFILLS** (Patient taking differently: Take 2.5 mg by mouth every morning.) 60 tablet 0   tamsulosin (FLOMAX) 0.4 MG CAPS capsule Take 2 capsules (0.8 mg total) by mouth daily after breakfast. 180 capsule 3   warfarin (COUMADIN) 5 MG tablet Take one tablet by mouth, once-daily--EXCEPT on Thursdays, take ONLY 1/2 tablet on Thursdays. (Patient taking differently: Take 2.5-5 mg by mouth See admin instructions. Take 1/2 tablet (2.5 mg) by mouth on Thursday mornings, take 1 tablet (5 mg) on all other days in the morning) 78 tablet 1   diclofenac Sodium (VOLTAREN) 1 % GEL APPLY 4 G TOPICALLY 4 TIMES DAILY (Patient not taking: Reported on 06/27/2021) 400 g 11    Allergies as of 06/27/2021   (No Known Allergies)    Family History  Problem Relation Age of Onset   Coronary artery disease Mother    Cancer Father        GI   Cancer Brother        Lung    Social History   Socioeconomic History   Marital status: Divorced    Spouse name: Not on file   Number of children: 9   Years of education: Not on file   Highest education level: Not on file  Occupational History   Occupation: retired    Comment: [employer]  Tobacco Use   Smoking status: Former    Years: 0.00    Types: Cigarettes   Smokeless tobacco: Never  Vaping Use   Vaping Use: Never used  Substance and Sexual Activity   Alcohol use: No    Alcohol/week: 0.0 standard drinks   Drug use: No   Sexual activity: Not Currently  Other Topics Concern   Not on file  Social History Narrative   Current Social History 02/16/2019        Patient lives with family (baby's 41, daughter and 4 grandkids:  75 yo twins, 59 yo and 48 yo in a home which is 1 story. There are 4 steps with handrails up to the entrance the  patient uses.       Patient's method of transportation is  via family member (baby's mama).      The highest level of education was high school diploma.      The patient currently retired.      Identified important Relationships are "My Baby's [name], [name]."       Pets : None       Interests / Fun: Sitting outdoors watching the birds and planes."       Exercise riding a bike 5 miles 3 days/week      Current Stressors: The grandkids       Religious / Personal Beliefs: "I believe in God."       L. Ducatte, RN, BSN       Social Determinants of Health   Financial Resource Strain: Not on file  Food Insecurity: Not on file  Transportation Needs: Not on file  Physical Activity: Not on file  Stress: Not on file  Social Connections: Not on file  Intimate Partner Violence: Not on file    Review of Systems: Gen: Denies fever, sweats or chills. No weight loss.  CV: Denies chest pain, palpitations or edema. Resp: Denies cough, shortness of breath of hemoptysis.  GI: See HPI. GU : Denies urinary burning, blood in urine, increased urinary frequency or incontinence. MS: Denies joint pain, muscles aches or weakness. Derm: Denies rash, itchiness, skin lesions or unhealing ulcers. Psych: Mild memory issues. Denies depression or anxiety. Heme: Denies easy bruising, bleeding. Neuro:  + dizziness yesterday without recurrence.  Endo:  Denies any problems with DM, thyroid or adrenal function.  Physical Exam: Vital signs in last 24 hours: Temp:  [97.6 F (36.4 C)-98.3 F (36.8 C)] 98.3 F (36.8 C) (12/24 0135) Pulse Rate:  [55-103] 74 (12/24 0530) Resp:  [15-31] 29 (12/24 0530) BP: (91-116)/(41-87) 102/51 (12/24 0530) SpO2:  [92 %-100 %] 100 % (12/24 0530)   General:  Alert 75 year old male in no acute distress. Head:  Normocephalic and atraumatic. Eyes:  No scleral icterus. Conjunctiva pink. Ears:  Normal auditory acuity. Nose:  No deformity, discharge or lesions. Mouth: Poor dentition.  No ulcers or lesions.  Neck:  Supple. No  lymphadenopathy or thyromegaly.  Lungs: Breath sounds clear throughout. Heart: Regular rhythm, no murmur. Abdomen: Soft, nondistended.  Nontender.  Positive bowel sounds to all 4 quadrants.  No HSM. Rectal: Deferred. Musculoskeletal:  Symmetrical without gross deformities.  Pulses:  Normal pulses noted. Extremities:  Without clubbing or edema. Neurologic:  Alert and  oriented x4. No focal deficits.  Skin:  Intact without significant lesions or rashes. Psych:  Alert and cooperative. Normal mood and affect.  Intake/Output from previous day: 12/23 0701 - 12/24 0700 In: 1792.2 [I.V.:1000; Blood:642.5; IV Piggyback:149.7] Out: 1000 [Urine:1000] Intake/Output this shift: Total I/O In: 1792.2 [I.V.:1000; Blood:642.5; IV Piggyback:149.7] Out: 1000 [Urine:1000]  Lab Results: Recent Labs    06/27/21 1805  WBC 5.4  HGB 6.3*  HCT 20.5*  PLT 332   BMET Recent Labs    06/27/21 1805  NA 135  K 3.7  CL 104  CO2 26  GLUCOSE 111*  BUN 31*  CREATININE 1.53*  CALCIUM 8.4*   LFT Recent Labs    06/27/21 1805  PROT 5.6*  ALBUMIN 3.3*  AST 30  ALT 20  ALKPHOS 35*  BILITOT 0.9   PT/INR Recent Labs    06/27/21 1805  LABPROT 69.4*  INR 8.4*   Hepatitis Panel No results for input(s): HEPBSAG, HCVAB,  HEPAIGM, HEPBIGM in the last 72 hours.    Studies/Results: No results found.  IMPRESSION/PLAN:  76) 75 year old male with a history of a GI bleed secondary to small bowel AVMs 2015 who was admitted to the hospital 06/27/2021 epigastric pain, black stools x 2 week with associated anemia. On coumadin for afib/past MVR). Admission Hg level 6. Transfused 2 units of PRBCs. Post transfusion Hg 7.2.  No further melena since arriving to the ED -H/H Q 6 hrs x 24 hrs  -Transfuse for Hg < 7 -Clear liquid diet -Small bowel enteroscopy when INR < 2, tentatively schedule for tomorrow. Enteroscopy benefits and risks discussed including risk with sedation, risk of bleeding, perforation and  infection  -Continue PPI infusion  -Continue to monitor patient closely for active GI bleeding   2) Atrial fibrillation on Coumadin.  Admission INR 8.4 -> received Vitamin K 10mg  IV x 1 -> INR 2.4. Last dose of Coumadin was am 12/23. -Continue to hold Coumadin  -INR in am  3) CHF. Nonischemic cardiomyopathy. S/P mechanical MVR, ECHO 08/01/2019 with LV EF 35 to 40%  4) Past pacemaker placement for bradycardia      Noralyn Pick  06/28/2021, 06:05 [id number] AM

## 2021-06-28 NOTE — Consult Note (Signed)
Referring Provider: Dr. Angelica Pou  Primary Care Physician:  [name], MD Primary Gastroenterologist:  Dr. Ardis Hughs 2015  Reason for Consultation:  Anemia, dark stools   HPI: [name] is a 75 y.o. male with a past medical history of atrial fibrillation on Coumadin, 1st degree AV block s/p pacemaker, endocarditis, s/p St. Jude MVR 2009, CHF, nonischemic cardiomyopathy, CKD stage III, BPH, alcohol use disorder (abstinent x 10+ years), polysubstance abuse (abstinent for 10+ years) and GI bleed secondary to small bowel AVMs 2015.   He developed central upper abdominal pain with passing black colored loose stools about 2 weeks ago. He took a few doses of Alka-Seltzer and Pepto-Bismol without relief.  No significant heartburn/indigestion.  No N/V. No bright red rectal bleeding. He developed profound fatigue, generalized weakness yesterday morning and he felt dizzy when getting dressed, felt like he was going to pass out. He presented to [facility] ED 06/27/2021 for further evaluation. Labs in the ED showed a supratherapeutic INR level of 8, Hg level of 6 and elevated BUN 31. He received Vitamin K and he was transfused 2 units of PRBCs. PPI infusion initiated. GI consult was requested for further management and evaluation for UGI bleed.   His last dose of Coumadin was around 8 AM 06/27/2021.  He takes ASA 81 mg daily.  No other NSAIDs.  No chest pain or palpitations.  He felt slightly short of breath yesterday while in the ED which resolved.  No further melena since arriving to the ED.  He has a history of a GI bleed in 2015 which required hospital admission.  A small bowel capsule endoscopy 03/26/2014 showed active bleeding in the proximal small bowel.  A colonoscopy on the same date showed diverticulosis with several flecks of red blood in the colon otherwise was normal.  He underwent a small bowel enteroscopy 03/28/2014 which identified active oozing from the proximal jejunum  thought to be due to a Dielofoy's lesion/AVM and 3 clips were placed with hemostasis.   Small Bowel Enteroscopy 03/28/2014 by Dr. Ardis Hughs: There was active oozing of blood from a pinpoint of otherwise normal appearing mucosa in the proximal jejunum. 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 This was treated with placement of 3 endoclips and hemostasis appeared complete. The examination was otherwise normal  Colonoscopy 03/26/2014: There were a few diverticulum throughout the colon. There were several flecks of recent red blood throughout the colon, including the cecum. Brief inspection of terminal ileum also showed small flecks of blood within. The examination was otherwise normal  Small bowel capsule endoscopy 03/26/2014: Active bleeding proximal small bowel   Past Medical History:  Diagnosis Date   Anemia 01/29/2016   Atrial fibrillation ([place])    post op.  s/p dc-cv 04/2008. 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  a- Echo 07/2008 Ef 55% mild peroprosthetic MVR with High transmitral gradient (mean 14).  b- normal coronaries by cath 12/2007   BPH (benign prostatic hyperplasia)    Cardiac resynchronization therapy pacemaker (CRT-P) in place 06/22/2011   CHF (congestive heart failure) (HCC)    EF 35-40 % 2011 due to valvular disease and diastolic dysfunction   Chronic back pain    CKD (chronic kidney disease), stage III ([place])  COVID-19 virus infection 07/24/2020   Tested positive on July 16, 2020   Dysphagia    with normal barium swallow 09/2008   Endocarditis    GERD (gastroesophageal reflux disease)    Heavy alcohol use    history   History of atrial flutter 03/13/2008   History of atrial flutter - resolved.    History of GI bleed    Hypertension    IV drug abuse ([place])    history of   Lower GI bleed 01/2016    Pacemaker 2010    Past Surgical History:  Procedure Laterality Date   BIV PACEMAKER GENERATOR CHANGEOUT N/A 12/13/2017   Procedure: BIV PACEMAKER GENERATOR CHANGEOUT;  Surgeon: [name], MD;  Location: [facility];  Service: Cardiovascular;  Laterality: N/A;   CARDIAC VALVE REPLACEMENT     mitral valve, st jude mechanical    COLONOSCOPY N/A 03/26/2014   Procedure: COLONOSCOPY;  Surgeon: Milus Banister, MD;  Location: [place];  Service: Endoscopy;  Laterality: N/A;   ENTEROSCOPY N/A 03/28/2014   Procedure: ENTEROSCOPY;  Surgeon: Milus Banister, MD;  Location: [place];  Service: Endoscopy;  Laterality: N/A;   ESOPHAGOGASTRODUODENOSCOPY N/A 03/25/2014   Procedure: ESOPHAGOGASTRODUODENOSCOPY (EGD);  Surgeon: Gatha Mayer, MD;  Location: [facility];  Service: Endoscopy;  Laterality: N/A;   GIVENS CAPSULE STUDY N/A 03/26/2014   Procedure: GIVENS CAPSULE STUDY;  Surgeon: Milus Banister, MD;  Location: [place];  Service: Endoscopy;  Laterality: N/A;   PACEMAKER INSERTION     inserted about 4-5 years ago    Prior to Admission medications   Medication Sig Start Date End Date Taking? Authorizing Provider  acetaminophen (TYLENOL) 500 MG tablet Take 1,000 mg by mouth every 6 (six) hours as needed (pain).   Yes [provider]  aspirin EC 81 MG tablet Take 1 tablet (81 mg total) by mouth daily. 12/28/17  Yes [name], MD  carvedilol (COREG) 6.25 MG tablet Take 1 tablet (6.25 mg total) by mouth 2 (two) times daily with a meal. 12/16/20  Yes Cato Mulligan, MD  colchicine 0.6 MG tablet TAKE 1 TABLET BY MOUTH EVERY DAY Patient taking differently: Take 0.6 mg by mouth every morning. 05/29/21  Yes [name], MD  lisinopril (ZESTRIL) 2.5 MG tablet TAKE 1 TABLET BY MOUTH EVERY DAY **NEED APPT FOR FURTHER REFILLS** Patient taking differently: Take 2.5 mg by mouth every morning. 03/14/21  Yes Bensimhon, Shaune Pascal, MD  tamsulosin (FLOMAX) 0.4 MG CAPS capsule Take  2 capsules (0.8 mg total) by mouth daily after breakfast. 12/16/20  Yes Cato Mulligan, MD  warfarin (COUMADIN) 5 MG tablet Take one tablet by mouth, once-daily--EXCEPT on Thursdays, take ONLY 1/2 tablet on Thursdays. Patient taking differently: Take 2.5-5 mg by mouth See admin instructions. Take 1/2 tablet (2.5 mg) by mouth on Thursday mornings, take 1 tablet (5 mg) on all other days in the morning 10/07/20  Yes Jorene Guest B, RPH-CPP  diclofenac Sodium (VOLTAREN) 1 % GEL APPLY 4 G TOPICALLY 4 TIMES DAILY Patient not taking: Reported on 06/27/2021 03/12/21   [name], MD    Current Facility-Administered Medications  Medication Dose Route Frequency Provider Last Rate Last Admin   0.9 %  sodium chloride infusion   Intravenous Continuous Lacinda Axon, MD       acetaminophen (TYLENOL) tablet 650 mg  650 mg Oral Q6H PRN Lacinda Axon, MD       Or   acetaminophen (TYLENOL) suppository 650 mg  650 mg Rectal Q6H PRN Lacinda Axon, MD       ondansetron [facility]) tablet 4 mg  4 mg Oral Q6H PRN Lacinda Axon, MD       Or   ondansetron [facility]) injection 4 mg  4 mg Intravenous Q6H PRN Lacinda Axon, MD       pantoprozole (PROTONIX) 80 mg /NS 100 mL infusion  8 mg/hr Intravenous Continuous Lacinda Axon, MD 10 mL/hr at 06/27/21 2239 8 mg/hr at 06/27/21 2239   senna-docusate (Senokot-S) tablet 1 tablet  1 tablet Oral QHS PRN Lacinda Axon, MD       tamsulosin (FLOMAX) capsule 0.8 mg  0.8 mg Oral QPC breakfast Lacinda Axon, MD       Current Outpatient Medications  Medication Sig Dispense Refill   acetaminophen (TYLENOL) 500 MG tablet Take 1,000 mg by mouth every 6 (six) hours as needed (pain).     aspirin EC 81 MG tablet Take 1 tablet (81 mg total) by mouth daily. 90 tablet 3   carvedilol (COREG) 6.25 MG tablet Take 1 tablet (6.25 mg total) by mouth 2 (two) times daily with a meal. 180 tablet 3   colchicine 0.6 MG tablet TAKE 1 TABLET BY MOUTH EVERY DAY  (Patient taking differently: Take 0.6 mg by mouth every morning.) 90 tablet 0   lisinopril (ZESTRIL) 2.5 MG tablet TAKE 1 TABLET BY MOUTH EVERY DAY **NEED APPT FOR FURTHER REFILLS** (Patient taking differently: Take 2.5 mg by mouth every morning.) 60 tablet 0   tamsulosin (FLOMAX) 0.4 MG CAPS capsule Take 2 capsules (0.8 mg total) by mouth daily after breakfast. 180 capsule 3   warfarin (COUMADIN) 5 MG tablet Take one tablet by mouth, once-daily--EXCEPT on Thursdays, take ONLY 1/2 tablet on Thursdays. (Patient taking differently: Take 2.5-5 mg by mouth See admin instructions. Take 1/2 tablet (2.5 mg) by mouth on Thursday mornings, take 1 tablet (5 mg) on all other days in the morning) 78 tablet 1   diclofenac Sodium (VOLTAREN) 1 % GEL APPLY 4 G TOPICALLY 4 TIMES DAILY (Patient not taking: Reported on 06/27/2021) 400 g 11    Allergies as of 06/27/2021   (No Known Allergies)    Family History  Problem Relation Age of Onset   Coronary artery disease Mother    Cancer Father        GI   Cancer Brother        Lung    Social History   Socioeconomic History   Marital status: Divorced    Spouse name: Not on file   Number of children: 9   Years of education: Not on file   Highest education level: Not on file  Occupational History   Occupation: retired    Comment: [employer]  Tobacco Use   Smoking status: Former    Years: 0.00    Types: Cigarettes   Smokeless tobacco: Never  Vaping Use   Vaping Use: Never used  Substance and Sexual Activity   Alcohol use: No    Alcohol/week: 0.0 standard drinks   Drug use: No   Sexual activity: Not Currently  Other Topics Concern   Not on file  Social History Narrative   Current Social History 02/16/2019        Patient lives with family (baby's 67, daughter and 4 grandkids:  57 yo twins, 62 yo and 56 yo in a home which is 1 story. There are 4 steps with handrails up to the entrance the  patient uses.       Patient's method of transportation is  via family member (baby's mama).      The highest level of education was high school diploma.      The patient currently retired.      Identified important Relationships are "My Baby's [name], [name]."       Pets : None       Interests / Fun: Sitting outdoors watching the birds and planes."       Exercise riding a bike 5 miles 3 days/week      Current Stressors: The grandkids       Religious / Personal Beliefs: "I believe in God."       L. Ducatte, RN, BSN       Social Determinants of Health   Financial Resource Strain: Not on file  Food Insecurity: Not on file  Transportation Needs: Not on file  Physical Activity: Not on file  Stress: Not on file  Social Connections: Not on file  Intimate Partner Violence: Not on file    Review of Systems: Gen: Denies fever, sweats or chills. No weight loss.  CV: Denies chest pain, palpitations or edema. Resp: Denies cough, shortness of breath of hemoptysis.  GI: See HPI. GU : Denies urinary burning, blood in urine, increased urinary frequency or incontinence. MS: Denies joint pain, muscles aches or weakness. Derm: Denies rash, itchiness, skin lesions or unhealing ulcers. Psych: Mild memory issues. Denies depression or anxiety. Heme: Denies easy bruising, bleeding. Neuro:  + dizziness yesterday without recurrence.  Endo:  Denies any problems with DM, thyroid or adrenal function.  Physical Exam: Vital signs in last 24 hours: Temp:  [97.6 F (36.4 C)-98.3 F (36.8 C)] 98.3 F (36.8 C) (12/24 0135) Pulse Rate:  [55-103] 74 (12/24 0530) Resp:  [15-31] 29 (12/24 0530) BP: (91-116)/(41-87) 102/51 (12/24 0530) SpO2:  [92 %-100 %] 100 % (12/24 0530)   General:  Alert 75 year old male in no acute distress. Head:  Normocephalic and atraumatic. Eyes:  No scleral icterus. Conjunctiva pink. Ears:  Normal auditory acuity. Nose:  No deformity, discharge or lesions. Mouth: Poor dentition.  No ulcers or lesions.  Neck:  Supple. No  lymphadenopathy or thyromegaly.  Lungs: Breath sounds clear throughout. Heart: Regular rhythm, no murmur. Abdomen: Soft, nondistended.  Nontender.  Positive bowel sounds to all 4 quadrants.  No HSM. Rectal: Deferred. Musculoskeletal:  Symmetrical without gross deformities.  Pulses:  Normal pulses noted. Extremities:  Without clubbing or edema. Neurologic:  Alert and  oriented x4. No focal deficits.  Skin:  Intact without significant lesions or rashes. Psych:  Alert and cooperative. Normal mood and affect.  Intake/Output from previous day: 12/23 0701 - 12/24 0700 In: 1792.2 [I.V.:1000; Blood:642.5; IV Piggyback:149.7] Out: 1000 [Urine:1000] Intake/Output this shift: Total I/O In: 1792.2 [I.V.:1000; Blood:642.5; IV Piggyback:149.7] Out: 1000 [Urine:1000]  Lab Results: Recent Labs    06/27/21 1805  WBC 5.4  HGB 6.3*  HCT 20.5*  PLT 332   BMET Recent Labs    06/27/21 1805  NA 135  K 3.7  CL 104  CO2 26  GLUCOSE 111*  BUN 31*  CREATININE 1.53*  CALCIUM 8.4*   LFT Recent Labs    06/27/21 1805  PROT 5.6*  ALBUMIN 3.3*  AST 30  ALT 20  ALKPHOS 35*  BILITOT 0.9   PT/INR Recent Labs    06/27/21 1805  LABPROT 69.4*  INR 8.4*   Hepatitis Panel No results for input(s): HEPBSAG, HCVAB,  HEPAIGM, HEPBIGM in the last 72 hours.    Studies/Results: No results found.  IMPRESSION/PLAN:  27) 75 year old male with a history of a GI bleed secondary to small bowel AVMs 2015 who was admitted to the hospital 06/27/2021 epigastric pain, black stools x 2 week with associated anemia. On coumadin for afib/past MVR). Admission Hg level 6. Transfused 2 units of PRBCs. Post transfusion Hg 7.2.  No further melena since arriving to the ED -H/H Q 6 hrs x 24 hrs  -Transfuse for Hg < 7 -Clear liquid diet -Small bowel enteroscopy when INR < 2, tentatively schedule for tomorrow. Enteroscopy benefits and risks discussed including risk with sedation, risk of bleeding, perforation and  infection  -Continue PPI infusion  -Continue to monitor patient closely for active GI bleeding   2) Atrial fibrillation on Coumadin.  Admission INR 8.4 -> received Vitamin K 10mg  IV x 1 -> INR 2.4. Last dose of Coumadin was am 12/23. -Continue to hold Coumadin  -INR in am  3) CHF. Nonischemic cardiomyopathy. S/P mechanical MVR, ECHO 08/01/2019 with LV EF 35 to 40%  4) Past pacemaker placement for bradycardia      Noralyn Pick  06/28/2021, 06:05 [id number] AM

## 2021-06-28 NOTE — Care Management CC44 (Signed)
Condition Code 44 Documentation Completed  Patient Details  Name: [name] MRN: [id number] Date of Birth: 1945-10-15   Condition Code 44 given:  Yes Patient signature on Condition Code 44 notice:  Yes Documentation of 2 MD's agreement:    Code 44 added to claim:       Windle Guard, LCSW 06/28/2021, 8:38 AM

## 2021-06-28 NOTE — Progress Notes (Addendum)
HD#1 SUBJECTIVE:  Patient Summary: [name] is a 75 y.o. with a pertinent PMH of Afib on coumadin, systolic HF, HTN, BPH, and CKD III, who presented with dark stools and fatigue and admitted for acute GI bleed.   Overnight Events: No acute events overnight  Interim History: This is hospital day 1 for [name] who was seen and evaluated at the bedside this morning. He states that he has not had a bowel movement since has been in the hospital. Denies any further episodes of lightheadedness/presyncope. States that he is feeling better, but understands that he has to stay in the hospital for his procedure, since he is likely still bleeding.   OBJECTIVE:  Vital Signs: Vitals:   06/28/21 0430 06/28/21 0530 06/28/21 0643 06/28/21 0643  BP: (!) 101/48 (!) 102/51 116/71   Pulse: 68 74 80   Resp: (!) 23 (!) 29 (!) 24   Temp:    98.1 F (36.7 C)  TempSrc:    Oral  SpO2: 93% 100% 100%    Supplemental O2: Nasal Cannula SpO2: 100 % O2 Flow Rate (L/min): 2 L/min  There were no vitals filed for this visit.   Intake/Output Summary (Last 24 hours) at 06/28/2021 0649 Last data filed at 06/28/2021 6387 Gross per 24 hour  Intake 3202.15 ml  Output 1000 ml  Net 2202.15 ml   Net IO Since Admission: 2,202.15 mL [06/28/21 0649]  Physical Exam: General: Pleasant, elderly male laying in bed. No acute distress. CV: Irregularly irregular. Mechanical valve sound appreciated Pulmonary: Lungs CTAB. Normal effort. No wheezing or rales. Abdominal: Soft, nontender, nondistended. Normal bowel sounds. Extremities: Palpable radial and DP pulses. Normal ROM. Skin: Warm and dry. Tinea pedis bilaterally Neuro: A&Ox3. No focal deficit. Psych: Normal mood and affect    ASSESSMENT/PLAN:  Assessment: Principal Problem:   Acute GI bleeding Active Problems:   GI bleed   Plan: #Acute GI bleed Presented with 1 week of dark stools and hypotension. Hb 6.3 on admission and patient was given  2u of blood and started on protonix. Morning Hb improved to 7.2, not as significant of an improvement as we expected.  Of note, patient's last colonoscopy was in 2015 which was normal, aside from diverticula. 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  - GI consulted, appreciate recs - Small bowl enteroscopy when INR <2 - Continue PPI - Transfuse for Hb <7 - Clear liquid diet, NPO at midnight - CBC q6h  - PT/INR in the morning  #Supratherapeutic INR #Afib on coumadin Patient has missed his last 2 Coumadin checks. PT 69.4 and INR 8.4 on admission; patient was given 10 mg of vitamin K. Repeat PT/INR this morning 25.7/2.4.  - Monitor PT/INR in AM - Continue to hold coumadin - Cardiac telemetry  - Will need to bridge coumadin with heparin after procedure with GI  #CKD III Cr 1.53 on admission, GFR 47 improved to 1.32 today and GFR of 56.  - Avoid nephrotoxins - Continue to monitor BMP  #Nonischemic cardiomyopathy  #S/p mechanical MVR #HFrEF, EF 35-40% #Hypertension Last echo in 2021 with EF 35-40% and severe dyskinesis of LV. On carvedilol 6.25 mg bid and lisinopril 2.5 mg at home. Patient was started on IVF in the ED, however, will discontinue these to avoid volume overloading him.  - Resumed carvedilol 6.25 mg bid and lisinopril 2.5  mg  - Cardiac telemetry - Strict I&Os and daily weights  #BPH - Resumed home flomax 0.8 mg daily    Best Practice: Diet: Clear liquid diet IVF: None VTE: SCDs Code: Full AB: None DISPO: Anticipated discharge to Home pending Medical stability and workup completion.  Signature: Buddy Duty, D.O.  Internal Medicine Resident, PGY-1 [facility]  Pager: [phone] 6:49 AM, 06/28/2021   Please contact the on call pager after 5 pm and on weekends at [phone].

## 2021-06-29 ENCOUNTER — Encounter (HOSPITAL_COMMUNITY)
Admission: EM | Disposition: A | Discharge status: Home / Self Care | Payer: Self-pay | Source: Home / Self Care | Attending: Emergency Medicine

## 2021-06-29 ENCOUNTER — Inpatient Hospital Stay (HOSPITAL_COMMUNITY): Payer: 59 | Admitting: Anesthesiology

## 2021-06-29 ENCOUNTER — Encounter (HOSPITAL_COMMUNITY): Payer: Self-pay | Admitting: Internal Medicine

## 2021-06-29 DIAGNOSIS — Z20822 Contact with and (suspected) exposure to covid-19: Secondary | ICD-10-CM | POA: Diagnosis not present

## 2021-06-29 DIAGNOSIS — K2970 Gastritis, unspecified, without bleeding: Secondary | ICD-10-CM

## 2021-06-29 DIAGNOSIS — K922 Gastrointestinal hemorrhage, unspecified: Secondary | ICD-10-CM | POA: Diagnosis not present

## 2021-06-29 DIAGNOSIS — K31819 Angiodysplasia of stomach and duodenum without bleeding: Secondary | ICD-10-CM | POA: Diagnosis not present

## 2021-06-29 DIAGNOSIS — I4891 Unspecified atrial fibrillation: Secondary | ICD-10-CM | POA: Diagnosis not present

## 2021-06-29 DIAGNOSIS — N183 Chronic kidney disease, stage 3 unspecified: Secondary | ICD-10-CM | POA: Diagnosis not present

## 2021-06-29 HISTORY — PX: BIOPSY: SHX5522

## 2021-06-29 HISTORY — PX: SUBMUCOSAL TATTOO INJECTION: SHX6856

## 2021-06-29 HISTORY — PX: HOT HEMOSTASIS: SHX5433

## 2021-06-29 HISTORY — PX: ENTEROSCOPY: SHX5533

## 2021-06-29 LAB — BASIC METABOLIC PANEL
Anion gap: 7 (ref 5–15)
BUN: 18 mg/dL (ref 8–23)
CO2: 21 mmol/L — ABNORMAL LOW (ref 22–32)
Calcium: 8.4 mg/dL — ABNORMAL LOW (ref 8.9–10.3)
Chloride: 108 mmol/L (ref 98–111)
Creatinine, Ser: 1.34 mg/dL — ABNORMAL HIGH (ref 0.61–1.24)
GFR, Estimated: 55 mL/min — ABNORMAL LOW (ref >60)
Glucose, Bld: 89 mg/dL (ref 70–99)
Potassium: 3.8 mmol/L (ref 3.5–5.1)
Sodium: 136 mmol/L (ref 135–145)

## 2021-06-29 LAB — CBC
HCT: 26 % — ABNORMAL LOW (ref 39.0–52.0)
HCT: 26.3 % — ABNORMAL LOW (ref 39.0–52.0)
HCT: 24.2 % — ABNORMAL LOW (ref 39.0–52.0)
Hemoglobin: 7.9 g/dL — ABNORMAL LOW (ref 13.0–17.0)
Hemoglobin: 8.1 g/dL — ABNORMAL LOW (ref 13.0–17.0)
Hemoglobin: 7.5 g/dL — ABNORMAL LOW (ref 13.0–17.0)
MCH: 27 pg (ref 26.0–34.0)
MCH: 27.3 pg (ref 26.0–34.0)
MCH: 27.4 pg (ref 26.0–34.0)
MCHC: 30.4 g/dL (ref 30.0–36.0)
MCHC: 30.8 g/dL (ref 30.0–36.0)
MCHC: 31 g/dL (ref 30.0–36.0)
MCV: 88.6 fL (ref 80.0–100.0)
MCV: 88.7 fL (ref 80.0–100.0)
MCV: 88.3 fL (ref 80.0–100.0)
Platelets: 292 10*3/uL (ref 150–400)
Platelets: 299 10*3/uL (ref 150–400)
Platelets: 307 K/uL (ref 150–400)
RBC: 2.93 MIL/uL — ABNORMAL LOW (ref 4.22–5.81)
RBC: 2.97 MIL/uL — ABNORMAL LOW (ref 4.22–5.81)
RBC: 2.74 MIL/uL — ABNORMAL LOW (ref 4.22–5.81)
RDW: 17.6 % — ABNORMAL HIGH (ref 11.5–15.5)
RDW: 17.8 % — ABNORMAL HIGH (ref 11.5–15.5)
RDW: 17.7 % — ABNORMAL HIGH (ref 11.5–15.5)
WBC: 8.3 10*3/uL (ref 4.0–10.5)
WBC: 8.3 10*3/uL (ref 4.0–10.5)
WBC: 7.7 K/uL (ref 4.0–10.5)
nRBC: 0 % (ref 0.0–0.2)
nRBC: 0.2 % (ref 0.0–0.2)
nRBC: 0.4 % — ABNORMAL HIGH (ref 0.0–0.2)

## 2021-06-29 LAB — PROTIME-INR
INR: 1.5 — ABNORMAL HIGH (ref 0.8–1.2)
Prothrombin Time: 18.3 seconds — ABNORMAL HIGH (ref 11.4–15.2)

## 2021-06-29 SURGERY — ENTEROSCOPY
Anesthesia: Monitor Anesthesia Care

## 2021-06-29 MED ORDER — SPOT INK MARKER SYRINGE KIT
PACK | SUBMUCOSAL | Status: DC | PRN
  Administered 2021-06-29: 1 mL via SUBMUCOSAL

## 2021-06-29 MED ORDER — HEPARIN (PORCINE) 25000 UT/250ML-% IV SOLN
900.0000 [IU]/h | INTRAVENOUS | Status: DC
  Administered 2021-06-29: 18:00:00 800 [IU]/h via INTRAVENOUS
  Filled 2021-06-29: qty 250

## 2021-06-29 MED ORDER — SENNOSIDES-DOCUSATE SODIUM 8.6-50 MG PO TABS
1.0000 | ORAL_TABLET | Freq: Every day | ORAL | Status: DC
  Administered 2021-06-29: 08:00:00 1 via ORAL
  Filled 2021-06-29 (×2): qty 1

## 2021-06-29 MED ORDER — PANTOPRAZOLE SODIUM 40 MG PO TBEC
40.0000 mg | DELAYED_RELEASE_TABLET | Freq: Two times a day (BID) | ORAL | Status: DC
  Administered 2021-06-29 – 2021-06-30 (×3): 40 mg via ORAL
  Filled 2021-06-29 (×3): qty 1

## 2021-06-29 MED ORDER — LIDOCAINE 2% (20 MG/ML) 5 ML SYRINGE
INTRAMUSCULAR | Status: DC | PRN
  Administered 2021-06-29: 40 mg via INTRAVENOUS

## 2021-06-29 MED ORDER — PROPOFOL 500 MG/50ML IV EMUL
INTRAVENOUS | Status: DC | PRN
  Administered 2021-06-29: 75 ug/kg/min via INTRAVENOUS

## 2021-06-29 MED ORDER — WARFARIN - PHARMACIST DOSING INPATIENT: Freq: Every day | Status: DC

## 2021-06-29 MED ORDER — PHENYLEPHRINE 40 MCG/ML (10ML) SYRINGE FOR IV PUSH (FOR BLOOD PRESSURE SUPPORT)
PREFILLED_SYRINGE | INTRAVENOUS | Status: DC | PRN
  Administered 2021-06-29 (×4): 80 ug via INTRAVENOUS

## 2021-06-29 MED ORDER — POLYETHYLENE GLYCOL 3350 17 G PO PACK
17.0000 g | PACK | Freq: Every day | ORAL | Status: DC | PRN
  Administered 2021-06-29: 08:00:00 17 g via ORAL
  Filled 2021-06-29: qty 1

## 2021-06-29 MED ORDER — WARFARIN SODIUM 5 MG PO TABS
5.0000 mg | ORAL_TABLET | Freq: Once | ORAL | Status: AC
  Administered 2021-06-29: 16:00:00 5 mg via ORAL
  Filled 2021-06-29: qty 1

## 2021-06-29 NOTE — Progress Notes (Signed)
°  Transition of Care [facility]) Screening Note   Patient Details  Name: [name] Date of Birth: [birth year]   Transition of [facility]) CM/SW Contact:    Bess Kinds, RN Phone Number: [phone] 06/29/2021, 10:46 AM    Transition of Care Department [facility]) has reviewed patient and no TOC needs have been identified at this time. We will continue to monitor patient advancement through interdisciplinary progression rounds. If new patient transition needs arise, please place a TOC consult.

## 2021-06-29 NOTE — Interval H&P Note (Signed)
History and Physical Interval Note:  06/29/2021 9:43 AM  [name]  has presented today for surgery, with the diagnosis of Anemai, melena, bleed history of small bowel AVMs.  The various methods of treatment have been discussed with the patient and family. After consideration of risks, benefits and other options for treatment, the patient has consented to  Procedure(s): ENTEROSCOPY (N/A) as a surgical intervention.  The patient's history has been reviewed, patient examined, no change in status, stable for surgery.  I have reviewed the patient's chart and labs.  Questions were answered to the patient's satisfaction.     [employer]

## 2021-06-29 NOTE — Anesthesia Postprocedure Evaluation (Signed)
Anesthesia Post Note  Patient: [name]  Procedure(s) Performed: ENTEROSCOPY SUBMUCOSAL TATTOO INJECTION BIOPSY     Patient location during evaluation: Other Anesthesia Type: MAC Level of consciousness: awake and alert Pain management: pain level controlled Vital Signs Assessment: post-procedure vital signs reviewed and stable Respiratory status: spontaneous breathing, nonlabored ventilation and respiratory function stable Cardiovascular status: stable and blood pressure returned to baseline Postop Assessment: no apparent nausea or vomiting Anesthetic complications: no   No notable events documented.  Last Vitals:  Vitals:   06/29/21 1113 06/29/21 1134  BP: (!) 97/54 (!) 97/59  Pulse:  72  Resp:  17  Temp:  36.7 C  SpO2: 92%     Last Pain:  Vitals:   06/29/21 1134  TempSrc: Oral  PainSc:                  Riata Ikeda,W. EDMOND

## 2021-06-29 NOTE — Progress Notes (Signed)
HD#2 SUBJECTIVE:  Patient Summary: [name] is a 75 y.o. with a pertinent PMH of Afib on coumadin, systolic HF, HTN, BPH, and CKD III, who presented with dark stools and fatigue and admitted for acute GI bleed and supratherapeutic INR.   Overnight Events: No acute events overnight  Interim History: This is hospital day 2 for this patient who was seen and evaluated at the bedside this morning. He states that he feels well this morning and has not felt dizzy/lightheaded. Patient has not had a bowel movement since he has been in the hospital, and thus, is unsure if there is any further blood in his stools. No acute complaints today.   OBJECTIVE:  Vital Signs: Vitals:   06/28/21 1717 06/28/21 1938 06/29/21 0006 06/29/21 0300  BP: 137/77 100/61 101/61 (!) 93/49  Pulse:  71 80 71  Resp:  19 (!) 23 14  Temp:  98.2 F (36.8 C) 98.4 F (36.9 C) 98.4 F (36.9 C)  TempSrc:  Oral Oral Oral  SpO2:  96% 99% 90%  Weight:    81.3 kg  Height:       Supplemental O2: Room Air SpO2: 90 % O2 Flow Rate (L/min): 2 L/min  Filed Weights   06/29/21 0300  Weight: 81.3 kg     Intake/Output Summary (Last 24 hours) at 06/29/2021 0547 Last data filed at 06/29/2021 0329 Gross per 24 hour  Intake 2010.72 ml  Output 2320 ml  Net -309.28 ml   Net IO Since Admission: 482.87 mL [06/29/21 0547]  Physical Exam: General: Pleasant, elderly male laying in bed. No acute distress. CV: Irregularly irregular. Mechanical valve sound appreciated Pulmonary: Lungs CTAB. Normal effort.  Abdominal: Soft, nontender, nondistended. Normal bowel sounds. Extremities: Palpable radial and DP pulses.  Skin: Warm and dry with tinea pedis in bilateral feet Neuro: A&Ox3. No focal deficit. Psych: Normal mood and affect    ASSESSMENT/PLAN:  Assessment: Principal Problem:   Acute GI bleeding Active Problems:   GI bleed   Plan: #Acute GI bleed Presented with 1 week of dark stools and hypotension. Hb 6.3 on  admission and patient was given 2u of blood and started on protonix. Most recent Hb improved to 8.1.  - GI consulted, appreciate recs - Small bowl enteroscopy today - Continue PPI - Transfuse for Hb <7 - Daily CBC  #Supratherapeutic INR #Afib on coumadin Patient has missed his last 2 Coumadin checks. PT 69.4 and INR 8.4 on admission; patient was given 10 mg of vitamin K. Most recent PT/INR this morning 18.3/1.5. Will need to bridge coumadin with heparin after GI procedure today.  - Monitor PT/INR in AM - Continue to hold coumadin in setting of GI procedure - Cardiac telemetry   #CKD III Cr 1.53 on admission, GFR 47 improved to 1.34 and GFR 55 today.  - Avoid nephrotoxins - Continue to monitor BMP  #Nonischemic cardiomyopathy  #S/p mechanical MVR #HFrEF, EF 35-40% #Hypertension Last echo in 2021 with EF 35-40% and severe dyskinesis of LV. On carvedilol 6.25 mg bid and lisinopril 2.5 mg at home.  - Continue carvedilol 6.25 mg bid and lisinopril 2.5 mg  - Cardiac telemetry - Strict I&Os and daily weights  #BPH - Home flomax 0.8 mg daily    Best Practice: Diet: NPO, procedure later today IVF: None VTE: Place and maintain sequential compression device Start: 06/28/21 0650 Code: Full AB: None DISPO: Anticipated discharge to Home pending Medical stability and workup completion.  Signature: Buddy Duty, D.O.  Internal  Medicine Resident, PGY-1 [facility]  Pager: [phone] 5:47 AM, 06/29/2021   Please contact the on call pager after 5 pm and on weekends at [phone].

## 2021-06-29 NOTE — Progress Notes (Signed)
PT Cancellation Note  Patient Details Name: [name] MRN: [id number] DOB: 1945/09/04   Cancelled Treatment:    Reason Eval/Treat Not Completed: Patient at procedure or test/unavailable (enteroscopy). Will follow-up for PT Evaluation tomorrow.  Ina Homes, PT, DPT Acute Rehabilitation Services  Pager [phone] Office [phone]  Malachy Chamber 06/29/2021, 10:16 AM

## 2021-06-29 NOTE — Anesthesia Procedure Notes (Signed)
Procedure Name: MAC Date/Time: 06/29/2021 10:10 AM Performed by: Kyung Rudd, CRNA Pre-anesthesia Checklist: Patient identified, Emergency Drugs available, Suction available and Patient being monitored Patient Re-evaluated:Patient Re-evaluated prior to induction Oxygen Delivery Method: Nasal cannula Induction Type: IV induction Placement Confirmation: positive ETCO2 Dental Injury: Teeth and Oropharynx as per pre-operative assessment

## 2021-06-29 NOTE — Anesthesia Preprocedure Evaluation (Addendum)
Anesthesia Evaluation  Patient identified by MRN, date of birth, ID band Patient awake    Reviewed: Allergy & Precautions, H&P , NPO status , Patient's Chart, lab work & pertinent test results, reviewed documented beta blocker date and time   Airway Mallampati: II  TM Distance: >3 FB Neck ROM: Full    Dental no notable dental hx. (+) Edentulous Upper, Edentulous Lower, Dental Advisory Given   Pulmonary neg pulmonary ROS, former smoker,    Pulmonary exam normal breath sounds clear to auscultation       Cardiovascular hypertension, Pt. on medications and Pt. on home beta blockers +CHF  negative cardio ROS  + dysrhythmias Atrial Fibrillation + pacemaker  Rhythm:Regular Rate:Normal     Neuro/Psych negative neurological ROS  negative psych ROS   GI/Hepatic Neg liver ROS, GERD  ,  Endo/Other  negative endocrine ROS  Renal/GU Renal InsufficiencyRenal disease  negative genitourinary   Musculoskeletal   Abdominal   Peds  Hematology  (+) Blood dyscrasia, anemia ,   Anesthesia Other Findings   Reproductive/Obstetrics negative OB ROS                            Anesthesia Physical Anesthesia Plan  ASA: 3  Anesthesia Plan: MAC   Post-op Pain Management: Minimal or no pain anticipated   Induction: Intravenous  PONV Risk Score and Plan: 1 and Propofol infusion  Airway Management Planned: Nasal Cannula and Natural Airway  Additional Equipment:   Intra-op Plan:   Post-operative Plan:   Informed Consent: I have reviewed the patients History and Physical, chart, labs and discussed the procedure including the risks, benefits and alternatives for the proposed anesthesia with the patient or authorized representative who has indicated his/her understanding and acceptance.     Dental advisory given  Plan Discussed with: CRNA  Anesthesia Plan Comments:         Anesthesia Quick  Evaluation

## 2021-06-29 NOTE — Progress Notes (Signed)
ANTICOAGULATION CONSULT NOTE - Initial Consult  Pharmacy Consult for heparin / warfarin Indication: atrial fibrillation and mechanical mitral valve   No Known Allergies  Patient Measurements: Height: 5\' 8"  (172.7 cm) Weight: 81.3 kg (179 lb 3.7 oz) IBW/kg (Calculated) : 68.4   Vital Signs: Temp: 98 F (36.7 C) (12/25 1134) Temp Source: Oral (12/25 1134) BP: 97/59 (12/25 1134) Pulse Rate: 72 (12/25 1134)  Labs: Recent Labs    06/27/21 1805 06/28/21 0600 06/28/21 1244 06/28/21 1826 06/29/21 0043  HGB 6.3* 7.2* 7.4* 7.6* 8.1*   7.9*  HCT 20.5* 23.7* 24.2* 24.4* 26.3*   26.0*  PLT 332 271 287 301 299   292  LABPROT 69.4* 25.7*  --   --  18.3*  INR 8.4* 2.4*  --   --  1.5*  CREATININE 1.53* 1.32*  --   --  1.34*    Estimated Creatinine Clearance: 44.7 mL/min (A) (by C-G formula based on SCr of 1.34 mg/dL (H)).   Medical History: Past Medical History:  Diagnosis Date   Anemia 01/29/2016   Atrial fibrillation (HCC)    post op.  s/p dc-cv 04/2008. 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  a- Echo 07/2008 Ef 55% mild peroprosthetic MVR with High transmitral gradient (mean 14).  b- normal coronaries by cath 12/2007   BPH (benign prostatic hyperplasia)    Cardiac resynchronization therapy pacemaker (CRT-P) in place 06/22/2011   CHF (congestive heart failure) (HCC)    EF 35-40 % 2011 due to valvular disease and diastolic dysfunction   Chronic back pain    CKD (chronic kidney disease), stage III (HCC)    COVID-19 virus infection 07/24/2020   Tested positive on July 16, 2020   Dysphagia    with normal barium swallow 09/2008   Endocarditis    GERD (gastroesophageal reflux disease)    Heavy alcohol use    history   History of atrial flutter 03/13/2008   History of atrial flutter - resolved.    History of GI bleed     Hypertension    IV drug abuse (HCC)    history of   Lower GI bleed 01/2016   Pacemaker 2010      Assessment: 75yom with Hx Mechanical MVR and Afib - on warfarin PTA 2.5mg  MT and 5mg  all other days  Admit INR high at 8 and hgb low 6.3 > PRBC and Vit K  S/p endoscopy - non bleeding lesion treated and ok to begin low dose heparin and resume warfarin tonight.   Goal of Therapy:  INR 2.5-3.5 Heparin level 0.3-0.5 Monitor platelets by anticoagulation protocol: Yes   Plan:  Warfarin 5mg  x1 tonight  Heparin drip 800 uts/hr  - start 6pm  Daily Heparin level CBC and protime  Monitor s/s bleeding     2011 Pharm.D. CPP, BCPS Clinical Pharmacist [phone] 06/29/2021 3:33 PM

## 2021-06-29 NOTE — Op Note (Signed)
[facility] Patient Name: [name] Procedure Date : 06/29/2021 MRN: [id number] Attending MD: Justice Britain , MD Date of Birth: 1946-06-08 CSN: [id number] Age: 75 Admit Type: Inpatient Procedure:                Small bowel enteroscopy Indications:              Acute post hemorrhagic anemia, Melena, Occult blood                            in stool Providers:                Justice Britain, MD, Carlyn Reichert, RN, Benetta Spar, Technician, Rhae Lerner, CRNA Referring MD:             Internal Medicine Service, Milus Banister, MD Medicines:                Monitored Anesthesia Care Complications:            No immediate complications. Estimated Blood Loss:     Estimated blood loss was minimal. Procedure:                Pre-Anesthesia Assessment:                           - Prior to the procedure, a History and Physical                            was performed, and patient medications and                            allergies were reviewed. The patient's tolerance of                            previous anesthesia was also reviewed. The risks                            and benefits of the procedure and the sedation                            options and risks were discussed with the patient.                            All questions were answered, and informed consent                            was obtained. Prior Anticoagulants: The patient has                            taken Coumadin (warfarin), last dose was 2 days                            prior to procedure. ASA Grade Assessment: III - A  patient with severe systemic disease. After                            reviewing the risks and benefits, the patient was                            deemed in satisfactory condition to undergo the                            procedure.                           After obtaining informed consent, the endoscope was                             passed under direct vision. Throughout the                            procedure, the patient's blood pressure, pulse, and                            oxygen saturations were monitored continuously. The                            [id number]-[id number] ([id number] Olympus peds colonoscope was                            introduced through the mouth and advanced to the                            proximal jejunum. The small bowel enteroscopy was                            accomplished without difficulty. The patient                            tolerated the procedure. Scope In: Scope Out: Findings:      No gross lesions were noted in the entire esophagus.      The Z-line was regular and was found 41 cm from the incisors.      A 2 cm hiatal hernia was present.      Localized moderate inflammation characterized by erosions, erythema,       granularity and linear erosions was found in the gastric body and in the       gastric antrum.      No other gross lesions were noted in the entire examined stomach.       Biopsies were taken with a cold forceps for histology and Helicobacter       pylori testing.      Normal mucosa was found in the entire duodenum other than a duodenal       diverticulum in D3.      A single angiodysplastic lesion with no bleeding was found in the       proximal jejunum. Coagulation for bleeding prevention using argon plasma       was successful.      Normal  mucosa was found in the rest of the visualized jejunum. Area was       tattooed with an injection of Spot (carbon black) to demarcate the       distal extent of today's SBE for marking purposes. Impression:               - No gross lesions in esophagus. Z-line regular, 41                            cm from the incisors.                           - 2 cm hiatal hernia.                           - Gastritis distally. No other gross lesions in the                            stomach. Biopsied for HP.                            - Normal mucosa was found in the entire examined                            duodenum other than a duodenal diverticulum in D3.                           - A single non-bleeding angiodysplastic lesion in                            the proximal jejunum. Treated with argon plasma                            coagulation (APC).                           - Normal mucosa was found in the rest of the                            visualized proximal jejunum. Tattooed the distal                            extent. Recommendation:           - The patient will be observed post-procedure,                            until all discharge criteria are met.                           - Return patient to hospital ward for ongoing care.                           - Resume regular diet.                           -  PO PPI 40 mg twice daily for 2 months. Then may                            remain on once daily dosing.                           - Await pathology results.                           - May consider heparin in 6 hours with Coumadin                            start this evening. Or if NOAC is to be considered,                            then can be started later this evening.                           - No further GI inpatient evaluation at this time.                            If other issues develop please let us know.                           - The findings and recommendations were discussed                            with the patient.                           - The findings and recommendations were discussed                            with the referring physician. Procedure Code(s):        --- Professional ---                           [phone], Small intestinal endoscopy, enteroscopy                            beyond second portion of duodenum, not including                            ileum; with biopsy, single or multiple                           35465, Unlisted procedure, small  intestine Diagnosis Code(s):        --- Professional ---                           K44.9, Diaphragmatic hernia without obstruction or                            gangrene  K29.70, Gastritis, unspecified, without bleeding                           K31.819, Angiodysplasia of stomach and duodenum                            without bleeding                           D62, Acute posthemorrhagic anemia                           K92.1, Melena (includes Hematochezia)                           R19.5, Other fecal abnormalities CPT copyright 2019 [employer]. All rights reserved. The codes documented in this report are preliminary and upon coder review may  be revised to meet current compliance requirements. Justice Britain, MD 06/29/2021 11:23:59 AM Number of Addenda: 0

## 2021-06-29 NOTE — Transfer of Care (Signed)
Immediate Anesthesia Transfer of Care Note  Patient: [name]  Procedure(s) Performed: ENTEROSCOPY SUBMUCOSAL TATTOO INJECTION BIOPSY  Patient Location: Endoscopy Unit  Anesthesia Type:MAC  Level of Consciousness: awake and confused  Airway & Oxygen Therapy: Patient Spontanous Breathing and Patient connected to nasal cannula oxygen  Post-op Assessment: Report given to RN, Post -op Vital signs reviewed and stable and Patient moving all extremities  Post vital signs: Reviewed and stable  Last Vitals:  Vitals Value Taken Time  BP    Temp    Pulse    Resp    SpO2      Last Pain:  Vitals:   06/29/21 0941  TempSrc: Temporal  PainSc: 0-No pain         Complications: No notable events documented.

## 2021-06-29 NOTE — Plan of Care (Signed)
°  Problem: Clinical Measurements: °Goal: Will remain free from infection °Outcome: Progressing °  °Problem: Nutrition: °Goal: Adequate nutrition will be maintained °Outcome: Progressing °  °Problem: Coping: °Goal: Level of anxiety will decrease °Outcome: Progressing °  °

## 2021-06-30 ENCOUNTER — Encounter (HOSPITAL_COMMUNITY): Payer: Self-pay | Admitting: Gastroenterology

## 2021-06-30 DIAGNOSIS — D649 Anemia, unspecified: Secondary | ICD-10-CM | POA: Diagnosis not present

## 2021-06-30 DIAGNOSIS — K922 Gastrointestinal hemorrhage, unspecified: Secondary | ICD-10-CM | POA: Diagnosis not present

## 2021-06-30 DIAGNOSIS — I4892 Unspecified atrial flutter: Secondary | ICD-10-CM | POA: Diagnosis not present

## 2021-06-30 LAB — BASIC METABOLIC PANEL
Anion gap: 6 (ref 5–15)
BUN: 16 mg/dL (ref 8–23)
CO2: 19 mmol/L — ABNORMAL LOW (ref 22–32)
Calcium: 8.2 mg/dL — ABNORMAL LOW (ref 8.9–10.3)
Chloride: 110 mmol/L (ref 98–111)
Creatinine, Ser: 1.72 mg/dL — ABNORMAL HIGH (ref 0.61–1.24)
GFR, Estimated: 40 mL/min — ABNORMAL LOW (ref >60)
Glucose, Bld: 95 mg/dL (ref 70–99)
Potassium: 3.5 mmol/L (ref 3.5–5.1)
Sodium: 135 mmol/L (ref 135–145)

## 2021-06-30 LAB — CBC
HCT: 24 % — ABNORMAL LOW (ref 39.0–52.0)
Hemoglobin: 7.3 g/dL — ABNORMAL LOW (ref 13.0–17.0)
MCH: 27.2 pg (ref 26.0–34.0)
MCHC: 30.4 g/dL (ref 30.0–36.0)
MCV: 89.6 fL (ref 80.0–100.0)
Platelets: 306 10*3/uL (ref 150–400)
RBC: 2.68 MIL/uL — ABNORMAL LOW (ref 4.22–5.81)
RDW: 17.6 % — ABNORMAL HIGH (ref 11.5–15.5)
WBC: 7.1 10*3/uL (ref 4.0–10.5)
nRBC: 0.3 % — ABNORMAL HIGH (ref 0.0–0.2)

## 2021-06-30 LAB — IRON AND TIBC
Iron: 17 ug/dL — ABNORMAL LOW (ref 45–182)
Saturation Ratios: 5 % — ABNORMAL LOW (ref 17.9–39.5)
TIBC: 374 ug/dL (ref 250–450)
UIBC: 357 ug/dL

## 2021-06-30 LAB — PREPARE RBC (CROSSMATCH)

## 2021-06-30 LAB — FERRITIN: Ferritin: 14 ng/mL — ABNORMAL LOW (ref 24–336)

## 2021-06-30 LAB — PROTIME-INR
INR: 1.6 — ABNORMAL HIGH (ref 0.8–1.2)
Prothrombin Time: 18.7 seconds — ABNORMAL HIGH (ref 11.4–15.2)

## 2021-06-30 LAB — HEPARIN LEVEL (UNFRACTIONATED): Heparin Unfractionated: 0.15 IU/mL — ABNORMAL LOW (ref 0.30–0.70)

## 2021-06-30 MED ORDER — ENOXAPARIN (LOVENOX) PATIENT EDUCATION KIT
PACK | Freq: Once | Status: AC
Start: 2021-06-30 — End: 2021-06-30
  Filled 2021-06-30: qty 1

## 2021-06-30 MED ORDER — SODIUM CHLORIDE 0.9 % IV SOLN: 25.0000 mg | Freq: Once | INTRAVENOUS | Status: DC

## 2021-06-30 MED ORDER — SODIUM CHLORIDE 0.9 % IV SOLN
250.0000 mg | Freq: Once | INTRAVENOUS | Status: DC
  Filled 2021-06-30: qty 20

## 2021-06-30 MED ORDER — SODIUM CHLORIDE 0.9 % IV SOLN: 1000.0000 mg | Freq: Once | INTRAVENOUS | Status: DC

## 2021-06-30 MED ORDER — ENOXAPARIN SODIUM 80 MG/0.8ML IJ SOSY
1.0000 mg/kg | PREFILLED_SYRINGE | Freq: Two times a day (BID) | INTRAMUSCULAR | Status: DC
  Administered 2021-06-30: 17:00:00 80 mg via SUBCUTANEOUS
  Filled 2021-06-30: qty 0.8

## 2021-06-30 MED ORDER — SODIUM CHLORIDE 0.9 % IV SOLN
250.0000 mg | Freq: Once | INTRAVENOUS | Status: AC
  Administered 2021-06-30: 12:00:00 250 mg via INTRAVENOUS
  Filled 2021-06-30: qty 20

## 2021-06-30 MED ORDER — ENOXAPARIN SODIUM 80 MG/0.8ML IJ SOSY: 1.0000 mg/kg | PREFILLED_SYRINGE | Freq: Two times a day (BID) | INTRAMUSCULAR | 0 refills | Status: DC

## 2021-06-30 MED ORDER — PANTOPRAZOLE SODIUM 40 MG PO TBEC: 40.0000 mg | DELAYED_RELEASE_TABLET | Freq: Two times a day (BID) | ORAL | 0 refills | Status: DC

## 2021-06-30 MED ORDER — ENOXAPARIN (LOVENOX) PATIENT EDUCATION KIT: 1.0000 | PACK | Freq: Once | 0 refills | Status: AC

## 2021-06-30 MED ORDER — SODIUM CHLORIDE 0.9% IV SOLUTION: Freq: Once | INTRAVENOUS | Status: AC

## 2021-06-30 MED ORDER — WARFARIN SODIUM 5 MG PO TABS: ORAL_TABLET | ORAL | 0 refills | Status: DC

## 2021-06-30 NOTE — Progress Notes (Signed)
ANTICOAGULATION CONSULT NOTE   Pharmacy Consult for heparin / warfarin Indication: atrial fibrillation and mechanical mitral valve   No Known Allergies  Patient Measurements: Height: 5\' 8"  (172.7 cm) Weight: 81.3 kg (179 lb 3.7 oz) IBW/kg (Calculated) : 68.4   Vital Signs: Temp: 98.1 F (36.7 C) (12/26 0001) Temp Source: Oral (12/26 0001) BP: 98/56 (12/26 0001) Pulse Rate: 75 (12/26 0001)  Labs: Recent Labs    06/27/21 1805 06/28/21 0600 06/28/21 1244 06/29/21 0043 06/29/21 2059 06/30/21 0157  HGB 6.3* 7.2*   < > 8.1*   7.9* 7.5* 7.3*  HCT 20.5* 23.7*   < > 26.3*   26.0* 24.2* 24.0*  PLT 332 271   < > 299   292 307 306  LABPROT 69.4* 25.7*  --  18.3*  --  18.7*  INR 8.4* 2.4*  --  1.5*  --  1.6*  HEPARINUNFRC  --   --   --   --   --  0.15*  CREATININE 1.53* 1.32*  --  1.34*  --   --    < > = values in this interval not displayed.     Estimated Creatinine Clearance: 44.7 mL/min (A) (by C-G formula based on SCr of 1.34 mg/dL (H)).   Assessment: 75yom with Hx Mechanical MVR and Afib - on warfarin PTA 2.5mg  MT and 5mg  all other days  Admit INR high at 8 and hgb low 6.3 > PRBC and Vit K  S/p endoscopy - non bleeding lesion treated and ok to begin low dose heparin and resume warfarin   Heparin level subtherapeutic (0.15) on gtt at 800 units/hr. No issues with line or bleeding reported per RN. Hgb 7.3, INR 1.6  Goal of Therapy:  INR 2.5-3.5 Heparin level 0.3-0.5 units/ml Monitor platelets by anticoagulation protocol: Yes   Plan:  Increase heparin drip to 900 uts/hr  F/u 8 hr heparin level  07/02/21, PharmD, BCPS Please see amion for complete clinical pharmacist phone list 06/30/2021 3:34 AM

## 2021-06-30 NOTE — Evaluation (Signed)
Physical Therapy Evaluation Patient Details Name: [name] MRN: [id number] DOB: [birth year] Today's Date: 06/30/2021  History of Present Illness  Pt adm 12/23 for GI bleed. PMH - afib, endocarditis, chf, pacer, ckd, chronic back pain, HTN  Clinical Impression  Pt doing well with mobility and no further PT needed.  Ready for dc from PT standpoint.        Recommendations for follow up therapy are one component of a multi-disciplinary discharge planning process, led by the attending physician.  Recommendations may be updated based on patient status, additional functional criteria and insurance authorization.  Follow Up Recommendations No PT follow up    Assistance Recommended at Discharge None  Functional Status Assessment Patient has not had a recent decline in their functional status  Equipment Recommendations  None recommended by PT    Recommendations for Other Services       Precautions / Restrictions Precautions Precautions: None Restrictions Weight Bearing Restrictions: No      Mobility  Bed Mobility Overal bed mobility: Independent                  Transfers Overall transfer level: Independent Equipment used: None                    Ambulation/Gait Ambulation/Gait assistance: Independent Gait Distance (Feet): 500 Feet Assistive device: None Gait Pattern/deviations: WFL(Within Functional Limits) Gait velocity: normal Gait velocity interpretation: >4.37 ft/sec, indicative of normal walking speed   General Gait Details: Steady gait without difficulties  Stairs            Wheelchair Mobility    Modified Rankin (Stroke Patients Only)       Balance Overall balance assessment: Independent                                           Pertinent Vitals/Pain Pain Assessment: No/denies pain    Home Living Family/patient expects to be discharged to:: Private residence Living Arrangements: Children;Other relatives  (grandchildren) Available Help at Discharge: Family;Available 24 hours/day Type of Home: House Home Access: Stairs to enter Entrance Stairs-Rails: Right Entrance Stairs-Number of Steps: 3-4   Home Layout: One level Home Equipment: None      Prior Function Prior Level of Function : Independent/Modified Independent;Driving             Mobility Comments: No assistive device       Hand Dominance        Extremity/Trunk Assessment   Upper Extremity Assessment Upper Extremity Assessment: Overall WFL for tasks assessed    Lower Extremity Assessment Lower Extremity Assessment: Overall WFL for tasks assessed    Cervical / Trunk Assessment Cervical / Trunk Assessment: Normal  Communication   Communication: No difficulties  Cognition Arousal/Alertness: Awake/alert Behavior During Therapy: WFL for tasks assessed/performed Overall Cognitive Status: Within Functional Limits for tasks assessed                                          General Comments      Exercises     Assessment/Plan    PT Assessment Patient does not need any further PT services  PT Problem List         PT Treatment Interventions      PT Goals (Current goals can  be found in the Care Plan section)  Acute Rehab PT Goals PT Goal Formulation: All assessment and education complete, DC therapy    Frequency     Barriers to discharge        Co-evaluation               AM-PAC PT "6 Clicks" Mobility  Outcome Measure Help needed turning from your back to your side while in a flat bed without using bedrails?: None Help needed moving from lying on your back to sitting on the side of a flat bed without using bedrails?: None Help needed moving to and from a bed to a chair (including a wheelchair)?: None Help needed standing up from a chair using your arms (e.g., wheelchair or bedside chair)?: None Help needed to walk in hospital room?: None Help needed climbing 3-5 steps with a  railing? : None 6 Click Score: 24    End of Session   Activity Tolerance: Patient tolerated treatment well Patient left: in chair;with call bell/phone within reach Nurse Communication: Mobility status PT Visit Diagnosis: Other abnormalities of gait and mobility (R26.89)    Time: 1001-1014 PT Time Calculation (min) (ACUTE ONLY): 13 min   Charges:   PT Evaluation $PT Eval Low Complexity: 1 Low          [facility] PT Acute Rehabilitation Services Pager [phone] Office [phone]   Angelina Ok [facility] 06/30/2021, 10:24 AM

## 2021-06-30 NOTE — Discharge Instructions (Addendum)
Dear [name],  You were hospitalized for a GI Bleed and elevated INR level.   For your GI bleed: please take protonix 40 mg twice daily for 2 months, after 2 months you will just need to take the protonix once daily. If you have any further dark, bloody bowel movements you should call your doctor or go to the emergency room.   For your coumadin: Please take coumadin 5 mg everyday until your INR check on 07/08/21. You will also need to inject 80 mg of lovenox twice daily until your INR check. You likely will be able to stop the lovenox after that, however, it is really important to do this twice a day, everyday until your hospital follow up.   If you have any questions or concerns, call our clinic at [phone] or after hours call [phone] and ask for the internal medicine resident on call.

## 2021-06-30 NOTE — Plan of Care (Signed)
°  Problem: Education: Goal: Knowledge of General Education information will improve Description: Including pain rating scale, medication(s)/side effects and non-pharmacologic comfort measures 06/30/2021 1944 by Marlyn Corporal, RN Outcome: Adequate for Discharge 06/30/2021 0556 by Marlyn Corporal, RN Outcome: Progressing   Problem: Health Behavior/Discharge Planning: Goal: Ability to manage health-related needs will improve 06/30/2021 1944 by Marlyn Corporal, RN Outcome: Adequate for Discharge 06/30/2021 0556 by Marlyn Corporal, RN Outcome: Progressing   Problem: Clinical Measurements: Goal: Ability to maintain clinical measurements within normal limits will improve 06/30/2021 1944 by Marlyn Corporal, RN Outcome: Adequate for Discharge 06/30/2021 0556 by Marlyn Corporal, RN Outcome: Progressing Goal: Will remain free from infection 06/30/2021 1944 by Marlyn Corporal, RN Outcome: Adequate for Discharge 06/30/2021 0556 by Marlyn Corporal, RN Outcome: Progressing Goal: Diagnostic test results will improve 06/30/2021 1944 by Marlyn Corporal, RN Outcome: Adequate for Discharge 06/30/2021 0556 by Marlyn Corporal, RN Outcome: Progressing Goal: Respiratory complications will improve 06/30/2021 1944 by Marlyn Corporal, RN Outcome: Adequate for Discharge 06/30/2021 0556 by Marlyn Corporal, RN Outcome: Progressing Goal: Cardiovascular complication will be avoided 06/30/2021 1944 by Marlyn Corporal, RN Outcome: Adequate for Discharge 06/30/2021 0556 by Marlyn Corporal, RN Outcome: Progressing   Problem: Activity: Goal: Risk for activity intolerance will decrease 06/30/2021 1944 by Marlyn Corporal, RN Outcome: Adequate for Discharge 06/30/2021 0556 by Marlyn Corporal, RN Outcome: Progressing   Problem: Nutrition: Goal: Adequate nutrition will be maintained 06/30/2021 1944 by Marlyn Corporal, RN Outcome: Adequate for Discharge 06/30/2021 0556 by Marlyn Corporal, RN Outcome: Progressing   Problem: Coping: Goal: Level of anxiety  will decrease 06/30/2021 1944 by Marlyn Corporal, RN Outcome: Adequate for Discharge 06/30/2021 0556 by Marlyn Corporal, RN Outcome: Progressing   Problem: Elimination: Goal: Will not experience complications related to bowel motility 06/30/2021 1944 by Marlyn Corporal, RN Outcome: Adequate for Discharge 06/30/2021 0556 by Marlyn Corporal, RN Outcome: Progressing Goal: Will not experience complications related to urinary retention 06/30/2021 1944 by Marlyn Corporal, RN Outcome: Adequate for Discharge 06/30/2021 0556 by Marlyn Corporal, RN Outcome: Progressing   Problem: Pain Managment: Goal: General experience of comfort will improve 06/30/2021 1944 by Marlyn Corporal, RN Outcome: Adequate for Discharge 06/30/2021 0556 by Marlyn Corporal, RN Outcome: Progressing   Problem: Safety: Goal: Ability to remain free from injury will improve 06/30/2021 1944 by Marlyn Corporal, RN Outcome: Adequate for Discharge 06/30/2021 0556 by Marlyn Corporal, RN Outcome: Progressing   Problem: Skin Integrity: Goal: Risk for impaired skin integrity will decrease 06/30/2021 1944 by Marlyn Corporal, RN Outcome: Adequate for Discharge 06/30/2021 0556 by Marlyn Corporal, RN Outcome: Progressing

## 2021-06-30 NOTE — Hospital Course (Addendum)
[name] is a 75 y.o. with a pertinent PMH of Afib on coumadin, systolic HF, HTN, BPH, and CKD III, who presented with dark stools and fatigue and admitted for acute GI bleed.   #Acute GI bleed Presented with 1 week of dark stools and hypotension. Hb 6.3 on admission and patient was given 2u of blood and started on protonix. Of note, patient's last colonoscopy was in 2015 which was normal, aside from diverticula. 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 Had small bowel enteroscopy with GI on 12/25 which showed gastritis and a single non-bleeding angiodysplastic lesion in the proximal jejunum that was treated with APC- pathology still pending.  Hb initially improved to 8.1 (s/p 2u PRBC), however, dropped to 7.3 on 12/26. Ferritin level low at 14, iron low at 17, and iron sat 5%. Gave the patient 1u PRBC and IV iron 250 mg prior to discharge- as Ganzion iron deficit was calculated to be ~1300 mg deficit.  Will continue PPI 40 mg bid x 2 months, followed by once daily therapy. Patient able to work with PT after procedure and did well with mobility with no equipment or follow up needed.   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 PT 69.4 and INR 8.4 on admission; patient was given 10 mg of vitamin K. Enteroscopy performed as noted above, when INR <2. Patient was started on heparin, and will bridge to coumadin with Lovenox at home. Will require Lovenox 80 mg bid with 5 mg coumadin to be taken daily. INR check at hospital follow up on 07/08/21.  Patient also takes aspirin 81 mg, which was held on discharge in the setting of GI bleeding. Please readdress risk vs benefits of resuming aspirin at hospital follow up.   #CKD III Cr 1.7 on the day of discharge. Recommend close follow up of BMP.   #Nonischemic  cardiomyopathy  #S/p mechanical MVR #HFrEF, EF 35-40% #Hypertension Last echo in 2021 with EF 35-40% and severe dyskinesis of LV. On carvedilol 6.25 mg bid and lisinopril 2.5 mg at home and resumed these medications during hospitalization.   #BPH Resumed home flomax 0.8 mg daily

## 2021-06-30 NOTE — Progress Notes (Signed)
RN went over d/c summary with pt including Lovenox teaching. Lovenox teaching kit given to pt. NT removing PIVs. Belongings with pt. Pt's family transporting pt home.

## 2021-06-30 NOTE — Discharge Summary (Signed)
Name: [name] MRN: [id number] DOB: 12/31/1945 75 y.o. PCP: [name], MD  Date of Admission: 06/27/2021  5:18 PM Date of Discharge:  06/30/2021 Attending Physician: Dr. Jimmye Norman  DISCHARGE DIAGNOSIS:  Primary Problem: Acute GI bleeding   Hospital Problems: Principal Problem:   Acute GI bleeding Active Problems:   GI bleed  Acute GI bleed Supratherapeutic INR Afib Mechanical MV on Coumadin CKD III Nonischemic cardiomyopathy HFrEF, EF 35-40% BPH   DISCHARGE MEDICATIONS:   Allergies as of 06/30/2021   No Known Allergies      Medication List     STOP taking these medications    aspirin EC 81 MG tablet   colchicine 0.6 MG tablet       TAKE these medications    acetaminophen 500 MG tablet Commonly known as: TYLENOL Take 1,000 mg by mouth every 6 (six) hours as needed (pain).   carvedilol 6.25 MG tablet Commonly known as: COREG Take 1 tablet (6.25 mg total) by mouth 2 (two) times daily with a meal.   diclofenac Sodium 1 % Gel Commonly known as: VOLTAREN APPLY 4 G TOPICALLY 4 TIMES DAILY   enoxaparin 80 MG/0.8ML injection Commonly known as: LOVENOX Inject 0.8 mLs (80 mg total) into the skin every 12 (twelve) hours for 10 days.   enoxaparin Kit Commonly known as: LOVENOX 1 kit by Does not apply route once for 1 dose.   lisinopril 2.5 MG tablet Commonly known as: ZESTRIL TAKE 1 TABLET BY MOUTH EVERY DAY **NEED APPT FOR FURTHER REFILLS** What changed: See the new instructions.   pantoprazole 40 MG tablet Commonly known as: PROTONIX Take 1 tablet (40 mg total) by mouth 2 (two) times daily before a meal.   tamsulosin 0.4 MG Caps capsule Commonly known as: FLOMAX Take 2 capsules (0.8 mg total) by mouth daily after breakfast.   warfarin 5 MG tablet Commonly known as: COUMADIN Take as directed. If you are unsure how to take this medication, talk to your nurse or doctor. Original instructions: Take one tablet (5 mg) by mouth daily until  INR check on 07/08/21 What changed: additional instructions        DISPOSITION AND FOLLOW-UP:  Mr.Skylur T Antillon was discharged from [facility] in Stable condition. At the hospital follow up visit please address:  Follow-up Recommendations:  Please address: GI bleed: Protonix 40 mg bid, ensure no further bleeding. Check CBC at follow up and iron studies in the future Supratherapeutic INR: Bridging with lovenox twice daily and taking coumadin 5 mg daily. Check INR and adjust coumadin dose as needed; reassess need for aspirin therapy  CKD: Follow up Cr  Consults: Gastroenterology Labs: Basic Metabolic Profile, CBC, and INR and iron studies Studies: none Medications: Lovenox 40 mg injection twice daily  Warfarin 5 mg daily until INR check 07/08/21- likely will need dose adjusted  Protonix 40 mg twice daily x 2 months, followed by once daily   STOP aspirin 81 mg- please readdress at hospital follow up (discontinued in setting of GI bleed, risks seem to outweigh benefits)  Follow-up Appointments:   Follow-up Information     [name], MD. Go on 07/08/2021.   Specialty: Internal Medicine Why: The clinic will call you with the appt information Contact information: [address] [place] [address] [phone]         [name], MD .   Specialty: Cardiology Contact information: [phone] [address] [place] [address] [phone]  HOSPITAL COURSE:  Patient Summary: [name] is a 75 y.o. with a pertinent PMH of Afib on coumadin, systolic HF, HTN, BPH, and CKD III, who presented with dark stools and fatigue and admitted for acute GI bleed.   #Acute GI bleed Presented with 1 week of dark stools and hypotension. Hb 6.3 on admission and patient was given 2u of blood and started on protonix. Of note, patient's last colonoscopy was in 2015 which was normal, aside from diverticula. 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 Had small bowel enteroscopy with GI on 12/25 which showed gastritis and a single non-bleeding angiodysplastic lesion in the proximal jejunum that was treated with APC- pathology still pending.  Hb initially improved to 8.1 (s/p 2u PRBC), however, dropped to 7.3 on 12/26. Ferritin level low at 14, iron low at 17, and iron sat 5%. Gave the patient 1u PRBC and IV iron 250 mg prior to discharge- as Ganzion iron deficit was calculated to be ~1300 mg deficit.  Will continue PPI 40 mg bid x 2 months, followed by once daily therapy. Patient able to work with PT after procedure and did well with mobility with no equipment or follow up needed.   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 PT 69.4 and INR 8.4 on admission; patient was given 10 mg of vitamin K. Enteroscopy performed as noted above, when INR <2. Patient was started on heparin, and will bridge to coumadin with Lovenox at home. Will require Lovenox 80 mg bid with 5 mg coumadin to be taken daily. INR check at hospital follow up on 07/08/21.  Patient also takes aspirin 81 mg, which was held on discharge in the setting of GI bleeding. Please readdress risk vs benefits of resuming aspirin at hospital follow up.   #CKD III Cr 1.7 on the day of discharge. Recommend close follow up of BMP.   #Nonischemic cardiomyopathy  #S/p mechanical MVR #HFrEF, EF 35-40% #Hypertension Last echo in 2021 with EF 35-40% and severe dyskinesis of LV. On carvedilol 6.25 mg bid and lisinopril 2.5 mg at home and resumed these medications during hospitalization.   #BPH Resumed home flomax 0.8 mg daily    DISCHARGE INSTRUCTIONS:  Dear [name],  You were hospitalized for a GI Bleed and elevated INR level.   For your GI bleed: please take  protonix 40 mg twice daily for 2 months, after 2 months you will just need to take the protonix once daily. If you have any further dark, bloody bowel movements you should call your doctor or go to the emergency room.   For your coumadin: Please take coumadin 5 mg everyday until your INR check on 07/08/21. You will also need to inject 80 mg of lovenox twice daily until your INR check. You likely will be able to stop the lovenox after that, however, it is really important to do this twice a day, everyday until your hospital follow up.   If you have any questions or concerns, call our clinic at [phone] or after hours call [phone] and ask for the internal medicine resident on call.   SUBJECTIVE:  MICHAELPAUL APO was seen and evaluated on the day of discharge. He reports that he is feeling better and was able to ambulate the halls with PT this morning. Had  one dark bowel movement last night, but none this morning.   Discharge Vitals:   BP 100/62    Pulse 84    Temp 98.5 F (36.9 C) (Oral)    Resp 20    Ht '5\' 8"'  (1.727 m)    Wt 79.2 kg    SpO2 100%    BMI 26.55 kg/m   OBJECTIVE:  General: Pleasant, elderly male in bedside chair. No acute distress. CV: Irregularly irregular with mechanical valve sound.  Pulmonary: Lungs CTAB. Normal effort. No wheezing or rales. Abdominal: Soft, nontender, nondistended. Normal bowel sounds. Extremities: Palpable radial and DP pulses.  Skin: Warm and dry. Bilateral tinea pedis.  Neuro: A&Ox3. Moves all extremities. No focal deficit. Psych: Normal mood and affect    Pertinent Labs, Studies, and Procedures:  CBC Latest Ref Rng & Units 06/30/2021 06/29/2021 06/29/2021  WBC 4.0 - 10.5 K/uL 7.1 7.7 8.3  Hemoglobin 13.0 - 17.0 g/dL 7.3(L) 7.5(L) 8.1(L)  Hematocrit 39.0 - 52.0 % 24.0(L) 24.2(L) 26.3(L)  Platelets 150 - 400 K/uL 306 307 299    CMP Latest Ref Rng & Units 06/30/2021 06/29/2021 06/28/2021  Glucose 70 - 99 mg/dL 95 89 85  BUN 8 - 23 mg/dL 16  18 25(H)  Creatinine 0.61 - 1.24 mg/dL 1.72(H) 1.34(H) 1.32(H)  Sodium 135 - 145 mmol/L 135 136 135  Potassium 3.5 - 5.1 mmol/L 3.5 3.8 3.6  Chloride 98 - 111 mmol/L 110 108 107  CO2 22 - 32 mmol/L 19(L) 21(L) 22  Calcium 8.9 - 10.3 mg/dL 8.2(L) 8.4(L) 8.0(L)  Total Protein 6.5 - 8.1 g/dL - - -  Total Bilirubin 0.3 - 1.2 mg/dL - - -  Alkaline Phos 38 - 126 U/L - - -  AST 15 - 41 U/L - - -  ALT 0 - 44 U/L - - -    No results found.   Signed: Buddy Duty, D.O.  Internal Medicine Resident, PGY-1 [facility]  Pager: [phone] 12:15 PM, 06/30/2021

## 2021-06-30 NOTE — Plan of Care (Signed)

## 2021-07-01 LAB — TYPE AND SCREEN
ABO/RH(D): O POS
Antibody Screen: NEGATIVE
Unit division: 0
Unit division: 0
Unit division: 0

## 2021-07-01 LAB — BPAM RBC
Blood Product Expiration Date: 202301142359
Blood Product Expiration Date: 202301142359
Blood Product Expiration Date: 202301162359
ISSUE DATE / TIME: 202212232009
ISSUE DATE / TIME: 202212240101
ISSUE DATE / TIME: 202212261421
Unit Type and Rh: 5100
Unit Type and Rh: 5100
Unit Type and Rh: 5100

## 2021-07-02 ENCOUNTER — Encounter: Payer: Self-pay | Admitting: Gastroenterology

## 2021-07-02 LAB — SURGICAL PATHOLOGY

## 2021-07-09 ENCOUNTER — Encounter: Payer: Self-pay | Admitting: Internal Medicine

## 2021-07-09 ENCOUNTER — Ambulatory Visit (INDEPENDENT_AMBULATORY_CARE_PROVIDER_SITE_OTHER): Payer: 59 | Admitting: Internal Medicine

## 2021-07-09 VITALS — BP 109/59 | HR 70 | Temp 97.8°F | Wt 191.0 lb

## 2021-07-09 DIAGNOSIS — K922 Gastrointestinal hemorrhage, unspecified: Secondary | ICD-10-CM | POA: Diagnosis not present

## 2021-07-09 DIAGNOSIS — I5022 Chronic systolic (congestive) heart failure: Secondary | ICD-10-CM | POA: Diagnosis not present

## 2021-07-09 LAB — HEMOGLOBIN AND HEMATOCRIT, BLOOD
HCT: 28.4 % — ABNORMAL LOW (ref 39.0–52.0)
Hemoglobin: 8.3 g/dL — ABNORMAL LOW (ref 13.0–17.0)

## 2021-07-09 LAB — POCT INR: INR: 3.1 — AB (ref 2.0–3.0)

## 2021-07-09 NOTE — Assessment & Plan Note (Addendum)
Patient seen for hospital follow up appt. Reports after discharge, 4-5 days ago, patient has noticed worsening lower extremity swelling. Denies orthopnea, has had SOB on exertion prior to hospitalization with anemia likely contributing. 1-2+ pitting edema to above the knee and crackles RLL, though oxygenation 100% on RA. Patient likely in mild heart failure exacerbation with evidence of volume overload on exam, though with BP today only 109/59, would be hesitant to start lasix in this elderly patient who is a fall risk. Patient has a follow up appt with cardiology on 07/14/21, will hold off on starting lasix given no pulmonary symptoms at this time.   Plan: -Follow up with cardiology 07/14/21, appt information provided in AVS

## 2021-07-09 NOTE — Patient Instructions (Signed)
Thank you, [name] for allowing [place] to provide your care today. Today we discussed:  Gastrointestinal bleed on warfarin: I am glad to hear you have had no further bleeding in your stool.  Your warfarin dosing looks good, we will keep your dose at warfarin 5 mg once daily.  Continue to take your pantoprazole or Protonix 40 mg twice a day to protect your stomach.  Lower extremity swelling: There is fluid building up in your legs because of your heart failure.  Normally we would give Lasix to pull some of the fluid off however your blood pressure is low, therefore we will hold off on Lasix.  It will be important for you to attend your cardiologist or heart doctor appointment.  Cardiology appointment: January 9th at [facility]  I have ordered the following labs for you:   Lab Orders         Hemoglobin and Hematocrit, Blood         POCT INR      My Chart Access: [email or web address]  Please follow-up in in 3 weeks.  Please make sure to arrive 15 minutes prior to your next appointment. If you arrive late, you may be asked to reschedule.    We look forward to seeing you next time. Please call our clinic at [phone] if you have any questions or concerns. The best time to call is Monday-Friday from 9am-4pm, but there is someone available 24/7. If after hours or the weekend, call the main hospital number and ask for the Internal Medicine Resident On-Call. If you need medication refills, please notify your pharmacy one week in advance and they will send [place] a request.   Thank you for letting us take part in your care. Wishing you the best!  Wayland Denis, MD 07/09/2021, 12:00 PM IM Resident, PGY-1

## 2021-07-09 NOTE — Assessment & Plan Note (Signed)
Patient presents for hospital follow up for recent hospitalization 12/23 to 12/26 for acute GI bleed, in the setting of anticoagulation use for mechanical MV, likely from angiodysplastic lesion in the proximal jejunum that was treated with APC. Patient was discharged on coumadin with Lovenox bridge.  Patient reports no further hematochezia, melena. No lightheadedness or dizziness. Reports was shaving yesterday and cut himself and this took a long time to stop bleeding, therefore he stopped the Lovenox and warfarin, did not taken his dose this morning. INR today 3.1, which is at goal for patient with mechanical MV. Will discontinue Lovenox injections, and continue Warfarin at current dose. Discussed with Dr. [name] who is in agreement with plan. Hemoglobin today 8.3 which is improved from hospital discharge, suggesting no further GI bleeding.   Plan: -discontinue Lovenox injections -continue Warfarin 5mg  -follow up in Coumadin clinic with Dr. 

## 2021-07-09 NOTE — Progress Notes (Signed)
CC: hospital follow up appt  HPI:  [name] is a 76 y.o. male with a past medical history stated below and presents today for hospital follow up appt. Please see problem based assessment and plan for additional details.  Past Medical History:  Diagnosis Date   Anemia 01/29/2016   Atrial fibrillation ([place])    post op.  s/p dc-cv 04/2008. 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  a- Echo 07/2008 Ef 55% mild peroprosthetic MVR with High transmitral gradient (mean 14).  b- normal coronaries by cath 12/2007   BPH (benign prostatic hyperplasia)    Cardiac resynchronization therapy pacemaker (CRT-P) in place 06/22/2011   CHF (congestive heart failure) (H[facility])    EF 35-40 % 2011 due to valvular disease and diastolic dysfunction   Chronic back pain    CKD (chronic kidney disease), stage III ([place])    COVID-19 virus infection 07/24/2020   Tested positive on July 16, 2020   Dysphagia    with normal barium swallow 09/2008   Endocarditis    GERD (gastroesophageal reflux disease)    Heavy alcohol use    history   History of atrial flutter 03/13/2008   History of atrial flutter - resolved.    History of GI bleed    Hypertension    IV drug abuse ([place])    history of   Lower GI bleed 01/2016   Pacemaker 2010    Current Outpatient Medications on File Prior to Visit  Medication Sig Dispense Refill   acetaminophen (TYLENOL) 500 MG tablet Take 1,000 mg by mouth every 6 (six) hours as needed (pain).     carvedilol (COREG) 6.25 MG tablet Take 1 tablet (6.25 mg total) by mouth 2 (two) times daily with a meal. 180 tablet 3   diclofenac Sodium (VOLTAREN) 1 % GEL APPLY 4 G TOPICALLY 4 TIMES DAILY (Patient not taking: Reported on 06/27/2021) 400 g 11   lisinopril (ZESTRIL) 2.5 MG tablet TAKE 1 TABLET BY MOUTH EVERY DAY **NEED APPT FOR FURTHER  REFILLS** (Patient taking differently: Take 2.5 mg by mouth every morning.) 60 tablet 0   pantoprazole (PROTONIX) 40 MG tablet Take 1 tablet (40 mg total) by mouth 2 (two) times daily before a meal. 60 tablet 0   tamsulosin (FLOMAX) 0.4 MG CAPS capsule Take 2 capsules (0.8 mg total) by mouth daily after breakfast. 180 capsule 3   warfarin (COUMADIN) 5 MG tablet Take one tablet (5 mg) by mouth daily until INR check on 07/08/21 30 tablet 0   No current facility-administered medications on file prior to visit.    Family History  Problem Relation Age of Onset   Coronary artery disease Mother    Cancer Father        GI   Cancer Brother        Lung    Social History   Tobacco Use   Smoking status: Former    Years: 0.00    Types: Cigarettes   Smokeless tobacco: Never  Vaping Use   Vaping Use: Never used  Substance Use Topics   Alcohol use: No    Alcohol/week: 0.0 standard drinks   Drug use: No     Review of Systems: ROS negative except for what is noted on the assessment and plan.  Vitals:   07/09/21 1037  BP: (!) 109/59  Pulse: 70  Temp: 97.8 F (36.6 C)  TempSrc: Oral  SpO2: 100%  Weight: 191 lb (86.6 kg)     Physical Exam: General: Well appearing elderly male, NAD HENT: normocephalic, atraumatic EYES: conjunctiva non-erythematous, no scleral icterus CV: regular rate, normal rhythm, no murmurs, rubs, gallops. Mechanical heart valve closing click Pulmonary: normal work of breathing on RA, crackles RLL  Abdominal: non-distended, soft, non-tender to palpation, normal BS Skin: Warm and dry, no rashes or lesions Neurological: MS: awake, alert and oriented x3, normal speech and fund of knowledge Motor: moves all extremities antigravity Psych: normal affect    Assessment & Plan:   See Encounters Tab for problem based charting.  Patient discussed with Dr. Illene Regulus, M.D. [facility], PGY-1 Pager: [phone] Date 07/09/2021 Time 5:58  PM

## 2021-07-12 NOTE — Progress Notes (Signed)
Internal Medicine Clinic Attending ° °I saw and evaluated the patient.  I personally confirmed the key portions of the history and exam documented by Dr. Zinoviev and I reviewed pertinent patient test results.  The assessment, diagnosis, and plan were formulated together and I agree with the documentation in the resident’s note.  °

## 2021-07-14 ENCOUNTER — Encounter: Payer: Self-pay | Admitting: Internal Medicine

## 2021-07-14 ENCOUNTER — Ambulatory Visit (INDEPENDENT_AMBULATORY_CARE_PROVIDER_SITE_OTHER): Payer: 59 | Admitting: Internal Medicine

## 2021-07-14 ENCOUNTER — Telehealth: Payer: Self-pay

## 2021-07-14 ENCOUNTER — Other Ambulatory Visit: Payer: Self-pay

## 2021-07-14 VITALS — BP 106/64 | HR 83 | Ht 68.0 in | Wt 200.0 lb

## 2021-07-14 DIAGNOSIS — I5022 Chronic systolic (congestive) heart failure: Secondary | ICD-10-CM

## 2021-07-14 MED ORDER — TORSEMIDE 20 MG PO TABS: 20.0000 mg | ORAL_TABLET | Freq: Every day | ORAL | 1 refills | Status: DC

## 2021-07-14 NOTE — Progress Notes (Signed)
Patient Care Team: [name], MD as PCP - General [name], MD as PCP - Electrophysiology (Cardiology)   HPI  [name] is a 76 y.o. male Seen in followup for CRT-P  Implanted September 2012 for profound first degree AV block in the setting of a modest valvular cardiomyopathy ejection fraction 35-40%.   Device at [facility] with Gen change 6/19--at that time he had not been seen for more than 3 years and he has been in atrial fibrillation for the previous 1 year.  At device generator replacement he was programmed VVIR.  Atrial Fibrillation Is Now Permanent.  He is s/p mechanical MVR for endocarditis associated with IVDA.  Hospitalized 12/22 for GI bleeding thought probably angiodysplastic lesion in the proximal jejunum and was discharged back on warfarin   DATE TEST EF   6/14 Echo   50 %   6/19 Echo   35-40 %   1/21 Echo 35-40 % LA severely dilated   .  Date Cr K Hgb  1/18   11.9  6/19 1.28 3.9    9/19  1.4 3.3 13.0  12/22 1.72 3.5 7.3  1/23   8.3    He has been recovering well after his hospital visit 06/2021. However, in the past few days, he noticed fluid in his bilateral LE. Along with the edema, he also reports fatigue and shortness of breath. When he goes to lie in bed, he has to sit up to catch his breath. He does not consume a lot of soda or added salt.   He believes his coumadin is causing him to bleed easily. When he shaves, he notices increased bleeding if he cuts himself but this is better temporally related to the discontinuation of his bridging Lovenox  The patient denies chest pain.  There have been no palpitations, lightheadedness or syncope.  Complains of shortness of breath, fatigue, LE edema, orthopnea, and bleeding.   Past Medical History:  Diagnosis Date   Acute GI bleeding 06/27/2021   Anemia 01/29/2016   Atrial fibrillation ([place])    post op.  s/p dc-cv 04/2008. previously on amiodarone   Atrial flutter (H[facility])    atypical   AV block,  1st degree    .450 msec--progressive   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  a- Echo 07/2008 Ef 55% mild peroprosthetic MVR with High transmitral gradient (mean 14).  b- normal coronaries by cath 12/2007   BPH (benign prostatic hyperplasia)    Cardiac resynchronization therapy pacemaker (CRT-P) in place 06/22/2011   CHF (congestive heart failure) (HCC)    EF 35-40 % 2011 due to valvular disease and diastolic dysfunction   Chronic back pain    CKD (chronic kidney disease), stage III ([place])    COVID-19 virus infection 07/24/2020   Tested positive on July 16, 2020   Dysphagia    with normal barium swallow 09/2008   Endocarditis    GERD (gastroesophageal reflux disease)    Heavy alcohol use    history   History of atrial flutter 03/13/2008   History of atrial flutter - resolved.    History of GI bleed    Hypertension    IV drug abuse ([place])    history of   Lower GI bleed 01/2016   Pacemaker 2010    Past Surgical History:  Procedure Laterality Date   BIOPSY  06/29/2021   Procedure: BIOPSY;  Surgeon: Irving Copas., MD;  Location: [facility];  Service: Gastroenterology;;   [place] N/A 12/13/2017   Procedure: BIV PACEMAKER GENERATOR CHANGEOUT;  Surgeon: [name], MD;  Location: [facility];  Service: Cardiovascular;  Laterality: N/A;   CARDIAC VALVE REPLACEMENT     mitral valve, st jude mechanical    COLONOSCOPY N/A 03/26/2014   Procedure: COLONOSCOPY;  Surgeon: Milus Banister, MD;  Location: [facility];  Service: Endoscopy;  Laterality: N/A;   ENTEROSCOPY N/A 03/28/2014   Procedure: ENTEROSCOPY;  Surgeon: Milus Banister, MD;  Location: [facility];  Service: Endoscopy;  Laterality: N/A;   ENTEROSCOPY N/A 06/29/2021   Procedure: ENTEROSCOPY;  Surgeon: Rush Landmark Telford Nab., MD;  Location: [facility];  Service: Gastroenterology;  Laterality: N/A;   ESOPHAGOGASTRODUODENOSCOPY N/A 03/25/2014    Procedure: ESOPHAGOGASTRODUODENOSCOPY (EGD);  Surgeon: Gatha Mayer, MD;  Location[facility];  Service: Endoscopy;  Laterality: N/A;   GIVENS CAPSULE STUDY N/A 03/26/2014   Procedure: GIVENS CAPSULE STUDY;  Surgeon: Milus Banister, MD;  Location: [facility];  Service: Endoscopy;  Laterality: N/A;   HOT HEMOSTASIS N/A 06/29/2021   Procedure: HOT HEMOSTASIS (ARGON PLASMA COAGULATION/BICAP);  Surgeon: Irving Copas., MD;  Location: [facility];  Service: Gastroenterology;  Laterality: N/A;   PACEMAKER INSERTION     inserted about 4-5 years ago   [place]  06/29/2021   Procedure: SUBMUCOSAL TATTOO INJECTION;  Surgeon: Irving Copas., MD;  Location: [facility];  Service: Gastroenterology;;    Current Outpatient Medications  Medication Sig Dispense Refill   acetaminophen (TYLENOL) 500 MG tablet Take 1,000 mg by mouth every 6 (six) hours as needed (pain).     carvedilol (COREG) 6.25 MG tablet Take 1 tablet (6.25 mg total) by mouth 2 (two) times daily with a meal. 180 tablet 3   lisinopril (ZESTRIL) 2.5 MG tablet TAKE 1 TABLET BY MOUTH EVERY DAY **NEED APPT FOR FURTHER REFILLS** (Patient taking differently: Take 2.5 mg by mouth every morning.) 60 tablet 0   pantoprazole (PROTONIX) 40 MG tablet Take 1 tablet (40 mg total) by mouth 2 (two) times daily before a meal. 60 tablet 0   tamsulosin (FLOMAX) 0.4 MG CAPS capsule Take 2 capsules (0.8 mg total) by mouth daily after breakfast. 180 capsule 3   torsemide (DEMADEX) 20 MG tablet Take 1 tablet (20 mg total) by mouth daily. 90 tablet 1   warfarin (COUMADIN) 5 MG tablet Take one tablet (5 mg) by mouth daily until INR check on 07/08/21 30 tablet 0   diclofenac Sodium (VOLTAREN) 1 % GEL APPLY 4 G TOPICALLY 4 TIMES DAILY (Patient not taking: Reported on 06/27/2021) 400 g 11   No current facility-administered medications for this visit.    No Known Allergies  Review of Systems negative except from HPI and  PMH  Physical Exam BP 106/64    Pulse 83    Ht 5\' 8"  (1.727 m)    Wt 200 lb (90.7 kg)    SpO2 96%    BMI 30.41 kg/m  Well developed and well nourished in no acute distress HENT normal Neck supple with JVP-angle of the jaw  Diffuse crackles halfway up Device pocket well healed; without hematoma or erythema.  There is no tethering  Regular rate and rhythm, no gallop No murmur Abd-soft with active BS No Clubbing cyanosis 2-3+ edema edema Skin-warm and dry A & Oriented  Grossly normal sensory and motor function  ECG afib with intermittent Vpacing   Assessment and  Plan  Nonischemic cardiomyopathy  Congestive heart failure acute/chronic systolic  GI bleeding  Peripheral edema   Mitral valve replacement-Saint Jude  Atrial fibrillation-permanent  Renal insufficiency grade 3  Pacemaker-CRT-St. Jude generator replaced 6/19    Acute on chronic congestive heart failure with significant peripheral edema, pulmonary rales and orthopnea.  We will begin him on torsemide 20 mg today.  We will check with him tomorrow if this is insufficient we will increase it to 40 mg.  We will continue it twice daily at his effective dose for 3 days and then daily thereafter.  Will have him play close attention to his low blood pressure and indeed we will have him hold his lisinopril for the first 72 hours  We spent more than 50% of our >25 min visit in face to face counseling regarding the above   I,Mykaella Javier,acting as a scribe for Virl Axe, MD.,have documented all relevant documentation on the behalf of Virl Axe, MD,as directed by  Virl Axe, MD while in the presence of Virl Axe, MD.  I, Virl Axe, MD, have reviewed all documentation for this visit. The documentation on 07/14/21 for the exam, diagnosis, procedures, and orders are all accurate and complete.

## 2021-07-14 NOTE — Patient Instructions (Addendum)
Medication Instructions:                                                               Your physician has recommended you make the following change in your medication:   **  Begin Torsemide 20mg  - 1 tablet by mouth daily.  *If you need a refill on your cardiac medications before your next appointment, please call your pharmacy*   Lab Work:  None ordered.   If you have labs (blood work) drawn today and your tests are completely normal, you will receive your results only by: MyChart Message (if you have MyChart) OR A paper copy in the mail If you have any lab test that is abnormal or we need to change your treatment, we will call you to review the results.   Testing/Procedures: None ordered.    Follow-Up: At [facility], you and your health needs are our priority.  As part of our continuing mission to provide you with exceptional heart care, we have created designated Provider Care Teams.  These Care Teams include your primary Cardiologist (physician) and Advanced Practice Providers (APPs -  Physician Assistants and Nurse Practitioners) who all work together to provide you with the care you need, when you need it.  We recommend signing up for the patient portal called "MyChart".  Sign up information is provided on this After Visit Summary.  MyChart is used to connect with patients for Virtual Visits (Telemedicine).  Patients are able to view lab/test results, encounter notes, upcoming appointments, etc.  Non-urgent messages can be sent to your provider as well.   To learn more about what you can do with MyChart, go to [facility].    Your next appointment:    07/23/2021 at 1020am with 07/25/2021, PA-C  Addendum from Dr Otilio Saber after pt left the office -   ** Hold Lisinopril x 72 hours  Take Torsemide 20mg  twice daily x 3 days then reduce to Torsemide 20mg  daily.    ** Attempted to contact pt by phone - unable to leave voicemail message as voicemail has not been  set up.

## 2021-07-14 NOTE — Telephone Encounter (Addendum)
Spoke with pt and pt's family member, Pilar Plate per verbal permission of pt.  Advised of Dr Odessa Fleming instructions to hold Lisinopril x 3 days and take Torsemide 20mg  twice daily x 3 days then reduce to Torsemide 20mg  - 1 tablet by mouth daily.  Pt will contact RN if he does not see an increase in urination and decrease in edema.  Pt and pt's family member verbalize understanding and agrees with current plan.

## 2021-07-14 NOTE — Telephone Encounter (Signed)
Addendum from Dr Caryl Comes after pt left the office -   ** Hold Lisinopril x 72 hours  ** Take Torsemide 20mg  twice daily x 3 days then reduce to Torsemide 20mg  daily.    ** Attempted to contact pt by phone - unable to leave voicemail message as voicemail has not been set up.

## 2021-07-15 ENCOUNTER — Telehealth: Payer: Self-pay | Admitting: Internal Medicine

## 2021-07-15 NOTE — Telephone Encounter (Signed)
New Message:     Please call,question about his Torsemide.  Pt c/o medication issue:  1. Name of Medication: Torsemide  2. How are you currently taking this medication (dosage and times per day)?  1  time a day  3. Are you having a reaction (difficulty breathing--STAT)?   4. What is your medication issue? Questions about this medicine

## 2021-07-15 NOTE — Telephone Encounter (Signed)
Spoke with [name] with verbal permission of pt.  Reviewed medication instructions to take Torsemide 20mg  - 1 tablet by mouth twice daily x 3 days then reduce to Torsemide 20mg  - 1 tablet  by m outh daily.  Will need to hold Lisinopril 2.5mg  while on Torsemide twice daily then resume. She verbalizes understanding and agrees with current plan.

## 2021-07-16 ENCOUNTER — Other Ambulatory Visit (INDEPENDENT_AMBULATORY_CARE_PROVIDER_SITE_OTHER): Payer: 59 | Admitting: *Deleted

## 2021-07-16 DIAGNOSIS — I4819 Other persistent atrial fibrillation: Secondary | ICD-10-CM | POA: Diagnosis not present

## 2021-07-16 DIAGNOSIS — Z95810 Presence of automatic (implantable) cardiac defibrillator: Secondary | ICD-10-CM

## 2021-07-16 DIAGNOSIS — I5022 Chronic systolic (congestive) heart failure: Secondary | ICD-10-CM

## 2021-07-21 LAB — CUP PACEART INCLINIC DEVICE CHECK
Battery Remaining Longevity: 72 mo
Battery Voltage: 2.99 V
Brady Statistic RA Percent Paced: 0 %
Brady Statistic RV Percent Paced: 57 %
Date Time Interrogation Session: 20230109170000
Implantable Lead Implant Date: 20120905
Implantable Lead Implant Date: 20120905
Implantable Lead Implant Date: 20120905
Implantable Lead Location: 753858
Implantable Lead Location: 753859
Implantable Lead Location: 753860
Implantable Pulse Generator Implant Date: 20190610
Lead Channel Pacing Threshold Amplitude: 1 V
Lead Channel Pacing Threshold Amplitude: 1.25 V
Lead Channel Pacing Threshold Pulse Width: 0.4 ms
Lead Channel Pacing Threshold Pulse Width: 0.5 ms
Lead Channel Sensing Intrinsic Amplitude: 11.2 mV
Lead Channel Setting Pacing Amplitude: 0.25 V
Lead Channel Setting Pacing Amplitude: 2 V
Lead Channel Setting Pacing Pulse Width: 0.3 ms
Lead Channel Setting Pacing Pulse Width: 0.5 ms
Lead Channel Setting Sensing Sensitivity: 2 mV
Pulse Gen Model: 3222
Pulse Gen Serial Number: 9022287

## 2021-07-23 ENCOUNTER — Encounter: Payer: 59 | Admitting: Student

## 2021-07-23 ENCOUNTER — Other Ambulatory Visit: Payer: Self-pay | Admitting: Internal Medicine

## 2021-07-23 DIAGNOSIS — I4892 Unspecified atrial flutter: Secondary | ICD-10-CM

## 2021-07-24 ENCOUNTER — Encounter (HOSPITAL_COMMUNITY): Payer: Self-pay | Admitting: Cardiology

## 2021-08-11 ENCOUNTER — Ambulatory Visit (INDEPENDENT_AMBULATORY_CARE_PROVIDER_SITE_OTHER): Payer: 59 | Admitting: Pharmacist

## 2021-08-11 DIAGNOSIS — Z8679 Personal history of other diseases of the circulatory system: Secondary | ICD-10-CM | POA: Diagnosis not present

## 2021-08-11 DIAGNOSIS — Z7901 Long term (current) use of anticoagulants: Secondary | ICD-10-CM | POA: Diagnosis not present

## 2021-08-11 DIAGNOSIS — Z952 Presence of prosthetic heart valve: Secondary | ICD-10-CM

## 2021-08-11 DIAGNOSIS — I4892 Unspecified atrial flutter: Secondary | ICD-10-CM

## 2021-08-11 LAB — POCT INR: INR: 5.6 — AB (ref 2.0–3.0)

## 2021-08-11 MED ORDER — WARFARIN SODIUM 5 MG PO TABS: ORAL_TABLET | ORAL | 0 refills | Status: DC

## 2021-08-11 NOTE — Progress Notes (Signed)
Anticoagulation Management [name] is a 76 y.o. male who reports to the clinic for monitoring of warfarin treatment.    Indication:  History of atrial flutter, mechanical mitral valve present; recent history of Gastrointestinal bleeding, admitted to hospital; long term current use of warfarin with target INR 2.5 - 3.5   Duration: indefinite Supervising physician:  Velna Ochs, MD  Anticoagulation Clinic Visit History: Patient does not report signs/symptoms of bleeding or thromboembolism  Other recent changes: No diet, medications, lifestyle changes except as noted in patient findings.  Anticoagulation Episode Summary     Current INR goal:  2.5-3.5  TTR:  60.8 % (8.9 y)  Next INR check:  08/18/2021  INR from last check:  5.6 (08/11/2021)  Weekly max warfarin dose:    Target end date:  Indefinite  INR check location:  Anticoagulation Clinic  Preferred lab:    Send INR reminders to:  ANT[facility]   Indications   History of atrial flutter [Z86.79] Long term (current) use of anticoagulants (Resolved) [Z79.01] ATRIAL FLUTTER (Resolved) [I48.92] H/O mitral valve replacement with mechanical valve [Z95.2]        Comments:          Anticoagulation Care Providers     Provider Role Specialty Phone number   Jolaine Artist, MD  Cardiology [phone]       No Known Allergies  Current Outpatient Medications:    carvedilol (COREG) 6.25 MG tablet, Take 1 tablet (6.25 mg total) by mouth 2 (two) times daily with a meal., Disp: 180 tablet, Rfl: 3   lisinopril (ZESTRIL) 2.5 MG tablet, Take 1 tablet (2.5 mg total) by mouth daily. Absolute last refill without office visit please call [phone], Disp: 30 tablet, Rfl: 0   pantoprazole (PROTONIX) 40 MG tablet, TAKE 1 TABLET (40 MG TOTAL) BY MOUTH TWICE A DAY BEFORE MEALS, Disp: 60 tablet, Rfl: 0   tamsulosin (FLOMAX) 0.4 MG CAPS capsule, Take 2 capsules (0.8 mg total) by mouth daily after breakfast., Disp: 180 capsule, Rfl:  3   torsemide (DEMADEX) 20 MG tablet, Take 1 tablet (20 mg total) by mouth daily., Disp: 90 tablet, Rfl: 1   acetaminophen (TYLENOL) 500 MG tablet, Take 1,000 mg by mouth every 6 (six) hours as needed (pain). (Patient not taking: Reported on 08/11/2021), Disp: , Rfl:    diclofenac Sodium (VOLTAREN) 1 % GEL, APPLY 4 G TOPICALLY 4 TIMES DAILY (Patient not taking: Reported on 06/27/2021), Disp: 400 g, Rfl: 11   warfarin (COUMADIN) 5 MG tablet, Take 1/2 tablet on Tuesdays, Wednesdays and Fridays. All other days, (Sundays, Mondays, Thursdays and Saturdays), take one (1) tablet., Disp: 30 tablet, Rfl: 0 Past Medical History:  Diagnosis Date   Acute GI bleeding 06/27/2021   Anemia 01/29/2016   Atrial fibrillation ([place])    post op.  s/p dc-cv 04/2008. 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  a- Echo 07/2008 Ef 55% mild peroprosthetic MVR with High transmitral gradient (mean 14).  b- normal coronaries by cath 12/2007   BPH (benign prostatic hyperplasia)    Cardiac resynchronization therapy pacemaker (CRT-P) in place 06/22/2011   CHF (congestive heart failure) (HCC)    EF 35-40 % 2011 due to valvular disease and diastolic dysfunction   Chronic back pain    CKD (chronic kidney disease), stage III ([place])  COVID-19 virus infection 07/24/2020   Tested positive on July 16, 2020   Dysphagia    with normal barium swallow 09/2008   Endocarditis    GERD (gastroesophageal reflux disease)    Heavy alcohol use    history   History of atrial flutter 03/13/2008   History of atrial flutter - resolved.    History of GI bleed    Hypertension    IV drug abuse ([place])    history of   Lower GI bleed 01/2016   Pacemaker 2010   Social History   Socioeconomic History   Marital status: Divorced    Spouse name: Not on file   Number of children: 9   Years of  education: Not on file   Highest education level: Not on file  Occupational History   Occupation: retired    Comment: [employer]  Tobacco Use   Smoking status: Former    Years: 0.00    Types: Cigarettes   Smokeless tobacco: Never  Vaping Use   Vaping Use: Never used  Substance and Sexual Activity   Alcohol use: No    Alcohol/week: 0.0 standard drinks   Drug use: No   Sexual activity: Not Currently  Other Topics Concern   Not on file  Social History Narrative   Current Social History 02/16/2019        Patient lives with family (baby's 53, daughter and 4 grandkids:  25 yo twins, 44 yo and 74 yo in a home which is 1 story. There are 4 steps with handrails up to the entrance the patient uses.       Patient's method of transportation is via family member (baby's mama).      The highest level of education was high school diploma.      The patient currently retired.      Identified important Relationships are "My 12 Joselyn Arrow, [name]."       Pets : None       Interests / Fun: Sitting outdoors watching the birds and planes."       Exercise riding a bike 5 miles 3 days/week      Current Stressors: The grandkids       Religious / Personal Beliefs: "I believe in God."       L. Ducatte, RN, BSN       Social Determinants of Health   Financial Resource Strain: Not on file  Food Insecurity: Not on file  Transportation Needs: Not on file  Physical Activity: Not on file  Stress: Not on file  Social Connections: Not on file   Family History  Problem Relation Age of Onset   Coronary artery disease Mother    Cancer Father        GI   Cancer Brother        Lung    ASSESSMENT Recent Results: The most recent result is correlated with 30 mg per week: Lab Results  Component Value Date   INR 5.6 (A) 08/11/2021   INR 3.1 (A) 07/09/2021   INR 1.6 (H) 06/30/2021    Anticoagulation Dosing: Description   OMIT your warfarin dose for Tuesday, February 7th. Resume taking  warfarin on Wednesday, February 8th, 2023. Take ONLY 1/2 tablet that day, take one tablet on Thursday, February 9th, 2023. Take ONLY 1/2 tablet on Friday, February 10th, 2023. On Saturday and Sunday (February 11 and 12, 2023) take one (1) tablet.      INR today: Supratherapeutic  PLAN Weekly dose  was decreased by  25 % to 22.5 mg per week after ONE omitted dose scheduled for Tuesday August 12, 2021 (had already taken todays dose of warfarin.) RTC on Monday 13-FEB-23 for repeat INR. Was advised to purchase one bottle of Lipton's Green Tea and to drink 1/2 of the bottle's contents as a source of vitamin K1 to decrease the INR.   Patient Instructions  OMIT your warfarin dose for Tuesday, February 7th. Resume taking warfarin on Wednesday, February 8th, 2023. Take ONLY 1/2 tablet that day, take one tablet on Thursday, February 9th, 2023. Take ONLY 1/2 tablet on Friday, February 10th, 2023. On Saturday and Sunday (February 11 and 12, 2023) take one (1) tablet.  Patient advised to contact clinic or seek medical attention if signs/symptoms of bleeding or thromboembolism occur.  Patient verbalized understanding by repeating back information and was advised to contact me if further medication-related questions arise. Patient was also provided an information handout.  Follow-up Return in 1 week (on 08/18/2021) for Follow up INR.  Pennie Banter, PharmD, CPP  15 minutes spent face-to-face with the patient during the encounter. 50% of time spent on education, including signs/sx bleeding and clotting, as well as food and drug interactions with warfarin. 50% of time was spent on fingerprick POC INR sample collection,processing, results determination, and documentation in http://www.kim.net/.

## 2021-08-11 NOTE — Patient Instructions (Signed)
OMIT your warfarin dose for Tuesday, February 7th. Resume taking warfarin on Wednesday, February 8th, 2023. Take ONLY 1/2 tablet that day, take one tablet on Thursday, February 9th, 2023. Take ONLY 1/2 tablet on Friday, February 10th, 2023. On Saturday and Sunday (February 11 and 12, 2023) take one (1) tablet.

## 2021-08-12 NOTE — Progress Notes (Signed)
INTERNAL MEDICINE TEACHING ATTENDING ADDENDUM   I agree with pharmacy recommendations as outlined in their note.   Mcdonald Reiling, MD  

## 2021-08-15 ENCOUNTER — Telehealth: Payer: Self-pay | Admitting: *Deleted

## 2021-08-15 NOTE — Telephone Encounter (Signed)
Patient's wife called in stating patient's LE are very swollen x 1 week. Also, notes fatigue and SHOB with walking. Denies SHOB at rest. He elevates his legs "sometimes" as he "can't sit still." Appt given for 2/13 at 0845. She is advised to take him to ED if he develops [facility] at rest. She is in agreement.

## 2021-08-18 ENCOUNTER — Ambulatory Visit (INDEPENDENT_AMBULATORY_CARE_PROVIDER_SITE_OTHER): Payer: 59 | Admitting: Internal Medicine

## 2021-08-18 ENCOUNTER — Other Ambulatory Visit: Payer: Self-pay

## 2021-08-18 ENCOUNTER — Ambulatory Visit (INDEPENDENT_AMBULATORY_CARE_PROVIDER_SITE_OTHER): Payer: 59 | Admitting: Pharmacist

## 2021-08-18 ENCOUNTER — Encounter: Payer: Self-pay | Admitting: Internal Medicine

## 2021-08-18 VITALS — BP 120/57 | HR 78 | Temp 98.1°F | Ht 68.0 in | Wt 200.0 lb

## 2021-08-18 DIAGNOSIS — Z952 Presence of prosthetic heart valve: Secondary | ICD-10-CM | POA: Diagnosis not present

## 2021-08-18 DIAGNOSIS — I10 Essential (primary) hypertension: Secondary | ICD-10-CM

## 2021-08-18 DIAGNOSIS — I130 Hypertensive heart and chronic kidney disease with heart failure and stage 1 through stage 4 chronic kidney disease, or unspecified chronic kidney disease: Secondary | ICD-10-CM

## 2021-08-18 DIAGNOSIS — Z8679 Personal history of other diseases of the circulatory system: Secondary | ICD-10-CM | POA: Diagnosis not present

## 2021-08-18 DIAGNOSIS — N1831 Chronic kidney disease, stage 3a: Secondary | ICD-10-CM

## 2021-08-18 DIAGNOSIS — I5022 Chronic systolic (congestive) heart failure: Secondary | ICD-10-CM

## 2021-08-18 LAB — POCT INR: INR: 3.8 — AB (ref 2.0–3.0)

## 2021-08-18 NOTE — Assessment & Plan Note (Signed)
Continue to avoid nephrotoxic agents. Will recheck BMP today as we are temporarily increasing torsemide dose to 20 mg bid given his worsening leg swelling (see chronic systolic HF A/P).

## 2021-08-18 NOTE — Assessment & Plan Note (Addendum)
Patient presented to the [facility] after noticing an increase in lower extremity swelling that has been ongoing for the past 3 weeks. He saw his cardiologist on 1/9 and was started on torsemide 20 mg daily, which he has been taking. He endorses shortness of breath at rest on occasion, but this is chronic and has not worsened from his baseline. He also has occasional orthopnea, requiring extra pillows at night or sleeping in a recliner, but this also, is at his baseline. He denies any PND. The patient also did not experience any DOE while walking to the [facility] today. On exam, he has 3+ LE pitting edema and his skin is stretched, taut, and shiny. He has rales in his bilateral lung bases, but is otherwise breathing comfortably on room air. He is hemodynamically stable at this time and does not require admission to the hospital. Will check BMP today and increase his torsemide dose, with close follow up in 1 week to reevalute symptoms.   Plan: - Check BMP today - Increase torsemide to 20 mg BID until follow up appt in 1 week  Addendum: Discussed BMP results with patient via phone. He also notes that his swelling is worse, despite taking torsemide 20 mg bid and now notes that his hands are swollen. His breathing has not improved as well and he states he cannot catch a deep breath. Advised patient to go to the emergency room- he is in agreement.

## 2021-08-18 NOTE — Progress Notes (Signed)
Evaluation and management procedures were performed by the Clinical Pharmacy Practitioner under my supervision and collaboration. I have reviewed the Practitioner's note and chart, and I agree with the management and plan as documented above. ° °

## 2021-08-18 NOTE — Assessment & Plan Note (Signed)
BP Readings from Last 3 Encounters:  08/18/21 (!) 120/57  07/14/21 106/64  07/09/21 (!) 109/59   BP today 120/57. Patient has been taking carvedilol 6.25 mg bid and lisinopril 2.5 mg. He was also started on torsemide 20 mg daily by his cardiologist in January and he has been tolerating this well.  Plan: - Continue antihypertensive medications as above - BMP today

## 2021-08-18 NOTE — Patient Instructions (Signed)
Thank you, Mr.Myer T Hickam for allowing [place] to provide your care today. Today we discussed:  Leg swelling: I believe your leg  swelling is related to your heart failure. Please INCREASE your torsemide from once a day to TWICE a day until I see you in one week. We are also checking some labs today.  I have ordered the following labs for you:   Lab Orders         BMP8+Anion Gap      Tests ordered today:  none  Referrals ordered today:   Referral Orders  No referral(s) requested today     I have ordered the following medication/changed the following medications:   Stop the following medications: There are no discontinued medications.   Start the following medications: No orders of the defined types were placed in this encounter.    Follow up:  1 week     Remember: to take your torsemide twice a day and to take your coumadin as directed on your paperwork  Should you have any questions or concerns please call the internal medicine clinic at [phone].     [name], D.O. [facility]

## 2021-08-18 NOTE — Progress Notes (Signed)
° °  CC: leg swelling  HPI:  [name] is a 76 y.o. male with HTN, HFrEF (last EF 35-40%), BPH, and CKD 3a who presents to the [facility] for an acute visit related to bilateral leg swelling. Please see problem-based list for further details, assessments, and plans.   Past Medical History:  Diagnosis Date   Acute GI bleeding 06/27/2021   Anemia 01/29/2016   Atrial fibrillation ([place])    post op.  s/p dc-cv 04/2008. 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  a- Echo 07/2008 Ef 55% mild peroprosthetic MVR with High transmitral gradient (mean 14).  b- normal coronaries by cath 12/2007   BPH (benign prostatic hyperplasia)    Cardiac resynchronization therapy pacemaker (CRT-P) in place 06/22/2011   CHF (congestive heart failure) (HCC)    EF 35-40 % 2011 due to valvular disease and diastolic dysfunction   Chronic back pain    CKD (chronic kidney disease), stage III ([place])    COVID-19 virus infection 07/24/2020   Tested positive on July 16, 2020   Dysphagia    with normal barium swallow 09/2008   Endocarditis    GERD (gastroesophageal reflux disease)    Heavy alcohol use    history   History of atrial flutter 03/13/2008   History of atrial flutter - resolved.    History of GI bleed    Hypertension    IV drug abuse ([place])    history of   Lower GI bleed 01/2016   Pacemaker 2010   Review of Systems:  Review of Systems  Constitutional:  Negative for chills and fever.  HENT: Negative.    Eyes: Negative.   Respiratory:  Positive for shortness of breath. Negative for cough.   Cardiovascular:  Positive for orthopnea and leg swelling. Negative for chest pain, palpitations and PND.  Gastrointestinal:  Negative for diarrhea, nausea and vomiting.  Genitourinary:  Negative for dysuria and hematuria.  Musculoskeletal: Negative.   Neurological:   Negative for dizziness and headaches.  Psychiatric/Behavioral: Negative.      Physical Exam:   Vitals:   08/18/21 0943  BP: (!) 120/57  Pulse: 78  Temp: 98.1 F (36.7 C)  TempSrc: Oral  SpO2: 99%  Weight: 200 lb (90.7 kg)  Height: 5\' 8"  (1.727 m)    General: No acute distress, resting comfortably in chair and while laying on bed.  CV: RRR, no murmurs. JVP ~9 cm. 3+ LE edema. Pulmonary: Bilateral rales in lung bases. Normal work of breathing. Abdominal: Soft, nontender, nondistended.  Extremities: 3+ LE pitting edema up to knees. Skin is stretched, taut, and shiny.  Neuro: A&Ox3. No focal deficit. Psych: Normal mood and affect  Assessment & Plan:   See Encounters Tab for problem based charting.  Patient seen with Dr. Jimmye Norman

## 2021-08-18 NOTE — Patient Instructions (Signed)
Patient instructed to take medications as defined in the Anti-coagulation Track section of this encounter.  Patient instructed to take today's dose.  Patient instructed to take one-half (1/2) of your 5mg  peach-colored warfarin tablet on Mondays, Tuesdays, Wednesdays and Fridays. On Thursdays, Saturdays and Sundays--take one (1) tablet of your 5mg  peach-colored warfarin tablets.  Patient verbalized understanding of these instructions.

## 2021-08-18 NOTE — Progress Notes (Signed)
Anticoagulation Management [name] is a 76 y.o. male who reports to the clinic for monitoring of warfarin treatment.    Indication: atrial fibrillation , mitral valve replacement requiring INR 2.5 - 3.5; long term current use of oral anticoagulant, warfarin.  Duration: indefinite Supervising physician: Joni Reining  Anticoagulation Clinic Visit History: Patient does not report signs/symptoms of bleeding or thromboembolism  Other recent changes: No diet, medications, lifestyle except as noted in patient findings.  Anticoagulation Episode Summary     Current INR goal:  2.5-3.5  TTR:  60.7 % (8.9 y)  Next INR check:  09/08/2021  INR from last check:  3.8 (08/18/2021)  Weekly max warfarin dose:    Target end date:  Indefinite  INR check location:  Anticoagulation Clinic  Preferred lab:    Send INR reminders to:  ANT[facility]   Indications   History of atrial flutter [Z86.79] Long term (current) use of anticoagulants (Resolved) [Z79.01] ATRIAL FLUTTER (Resolved) [I48.92] H/O mitral valve replacement with mechanical valve [Z95.2]        Comments:          Anticoagulation Care Providers     Provider Role Specialty Phone number   Jolaine Artist, MD  Cardiology [phone]       No Known Allergies  Current Outpatient Medications:    carvedilol (COREG) 6.25 MG tablet, Take 1 tablet (6.25 mg total) by mouth 2 (two) times daily with a meal., Disp: 180 tablet, Rfl: 3   lisinopril (ZESTRIL) 2.5 MG tablet, Take 1 tablet (2.5 mg total) by mouth daily. Absolute last refill without office visit please call [phone], Disp: 30 tablet, Rfl: 0   pantoprazole (PROTONIX) 40 MG tablet, TAKE 1 TABLET (40 MG TOTAL) BY MOUTH TWICE A DAY BEFORE MEALS, Disp: 60 tablet, Rfl: 0   tamsulosin (FLOMAX) 0.4 MG CAPS capsule, Take 2 capsules (0.8 mg total) by mouth daily after breakfast., Disp: 180 capsule, Rfl: 3   torsemide (DEMADEX) 20 MG tablet, Take 1 tablet (20 mg total) by mouth  daily., Disp: 90 tablet, Rfl: 1   warfarin (COUMADIN) 5 MG tablet, Take 1/2 tablet on Tuesdays, Wednesdays and Fridays. All other days, (Sundays, Mondays, Thursdays and Saturdays), take one (1) tablet., Disp: 30 tablet, Rfl: 0   acetaminophen (TYLENOL) 500 MG tablet, Take 1,000 mg by mouth every 6 (six) hours as needed (pain). (Patient not taking: Reported on 08/11/2021), Disp: , Rfl:    diclofenac Sodium (VOLTAREN) 1 % GEL, APPLY 4 G TOPICALLY 4 TIMES DAILY (Patient not taking: Reported on 06/27/2021), Disp: 400 g, Rfl: 11 Past Medical History:  Diagnosis Date   Acute GI bleeding 06/27/2021   Anemia 01/29/2016   Atrial fibrillation ([place])    post op.  s/p dc-cv 04/2008. 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  a- Echo 07/2008 Ef 55% mild peroprosthetic MVR with High transmitral gradient (mean 14).  b- normal coronaries by cath 12/2007   BPH (benign prostatic hyperplasia)    Cardiac resynchronization therapy pacemaker (CRT-P) in place 06/22/2011   CHF (congestive heart failure) (HCC)    EF 35-40 % 2011 due to valvular disease and diastolic dysfunction   Chronic back pain    CKD (chronic kidney disease), stage III ([place])    COVID-19 virus infection 07/24/2020   Tested positive on July 16, 2020   Dysphagia    with normal barium swallow 09/2008   Endocarditis    GERD (gastroesophageal reflux disease)    Heavy alcohol use    history   History of atrial flutter 03/13/2008   History of atrial flutter - resolved.    History of GI bleed    Hypertension    IV drug abuse ([place])    history of   Lower GI bleed 01/2016   Pacemaker 2010   Social History   Socioeconomic History   Marital status: Divorced    Spouse name: Not on file   Number of children: 9   Years of education: Not on file   Highest education level: Not on file  Occupational History    Occupation: retired    Comment: [employer]  Tobacco Use   Smoking status: Former    Years: 0.00    Types: Cigarettes   Smokeless tobacco: Never  Vaping Use   Vaping Use: Never used  Substance and Sexual Activity   Alcohol use: No    Alcohol/week: 0.0 standard drinks   Drug use: No   Sexual activity: Not Currently  Other Topics Concern   Not on file  Social History Narrative   Current Social History 02/16/2019        Patient lives with family (baby's 32, daughter and 4 grandkids:  37 yo twins, 28 yo and 16 yo in a home which is 1 story. There are 4 steps with handrails up to the entrance the patient uses.       Patient's method of transportation is via family member (baby's mama).      The highest level of education was high school diploma.      The patient currently retired.      Identified important Relationships are "My 25 Joselyn Arrow, [name]."       Pets : None       Interests / Fun: Sitting outdoors watching the birds and planes."       Exercise riding a bike 5 miles 3 days/week      Current Stressors: The grandkids       Religious / Personal Beliefs: "I believe in God."       L. Ducatte, RN, BSN       Social Determinants of Health   Financial Resource Strain: Not on file  Food Insecurity: Not on file  Transportation Needs: Not on file  Physical Activity: Not on file  Stress: Not on file  Social Connections: Not on file   Family History  Problem Relation Age of Onset   Coronary artery disease Mother    Cancer Father        GI   Cancer Brother        Lung    ASSESSMENT Recent Results: The most recent result is correlated with 20 mg per week which included one OMITTED dose due to hypoprothrombinemic response noted at last visit.     Anticoagulation Dosing: Description   Take one-half (1/2) of your 5mg  peach-colored warfarin tablet on Mondays, Tuesdays, Wednesdays and Fridays. On Thursdays, Saturdays and Sundays--take one (1) tablet of your  5mg  peach-colored warfarin tablets.      INR today: Supratherapeutic  PLAN Weekly dose was increased by 11% to 25 mg per week  Patient Instructions  Patient instructed to take medications as defined in the Anti-coagulation Track section of this encounter.  Patient instructed to take today's dose.  Patient instructed to take one-half (1/2) of your  5mg  peach-colored warfarin tablet on Mondays, Tuesdays, Wednesdays and Fridays. On Thursdays, Saturdays and Sundays--take one (1) tablet of your 5mg  peach-colored warfarin tablets.  Patient verbalized understanding of these instructions.   Patient advised to contact clinic or seek medical attention if signs/symptoms of bleeding or thromboembolism occur.  Patient verbalized understanding by repeating back information and was advised to contact me if further medication-related questions arise. Patient was also provided an information handout.  Follow-up Return in 3 weeks (on 09/08/2021) for Follow up INR.  Pennie Banter, PharmD, CPP  15 minutes spent face-to-face with the patient during the encounter. 50% of time spent on education, including signs/sx bleeding and clotting, as well as food and drug interactions with warfarin. 50% of time was spent on fingerprick POC INR sample collection,processing, results determination, and documentation in http://www.kim.net/.

## 2021-08-19 LAB — BMP8+ANION GAP
Anion Gap: 17 mmol/L (ref 10.0–18.0)
BUN/Creatinine Ratio: 11 (ref 10–24)
BUN: 20 mg/dL (ref 8–27)
CO2: 22 mmol/L (ref 20–29)
Calcium: 9.1 mg/dL (ref 8.6–10.2)
Chloride: 100 mmol/L (ref 96–106)
Creatinine, Ser: 1.77 mg/dL — ABNORMAL HIGH (ref 0.76–1.27)
Glucose: 88 mg/dL (ref 70–99)
Potassium: 3.6 mmol/L (ref 3.5–5.2)
Sodium: 139 mmol/L (ref 134–144)
eGFR: 39 mL/min/{1.73_m2} — ABNORMAL LOW (ref >59)

## 2021-08-20 ENCOUNTER — Telehealth: Payer: Self-pay

## 2021-08-20 NOTE — Telephone Encounter (Signed)
Pt  friend is requesting a call back .. she stated that his hands  and legs are still very swollen  and is she is wanting to know if to take him to the ED .Marland Kitchen pt has an upcoming appt 2/20

## 2021-08-20 NOTE — Telephone Encounter (Signed)
Call from patient's friend stating patient continues to have swelling in his legs.  Has some shortness of breath at times.  Plans to take to ER this afternoon.  Told that if symptoms worsen before 4 PM to call an ambulance.  Agreed to do so.  Will take patient to the ER this afternoon.

## 2021-08-20 NOTE — Telephone Encounter (Signed)
This needs to be sent to the [facility] Red team pool.

## 2021-08-21 ENCOUNTER — Other Ambulatory Visit: Payer: Self-pay | Admitting: Internal Medicine

## 2021-08-21 DIAGNOSIS — I4892 Unspecified atrial flutter: Secondary | ICD-10-CM

## 2021-08-22 ENCOUNTER — Encounter (HOSPITAL_COMMUNITY): Payer: Self-pay

## 2021-08-22 ENCOUNTER — Other Ambulatory Visit: Payer: Self-pay

## 2021-08-22 ENCOUNTER — Emergency Department (HOSPITAL_COMMUNITY): Payer: 59

## 2021-08-22 ENCOUNTER — Emergency Department (HOSPITAL_COMMUNITY)
Admission: EM | Admit: 2021-08-22 | Discharge: 2021-08-22 | Discharge status: Against Medical Advice | Payer: 59 | Attending: Emergency Medicine | Admitting: Emergency Medicine

## 2021-08-22 DIAGNOSIS — Z5321 Procedure and treatment not carried out due to patient leaving prior to being seen by health care provider: Secondary | ICD-10-CM | POA: Diagnosis not present

## 2021-08-22 DIAGNOSIS — I509 Heart failure, unspecified: Secondary | ICD-10-CM | POA: Insufficient documentation

## 2021-08-22 DIAGNOSIS — R0601 Orthopnea: Secondary | ICD-10-CM | POA: Diagnosis not present

## 2021-08-22 DIAGNOSIS — R0602 Shortness of breath: Secondary | ICD-10-CM | POA: Insufficient documentation

## 2021-08-22 DIAGNOSIS — R635 Abnormal weight gain: Secondary | ICD-10-CM | POA: Insufficient documentation

## 2021-08-22 DIAGNOSIS — M7989 Other specified soft tissue disorders: Secondary | ICD-10-CM | POA: Diagnosis not present

## 2021-08-22 LAB — COMPREHENSIVE METABOLIC PANEL
ALT: 15 U/L (ref 0–44)
AST: 27 U/L (ref 15–41)
Albumin: 3.6 g/dL (ref 3.5–5.0)
Alkaline Phosphatase: 57 U/L (ref 38–126)
Anion gap: 10 (ref 5–15)
BUN: 22 mg/dL (ref 8–23)
CO2: 24 mmol/L (ref 22–32)
Calcium: 8.9 mg/dL (ref 8.9–10.3)
Chloride: 103 mmol/L (ref 98–111)
Creatinine, Ser: 2.02 mg/dL — ABNORMAL HIGH (ref 0.61–1.24)
GFR, Estimated: 33 mL/min — ABNORMAL LOW (ref >60)
Glucose, Bld: 88 mg/dL (ref 70–99)
Potassium: 3.5 mmol/L (ref 3.5–5.1)
Sodium: 137 mmol/L (ref 135–145)
Total Bilirubin: 0.7 mg/dL (ref 0.3–1.2)
Total Protein: 6.6 g/dL (ref 6.5–8.1)

## 2021-08-22 LAB — CBC
HCT: 30.2 % — ABNORMAL LOW (ref 39.0–52.0)
Hemoglobin: 8.7 g/dL — ABNORMAL LOW (ref 13.0–17.0)
MCH: 22.6 pg — ABNORMAL LOW (ref 26.0–34.0)
MCHC: 28.8 g/dL — ABNORMAL LOW (ref 30.0–36.0)
MCV: 78.4 fL — ABNORMAL LOW (ref 80.0–100.0)
Platelets: 375 10*3/uL (ref 150–400)
RBC: 3.85 MIL/uL — ABNORMAL LOW (ref 4.22–5.81)
RDW: 19.9 % — ABNORMAL HIGH (ref 11.5–15.5)
WBC: 5.6 10*3/uL (ref 4.0–10.5)
nRBC: 0 % (ref 0.0–0.2)

## 2021-08-22 LAB — BRAIN NATRIURETIC PEPTIDE: B Natriuretic Peptide: 135.3 pg/mL — ABNORMAL HIGH (ref 0.0–100.0)

## 2021-08-22 NOTE — ED Notes (Signed)
Pt decided to leave while waiting for a room.  

## 2021-08-22 NOTE — ED Provider Triage Note (Signed)
[facility] Provider Triage Evaluation Note  [name] , a 76 y.o. male  was evaluated in triage.  Pt complains of shortness of breath and leg swelling.  History of CHF, compliant with his home Lasix regimen and states that he has been having increased leg swelling and shortness of breath over the last week.  Endorses significant weight gain as well.  States that his shortness of breath is mostly just with exertion.  Has associated PND and orthopnea as well.  Denies any chest pain, fevers, or chills.  Review of Systems  Positive: Shortness of breath, bilateral leg swelling Negative: Chest pain  Physical Exam  Ht 5\' 8"  (1.727 m)    Wt 90.7 kg    BMI 30.41 kg/m  Gen:   Awake, no distress   Resp:  Normal effort  MSK:   Moves extremities without difficulty  Other:  3+ pitting edema in bilateral lower extremities  Medical Decision Making  Medically screening exam initiated at 5:27 PM.  Appropriate orders placed.  was informed that the remainder of the evaluation will be completed by another provider, this initial triage assessment does not replace that evaluation, and the importance of remaining in the ED until their evaluation is complete.     [name], PA-C 08/22/21 1729

## 2021-08-22 NOTE — Progress Notes (Signed)
Internal Medicine Clinic Attending ° °I saw and evaluated the patient.  I personally confirmed the key portions of the history and exam documented by Dr. Atway and I reviewed pertinent patient test results.  The assessment, diagnosis, and plan were formulated together and I agree with the documentation in the resident’s note.  °

## 2021-08-22 NOTE — ED Triage Notes (Signed)
Pt arrives POV For eval of blt LE edema and SOB. Pt reports hx of CHF, states takes lasix. Reports compliant w/ lasix. Pt reports orthopnea and DOE

## 2021-08-25 ENCOUNTER — Encounter: Payer: 59 | Admitting: Internal Medicine

## 2021-08-25 ENCOUNTER — Emergency Department (HOSPITAL_COMMUNITY): Payer: 59

## 2021-08-25 ENCOUNTER — Emergency Department (HOSPITAL_COMMUNITY)
Admission: EM | Admit: 2021-08-25 | Discharge: 2021-08-25 | Disposition: A | Discharge status: Home / Self Care | Payer: 59 | Attending: Emergency Medicine | Admitting: Emergency Medicine

## 2021-08-25 ENCOUNTER — Encounter (HOSPITAL_COMMUNITY): Payer: Self-pay | Admitting: Emergency Medicine

## 2021-08-25 ENCOUNTER — Other Ambulatory Visit: Payer: Self-pay

## 2021-08-25 DIAGNOSIS — R2233 Localized swelling, mass and lump, upper limb, bilateral: Secondary | ICD-10-CM | POA: Diagnosis not present

## 2021-08-25 DIAGNOSIS — Z5321 Procedure and treatment not carried out due to patient leaving prior to being seen by health care provider: Secondary | ICD-10-CM | POA: Diagnosis not present

## 2021-08-25 DIAGNOSIS — R2243 Localized swelling, mass and lump, lower limb, bilateral: Secondary | ICD-10-CM | POA: Insufficient documentation

## 2021-08-25 DIAGNOSIS — R0602 Shortness of breath: Secondary | ICD-10-CM | POA: Insufficient documentation

## 2021-08-25 LAB — CBC
HCT: 32.1 % — ABNORMAL LOW (ref 39.0–52.0)
Hemoglobin: 9.2 g/dL — ABNORMAL LOW (ref 13.0–17.0)
MCH: 22 pg — ABNORMAL LOW (ref 26.0–34.0)
MCHC: 28.7 g/dL — ABNORMAL LOW (ref 30.0–36.0)
MCV: 76.8 fL — ABNORMAL LOW (ref 80.0–100.0)
Platelets: 434 10*3/uL — ABNORMAL HIGH (ref 150–400)
RBC: 4.18 MIL/uL — ABNORMAL LOW (ref 4.22–5.81)
RDW: 19.5 % — ABNORMAL HIGH (ref 11.5–15.5)
WBC: 6.5 10*3/uL (ref 4.0–10.5)
nRBC: 0 % (ref 0.0–0.2)

## 2021-08-25 LAB — COMPREHENSIVE METABOLIC PANEL
ALT: 13 U/L (ref 0–44)
AST: 30 U/L (ref 15–41)
Albumin: 3.6 g/dL (ref 3.5–5.0)
Alkaline Phosphatase: 54 U/L (ref 38–126)
Anion gap: 10 (ref 5–15)
BUN: 20 mg/dL (ref 8–23)
CO2: 25 mmol/L (ref 22–32)
Calcium: 9.2 mg/dL (ref 8.9–10.3)
Chloride: 98 mmol/L (ref 98–111)
Creatinine, Ser: 1.96 mg/dL — ABNORMAL HIGH (ref 0.61–1.24)
GFR, Estimated: 35 mL/min — ABNORMAL LOW (ref >60)
Glucose, Bld: 107 mg/dL — ABNORMAL HIGH (ref 70–99)
Potassium: 3.6 mmol/L (ref 3.5–5.1)
Sodium: 133 mmol/L — ABNORMAL LOW (ref 135–145)
Total Bilirubin: 0.8 mg/dL (ref 0.3–1.2)
Total Protein: 7.1 g/dL (ref 6.5–8.1)

## 2021-08-25 NOTE — ED Provider Triage Note (Signed)
Emergency Medicine Provider Triage Evaluation Note  [name] , a 76 y.o. male  was evaluated in triage.  Pt complains of leg swelling and SOB for a month. Worse in the past week. No recent illness.  Review of Systems  Positive: Leg swelling, SOB Negative: CP, fever, cough  Physical Exam  BP 130/64 (BP Location: Left Arm)    Pulse 82    Temp 98.4 F (36.9 C)    Resp 18    SpO2 99%  Gen:   Awake, no distress   Resp:  Normal effort  MSK:   Moves extremities without difficulty  Other:  Bilateral pitting edema to the knees  Medical Decision Making  Medically screening exam initiated at 6:19 PM.  Appropriate orders placed.  [name] was informed that the remainder of the evaluation will be completed by another provider, this initial triage assessment does not replace that evaluation, and the importance of remaining in the ED until their evaluation is complete.     Su Monks, Cordelia Poche 08/25/21 1819

## 2021-08-25 NOTE — ED Triage Notes (Signed)
Patient brought in by family c/o bilateral leg edema and arm edema and shortness of breath over the past 2 weeks. Patient speaking in full sentences.

## 2021-08-25 NOTE — ED Notes (Signed)
Pt stated he was leaving and did not want to wait any longer

## 2021-08-26 ENCOUNTER — Ambulatory Visit (INDEPENDENT_AMBULATORY_CARE_PROVIDER_SITE_OTHER): Payer: 59

## 2021-08-26 DIAGNOSIS — I5022 Chronic systolic (congestive) heart failure: Secondary | ICD-10-CM | POA: Diagnosis not present

## 2021-08-26 LAB — CUP PACEART REMOTE DEVICE CHECK
Battery Remaining Longevity: 87 mo
Battery Remaining Percentage: 64 %
Battery Voltage: 3.01 V
Date Time Interrogation Session: 20230221020014
Implantable Lead Implant Date: 20120905
Implantable Lead Implant Date: 20120905
Implantable Lead Implant Date: 20120905
Implantable Lead Location: 753858
Implantable Lead Location: 753859
Implantable Lead Location: 753860
Implantable Pulse Generator Implant Date: 20190610
Lead Channel Impedance Value: 380 Ohm
Lead Channel Impedance Value: 590 Ohm
Lead Channel Pacing Threshold Amplitude: 1 V
Lead Channel Pacing Threshold Amplitude: 2 V
Lead Channel Pacing Threshold Pulse Width: 0.5 ms
Lead Channel Pacing Threshold Pulse Width: 0.6 ms
Lead Channel Sensing Intrinsic Amplitude: 12 mV
Lead Channel Setting Pacing Amplitude: 0.25 V
Lead Channel Setting Pacing Amplitude: 2 V
Lead Channel Setting Pacing Pulse Width: 0.3 ms
Lead Channel Setting Pacing Pulse Width: 0.5 ms
Lead Channel Setting Sensing Sensitivity: 2 mV
Pulse Gen Model: 3222
Pulse Gen Serial Number: 9022287

## 2021-08-27 ENCOUNTER — Telehealth: Payer: Self-pay | Admitting: *Deleted

## 2021-08-27 NOTE — Telephone Encounter (Signed)
No available appointments in the Clinics until Monday.

## 2021-08-27 NOTE — Telephone Encounter (Addendum)
Call from patient's friend states has been to the ER a couple of times has not gotten further that the vitals signs and some labs were drawn.  Due to possible 5 hour waits has left.  Patient has had swelling for a couple weeks  has some shortness of beath but no color changes.  Refuses to go to ER.  Suggestion to go to Urgent Care for assessment of swelling  and shortness of breath.  Patient's friend agreed and will do so.

## 2021-08-28 ENCOUNTER — Telehealth: Payer: Self-pay | Admitting: *Deleted

## 2021-08-28 NOTE — Telephone Encounter (Signed)
I agree

## 2021-08-28 NOTE — Telephone Encounter (Signed)
RT call from patient's friend.  Was unable to bring patient in for appointment and did not take patient to Urgent Care on yesterday.  Patient continues to have swelling in his legs.  Has agreed to bring patient in for an appointment on tomorrow morning .  Patient scheduled for an appointment for 08/29/2022 9:15 AM.

## 2021-08-29 ENCOUNTER — Ambulatory Visit (INDEPENDENT_AMBULATORY_CARE_PROVIDER_SITE_OTHER): Payer: 59 | Admitting: Internal Medicine

## 2021-08-29 DIAGNOSIS — R413 Other amnesia: Secondary | ICD-10-CM | POA: Diagnosis not present

## 2021-08-29 DIAGNOSIS — I10 Essential (primary) hypertension: Secondary | ICD-10-CM

## 2021-08-29 DIAGNOSIS — N401 Enlarged prostate with lower urinary tract symptoms: Secondary | ICD-10-CM | POA: Diagnosis not present

## 2021-08-29 DIAGNOSIS — I4892 Unspecified atrial flutter: Secondary | ICD-10-CM

## 2021-08-29 DIAGNOSIS — N1831 Chronic kidney disease, stage 3a: Secondary | ICD-10-CM

## 2021-08-29 DIAGNOSIS — I130 Hypertensive heart and chronic kidney disease with heart failure and stage 1 through stage 4 chronic kidney disease, or unspecified chronic kidney disease: Secondary | ICD-10-CM | POA: Diagnosis not present

## 2021-08-29 DIAGNOSIS — I5022 Chronic systolic (congestive) heart failure: Secondary | ICD-10-CM

## 2021-08-29 DIAGNOSIS — R351 Nocturia: Secondary | ICD-10-CM

## 2021-08-29 MED ORDER — PANTOPRAZOLE SODIUM 40 MG PO TBEC: DELAYED_RELEASE_TABLET | ORAL | 0 refills | Status: DC

## 2021-08-29 MED ORDER — TAMSULOSIN HCL 0.4 MG PO CAPS: 0.8000 mg | ORAL_CAPSULE | Freq: Every day | ORAL | 3 refills | Status: AC

## 2021-08-29 MED ORDER — WARFARIN SODIUM 5 MG PO TABS: ORAL_TABLET | ORAL | 0 refills | Status: DC

## 2021-08-29 MED ORDER — LISINOPRIL 2.5 MG PO TABS: 2.5000 mg | ORAL_TABLET | Freq: Every day | ORAL | 0 refills | Status: DC

## 2021-08-29 NOTE — Assessment & Plan Note (Signed)
BP well controlled today. 111/72. Continue current management

## 2021-08-29 NOTE — Patient Instructions (Signed)
[name]  It was a pleasure seeing you in the clinic today.   I put the medications I want you to take in the a pill box for you. You have pills to take in the morning and pills to take at night. AM- take the pills in your box PM- take the pills in your box as well as your warfarin  I want to see you back in 1 week.  Please call our clinic at [phone] if you have any questions or concerns. The best time to call is Monday-Friday from 9am-4pm, but there is someone available 24/7 at the same number. If you need medication refills, please notify your pharmacy one week in advance and they will send [place] a request.   Thank you for letting us take part in your care. We look forward to seeing you next time!

## 2021-08-29 NOTE — Assessment & Plan Note (Addendum)
Patient is volume overloaded on exam today but appears to be stable/slightly improved from last office visit 10 days ago. He has 2+ edema of the lower legs but only 1+ dependent edema of the thighs. He has crackles of the bilateral lung bases but is still able to ambulate and make it into clinic without assistance, he is speaking in full sentences and is not short of breath at rest. Denies any PND or difficulty breathing at night. No JVD on exam. Do not think he meets criteria for admission today.    When I asked him what medications he was taking after being last seen in clinic he showed me a medication list from a year ago that was not updated. I do not think he was taking torsemide twice a day, it's likely he was only taking this once a day.   - increase toresmide to twice a day - follow up one week - recheck bmp at that time - pill organizer filled for patient at today's visit

## 2021-08-29 NOTE — Progress Notes (Addendum)
° °  CC: leg swelling  HPI:  [name] is a 76 y.o. PMH noted below, who presents to the [facility] with complaints of leg swelling. To see the management of his acute and chronic conditions, please refer to the A&P note under the encounters tab.   Past Medical History:  Diagnosis Date   Acute GI bleeding 06/27/2021   Anemia 01/29/2016   Atrial fibrillation ([place])    post op.  s/p dc-cv 04/2008. 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  a- Echo 07/2008 Ef 55% mild peroprosthetic MVR with High transmitral gradient (mean 14).  b- normal coronaries by cath 12/2007   BPH (benign prostatic hyperplasia)    Cardiac resynchronization therapy pacemaker (CRT-P) in place 06/22/2011   CHF (congestive heart failure) (HCC)    EF 35-40 % 2011 due to valvular disease and diastolic dysfunction   Chronic back pain    CKD (chronic kidney disease), stage III ([place])    COVID-19 virus infection 07/24/2020   Tested positive on July 16, 2020   Dysphagia    with normal barium swallow 09/2008   Endocarditis    GERD (gastroesophageal reflux disease)    Heavy alcohol use    history   History of atrial flutter 03/13/2008   History of atrial flutter - resolved.    History of GI bleed    Hypertension    IV drug abuse ([place])    history of   Lower GI bleed 01/2016   Pacemaker 2010   Review of Systems:  positive for leg swelling, nocturnal urination, shortness of breath with activity, orthopnea; negative for PND  Physical Exam: Gen: elderly man in NAD HEENT: normocephalic atraumatic, MMM, neck supple CV: RRR, no m/r/g; 2+ pedal edema of the lower legs, 1+ dependent edema of the thighs Resp: crackles of the lung bases bilaterally GI: soft, nontender MSK: moves all extremities without difficulty Skin:warm and dry Neuro:alert answering questions appropriately;  ambulating with cane Psych: normal affect   Assessment & Plan:   See Encounters Tab for problem based charting.  Patient discussed with Dr.  Cain Sieve     Internal Medicine Clinic Attending  Case discussed with Dr. Ileene Musa  At the time of the visit.  We reviewed the residents history and exam and pertinent patient test results.  I agree with the assessment, diagnosis, and plan of care documented in the residents note.

## 2021-08-29 NOTE — Assessment & Plan Note (Signed)
Patient came in to visit today with a medication list from February 2022. I am not sure that he is able to manage his medications effectively by himself. Did not have time to complete a MOCA today but organized his pills in a pill organizer for 1 week. He did not have enough pills on hand to fill up a 2 week supply. - refills sent, follow up in 1 week - consider prepackaged pills for him - try to complete a MOCA at next visit

## 2021-08-29 NOTE — Assessment & Plan Note (Signed)
BMP recently checked at urgent care ED. Recheck BMP in 1 week at follow up

## 2021-09-02 NOTE — Progress Notes (Signed)
Remote pacemaker transmission.   

## 2021-09-03 ENCOUNTER — Other Ambulatory Visit: Payer: Self-pay | Admitting: Internal Medicine

## 2021-09-04 ENCOUNTER — Telehealth: Payer: Self-pay | Admitting: *Deleted

## 2021-09-04 NOTE — Telephone Encounter (Signed)
Patient's friend called in stating patient is "weak, falling asleep all the time, eyes are weak looking." States patient is "up half the night walking and going to the bathroom." Does not know if these sx were discussed with Provider at OV on 2/24. She made appt for 3/7 at 0945 to have these sx evaluated. ?

## 2021-09-08 ENCOUNTER — Ambulatory Visit: Payer: 59

## 2021-09-08 ENCOUNTER — Encounter: Payer: 59 | Admitting: Internal Medicine

## 2021-09-09 ENCOUNTER — Encounter: Payer: Self-pay | Admitting: Internal Medicine

## 2021-09-09 ENCOUNTER — Other Ambulatory Visit: Payer: Self-pay

## 2021-09-09 ENCOUNTER — Ambulatory Visit (HOSPITAL_COMMUNITY)
Admission: RE | Admit: 2021-09-09 | Discharge: 2021-09-09 | Disposition: A | Discharge status: Home / Self Care | Payer: 59 | Source: Ambulatory Visit | Attending: Family Medicine | Admitting: Family Medicine

## 2021-09-09 ENCOUNTER — Ambulatory Visit (INDEPENDENT_AMBULATORY_CARE_PROVIDER_SITE_OTHER): Payer: 59 | Admitting: Internal Medicine

## 2021-09-09 ENCOUNTER — Inpatient Hospital Stay (HOSPITAL_COMMUNITY)
Admission: AD | Admit: 2021-09-09 | Discharge: 2021-09-11 | DRG: 291 | Disposition: A | Discharge status: Home / Self Care | Payer: 59 | Source: Ambulatory Visit | Attending: Internal Medicine | Admitting: Internal Medicine

## 2021-09-09 ENCOUNTER — Observation Stay (HOSPITAL_COMMUNITY): Payer: 59

## 2021-09-09 VITALS — BP 129/71 | HR 99 | Temp 98.0°F | Ht 68.0 in | Wt 195.3 lb

## 2021-09-09 DIAGNOSIS — Z7901 Long term (current) use of anticoagulants: Secondary | ICD-10-CM

## 2021-09-09 DIAGNOSIS — I5022 Chronic systolic (congestive) heart failure: Secondary | ICD-10-CM | POA: Diagnosis not present

## 2021-09-09 DIAGNOSIS — N1831 Chronic kidney disease, stage 3a: Secondary | ICD-10-CM | POA: Diagnosis present

## 2021-09-09 DIAGNOSIS — I440 Atrioventricular block, first degree: Secondary | ICD-10-CM | POA: Diagnosis present

## 2021-09-09 DIAGNOSIS — L259 Unspecified contact dermatitis, unspecified cause: Secondary | ICD-10-CM | POA: Insufficient documentation

## 2021-09-09 DIAGNOSIS — N401 Enlarged prostate with lower urinary tract symptoms: Secondary | ICD-10-CM

## 2021-09-09 DIAGNOSIS — I509 Heart failure, unspecified: Secondary | ICD-10-CM

## 2021-09-09 DIAGNOSIS — Z95810 Presence of automatic (implantable) cardiac defibrillator: Secondary | ICD-10-CM

## 2021-09-09 DIAGNOSIS — Z8616 Personal history of COVID-19: Secondary | ICD-10-CM

## 2021-09-09 DIAGNOSIS — R351 Nocturia: Secondary | ICD-10-CM

## 2021-09-09 DIAGNOSIS — K922 Gastrointestinal hemorrhage, unspecified: Secondary | ICD-10-CM

## 2021-09-09 DIAGNOSIS — L2489 Irritant contact dermatitis due to other agents: Secondary | ICD-10-CM

## 2021-09-09 DIAGNOSIS — Z79899 Other long term (current) drug therapy: Secondary | ICD-10-CM

## 2021-09-09 DIAGNOSIS — I499 Cardiac arrhythmia, unspecified: Secondary | ICD-10-CM

## 2021-09-09 DIAGNOSIS — I5023 Acute on chronic systolic (congestive) heart failure: Secondary | ICD-10-CM | POA: Diagnosis present

## 2021-09-09 DIAGNOSIS — I4892 Unspecified atrial flutter: Secondary | ICD-10-CM

## 2021-09-09 DIAGNOSIS — I4891 Unspecified atrial fibrillation: Secondary | ICD-10-CM | POA: Diagnosis present

## 2021-09-09 DIAGNOSIS — K219 Gastro-esophageal reflux disease without esophagitis: Secondary | ICD-10-CM | POA: Diagnosis present

## 2021-09-09 DIAGNOSIS — Z952 Presence of prosthetic heart valve: Secondary | ICD-10-CM

## 2021-09-09 DIAGNOSIS — I484 Atypical atrial flutter: Secondary | ICD-10-CM | POA: Diagnosis present

## 2021-09-09 DIAGNOSIS — I130 Hypertensive heart and chronic kidney disease with heart failure and stage 1 through stage 4 chronic kidney disease, or unspecified chronic kidney disease: Principal | ICD-10-CM | POA: Diagnosis present

## 2021-09-09 LAB — CBC
HCT: 33.9 % — ABNORMAL LOW (ref 39.0–52.0)
Hemoglobin: 9.3 g/dL — ABNORMAL LOW (ref 13.0–17.0)
MCH: 21.2 pg — ABNORMAL LOW (ref 26.0–34.0)
MCHC: 27.4 g/dL — ABNORMAL LOW (ref 30.0–36.0)
MCV: 77.2 fL — ABNORMAL LOW (ref 80.0–100.0)
Platelets: 356 10*3/uL (ref 150–400)
RBC: 4.39 MIL/uL (ref 4.22–5.81)
RDW: 19.6 % — ABNORMAL HIGH (ref 11.5–15.5)
WBC: 6.7 10*3/uL (ref 4.0–10.5)
nRBC: 0 % (ref 0.0–0.2)

## 2021-09-09 LAB — BASIC METABOLIC PANEL
Anion gap: 10 (ref 5–15)
BUN: 30 mg/dL — ABNORMAL HIGH (ref 8–23)
CO2: 25 mmol/L (ref 22–32)
Calcium: 8.9 mg/dL (ref 8.9–10.3)
Chloride: 104 mmol/L (ref 98–111)
Creatinine, Ser: 1.89 mg/dL — ABNORMAL HIGH (ref 0.61–1.24)
GFR, Estimated: 36 mL/min — ABNORMAL LOW (ref >60)
Glucose, Bld: 107 mg/dL — ABNORMAL HIGH (ref 70–99)
Potassium: 3.3 mmol/L — ABNORMAL LOW (ref 3.5–5.1)
Sodium: 139 mmol/L (ref 135–145)

## 2021-09-09 LAB — PROTIME-INR
INR: 1.8 — ABNORMAL HIGH (ref 0.8–1.2)
Prothrombin Time: 20.6 s — ABNORMAL HIGH (ref 11.4–15.2)

## 2021-09-09 LAB — PSA: Prostatic Specific Antigen: 2.56 ng/mL (ref 0.00–4.00)

## 2021-09-09 LAB — TSH: TSH: 1.1 u[IU]/mL (ref 0.350–4.500)

## 2021-09-09 LAB — BRAIN NATRIURETIC PEPTIDE: B Natriuretic Peptide: 174.9 pg/mL — ABNORMAL HIGH (ref 0.0–100.0)

## 2021-09-09 MED ORDER — CARVEDILOL 6.25 MG PO TABS
6.2500 mg | ORAL_TABLET | Freq: Two times a day (BID) | ORAL | Status: DC
  Administered 2021-09-10 – 2021-09-11 (×3): 6.25 mg via ORAL
  Filled 2021-09-09 (×3): qty 1

## 2021-09-09 MED ORDER — ENOXAPARIN SODIUM 40 MG/0.4ML IJ SOSY: 40.0000 mg | PREFILLED_SYRINGE | INTRAMUSCULAR | Status: DC

## 2021-09-09 MED ORDER — FUROSEMIDE 10 MG/ML IJ SOLN
40.0000 mg | Freq: Every day | INTRAMUSCULAR | Status: DC
  Administered 2021-09-09 – 2021-09-10 (×2): 40 mg via INTRAVENOUS
  Filled 2021-09-09 (×2): qty 4

## 2021-09-09 MED ORDER — WARFARIN SODIUM 1 MG PO TABS
1.0000 mg | ORAL_TABLET | ORAL | Status: AC
  Administered 2021-09-09: 1 mg via ORAL
  Filled 2021-09-09 (×2): qty 1

## 2021-09-09 MED ORDER — TAMSULOSIN HCL 0.4 MG PO CAPS
0.8000 mg | ORAL_CAPSULE | Freq: Every day | ORAL | Status: DC
  Administered 2021-09-10 – 2021-09-11 (×2): 0.8 mg via ORAL
  Filled 2021-09-09 (×2): qty 2

## 2021-09-09 MED ORDER — TORSEMIDE 20 MG PO TABS: 20.0000 mg | ORAL_TABLET | Freq: Two times a day (BID) | ORAL | 1 refills | Status: DC

## 2021-09-09 MED ORDER — POTASSIUM CHLORIDE CRYS ER 20 MEQ PO TBCR
40.0000 meq | EXTENDED_RELEASE_TABLET | Freq: Two times a day (BID) | ORAL | Status: AC
Start: 2021-09-09 — End: 2021-09-10
  Administered 2021-09-09 – 2021-09-10 (×2): 40 meq via ORAL
  Filled 2021-09-09 (×2): qty 2

## 2021-09-09 MED ORDER — BISACODYL 5 MG PO TBEC
5.0000 mg | DELAYED_RELEASE_TABLET | Freq: Every day | ORAL | Status: DC | PRN
Start: 2021-09-09 — End: 2021-09-11

## 2021-09-09 MED ORDER — PANTOPRAZOLE SODIUM 40 MG PO TBEC
40.0000 mg | DELAYED_RELEASE_TABLET | Freq: Two times a day (BID) | ORAL | Status: DC
  Administered 2021-09-10 – 2021-09-11 (×3): 40 mg via ORAL
  Filled 2021-09-09 (×3): qty 1

## 2021-09-09 MED ORDER — POLYETHYLENE GLYCOL 3350 17 G PO PACK: 17.0000 g | PACK | Freq: Every day | ORAL | Status: DC | PRN

## 2021-09-09 MED ORDER — ACETAMINOPHEN 650 MG RE SUPP: 650.0000 mg | Freq: Four times a day (QID) | RECTAL | Status: DC | PRN

## 2021-09-09 MED ORDER — WARFARIN - PHARMACIST DOSING INPATIENT
Freq: Every day | Status: DC
Start: 2021-09-10 — End: 2021-09-11

## 2021-09-09 MED ORDER — ACETAMINOPHEN 325 MG PO TABS
650.0000 mg | ORAL_TABLET | Freq: Four times a day (QID) | ORAL | Status: DC | PRN
  Administered 2021-09-10: 650 mg via ORAL
  Filled 2021-09-09: qty 2

## 2021-09-09 NOTE — Progress Notes (Signed)
ANTICOAGULATION CONSULT NOTE - Initial Consult ? ?Pharmacy Consult:  Coumadin ?Indication: atrial fibrillation ? ?No Known Allergies ? ?Patient Measurements: ?Height: 5\' 8"  (172.7 cm) ?Weight: 88 kg (194 lb 0.1 oz) ?IBW/kg (Calculated) : 68.4 ? ?Vital Signs: ?Temp: 98.7 ?F (37.1 ?C) (03/07 1721) ?Temp Source: Oral (03/07 1721) ?BP: 125/66 (03/07 1721) ?Pulse Rate: 92 (03/07 1721) ? ?Labs: ?Recent Labs  ?  09/09/21 ?1132 09/09/21 ?1905  ?HGB 9.3*  --   ?HCT 33.9*  --   ?PLT 356  --   ?LABPROT  --  20.6*  ?INR  --  1.8*  ?CREATININE 1.89*  --   ? ? ?Estimated Creatinine Clearance: 35.3 mL/min (A) (by C-G formula based on SCr of 1.89 mg/dL (H)). ? ? ?Medical History: ?Past Medical History:  ?Diagnosis Date  ? Acute GI bleeding 06/27/2021  ? Anemia 01/29/2016  ? Atrial fibrillation ([place])   ? post op.  s/p dc-cv 04/2008. previously on amiodarone  ? Atrial flutter ([place])   ? atypical  ? AV block, 1st degree   ? .450 msec--progressive  ? 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  a- Echo 07/2008 Ef 55% mild peroprosthetic MVR with High transmitral gradient (mean 14).  b- normal coronaries by cath 12/2007  ? BPH (benign prostatic hyperplasia)   ? Cardiac resynchronization therapy pacemaker (CRT-P) in place 06/22/2011  ? CHF (congestive heart failure) ([place])   ? EF 35-40 % 2011 due to valvular disease and diastolic dysfunction  ? Chronic back pain   ? CKD (chronic kidney disease), stage III ([place])   ? COVID-19 virus infection 07/24/2020  ? Tested positive on July 16, 2020  ? Dysphagia   ? with normal barium swallow 09/2008  ? Endocarditis   ? GERD (gastroesophageal reflux disease)   ? Heavy alcohol use   ? history  ? History of atrial flutter 03/13/2008  ? History of atrial flutter - resolved.   ? History of GI bleed   ? Hypertension   ? IV drug abuse ([place])   ? history of  ? Lower GI bleed 01/2016  ? Pacemaker 2010  ? ? ? ?Assessment: ?42 YOM presented with fatigue and LE edema being diuresed.   He also has a history of Afib on Coumadin PTA and Pharmacy asked to continue med while hospitalized.  INR sub-therapeutic at 1.8 on admit.  Noted patient has a GIB in Dec 2022. ? ?Patient has an old instruction sheet for Coumadin dosing from February 6th's appointment.  Since then, he was at [facility] and his Coumadin dose was adjusted for INR of 3.8 to 2.5mg  PO daily except 5mg  on Thurs, Sat and Sun. ? ?Goal of Therapy:  ?INR 2-3 ?Monitor platelets by anticoagulation protocol: Yes ?  ?Plan:  ?Per patient, he already took today's dose, which would have been 2.5mg  ?Give an additional Coumadin 1mg  PO now ?Daily PT / INR ? ?Mindel Friscia D. Mina Marble, PharmD, BCPS, BCCCP ?09/09/2021, 8:37 PM ? ? ? ?

## 2021-09-09 NOTE — Hospital Course (Addendum)
Acute CHF exacerbation ?Patient presented with LE edema, weakness, and positional dyspnea. He did not have new oxygen requirements at time of admission. Labs were remarkable for elevated BUN/Cr to 30/1.89 with GFR of 36 though this is roughly his baseline. BNP mildly elevated from most recent value in 02/17 to 174.9. He was admitted and started on IV Lasix 40 mg daily. Chest x-ray showed cardiomegaly with central pulmonary vascular congestion, new bibasilar infiltrates likely representing mild edema. Echocardiogram obtained and was largely unchanged from 07/2019, showing LV EV 35-40% with moderately decreased function and regional wall motion abnormalities and mildly dilated internal cavity with moderate concentric hypertrophy; severe dyskinesis of LV inferior wall; RV systolic function moderately reduced with normal size; mildly elevated pulmonary artery systolic pressure; severely dilated LA; moderately dilated RA; no MV regurgitation; mild calcification of the AV with mild regurgitation and sclerosis without stenosis. Lasix was increased to BID dosing with appropriate diuresis and decrease in weight and improvement of physical exam findings. ?  ?HTN ?Normotensive throughout admission. Home lisinopril was held on admission. ?  ?History of atrial fibrillation ?History of atrial flutter ?History of 1st degree AV block ?Hx mitral valve replacement with mechanical valve ?INR was subtherapeutic at 1.8, with goal of 2-3. Pharmacy assisted in dosing. Coreg was continued for atrial fibrillation with rate control. ?  ?Hx CKD 3a ?BUN/Cr and GFR relatively unchanged from baseline throughout admission. ?  ?Hx BPH with nocturia ?PSA normal. He was continued on home Flomax. ?

## 2021-09-09 NOTE — H&P (Signed)
? ? ? ?Date: 09/09/2021     ?     ?     ?Patient Name:  [name] MRN: [id number]  ?DOB: 1945-11-16 Age / Sex: 76 y.o., male   ?PCP: [name], MD    ?     ?Medical Service: Internal Medicine Teaching Service    ?     ?Attending Physician: Dr. Aldine Contes, MD    ?First Contact: Dr. Farrel Gordon Pager: [phone]  ?Second Contact: Dr. Linwood Dibbles Pager: [phone]  ?     ?After Hours (After 5p/  First Contact Pager: [phone]  ?weekends / holidays): Second Contact Pager: [phone]  ? ?Chief Complaint: weakness ? ?History of Present Illness: [name] is a 76 y.o. male with pertinent PMH of atrial flutter/fibrillation, 1st degree AV block, BPH, CHF, CKD IIIa, HTN, ICD placement, mechanical mitral valve, who is a direct admission from [facility] for acute heart failure exacerbation where he presented today with increased weakness over the last week. He says that over the last three weeks or so he has noticed increased LE edema and dyspnea when bending over to pick something up. He denies orthopnea or generalized dyspnea. No recent medication changes other than Protonix, no dietary changes, and no recent illnesses. He does not weigh himself at home and denies feeling increased abdominal tightness or that his clothes are tighter than normal. Denies chest pain or palpitations, current headache but does experience them at times. ? ?Of note he was asked to increase Torsemide to BID dosing in February but has continued taking it once daily.  ? ?Meds:  ?Current Meds  ?Medication Sig  ? acetaminophen (TYLENOL) 500 MG tablet Take 1,000 mg by mouth every 6 (six) hours as needed (pain).  ? carvedilol (COREG) 6.25 MG tablet Take 1 tablet (6.25 mg total) by mouth 2 (two) times daily with a meal.  ? lisinopril (ZESTRIL) 2.5 MG tablet Take 1 tablet (2.5 mg total) by mouth daily. NEEDS APPOINTMENT FOR ANY MORE REFILLS (Patient taking differently: Take 2.5 mg by mouth daily.)  ? pantoprazole (PROTONIX) 40 MG tablet TAKE 1  TABLET (40 MG TOTAL) BY MOUTH TWICE A DAY BEFORE MEALS (Patient taking differently: Take 40 mg by mouth 2 (two) times daily before a meal.)  ? tamsulosin (FLOMAX) 0.4 MG CAPS capsule Take 2 capsules (0.8 mg total) by mouth daily after breakfast.  ? torsemide (DEMADEX) 20 MG tablet Take 1 tablet (20 mg total) by mouth 2 (two) times daily.  ? warfarin (COUMADIN) 5 MG tablet TAKE ONE TABLET (5 MG) BY MOUTH DAILY UNTIL INR CHECK ON 07/08/21 (Patient taking differently: Take 5 mg by mouth See admin instructions. Take 2.5 mg tablet by mouth on Tuesdays, Wednesdays, and Fridays. Then take whole tablet rest of days)  ? ? ? ?Allergies: ?Allergies as of 09/09/2021  ? (No Known Allergies)  ? ?Past Medical History:  ?Diagnosis Date  ? Acute GI bleeding 06/27/2021  ? Anemia 01/29/2016  ? Atrial fibrillation ([place])   ? post op.  s/p dc-cv 04/2008. previously on amiodarone  ? Atrial flutter ([place])   ? atypical  ? AV block, 1st degree   ? .450 msec--progressive  ? 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  a- Echo 07/2008 Ef 55% mild peroprosthetic MVR with High transmitral gradient (mean 14).  b- normal coronaries by cath 12/2007  ? BPH (benign prostatic hyperplasia)   ? Cardiac resynchronization therapy pacemaker (CRT-P) in place 06/22/2011  ?  CHF (congestive heart failure) ([place])   ? EF 35-40 % 2011 due to valvular disease and diastolic dysfunction  ? Chronic back pain   ? CKD (chronic kidney disease), stage III ([place])   ? COVID-19 virus infection 07/24/2020  ? Tested positive on July 16, 2020  ? Dysphagia   ? with normal barium swallow 09/2008  ? Endocarditis   ? GERD (gastroesophageal reflux disease)   ? Heavy alcohol use   ? history  ? History of atrial flutter 03/13/2008  ? History of atrial flutter - resolved.   ? History of GI bleed   ? Hypertension   ? IV drug abuse ([place])   ? history of  ? Lower GI bleed 01/2016  ? Pacemaker 2010  ? ? ?Family History: Two brothers had cancer of unknown type. No  other history reported. ? ?Social History: Mr. Burruel lives with his friend and their daughter locally. He takes care of his own ADLs/IADLs. He is followed in the [facility].  ? ?Review of Systems: ?A complete ROS was negative except as per HPI.  ? ?Physical Exam: ?Blood pressure 125/66, pulse 92, temperature 98.7 ?F (37.1 ?C), temperature source Oral, resp. rate 18, height 5\' 8"  (1.727 m), weight 88 kg, SpO2 99 %. ?Constitutional:Elderly gentleman resting comfortably in bed. No acute distress. ?Cardio:Irregular rhythm with normal rate. Systolic click appreciated. JVP 3 cm. ?Pulm:Bibasilar crackles. Normal work of breathing on room air. ?Abdomen:Soft, non-tender, non-distended. ?TE:[id number] edema to bilateral LE to knees with 1+ pitting edema to bilateral knees. No edema above the knees. ?Skin:Skin is warm and dry. Over the LE it is tight and shiny without discoloration. ?Neuro:Alert and oriented x3. No focal deficit noted. ?Psych:Normal mood and affect. ? ?EKG: personally reviewed my interpretation is atrial fibrillation at 87 BPM with occasional PVC. ? ?CXR: personally reviewed my interpretation is no chest x-ray obtained at this time. ? ?Assessment & Plan by Problem: ?Principal Problem: ?  Acute exacerbation of CHF (congestive heart failure) (HCC) ? ?Acute CHF exacerbation ?Patient endorses three weeks of increased LE edema, weakness, and positional dyspnea. Labs prior to admission remarkable for elevated BUN/Cr at 30/1.89, GFR 36 (does not appear significantly elevated from baseline), BNP 174.9 (02/17 135.3). Last echocardiogram was in 07/2019 and showed LV EF 35-40% with moderately decreased function, severe dyskinesis of LF inferior wall, regional wall motion abnormalities, severely dilated LA. He was seen in [facility] in February and at that time was advised to take torsemide 20 mg twice daily, increased from daily but he has only taken it once daily still. Hemodynamically stable without increased work of breathing at  this time. ?-F/u echocardiogram ?-F/u chest x-ray ?-IV Lasix 40 mg daily ?-Strict I/O ?-Daily weights ? ?HTN ?Patient has not taken daily medications today. He is normotensive on exam.  ?-Hold home lisinopril ? ?History of atrial fibrillation ?History of atrial flutter ?History of 1st degree AV block ?Hx mitral valve replacement with mechanical valve ?Patient has an ICD placed and is on chronic warfarin therapy. On exam a systolic click was appreciated and patient had regular rhythm with irregular rate. He has not taken daily medications today. ?-Continue home coreg 6.25 mg BID ?-Warfarin per pharmacy ?-F/u PT-INR ? ?Hx CKD 3a ?Pre-admission labs reveal BUN/Cr 30/1.89 and GFR 36, not significantly changed from baseline creatinine of 1.7-1.8 and GFR 35-40 range.  ?-Trend renal function ?-Consider protein/creatinine ratio ?-Consider nephrology referral for CKD at discharge ? ?Hx BPH with nocturia ?Patient states that he gets up several times per  night to urinate. I do not see a PSA since 02/2019. Currently managed outpatient with flomax 0.8 mg daily. ?-F/u PSA ?-Continue home flomax ?-Consider urology referral at discharge ? ?Dispo: Admit patient to Observation with expected length of stay less than 2 midnights. ? ?Signed: ?Farrel Gordon, DO ?09/09/2021, 6:30 PM  ?Pager: [phone] ?After 5pm on weekdays and 1pm on weekends: On Call pager: [phone] ? ?

## 2021-09-09 NOTE — Assessment & Plan Note (Signed)
Patient states that he has been using rubbing alcohol on his legs.  He began using this whenever his legs started swelling and hopes to help with the pain.  On physical exam, there is erythema and irritation to medial surface of right knee, left shin has dry appearance of skin. ?A/P: ?Asked patient to stop using rubbing alcohol on legs. Will follow up after hospitalization. ?

## 2021-09-09 NOTE — Assessment & Plan Note (Addendum)
Patient endorses continued problems with nocturia.  He states that he wakes up 3-4 times nightly to use the restroom.  He denies urgency, incomplete emptying, or straining. Patient seen by urology 2019, CT urogram showed mildly enlarged prostate at that time.  Cystoscopy completed 5- 2019 and was unremarkable. Patient endorses taking flomax 0.8 mg currently. He has previouslt be on finasteride, but is not currently taking it.  ?A/P: ?Consider PSA at follow-up visit ?If elevated referral to urology ? ?

## 2021-09-09 NOTE — Progress Notes (Signed)
? ? ?Subjective:  ?CC: fatigue, lower extremity edema ? ?HPI: ? ?[name] is a 76 y.o. male with a past medical history stated below and presents today due to fatigue and lower extremity edema. Please see problem based assessment and plan for additional details. ? ?Past Medical History:  ?Diagnosis Date  ? Acute GI bleeding 06/27/2021  ? Anemia 01/29/2016  ? Atrial fibrillation ([place])   ? post op.  s/p dc-cv 04/2008. previously on amiodarone  ? Atrial flutter ([place])   ? atypical  ? AV block, 1st degree   ? .450 msec--progressive  ? 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  a- Echo 07/2008 Ef 55% mild peroprosthetic MVR with High transmitral gradient (mean 14).  b- normal coronaries by cath 12/2007  ? BPH (benign prostatic hyperplasia)   ? Cardiac resynchronization therapy pacemaker (CRT-P) in place 06/22/2011  ? CHF (congestive heart failure) ([place])   ? EF 35-40 % 2011 due to valvular disease and diastolic dysfunction  ? Chronic back pain   ? CKD (chronic kidney disease), stage III ([place])   ? COVID-19 virus infection 07/24/2020  ? Tested positive on July 16, 2020  ? Dysphagia   ? with normal barium swallow 09/2008  ? Endocarditis   ? GERD (gastroesophageal reflux disease)   ? Heavy alcohol use   ? history  ? History of atrial flutter 03/13/2008  ? History of atrial flutter - resolved.   ? History of GI bleed   ? Hypertension   ? IV drug abuse ([place])   ? history of  ? Lower GI bleed 01/2016  ? Pacemaker 2010  ? ? ?Current Outpatient Medications on File Prior to Visit  ?Medication Sig Dispense Refill  ? acetaminophen (TYLENOL) 500 MG tablet Take 1,000 mg by mouth every 6 (six) hours as needed (pain).    ? carvedilol (COREG) 6.25 MG tablet Take 1 tablet (6.25 mg total) by mouth 2 (two) times daily with a meal. 180 tablet 3  ? diclofenac Sodium (VOLTAREN) 1 % GEL APPLY 4 G TOPICALLY 4 TIMES DAILY (Patient not taking: Reported on 06/27/2021) 400 g 11  ? lisinopril (ZESTRIL) 2.5 MG  tablet Take 1 tablet (2.5 mg total) by mouth daily. NEEDS APPOINTMENT FOR ANY MORE REFILLS (Patient taking differently: Take 2.5 mg by mouth daily.) 15 tablet 0  ? pantoprazole (PROTONIX) 40 MG tablet TAKE 1 TABLET (40 MG TOTAL) BY MOUTH TWICE A DAY BEFORE MEALS (Patient taking differently: Take 40 mg by mouth 2 (two) times daily before a meal.) 60 tablet 0  ? tamsulosin (FLOMAX) 0.4 MG CAPS capsule Take 2 capsules (0.8 mg total) by mouth daily after breakfast. 180 capsule 3  ? warfarin (COUMADIN) 5 MG tablet TAKE ONE TABLET (5 MG) BY MOUTH DAILY UNTIL INR CHECK ON 07/08/21 (Patient taking differently: Take 5 mg by mouth See admin instructions. Take 2.5 mg tablet by mouth on Tuesdays, Wednesdays, and Fridays. Then take whole tablet rest of days) 30 tablet 0  ? ?No current facility-administered medications on file prior to visit.  ? ? ?Family History  ?Problem Relation Age of Onset  ? Coronary artery disease Mother   ? Cancer Father   ?     GI  ? Cancer Brother   ?     Lung  ? ? ?Social History  ? ?Socioeconomic History  ? Marital status: Divorced  ?  Spouse name: Not on file  ? Number of children: 9  ?  Years of education: Not on file  ? Highest education level: Not on file  ?Occupational History  ? Occupation: retired  ?  Comment: [employer]  ?Tobacco Use  ? Smoking status: Former  ?  Years: 0.00  ?  Types: Cigarettes  ? Smokeless tobacco: Never  ?Vaping Use  ? Vaping Use: Never used  ?Substance and Sexual Activity  ? Alcohol use: No  ?  Alcohol/week: 0.0 standard drinks  ? Drug use: No  ? Sexual activity: Not Currently  ?Other Topics Concern  ? Not on file  ?Social History Narrative  ? Current Social History 02/16/2019    ?   ? Patient lives with family (baby's 87, daughter and 4 grandkids:  45 yo twins, 51 yo and 21 yo in a home which is 1 story. There are 4 steps with handrails up to the entrance the patient uses.   ?   ? Patient's method of transportation is via family member (baby's mama).  ?   ? The highest  level of education was high school diploma.  ?   ? The patient currently retired.  ?   ? Identified important Relationships are "My 48 Joselyn Arrow, [name]."   ?   ? Pets : None  ?    ? Interests / Fun: Sitting outdoors watching the birds and planes."   ?   ? Exercise riding a bike 5 miles 3 days/week  ?   ? Current Stressors: The grandkids   ?   ? Religious / Personal Beliefs: "I believe in God."   ?   ? L. [place], RN, BSN   ?   ? ?Social Determinants of Health  ? ?Financial Resource Strain: Not on file  ?Food Insecurity: Not on file  ?Transportation Needs: Not on file  ?Physical Activity: Not on file  ?Stress: Not on file  ?Social Connections: Not on file  ?Intimate Partner Violence: Not on file  ? ? ?Review of Systems: ?ROS negative except for what is noted on the assessment and plan. ? ?Objective:  ? ?Vitals:  ? 09/09/21 1007  ?BP: 129/71  ?Pulse: 99  ?Temp: 98 ?F (36.7 ?C)  ?TempSrc: Oral  ?SpO2: 100%  ?Weight: 195 lb 4.8 oz (88.6 kg)  ?Height: 5\' 8"  (1.727 m)  ? ? ?Physical Exam: ?Gen: A&O x3 and in no apparent distress, appears tired ?HEENT:  ?  Eye - visual acuity grossly intact, conjunctiva clear, sclera non-icteric, EOM intact.  ?  Mouth - No obvious caries or periodontal disease. ?Neck: no masses or nodules, AROM intact. ?CV: irregularly irregular rhythm, no murmurs, S1/S2 presents  ?Resp: Clear to auscultation bilaterally, normal work of breathing on room air  ?Abd: BS (+) x4, soft, non-tender, non-distended ?MSK: Grossly normal AROM and strength x4 extremities. ?Skin: 2+pitting edema in lower extremities bilaterally to mid shin, erythema present at medial surface of right knee, dry skin present on left shin without erythema  ?Neuro: No focal deficits, grossly normal sensation and coordination.  ?Psych: Oriented x3 and responding appropriately. Intact memory, normal mood, judgement, affect, and insight.  ? ? ?Assessment & Plan:  ?See Encounters Tab for problem based charting. ? ?Patient discussed with  Dr. Philipp Ovens ? ? ?Christiana Fuchs, D.O. ?[facility]  PGY-1 ?Pager: [phone]  Phone: [phone] ?Date 09/09/2021  Time 6:15 PM  ?

## 2021-09-09 NOTE — Assessment & Plan Note (Addendum)
Patient reports no further hematochezia or melena. He endorses worsening fatigue over the last week. Hgb improved from prior at 9.3. ?A/P: ?CBC completed ?

## 2021-09-09 NOTE — Patient Instructions (Addendum)
[name], it was a pleasure seeing you today! ? ?Today we discussed: ?Swelling in legs ?You have fluid in your legs and body that is making you not feel well. This is from your heart not pumping at a normal amount. I am checking some labs as well as an EKG. If your labs are abnormal, you may need some IV medication to help get fluids off of you. I will call you if labs are abnormal with further instructions. If labs look ok, then I would like you to increase torsemide to taking it two times per day. Please follow-up in 2 weeks. ? ?Changes: Increase torsemide to 20 mg twice daily. ? ?I have ordered the following labs today: ? ?Lab Orders    ?     Brain natriuretic peptide    ?     CBC no Diff    ?     BMP w Anion Gap (STAT/Sunquest-performed on-site)     ? ?I will call if any are abnormal. All of your labs can be accessed through "My Chart" ?  ?My Chart Access: ?[email or web address] ? ?Tests ordered today: ? ?EKG ? ?Referrals ordered today:  ? ?Referral Orders  ?No referral(s) requested today  ?  ? ?I have ordered the following medication/changed the following medications:  ? ?Stop the following medications: ?Medications Discontinued During This Encounter  ?Medication Reason  ? torsemide (DEMADEX) 20 MG tablet   ?  ? ?Start the following medications: ?Meds ordered this encounter  ?Medications  ? torsemide (DEMADEX) 20 MG tablet  ?  Sig: Take 1 tablet (20 mg total) by mouth 2 (two) times daily.  ?  Dispense:  90 tablet  ?  Refill:  1  ?  ? ?Follow-up:  2 weeks   ? ?Please make sure to arrive 15 minutes prior to your next appointment. If you arrive late, you may be asked to reschedule.  ? ?We look forward to seeing you next time. Please call our clinic at [phone] if you have any questions or concerns. The best time to call is Monday-Friday from 9am-4pm, but there is someone available 24/7. If after hours or the weekend, call the main hospital number and ask for the  Internal Medicine Resident On-Call. If you need medication refills, please notify your pharmacy one week in advance and they will send [place] a request. ? ?Thank you for letting us take part in your care. Wishing you the best! ? ?Thank you, ?Dr. Sloan Leiter ?[facility]  ?

## 2021-09-09 NOTE — Assessment & Plan Note (Addendum)
Patient's friend encouraged him to come to doctor last week after noticing he was falling asleep more during the day and appeared tired. He denies orthopnea, endorses SOB with bending forward, and states that lower extremity edema is about what it has been. At hospital  discharge in Dec, patient weighed 174 lbs. He is about 20 lbs up since then. He states that he feels tired and has been falling asleep sitting up in the last week.  He endorses nocturia and has to get up to go to the bathroom 3-4 times nightly. Patient endorses taking medications, which includes torsemide 20 mg qd, coreg 6.25 mg, and lisinopril 2.5 mg. Torsemide was increased to BID dosing in February, but patient states he has been taking it once daily. Several of his medication bottles have the instructions worn off and I am concerned that he is not taking his medications as prescribed. He states that no one currently helps with his medications, but he does have a friend who has offered to help. He lives with his daughter. On physical exam, lungs are clear to auscultation bilaterally, stable oxygen saturation on room air, heart rhythm is irregular, patient has 2+ pitting edema in lower extremities bilaterally up to mid-shin. ?A/P: ?EKG showed atrial fibrillation with rate of HR of 87. Patient has permanent afib and is rate controlled on coreg. His presentation is consistent with acute heart failure exacerbation. He has been seen several times in clinic for this and he is having difficulty with outpatient therapy.  Will have patient direct admitted for IV diuretic therapy. Stat BMP and BNP reassuring.  Creatinine at 1.89, appears that baseline was 1.3 prior to hospitalization in 12/22.  ?

## 2021-09-10 ENCOUNTER — Observation Stay (HOSPITAL_COMMUNITY): Payer: 59

## 2021-09-10 ENCOUNTER — Encounter (HOSPITAL_COMMUNITY): Payer: Self-pay | Admitting: Internal Medicine

## 2021-09-10 DIAGNOSIS — I484 Atypical atrial flutter: Secondary | ICD-10-CM | POA: Diagnosis present

## 2021-09-10 DIAGNOSIS — I440 Atrioventricular block, first degree: Secondary | ICD-10-CM | POA: Diagnosis present

## 2021-09-10 DIAGNOSIS — N1831 Chronic kidney disease, stage 3a: Secondary | ICD-10-CM | POA: Diagnosis present

## 2021-09-10 DIAGNOSIS — K219 Gastro-esophageal reflux disease without esophagitis: Secondary | ICD-10-CM | POA: Diagnosis present

## 2021-09-10 DIAGNOSIS — I509 Heart failure, unspecified: Secondary | ICD-10-CM

## 2021-09-10 DIAGNOSIS — Z7901 Long term (current) use of anticoagulants: Secondary | ICD-10-CM | POA: Diagnosis not present

## 2021-09-10 DIAGNOSIS — Z8616 Personal history of COVID-19: Secondary | ICD-10-CM | POA: Diagnosis not present

## 2021-09-10 DIAGNOSIS — I130 Hypertensive heart and chronic kidney disease with heart failure and stage 1 through stage 4 chronic kidney disease, or unspecified chronic kidney disease: Secondary | ICD-10-CM | POA: Diagnosis present

## 2021-09-10 DIAGNOSIS — I10 Essential (primary) hypertension: Secondary | ICD-10-CM | POA: Diagnosis not present

## 2021-09-10 DIAGNOSIS — Z952 Presence of prosthetic heart valve: Secondary | ICD-10-CM | POA: Diagnosis not present

## 2021-09-10 DIAGNOSIS — Z79899 Other long term (current) drug therapy: Secondary | ICD-10-CM | POA: Diagnosis not present

## 2021-09-10 DIAGNOSIS — I5023 Acute on chronic systolic (congestive) heart failure: Secondary | ICD-10-CM | POA: Diagnosis present

## 2021-09-10 DIAGNOSIS — N401 Enlarged prostate with lower urinary tract symptoms: Secondary | ICD-10-CM | POA: Diagnosis present

## 2021-09-10 DIAGNOSIS — Z95810 Presence of automatic (implantable) cardiac defibrillator: Secondary | ICD-10-CM | POA: Diagnosis not present

## 2021-09-10 DIAGNOSIS — I4891 Unspecified atrial fibrillation: Secondary | ICD-10-CM | POA: Diagnosis present

## 2021-09-10 DIAGNOSIS — R351 Nocturia: Secondary | ICD-10-CM | POA: Diagnosis present

## 2021-09-10 LAB — COMPREHENSIVE METABOLIC PANEL
ALT: 14 U/L (ref 0–44)
AST: 29 U/L (ref 15–41)
Albumin: 3.7 g/dL (ref 3.5–5.0)
Alkaline Phosphatase: 53 U/L (ref 38–126)
Anion gap: 10 (ref 5–15)
BUN: 27 mg/dL — ABNORMAL HIGH (ref 8–23)
CO2: 24 mmol/L (ref 22–32)
Calcium: 9.1 mg/dL (ref 8.9–10.3)
Chloride: 105 mmol/L (ref 98–111)
Creatinine, Ser: 1.82 mg/dL — ABNORMAL HIGH (ref 0.61–1.24)
GFR, Estimated: 38 mL/min — ABNORMAL LOW (ref >60)
Glucose, Bld: 89 mg/dL (ref 70–99)
Potassium: 3.4 mmol/L — ABNORMAL LOW (ref 3.5–5.1)
Sodium: 139 mmol/L (ref 135–145)
Total Bilirubin: 0.9 mg/dL (ref 0.3–1.2)
Total Protein: 6.8 g/dL (ref 6.5–8.1)

## 2021-09-10 LAB — PROTIME-INR
INR: 1.8 — ABNORMAL HIGH (ref 0.8–1.2)
Prothrombin Time: 21.1 seconds — ABNORMAL HIGH (ref 11.4–15.2)

## 2021-09-10 LAB — ECHOCARDIOGRAM COMPLETE
AR max vel: 2.8 cm2
AV Peak grad: 3.9 mmHg
Ao pk vel: 0.99 m/s
Area-P 1/2: 2.69 cm2
Height: 68 in
MV VTI: 1.19 cm2
P 1/2 time: 527 msec
S' Lateral: 3.92 cm
Weight: 2931.2 oz

## 2021-09-10 LAB — CBC
HCT: 32.7 % — ABNORMAL LOW (ref 39.0–52.0)
Hemoglobin: 9.3 g/dL — ABNORMAL LOW (ref 13.0–17.0)
MCH: 21.4 pg — ABNORMAL LOW (ref 26.0–34.0)
MCHC: 28.4 g/dL — ABNORMAL LOW (ref 30.0–36.0)
MCV: 75.2 fL — ABNORMAL LOW (ref 80.0–100.0)
Platelets: 397 10*3/uL (ref 150–400)
RBC: 4.35 MIL/uL (ref 4.22–5.81)
RDW: 19.6 % — ABNORMAL HIGH (ref 11.5–15.5)
WBC: 7.6 10*3/uL (ref 4.0–10.5)
nRBC: 0 % (ref 0.0–0.2)

## 2021-09-10 MED ORDER — FUROSEMIDE 10 MG/ML IJ SOLN
40.0000 mg | Freq: Two times a day (BID) | INTRAMUSCULAR | Status: DC
  Administered 2021-09-10 – 2021-09-11 (×2): 40 mg via INTRAVENOUS
  Filled 2021-09-10 (×3): qty 4

## 2021-09-10 MED ORDER — WARFARIN SODIUM 5 MG PO TABS
5.0000 mg | ORAL_TABLET | Freq: Once | ORAL | Status: AC
  Administered 2021-09-10: 5 mg via ORAL
  Filled 2021-09-10: qty 1

## 2021-09-10 MED ORDER — POTASSIUM CHLORIDE CRYS ER 20 MEQ PO TBCR
40.0000 meq | EXTENDED_RELEASE_TABLET | Freq: Once | ORAL | Status: AC
  Administered 2021-09-10: 40 meq via ORAL
  Filled 2021-09-10: qty 2

## 2021-09-10 NOTE — Progress Notes (Signed)
Mobility Specialist Progress Note: ? ? 09/10/21 1330  ?Mobility  ?Activity Ambulated with assistance in hallway  ?Level of Assistance Modified independent, requires aide device or extra time  ?Assistive Device [place]  ?Distance Ambulated (ft) 470 ft  ?Activity Response Tolerated well  ?$Mobility charge 1 Mobility  ? ?Pt asx during ambulation. Required no physical assist throughout. Pt left in room with all needs met.  ? ?Nelta Numbers ?Acute Rehab ?Phone: [phone] ?Office Phone: [phone] ? ?

## 2021-09-10 NOTE — TOC Progression Note (Signed)
Transition of Care (TOC) - Progression Note  ? ? ?Patient Details  ?Name: [name] ?MRN: [id number] ?Date of Birth: 05/30/1946 ? ?Transition of Care (TOC) CM/SW Contact  ?Leone Haven, RN ?Phone Number: ?09/10/2021, 11:31 AM ? ?Clinical Narrative:    ? from home with a friend, Acute CHF ex, conts on iv lasix, having lots of falls at home. Will benefit from pt eval. TOC will continue to follow for dc needs.  ? ? ?  ?  ? ?Expected Discharge Plan and Services ?  ?  ?  ?  ?  ?                ?  ?  ?  ?  ?  ?  ?  ?  ?  ?  ? ? ?Social Determinants of Health (SDOH) Interventions ?  ? ?Readmission Risk Interventions ?No flowsheet data found. ? ?

## 2021-09-10 NOTE — Progress Notes (Signed)
? ?HD#0 ?SUBJECTIVE:  ?Patient Summary: [name] is a 76 y.o. male with pertinent PMH of atrial flutter/fibrillation, 1st degree AV block, BPH, CHF, CKD IIIa, HTN, ICD placement, mechanical mitral valve, who is a direct admission from [facility] for acute heart failure exacerbation where he presented today with increased weakness over the last week and admitted for acute on chronic CHF exacerbation. ? ?Overnight Events: None ? ?Interim History: Patient reports feeling generally well this morning. He did not have much urination overnight but states that his legs do feel better this morning though they are still sore. Continues to deny chest pain, palpitations, dyspnea, abdominal pain. ? ?OBJECTIVE:  ?Vital Signs: ?Vitals:  ? 09/09/21 2037 09/10/21 0035 09/10/21 0504 09/10/21 0726  ?BP: (!) 111/58 (!) 110/50 101/66 102/71  ?Pulse: 99 92 94 89  ?Resp: 18 16    ?Temp: 98.5 ?F (36.9 ?C) 98.1 ?F (36.7 ?C) 98.3 ?F (36.8 ?C) 98.8 ?F (37.1 ?C)  ?TempSrc: Oral Oral Oral Oral  ?SpO2: 99% 98% 96% 98%  ?Weight:   83.1 kg   ?Height:      ? ?Supplemental O2: Room Air ?SpO2: 98 % ? ?Filed Weights  ? 09/09/21 1703 09/10/21 0504  ?Weight: 88 kg 83.1 kg  ? ? ? ?Intake/Output Summary (Last 24 hours) at 09/10/2021 1142 ?Last data filed at 09/10/2021 3646 ?Gross per 24 hour  ?Intake 717 ml  ?Output 2350 ml  ?Net -1633 ml  ? ?Net IO Since Admission: -1,633 mL [09/10/21 1142] ? ?Physical Exam: ?Constitutional:Elderly gentleman resting comfortably in bed. No acute distress. ?Cardio:Irregular rate with regular rhythm. Systolic click noted. JVD 1.5 cm. ?Pulm:Bibasilar crackles, improved from admission. ?Abdomen:Soft, nontender, nondistended. ?OEH:[id number] non-pitting LE edema to knees. No longer with pitting edema surrounding knees.  ?Skin:Warm and dry. Skin with some scaling over bilateral LE. ?Neuro:Alert and oriented x3. No focal deficit noted. ?Psych:Normal mood and affect. ? ?Patient Lines/Drains/Airways Status   ? ? Active  Line/Drains/Airways   ? ? Name Placement date Placement time Site Days  ? Peripheral IV 09/09/21 22 G 1" Anterior;Right Forearm 09/09/21  2044  Forearm  1  ? ?  ?  ? ?  ? ? ? ?ASSESSMENT/PLAN:  ?Assessment: ?Principal Problem: ?  Acute exacerbation of CHF (congestive heart failure) (HCC) ? ? ?Plan: ?Acute CHF exacerbation ?Patient managed overnight with IV Lasix 40 mg and is -4.9 kg, -1.8L from admission. Chest x-ray showed cardiomegaly with central pulmonary vascular congestion, new bibasilar infiltrates likely representing mild edema. Echocardiogram obtained and was largely unchanged from 07/2019, showing LV EV 35-40% with moderately decreased function and regional wall motion abnormalities and mildly dilated internal cavity with moderate concentric hypertrophy; severe dyskinesis of LV inferior wall; RV systolic function moderately reduced with normal size; mildly elevated pulmonary artery systolic pressure; severely dilated LA; moderately dilated RA; no MV regurgitation; mild calcification of the AV with mild regurgitation and sclerosis without stenosis. ?-Increase to IV Lasix 40 mg BID ?-Strict I/O ?-Daily weights ?-PT/OT consult ?-Consider changing regimen to Bidil or similarly acting agent(s) as outpatient ?  ?HTN ?Patient is normotensive. Home lisinopril held on admission. ?-Hold home lisinopril ?  ?History of atrial fibrillation ?History of atrial flutter ?History of 1st degree AV block ?Hx mitral valve replacement with mechanical valve ?INR is subtherapeutic at 1.8, with goal of 2-3. Pharmacy is assisting in dosing. Irregular rhythm with regular rate noted on exam. ?-Continue home coreg 6.25 mg BID ?-Warfarin per pharmacy ?  ?Hx CKD 3a ?BUN/Cr and GFR relatively unchanged  from admission, 27/1.82 (30/1.89) and 38 (36). Baseline appears to be creatinine of 1.7-1.8 and GFR 35-40 range.  ?-Trend renal function ?-Consider protein/creatinine ratio ?-Consider nephrology referral for CKD at discharge ?  ?Hx BPH  with nocturia ?Patient states that he gets up several times per night to urinate. PSA normal. Currently managed outpatient with flomax 0.8 mg daily. ?-Continue home flomax ?-Consider urology referral at discharge ? ?Best Practice: ?Diet: Renal diet ?IVF: None ?VTE: Warfarin, per pharmacy ?Code: Full ?AB: None ?DISPO: Anticipated discharge in 1-2 days to Home pending Medical stability. ? ?Signature: ?Champ Mungo, D.O.  ?Internal Medicine Resident, PGY-1 ?[facility]  ?Pager: [phone] ?11:42 AM, 09/10/2021  ? ?Please contact the on call pager after 5 pm and on weekends at [phone].  ?

## 2021-09-10 NOTE — Progress Notes (Signed)
ANTICOAGULATION CONSULT NOTE - Initial Consult ? ?Pharmacy Consult:  Coumadin ?Indication: atrial fibrillation ? ?No Known Allergies ? ?Patient Measurements: ?Height: 5\' 8"  (172.7 cm) ?Weight: 83.1 kg (183 lb 3.2 oz) ?IBW/kg (Calculated) : 68.4 ? ?Vital Signs: ?Temp: 97.6 ?F (36.4 ?C) (03/08 1151) ?Temp Source: Oral (03/08 1151) ?BP: 113/71 (03/08 1151) ?Pulse Rate: 92 (03/08 1151) ? ?Labs: ?Recent Labs  ?  09/09/21 ?1132 09/09/21 ?1905 09/10/21 ?0335  ?HGB 9.3*  --  9.3*  ?HCT 33.9*  --  32.7*  ?PLT 356  --  397  ?LABPROT  --  20.6* 21.1*  ?INR  --  1.8* 1.8*  ?CREATININE 1.89*  --  1.82*  ? ? ? ?Estimated Creatinine Clearance: 35.7 mL/min (A) (by C-G formula based on SCr of 1.82 mg/dL (H)). ? ? ?Medical History: ?Past Medical History:  ?Diagnosis Date  ? Acute GI bleeding 06/27/2021  ? Anemia 01/29/2016  ? Atrial fibrillation ([place])   ? post op.  s/p dc-cv 04/2008. previously on amiodarone  ? Atrial flutter ([place])   ? atypical  ? AV block, 1st degree   ? .450 msec--progressive  ? 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  a- Echo 07/2008 Ef 55% mild peroprosthetic MVR with High transmitral gradient (mean 14).  b- normal coronaries by cath 12/2007  ? BPH (benign prostatic hyperplasia)   ? Cardiac resynchronization therapy pacemaker (CRT-P) in place 06/22/2011  ? CHF (congestive heart failure) ([place])   ? EF 35-40 % 2011 due to valvular disease and diastolic dysfunction  ? Chronic back pain   ? CKD (chronic kidney disease), stage III ([place])   ? COVID-19 virus infection 07/24/2020  ? Tested positive on July 16, 2020  ? Dysphagia   ? with normal barium swallow 09/2008  ? Endocarditis   ? GERD (gastroesophageal reflux disease)   ? Heavy alcohol use   ? history  ? History of atrial flutter 03/13/2008  ? History of atrial flutter - resolved.   ? History of GI bleed   ? Hypertension   ? IV drug abuse ([place])   ? history of  ? Lower GI bleed 01/2016  ? Pacemaker 2010  ? ? ? ?Assessment: ?52 YOM  presented with fatigue and LE edema being diuresed.  He also has a history of Afib on Coumadin PTA and Pharmacy asked to continue med while hospitalized.  INR sub-therapeutic at 1.8 on admit.  Noted patient has a GIB in Dec 2022. ? ?Patient has an old instruction sheet for Coumadin dosing from February 6th's appointment.  Since then, he was at the [facility] clinic on 08/18/21 and his Coumadin dose was adjusted for INR of 3.8 to 2.5mg  PO daily except 5mg  on Thurs, Sat and Sun. ? ?INR 1.8 on admission and today  ?Will give boost warfarin 5mg  x1 and adjust as needed.  No bleeding noted, HGB low stable 9 pltc stable 300s  ? ? ?Goal of Therapy:  ?INR 2-3 ?Monitor platelets by anticoagulation protocol: Yes ?  ?Plan:  ?Warfarin 5mg  x1 today ?Replace Low K x1  ?Daily CBC, Protime Bmet - ordered  ? ? ?Bonnita Nasuti Pharm.D. CPP, BCPS ?Clinical Pharmacist ?[phone] ?09/10/2021 12:33 PM  ? ? ? ? ?

## 2021-09-10 NOTE — Progress Notes (Signed)
?  Date: 09/10/2021 ? ?Patient name: [name]  ?Medical record number: [id number]  ?Date of birth: [birth year]  ? ? ?I saw and examined the patient. I reviewed the resident?s note and I agree with the resident?s findings and plan as documented in the resident?s note. ? ?In brief, patient is a 76 year old male with a past medical history of a flutter/A-fib, first-degree AV block, BPH, chronic systolic heart failure, CKD stage IIIa, hypertension, status post ICD placement, status post mechanical mitral valve who presented to the internal medicine clinic yesterday with worsening lower extremity edema. ? ?Patient has noted progressive lower extremity swelling and weakness over the last week.  Patient was seen in clinic in February for increased lower extremity swelling and shortness of breath and was instructed to go up on his torsemide to 20 mg twice daily from 20 mg once daily.  However, patient continues to take his torsemide only daily.  After hospital discharge in December patient weight 174 pounds and was up approximately 20 pounds since then when he was evaluated in the internal medicine clinic.  There was concern at the internal medicine clinic that he was not taking his medications as prescribed due to confusion over how to take them.  Patient was admitted from the internal medicine clinic for IV diuresis.  He denied any increased abdominal tightness.  No chest pain, no palpitations, no lightheadedness, syncope, no fevers or chills, no nausea or vomiting, no diarrhea, no abdominal pain. ? ?Today, patient states that he feels well but still has some soreness in his legs.  He does state that the swelling has decreased. ? ?On exam, patient appears comfortable and in no acute distress.  Heart rate was regular and systolic click was noted on cardiovascular exam.  Lungs with mild bibasilar crackles on exam.  Patient with 1+ bilateral lower extremity pitting edema on exam with no tenderness to palpation. ? ?Patient  was initially admitted to the hospital with worsening shortness of breath and lower extremity edema.  Chest x-ray showed pulmonary vascular congestion and mild edema consistent with an acute on chronic systolic heart failure exacerbation.  Patient started on IV Lasix 40 mg daily and was net -1.8 L over the last 24 hours and is down approximately 5 kg.  We will continue to monitor strict I's and O's and daily weights.  Will increase Lasix to 40 mg IV twice daily today and monitor closely.  We will continue to monitor BMP while diuresing but creatinine appears to be at baseline currently.  Would consider resuming home lisinopril tomorrow if blood pressure remains stable.  No further work-up at this time.  I suspect patient should be stable for DC home tomorrow if he continues to improve. ? ? ? ?Aldine Contes, MD ?09/10/2021, 2:09 PM ? ?

## 2021-09-10 NOTE — Progress Notes (Signed)
Echocardiogram ?2D Echocardiogram has been performed. ? ?[name] ?09/10/2021, 8:50 AM ?

## 2021-09-10 NOTE — Evaluation (Signed)
Physical Therapy Evaluation and Discharge ?Patient Details ?Name: [name] ?MRN: [id number] ?DOB: September 18, 1945 ?Today's Date: 09/10/2021 ? ?History of Present Illness ? Pt is a 76 y.o. M who presents with acute on chronic CHF exacerbation. Significant PMH: atrial flutter/fibrillation, 1st degree AV block, CHF, CKD IIIa, HTN, ICD placement, mechanical mitral valve.  ?Clinical Impression ? PTA, pt lives with his family, is ambulatory using a cane, and is modI for ADL's. Pt presents likely close to his functional baseline. Ambulating 400 feet with a cane modI; SpO2 97% on RA, BP 117/75 (89), HR 82-102. Pt denies fatigue, lightheadedness/dizziness. Pt reports one syncopal episode 2 weeks ago resulting in a fall from a stool. Of note, pt family reporting to RN that pt has had increasing memory difficulties; therefore, recommend assist with IADL's. No further acute or follow up PT needs recommended. Thank you for this consult.  ?   ? ?Recommendations for follow up therapy are one component of a multi-disciplinary discharge planning process, led by the attending physician.  Recommendations may be updated based on patient status, additional functional criteria and insurance authorization. ? ?Follow Up Recommendations No PT follow up ? ?  ?Assistance Recommended at Discharge PRN  ?Patient can return home with the following ? Assist for transportation;Assistance with cooking/housework ? ?  ?Equipment Recommendations None recommended by PT  ?Recommendations for Other Services ?    ?  ?Functional Status Assessment Patient has had a recent decline in their functional status and demonstrates the ability to make significant improvements in function in a reasonable and predictable amount of time.  ? ?  ?Precautions / Restrictions Precautions ?Precautions: Fall ?Restrictions ?Weight Bearing Restrictions: No  ? ?  ? ?Mobility ? Bed Mobility ?  ?  ?  ?  ?  ?  ?  ?General bed mobility comments: OOB in chair ?  ? ?Transfers ?Overall  transfer level: Independent ?Equipment used: None ?  ?  ?  ?  ?  ?  ?  ?  ?  ? ?Ambulation/Gait ?Ambulation/Gait assistance: Modified independent (Device/Increase time) ?Gait Distance (Feet): 400 Feet ?Assistive device: Straight cane ?Gait Pattern/deviations: Step-through pattern, Decreased stride length ?  ?  ?  ?General Gait Details: Steady pace, no gross imbalance noted ? ?Stairs ?  ?  ?  ?  ?  ? ?Wheelchair Mobility ?  ? ?Modified Rankin (Stroke Patients Only) ?  ? ?  ? ?Balance Overall balance assessment: Mild deficits observed, not formally tested ?  ?  ?  ?  ?  ?  ?  ?  ?  ?  ?  ?  ?  ?  ?  ?  ?  ?  ?   ? ? ? ?Pertinent Vitals/Pain Pain Assessment ?Pain Assessment: No/denies pain  ? ? ?Home Living Family/patient expects to be discharged to:: Private residence ?Living Arrangements: Children;Other relatives (grandchildren) ?Available Help at Discharge: Family;Available 24 hours/day ?Type of Home: House ?Home Access: Stairs to enter ?Entrance Stairs-Rails: Right ?Entrance Stairs-Number of Steps: 5 ?  ?Home Layout: One level ?Home Equipment: Kasandra Knudsen - single point ?   ?  ?Prior Function Prior Level of Function : Independent/Modified Independent;Driving ?  ?  ?  ?  ?  ?  ?Mobility Comments: using cane ?  ?  ? ? ?Hand Dominance  ?   ? ?  ?Extremity/Trunk Assessment  ? Upper Extremity Assessment ?Upper Extremity Assessment: Overall WFL for tasks assessed ?  ? ?Lower Extremity Assessment ?Lower Extremity Assessment: Overall WFL for  tasks assessed ?  ? ?Cervical / Trunk Assessment ?Cervical / Trunk Assessment: Normal  ?Communication  ? Communication: No difficulties  ?Cognition Arousal/Alertness: Awake/alert ?Behavior During Therapy: [facility] for tasks assessed/performed ?Overall Cognitive Status: Impaired/Different from baseline ?Area of Impairment: Memory ?  ?  ?  ?  ?  ?  ?  ?  ?  ?  ?Memory: Decreased short-term memory ?  ?  ?  ?  ?General Comments: Pt family telling RN that pt has had STM deficits recently ?  ?  ? ?   ?General Comments   ? ?  ?Exercises    ? ?Assessment/Plan  ?  ?PT Assessment Patient does not need any further PT services  ?PT Problem List   ? ?   ?  ?PT Treatment Interventions     ? ?PT Goals (Current goals can be found in the Care Plan section)  ?Acute Rehab PT Goals ?Patient Stated Goal: walk long distances without getting tired ?PT Goal Formulation: All assessment and education complete, DC therapy ? ?  ?Frequency   ?  ? ? ?Co-evaluation   ?  ?  ?  ?  ? ? ?  ?AM-PAC PT "6 Clicks" Mobility  ?Outcome Measure Help needed turning from your back to your side while in a flat bed without using bedrails?: None ?Help needed moving from lying on your back to sitting on the side of a flat bed without using bedrails?: None ?Help needed moving to and from a bed to a chair (including a wheelchair)?: None ?Help needed standing up from a chair using your arms (e.g., wheelchair or bedside chair)?: None ?Help needed to walk in hospital room?: None ?Help needed climbing 3-5 steps with a railing? : None ?6 Click Score: 24 ? ?  ?End of Session   ?Activity Tolerance: Patient tolerated treatment well ?Patient left: in chair;with call bell/phone within reach ?Nurse Communication: Mobility status ?PT Visit Diagnosis: History of falling (Z91.81) ?  ? ?Time: [id number] ?PT Time Calculation (min) (ACUTE ONLY): 12 min ? ? ?Charges:   PT Evaluation ?$PT Eval Low Complexity: 1 Low ?  ?  ?   ? ? ?Wyona Almas, PT, DPT ?Acute Rehabilitation Services ?Pager [phone] ?Office [phone] ? ? ?Deno Etienne ?09/10/2021, 12:52 PM ? ?

## 2021-09-10 NOTE — Plan of Care (Signed)
  Problem: Education: Goal: Ability to demonstrate management of disease process will improve Outcome: Progressing   Problem: Education: Goal: Ability to verbalize understanding of medication therapies will improve Outcome: Progressing   Problem: Activity: Goal: Capacity to carry out activities will improve Outcome: Progressing   Problem: Cardiac: Goal: Ability to achieve and maintain adequate cardiopulmonary perfusion will improve Outcome: Progressing   

## 2021-09-11 ENCOUNTER — Encounter (HOSPITAL_COMMUNITY): Payer: Self-pay | Admitting: *Deleted

## 2021-09-11 ENCOUNTER — Other Ambulatory Visit (HOSPITAL_COMMUNITY): Payer: Self-pay

## 2021-09-11 LAB — CBC
HCT: 33.4 % — ABNORMAL LOW (ref 39.0–52.0)
Hemoglobin: 9.5 g/dL — ABNORMAL LOW (ref 13.0–17.0)
MCH: 21.7 pg — ABNORMAL LOW (ref 26.0–34.0)
MCHC: 28.4 g/dL — ABNORMAL LOW (ref 30.0–36.0)
MCV: 76.3 fL — ABNORMAL LOW (ref 80.0–100.0)
Platelets: 385 10*3/uL (ref 150–400)
RBC: 4.38 MIL/uL (ref 4.22–5.81)
RDW: 19.4 % — ABNORMAL HIGH (ref 11.5–15.5)
WBC: 6.3 10*3/uL (ref 4.0–10.5)
nRBC: 0.3 % — ABNORMAL HIGH (ref 0.0–0.2)

## 2021-09-11 LAB — BASIC METABOLIC PANEL
Anion gap: 11 (ref 5–15)
BUN: 29 mg/dL — ABNORMAL HIGH (ref 8–23)
CO2: 22 mmol/L (ref 22–32)
Calcium: 9.4 mg/dL (ref 8.9–10.3)
Chloride: 102 mmol/L (ref 98–111)
Creatinine, Ser: 1.91 mg/dL — ABNORMAL HIGH (ref 0.61–1.24)
GFR, Estimated: 36 mL/min — ABNORMAL LOW (ref >60)
Glucose, Bld: 114 mg/dL — ABNORMAL HIGH (ref 70–99)
Potassium: 3.8 mmol/L (ref 3.5–5.1)
Sodium: 135 mmol/L (ref 135–145)

## 2021-09-11 LAB — PROTIME-INR
INR: 1.8 — ABNORMAL HIGH (ref 0.8–1.2)
Prothrombin Time: 21 seconds — ABNORMAL HIGH (ref 11.4–15.2)

## 2021-09-11 LAB — MAGNESIUM: Magnesium: 1.8 mg/dL (ref 1.7–2.4)

## 2021-09-11 MED ORDER — WARFARIN SODIUM 7.5 MG PO TABS
7.5000 mg | ORAL_TABLET | Freq: Once | ORAL | 0 refills | Status: DC
  Filled 2021-09-11: qty 1, 1d supply, fill #0

## 2021-09-11 MED ORDER — WARFARIN SODIUM 5 MG PO TABS
5.0000 mg | ORAL_TABLET | ORAL | 1 refills | Status: DC
  Filled 2021-09-11: qty 30, 30d supply, fill #0

## 2021-09-11 MED ORDER — WARFARIN SODIUM 5 MG PO TABS: 5.0000 mg | ORAL_TABLET | Freq: Once | ORAL | Status: DC

## 2021-09-11 MED ORDER — WARFARIN SODIUM 7.5 MG PO TABS: 7.5000 mg | ORAL_TABLET | Freq: Once | ORAL | Status: DC

## 2021-09-11 MED ORDER — ENOXAPARIN SODIUM 80 MG/0.8ML IJ SOSY
80.0000 mg | PREFILLED_SYRINGE | Freq: Two times a day (BID) | INTRAMUSCULAR | 0 refills | Status: DC
  Filled 2021-09-11: qty 8, 5d supply, fill #0

## 2021-09-11 MED ORDER — ENOXAPARIN SODIUM 80 MG/0.8ML IJ SOSY
80.0000 mg | PREFILLED_SYRINGE | Freq: Two times a day (BID) | INTRAMUSCULAR | Status: DC
  Administered 2021-09-11: 12:00:00 80 mg via SUBCUTANEOUS
  Filled 2021-09-11: qty 0.8

## 2021-09-11 NOTE — Progress Notes (Addendum)
ANTICOAGULATION CONSULT NOTE ? ?Pharmacy Consult:  Coumadin ?Indication: atrial fibrillation ? ?No Known Allergies ? ?Patient Measurements: ?Height: 5\' 8"  (172.7 cm) ?Weight: 82.8 kg (182 lb 8.7 oz) ?IBW/kg (Calculated) : 68.4 ? ?Vital Signs: ?Temp: 97.9 ?F (36.6 ?C) (03/09 0831) ?Temp Source: Oral (03/09 0831) ?BP: 116/76 (03/09 0831) ?Pulse Rate: 88 (03/09 0831) ? ?Labs: ?Recent Labs  ?  09/09/21 ?1132 09/09/21 ?1905 09/10/21 ?[id number] 09/11/21 ?0429  ?HGB 9.3*  --  9.3* 9.5*  ?HCT 33.9*  --  32.7* 33.4*  ?PLT 356  --  397 385  ?LABPROT  --  20.6* 21.1* 21.0*  ?INR  --  1.8* 1.8* 1.8*  ?CREATININE 1.89*  --  1.82* 1.91*  ? ? ? ?Estimated Creatinine Clearance: 34 mL/min (A) (by C-G formula based on SCr of 1.91 mg/dL (H)). ? ? ?Medical History: ?Past Medical History:  ?Diagnosis Date  ? Acute GI bleeding 06/27/2021  ? Anemia 01/29/2016  ? Atrial fibrillation ([place])   ? post op.  s/p dc-cv 04/2008. previously on amiodarone  ? Atrial flutter ([place])   ? atypical  ? AV block, 1st degree   ? .450 msec--progressive  ? 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  a- Echo 07/2008 Ef 55% mild peroprosthetic MVR with High transmitral gradient (mean 14).  b- normal coronaries by cath 12/2007  ? BPH (benign prostatic hyperplasia)   ? Cardiac resynchronization therapy pacemaker (CRT-P) in place 06/22/2011  ? CHF (congestive heart failure) ([place])   ? EF 35-40 % 2011 due to valvular disease and diastolic dysfunction  ? Chronic back pain   ? CKD (chronic kidney disease), stage III ([place])   ? COVID-19 virus infection 07/24/2020  ? Tested positive on July 16, 2020  ? Dysphagia   ? with normal barium swallow 09/2008  ? Endocarditis   ? GERD (gastroesophageal reflux disease)   ? Heavy alcohol use   ? history  ? History of atrial flutter 03/13/2008  ? History of atrial flutter - resolved.   ? History of GI bleed   ? Hypertension   ? IV drug abuse ([place])   ? history of  ? Lower GI bleed 01/2016  ? Pacemaker 2010   ? ? ? ?Assessment: ?30 YOM presented with fatigue and LE edema being diuresed. Pt is on warfarin PTA for hx AFib and mMVR. Pt has hx of GIB Dec 2022. ? ?INR is subtherapeutic on admit and remains low at 1.8. Discharge planned today. Pt has mMVR and thus need parenteral bridging  - will bost tonight. ? ?*Home warfarin dose is 5mg  Thurs/Sat/Sun, 2.5mg  AODs ? ?Goal of Therapy:  ?INR 2-3 ?Monitor platelets by anticoagulation protocol: Yes ?  ?Plan:  ?Warfarin 7.5mg  PO x1 tonight to boost ?Enoxaparin 80mg  SQ BID starting this am ? ? ?Arrie Senate, PharmD, BCPS, BCCP ?Clinical Pharmacist ?[phone] ?Please check AMION for all [facility] numbers ?09/11/2021 ? ? ? ? ? ? ?

## 2021-09-11 NOTE — Progress Notes (Signed)
Mobility Specialist Progress Note: ? ? 09/11/21 1100  ?Mobility  ?Activity Ambulated with assistance in hallway  ?Level of Assistance Modified independent, requires aide device or extra time  ?Assistive Device Shorter  ?Distance Ambulated (ft) 470 ft  ?Activity Response Tolerated well  ?$Mobility charge 1 Mobility  ? ?Pt asx during ambulation. Required no physical assist throughout. Pt left in room with all needs met. ? ? ?Nelta Numbers ?Acute Rehab ?Phone: [phone] ?Office Phone: [phone] ? ?

## 2021-09-11 NOTE — Discharge Summary (Addendum)
Name: [name] MRN: [id number] DOB: Apr 29, 1946 76 y.o. PCP: Dellia Cloud, MD  Date of Admission: 09/09/2021  4:51 PM Date of Discharge:  09/11/2021 Attending Physician: Dr. Heide Spark  DISCHARGE DIAGNOSIS:  Primary Problem: Acute exacerbation of CHF (congestive heart failure) [facility])   Hospital Problems: Principal Problem:   Acute exacerbation of CHF (congestive heart failure) (HCC)    DISCHARGE MEDICATIONS:   Allergies as of 09/11/2021   No Known Allergies      Medication List     TAKE these medications    acetaminophen 500 MG tablet Commonly known as: TYLENOL Take 1,000 mg by mouth every 6 (six) hours as needed (pain).   carvedilol 6.25 MG tablet Commonly known as: COREG Take 1 tablet (6.25 mg total) by mouth 2 (two) times daily with a meal.   diclofenac Sodium 1 % Gel Commonly known as: VOLTAREN APPLY 4 G TOPICALLY 4 TIMES DAILY   enoxaparin 80 MG/0.8ML injection Commonly known as: LOVENOX Inject 0.8 mLs (80 mg total) into the skin every 12 (twelve) hours.   lisinopril 2.5 MG tablet Commonly known as: ZESTRIL Take 1 tablet (2.5 mg total) by mouth daily. NEEDS APPOINTMENT FOR ANY MORE REFILLS What changed: additional instructions   pantoprazole 40 MG tablet Commonly known as: PROTONIX TAKE 1 TABLET (40 MG TOTAL) BY MOUTH TWICE A DAY BEFORE MEALS What changed:  how much to take how to take this when to take this additional instructions   tamsulosin 0.4 MG Caps capsule Commonly known as: FLOMAX Take 2 capsules (0.8 mg total) by mouth daily after breakfast.   torsemide 20 MG tablet Commonly known as: DEMADEX Take 1 tablet (20 mg total) by mouth 2 (two) times daily.   warfarin 7.5 MG tablet Commonly known as: COUMADIN Take as directed. If you are unsure how to take this medication, talk to your nurse or doctor. Original instructions: Take 1 tablet (7.5 mg total) by mouth one time only at 4 PM. What changed: You were already taking a medication  with the same name, and this prescription was added. Make sure you understand how and when to take each.   warfarin 5 MG tablet Commonly known as: COUMADIN Take as directed. If you are unsure how to take this medication, talk to your nurse or doctor. Original instructions: Take 1 tablet (5 mg total) by mouth See admin instructions. Take 2.5 mg tablet by mouth on Tuesdays, Wednesdays, and Fridays. Then take whole tablet rest of days What changed:  how much to take how to take this when to take this additional instructions        DISPOSITION AND FOLLOW-UP:  [name] was discharged from [facility] in Stable condition. At the hospital follow up visit please address:  Acute CHF exacerbation Repeat BMP to assess renal function after diuresis. Monitor for signs of recurrent overload. Once daily dosing of torsemide was not changed at discharge.  History of atrial fibrillation History of atrial flutter History of 1st degree AV block Hx mitral valve replacement with mechanical valve Recheck PT-INR.   Hx CKD 3a It does not appear that he is followed by nephrology, but with apparent CKD 3b, a referral would be appropriate.   Hx BPH with nocturia Consider urology referral.  Follow-up Recommendations: Consults: Nephrology, urology Labs: Basic Metabolic Profile and INR Studies: None Medications:   New changes: One-time boost dose of Warfarin 7.5 mg 03/09 night. Then, return to previous dosing. One injection of Lovenox, twice daily.  Follow-up Appointments:  Follow-up Information     Dellia Cloud, MD Follow up.   Specialty: Internal Medicine Why: Our clinic will call you to schedule this appointment. Contact information: [address] [place] [address] [phone]         Duke Salvia, MD .   Specialty: Cardiology Contact information: [phone] [address] [place] [address] [phone]                  HOSPITAL COURSE:  Patient Summary: Acute CHF exacerbation Patient presented with LE edema, weakness, and positional dyspnea. He did not have new oxygen requirements at time of admission. Labs were remarkable for elevated BUN/Cr to 30/1.89 with GFR of 36 though this is roughly his baseline. BNP mildly elevated from most recent value in 02/17 to 174.9. He was admitted and started on IV Lasix 40 mg daily. Chest x-ray showed cardiomegaly with central pulmonary vascular congestion, new bibasilar infiltrates likely representing mild edema. Echocardiogram obtained and was largely unchanged from 07/2019, showing LV EV 35-40% with moderately decreased function and regional wall motion abnormalities and mildly dilated internal cavity with moderate concentric hypertrophy; severe dyskinesis of LV inferior wall; RV systolic function moderately reduced with normal size; mildly elevated pulmonary artery systolic pressure; severely dilated LA; moderately dilated RA; no MV regurgitation; mild calcification of the AV with mild regurgitation and sclerosis without stenosis. Lasix was increased to BID dosing with appropriate diuresis and decrease in weight and improvement of physical exam findings.   HTN Normotensive throughout admission. Home lisinopril was held on admission.   History of atrial fibrillation History of atrial flutter History of 1st degree AV block Hx mitral valve replacement with mechanical valve INR was subtherapeutic at 1.8, with goal of 2-3. Pharmacy assisted in dosing. Coreg was continued for atrial fibrillation with rate control.   Hx CKD 3a BUN/Cr and GFR relatively unchanged from baseline throughout admission.   Hx BPH with nocturia PSA normal. He was continued on home Flomax.   DISCHARGE INSTRUCTIONS:   Discharge Instructions     (HEART FAILURE PATIENTS) Call MD:  Anytime you have any of the following symptoms: 1) 3 pound weight gain in 24 hours or 5 pounds in 1 week 2)  shortness of breath, with or without a dry hacking cough 3) swelling in the hands, feet or stomach 4) if you have to sleep on extra pillows at night in order to breathe.   Complete by: As directed    Diet - low sodium heart healthy   Complete by: As directed    Discharge instructions   Complete by: As directed    Mr. Kerwick,  It was a pleasure to care for you during your stay at [facility]. I am glad that you are feeling stronger and that we were able to improve the swelling that you were experiencing.  As we discussed, I recommend that you limit your salt intake and be careful to not drink too many fluids.  When you go home, you will have one extra dose of Warfarin tonight, 7.5 mg. Then you will take this medication as normal. You will also have one injection of heparin twice daily. You will follow-up with Dr. [name] on Monday at 8:45 AM.  My best, Dr. August Saucer   Increase activity slowly   Complete by: As directed        SUBJECTIVE:  Patient states that he is feeling okay today. He denies exertional dyspnea or trouble  overnight while trying to sleep. He also feels like his weakness has improved as well as his leg swelling.   Discharge Vitals:   BP 116/76 (BP Location: Right Arm)    Pulse 88    Temp 97.9 F (36.6 C) (Oral)    Resp 18    Ht 5\' 8"  (1.727 m)    Wt 82.8 kg    SpO2 100%    BMI 27.76 kg/m   OBJECTIVE:  Constitutional:Elderly gentleman sitting in bedside recliner without distress. Cardio:Irregular rhythm with regular rate. Systolic click noted.  Pulm:Minimal bibasilar crackles. Normal work of breathing on room air. Abdomen:Soft, nontender, nondistended. [id number] 0-1+ pitting edema to knees.  Skin:Warm and dry with some scaling over bilateral LE. Skin over feet is cool compared to distal LE. Neuro:Alert and oriented x3. No focal deficit noted. Psych: Normal mood and affect.  Pertinent Labs, Studies, and Procedures:  CBC Latest Ref Rng & Units 09/11/2021  09/10/2021 09/09/2021  WBC 4.0 - 10.5 K/uL 6.3 7.6 6.7  Hemoglobin 13.0 - 17.0 g/dL 9.5(L) 9.3(L) 9.3(L)  Hematocrit 39.0 - 52.0 % 33.4(L) 32.7(L) 33.9(L)  Platelets 150 - 400 K/uL 385 397 356    CMP Latest Ref Rng & Units 09/11/2021 09/10/2021 09/09/2021  Glucose 70 - 99 mg/dL 114(H) 89 107(H)  BUN 8 - 23 mg/dL 29(H) 27(H) 30(H)  Creatinine 0.61 - 1.24 mg/dL 1.91(H) 1.82(H) 1.89(H)  Sodium 135 - 145 mmol/L 135 139 139  Potassium 3.5 - 5.1 mmol/L 3.8 3.4(L) 3.3(L)  Chloride 98 - 111 mmol/L 102 105 104  CO2 22 - 32 mmol/L 22 24 25   Calcium 8.9 - 10.3 mg/dL 9.4 9.1 8.9  Total Protein 6.5 - 8.1 g/dL - 6.8 -  Total Bilirubin 0.3 - 1.2 mg/dL - 0.9 -  Alkaline Phos 38 - 126 U/L - 53 -  AST 15 - 41 U/L - 29 -  ALT 0 - 44 U/L - 14 -    DG Chest 2 View  Result Date: 09/09/2021 CLINICAL DATA:  Acute exacerbation of CHF. EXAM: CHEST - 2 VIEW COMPARISON:  Chest x-ray 08/25/2021 FINDINGS: Heart is enlarged, unchanged. ICD and heart valve are again noted. Aorta is tortuous. There is central pulmonary vascular congestion. There is some patchy airspace opacities in both lung bases. Costophrenic angles are clear. No pneumothorax or acute fracture. IMPRESSION: 1. Cardiomegaly with central pulmonary vascular congestion. 2. New bibasilar infiltrates likely represent mild edema. Electronically Signed   By: Ronney Asters M.D.   On: 09/09/2021 19:36   ECHOCARDIOGRAM COMPLETE  Result Date: 09/10/2021    ECHOCARDIOGRAM REPORT   Patient Name:   HARDIN DAR Date of Exam: 09/10/2021 Medical Rec #:  [id number]       Height:       68.0 in Accession #:    [id number]      Weight:       183.2 lb Date of Birth:  1945-07-31       BSA:          1.969 m Patient Age:    37 years        BP:           102/71 mmHg Patient Gender: M               HR:           88 bpm. Exam Location:  Inpatient Procedure: 2D Echo Indications:    CHF  History:        Patient has prior  history of Echocardiogram examinations, most                 recent  08/01/2019. CHF; Risk Factors:Hypertension.                  Mitral Valve: mechanical valve valve is present in the mitral                 position.  Sonographer:    Jefferey Pica Referring Phys: [place]  1. Left ventricular ejection fraction, by estimation, is 35 to 40%. The left ventricle has moderately decreased function. The left ventricle demonstrates regional wall motion abnormalities (see scoring diagram/findings for description). The left ventricular internal cavity size was mildly dilated. There is moderate concentric left ventricular hypertrophy. Left ventricular diastolic function could not be evaluated. There is severe dyskinesis of the left ventricular, entire inferior wall.  2. Right ventricular systolic function is moderately reduced. The right ventricular size is normal. There is mildly elevated pulmonary artery systolic pressure.  3. Left atrial size was severely dilated.  4. Right atrial size was moderately dilated.  5. The mitral valve has been repaired/replaced. No evidence of mitral valve regurgitation. The mean mitral valve gradient is 5.8 mmHg. There is a mechanical valve present in the mitral position. Echo findings are consistent with normal structure and function of the mitral valve prosthesis.  6. The aortic valve is grossly normal. There is mild calcification of the aortic valve. Aortic valve regurgitation is mild. Aortic valve sclerosis is present, with no evidence of aortic valve stenosis.  7. The inferior vena cava is dilated in size with <50% respiratory variability, suggesting right atrial pressure of 15 mmHg. Comparison(s): No significant change from prior study. FINDINGS  Left Ventricle: Left ventricular ejection fraction, by estimation, is 35 to 40%. The left ventricle has moderately decreased function. The left ventricle demonstrates regional wall motion abnormalities. Severe dyskinesis of the left ventricular, entire inferior wall. The left ventricular  internal cavity size was mildly dilated. There is moderate concentric left ventricular hypertrophy. Left ventricular diastolic function could not be evaluated due to atrial fibrillation. Left ventricular diastolic function could not be evaluated. Right Ventricle: The right ventricular size is normal. Right vetricular wall thickness was not well visualized. Right ventricular systolic function is moderately reduced. There is mildly elevated pulmonary artery systolic pressure. The tricuspid regurgitant velocity is 2.52 m/s, and with an assumed right atrial pressure of 15 mmHg, the estimated right ventricular systolic pressure is 0000000 mmHg. Left Atrium: Left atrial size was severely dilated. Right Atrium: Right atrial size was moderately dilated. Pericardium: There is no evidence of pericardial effusion. Mitral Valve: The mitral valve has been repaired/replaced. No evidence of mitral valve regurgitation. There is a mechanical valve present in the mitral position. Echo findings are consistent with normal structure and function of the mitral valve prosthesis. MV peak gradient, 15.4 mmHg. The mean mitral valve gradient is 5.8 mmHg. Tricuspid Valve: The tricuspid valve is normal in structure. Tricuspid valve regurgitation is mild . No evidence of tricuspid stenosis. Aortic Valve: The aortic valve is grossly normal. There is mild calcification of the aortic valve. Aortic valve regurgitation is mild. Aortic regurgitation PHT measures 527 msec. Aortic valve sclerosis is present, with no evidence of aortic valve stenosis. Aortic valve peak gradient measures 3.9 mmHg. Pulmonic Valve: The pulmonic valve was grossly normal. Pulmonic valve regurgitation is not visualized. No evidence of pulmonic stenosis. Aorta: The aortic root, ascending aorta and aortic arch are  all structurally normal, with no evidence of dilitation or obstruction. Venous: The inferior vena cava is dilated in size with less than 50% respiratory variability,  suggesting right atrial pressure of 15 mmHg. IAS/Shunts: The atrial septum is grossly normal. Additional Comments: A device lead is visualized in the right atrium and right ventricle.  LEFT VENTRICLE PLAX 2D LVIDd:         5.06 cm LVIDs:         3.92 cm LV PW:         1.35 cm LV IVS:        1.36 cm LVOT diam:     2.00 cm LV SV:         46 LV SV Index:   24 LVOT Area:     3.14 cm  RIGHT VENTRICLE RV Basal diam:  3.14 cm RV S prime:     8.34 cm/s TAPSE (M-mode): 0.9 cm LEFT ATRIUM              Index        RIGHT ATRIUM           Index LA diam:        5.30 cm  2.69 cm/m   RA Area:     24.40 cm LA Vol (A2C):   175.0 ml 88.88 ml/m  RA Volume:   77.90 ml  39.56 ml/m LA Vol (A4C):   146.0 ml 74.15 ml/m LA Biplane Vol: 162.0 ml 82.28 ml/m  AORTIC VALVE                 PULMONIC VALVE AV Area (Vmax): 2.80 cm     PV Vmax:       0.60 m/s AV Vmax:        98.72 cm/s   PV Peak grad:  1.4 mmHg AV Peak Grad:   3.9 mmHg LVOT Vmax:      87.97 cm/s LVOT Vmean:     50.100 cm/s LVOT VTI:       0.148 m AI PHT:         527 msec  AORTA Ao Root diam: 4.00 cm Ao Asc diam:  3.30 cm MITRAL VALVE                TRICUSPID VALVE MV Area (PHT): 2.69 cm     TR Peak grad:   25.4 mmHg MV Area VTI:   1.19 cm     TR Vmax:        252.00 cm/s MV Peak grad:  15.4 mmHg MV Mean grad:  5.8 mmHg     SHUNTS MV Vmax:       1.96 m/s     Systemic VTI:  0.15 m MV Vmean:      106.0 cm/s   Systemic Diam: 2.00 cm MV Decel Time: 282 msec MV E velocity: 155.00 cm/s Buford Dresser MD Electronically signed by Buford Dresser MD Signature Date/Time: 09/10/2021/10:55:14 AM    Final      Signed: Farrel Gordon, D.O.  Internal Medicine Resident, PGY-1 [facility]  Pager: [phone] 10:47 AM, 09/11/2021

## 2021-09-11 NOTE — Discharge Instructions (Signed)
Information on my medicine - LOVENOX? (enoxaparin) ? ?This medication education was reviewed with me or my healthcare representative as part of my discharge preparation.  The pharmacist that spoke with me during my hospital stay was: Einar Grad, [facility] ? ? ?Why was lovenox? prescribed for you? ?Lovenox? was prescribed to treat blood clots that may have been found in the veins of your legs (deep vein thrombosis) or in your lungs (pulmonary embolism) and to reduce the risk of them occurring again. ? ?What do You need to know about Lovenox?? ?Lovenox?  is given by a shot under the skin by you or a care provider. Lovenox? is injected into the right or left side of your abdomen at least 2 inches away from your belly button. Clean the injection site with an alcohol swab and let dry. Give only the amount prescribed to you by your healthcare provider and do not change the dose unless instructed by your health care provider. ? ?Try to administer the dose(s) about the same time every day. To minimize bruising and irritation, rotate where the shots are given. ? ?Store Lovenox? at room temperature, away from heat and direct light. Do not refrigerate or freeze the syringes. Keep away from children or pets. ? ?Take Lovenox? exactly as prescribed and DO NOT stop taking Lovenox? without talking to the doctor who prescribed the medication.  Stopping may increase your risk of developing a new blood clot.   ? ?After discharge, you should have regular appointments with your healthcare provider. ? ?Dispose of used syringe and cap in a sharps disposal container once used (if sharps disposal container unavailable, use any hard plastic jug that can be securely closed).  ?   ?What do you do if you miss a dose? ?If a dose of Lovenox? is not administered at the scheduled time, administer it as soon as possible on the same day and then resume regular administration schedule. The dose should not be doubled to make up for a missed  dose. ? ?Important Safety Information ?A possible side effect of Lovenox? is bleeding. You should call your healthcare provider right away if you experience any of the following: ?Bleeding from an injury or your nose that does not stop. ?Unusual colored urine (red or dark brown) or unusual colored stools (red or black). ?Unusual bruising for unknown reasons. ?A serious fall or if you hit your head (even if there is no bleeding). ? ?Some medicines may interact with Lovenox? and might increase your risk of bleeding or clotting while on Lovenox?Marland Kitchen To help avoid this, consult your healthcare provider or pharmacist prior to using any new prescription or non-prescription medications, including herbals, vitamins, non-steroidal anti-inflammatory drugs (NSAIDs) and supplements. ? ?This website has more information on Lovenox? (enoxaparin): www.https://www.rowland.com/. ? ?Information on my medicine - Coumadin?   (Warfarin) ? ?This medication education was reviewed with me or my healthcare representative as part of my discharge preparation.  The pharmacist that spoke with me during my hospital stay was:  Einar Grad, [facility] ? ?Why was Coumadin prescribed for you? ?Coumadin was prescribed for you because you have a blood clot or a medical condition that can cause an increased risk of forming blood clots. Blood clots can cause serious health problems by blocking the flow of blood to the heart, lung, or brain. Coumadin can prevent harmful blood clots from forming. ?As a reminder your indication for Coumadin is:  Blood Clot Prevention after Heart Valve Surgery ? ?What test will check on  my response to Coumadin? ?While on Coumadin (warfarin) you will need to have an INR test regularly to ensure that your dose is keeping you in the desired range. The INR (international normalized ratio) number is calculated from the result of the laboratory test called prothrombin time (PT). ? ?If an INR APPOINTMENT HAS NOT ALREADY BEEN MADE FOR YOU please  schedule an appointment to have this lab work done by your health care provider within 7 days. ?Your INR goal is usually a number between:  2 to 3 or your provider may give you a more narrow range like 2-2.5.  Ask your health care provider during an office visit what your goal INR is. ? ?What  do you need to  know  About  COUMADIN? ?Take Coumadin (warfarin) exactly as prescribed by your healthcare provider about the same time each day.  DO NOT stop taking without talking to the doctor who prescribed the medication.  Stopping without other blood clot prevention medication to take the place of Coumadin may increase your risk of developing a new clot or stroke.  Get refills before you run out. ? ?What do you do if you miss a dose? ?If you miss a dose, take it as soon as you remember on the same day then continue your regularly scheduled regimen the next day.  Do not take two doses of Coumadin at the same time. ? ?Important Safety Information ?A possible side effect of Coumadin (Warfarin) is an increased risk of bleeding. You should call your healthcare provider right away if you experience any of the following: ?Bleeding from an injury or your nose that does not stop. ?Unusual colored urine (red or dark brown) or unusual colored stools (red or black). ?Unusual bruising for unknown reasons. ?A serious fall or if you hit your head (even if there is no bleeding). ? ?Some foods or medicines interact with Coumadin? (warfarin) and might alter your response to warfarin. To help avoid this: ?Eat a balanced diet, maintaining a consistent amount of Vitamin K. ?Notify your provider about major diet changes you plan to make. ?Avoid alcohol or limit your intake to 1 drink for women and 2 drinks for men per day. ?(1 drink is 5 oz. wine, 12 oz. beer, or 1.5 oz. liquor.) ? ?Make sure that ANY health care provider who prescribes medication for you knows that you are taking Coumadin (warfarin).  Also make sure the healthcare provider  who is monitoring your Coumadin knows when you have started a new medication including herbals and non-prescription products. ? ?Coumadin? (Warfarin)  Major Drug Interactions  ?Increased Warfarin Effect Decreased Warfarin Effect  ?Alcohol (large quantities) ?Antibiotics (esp. Septra/Bactrim, Flagyl, Cipro) ?Amiodarone (Cordarone) ?Aspirin (ASA) ?Cimetidine (Tagamet) ?Megestrol (Megace) ?NSAIDs (ibuprofen, naproxen, etc.) ?Piroxicam (Feldene) ?Propafenone (Rythmol SR) ?Propranolol (Inderal) ?Isoniazid (INH) ?Posaconazole (Noxafil) Barbiturates (Phenobarbital) ?Carbamazepine (Tegretol) ?Chlordiazepoxide (Librium) ?Cholestyramine (Questran) ?Griseofulvin ?Oral Contraceptives ?Rifampin ?Sucralfate (Carafate) ?Vitamin K  ? ?Coumadin? (Warfarin) Major Herbal Interactions  ?Increased Warfarin Effect Decreased Warfarin Effect  ?Garlic ?Ginseng ?Ginkgo biloba Coenzyme Q10 ?Green tea ?St. John?s wort   ? ?Coumadin? (Warfarin) FOOD Interactions  ?Eat a consistent number of servings per week of foods HIGH in Vitamin K ?(1 serving = ? cup)  ?Collards (cooked, or boiled & drained) ?Kale (cooked, or boiled & drained) ?Mustard greens (cooked, or boiled & drained) ?Parsley *serving size only = ? cup ?Spinach (cooked, or boiled & drained) ?Swiss chard (cooked, or boiled & drained) ?Turnip greens (cooked, or boiled & drained)  ?Eat  a consistent number of servings per week of foods MEDIUM-HIGH in Vitamin K ?(1 serving = 1 cup)  ?Asparagus (cooked, or boiled & drained) ?Broccoli (cooked, boiled & drained, or raw & chopped) ?Brussel sprouts (cooked, or boiled & drained) *serving size only = ? cup ?Lettuce, raw (green leaf, endive, romaine) ?Spinach, raw ?Turnip greens, raw & chopped  ? ?These websites have more information on Coumadin (warfarin):  FailFactory[employer]se; ?www.https://www.atkinson.net/; ? ? ?

## 2021-09-13 NOTE — Progress Notes (Signed)
Internal Medicine Clinic Attending  Case discussed with Dr. Masters  At the time of the visit.  We reviewed the resident's history and exam and pertinent patient test results.  I agree with the assessment, diagnosis, and plan of care documented in the resident's note.  

## 2021-09-14 ENCOUNTER — Other Ambulatory Visit: Payer: Self-pay | Admitting: Internal Medicine

## 2021-09-15 ENCOUNTER — Other Ambulatory Visit: Payer: Self-pay

## 2021-09-15 ENCOUNTER — Emergency Department (HOSPITAL_COMMUNITY): Payer: 59

## 2021-09-15 ENCOUNTER — Observation Stay (HOSPITAL_COMMUNITY): Payer: 59

## 2021-09-15 ENCOUNTER — Encounter (HOSPITAL_COMMUNITY): Payer: Self-pay | Admitting: Emergency Medicine

## 2021-09-15 ENCOUNTER — Observation Stay (HOSPITAL_COMMUNITY)
Admission: EM | Admit: 2021-09-15 | Discharge: 2021-09-16 | Disposition: A | Discharge status: Home / Self Care | Payer: 59 | Attending: Internal Medicine | Admitting: Internal Medicine

## 2021-09-15 ENCOUNTER — Ambulatory Visit: Payer: 59

## 2021-09-15 ENCOUNTER — Telehealth: Payer: Self-pay

## 2021-09-15 ENCOUNTER — Encounter: Payer: 59 | Admitting: Student

## 2021-09-15 DIAGNOSIS — N1831 Chronic kidney disease, stage 3a: Secondary | ICD-10-CM | POA: Diagnosis not present

## 2021-09-15 DIAGNOSIS — I509 Heart failure, unspecified: Secondary | ICD-10-CM | POA: Diagnosis not present

## 2021-09-15 DIAGNOSIS — R0600 Dyspnea, unspecified: Secondary | ICD-10-CM

## 2021-09-15 DIAGNOSIS — Z7901 Long term (current) use of anticoagulants: Secondary | ICD-10-CM | POA: Diagnosis not present

## 2021-09-15 DIAGNOSIS — I130 Hypertensive heart and chronic kidney disease with heart failure and stage 1 through stage 4 chronic kidney disease, or unspecified chronic kidney disease: Secondary | ICD-10-CM | POA: Diagnosis not present

## 2021-09-15 DIAGNOSIS — I4891 Unspecified atrial fibrillation: Secondary | ICD-10-CM | POA: Insufficient documentation

## 2021-09-15 DIAGNOSIS — Z95810 Presence of automatic (implantable) cardiac defibrillator: Secondary | ICD-10-CM | POA: Insufficient documentation

## 2021-09-15 DIAGNOSIS — R42 Dizziness and giddiness: Secondary | ICD-10-CM

## 2021-09-15 DIAGNOSIS — G459 Transient cerebral ischemic attack, unspecified: Secondary | ICD-10-CM | POA: Diagnosis not present

## 2021-09-15 DIAGNOSIS — Z20822 Contact with and (suspected) exposure to covid-19: Secondary | ICD-10-CM | POA: Insufficient documentation

## 2021-09-15 DIAGNOSIS — Z8616 Personal history of COVID-19: Secondary | ICD-10-CM | POA: Insufficient documentation

## 2021-09-15 DIAGNOSIS — Z79899 Other long term (current) drug therapy: Secondary | ICD-10-CM | POA: Diagnosis not present

## 2021-09-15 DIAGNOSIS — R202 Paresthesia of skin: Secondary | ICD-10-CM | POA: Diagnosis present

## 2021-09-15 LAB — URINALYSIS, ROUTINE W REFLEX MICROSCOPIC
Bilirubin Urine: NEGATIVE
Glucose, UA: NEGATIVE mg/dL
Hgb urine dipstick: NEGATIVE
Ketones, ur: NEGATIVE mg/dL
Leukocytes,Ua: NEGATIVE
Nitrite: NEGATIVE
Protein, ur: NEGATIVE mg/dL
Specific Gravity, Urine: 1.005 (ref 1.005–1.030)
pH: 8 (ref 5.0–8.0)

## 2021-09-15 LAB — RAPID URINE DRUG SCREEN, HOSP PERFORMED
Amphetamines: NOT DETECTED
Barbiturates: NOT DETECTED
Benzodiazepines: NOT DETECTED
Cocaine: NOT DETECTED
Opiates: NOT DETECTED
Tetrahydrocannabinol: NOT DETECTED

## 2021-09-15 LAB — RESP PANEL BY RT-PCR (FLU A&B, COVID) ARPGX2
Influenza A by PCR: NEGATIVE
Influenza B by PCR: NEGATIVE
SARS Coronavirus 2 by RT PCR: NEGATIVE

## 2021-09-15 LAB — DIFFERENTIAL
Abs Immature Granulocytes: 0.03 10*3/uL (ref 0.00–0.07)
Basophils Absolute: 0.1 10*3/uL (ref 0.0–0.1)
Basophils Relative: 1 %
Eosinophils Absolute: 0.2 10*3/uL (ref 0.0–0.5)
Eosinophils Relative: 4 %
Immature Granulocytes: 1 %
Lymphocytes Relative: 28 %
Lymphs Abs: 1.5 10*3/uL (ref 0.7–4.0)
Monocytes Absolute: 0.9 10*3/uL (ref 0.1–1.0)
Monocytes Relative: 17 %
Neutro Abs: 2.7 10*3/uL (ref 1.7–7.7)
Neutrophils Relative %: 49 %

## 2021-09-15 LAB — CBC
HCT: 32.9 % — ABNORMAL LOW (ref 39.0–52.0)
Hemoglobin: 9.3 g/dL — ABNORMAL LOW (ref 13.0–17.0)
MCH: 21.5 pg — ABNORMAL LOW (ref 26.0–34.0)
MCHC: 28.3 g/dL — ABNORMAL LOW (ref 30.0–36.0)
MCV: 76.2 fL — ABNORMAL LOW (ref 80.0–100.0)
Platelets: 425 10*3/uL — ABNORMAL HIGH (ref 150–400)
RBC: 4.32 MIL/uL (ref 4.22–5.81)
RDW: 19 % — ABNORMAL HIGH (ref 11.5–15.5)
WBC: 5.4 10*3/uL (ref 4.0–10.5)
nRBC: 0 % (ref 0.0–0.2)

## 2021-09-15 LAB — PROTIME-INR
INR: 2.9 — ABNORMAL HIGH (ref 0.8–1.2)
Prothrombin Time: 30.2 seconds — ABNORMAL HIGH (ref 11.4–15.2)

## 2021-09-15 LAB — COMPREHENSIVE METABOLIC PANEL
ALT: 17 U/L (ref 0–44)
AST: 30 U/L (ref 15–41)
Albumin: 3.6 g/dL (ref 3.5–5.0)
Alkaline Phosphatase: 52 U/L (ref 38–126)
Anion gap: 11 (ref 5–15)
BUN: 28 mg/dL — ABNORMAL HIGH (ref 8–23)
CO2: 27 mmol/L (ref 22–32)
Calcium: 9.2 mg/dL (ref 8.9–10.3)
Chloride: 98 mmol/L (ref 98–111)
Creatinine, Ser: 2.11 mg/dL — ABNORMAL HIGH (ref 0.61–1.24)
GFR, Estimated: 32 mL/min — ABNORMAL LOW (ref >60)
Glucose, Bld: 102 mg/dL — ABNORMAL HIGH (ref 70–99)
Potassium: 4.1 mmol/L (ref 3.5–5.1)
Sodium: 136 mmol/L (ref 135–145)
Total Bilirubin: 0.7 mg/dL (ref 0.3–1.2)
Total Protein: 6.7 g/dL (ref 6.5–8.1)

## 2021-09-15 LAB — ETHANOL: Alcohol, Ethyl (B): <10 mg/dL (ref <10)

## 2021-09-15 LAB — APTT: aPTT: 57 seconds — ABNORMAL HIGH (ref 24–36)

## 2021-09-15 LAB — BRAIN NATRIURETIC PEPTIDE: B Natriuretic Peptide: 96.7 pg/mL (ref 0.0–100.0)

## 2021-09-15 LAB — TROPONIN I (HIGH SENSITIVITY)
Troponin I (High Sensitivity): 39 ng/L — ABNORMAL HIGH (ref <18)
Troponin I (High Sensitivity): 46 ng/L — ABNORMAL HIGH (ref <18)

## 2021-09-15 MED ORDER — LACTATED RINGERS IV BOLUS
250.0000 mL | Freq: Once | INTRAVENOUS | Status: AC
  Administered 2021-09-15: 250 mL via INTRAVENOUS

## 2021-09-15 MED ORDER — GUAIFENESIN-DM 100-10 MG/5ML PO SYRP
5.0000 mL | ORAL_SOLUTION | ORAL | Status: DC | PRN
  Administered 2021-09-15 – 2021-09-16 (×2): 5 mL via ORAL
  Filled 2021-09-15 (×2): qty 5

## 2021-09-15 MED ORDER — ONDANSETRON HCL 4 MG/2ML IJ SOLN
4.0000 mg | Freq: Four times a day (QID) | INTRAMUSCULAR | Status: DC | PRN
Start: 2021-09-15 — End: 2021-09-17

## 2021-09-15 MED ORDER — TORSEMIDE 20 MG PO TABS
20.0000 mg | ORAL_TABLET | Freq: Two times a day (BID) | ORAL | Status: DC
  Administered 2021-09-16: 20 mg via ORAL
  Filled 2021-09-15: qty 1

## 2021-09-15 MED ORDER — WARFARIN SODIUM 2.5 MG PO TABS
2.5000 mg | ORAL_TABLET | Freq: Once | ORAL | Status: AC
  Administered 2021-09-15: 2.5 mg via ORAL
  Filled 2021-09-15 (×2): qty 1

## 2021-09-15 MED ORDER — ACETAMINOPHEN 325 MG PO TABS
650.0000 mg | ORAL_TABLET | Freq: Four times a day (QID) | ORAL | Status: DC | PRN
  Administered 2021-09-16: 650 mg via ORAL
  Filled 2021-09-15: qty 2

## 2021-09-15 MED ORDER — ONDANSETRON HCL 4 MG PO TABS: 4.0000 mg | ORAL_TABLET | Freq: Four times a day (QID) | ORAL | Status: DC | PRN

## 2021-09-15 MED ORDER — ACETAMINOPHEN 650 MG RE SUPP: 650.0000 mg | Freq: Four times a day (QID) | RECTAL | Status: DC | PRN

## 2021-09-15 MED ORDER — WARFARIN - PHARMACIST DOSING INPATIENT: Freq: Every day | Status: DC

## 2021-09-15 MED ORDER — FUROSEMIDE 10 MG/ML IJ SOLN
40.0000 mg | Freq: Every day | INTRAMUSCULAR | Status: DC
Start: 2021-09-15 — End: 2021-09-15

## 2021-09-15 MED ORDER — TAMSULOSIN HCL 0.4 MG PO CAPS
0.8000 mg | ORAL_CAPSULE | Freq: Every day | ORAL | Status: DC
Start: 2021-09-16 — End: 2021-09-17
  Administered 2021-09-16: 0.8 mg via ORAL
  Filled 2021-09-15: qty 2

## 2021-09-15 MED ORDER — SENNOSIDES-DOCUSATE SODIUM 8.6-50 MG PO TABS: 1.0000 | ORAL_TABLET | Freq: Every evening | ORAL | Status: DC | PRN

## 2021-09-15 NOTE — ED Provider Notes (Signed)
Seen after prior ED provider. ? ?Patient reports feeling improved. ? ?Patient cannot recall the exact events leading to his arrival here in the ED. ? ?Patient's work-up is overall reassuring. ? ?However, patient does not have an MRI compatible ?Pacemaker.  He also has renal dysfunction that would interfere with use of contrasted CT imaging. ? ?Patient is well-known to internal medicine teaching service.  They will evaluate the patient for possible overnight observation. ?  ?Wynetta Fines, MD ?09/15/21 1738 ? ?

## 2021-09-15 NOTE — ED Triage Notes (Addendum)
Patient BIB [facility] from home with complaint of dizziness when standing that started at approximately 1200 today. Dizziness resolves when seated. 18g saline lock in left AC. Patient alert, oriented x3 (disoriented to time), and in no apparent distress at this time. ? ?Patient also complains of headache and left arm numbness. No facial droop, grip strength equal bilaterally, no vision changes, no arm drift, no sensation deficit. ?

## 2021-09-15 NOTE — ED Provider Notes (Signed)
[facility] EMERGENCY DEPARTMENT Provider Note   CSN: [id number] Arrival date & time: 09/15/21  1303     History  Chief Complaint  Patient presents with   Dizziness    [name] is a 76 y.o. male.   Dizziness Patient presents from home via EMS for complaint of dizziness when standing.  This reportedly began today shortly prior to arrival.  In ED triage, he complained of head and left arm numbness.  Upon being bedded in the ED, patient endorses bilateral hand numbness.  He states that he cannot remember why he came to the ED in the first place.  He denies any chest pain or headache.  Patient lives with his daughter and grandchildren.  He did have a recent admission for CHF exacerbation.  He was diuresed over 2 days and was discharged 4 days ago.  He is currently prescribed warfarin and was discharged on a Lovenox bridge.  History per friend, [name]: Patient seem to be in his normal state of health this morning.  At around 11 AM, he complained of left arm numbness.  He also felt dizzy.  [name] and the patient's daughter manages medications.  He has been taking his medications as prescribed.  He was recently told to discontinue Lovenox shots and so he did not get 1 of those today.  [name] states that patient has dementia at baseline.    Home Medications Prior to Admission medications   Medication Sig Start Date End Date Taking? Authorizing Provider  acetaminophen (TYLENOL) 500 MG tablet Take 1,000 mg by mouth every 6 (six) hours as needed (pain).   Yes [provider]  carvedilol (COREG) 6.25 MG tablet Take 1 tablet (6.25 mg total) by mouth 2 (two) times daily with a meal. 12/16/20  Yes Roylene Reason, MD  enoxaparin (LOVENOX) 80 MG/0.8ML injection Inject 1 syringe (80 mg total) into the skin every 12 (twelve) hours. 09/11/21  Yes Champ Mungo, DO  lisinopril (ZESTRIL) 2.5 MG tablet Take 1 tablet (2.5 mg total) by mouth daily. NEEDS APPOINTMENT  FOR ANY MORE REFILLS Patient taking differently: Take 2.5 mg by mouth daily. 08/29/21  Yes Demaio, Alexa, MD  pantoprazole (PROTONIX) 40 MG tablet TAKE 1 TABLET (40 MG TOTAL) BY MOUTH TWICE A DAY BEFORE MEALS Patient taking differently: Take 40 mg by mouth 2 (two) times daily before a meal. 08/29/21  Yes Demaio, Alexa, MD  tamsulosin (FLOMAX) 0.4 MG CAPS capsule Take 2 capsules (0.8 mg total) by mouth daily after breakfast. 08/29/21  Yes Demaio, Alexa, MD  torsemide (DEMADEX) 20 MG tablet Take 1 tablet (20 mg total) by mouth 2 (two) times daily. 09/09/21  Yes Masters, Florentina Addison, DO  warfarin (COUMADIN) 5 MG tablet Take  1/2 tablet (2.5 mg) tablet by mouth on Tuesdays, Wednesdays, and Fridays.Then take whole tablet (5mg ) rest of days Patient taking differently: Take 2.5-5 mg by mouth See admin instructions. Take 1 tablet (5 mg) on (Sun, Thurs, Sat) & Take 1/2 tablet (2.5 mg) all other days 09/11/21  Yes Champ Mungo, DO  diclofenac Sodium (VOLTAREN) 1 % GEL APPLY 4 G TOPICALLY 4 TIMES DAILY Patient not taking: Reported on 06/27/2021 03/12/21   Dellia Cloud, MD  warfarin (COUMADIN) 7.5 MG tablet Take 1 tablet (7.5 mg total) by mouth one time only at 4 PM. Patient not taking: Reported on 09/15/2021 09/11/21   Champ Mungo, DO      Allergies    Patient has no known allergies.  Review of Systems   Review of Systems  Unable to perform ROS: Dementia  Neurological:  Positive for dizziness and numbness.   Physical Exam Updated Vital Signs BP 117/82 (BP Location: Right Arm)   Pulse 79   Temp 98.9 F (37.2 C) (Oral)   Resp 18   Ht 5\' 8"  (1.727 m)   Wt 82.4 kg   SpO2 96%   BMI 27.62 kg/m  Physical Exam Vitals and nursing note reviewed.  Constitutional:      General: He is not in acute distress.    Appearance: Normal appearance. He is well-developed and normal weight. He is not ill-appearing, toxic-appearing or diaphoretic.  HENT:     Head: Normocephalic and atraumatic.     Right Ear: External ear  normal.     Left Ear: External ear normal.     Nose: Nose normal.     Mouth/Throat:     Mouth: Mucous membranes are moist.     Pharynx: Oropharynx is clear.  Eyes:     General: No visual field deficit.    Extraocular Movements: Extraocular movements intact.     Conjunctiva/sclera: Conjunctivae normal.  Cardiovascular:     Rate and Rhythm: Normal rate and regular rhythm.     Heart sounds: No murmur heard. Pulmonary:     Effort: Pulmonary effort is normal. No respiratory distress.     Breath sounds: Normal breath sounds. No wheezing or rales.  Chest:     Chest wall: No tenderness.  Abdominal:     Palpations: Abdomen is soft.     Tenderness: There is no abdominal tenderness.  Musculoskeletal:        General: No swelling or deformity. Normal range of motion.     Cervical back: Normal range of motion and neck supple. No rigidity.  Skin:    General: Skin is warm and dry.     Capillary Refill: Capillary refill takes less than 2 seconds.     Coloration: Skin is not jaundiced or pale.  Neurological:     Mental Status: He is alert. Mental status is at baseline. He is disoriented.     Cranial Nerves: Cranial nerves 2-12 are intact. No cranial nerve deficit, dysarthria or facial asymmetry.     Sensory: Sensory deficit (Endorses diminished sensation on distal bilateral upper extremities) present.     Motor: No weakness, abnormal muscle tone or pronator drift.     Coordination: Coordination is intact. Coordination normal. Finger-Nose-Finger Test normal.  Psychiatric:        Mood and Affect: Mood normal.        Behavior: Behavior normal.    ED Results / Procedures / Treatments   Labs (all labs ordered are listed, but only abnormal results are displayed) Labs Reviewed  PROTIME-INR - Abnormal; Notable for the following components:      Result Value   Prothrombin Time 30.2 (*)    INR 2.9 (*)    All other components within normal limits  APTT - Abnormal; Notable for the following  components:   aPTT 57 (*)    All other components within normal limits  CBC - Abnormal; Notable for the following components:   Hemoglobin 9.3 (*)    HCT 32.9 (*)    MCV 76.2 (*)    MCH 21.5 (*)    MCHC 28.3 (*)    RDW 19.0 (*)    Platelets 425 (*)    All other components within normal limits  COMPREHENSIVE METABOLIC PANEL - Abnormal; Notable  for the following components:   Glucose, Bld 102 (*)    BUN 28 (*)    Creatinine, Ser 2.11 (*)    GFR, Estimated 32 (*)    All other components within normal limits  URINALYSIS, ROUTINE W REFLEX MICROSCOPIC - Abnormal; Notable for the following components:   Color, Urine STRAW (*)    All other components within normal limits  CBC - Abnormal; Notable for the following components:   Hemoglobin 9.7 (*)    HCT 33.4 (*)    MCV 74.1 (*)    MCH 21.5 (*)    MCHC 29.0 (*)    RDW 19.1 (*)    Platelets 425 (*)    All other components within normal limits  BASIC METABOLIC PANEL - Abnormal; Notable for the following components:   BUN 29 (*)    Creatinine, Ser 2.01 (*)    GFR, Estimated 34 (*)    All other components within normal limits  PROTIME-INR - Abnormal; Notable for the following components:   Prothrombin Time 29.5 (*)    INR 2.8 (*)    All other components within normal limits  TROPONIN I (HIGH SENSITIVITY) - Abnormal; Notable for the following components:   Troponin I (High Sensitivity) 39 (*)    All other components within normal limits  TROPONIN I (HIGH SENSITIVITY) - Abnormal; Notable for the following components:   Troponin I (High Sensitivity) 46 (*)    All other components within normal limits  RESP PANEL BY RT-PCR (FLU A&B, COVID) ARPGX2  ETHANOL  DIFFERENTIAL  RAPID URINE DRUG SCREEN, HOSP PERFORMED  BRAIN NATRIURETIC PEPTIDE  MAGNESIUM  PHOSPHORUS  VITAMIN B12  FOLATE    EKG EKG Interpretation  Date/Time:  Monday September 15 2021 13:44:29 EDT Ventricular Rate:  72 PR Interval:    QRS Duration: 100 QT  Interval:  410 QTC Calculation: 449 R Axis:   60 Text Interpretation: Atrial fibrillation Nonspecific T abnormalities, lateral leads Confirmed by Gloris Manchester [phone]) on 09/15/2021 2:25:18 PM  Radiology X-ray chest PA and lateral  Result Date: 09/15/2021 CLINICAL DATA:  Patient here from home with complaint of dizziness when standing that started at approximately 1200 today, also having headache and left arm numbness. EXAM: CHEST - 2 VIEW COMPARISON:  Chest x-ray 09/09/2021. FINDINGS: Left chest wall three lead pacemaker in similar position. Status post mitral valve replacement. The heart and mediastinal contours are unchanged. Persistent prominent hilar vasculature. Aortic calcification. No focal consolidation. Slightly decreased prominent interstitial markings. No pleural effusion. No pneumothorax. No acute osseous abnormality. IMPRESSION: 1. Pulmonary edema slightly improved. 2.  Aortic Atherosclerosis (ICD10-I70.0). Electronically Signed   By: Tish Frederickson M.D.   On: 09/15/2021 20:41   CT Head Wo Contrast  Result Date: 09/15/2021 CLINICAL DATA:  Dizziness with standing Neurological EXAM: CT HEAD WITHOUT CONTRAST TECHNIQUE: Contiguous axial images were obtained from the base of the skull through the vertex without intravenous contrast. RADIATION DOSE REDUCTION: This exam was performed according to the departmental dose-optimization program which includes automated exposure control, adjustment of the mA and/or kV according to patient size and/or use of iterative reconstruction technique. COMPARISON:  None. FINDINGS: Brain: No evidence of acute infarction, hemorrhage, hydrocephalus, extra-axial collection or mass lesion/mass effect. Diffuse cortical atrophy. Vascular: No hyperdense vessel or unexpected calcification. Skull: Normal. Negative for fracture or focal lesion. Sinuses/Orbits: There has been prior resection of the medial walls of the maxillary sinuses. There is partial opacification of the left  maxillary sinus. Deformity of the medial wall  of the orbits is consistent with prior fracture. Other: None. IMPRESSION: No acute intracranial abnormality. Electronically Signed   By: Acquanetta Belling M.D.   On: 09/15/2021 15:23    Procedures Procedures    Medications Ordered in ED Medications  acetaminophen (TYLENOL) tablet 650 mg (has no administration in time range)    Or  acetaminophen (TYLENOL) suppository 650 mg (has no administration in time range)  senna-docusate (Senokot-S) tablet 1 tablet (has no administration in time range)  ondansetron (ZOFRAN) tablet 4 mg (has no administration in time range)    Or  ondansetron (ZOFRAN) injection 4 mg (has no administration in time range)  tamsulosin (FLOMAX) capsule 0.8 mg (has no administration in time range)  Warfarin - Pharmacist Dosing Inpatient (has no administration in time range)  torsemide (DEMADEX) tablet 20 mg (has no administration in time range)  guaiFENesin-dextromethorphan (ROBITUSSIN DM) 100-10 MG/5ML syrup 5 mL (5 mLs Oral Given 09/15/21 2230)  lactated ringers bolus 250 mL (0 mLs Intravenous Stopped 09/15/21 1549)  warfarin (COUMADIN) tablet 2.5 mg (2.5 mg Oral Given 09/15/21 2245)    ED Course/ Medical Decision Making/ A&P                           Medical Decision Making Risk Decision regarding hospitalization.   This patient presents to the ED for concern of dizziness and numbness, this involves an extensive number of treatment options, and is a complaint that carries with it a high risk of complications and morbidity.  The differential diagnosis includes orthostatic hypotension, CHF, CVA, TIA   Co morbidities that complicate the patient evaluation  HTN, CHF, BPH, mitral valve replacement, CKD, chronic back pain, history of polysubstance abuse   Additional history obtained:  Additional history obtained from patient's roommate and caregiver External records from outside source obtained and reviewed including  EMR   Lab Tests:  I Ordered, and personally interpreted labs.  The pertinent results include: No leukocytosis, baseline anemia, mildly elevated troponin, normal electrolytes, baseline CKD   Imaging Studies ordered:  I ordered imaging studies including CT head I independently visualized and interpreted imaging which showed no acute findings I agree with the radiologist interpretation   Cardiac Monitoring:  The patient was maintained on a cardiac monitor.  I personally viewed and interpreted the cardiac monitored which showed an underlying rhythm of: Atrial fibrillation   Medicines ordered and prescription drug management:  I ordered medication including IV fluids for dehydration Reevaluation of the patient after these medicines showed that the patient stayed the same I have reviewed the patients home medicines and have made adjustments as needed    Problem List / ED Course:  76 year old male presenting for initial concerns of dizziness and left arm numbness.  History is provided primarily by [name], who he lives with.  Patient was reportedly in his normal state of health earlier today.  At around 11 AM, he endorsed dizziness and left arm numbness.  She also states that he felt some left arm pain.  In the ED triage, he endorsed chest pain and left hand numbness.  On my initial assessment, he endorses numbness in both hands.  He denies any other complaints at this time.  When patient was stood up, he did feel dizzy.  Orthostatic vital signs were checked and he did have a 15 point drop in his SBP.  Small bolus of IV fluids was ordered.  Gentle hydration to be performed due to his history  of pulmonary edema including a recent admission for CHF exacerbation.  Patient's history is concerning for TIA.  CT of head showed no acute findings.  Plan will be for further work-up.  Care of patient was signed out to oncoming ED provider.    Social Determinants of Health:  Dementia, unable to  live independently, recent hospital admission           Final Clinical Impression(s) / ED Diagnoses Final diagnoses:  Dizziness  Dyspnea    Rx / DC Orders ED Discharge Orders     None         Gloris Manchester, MD 09/16/21 [phone]

## 2021-09-15 NOTE — Progress Notes (Signed)
ANTICOAGULATION CONSULT NOTE ? ?Pharmacy Consult:  Coumadin ?Indication: atrial fibrillation ? ?No Known Allergies ? ?Patient Measurements: ?  ? ?Vital Signs: ?Temp: 98.8 ?F (37.1 ?C) (03/13 1312) ?Temp Source: Oral (03/13 1312) ?BP: 118/57 (03/13 1900) ?Pulse Rate: 84 (03/13 1900) ? ?Labs: ?Recent Labs  ?  09/15/21 ?1335 09/15/21 ?1608  ?HGB 9.3*  --   ?HCT 32.9*  --   ?PLT 425*  --   ?APTT 57*  --   ?LABPROT 30.2*  --   ?INR 2.9*  --   ?CREATININE 2.11*  --   ?TROPONINIHS 39* 46*  ? ? ? ?Estimated Creatinine Clearance: 30.8 mL/min (A) (by C-G formula based on SCr of 2.11 mg/dL (H)). ? ? ?Medical History: ?Past Medical History:  ?Diagnosis Date  ? Acute GI bleeding 06/27/2021  ? Anemia 01/29/2016  ? Atrial fibrillation ([place])   ? post op.  s/p dc-cv 04/2008. previously on amiodarone  ? Atrial flutter ([place])   ? atypical  ? AV block, 1st degree   ? .450 msec--progressive  ? 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  a- Echo 07/2008 Ef 55% mild peroprosthetic MVR with High transmitral gradient (mean 14).  b- normal coronaries by cath 12/2007  ? BPH (benign prostatic hyperplasia)   ? Cardiac resynchronization therapy pacemaker (CRT-P) in place 06/22/2011  ? CHF (congestive heart failure) ([place])   ? EF 35-40 % 2011 due to valvular disease and diastolic dysfunction  ? Chronic back pain   ? CKD (chronic kidney disease), stage III ([place])   ? COVID-19 virus infection 07/24/2020  ? Tested positive on July 16, 2020  ? Dysphagia   ? with normal barium swallow 09/2008  ? Endocarditis   ? GERD (gastroesophageal reflux disease)   ? Heavy alcohol use   ? history  ? History of atrial flutter 03/13/2008  ? History of atrial flutter - resolved.   ? History of GI bleed   ? Hypertension   ? IV drug abuse ([place])   ? history of  ? Lower GI bleed 01/2016  ? Pacemaker 2010  ? ? ? ?Assessment: ?63 YOM presented with dizziness when standing. Pt is on warfarin PTA for hx AFib and mMVR. Patient was being bridged  with Lovenox PTA, but Lovenox was not resumed with therapeutic INR. Of 2.9 on admission. Pt has hx of GIB Dec 2022. Patient reports last warfarin dose was 09/14/21. ? ?Home warfarin dose is 5mg  Thurs/Sat/Sun, 2.5mg  AODs. Last INR in clinic was 3.8 mg. ? ?Hg 9.3, HCT 32.9 ? ?Goal of Therapy:  ?INR 2-3 ?Monitor platelets by anticoagulation protocol: Yes ?  ?Plan:  ?Warfarin 2.5mg  PO x1 now ? ?Thank you for allowing pharmacy to participate in this patient's care. ? ?Reatha Harps, PharmD ?PGY1 Pharmacy Resident ?09/15/2021 7:34 PM ?Check [email or web address] for unit specific pharmacy number ? ? ? ? ? ? ? ?

## 2021-09-15 NOTE — ED Notes (Addendum)
Orthostatic done by EDP ?

## 2021-09-15 NOTE — ED Notes (Signed)
Patient transported to CT 

## 2021-09-15 NOTE — Consult Note (Addendum)
NEUROLOGY CONSULTATION NOTE   Date of service: September 15, 2021 Patient Name: [name] MRN:  [id number] DOB:  1946-01-01 Reason for consult: "concern for TIA" Requesting Provider: Valarie Merino, MD _ _ _   _ __   _ __ _ _  __ __   _ __   __ _  History of Present Illness  [name] is a 76 y.o. male with PMH significant for afibb/flutter on warfarin, hx of 1st degree AV block, hx of mechanical mitral valve, BPH, CKD 3, HTN, s/p ICD placement who presents with dizzziness with standing. Also reported headache to the ED team along with L arm numbness.  CTH w/o contrast with no acute intracranial abnormality. INR here was therapeutic at 2.9.  Patient reports that he went out in the yard and was dragging the chair to a different spot in the yard. The chair got stuck and hit him in the left lower chest. He reports numbness in the lower left chest and upper abdomen immediately following this. This lasted for a couple hours. He went back inside and spoke to his friend [name]. I spoke to [name] to get collateral. She reports that he came inside and was visibly short of breath and reported dizzy and that his L arm was numb. [name] denies any L arm numbness to me. The symptoms per Ms. Bryant lasted abotu 30 mins and then they called ambulance.  [name] also reports that lately [name] has been forgetting, feels he has touch of Alzheimer's and repeats himself.  He does not eat vegetables often. No EtOH. No recreational substances. He snores, has significant difficulty falling asleep. He does not sleep much.  [name] denies any symptoms at this time. He denies any headache. He reports that he does get some intermittent numbness from time to time.   ROS   Constitutional Denies weight loss, fever and chills.   HEENT Denies changes in vision and hearing.   Respiratory Denies SOB and cough.   CV Denies palpitations and CP   GI Denies abdominal pain, nausea, vomiting  and diarrhea.   GU Denies dysuria and urinary frequency.   MSK Denies myalgia and joint pain.   Skin Denies rash and pruritus.   Neurological Denies headache and syncope.   Psychiatric Denies recent changes in mood. Denies anxiety and depression.    Past History   Past Medical History:  Diagnosis Date   Acute GI bleeding 06/27/2021   Anemia 01/29/2016   Atrial fibrillation ([place])    post op.  s/p dc-cv 04/2008. 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  a- Echo 07/2008 Ef 55% mild peroprosthetic MVR with High transmitral gradient (mean 14).  b- normal coronaries by cath 12/2007   BPH (benign prostatic hyperplasia)    Cardiac resynchronization therapy pacemaker (CRT-P) in place 06/22/2011   CHF (congestive heart failure) (HCC)    EF 35-40 % 2011 due to valvular disease and diastolic dysfunction   Chronic back pain    CKD (chronic kidney disease), stage III ([place])    COVID-19 virus infection 07/24/2020   Tested positive on July 16, 2020   Dysphagia    with normal barium swallow 09/2008   Endocarditis    GERD (gastroesophageal reflux disease)    Heavy alcohol  use    history   History of atrial flutter 03/13/2008   History of atrial flutter - resolved.    History of GI bleed    Hypertension    IV drug abuse ([place])    history of   Lower GI bleed 01/2016   Pacemaker 2010   Past Surgical History:  Procedure Laterality Date   BIOPSY  06/29/2021   Procedure: BIOPSY;  Surgeon: Rush Landmark Telford Nab., MD;  Location: [facility] ENDOSCOPY;  Service: Gastroenterology;;   [place] N/A 12/13/2017   Procedure: BIV PACEMAKER GENERATOR CHANGEOUT;  Surgeon: [name], MD;  Location: [place] CV LAB;  Service: Cardiovascular;  Laterality: N/A;   CARDIAC VALVE REPLACEMENT     mitral valve, st jude mechanical    COLONOSCOPY  N/A 03/26/2014   Procedure: COLONOSCOPY;  Surgeon: Milus Banister, MD;  Location: [place];  Service: Endoscopy;  Laterality: N/A;   ENTEROSCOPY N/A 03/28/2014   Procedure: ENTEROSCOPY;  Surgeon: Milus Banister, MD;  Location: [place];  Service: Endoscopy;  Laterality: N/A;   ENTEROSCOPY N/A 06/29/2021   Procedure: ENTEROSCOPY;  Surgeon: Rush Landmark Telford Nab., MD;  Location: [place];  Service: Gastroenterology;  Laterality: N/A;   ESOPHAGOGASTRODUODENOSCOPY N/A 03/25/2014   Procedure: ESOPHAGOGASTRODUODENOSCOPY (EGD);  Surgeon: Gatha Mayer, MD;  Location: [facility] ENDOSCOPY;  Service: Endoscopy;  Laterality: N/A;   GIVENS CAPSULE STUDY N/A 03/26/2014   Procedure: GIVENS CAPSULE STUDY;  Surgeon: Milus Banister, MD;  Location: [place];  Service: Endoscopy;  Laterality: N/A;   HOT HEMOSTASIS N/A 06/29/2021   Procedure: HOT HEMOSTASIS (ARGON PLASMA COAGULATION/BICAP);  Surgeon: Irving Copas., MD;  Location: [place];  Service: Gastroenterology;  Laterality: N/A;   PACEMAKER INSERTION     inserted about 4-5 years ago   [place]  06/29/2021   Procedure: SUBMUCOSAL TATTOO INJECTION;  Surgeon: Irving Copas., MD;  Location[facility] ENDOSCOPY;  Service: Gastroenterology;;   Family History  Problem Relation Age of Onset   Coronary artery disease Mother    Cancer Father        GI   Cancer Brother        Lung   Social History   Socioeconomic History   Marital status: Divorced    Spouse name: Not on file   Number of children: 9   Years of education: Not on file   Highest education level: Not on file  Occupational History   Occupation: retired    Comment: [employer]  Tobacco Use   Smoking status: Former    Years: 0.00    Types: Cigarettes   Smokeless tobacco: Never  Vaping Use   Vaping Use: Never used  Substance and Sexual Activity   Alcohol use: No    Alcohol/week: 0.0 standard drinks   Drug use: No   Sexual activity: Not  Currently  Other Topics Concern   Not on file  Social History Narrative   Current Social History 02/16/2019        Patient lives with family (baby's 64, daughter and 4 grandkids:  72 yo twins, 27 yo and 53 yo in a home which is 1 story. There are 4 steps with handrails up to the entrance the patient uses.       Patient's method of transportation is via family member (baby's mama).      The highest level of education was high school diploma.      The patient currently retired.      Identified  important Relationships are "My 64 Joselyn Arrow, Loni Hubbard."       Pets : None       Interests / Fun: Sitting outdoors watching the birds and planes."       Exercise riding a bike 5 miles 3 days/week      Current Stressors: The grandkids       Religious / Personal Beliefs: "I believe in God."       L. Ducatte, RN, BSN       Social Determinants of Health   Financial Resource Strain: Not on file  Food Insecurity: Not on file  Transportation Needs: Not on file  Physical Activity: Not on file  Stress: Not on file  Social Connections: Not on file   No Known Allergies  Medications  (Not in a hospital admission)    Vitals   Vitals:   09/15/21 1701 09/15/21 1730 09/15/21 1800 09/15/21 1830  BP:  (!) 102/54 (!) 130/49 111/78  Pulse: (!) 125 72 82 76  Resp: 19 (!) 27 (!) 21 16  Temp:      TempSrc:      SpO2: 95% 98% 99% 97%     There is no height or weight on file to calculate BMI.  Physical Exam   General: Laying comfortably in bed; in no acute distress.  HENT: Normal oropharynx and mucosa. Normal external appearance of ears and nose.  Neck: Supple, no pain or tenderness  CV: No JVD. No peripheral edema.  Pulmonary: Symmetric Chest rise. Normal respiratory effort.  Abdomen: Soft to touch, non-tender.  Ext: No cyanosis, edema, or deformity  Skin: No rash. Normal palpation of skin.   Musculoskeletal: Normal digits and nails by inspection. No clubbing.   Neurologic  Examination  Mental status/Cognition: Alert, oriented to self, place, but not to month and year, good attention.  Speech/language: Fluent, comprehension intact, object naming intact, repetition intact. Cranial nerves:   CN II Pupils equal and reactive to light, no VF deficits    CN III,IV,VI EOM intact, no gaze preference or deviation, no nystagmus    CN V normal sensation in V1, V2, and V3 segments bilaterally    CN VII no asymmetry, no nasolabial fold flattening    CN VIII normal hearing to speech    CN IX & X normal palatal elevation, no uvular deviation    CN XI 5/5 head turn and 5/5 shoulder shrug bilaterally    CN XII midline tongue protrusion    Motor:  Muscle bulk: normal, tone normal, pronator drift none tremor none Mvmt Root Nerve  Muscle Right Left Comments  SA C5/6 Ax Deltoid 5 5   EF C5/6 Mc Biceps 5 5   EE C6/7/8 Rad Triceps 5 5   WF C6/7 Med FCR     WE C7/8 PIN ECU     F Ab C8/T1 U ADM/FDI 5 5   HF L1/2/3 Fem Illopsoas 4- 5   KE L2/3/4 Fem Quad 5 5   DF L4/5 D Peron Tib Ant 5 5   PF S1/2 Tibial Grc/Sol 5 5    Reflexes:  Right Left Comments  Pectoralis      Biceps (C5/6) 2 2   Brachioradialis (C5/6) 2 2    Triceps (C6/7) 2 2    Patellar (L3/4) 2 2    Achilles (S1)      Hoffman      Plantar     Jaw jerk    Sensation:  Light touch Intact throughout  Pin prick    Temperature    Vibration   Proprioception    Coordination/Complex Motor:  - Finger to Nose intact BL  Heel to shin with mild ataxia in LLE - Rapid alternating movement are slowed in LUE - Gait: deferred.  Labs   CBC:  Recent Labs  Lab 09/11/21 0429 09/15/21 1335  WBC 6.3 5.4  NEUTROABS  --  2.7  HGB 9.5* 9.3*  HCT 33.4* 32.9*  MCV 76.3* 76.2*  PLT 385 425*    Basic Metabolic Panel:  Lab Results  Component Value Date   NA 136 09/15/2021   K 4.1 09/15/2021   CO2 27 09/15/2021   GLUCOSE 102 (H) 09/15/2021   BUN 28 (H) 09/15/2021   CREATININE 2.11 (H) 09/15/2021   CALCIUM  9.2 09/15/2021   GFRNONAA 32 (L) 09/15/2021   GFRAA 41 (L) 01/15/2020   Lipid Panel:  Lab Results  Component Value Date   LDLCALC  12/15/2007    90        Total Cholesterol/HDL:CHD Risk Coronary Heart Disease Risk Table                     Men   Women  1/2 Average Risk   3.4   3.3   HgbA1c:  Lab Results  Component Value Date   HGBA1C  09/30/2007    5.3 (NOTE)   The ADA recommends the following therapeutic goals for glycemic   control related to Hgb A1C measurement:   Goal of Therapy:   < 7.0% Hgb A1C   Action Suggested:  > 8.0% Hgb A1C   Ref:  Diabetes Care, 22, Suppl. 1, 1999   Urine Drug Screen:     Component Value Date/Time   LABOPIA NONE DETECTED 09/15/2021 1428   COCAINSCRNUR NONE DETECTED 09/15/2021 1428   LABBENZ NONE DETECTED 09/15/2021 1428   AMPHETMU NONE DETECTED 09/15/2021 1428   THCU NONE DETECTED 09/15/2021 1428   LABBARB NONE DETECTED 09/15/2021 1428    Alcohol Level     Component Value Date/Time   ETH <10 09/15/2021 1335   CT Head without contrast(Personally reviewed): CTH was negative for a large hypodensity concerning for a large territory infarct or hyperdensity concerning for an ICH  CT angio Head and Neck with contrast: pending  MRI Brain: Unable to get MRI, PPM not compatible.   Impression   KOHLMAN BIANCA is a 76 y.o. male with PMH significant for with PMH significant for afibb/flutter on warfarin, hx of 1st degree AV block, hx of mechanical mitral valve, BPH, CKD 3, HTN, s/p ICD placement who presents with L lower chest and upper abdominal numbness after being hit in the left chest by a chair that he was dragging. He is a poor historian and does appear to have significant difficulty recalling details of the event today. I do have some concerns for underlying cognitive impairment based on my conversation. Collateral history is limited as his friend did not witness the event. It is unclear if this truly is a stroke. Suspect that this could be an  underlying small vessel stroke given he does have mild ataxia and clumsiness on the left. As for further workup and management, I think I would recommend getting a CT Angio head and neck to evaluate for any significant stenosis of the large vessel which might makes [place] consider adding an antiplatelet agent but i would be very cautious given he is high risk for hemorrhage given warfarin. I dont think getting  a echo would be helpful from a neuro standpoint since he is already on warfarin.  If the CT Angio does not demonstrate any significant stenosis, i would not do further stroke workup.  Will also do basic workup to evaluate for reversible causes of dementia with Vit B12, folate, RPR. He recently had TSH which was normal.  Impression: - possible small vessel stroke/TIA. - concern for underlying dementia.  Recommendations  - I ordered CT angio head and neck - I ordered Vit B12, folate. - further workup pending above. ______________________________________________________________________   Thank you for the opportunity to take part in the care of this patient. If you have any further questions, please contact the neurology consultation attending.  Signed,  [place] Pager Number [phone] _ _ _   _ __   _ __ _ _  __ __   _ __   __ _

## 2021-09-15 NOTE — Telephone Encounter (Signed)
Pt AID call to let the Dr know she had to call EMS this morning pt is back at the hospital he was having  numbness in his ( left ) arm .. she is not sure if he is going to be sent home or not  ?

## 2021-09-15 NOTE — H&P (Signed)
? ? ? ?Date: 09/15/2021     ?     ?     ?Patient Name:  [name] MRN: [id number]  ?DOB: Jan 05, 1946 Age / Sex: 76 y.o., male   ?PCP: [name], MD    ?     ?Medical Service: Internal Medicine Teaching Service    ?     ?Attending Physician: Dr. Valarie Merino, MD    ?First Contact: Dr. Marlou Sa, MD Pager: [phone]  ?Second Contact: Dr. Coy Saunas, MD Pager: [phone]  ?     ?After Hours (After 5p/  First Contact Pager: [phone]  ?weekends / holidays): Second Contact Pager: [phone]  ? ?Chief Complaint: Arm numbness/presyncope ? ?History of Present Illness: Dorman T. Fick is a 76 y.o. male with PMH of atrial flutter/fibrillation, 1st degree AVB, BPH, HFrEF (35-40%), CKD IIIa, HTN, ICD placement, mechanical mitral valve who presented via EMS to the ER for evaluation of dizziness and left arm numbness. According to family, patient started complaining of left arm numbness and dizziness around noon. They called EMS due to concern for possible stroke. They believe his numbness lasted for about 30 minutes but they did not notice any facial droops or focal weakness. On arrival, patient's symptoms have resolved.  On evaluation, patient's remember waking up in the morning and doing his daily activities but does not remember anything else concerning his symptoms.  He currently denies any dizziness, headaches, blurry vision, chest pain, shortness of breath, palpitations, abdominal pain, leg swelling, weakness, numbness/tingling.  States he feels well and does not know why he is here. ? ?Meds:  ?Current Meds  ?Medication Sig  ? carvedilol (COREG) 6.25 MG tablet Take 1 tablet (6.25 mg total) by mouth 2 (two) times daily with a meal.  ? lisinopril (ZESTRIL) 2.5 MG tablet Take 1 tablet (2.5 mg total) by mouth daily. NEEDS APPOINTMENT FOR ANY MORE REFILLS (Patient taking differently: Take 2.5 mg by mouth daily.)  ? pantoprazole (PROTONIX) 40 MG tablet TAKE 1 TABLET (40 MG TOTAL) BY MOUTH TWICE A DAY BEFORE MEALS (Patient taking  differently: Take 40 mg by mouth 2 (two) times daily before a meal.)  ? tamsulosin (FLOMAX) 0.4 MG CAPS capsule Take 2 capsules (0.8 mg total) by mouth daily after breakfast. (Patient taking differently: Take 0.8 mg by mouth every evening.)  ? torsemide (DEMADEX) 20 MG tablet Take 1 tablet (20 mg total) by mouth 2 (two) times daily.  ? ? ?Allergies: ?Allergies as of 09/15/2021  ? (No Known Allergies)  ? ?Past Medical History:  ?Diagnosis Date  ? Acute GI bleeding 06/27/2021  ? Anemia 01/29/2016  ? Atrial fibrillation ([place])   ? post op.  s/p dc-cv 04/2008. previously on amiodarone  ? Atrial flutter ([place])   ? atypical  ? AV block, 1st degree   ? .450 msec--progressive  ? 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  a- Echo 07/2008 Ef 55% mild peroprosthetic MVR with High transmitral gradient (mean 14).  b- normal coronaries by cath 12/2007  ? BPH (benign prostatic hyperplasia)   ? Cardiac resynchronization therapy pacemaker (CRT-P) in place 06/22/2011  ? CHF (congestive heart failure) ([place])   ? EF 35-40 % 2011 due to valvular disease and diastolic dysfunction  ? Chronic back pain   ? CKD (chronic kidney disease), stage III ([place])   ? COVID-19 virus infection 07/24/2020  ? Tested positive on July 16, 2020  ? Dysphagia   ? with normal barium  swallow 09/2008  ? Endocarditis   ? GERD (gastroesophageal reflux disease)   ? Heavy alcohol use   ? history  ? History of atrial flutter 03/13/2008  ? History of atrial flutter - resolved.   ? History of GI bleed   ? Hypertension   ? IV drug abuse ([place])   ? history of  ? Lower GI bleed 01/2016  ? Pacemaker 2010  ? ? ?Family History: Two brothers had cancer of unknown type.  History of Alzheimer's in brother and father. ? ?Social History: Mr. Jagielski lives with his friend, daughter and 5 grandchildren. He takes care of his own ADLs/IADLs. He is [facility] in [facility]. Denies EtOH or illicit drug use. ? ?Review of Systems: ?A complete ROS was negative except  as per HPI.  ? ?Physical Exam: ?Blood pressure 111/78, pulse 76, temperature 98.8 ?F (37.1 ?C), temperature source Oral, resp. rate 16, SpO2 97 %. ? ?General: Pleasant, well-appearing middle-age laying in bed. No acute distress. ?HEENT: MMM. PERRLA. Nonicteric sclera. ?Neck: Supple.  No JVD. ?CV: Tachycardic.  Irregularly irregular rhythm.  No murmurs, rubs, or gallops. Trace BLE edema ?Pulmonary: Lungs CTAB. Normal effort.  Mild rales at the bases bilaterally. No wheezing. ?Abdominal: Soft, nontender, nondistended. Normal bowel sounds.  ?Extremities: Radial and DP pulses 2+ and symmetric. ?Skin: Warm and dry. Bruises around periumbilical area ?Neuro: Alert and oriented x2. Oriented to self and place. Not oriented to person or time. Thinks it is 2004 and [name] is Software engineer. Moves all extremities. Normal sensation to gross touch. Normal finger-to-nose testing. Strength 5/5 in all extremities. ?Psych: Normal mood and affect ? ?EKG: personally reviewed my interpretation is A-fib with HR 72 ? ?CXR: personally reviewed my interpretation is improved pulmonary edema.  ? ?Assessment & Plan by Problem: ?Principal Problem: ?  TIA (transient ischemic attack) ? ? ?#TIA ?#Dizziness ?Patient presented via EMS to the ER due to concern for possible stroke. Per family, patient reported left arm numbness and dizziness in the late mornings. Patient's symptoms completely resolved on arrival. CT head negative for acute intracranial abnormality.  Unable to obtain MRI brain due to patient's pacemaker.  Neuro consulted by ER doc and did not recommend further stroke work-up.  Neuro exam unremarkable on my assessment with no focal deficits.  UDS and UA negative. CMP shows mild AKI but no electrolyte abnormalities. Hemoglobin stable at 9.3. S/p 250 cc of IV fluids in the ER. ?--Admit for observation ?--Orthostatic vital ?--Telemetry ? ?#HFrEF ?Recent echo on 3/8 showed EF 35 to 40%, regional wall motion abnormalities, moderate LVH, severe  dyskinesis of LV, severely dilated LA and moderately dilated RA. Patient is not significantly fluid overloaded on exam but does have some crackles on auscultation of the lungs with trace BLE edema. Patient denies any shortness of breath or chest pain. Patient has received torsemide dose today. CXR shows improved bibasilar infiltrate compared to the previous one on my read.  ?--F/u BNP ?--Continue home torsemide 20 mg BID ?--Strict I&O, Daily weights ?--Keep K+ >4.0, Mg >2.0 ? ?#AKI on CKD 3a ?Baseline creatinine around 1.7-1.8. Patient presented with creatinine of 2.11 on admission. Reported dizziness at home per family however patient denies any dizziness on assessment. No electrolyte abnormalities. Received 250 cc of fluid in the ER.  ?--Avoid nephrotoxicity ?--Daily BMP ? ?#Hx A-fib/aflutter ?#Hx 1st-degree AVB ?#Hx MVR with mechanical valve ?Patient remains in A-fib on arrival. EKG showed A-fib with a rate of 72. Patient denies any dizziness or shortness of breath.  PT/INR 30.2/2.9.  INR within goal of 2-3. ?--Interrogate pacemaker ?--Warfarin per pharmacy ?--Telemetry ?--Keep K+ >4.0, Mg >2.0 ? ?#HTN ?Home regimen includes Coreg 6.25 mg twice daily and lisinopril 2.5 mg daily.  Patient presented with a BP of 113/74. SBP has ranged from the 110s to 130s. ?--Orthostatic vital ?--Hold home antihypertensives ? ?#Hx BPH ?Making appropriate urine. Denies any dysuria or hematuria. Normal PSA during recent admission.  ?--Continue home Flomax ? ?CODE STATUS: Full ?DIET: Heart healthy ?PPx: Lovenox ? ?Dispo: Admit patient to Observation with expected length of stay less than 2 midnights. ? ?Signed: ?Lacinda Axon, MD ?09/15/2021, 6:42 PM  ?Pager: [phone] ?Internal Medicine Teaching Service ?After 5pm on weekdays and 1pm on weekends: On Call pager: [phone] ? ?

## 2021-09-16 ENCOUNTER — Observation Stay (HOSPITAL_COMMUNITY): Payer: 59

## 2021-09-16 ENCOUNTER — Ambulatory Visit: Payer: 59

## 2021-09-16 ENCOUNTER — Encounter: Payer: 59 | Admitting: Student

## 2021-09-16 DIAGNOSIS — G459 Transient cerebral ischemic attack, unspecified: Secondary | ICD-10-CM | POA: Diagnosis not present

## 2021-09-16 DIAGNOSIS — Z952 Presence of prosthetic heart valve: Secondary | ICD-10-CM | POA: Diagnosis not present

## 2021-09-16 DIAGNOSIS — I951 Orthostatic hypotension: Secondary | ICD-10-CM | POA: Diagnosis not present

## 2021-09-16 DIAGNOSIS — I482 Chronic atrial fibrillation, unspecified: Secondary | ICD-10-CM | POA: Diagnosis not present

## 2021-09-16 DIAGNOSIS — R42 Dizziness and giddiness: Secondary | ICD-10-CM | POA: Diagnosis not present

## 2021-09-16 LAB — LIPID PANEL
Cholesterol: 153 mg/dL (ref 0–200)
HDL: 43 mg/dL (ref >40)
LDL Cholesterol: 80 mg/dL (ref 0–99)
Total CHOL/HDL Ratio: 3.6 RATIO
Triglycerides: 148 mg/dL (ref <150)
VLDL: 30 mg/dL (ref 0–40)

## 2021-09-16 LAB — CBC
HCT: 33.4 % — ABNORMAL LOW (ref 39.0–52.0)
Hemoglobin: 9.7 g/dL — ABNORMAL LOW (ref 13.0–17.0)
MCH: 21.5 pg — ABNORMAL LOW (ref 26.0–34.0)
MCHC: 29 g/dL — ABNORMAL LOW (ref 30.0–36.0)
MCV: 74.1 fL — ABNORMAL LOW (ref 80.0–100.0)
Platelets: 425 10*3/uL — ABNORMAL HIGH (ref 150–400)
RBC: 4.51 MIL/uL (ref 4.22–5.81)
RDW: 19.1 % — ABNORMAL HIGH (ref 11.5–15.5)
WBC: 6.6 10*3/uL (ref 4.0–10.5)
nRBC: 0 % (ref 0.0–0.2)

## 2021-09-16 LAB — PHOSPHORUS: Phosphorus: 3.4 mg/dL (ref 2.5–4.6)

## 2021-09-16 LAB — MAGNESIUM: Magnesium: 1.9 mg/dL (ref 1.7–2.4)

## 2021-09-16 LAB — BASIC METABOLIC PANEL
Anion gap: 9 (ref 5–15)
BUN: 29 mg/dL — ABNORMAL HIGH (ref 8–23)
CO2: 27 mmol/L (ref 22–32)
Calcium: 9.5 mg/dL (ref 8.9–10.3)
Chloride: 101 mmol/L (ref 98–111)
Creatinine, Ser: 2.01 mg/dL — ABNORMAL HIGH (ref 0.61–1.24)
GFR, Estimated: 34 mL/min — ABNORMAL LOW (ref >60)
Glucose, Bld: 96 mg/dL (ref 70–99)
Potassium: 4.1 mmol/L (ref 3.5–5.1)
Sodium: 137 mmol/L (ref 135–145)

## 2021-09-16 LAB — PROTIME-INR
INR: 2.8 — ABNORMAL HIGH (ref 0.8–1.2)
Prothrombin Time: 29.5 seconds — ABNORMAL HIGH (ref 11.4–15.2)

## 2021-09-16 LAB — VITAMIN B12: Vitamin B-12: 331 pg/mL (ref 180–914)

## 2021-09-16 LAB — HEMOGLOBIN A1C
Hgb A1c MFr Bld: 5.7 % — ABNORMAL HIGH (ref 4.8–5.6)
Mean Plasma Glucose: 117 mg/dL

## 2021-09-16 LAB — FOLATE: Folate: 18.2 ng/mL (ref >5.9)

## 2021-09-16 MED ORDER — ATORVASTATIN CALCIUM 20 MG PO TABS
20.0000 mg | ORAL_TABLET | Freq: Every evening | ORAL | 2 refills | Status: DC
Start: 2021-09-16 — End: 2021-12-19

## 2021-09-16 MED ORDER — LACTATED RINGERS IV SOLN: INTRAVENOUS | Status: DC

## 2021-09-16 MED ORDER — WARFARIN SODIUM 2.5 MG PO TABS
2.5000 mg | ORAL_TABLET | Freq: Once | ORAL | Status: AC
Start: 2021-09-16 — End: 2021-09-16
  Administered 2021-09-16: 2.5 mg via ORAL
  Filled 2021-09-16: qty 1

## 2021-09-16 MED ORDER — LACTATED RINGERS IV BOLUS
250.0000 mL | Freq: Once | INTRAVENOUS | Status: AC
  Administered 2021-09-16: 250 mL via INTRAVENOUS

## 2021-09-16 MED ORDER — ATORVASTATIN CALCIUM 10 MG PO TABS
20.0000 mg | ORAL_TABLET | Freq: Every evening | ORAL | Status: DC
  Administered 2021-09-16: 20 mg via ORAL
  Filled 2021-09-16: qty 2

## 2021-09-16 MED ORDER — IOHEXOL 350 MG/ML SOLN
50.0000 mL | Freq: Once | INTRAVENOUS | Status: AC | PRN
  Administered 2021-09-16: 50 mL via INTRAVENOUS

## 2021-09-16 NOTE — Progress Notes (Incomplete)
? ?  HD#0 ?SUBJECTIVE:  ?Patient Summary: [name] is a 76 y.o. male with PMH of atrial flutter/fibrillation, 1st degree AVB, BPH, HFrEF (35-40%), CKD IIIa, HTN, ICD placement, mechanical mitral valve who presented via EMS to the ER for evaluation of dizziness and left arm numbness admitted for TIA.   ? ?Overnight Events: Unable to obtain CTA head/neck due to AKI. Neurohospitalist recommending deferring for one day to allow improvement of serum creatinine. ? ?Interim History: *** ? ?OBJECTIVE:  ?Vital Signs: ?Vitals:  ? 09/15/21 2129 09/16/21 0011 09/16/21 0300 09/16/21 0328  ?BP: 114/65 116/68  117/82  ?Pulse: 83 81  79  ?Resp: 16 18  18   ?Temp: 98.4 ?F (36.9 ?C) 98.9 ?F (37.2 ?C)  98.9 ?F (37.2 ?C)  ?TempSrc: Oral Oral  Oral  ?SpO2: 100% 95%  96%  ?Weight:   82.4 kg   ?Height:      ? ?Supplemental O2: {NAMES:3044014::"Room Air","Nasal Cannula","Simple Face Mask","Partial Rebreather","HFNC","Non Rebreather","Venturi Mask","Bag Valve Mask"} ?SpO2: 96 % ? ?Filed Weights  ? 09/15/21 2124 09/16/21 0300  ?Weight: 82.4 kg 82.4 kg  ? ? ?No intake or output data in the 24 hours ending 09/16/21 0723 ?Net IO Since Admission: No IO data has been entered for this period [09/16/21 0723] ? ?Physical Exam: ?Physical Exam ? ?Patient Lines/Drains/Airways Status   ? ? Active Line/Drains/Airways   ? ? Name Placement date Placement time Site Days  ? Peripheral IV 09/15/21 20 G Left Antecubital 09/15/21  1412  Antecubital  1  ? ?  ?  ? ?  ? ? ? ?ASSESSMENT/PLAN:  ?Assessment: ?Principal Problem: ?  TIA (transient ischemic attack) ? ? ?Plan: ?#TIA ?#Dizziness ?Work-up thus far concerning for TIA, however given pacemaker MRI is not an option and with AKI on CKD 3a he has not been able to have CTA head/neck done. Serum creatinine has improved at this time and is borderline elevated to degree of AKI. Orthostatic vital signs: Lying BP 114/65 HR 83, sitting BP 104/70 HR 94, standing BP 87/49 HR 92--patient does have orthostasis.   ?-CTA head/neck ?  ?#HFrEF ?BNP 96.7, actually improved from last discharge (174.9).  ? ?--Continue home torsemide 20 mg BID ?--Strict I&O, Daily weights ?--Keep K+ >4.0, Mg >2.0 ?  ?#AKI on CKD 3a ?Baseline creatinine around 1.7-1.8. Patient presented with creatinine of 2.11, improved to 2.01 today but will be delaying factor for CTA head and net.  ? ?--Avoid nephrotoxicity ?--Daily BMP ?  ?#Hx A-fib/aflutter ?#Hx 1st-degree AVB ?#Hx MVR with mechanical valve ?Rate controlled at this time. PT/INR 29.5/2.8. INR within goal of 2.5-3.5. ?--Interrogate pacemaker ?--Warfarin per pharmacy ?--Telemetry ?--Keep K+ >4.0, Mg >2.0 ?  ?#HTN ?Home regimen includes Coreg 6.25 mg twice daily and lisinopril 2.5 mg daily.  Normotensive. SBP has ranged from the 110s to 130s. ?--Orthostatic vital ?--Hold home antihypertensives ?  ?#Hx BPH ?Making appropriate urine. Denies any dysuria or hematuria. Normal PSA during recent admission.  ?--Continue home Flomax ? ?Best Practice: ?Diet: {CHL DISCHARGE DIET:21201} ?IVF: Fluids: {Meds; iv fluids:31617}, Rate: {NAMES:3044014::"None","*** cc/hr x *** hrs","*** cc bolus"} ?VTE:  ?Code: {NAMES:3044014::"Full","DNR","DNI","DNR/DNI","Comfort Care","Unknown"} ?AB: *** ?Therapy Recs: {NAMES:3044014::"None","Pending","CIR","SNF","ALF","LTAC","Home Health"}, DME: {[employer] FA:[id number] ?Family Contact: ***, {Family:[id number]::"to be notified."} ?DISPO: Anticipated discharge {NAMES:3044014::"today","tomorrow","in *** days"} to {Discharge Destination:18313::"Home"} pending {BARRIERS TO DISCHARGE:24135}. ? ?Signature: ?Farrel Gordon, D.O.  ?Internal Medicine Resident, PGY-1 ?[facility]  ?Pager: [phone] ?7:23 AM, 09/16/2021  ? ?Please contact the on call pager after 5 pm and on weekends at [phone]. ? ?

## 2021-09-16 NOTE — Progress Notes (Addendum)
STROKE TEAM PROGRESS NOTE  ? ?INTERVAL HISTORY ?No family at bedside. Patient was seen sitting in bed comfortably. He was awake, alert and engaging during evaluation. He exhibited left sided weakness in UE and LE that he reported is chronic.  ?Patient reported that he was sitting in the garden, when he stood up and became dizzy. When he tried to sit back down, he fell onto the chair and hit his chest, to which he attributed his chest pain that has now resolved. Reported the chest pain radiated down his left arm. Denied chest tenderness on exam.  ? ?Vitals:  ? 09/16/21 0011 09/16/21 0300 09/16/21 0328 09/16/21 0747  ?BP: 116/68  117/82 107/65  ?Pulse: 81  79 89  ?Resp: 18  18 16   ?Temp: 98.9 ?F (37.2 ?C)  98.9 ?F (37.2 ?C) 99.2 ?F (37.3 ?C)  ?TempSrc: Oral  Oral Oral  ?SpO2: 95%  96% 97%  ?Weight:  82.4 kg    ?Height:      ? ?CBC:  ?Recent Labs  ?Lab 09/15/21 ?1335 09/16/21 ?0316  ?WBC 5.4 6.6  ?NEUTROABS 2.7  --   ?HGB 9.3* 9.7*  ?HCT 32.9* 33.4*  ?MCV 76.2* 74.1*  ?PLT 425* 425*  ? ?Basic Metabolic Panel:  ?Recent Labs  ?Lab 09/11/21 ?0429 09/15/21 ?1335 09/16/21 ?0316  ?NA 135 136 137  ?K 3.8 4.1 4.1  ?CL 102 98 101  ?CO2 22 27 27   ?GLUCOSE 114* 102* 96  ?BUN 29* 28* 29*  ?CREATININE 1.91* 2.11* 2.01*  ?CALCIUM 9.4 9.2 9.5  ?MG 1.8  --  1.9  ?PHOS  --   --  3.4  ? ?Lipid Panel:  ?Recent Labs  ?Lab 09/16/21 ?0316  ?CHOL 153  ?TRIG 148  ?HDL 43  ?CHOLHDL 3.6  ?VLDL 30  ?Chase 80  ? ?HgbA1c: No results for input(s): HGBA1C in the last 168 hours. ?Urine Drug Screen:  ?Recent Labs  ?Lab 09/15/21 ?1428  ?LABOPIA NONE DETECTED  ?COCAINSCRNUR NONE DETECTED  ?LABBENZ NONE DETECTED  ?AMPHETMU NONE DETECTED  ?THCU NONE DETECTED  ?LABBARB NONE DETECTED  ? ?Alcohol Level  ?Recent Labs  ?Lab 09/15/21 ?1335  ?ETH <10  ? ? ?IMAGING past 24 hours ?X-ray chest PA and lateral ? ?Result Date: 09/15/2021 ?CLINICAL DATA:  Patient here from home with complaint of dizziness when standing that started at approximately 1200 today, also  having headache and left arm numbness. EXAM: CHEST - 2 VIEW COMPARISON:  Chest x-ray 09/09/2021. FINDINGS: Left chest wall three lead pacemaker in similar position. Status post mitral valve replacement. The heart and mediastinal contours are unchanged. Persistent prominent hilar vasculature. Aortic calcification. No focal consolidation. Slightly decreased prominent interstitial markings. No pleural effusion. No pneumothorax. No acute osseous abnormality. IMPRESSION: 1. Pulmonary edema slightly improved. 2.  Aortic Atherosclerosis (ICD10-I70.0). Electronically Signed   By: Iven Finn M.D.   On: 09/15/2021 20:41  ? ?CT Head Wo Contrast ? ?Result Date: 09/15/2021 ?CLINICAL DATA:  Dizziness with standing Neurological EXAM: CT HEAD WITHOUT CONTRAST TECHNIQUE: Contiguous axial images were obtained from the base of the skull through the vertex without intravenous contrast. RADIATION DOSE REDUCTION: This exam was performed according to the departmental dose-optimization program which includes automated exposure control, adjustment of the mA and/or kV according to patient size and/or use of iterative reconstruction technique. COMPARISON:  None. FINDINGS: Brain: No evidence of acute infarction, hemorrhage, hydrocephalus, extra-axial collection or mass lesion/mass effect. Diffuse cortical atrophy. Vascular: No hyperdense vessel or unexpected calcification. Skull: Normal. Negative for fracture  or focal lesion. Sinuses/Orbits: There has been prior resection of the medial walls of the maxillary sinuses. There is partial opacification of the left maxillary sinus. Deformity of the medial wall of the orbits is consistent with prior fracture. Other: None. IMPRESSION: No acute intracranial abnormality. Electronically Signed   By: Miachel Roux M.D.   On: 09/15/2021 15:23   ? ?PHYSICAL EXAM ?Constitutional: Appears well-developed and well-nourished.  ?Psych: Affect appropriate to situation ?Respiratory: Effort normal, non-labored  breathing ? ?Neuro: ?Mental Status: ?Patient is awake, alert, oriented to person, place, month, and situation. Reported 2003.  ?Patient is able to give a clear and coherent history. ?No signs of aphasia or neglect ?Cranial Nerves: ?II: Visual Fields are full. Pupils are equal, round, and reactive to light.   ?III,IV, VI: EOMI without ptosis or diploplia.  ?V: Facial sensation is symmetric to temperature ?VII: Facial movement is symmetric resting and smiling ?VIII: Hearing is intact to voice ?XI: Shoulder shrug is symmetric ?XII: Tongue protrudes midline without atrophy or fasciculations ?Motor ?Tone is normal. Bulk is normal. 3/5 strength LUE with drifting. 4.5/5 strength LLE. Right upper and lower extremities 5/5 strength. ?Sensory: ?Sensation is symmetric to light touch and temperature in the arms and legs. No extinction to DSS present.  ?Cerebellar: ?FNF intact bilaterally ? ?ASSESSMENT/PLAN ?[name] is a 76 y.o. male with history of afib/flutter on warfarin, hx of 1st degree AV block, hx of mechanical mitral valve, HFrEF (35-40%), BPH, CKD 3, HTN, s/p ICD placement who presents with dizziness with standing, questionable left arm numbness, headache and sore in her breast..  ? ?orthostatic hypotension vs. TIA ?Code Stroke CT head: No acute abnormality.  ?24-hour repeat CT no acute abnormality ?CTA head & neck soft plaque at subclavian arteries and proximal CCAs.  Bilateral ICA bulb calcified plaque without stenosis. ?MRI not able to perform due to ICD and mechanical valve ?2D Echo: (09/10/2021) LVEF 35-40%. LV regional wall motion abnormalities, LV hypertrophy. Severe dyskinesis of the left ventricular, entire inferior wall. Reuced RV systolic function. L and R atrium dilated. ?LDL 80 (09/16/2021) ?HgbA1c Pending ?VTE prophylaxis -  warfarin (COUMADIN)  ?warfarin daily prior to admission, now on warfarin daily INR at goal, continue at discharge ?Therapy recommendations:  Outpatient PT ?Disposition:  Home  wit OP PT ? ?Hypertension ?Orthostatic hypotension ?Home meds: Coreg 6.25 mg twice daily and lisinopril 2.5 mg daily, holding home meds given elevated Cr and normotensive ?Orthostatic vitals showed BP 114/65 lying, 104/70 sitting, 87/49 standing ?Long-term BP goal normotensive ?Avoid low BP ?TED hose ?Management per primary team ? ?Hyperlipidemia ?Home meds:  N/A ?LDL 80, goal < 70 ?Recommend low-dose Lipitor 20 given advanced age and LDL close to goal ?Continue statin on discharge ? ?PAF ?Cardiomyopathy ?Status post mitral valve mechanical valve ?Status post ICD ?First-degree AV block ?On Coumadin ?INR 2.9-2.8 ?Continue Coumadin on discharge ?EF 35 to 40%, on torsemide ? ?Other Stroke Risk Factors ?Advanced Age >/= 35  ?Former cigarette smoker ?H/o EtOH abuse, IVDU ? ?Other acute issues: ?AKI on CKD 3a, Creatinine 1.82->2.01 ?BPH, flomax 0.8 mg daily ?Cognitive impairment ? ?Hospital day # 0 ? ?Signed: ?Richard Brittle, DO ?Resident, PGY-1 ?[facility] ?09/16/2021, 2:44 PM  ? ?ATTENDING NOTE: ?I reviewed above note and agree with the assessment and plan. Pt was seen and examined.  ? ?76 year old male with history of A-fib on warfarin, first degree AV block, status post mitral valve mechanical valve, status post ICD, hypertension, CKD 3 admitted for dizziness with  standing, possible headache, short of breath, left arm numbness.  Symptoms all resolved now.  INR 2.9-2.8.  CT no acute abnormality.  Repeat CT no acute abnormality.  CTA head and neck bilateral CCA's, ICA bulb atherosclerosis, however no significant stenosis.  EF 35 to 40%.  UDS negative.  LDL 80, A1c pending.  Creatinine 1.82-2.01.  Not able to perform MRI due to ICD and mechanical valve.  Orthostatic vital showed orthostatic hypotension. ? ?On exam, patient sitting in chair, awake alert, however only orientated to self, not orientated to place, time or situation.  Cannot have accurate recall regarding the event.  On exam, no focal deficit  except left arm mild drift likely chronic per interim as a resident given similar finding last admission. ? ?Etiology for patient symptoms not quite clear, however concerning for orthostatic hypotension gi

## 2021-09-16 NOTE — Progress Notes (Signed)
ANTICOAGULATION CONSULT NOTE ? ?Pharmacy Consult: warfarin ?Indication: atrial fibrillation ? ?No Known Allergies ? ?Patient Measurements: ?Height: 5\' 8"  (172.7 cm) ?Weight: 82.4 kg (181 lb 10.5 oz) ?IBW/kg (Calculated) : 68.4 ? ?Vital Signs: ?Temp: 99.2 ?F (37.3 ?C) (03/14 0747) ?Temp Source: Oral (03/14 0747) ?BP: 107/65 (03/14 0747) ?Pulse Rate: 89 (03/14 0747) ? ?Labs: ?Recent Labs  ?  09/15/21 ?1335 09/15/21 ?1608 09/16/21 ?0316  ?HGB 9.3*  --  9.7*  ?HCT 32.9*  --  33.4*  ?PLT 425*  --  425*  ?APTT 57*  --   --   ?LABPROT 30.2*  --  29.5*  ?INR 2.9*  --  2.8*  ?CREATININE 2.11*  --  2.01*  ?TROPONINIHS 39* 46*  --   ? ? ? ?Estimated Creatinine Clearance: 32.2 mL/min (A) (by C-G formula based on SCr of 2.01 mg/dL (H)). ? ?Assessment: ?33 YOM presented with dizziness when standing. Pt is on warfarin PTA for hx AFib and mMVR. Of note, patient has history of GI bleeding in Dec 2022. Patient was being bridged with Lovenox PTA, but Lovenox was not resumed with therapeutic INR. Of 2.9 on admission. Patient reports last warfarin dose was 09/14/21. Pharmacy consulted to manage warfarin. ? ?Home warfarin dose is 5mg  Thurs/Sat/Sun, 2.5mg  AODs. Last INR in clinic was 3.8 mg. ? ?INR 2.8, therapeutic  ?CBC stable. 100% of meal eaten. ? ?Goal of Therapy:  ?INR goal 2.5-3.5 ?Monitor platelets by anticoagulation protocol: Yes ?  ?Plan:  ?Warfarin 2.5mg  PO x1 today ?Daily INR and warfarin dosing ? ?Thank you for allowing pharmacy to participate in this patient's care. ? ?11/14/21, PharmD ?PGY1 Acute Care Resident  ?09/16/2021,8:44 AM ? ? ? ? ? ? ? ?

## 2021-09-16 NOTE — Discharge Summary (Signed)
? ?Name: [name] ?MRN: [id number] ?DOB: 1945/12/06 76 y.o. ?PCP: Richard Payment, MD ? ?Date of Admission: 09/15/2021  1:03 PM ?Date of Discharge:  09/16/2021 ?Attending Physician: [name] ? ?DISCHARGE DIAGNOSIS:  ?Primary Problem: TIA (transient ischemic attack)  ? ?Hospital Problems: ?Principal Problem: ?  TIA (transient ischemic attack) ?  ? ?DISCHARGE MEDICATIONS:  ? ?Allergies as of 09/16/2021   ?No Known Allergies ?  ? ?  ?Medication List  ?  ? ?STOP taking these medications   ? ?lisinopril 2.5 MG tablet ?Commonly known as: ZESTRIL ?  ? ?  ? ?TAKE these medications   ? ?acetaminophen 500 MG tablet ?Commonly known as: TYLENOL ?Take 1,000 mg by mouth every 6 (six) hours as needed (pain). ?  ?atorvastatin 20 MG tablet ?Commonly known as: LIPITOR ?Take 1 tablet (20 mg total) by mouth every evening. ?  ?carvedilol 6.25 MG tablet ?Commonly known as: COREG ?Take 1 tablet (6.25 mg total) by mouth 2 (two) times daily with a meal. ?  ?diclofenac Sodium 1 % Gel ?Commonly known as: VOLTAREN ?APPLY 4 G TOPICALLY 4 TIMES DAILY ?  ?enoxaparin 80 MG/0.8ML injection ?Commonly known as: LOVENOX ?Inject 1 syringe (80 mg total) into the skin every 12 (twelve) hours. ?  ?pantoprazole 40 MG tablet ?Commonly known as: PROTONIX ?TAKE 1 TABLET (40 MG TOTAL) BY MOUTH TWICE A DAY BEFORE MEALS ?What changed:  ?how much to take ?how to take this ?when to take this ?additional instructions ?  ?tamsulosin 0.4 MG Caps capsule ?Commonly known as: FLOMAX ?Take 2 capsules (0.8 mg total) by mouth daily after breakfast. ?  ?torsemide 20 MG tablet ?Commonly known as: DEMADEX ?Take 1 tablet (20 mg total) by mouth 2 (two) times daily. ?  ?warfarin 7.5 MG tablet ?Commonly known as: COUMADIN ?Take as directed. If you are unsure how to take this medication, talk to your nurse or doctor. ?Original instructions: Take 1 tablet (7.5 mg total) by mouth one time only at 4 PM. ?What changed: Another medication with the same name was changed. Make sure  you understand how and when to take each. ?  ?warfarin 5 MG tablet ?Commonly known as: COUMADIN ?Take as directed. If you are unsure how to take this medication, talk to your nurse or doctor. ?Original instructions: Take  1/2 tablet (2.5 mg) tablet by mouth on Tuesdays, Wednesdays, and Fridays.Then take whole tablet (5mg ) rest of days ?What changed:  ?how much to take ?additional instructions ?  ? ?  ? ? ?DISPOSITION AND FOLLOW-UP:  ?[name] was discharged from [facility] in Stable condition. At the hospital follow up visit please address: ? ?Possible TIA ?Positive orthostatic vital signs ?Encourage adequate PO intake. Reassess orthostatic vital signs. Consider MoCA due to concerns of progressive memory loss. Will start him on Lipitor given CTA findings and LDL above goal. ?  ?AKI on CKD 3a ?Monitor BMP for improvement/resolution of AKI and restart lisinopril as indicated. Consider nephrology referral for CKD 3a. ?  ?HTN ?Home antihypertensives were held given mild hypotension on arrival and positive orthostatic vital signs.  ?  ?Hx BPH ?Chronic condition. Continued home Flomax. Consider urology referral for BPH. ? ?Follow-up Recommendations: ?Consults: Nephrology, urology ?Labs: Basic Metabolic Profile ?Studies: MoCA ?Medications:  ? ?START: Lipitor 20 mg daily for cholesterol. ?STOP: Lisinopril 2.5 mg daily ?Continue all other medications as prescribed. ? ?Follow-up Appointments: ? Follow-up Information   ? ? [place]. Schedule an appointment as soon as possible  for a visit in 1 week(s).   ?Specialty: Rehabilitation ?Contact information: ?[address] ?[id number] ?[place] ?[phone] ? ?  ?  ? ? Richard Payment, MD Follow up in 2 day(s).   ?Specialty: Internal Medicine ?Why: Our clinic will call you to schedule this appointment. ?Contact information: ?[address] ?[phone] ?[place]  [address] ?[phone] ? ? ?  ?  ? ?  ?  ? ?  ? ? ?HOSPITAL COURSE:  ?Patient Summary: ?Possible TIA ?Positive orthostatic vital signs ?Patient presented via EMS with dizziness and L arm numbness. This episode lasted for about 30 minutes and had resolved at presentation to ED. CT head negative for acute intracranial abnormality and we were unable to obtain MRI brain due to patient's pacemaker.  UA, UDS negative. Orthostatic vital signs were obtained and showed lying BP 114/65 HR 83, sitting BP 104/70 HR 94, standing BP 87/49 HR 92. On further questioning he admits poor PO intake over the last several days prior to admission. Folate and vitamin B12 were normal, and lipid panel revealed LDL 80. Hemoglobin was stable and he received a small 250 cc bolus in the ED. CTA head/neck showed no acute head CT finding; aortic atherosclerosis; soft plaque of the subclavian arteries and proximal common carotid arteries but without flow limiting stenosis or visible ulceration; calcified plaque in both internal carotid artery bulbs but without measurable stenosis; intracranial distal vessel atherosclerotic narrowing and irregularity of the anterior circulation and posterior circulation branches; none of these findings require intervention. ?  ?HFrEF ?BNP WNL. He was continued on home torsemide. ?  ?AKI on CKD 3a ?CMP on admission revealed AKI with serum creatinine of 2.11. Baseline creatinine around 1.7-1.8. He received a small 250 cc bolus in the ED. CTA head and neck was delayed to allow for improvement of AKI, and serum creatinine improved to 2.01 overnight. He was given gentle hydration prior to CTA head and neck prior to discharge showed no acute head CT finding; soft plaque of the subclavian arteries and proximal common carotid arteries without flow limiting stenosis or visible ulceration; calcified plaque in bilateral internal carotid artery bulbs without measurable stenosis; intracranial distal vessel atherosclerotic narrowing  and irregularity of the anterior circulation and posterior circulation branches; no lesions are correctable.  ? ?Hx A-fib/aflutter ?Hx 1st-degree AVB ?Hx MVR with mechanical valve ?Patient in A-fib throughout admission with rate control. PT/INR 30.2/2.9 on admission, 29.5/2.8 at time of discharge which is within INR goal of 2.5-3.5. Warfarin was continued per pharmacy dosing. ?  ?HTN ?Home antihypertensives were held given mild hypotension on arrival and positive orthostatic vital signs.  ?  ?Hx BPH ?Chronic condition. Continued home Flomax.  ? ?DISCHARGE INSTRUCTIONS:  ? ?Discharge Instructions   ? ? Ambulatory referral to Physical Therapy   Complete by: As directed ?  ? Call MD for:  difficulty breathing, headache or visual disturbances   Complete by: As directed ?  ? Call MD for:  persistant dizziness or light-headedness   Complete by: As directed ?  ? Diet - low sodium heart healthy   Complete by: As directed ?  ? Discharge instructions   Complete by: As directed ?  ? Mr. Semelsberger, ? ?It was a pleasure caring for you during your stay at [facility]. I am glad that you are feeling better and that we are able to see that you did not have a stroke. ? ?When you go home, you will have a  new medication called lipitor that you take once each day for cholesterol. I would like you to stop taking lisinopril until your PCP follow-up as well. The remainder of your medications are staying the same. ? ?Remember to stay hydrated and eat well! ? ?I wish you the best,  ?Dr. Marlou Sa  ? Increase activity slowly   Complete by: As directed ?  ? ?  ? ? ?SUBJECTIVE:  ?Patient evaluated at bedside on day of discharge. He says that he feels better than yesterday and has not had recurrence of dizziness or weakness. He admits that he hasn't had much PO intake since discharge and feels that this could have contributed to the dizziness he was feeling when he came in.  ? ?Discharge Vitals:   ?BP 101/62 (BP Location: Right Arm)   Pulse  83   Temp 97.9 ?F (36.6 ?C) (Oral)   Resp 18   Ht 5\' 8"  (1.727 m)   Wt 82.4 kg   SpO2 98%   BMI 27.62 kg/m?  ? ?OBJECTIVE:  ?Constitutional:Elderly gentleman resting in bed, in no acute distress. ?Cardio:Regu

## 2021-09-16 NOTE — Care Management Obs Status (Signed)
MEDICARE OBSERVATION STATUS NOTIFICATION ? ? ?Patient Details  ?Name: [name] ?MRN: [id number] ?Date of Birth: 1945-11-04 ? ? ?Medicare Observation Status Notification Given:  Yes ? ? ? ?Kermit Balo, RN ?09/16/2021, 2:23 PM ?

## 2021-09-16 NOTE — Progress Notes (Addendum)
Internal Medicine Attending:  ? ?I saw and examined the patient. I reviewed the resident?s H and P note and I agree with the resident?s findings and plan as documented in the resident?s note. ? ?In brief, patient is 76 year old male who is well-known to our service.  He has a past medical history of a flutter/A-fib, first-degree AV block, BPH, chronic systolic heart failure with an EF of 35 to 40%, CKD stage IIIa, hypertension, status post ICD placement, mechanical mitral valve who presented to the ED with an episode of lightheadedness and left arm numbness. ? ?This episode lasted approximately 30 minutes and then resolved spontaneously.  Patient is brought to the ED for further evaluation.  No chest pain, no shortness of breath, no palpitations, no syncope, no focal weakness, no tingling or numbness. ? ?On exam, patient is oriented x3 in no apparent distress.  Heart rate is irregularly irregular with normal rate, lungs are clear to auscultation bilaterally, trace bilateral lower extremity pitting edema noted on exam.  Abdomen is soft, nontender, nondistended, normoactive bowel sounds.  Neuro exam shows patient oriented x3, cranial nerves II to XII grossly intact, extraocular movements intact, power 5 out of 5 bilateral upper and lower extremities, sensation intact.  Patient does have mild drift of left upper extremity on exam. ? ?Patient was admitted to the hospital with possible TIA and was also noted to have AKI on CKD stage IIIa.  Patient has a baseline creatinine of 1.7-1.8 and presented with a creatinine of 2.11 on admission.  Patient has been compliant with his torsemide but has not been taking much p.o.  He does complain of some lightheadedness on standing and was noted to be orthostatic on admission.  I suspect that his episode of dizziness is secondary to his orthostasis.  We will hold diuretics for now and start the patient on IV fluids for likely prerenal AKI.  We will need to monitor closely given  patient was recently admitted for heart failure exacerbation. ? ?Neuro follow-up recommendations appreciated.  There was some concern given patient's left arm numbness and mild left upper extremity drift on exam that he may have had TIA/CVA.  However, he is unable to have an MRI secondary to his ICD being incompatible.  CT head with no acute pathology noted.  Patient will need a CT angio head and neck with contrast for further evaluation.  However, given patient's worsening renal function this was held off on yesterday.  Given patient's creatinine has improved mildly today and we have started him on IV fluids and held his diuretic we will attempt to get his CTA head/neck today.  Case was discussed with Dr.Xu at the bedside.  No further neuro work-up if this is negative. ? ?Patient is stable for DC home today if his neuro work-up is negative.  He will need follow-up in [facility] on Friday for repeat BMP and to ensure that he is euvolemic. ?

## 2021-09-16 NOTE — TOC Initial Note (Signed)
Transition of Care (TOC) - Initial/Assessment Note  ? ? ?Patient Details  ?Name: [name] ?MRN: [id number] ?Date of Birth: [birth year] ? ?Transition of Care (TOC) CM/SW Contact:    ?Pollie Friar, RN ?Phone Number: ?09/16/2021, 2:19 PM ? ?Clinical Narrative:                 ?Patient is from home with his daughter, ex-girlfriend and grandchildren. He states he has supervision at home.  ?Pt states his ex-girlfriend oversees his medications at home. She also does the driving.  ?Pt sees [facility] for his P[facility].  ?Pt was interested in going to outpatient therapy close to his home. Cm has put information on the AVS for [facility] and orders are in epic.  ?TOC following. ? ?Expected Discharge Plan: OP Rehab ?Barriers to Discharge: Continued Medical Work up ? ? ?Patient Goals and CMS Choice ?  ?  ?Choice offered to / list presented to : Patient ? ?Expected Discharge Plan and Services ?Expected Discharge Plan: OP Rehab ?  ?Discharge Planning Services: CM Consult ?  ?Living arrangements for the past 2 months: [place] ?                ?  ?  ?  ?  ?  ?  ?  ?  ?  ?  ? ?Prior Living Arrangements/Services ?Living arrangements for the past 2 months: [place] ?Lives with:: Adult Children (ex girlfriend) ?Patient language and need for interpreter reviewed:: Yes ?Do you feel safe going back to the place where you live?: Yes      ?  ?Care giver support system in place?: Yes (comment) ?  ?Criminal Activity/Legal Involvement Pertinent to Current Situation/Hospitalization: No - Comment as needed ? ?Activities of Daily Living ?Home Assistive Devices/Equipment: Cane (specify quad or straight) ?ADL Screening (condition at time of admission) ?Patient's cognitive ability adequate to safely complete daily activities?: Yes ?Is the patient deaf or have difficulty hearing?: No ?Does the patient have difficulty seeing, even when wearing glasses/contacts?: Yes ?Does the patient have difficulty  concentrating, remembering, or making decisions?: Yes ?Patient able to express need for assistance with ADLs?: Yes ?Does the patient have difficulty dressing or bathing?: Yes ?Independently performs ADLs?: Yes (appropriate for developmental age) ?Does the patient have difficulty walking or climbing stairs?: No ?Weakness of Legs: None ?Weakness of Arms/Hands: None ? ?Permission Sought/Granted ?  ?  ?   ?   ?   ?   ? ?Emotional Assessment ?Appearance:: Appears stated age ?Attitude/Demeanor/Rapport: Engaged ?Affect (typically observed): Accepting ?Orientation: : Oriented to Self, Oriented to Place, Oriented to Situation ?  ?Psych Involvement: No (comment) ? ?Admission diagnosis:  TIA (transient ischemic attack) [G45.9] ?Dizziness [R42] ?Dyspnea [R06.00] ?Patient Active Problem List  ? Diagnosis Date Noted  ? TIA (transient ischemic attack) 09/15/2021  ? Contact dermatitis 09/09/2021  ? Acute exacerbation of CHF (congestive heart failure) ([place]) 09/09/2021  ? GI bleed 06/27/2021  ? Inguinal hernia, right 04/21/2021  ? CKD stage G3a/A1, GFR 45-59 and albumin creatinine ratio <30 mg/g (HCC) 03/26/2016  ? Memory impairment 12/24/2015  ? H/O mitral valve replacement with mechanical valve 11/13/2015  ? Benign prostatic hyperplasia with nocturia 11/13/2015  ? Healthcare maintenance 11/13/2015  ? History of arteriovenous malformation (AVM) 03/28/2014  ? Chronic systolic heart failure ([place]) 06/02/2011  ? Essential hypertension 09/09/2008  ? History of atrial flutter 03/13/2008  ? ?PCP:  [name], MD ?Pharmacy:   ?[id number] [address] [place] [facility]  [address] ?[place] ?[place] [address] ?Phone: [phone] Fax: [phone] ? ?[facility] ?[address] ?[place] [address] ?Phone: [phone] Fax: [phone] ? ? ? ? ?Social Determinants of Health (SDOH) Interventions ?  ? ?Readmission Risk Interventions ?No flowsheet  data found. ? ? ?

## 2021-09-16 NOTE — Progress Notes (Signed)
Was notified by RT that given elevated creatinine, unable to get CT angio head and neck at this time. Will delay the CT Angio by 1 day with hope that Cr trends down. ? ?Erick Blinks ?[facility] ?Pager Number [phone] ?

## 2021-09-16 NOTE — Evaluation (Addendum)
I agree with the following treatment note after review of the documentation. This session was performed under the supervision of a licensed clinician.  ? ?Reuel Derby, PT, DPT  ?Acute Rehabilitation Services  ?Pager: [phone] ? ? ?Physical Therapy Evaluation ?Patient Details ?Name: [name] ?MRN: [id number] ?DOB: 1945/09/12 ?Today's Date: 09/16/2021 ? ?History of Present Illness ? Pt is a 76 yo M presnting to the ED 09/15/21 for dizziness, HA, and LUE numbness. Imaging negative. PMH includes A-fib, A-flutter, 1st degree AV block, pacemaker, HTN, mechanical mitral valve, CKD.  ?Clinical Impression ? Patient [facility] the [facility] with dizziness, HA, and LUE numbness. Pt's impairments include decreased strength, balance, and dual tasking with mobility tasks. These are limiting his ability to safely and independently transfer, ambulate, and navigate stairs. Patient requires supervision to min guard with transfers and min guard to min A for ambulation. Pt performed vertical head turns with one LOB requiring min A to recover. SPT recommending outpatient PT upon D/C to address balance deficits during transfers and ambulation. PT will continue to see acutely to maximize pt's independence with functional mobility. ?   ?   ? ?Recommendations for follow up therapy are one component of a multi-disciplinary discharge planning process, led by the attending physician.  Recommendations may be updated based on patient status, additional functional criteria and insurance authorization. ? ?Follow Up Recommendations Outpatient PT ? ?  ?Assistance Recommended at Discharge Intermittent Supervision/Assistance  ?Patient can return home with the following ? A little help with walking and/or transfers;Assistance with cooking/housework;Help with stairs or ramp for entrance ? ?  ?Equipment Recommendations None recommended by PT  ?Recommendations for Other Services ?    ?  ?Functional Status Assessment Patient has had a recent  decline in their functional status and demonstrates the ability to make significant improvements in function in a reasonable and predictable amount of time.  ? ?  ?Precautions / Restrictions Precautions ?Precautions: Fall ?Restrictions ?Weight Bearing Restrictions: No  ? ?  ? ?Mobility ? Bed Mobility ?  ?  ?  ?  ?  ?  ?  ?General bed mobility comments: sitting EOB upon entry ?  ? ?Transfers ?Overall transfer level: Needs assistance ?Equipment used: None ?Transfers: Sit to/from Stand ?Sit to Stand: Supervision, Min guard ?  ?  ?  ?  ?  ?General transfer comment: pt needed min guard A for steadying upon initial sit to stand. While performing repetetive sit to stands for therex, pt required supervision and cues to scoot bottom forward in chair. ?  ? ?Ambulation/Gait ?Ambulation/Gait assistance: Min guard, Min assist ?Gait Distance (Feet): 150 Feet ?Assistive device: None ?Gait Pattern/deviations: Step-through pattern, Decreased stride length, Narrow base of support ?Gait velocity: slightly decreased ?Gait velocity interpretation: >2.62 ft/sec, indicative of community ambulatory ?  ?General Gait Details: steady pace; LOB with DGI vertical head turn, specifically looking up, that required min A to steady. Pt performed a few DGI tasks with limited difficulty. ? ?Stairs ?  ?  ?  ?  ?  ? ?Wheelchair Mobility ?  ? ?Modified Rankin (Stroke Patients Only) ?  ? ?  ? ?Balance Overall balance assessment: Needs assistance ?Sitting-balance support: No upper extremity supported, Feet supported ?Sitting balance-Leahy Scale: Good ?  ?  ?Standing balance support: No upper extremity supported, During functional activity ?Standing balance-Leahy Scale: Fair ?Standing balance comment: required no UE support with static standing ?  ?  ?  ?  ?Rhomberg - Eyes Opened: 30 ?Rhomberg - Eyes  Closed: 30 (mild sway noted; pt self corrected) ?  ?  ?Standardized Balance Assessment ?Standardized Balance Assessment : Dynamic Gait Index ?  ?Dynamic Gait  Index ?Level Surface: Normal ?Change in Gait Speed: Normal ?Gait with Vertical Head Turns: Moderate Impairment ?Gait and Pivot Turn: Mild Impairment ?Step Around Obstacles: Normal ?   ? ? ? ?Pertinent Vitals/Pain Pain Assessment ?Pain Assessment: Faces ?Faces Pain Scale: No hurt  ? ? ?Home Living Family/patient expects to be discharged to:: Private residence ?Living Arrangements: Children;Non-relatives/Friends ?Available Help at Discharge: Family;Available 24 hours/day ?Type of Home: House ?Home Access: Stairs to enter ?Entrance Stairs-Rails: Right ?Entrance Stairs-Number of Steps: 5 ?  ?Home Layout: One level ?Home Equipment: Kasandra Knudsen - single point ?   ?  ?Prior Function Prior Level of Function : Independent/Modified Independent;Driving ?  ?  ?  ?  ?  ?  ?Mobility Comments: uses cane while walking and free hand to grab onto walls or furniture as needed. ?  ?  ? ? ?Hand Dominance  ?   ? ?  ?Extremity/Trunk Assessment  ? Upper Extremity Assessment ?Upper Extremity Assessment: Overall WFL for tasks assessed ?  ? ?Lower Extremity Assessment ?Lower Extremity Assessment: Overall WFL for tasks assessed ?  ? ?Cervical / Trunk Assessment ?Cervical / Trunk Assessment: Normal  ?Communication  ? Communication: No difficulties  ?Cognition Arousal/Alertness: Awake/alert ?Behavior During Therapy: [facility] for tasks assessed/performed ?Overall Cognitive Status: No family/caregiver present to determine baseline cognitive functioning ?  ?  ?  ?  ?  ?  ?  ?  ?  ?  ?  ?Memory: Decreased short-term memory (could not remember room number during ambulation) ?  ?  ?  ?  ?  ?  ?  ? ?  ?General Comments   ? ?  ?Exercises Total Joint Exercises ?Long Arc Quad: Both, Seated, 15 reps ?Other Exercises ?Other Exercises: seated marching (hip flexion): 10 reps, both  ? ?Assessment/Plan  ?  ?PT Assessment Patient needs continued PT services  ?PT Problem List Decreased strength;Decreased balance;Decreased mobility;Decreased safety awareness ? ?   ?  ?PT  Treatment Interventions Stair training;Gait training;DME instruction;Functional mobility training;Therapeutic activities;Therapeutic exercise;Balance training;Neuromuscular re-education;Patient/family education   ? ?PT Goals (Current goals can be found in the Care Plan section)  ?Acute Rehab PT Goals ?Patient Stated Goal: to go home and ride his bike down the street ?PT Goal Formulation: With patient ?Time For Goal Achievement: 09/16/21 ?Potential to Achieve Goals: Good ? ?  ?Frequency Min 2X/week ?  ? ? ?Co-evaluation   ?  ?  ?  ?  ? ? ?  ?AM-PAC PT "6 Clicks" Mobility  ?Outcome Measure Help needed turning from your back to your side while in a flat bed without using bedrails?: None ?Help needed moving from lying on your back to sitting on the side of a flat bed without using bedrails?: None ?Help needed moving to and from a bed to a chair (including a wheelchair)?: A Little ?Help needed standing up from a chair using your arms (e.g., wheelchair or bedside chair)?: A Little ?Help needed to walk in hospital room?: A Little ?Help needed climbing 3-5 steps with a railing? : A Lot ?6 Click Score: 19 ? ?  ?End of Session Equipment Utilized During Treatment: Gait belt ?Activity Tolerance: Patient tolerated treatment well ?Patient left: in chair;with chair alarm set;with call bell/phone within reach;with nursing/sitter in room ?Nurse Communication: Mobility status ?PT Visit Diagnosis: Unsteadiness on feet (R26.81);History of falling (Z91.81) ?  ? ?  Time: [id number] ?PT Time Calculation (min) (ACUTE ONLY): 19 min ? ? ?Charges:   PT Evaluation ?$PT Eval Low Complexity: 1 Low ?  ?  ?   ? ? ?Jonne Ply, SPT ? ?Jonne Ply ?09/16/2021, 12:31 PM ? ?

## 2021-09-16 NOTE — Hospital Course (Addendum)
Possible TIA ?Positive orthostatic vital signs ?Patient presented via EMS with dizziness and L arm numbness. This episode lasted for about 30 minutes and had resolved at presentation to ED. CT head negative for acute intracranial abnormality and we were unable to obtain MRI brain due to patient's pacemaker.  UA, UDS negative. Orthostatic vital signs were obtained and showed lying BP 114/65 HR 83, sitting BP 104/70 HR 94, standing BP 87/49 HR 92. On further questioning he admits poor PO intake over the last several days prior to admission. Folate and vitamin B12 were normal, and lipid panel revealed LDL 80. Hemoglobin was stable and he received a small 250 cc bolus in the ED. CTA head/neck showed no acute head CT finding; aortic atherosclerosis; soft plaque of the subclavian arteries and proximal common carotid arteries but without flow limiting stenosis or visible ulceration; calcified plaque in both internal carotid artery bulbs but without measurable stenosis; intracranial distal vessel atherosclerotic narrowing and irregularity of the anterior circulation and posterior circulation branches; none of these findings require intervention. ?  ?HFrEF ?BNP WNL. He was continued on home torsemide. ?  ?AKI on CKD 3a ?CMP on admission revealed AKI with serum creatinine of 2.11. Baseline creatinine around 1.7-1.8. He received a small 250 cc bolus in the ED. CTA head and neck was delayed to allow for improvement of AKI, and serum creatinine improved to 2.01 overnight. He was given gentle hydration prior to CTA head and neck prior to discharge showed no acute head CT finding; soft plaque of the subclavian arteries and proximal common carotid arteries without flow limiting stenosis or visible ulceration; calcified plaque in bilateral internal carotid artery bulbs without measurable stenosis; intracranial distal vessel atherosclerotic narrowing and irregularity of the anterior circulation and posterior circulation branches; no  lesions are correctable.  ? ?Hx A-fib/aflutter ?Hx 1st-degree AVB ?Hx MVR with mechanical valve ?Patient in A-fib throughout admission with rate control. PT/INR 30.2/2.9 on admission, 29.5/2.8 at time of discharge which is within INR goal of 2.5-3.5. Warfarin was continued per pharmacy dosing. ?  ?HTN ?Home antihypertensives were held given mild hypotension on arrival and positive orthostatic vital signs.  ?  ?Hx BPH ?Chronic condition. Continued home Flomax. ?

## 2021-09-17 ENCOUNTER — Other Ambulatory Visit (HOSPITAL_COMMUNITY): Payer: Self-pay

## 2021-09-17 ENCOUNTER — Telehealth (HOSPITAL_COMMUNITY): Payer: Self-pay | Admitting: Pharmacist

## 2021-09-17 NOTE — Telephone Encounter (Signed)
Pharmacy Transitions of Care Follow-up Telephone Call ? ?Date of discharge: 09/11/21  ?Discharge Diagnosis: TIA and CHF ? ?Spoke with patient's friend and caregiver, [name], who cares for medications.  ? ?Medication changes made at discharge: ? - START: Atorvastatin, enoxaparin, warfarin ? - STOPPED: lisinopril ? - CHANGED: n/a ? ?Medication changes verified by the patient? Yes ?  ? ?Medication Accessibility: ? ?[facility]  ? ?Was the patient provided with refills on discharged medications? yes  ? ?Have all prescriptions been transferred from [facility] to home pharmacy? N/a, [facility]  ? ?Is the patient able to afford medications? PartD and medicaid ?Notable copays: all generic ?Eligible patient assistance: n/a ?  ? ?Medication Review: ? ?WARFARIN ?- Confirmed dose ?- denies bleeding/bruising ?- aware of upcoming appointment for INR check ? ?ENOXAPARIN ?- Verified dosing and anticipated length of therapy.  ?-Pt is giving this appropriately.   ?- Experiencing mild bruising at injection site ? ?Follow-up Appointments: ? ?PCP Hospital f/u appt confirmed? Scheduled to see Dr. Collene Gobble at Internal Medicine clinic on 09/18/21.  ? ?[facility] f/u appt confirmed?  Scheduled for remote pacer checks but no office visits at this time  ? ?If their condition worsens, is the pt aware to call PCP or go to the Emergency Dept.? yes ? ?Final Patient Assessment: ?Patient is doing well and taking enoxaparin and warfarin appropriately.  Has also started atorvastatin.  Reports compliance and denies any bleeding issues.  Already has refills at home pharmacy.  Is aware of follow-up tomorrow with Internal Med clinic.  ?

## 2021-09-17 NOTE — Telephone Encounter (Signed)
Transitions of Care Pharmacy  ? ?Call attempted for a pharmacy transitions of care follow-up. HIPAA appropriate voicemail was left with call back information provided.  Left message on [name] phone number as this was the preferred number.  ? ?Call attempt #1. Will follow-up in 2-3 days.  ? ? ?

## 2021-09-18 ENCOUNTER — Encounter: Payer: Self-pay | Admitting: Internal Medicine

## 2021-09-18 ENCOUNTER — Other Ambulatory Visit: Payer: Self-pay | Admitting: Internal Medicine

## 2021-09-18 ENCOUNTER — Encounter: Payer: 59 | Admitting: Student

## 2021-09-23 ENCOUNTER — Encounter: Payer: Self-pay | Admitting: Internal Medicine

## 2021-09-23 ENCOUNTER — Telehealth: Payer: Self-pay | Admitting: Pharmacist

## 2021-09-23 ENCOUNTER — Telehealth: Payer: Self-pay | Admitting: *Deleted

## 2021-09-23 ENCOUNTER — Other Ambulatory Visit: Payer: Self-pay

## 2021-09-23 ENCOUNTER — Ambulatory Visit (INDEPENDENT_AMBULATORY_CARE_PROVIDER_SITE_OTHER): Payer: 59 | Admitting: Internal Medicine

## 2021-09-23 VITALS — BP 121/62 | HR 87 | Temp 98.3°F | Resp 24 | Ht 68.0 in | Wt 185.6 lb

## 2021-09-23 DIAGNOSIS — N1831 Chronic kidney disease, stage 3a: Secondary | ICD-10-CM

## 2021-09-23 DIAGNOSIS — Z7901 Long term (current) use of anticoagulants: Secondary | ICD-10-CM

## 2021-09-23 DIAGNOSIS — G459 Transient cerebral ischemic attack, unspecified: Secondary | ICD-10-CM

## 2021-09-23 DIAGNOSIS — Z952 Presence of prosthetic heart valve: Secondary | ICD-10-CM

## 2021-09-23 DIAGNOSIS — I5022 Chronic systolic (congestive) heart failure: Secondary | ICD-10-CM | POA: Diagnosis not present

## 2021-09-23 DIAGNOSIS — R7303 Prediabetes: Secondary | ICD-10-CM

## 2021-09-23 DIAGNOSIS — N1832 Chronic kidney disease, stage 3b: Secondary | ICD-10-CM

## 2021-09-23 DIAGNOSIS — I130 Hypertensive heart and chronic kidney disease with heart failure and stage 1 through stage 4 chronic kidney disease, or unspecified chronic kidney disease: Secondary | ICD-10-CM

## 2021-09-23 DIAGNOSIS — R351 Nocturia: Secondary | ICD-10-CM

## 2021-09-23 DIAGNOSIS — I10 Essential (primary) hypertension: Secondary | ICD-10-CM

## 2021-09-23 DIAGNOSIS — N401 Enlarged prostate with lower urinary tract symptoms: Secondary | ICD-10-CM

## 2021-09-23 LAB — PROTIME-INR
INR: 4.5 (ref 0.8–1.2)
Prothrombin Time: 43 seconds — ABNORMAL HIGH (ref 11.4–15.2)

## 2021-09-23 LAB — POCT INR: INR: 5 — AB (ref 2.0–3.0)

## 2021-09-23 NOTE — Telephone Encounter (Signed)
As we just discussed--and as you provided the information to the care-giver: OMIT today's dose of warfarin. Patient is to drink 1/2 bottle of Lipton's Green Tea (180 mcg of vitamin K1 per total bottle, patient will consume approximately 90 mcg of vitamin K1). Patient will take one-half (1/2) of his 5mg  peach-colored warfarin tablet on Wednesday 22-MAR-23  ?

## 2021-09-23 NOTE — Assessment & Plan Note (Addendum)
Patient was recently admitted due to symptoms concerning to TIA. His friend called EMS because he began to complain of left arm numbness and dizziness. Symptoms had resolved by time of arrival to ED. Patient does not remember episode. Unable to obtain MRI due to patient's pacemaker. Neuro consulted with no further recommendations. CTA head/neck showed no acute head findings; plaque present in subclavian, common carotid, and internal carotid arteries. LDL mildly elevated at 80 for secondary prevention. He was started on lipitor following discharge.  He does not have lipitor medication with him in medication bag and medication list is from outdated AVS. He is independent in most ADLs, but requires help with taking medications from his friend [name]. Orthostatics checked in clinic were negative. ?P: ?-Continue lipitor 20 mg, called and spoke with friend. She thinks that medication is at home, but she is not there currently. ?Recheck lipid panel at follow-up visit 8 weeks ?-Complete MoCA at follow-up ?-Consider pre-packaged medication  ?

## 2021-09-23 NOTE — Assessment & Plan Note (Addendum)
Patient was subtherapeutic during hospital admission 3/7-3/9. INR goal of 2.5-3.5. He was sent home with lovenox bridging and warfarin dosage was adjusted. Patient is unable to tell what dosage of warfarin he has been taking. His friend, [name], helps with his medications. She is not at visit today. ?A/P: ?INR was supratheraputic at 5.0 in clinic. Patient denies bleeding over the last week. Dr. Elie Confer recommended having patient drink [employer] Tea that contains Vitamin K, this was provided to patient while in clinic and he drank about 1/2 of it. I called and spoke with [name]. Instructed her to hold warfarin dose today, to give 2.5 mg (1/2 tab) on Wednesday and Thursday, then to have patient come back to clinic on Friday. She endorsed understanding. Unable to reach patient to verify his understanding. Messaged front desk staff for help with scheduling patient's follow-up on Friday. ?

## 2021-09-23 NOTE — Assessment & Plan Note (Signed)
Hgb A1c 5.7 03/23 ?-recheck in 3 months ?

## 2021-09-23 NOTE — Assessment & Plan Note (Signed)
Lisinopril 2.5 mg held due to hypotension. Blood pressure normotensive. Orthostatics negative. ?P: ?Continue holding lisinopril ?

## 2021-09-23 NOTE — Assessment & Plan Note (Addendum)
Patient presenting with history for CKD stage 3a.  Patient appears euvolemic on exam. His weight is 4 lbs up from weight on discharge 03/09. His creatinine/ GFR have worsened over the last 4-5 months. He is now consistent with stage 3b.  ?P: ?Recheck BMP today, if worsening may consider decreasing dosage of diuretic.  ?Referral to nephrology. ? ?Addendum 09/24/21: ?Renal function worsening with creatinine 2.01 to 2.26, GFR at 29. Called and talked with Patient and friend [name] to decrease torsemide dosing to once daily for today and tomorrow. They will be following up in clinic Friday. Renal function has quickly worsened since January.  ?-UA and Albumin to creatinine ratio at follow-up ?-bladder scan ?

## 2021-09-23 NOTE — Assessment & Plan Note (Signed)
Medications: Tamsulosin 0.8 qd ?PSA wnl. He states that he gets up 2 times per night to urinate. This is an improvement from before. He denies difficulty emptying bladder, straining to empty bladder, or incontinence. ?A/P: ?Continue tamsulosin 0.8 mg qd ?

## 2021-09-23 NOTE — Patient Instructions (Signed)
[name], it was a pleasure seeing you today! ? ?Today we discussed: ?Weak heart ?Please continue taking coreg 6.25 mg, 1 time in the morning and 1 time in the evening. Continue taking torsemide 20 mg 1 time in the morning and 1 time in the evening. Your weight looks better which means the fluid is staying off. ? ?Kidney function ?I am rechecking some blood work today. Your kidney function is getting worse so I am going to refer you to the kidney specialist to help. Keep holding lisinopril. ? ?Cholesterol ?Please start taking Lipitor once daily. This will help lower cholesterol. Follow-up in 8 weeks and we will recheck cholesterol at that time. ? ?Peeing at night ?Please keep taking 2 tabs of tamsulosin nightly. If your symptoms become bothersome again, please let use know. ? ?Warfarin ?I am rechecking your bloodwork. I will reach out to Dr. [name] for help with warfarin dosage. ? ?I have ordered the following labs today: ? ? ?Lab Orders    ?     BMP8+Anion Gap    ?     POCT INR     ? ?I will call if any are abnormal. All of your labs can be accessed through "My Chart" ?  ?My Chart Access: ?[email or web address] ? ? ?Referrals ordered today:  ? ? ?Referral Orders    ?     Ambulatory referral to Nephrology     ? ?I have ordered the following medication/changed the following medications:  ? ?Stop the following medications: ?There are no discontinued medications.  ? ?Start the following medications: ?No orders of the defined types were placed in this encounter. ?  ? ?Follow-up:  8 weeks   ? ?Please make sure to arrive 15 minutes prior to your next appointment. If you arrive late, you may be asked to reschedule.  ? ?We look forward to seeing you next time. Please call our clinic at [phone] if you have any questions or concerns. The best time to call is Monday-Friday from 9am-4pm, but there is someone available 24/7. If after hours or the weekend, call the main  hospital number and ask for the Internal Medicine Resident On-Call. If you need medication refills, please notify your pharmacy one week in advance and they will send [place] a request. ? ?Thank you for letting us take part in your care. Wishing you the best! ? ?Thank you, ?Dr. Sloan Leiter ?[facility]  ?

## 2021-09-23 NOTE — Assessment & Plan Note (Signed)
Medications: coreg 6.25 mg BID, torsemide 20 mg BID ?Weight is 181 to 185 lbs, about down from 15 lbs from prior OV. He states that his swelling in his legs is better and his weakness has improved. He denies positional dyspnea. Echo was repeated and unchangd from 01/21 with EF of 35-40% , moderately decreased function and regional wall motion abnormalities, severe dyskinesis of LV wall. Patient appears euvolemic on exam. ?A/P: ?Continue Coreg and torsemide at same dosing. Patient seems unsure of dosing of medications. He was ok with me calling and talking with his friend [name] who helps with his medications. ?BMP ?

## 2021-09-23 NOTE — Progress Notes (Addendum)
? ? ?Subjective:  ?CC: hospital follow-up for acute on chronic heart failure 3/7-3/9 and transient ischemic attack 3/13-3/14 ? ?HPI: ? ?[name] is a 76 y.o. male with PMH of atrial flutter/fibrillation, 1st degree AVB, BPH, HFrEF (35-40%), CKD IIIa, HTN, ICD placement, mechanical mitral valve who presents to [facility] for hospital follow-up after 2 separate admissions for acute on chronic heart failure and TIA. Please see problem based assessment and plan for additional details.  ? ?Past Medical History:  ?Diagnosis Date  ? Acute GI bleeding 06/27/2021  ? Anemia 01/29/2016  ? Atrial fibrillation ([place])   ? post op.  s/p dc-cv 04/2008. previously on amiodarone  ? Atrial flutter ([place])   ? atypical  ? AV block, 1st degree   ? .450 msec--progressive  ? 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  a- Echo 07/2008 Ef 55% mild peroprosthetic MVR with High transmitral gradient (mean 14).  b- normal coronaries by cath 12/2007  ? BPH (benign prostatic hyperplasia)   ? Cardiac resynchronization therapy pacemaker (CRT-P) in place 06/22/2011  ? CHF (congestive heart failure) ([place])   ? EF 35-40 % 2011 due to valvular disease and diastolic dysfunction  ? Chronic back pain   ? CKD (chronic kidney disease), stage III ([place])   ? COVID-19 virus infection 07/24/2020  ? Tested positive on July 16, 2020  ? Dysphagia   ? with normal barium swallow 09/2008  ? Endocarditis   ? GERD (gastroesophageal reflux disease)   ? Heavy alcohol use   ? history  ? History of atrial flutter 03/13/2008  ? History of atrial flutter - resolved.   ? History of GI bleed   ? Hypertension   ? IV drug abuse ([place])   ? history of  ? Lower GI bleed 01/2016  ? Memory impairment 12/24/2015  ? Pacemaker 2010  ? ? ?Current Outpatient Medications on File Prior to Visit  ?Medication Sig Dispense Refill  ? acetaminophen (TYLENOL) 500 MG tablet Take 1,000 mg by mouth every 6 (six) hours as needed (pain).    ? atorvastatin (LIPITOR) 20 MG  tablet Take 1 tablet (20 mg total) by mouth every evening. 30 tablet 2  ? carvedilol (COREG) 6.25 MG tablet Take 1 tablet (6.25 mg total) by mouth 2 (two) times daily with a meal. 180 tablet 3  ? diclofenac Sodium (VOLTAREN) 1 % GEL APPLY 4 G TOPICALLY 4 TIMES DAILY (Patient not taking: Reported on 06/27/2021) 400 g 11  ? pantoprazole (PROTONIX) 40 MG tablet TAKE 1 TABLET (40 MG TOTAL) BY MOUTH TWICE A DAY BEFORE MEALS (Patient taking differently: Take 40 mg by mouth 2 (two) times daily before a meal.) 60 tablet 0  ? tamsulosin (FLOMAX) 0.4 MG CAPS capsule Take 2 capsules (0.8 mg total) by mouth daily after breakfast. 180 capsule 3  ? torsemide (DEMADEX) 20 MG tablet Take 1 tablet (20 mg total) by mouth 2 (two) times daily. 90 tablet 1  ? warfarin (COUMADIN) 5 MG tablet Take  1/2 tablet (2.5 mg) tablet by mouth on Tuesdays, Wednesdays, and Fridays.Then take whole tablet (5mg ) rest of days (Patient taking differently: Take 2.5-5 mg by mouth See admin instructions. Take 1 tablet (5 mg) on (Sun, Thurs, Sat) & Take 1/2 tablet (2.5 mg) all other days) 30 tablet 1  ? ?No current facility-administered medications on file prior to visit.  ? ? ?Family History  ?Problem Relation Age of Onset  ? Coronary artery disease Mother   ?  Cancer Father   ?     GI  ? Cancer Brother   ?     Lung  ? ? ?Social History  ? ?Socioeconomic History  ? Marital status: Divorced  ?  Spouse name: Not on file  ? Number of children: 9  ? Years of education: Not on file  ? Highest education level: Not on file  ?Occupational History  ? Occupation: retired  ?  Comment: [employer]  ?Tobacco Use  ? Smoking status: Former  ?  Years: 0.00  ?  Types: Cigarettes  ? Smokeless tobacco: Never  ?Vaping Use  ? Vaping Use: Never used  ?Substance and Sexual Activity  ? Alcohol use: No  ?  Alcohol/week: 0.0 standard drinks  ? Drug use: No  ? Sexual activity: Not Currently  ?Other Topics Concern  ? Not on file  ?Social History Narrative  ? Current Social History  02/16/2019    ?   ? Patient lives with family (baby's 47, daughter and 4 grandkids:  22 yo twins, 13 yo and 80 yo in a home which is 1 story. There are 4 steps with handrails up to the entrance the patient uses.   ?   ? Patient's method of transportation is via family member (baby's mama).  ?   ? The highest level of education was high school diploma.  ?   ? The patient currently retired.  ?   ? Identified important Relationships are "My 60 Joselyn Arrow, [name]."   ?   ? Pets : None  ?    ? Interests / Fun: Sitting outdoors watching the birds and planes."   ?   ? Exercise riding a bike 5 miles 3 days/week  ?   ? Current Stressors: The grandkids   ?   ? Religious / Personal Beliefs: "I believe in God."   ?   ? L. Ducatte, RN, BSN   ?   ? ?Social Determinants of Health  ? ?Financial Resource Strain: Not on file  ?Food Insecurity: Not on file  ?Transportation Needs: Not on file  ?Physical Activity: Not on file  ?Stress: Not on file  ?Social Connections: Not on file  ?Intimate Partner Violence: Not on file  ? ? ?Review of Systems: ?ROS negative except for what is noted on the assessment and plan. ? ?Objective:  ? ?Vitals:  ? 09/23/21 1111  ?BP: 121/62  ?Pulse: 87  ?Resp: (!) 24  ?Temp: 98.3 ?F (36.8 ?C)  ?TempSrc: Oral  ?SpO2: 100%  ?Weight: 185 lb 9.6 oz (84.2 kg)  ?Height: 5\' 8"  (1.727 m)  ? ? ?Physical Exam: ?Gen: Alert and oriented x 1, reported year as 2003 and current president [name]  ?Neck: no masses or nodules, AROM intact. ?CV: RRR, no murmurs, S1/S2 presents  ?Resp: Clear to auscultation bilaterally  ?Abd: BS (+) x4, soft, non-tender abdomen, without hepatosplenomegaly or masses ?MSK: Grossly normal AROM and strength x4 extremities. ?Skin: good skin turgor, no rashes, unusual bruising, or prominent lesions.  ?Neuro: No focal deficits, grossly normal sensation and coordination.  ?Psych: Oriented x1, he is unsure of medication regimen, seems withdrawn, PHQ-9 at 0  ? ? ?Assessment & Plan:  ?TIA (transient  ischemic attack) ?Patient was recently admitted due to symptoms concerning to TIA. His friend called EMS because he began to complain of left arm numbness and dizziness. Symptoms had resolved by time of arrival to ED. Patient does not remember episode. Unable to obtain MRI due to patient's  pacemaker. Neuro consulted with no further recommendations. CTA head/neck showed no acute head findings; plaque present in subclavian, common carotid, and internal carotid arteries. LDL mildly elevated at 80 for secondary prevention. He was started on lipitor following discharge.  He does not have lipitor medication with him in medication bag and medication list is from outdated AVS. He is independent in most ADLs, but requires help with taking medications from his friend [name]. Orthostatics checked in clinic were negative. ?P: ?-Continue lipitor 20 mg, called and spoke with friend. She thinks that medication is at home, but she is not there currently. ?Recheck lipid panel at follow-up visit 8 weeks ?-Complete MoCA at follow-up ?-Consider pre-packaged medication  ? ?Pre-diabetes ?Hgb A1c 5.7 03/23 ?-recheck in 3 months ? ?Benign prostatic hyperplasia with nocturia ?Medications: Tamsulosin 0.8 qd ?PSA wnl. He states that he gets up 2 times per night to urinate. This is an improvement from before. He denies difficulty emptying bladder, straining to empty bladder, or incontinence. ?A/P: ?Continue tamsulosin 0.8 mg qd ? ?H/O mitral valve replacement with mechanical valve ?Patient was subtherapeutic during hospital admission 3/7-3/9. INR goal of 2.5-3.5. He was sent home with lovenox bridging and warfarin dosage was adjusted. Patient is unable to tell what dosage of warfarin he has been taking. His friend, [name], helps with his medications. She is not at visit today. ?A/P: ?INR was supratheraputic at 5.0 in clinic. Patient denies bleeding over the last week. Dr. Elie Confer recommended having patient drink [employer] Tea that contains Vitamin K,  this was provided to patient while in clinic and he drank about 1/2 of it. I called and spoke with [name]. Instructed her to hold warfarin dose today, to give 2.5 mg (1/2 tab) on Wednesday and Thursday, then to have pati

## 2021-09-23 NOTE — Telephone Encounter (Addendum)
POC INR 5.0 (Result called to Dr Elie Confer)  ? ?Per patient no blood in urine or stool ?No recent medication changes ?No bleeding from gums or nose ?Discharged from hospital 09/11/21  and 09/16/21 ?Not sure if he took warfarin today, but will not take anymore today ?Thinks he took last Lovenox dose about 5 days ago (some bruising around injection sites) ?Not currently taking an antibiotic ? ?Plan:  ?Pt will hold warfarin for today ?Pt also given 1/2 bottle (about 10ozs) of Hansel Feinstein Tea  ?INR sent to lab for confirmation ?Dr Elie Confer will contact pt with further instructions ? ?(Pt also had lab by venipuncture which resulted in 4.5 INR) ?

## 2021-09-24 ENCOUNTER — Other Ambulatory Visit: Payer: Self-pay | Admitting: Internal Medicine

## 2021-09-24 DIAGNOSIS — I5022 Chronic systolic (congestive) heart failure: Secondary | ICD-10-CM

## 2021-09-24 LAB — BMP8+ANION GAP
Anion Gap: 19 mmol/L — ABNORMAL HIGH (ref 10.0–18.0)
BUN/Creatinine Ratio: 17 (ref 10–24)
BUN: 39 mg/dL — ABNORMAL HIGH (ref 8–27)
CO2: 26 mmol/L (ref 20–29)
Calcium: 9.6 mg/dL (ref 8.6–10.2)
Chloride: 94 mmol/L — ABNORMAL LOW (ref 96–106)
Creatinine, Ser: 2.26 mg/dL — ABNORMAL HIGH (ref 0.76–1.27)
Glucose: 98 mg/dL (ref 70–99)
Potassium: 3.7 mmol/L (ref 3.5–5.2)
Sodium: 139 mmol/L (ref 134–144)
eGFR: 29 mL/min/{1.73_m2} — ABNORMAL LOW (ref >59)

## 2021-09-24 NOTE — Progress Notes (Signed)
Internal Medicine Clinic Attending ? ?Case discussed with Dr. Sloan Leiter  At the time of the visit.  We reviewed the resident?s history and exam and pertinent patient test results.  I agree with the assessment, diagnosis, and plan of care documented in the resident?s note. Will closely need to monitor renal function and volume status. Ideally, would resume ACEi to optimize GDMT but will hold pending f/u labs and repeat BP later this week. Will need ACR and UA at f/u visit. Given complexity of medical regimen and concern for possible adherence issues, we will need to continue to work on a system for organizing med administration. ? ?

## 2021-09-24 NOTE — Addendum Note (Signed)
Addended by: Dickie La on: 09/24/2021 11:26 AM ? ? Modules accepted: Level of Service ? ?

## 2021-09-26 ENCOUNTER — Encounter: Payer: 59 | Admitting: Internal Medicine

## 2021-09-29 ENCOUNTER — Encounter: Payer: 59 | Admitting: Internal Medicine

## 2021-09-29 ENCOUNTER — Ambulatory Visit: Payer: 59

## 2021-09-29 ENCOUNTER — Telehealth: Payer: Self-pay | Admitting: Internal Medicine

## 2021-09-29 NOTE — Telephone Encounter (Signed)
Called and spoke with [name]. She states that she will have transportation tomorrow morning and will arrange care for her grandchild so that she can come with [name] to appointment. [name] asked to bring Mr. Steve to appointment at 10:15 3/28.  ?

## 2021-09-29 NOTE — Telephone Encounter (Signed)
Called and spoke with [name] at [employer]. Talked with her about lab findings last week and need for patient to come back in to have bloodwork rechecked. She states that she would be able to drive him to the hospital today, but could not attend appointment. Spoke with front desk staff to have patient put on schedule for this morning. ?

## 2021-09-30 ENCOUNTER — Ambulatory Visit (INDEPENDENT_AMBULATORY_CARE_PROVIDER_SITE_OTHER): Payer: 59 | Admitting: Internal Medicine

## 2021-09-30 ENCOUNTER — Encounter: Payer: Self-pay | Admitting: Internal Medicine

## 2021-09-30 VITALS — BP 119/55 | HR 81 | Temp 97.8°F | Ht 68.0 in | Wt 197.1 lb

## 2021-09-30 DIAGNOSIS — Z952 Presence of prosthetic heart valve: Secondary | ICD-10-CM | POA: Diagnosis not present

## 2021-09-30 DIAGNOSIS — I5022 Chronic systolic (congestive) heart failure: Secondary | ICD-10-CM | POA: Diagnosis not present

## 2021-09-30 DIAGNOSIS — R4189 Other symptoms and signs involving cognitive functions and awareness: Secondary | ICD-10-CM

## 2021-09-30 DIAGNOSIS — N1832 Chronic kidney disease, stage 3b: Secondary | ICD-10-CM | POA: Diagnosis not present

## 2021-09-30 LAB — POCT INR: INR: 4.5 — AB (ref 2.0–3.0)

## 2021-09-30 NOTE — Progress Notes (Addendum)
? ? ? ? ?Subjective:  ?CC: follow-up for heart failure with reduced EF and worsening kidney function ? ?HPI: ? ?[name] is a 75 year old person with PMH of HFrEF (35-40% 09/10/21), history of mechanical mitral valve replacement on chronic anticoagulation, CKD stage 3, and history of atrial flutter who presents for follow-up on therapeutic warfarin dosing and kidney function. He has been admitted to hospital 3 times in the last 4 months for GI bleed, heart failure exacerbation, and TIA. [name] appears to have cognitive impairment with MoCA score of 13 of 30 today.  His friend, [name], came with him to the appointment.  She gives him his medications. Please see problem based assessment and plan for additional details. ? ? ?Past Medical History:  ?Diagnosis Date  ? Acute GI bleeding 06/27/2021  ? Anemia 01/29/2016  ? Atrial fibrillation ([place])   ? post op.  s/p dc-cv 04/2008. previously on amiodarone  ? Atrial flutter ([place])   ? atypical  ? AV block, 1st degree   ? .450 msec--progressive  ? 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  a- Echo 07/2008 Ef 55% mild peroprosthetic MVR with High transmitral gradient (mean 14).  b- normal coronaries by cath 12/2007  ? BPH (benign prostatic hyperplasia)   ? Cardiac resynchronization therapy pacemaker (CRT-P) in place 06/22/2011  ? CHF (congestive heart failure) ([place])   ? EF 35-40 % 2011 due to valvular disease and diastolic dysfunction  ? Chronic back pain   ? CKD (chronic kidney disease), stage III ([place])   ? COVID-19 virus infection 07/24/2020  ? Tested positive on July 16, 2020  ? Dysphagia   ? with normal barium swallow 09/2008  ? Endocarditis   ? GERD (gastroesophageal reflux disease)   ? Heavy alcohol use   ? history  ? History of atrial flutter 03/13/2008  ? History of atrial flutter - resolved.   ? History of GI bleed   ? Hypertension   ? IV drug abuse ([place])   ? history of  ? Lower GI bleed 01/2016  ? Memory impairment 12/24/2015   ? Pacemaker 2010  ? ? ?Current Outpatient Medications on File Prior to Visit  ?Medication Sig Dispense Refill  ? acetaminophen (TYLENOL) 500 MG tablet Take 1,000 mg by mouth every 6 (six) hours as needed (pain).    ? atorvastatin (LIPITOR) 20 MG tablet Take 1 tablet (20 mg total) by mouth every evening. 30 tablet 2  ? carvedilol (COREG) 6.25 MG tablet Take 1 tablet (6.25 mg total) by mouth 2 (two) times daily with a meal. 180 tablet 3  ? diclofenac Sodium (VOLTAREN) 1 % GEL APPLY 4 G TOPICALLY 4 TIMES DAILY (Patient not taking: Reported on 06/27/2021) 400 g 11  ? pantoprazole (PROTONIX) 40 MG tablet TAKE 1 TABLET (40 MG TOTAL) BY MOUTH TWICE A DAY BEFORE MEALS (Patient taking differently: Take 40 mg by mouth 2 (two) times daily before a meal.) 60 tablet 0  ? tamsulosin (FLOMAX) 0.4 MG CAPS capsule Take 2 capsules (0.8 mg total) by mouth daily after breakfast. 180 capsule 3  ? torsemide (DEMADEX) 20 MG tablet TAKE 1 TABLET BY MOUTH TWICE A DAY 180 tablet 0  ? warfarin (COUMADIN) 5 MG tablet Take  1/2 tablet (2.5 mg) tablet by mouth on Tuesdays, Wednesdays, and Fridays.Then take whole tablet (5mg ) rest of days (Patient taking differently: Take 2.5-5 mg by mouth See admin instructions. Take 1 tablet (5 mg)  on (Sun, Thurs, Sat) & Take 1/2 tablet (2.5 mg) all other days) 30 tablet 1  ? ?No current facility-administered medications on file prior to visit.  ? ? ?Family History  ?Problem Relation Age of Onset  ? Coronary artery disease Mother   ? Cancer Father   ?     GI  ? Cancer Brother   ?     Lung  ? ? ?Social History  ? ?Socioeconomic History  ? Marital status: Divorced  ?  Spouse name: Not on file  ? Number of children: 9  ? Years of education: Not on file  ? Highest education level: Not on file  ?Occupational History  ? Occupation: retired  ?  Comment: [employer]  ?Tobacco Use  ? Smoking status: Former  ?  Years: 0.00  ?  Types: Cigarettes  ? Smokeless tobacco: Never  ?Vaping Use  ? Vaping Use: Never used   ?Substance and Sexual Activity  ? Alcohol use: No  ?  Alcohol/week: 0.0 standard drinks  ? Drug use: No  ? Sexual activity: Not Currently  ?Other Topics Concern  ? Not on file  ?Social History Narrative  ? Current Social History 02/16/2019    ?   ? Patient lives with family (baby's 76, daughter and 4 grandkids:  54 yo twins, 53 yo and 69 yo in a home which is 1 story. There are 4 steps with handrails up to the entrance the patient uses.   ?   ? Patient's method of transportation is via family member (baby's mama).  ?   ? The highest level of education was high school diploma.  ?   ? The patient currently retired.  ?   ? Identified important Relationships are "My 32 Joselyn Arrow, [name]."   ?   ? Pets : None  ?    ? Interests / Fun: Sitting outdoors watching the birds and planes."   ?   ? Exercise riding a bike 5 miles 3 days/week  ?   ? Current Stressors: The grandkids   ?   ? Religious / Personal Beliefs: "I believe in God."   ?   ? L. Ducatte, RN, BSN   ?   ? ?Social Determinants of Health  ? ?Financial Resource Strain: Not on file  ?Food Insecurity: Not on file  ?Transportation Needs: Not on file  ?Physical Activity: Not on file  ?Stress: Not on file  ?Social Connections: Not on file  ?Intimate Partner Violence: Not on file  ? ? ?Review of Systems: ?ROS negative except for what is noted on the assessment and plan. ? ?Objective:  ? ?Vitals:  ? 09/30/21 1058  ?BP: (!) 119/55  ?Pulse: 81  ?Temp: 97.8 ?F (36.6 ?C)  ?TempSrc: Oral  ?SpO2: 100%  ?Weight: 197 lb 1.6 oz (89.4 kg)  ?Height: 5\' 8"  (1.727 m)  ? ? ?Physical Exam: ?Gen: A&O x3 and in no apparent distress, well appearing and nourished. ?Neck: no masses or nodules, AROM intact. ?CV: RRR, no murmurs, S1/S2 presents  ?Resp: Clear to auscultation bilaterally, normal work of breathing on room air  ?Abd: BS (+) x4, soft, non-tender abdomen, without hepatosplenomegaly or masses ?MSK: trace edema in lower extremities bilaterally ?Skin: good skin turgor, no rashes,  unusual bruising, or prominent lesions.  ?Neuro: No focal deficits, grossly normal sensation and coordination.  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 ? ?Assessment & Plan:  ?Cognitive impairment ?  [name] appears to have cognitive impairment with MoCA score of 13 of 30 today.  His friend, [name], came with him to the appointment.  She gives him his medications.  ?P: ?Repeat MoCa at follow- up visit to verify. Highest level of education was high school and additional point was added.Prior MoCA will be scanned into system ? ?Chronic systolic heart failure ([place]) ?[name] presents with history of heart failure with reduced ejection fraction.  He was admitted earlier this month due to to increased weight, subjective shortness of breath with exertion, and fatigue.  Patient's weight at office visit was 194 pounds and at discharge from admission he weighed 182 pounds.  At that time torsemide 20 mg twice daily was continued and lisinopril held due to being normotensive throughout admission.  Creatinine was around baseline at 1.9 with a GFR of 36 at discharge 3/9. For 2nd admission w TIA, Creatinine increased to 2.11 with GFR 32.  ? ?At hospital follow-up 3/21, creatinine had worsened to 2.26 with GFR 29. His weight was up 4 lbs from discharge 3/9. Torsemide was held with plans for patient to follow-up in 3 days for repeat BMP.  ? ?He followed up 3/28. He endorses continued fatigue with shortness of breath with exertion.  His weight is up by about 12 lbs from last week. On exam, lungs are clear to auscultation bilaterally, trace pitting edema to lower extremities bilaterally. Creatinine improved to 1.67 with GFR 42 with holding torsemide.  ? ?A: ?Last weeks worsening creatinine was concerning for overdiuresis, although bicarb did not reflect contraction alkalosis.  Torsemide was held beginning on 3/22.  His weight is up by about 12 pounds from last week.  His  symptoms are consistent with NYHA class II, which is approximately the same as he was prior to initial admission for heart failure exacerbation.  Unsure of patient's dry weight, but with improvement in creatinine li

## 2021-09-30 NOTE — Patient Instructions (Addendum)
[name], it was a pleasure seeing you today! ? ?Today we discussed: ?Heart valve/ coumadin ?Please take a 1/2 tablet daily until you follow-up next Monday (April 3rd) at 11 am. We need to recheck your labs at that time. ? ?Weak heart ?Your weight seems to be a little up from last visit. I am checking some blood work. It may be that this is the weight that your body does best at. I will call you with the results and we can talk about what changes need to be made. ? ?Kidneys ?Your kidney labs worsened last week. You have been holding torsemide medication. I would like to recheck labs today, continue holding torsemide and lisinopril for now.  ? ?I have ordered the following labs today: ? ? ?Lab Orders    ?     POCT INR     ? ?I will call if any are abnormal. All of your labs can be accessed through "My Chart" ?  ?My Chart Access: ?[email or web address] ? ? ?Referrals ordered today:  ? ?Referral Orders  ?No referral(s) requested today  ?  ? ?I have ordered the following medication/changed the following medications:  ? ?Stop the following medications: ?There are no discontinued medications.  ? ?Start the following medications: ?No orders of the defined types were placed in this encounter. ?  ? ?Follow-up:  1 week   ? ?Please make sure to arrive 15 minutes prior to your next appointment. If you arrive late, you may be asked to reschedule.  ? ?We look forward to seeing you next time. Please call our clinic at [phone] if you have any questions or concerns. The best time to call is Monday-Friday from 9am-4pm, but there is someone available 24/7. If after hours or the weekend, call the main hospital number and ask for the Internal Medicine Resident On-Call. If you need medication refills, please notify your pharmacy one week in advance and they will send [place] a request. ? ?Thank you for letting us take part in your care. Wishing you the best! ? ?Thank you, ?Dr.  Sloan Leiter ?[facility]  ?

## 2021-10-01 LAB — BMP8+ANION GAP
Anion Gap: 14 mmol/L (ref 10.0–18.0)
BUN/Creatinine Ratio: 11 (ref 10–24)
BUN: 19 mg/dL (ref 8–27)
CO2: 19 mmol/L — ABNORMAL LOW (ref 20–29)
Calcium: 9.2 mg/dL (ref 8.6–10.2)
Chloride: 104 mmol/L (ref 96–106)
Creatinine, Ser: 1.67 mg/dL — ABNORMAL HIGH (ref 0.76–1.27)
Glucose: 109 mg/dL — ABNORMAL HIGH (ref 70–99)
Potassium: 3.7 mmol/L (ref 3.5–5.2)
Sodium: 137 mmol/L (ref 134–144)
eGFR: 42 mL/min/{1.73_m2} — ABNORMAL LOW (ref >59)

## 2021-10-01 LAB — PROTEIN / CREATININE RATIO, URINE
Creatinine, Urine: 55.2 mg/dL
Protein, Ur: 8.2 mg/dL
Protein/Creat Ratio: 149 mg/g creat (ref 0–200)

## 2021-10-01 LAB — URINALYSIS, ROUTINE W REFLEX MICROSCOPIC
Bilirubin, UA: NEGATIVE
Glucose, UA: NEGATIVE
Ketones, UA: NEGATIVE
Leukocytes,UA: NEGATIVE
Nitrite, UA: NEGATIVE
Protein,UA: NEGATIVE
Specific Gravity, UA: 1.009 (ref 1.005–1.030)
Urobilinogen, Ur: 1 mg/dL (ref 0.2–1.0)
pH, UA: 7 (ref 5.0–7.5)

## 2021-10-01 LAB — MICROSCOPIC EXAMINATION
Bacteria, UA: NONE SEEN
Casts: NONE SEEN /lpf
Epithelial Cells (non renal): NONE SEEN /hpf (ref 0–10)
WBC, UA: NONE SEEN /hpf (ref 0–5)

## 2021-10-01 NOTE — Assessment & Plan Note (Addendum)
[name] appears to have cognitive impairment with MoCA score of 13 of 30 today.  His friend, Ms. [name], came with him to the appointment.  She gives him his medications.  ?P: ?Repeat MoCa at follow- up visit to verify. Highest level of education was high school and additional point was added.Prior MoCA will be scanned into system ?

## 2021-10-01 NOTE — Assessment & Plan Note (Addendum)
[name] presents with history of CKD stage 3A that has recently progressively worsened to 3b. BMP last week with creatinine of 2.26. Torsemide was held due to concern for overdiuresis although contraction alkalosis not present.  ?A: ?Differentials include pre-renal from overdiuresis, cardiorenal syndrome, intra-renal etiology, or obstruction. Bladder scan post void was completed in clinic and values highest value was 94. This make obstruction unlikely cause.  ?P: ?-UA and protein / creatinine ratio  ?-BMP was repeated and showed improvement in creatinine. ?-will restart torsemide 20 mg qd ?-follow-up next week ? ?Addendum: ?Called and spoke with [name] and asked her to restart torsemide 20 mg qd until follow-up next week ?

## 2021-10-01 NOTE — Assessment & Plan Note (Signed)
INR elevated at 5.0 3/21. Patient was instructed to omit dose for 3/21, to take 2.5 mg 3/22 and 3/23 and follow-up on 3/24. He followed up on 3/28 and INR remained elevated at 4.5. His friend, Ms. Marshell Levan, come with him today. She helps administer his medications. She states that she gave patient 1/2 tab on Thurs, Friday, Saturday. On Sunday and Monday, she gave him 1 tablet.  ?A/P: ?Talked with Ms. Marshell Levan about why patient was on warfarin and why it is important to have dosing at right level to prevent bleeding or stroke if to low.  ?-Patient and Ms. Marshell Levan were given print out of warfarin dosing with verbal instructions for him to take 1/2 tablets until he comes back Monday, April 3rd. ?

## 2021-10-01 NOTE — Assessment & Plan Note (Signed)
[name] presents with history of heart failure with reduced ejection fraction.  He was admitted earlier this month due to to increased weight, subjective shortness of breath with exertion, and fatigue.  Patient's weight at office visit was 194 pounds and at discharge from admission he weighed 182 pounds.  At that time torsemide 20 mg twice daily was continued and lisinopril held due to being normotensive throughout admission.  Creatinine was around baseline at 1.9 with a GFR of 36 at discharge 3/9. For 2nd admission w TIA, Creatinine increased to 2.11 with GFR 32.  ? ?At hospital follow-up 3/21, creatinine had worsened to 2.26 with GFR 29. His weight was up 4 lbs from discharge 3/9. Torsemide was held with plans for patient to follow-up in 3 days for repeat BMP.  ? ?He followed up 3/28. He endorses continued fatigue with shortness of breath with exertion.  His weight is up by about 12 lbs from last week. On exam, lungs are clear to auscultation bilaterally, trace pitting edema to lower extremities bilaterally. Creatinine improved to 1.67 with GFR 42 with holding torsemide.  ? ?A: ?Last weeks worsening creatinine was concerning for overdiuresis, although bicarb did not reflect contraction alkalosis.  Torsemide was held beginning on 3/22.  His weight is up by about 12 pounds from last week.  His symptoms are consistent with NYHA class II, which is approximately the same as he was prior to initial admission for heart failure exacerbation.  Unsure of patient's dry weight, but with improvement in creatinine likely that he does better at 190s his weight. Patient is unable to follow medication regimen and is helped by his friend, [name]. They both live with their daughter and grandchilden. I am consulting chronic care management for assistance with providing extra support to family as patient has several chronic medication conditions that make his medication regimen challenging.  ?P: ?-restart Torsemide 20 mg  qd ?-continue coreg 6.25 mg BID ?-follow-up next week with PCP, Dr. [name]. ?-referral to Chronic care management ?

## 2021-10-02 ENCOUNTER — Telehealth: Payer: Self-pay | Admitting: *Deleted

## 2021-10-02 NOTE — Chronic Care Management (AMB) (Signed)
?  Care Management  ? ?Note ? ?10/02/2021 ?Name: [name] MRN: [id number] DOB: 10-24-1945 ? ?DANZEL MARSZALEK is a 76 y.o. year old male who is a primary care patient of Dellia Cloud, MD. I reached out to [name] by phone today offer care coordination services.  ? ?Mr. Pruett was given information about care management services today including:  ?Care management services include personalized support from designated clinical staff supervised by his physician, including individualized plan of care and coordination with other care providers ?24/7 contact phone numbers for assistance for urgent and routine care needs. ?The patient may stop care management services at any time by phone call to the office staff. ? ?Patient agreed to services and verbal consent obtained.  ? ?Follow up plan: ?Telephone appointment with care management team member scheduled for:10/02/21 ? ?Gwenevere Ghazi  ?Care Guide, Embedded Care Coordination ?[facility]  ?Direct Dial: [phone] ? ?

## 2021-10-03 ENCOUNTER — Ambulatory Visit: Payer: 59

## 2021-10-03 NOTE — Chronic Care Management (AMB) (Signed)
? Care Management ?  ? RN Visit Note ? ?10/03/2021 ?Name: [name] MRN: [id number] DOB: [birth year] ? ?Subjective: ?[name] is a 76 y.o. year old male who is a primary care patient of Richard Payment, MD. The care management team was consulted for assistance with disease management and care coordination needs.   ? ?Engaged with patient by telephone for initial visit in response to provider referral for case management and/or care coordination services.  ? ?Consent to Services:  ? [name] was given information about Care Management services today including:  ?Care Management services includes personalized support from designated clinical staff supervised by his physician, including individualized plan of care and coordination with other care providers ?24/7 contact phone numbers for assistance for urgent and routine care needs. ?The patient may stop case management services at any time by phone call to the office staff. ? ?Patient agreed to services and consent obtained.  ? ?Assessment: Review of patient past medical history, allergies, medications, health status, including review of consultants reports, laboratory and other test data, was performed as part of comprehensive evaluation and provision of chronic care management services.  ? ?SDOH (Social Determinants of Health) assessments and interventions performed:  ?SDOH Interventions   ? ?Flowsheet Row Most Recent Value  ?SDOH Interventions   ?Food Insecurity Interventions Intervention Not Indicated  ?Financial Strain Interventions Intervention Not Indicated  ?Housing Interventions Intervention Not Indicated  ?Physical Activity Interventions Intervention Not Indicated  ?Social Connections Interventions Intervention Not Indicated  ?Transportation Interventions Intervention Not Indicated  ? ?  ?  ? ?Care Plan ? ?No Known Allergies ? ?Outpatient Encounter Medications as of 10/03/2021  ?Medication Sig  ? acetaminophen (TYLENOL) 500 MG tablet Take 1,000 mg  by mouth every 6 (six) hours as needed (pain).  ? atorvastatin (LIPITOR) 20 MG tablet Take 1 tablet (20 mg total) by mouth every evening.  ? carvedilol (COREG) 6.25 MG tablet Take 1 tablet (6.25 mg total) by mouth 2 (two) times daily with a meal.  ? pantoprazole (PROTONIX) 40 MG tablet TAKE 1 TABLET (40 MG TOTAL) BY MOUTH TWICE A DAY BEFORE MEALS (Patient taking differently: Take 40 mg by mouth 2 (two) times daily before a meal.)  ? tamsulosin (FLOMAX) 0.4 MG CAPS capsule Take 2 capsules (0.8 mg total) by mouth daily after breakfast.  ? torsemide (DEMADEX) 20 MG tablet TAKE 1 TABLET BY MOUTH TWICE A DAY  ? warfarin (COUMADIN) 5 MG tablet Take  1/2 tablet (2.5 mg) tablet by mouth on Tuesdays, Wednesdays, and Fridays.Then take whole tablet (5mg ) rest of days (Patient taking differently: Take 2.5-5 mg by mouth See admin instructions. Take 1 tablet (5 mg) on (Sun, Thurs, Sat) & Take 1/2 tablet (2.5 mg) all other days)  ? diclofenac Sodium (VOLTAREN) 1 % GEL APPLY 4 G TOPICALLY 4 TIMES DAILY (Patient not taking: Reported on 06/27/2021)  ? ?No facility-administered encounter medications on file as of 10/03/2021.  ? ? ?Patient Active Problem List  ? Diagnosis Date Noted  ? Pre-diabetes 09/23/2021  ? TIA (transient ischemic attack) 09/15/2021  ? Contact dermatitis 09/09/2021  ? Acute exacerbation of CHF (congestive heart failure) ([place]) 09/09/2021  ? GI bleed 06/27/2021  ? Inguinal hernia, right 04/21/2021  ? Stage 3b chronic kidney disease (CKD) ([place]) 03/26/2016  ? Cognitive impairment 12/24/2015  ? H/O mitral valve replacement with mechanical valve 11/13/2015  ? Benign prostatic hyperplasia with nocturia 11/13/2015  ? Healthcare maintenance 11/13/2015  ? History of arteriovenous malformation (AVM) 03/28/2014  ?  Chronic systolic heart failure ([place]) 06/02/2011  ? Essential hypertension 09/09/2008  ? History of atrial flutter 03/13/2008  ? ? ?Conditions to be addressed/monitored: CHF, HTN, CKD Stage 3, and Cognitive  Decline ? ?Care Plan : RN Care Manager Plan of Care  ?Updates made by Johnney Killian, RN since 10/03/2021 12:00 AM  ?  ? ?Problem: Health Promotion or Disease Self-Management (General Plan of Care)   ?  ? ?Goal: Chronic Disease Management and Care Coordination Needs (CHF, CKD3, HTN, Cognitive decline)   ?Start Date: 10/03/2021  ?This Visit's Progress: Not on track  ?Note:   ?Current Barriers: Successful outreach to patient and his friend [name] (on speaker phone).  Verified identity of patient with him and his name, DOB.  Patient answered all questions appropriately and was very insightful this morning. CHF- We discussed his recent admissions and the importance of patient to monitor his weight daily and to notify the office if he gains between 3-5 pounds per week.  Patient does not have a scale but he and [name] agreed it was important for him to get a scale, weight daily at the same time in the morning after emptying bladder and to keep a log of his weight.  [name] noted she is going to get him a scale and they had the money to do so.  Patient agreed to notify the office if he quickly gains weight.   ?Medications- We discussed his medications and who prepares them for patient.  [name] his friend puts his meds together.  Informed patient that his pharmacy could prepare his meds with simple dose packs (presported tear off packs) and this may be an option for them.  The only issue may be that the patients Coumadin dose is adjusted based on results from Coumadin clinic.  [name] will contact [facility] and request simple dose packs.   ?Exercise- Patient seems to be very active, he shared he had already went for a long walk today.  He rides his bicycle about 3x week for 5 miles. ?Knowledge Deficits related to plan of care for management of CHF, HTN, CKD Stage 3, and Cognitive Issues [facility] 13/30)  ?Chronic Disease Management support and education needs related to CHF, HTN, CKD Stage 3, and Cognitive Issues  ?Cognitive Deficits ?RNCM  Clinical Goal(s):  ?Patient will verbalize basic understanding of  CHF, HTN, CKD Stage 3, and Cognitive decline disease process and self health management plan as evidenced by discussions with RNCM. ?attend all scheduled medical appointments:   as evidenced by 0 no shows ?demonstrate Improved health management independence as evidenced by obtaining scale and weigh yourself daily, doculent weights. ?continue to work with RN Care Manager to address care management and care coordination needs related to  CHF, HTN, CKD Stage 3, and cognitive decline as evidenced by adherence to CM Team Scheduled appointments ?not experience hospital admission as evidenced by review of EMR. Hospital Admissions in last 6 months = 3 and 2 ED visits ?demonstrate a decrease in CHF, HTN, CKD Stage 3, and cognitive decline exacerbations as evidenced by decrease in need for ED visits  through collaboration with RN Care manager, provider, and care team.  ?Interventions: ?1:1 collaboration with primary care provider regarding development and update of comprehensive plan of care as evidenced by provider attestation and co-signature ?Inter-disciplinary care team collaboration (see longitudinal plan of care) ?Evaluation of current treatment plan related to  self management and patient's adherence to plan as established by provider ?Heart Failure Interventions:  (Status:  Goal  on track:  NO.) Long Term Goal ?Basic overview and discussion of pathophysiology of Heart Failure reviewed ?Provided education on low sodium diet ?Assessed need for readable accurate scales in home ?Provided education about placing scale on hard, flat surface ?Advised patient to weigh each morning after emptying bladder ?Discussed importance of daily weight and advised patient to weigh and record daily ?Reviewed role of diuretics in prevention of fluid overload and management of heart failure; ?Discussed the importance of keeping all appointments with provider ?Provided patient  with education about the role of exercise in the management of heart failure ?Assessed social determinant of health barriers  ? ?Hypertension Interventions:  (Status:  Goal on track:  Yes.) Long Term Goal ?Last p

## 2021-10-03 NOTE — Patient Instructions (Signed)
Visit Information ? ?Thank you for taking time to visit with me today. Please don't hesitate to contact me if I can be of assistance to you before our next scheduled telephone appointment. ? ?Following are the goals we discussed today:  ?Obtain a scale and begin weighing yourself daily and keep a journal.  Notify office if you gain 3-5 pounds in a week. ? ?Our next appointment is by telephone on 10/31/21 at 10:00. ? ?Please call the care guide team at [phone] if you need to cancel or reschedule your appointment.  ? ?If you are experiencing a Mental Health or Behavioral Health Crisis or need someone to talk to, please call the [place] National Suicide Prevention Lifeline: 276-036-7696 or TTY: 5625776472 TTY 938-470-2014) to talk to a trained counselor  ? ?The patient verbalized understanding of instructions, educational materials, and care plan provided today and agreed to receive a mailed copy of patient instructions, educational materials, and care plan.  ? ?The patient has been provided with contact information for the care management team and has been advised to call with any health related questions or concerns.  ? ?Jodelle Gross, RN, BSN, CCM ?Care Management Coordinator ?[facility] ?Phone: [phone]/Fax: [phone]  ?

## 2021-10-04 ENCOUNTER — Other Ambulatory Visit: Payer: Self-pay | Admitting: Internal Medicine

## 2021-10-11 ENCOUNTER — Other Ambulatory Visit: Payer: Self-pay | Admitting: Internal Medicine

## 2021-10-15 ENCOUNTER — Encounter: Payer: 59 | Admitting: Internal Medicine

## 2021-10-20 ENCOUNTER — Other Ambulatory Visit: Payer: Self-pay

## 2021-10-20 ENCOUNTER — Ambulatory Visit (INDEPENDENT_AMBULATORY_CARE_PROVIDER_SITE_OTHER): Payer: 59 | Admitting: Pharmacist

## 2021-10-20 ENCOUNTER — Encounter: Payer: Self-pay | Admitting: Internal Medicine

## 2021-10-20 ENCOUNTER — Ambulatory Visit (INDEPENDENT_AMBULATORY_CARE_PROVIDER_SITE_OTHER): Payer: 59 | Admitting: Internal Medicine

## 2021-10-20 VITALS — BP 123/64 | HR 96 | Temp 98.1°F | Ht 68.0 in | Wt 196.1 lb

## 2021-10-20 DIAGNOSIS — I5023 Acute on chronic systolic (congestive) heart failure: Secondary | ICD-10-CM

## 2021-10-20 DIAGNOSIS — Z952 Presence of prosthetic heart valve: Secondary | ICD-10-CM | POA: Diagnosis not present

## 2021-10-20 DIAGNOSIS — Z8679 Personal history of other diseases of the circulatory system: Secondary | ICD-10-CM

## 2021-10-20 DIAGNOSIS — N1831 Chronic kidney disease, stage 3a: Secondary | ICD-10-CM

## 2021-10-20 DIAGNOSIS — M79662 Pain in left lower leg: Secondary | ICD-10-CM

## 2021-10-20 DIAGNOSIS — M79661 Pain in right lower leg: Secondary | ICD-10-CM

## 2021-10-20 DIAGNOSIS — I5022 Chronic systolic (congestive) heart failure: Secondary | ICD-10-CM

## 2021-10-20 LAB — POCT INR: INR: 2 (ref 2.0–3.0)

## 2021-10-20 NOTE — Assessment & Plan Note (Addendum)
Patient presents with difficulty sleeping and bilateral leg pain. He was admitted to the hospital on 09/09/2021 for a heart failure exacerbation and was diuresed for 2 days before being discharged. Today, evidence of fluid overload. Increased shortness of breath at night causing patient to wake up and have to walk around. Increased pain in his legs when lying in bed at night that improves with walking. On exam, evidence of BLE swelling and L hepatojugular reflux with abdominal compression. Patient very tired-appearing. Patient has returned to 196lbs, and he was 197lbs when hospitalized with exacerbation previously with baseline weight in 180s. Given worsening clinical picture, ordered BMP and discussed need to diurese patient to prevent exacerbation requiring hospitalization. Planning to double dose of torsemide for 1 week and have patient follow up in a few days. ? ?Plan ?- Take 2 tablets of torsemide (40mg  total) for 1 week ?- Follow-up BMP results ?- Requested patient follow-up Thursday or Friday this week ?- Recommended restricting fluid intake to < 2 Liters (< 64 ounce) ?- Recommended decreasing salt intake (< 2 gram)  ?

## 2021-10-20 NOTE — Progress Notes (Signed)
Attestation for Student Documentation: ? ?I personally was present and performed or re-performed the history, physical exam and medical decision-making activities of this service and have verified that the service and findings are accurately documented in the student?s note. ? ?Dellia Cloud, MD ?10/21/2021, 10:50 AM  ?

## 2021-10-20 NOTE — Patient Instructions (Signed)
Patient instructed to take medications as defined in the Anti-coagulation Track section of this encounter.  ?Patient instructed to today's dose.  ?Patient instructed to take one tablet on Mondays of each week. All other days, take only 1/2 tablet.  ?Patient verbalized understanding of these instructions.  take ?

## 2021-10-20 NOTE — Assessment & Plan Note (Signed)
Patient endorsing BLE pain. He says the pain starts in his feet and he feels a shooting pain into his lower legs. He wakes up with ankle soreness and tries to soak his feet, but he still feels aching pain in the inner leg below his calf. He says rubbing alcohol helps, and he takes Tylenol sometimes, whic also helps.  He says the pain feels better when he walks but also says he feels the pain with lying down that improves when he walks. He says he feels burning pain and pressure sometimes. On exam, peripheral pulses intact bilaterally but weakened, both feet were warm, and there was no evidence of tenderness to palpation. ? ?Given patient has evidence of fluid overload, planning to diurese patient. Will consider pursuing ABIs in the future given hx of TIA and symptoms that have some similarities to claudication. ?

## 2021-10-20 NOTE — Progress Notes (Signed)
Anticoagulation Management ?[name] is a 76 y.o. male who reports to the clinic for monitoring of warfarin treatment.   ? ?Indication:  Mitral valve replaced; INR range 2.5 - 3.5   ?Duration: indefinite ?Supervising physician:  Velna Ochs,  MD ? ?Anticoagulation Clinic Visit History: ?Patient does not report signs/symptoms of bleeding or thromboembolism  ?Other recent changes: No diet, medications, lifestyle changes.  ?Anticoagulation Episode Summary   ? ? Current INR goal:  2.5-3.5  ?TTR:  60.3 % (9.1 y)  ?Next INR check:  11/10/2021  ?INR from last check:  2.0 (10/20/2021)  ?Weekly max warfarin dose:    ?Target end date:  Indefinite  ?INR check location:  Anticoagulation Clinic  ?Preferred lab:    ?Send INR reminders to:  ANTICOAG [facility]  ? Indications   ?History of atrial flutter [Z86.79] ?Long term (current) use of anticoagulants (Resolved) [Z79.01] ?ATRIAL FLUTTER (Resolved) [I48.92] ?H/O mitral valve replacement with mechanical valve [Z95.2] ? ?  ?  ? ? Comments:    ?  ? ?  ? ?Anticoagulation Care Providers   ? ? Provider Role Specialty Phone number  ? Bensimhon, Shaune Pascal, MD  Cardiology [phone]  ? ?  ? ? ?No Known Allergies ? ?Current Outpatient Medications:  ?  atorvastatin (LIPITOR) 20 MG tablet, Take 1 tablet (20 mg total) by mouth every evening., Disp: 30 tablet, Rfl: 2 ?  carvedilol (COREG) 6.25 MG tablet, Take 1 tablet (6.25 mg total) by mouth 2 (two) times daily with a meal., Disp: 180 tablet, Rfl: 3 ?  pantoprazole (PROTONIX) 40 MG tablet, TAKE 1 TABLET (40 MG TOTAL) BY MOUTH TWICE A DAY BEFORE MEALS, Disp: 180 tablet, Rfl: 1 ?  tamsulosin (FLOMAX) 0.4 MG CAPS capsule, Take 2 capsules (0.8 mg total) by mouth daily after breakfast., Disp: 180 capsule, Rfl: 3 ?  torsemide (DEMADEX) 20 MG tablet, TAKE 1 TABLET BY MOUTH TWICE A DAY, Disp: 180 tablet, Rfl: 0 ?  warfarin (COUMADIN) 5 MG tablet, Take  1/2 tablet (2.5 mg) tablet by mouth on Tuesdays, Wednesdays, and Fridays.Then take whole tablet  (5mg ) rest of days (Patient taking differently: Take 2.5-5 mg by mouth See admin instructions. Take 1 tablet (5 mg) on (Sun, Thurs, Sat) & Take 1/2 tablet (2.5 mg) all other days), Disp: 30 tablet, Rfl: 1 ?  acetaminophen (TYLENOL) 500 MG tablet, Take 1,000 mg by mouth every 6 (six) hours as needed (pain). (Patient not taking: Reported on 10/20/2021), Disp: , Rfl:  ?  diclofenac Sodium (VOLTAREN) 1 % GEL, APPLY 4 G TOPICALLY 4 TIMES DAILY (Patient not taking: Reported on 06/27/2021), Disp: 400 g, Rfl: 11 ?Past Medical History:  ?Diagnosis Date  ? Acute GI bleeding 06/27/2021  ? Anemia 01/29/2016  ? Atrial fibrillation ([place])   ? post op.  s/p dc-cv 04/2008. previously on amiodarone  ? Atrial flutter ([place])   ? atypical  ? AV block, 1st degree   ? .450 msec--progressive  ? 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  a- Echo 07/2008 Ef 55% mild peroprosthetic MVR with High transmitral gradient (mean 14).  b- normal coronaries by cath 12/2007  ? BPH (benign prostatic hyperplasia)   ? Cardiac resynchronization therapy pacemaker (CRT-P) in place 06/22/2011  ? CHF (congestive heart failure) ([place])   ? EF 35-40 % 2011 due to valvular disease and diastolic dysfunction  ? Chronic back pain   ? CKD (chronic kidney disease), stage III ([place])   ? COVID-19  virus infection 07/24/2020  ? Tested positive on July 16, 2020  ? Dysphagia   ? with normal barium swallow 09/2008  ? Endocarditis   ? GERD (gastroesophageal reflux disease)   ? Heavy alcohol use   ? history  ? History of atrial flutter 03/13/2008  ? History of atrial flutter - resolved.   ? History of GI bleed   ? Hypertension   ? IV drug abuse ([place])   ? history of  ? Lower GI bleed 01/2016  ? Memory impairment 12/24/2015  ? Pacemaker 2010  ? ?Social History  ? ?Socioeconomic History  ? Marital status: Divorced  ?  Spouse name: Not on file  ? Number of children: 9  ? Years of education: Not on file  ? Highest education level: Not on file   ?Occupational History  ? Occupation: retired  ?  Comment: [employer]  ?Tobacco Use  ? Smoking status: Former  ?  Years: 0.00  ?  Types: Cigarettes  ? Smokeless tobacco: Never  ?Vaping Use  ? Vaping Use: Never used  ?Substance and Sexual Activity  ? Alcohol use: No  ?  Alcohol/week: 0.0 standard drinks  ? Drug use: No  ? Sexual activity: Not Currently  ?Other Topics Concern  ? Not on file  ?Social History Narrative  ? Current Social History 02/16/2019    ?   ? Patient lives with family (baby's 72, daughter and 4 grandkids:  60 yo twins, 14 yo and 35 yo in a home which is 1 story. There are 4 steps with handrails up to the entrance the patient uses.   ?   ? Patient's method of transportation is via family member (baby's mama).  ?   ? The highest level of education was high school diploma.  ?   ? The patient currently retired.  ?   ? Identified important Relationships are "My 63 Joselyn Arrow, [name]."   ?   ? Pets : None  ?    ? Interests / Fun: Sitting outdoors watching the birds and planes."   ?   ? Exercise riding a bike 5 miles 3 days/week  ?   ? Current Stressors: The grandkids   ?   ? Religious / Personal Beliefs: "I believe in God."   ?   ? L. Ducatte, RN, BSN   ?   ? ?Social Determinants of Health  ? ?Financial Resource Strain: Low Risk   ? Difficulty of Paying Living Expenses: Not very hard  ?Food Insecurity: No Food Insecurity  ? Worried About Charity fundraiser in the Last Year: Never true  ? Ran Out of Food in the Last Year: Never true  ?Transportation Needs: No Transportation Needs  ? Lack of Transportation (Medical): No  ? Lack of Transportation (Non-Medical): No  ?Physical Activity: Sufficiently Active  ? Days of Exercise per Week: 3 days  ? Minutes of Exercise per Session: 50 min  ?Stress: Not on file  ?Social Connections: Unknown  ? Frequency of Communication with Friends and Family: More than three times a week  ? Frequency of Social Gatherings with Friends and Family: More than three times a week   ? Attends Religious Services: Not on file  ? Active Member of Clubs or Organizations: No  ? Attends Archivist Meetings: Never  ? Marital Status: Not on file  ? ?Family History  ?Problem Relation Age of Onset  ? Coronary artery disease Mother   ? Cancer Father   ?  GI  ? Cancer Brother   ?     Lung  ? ? ?ASSESSMENT ?Recent Results: ?The most recent result is correlated with 15 mg per week: ?Lab Results  ?Component Value Date  ? INR 2.0 10/20/2021  ? INR 4.5 (A) 09/30/2021  ? INR 5.0 (A) 09/23/2021  ? ? ?Anticoagulation Dosing: ?Description   ?Take one tablet on Mondays of each week. All other days, take only 1/2 tablet.  ?  ?  ?INR today: Subtherapeutic ? ?PLAN ?Weekly dose was increased by  25 % to 20 mg per week ? ?Patient Instructions  ?Patient instructed to take medications as defined in the Anti-coagulation Track section of this encounter.  ?Patient instructed to today's dose.  ?Patient instructed to take one tablet on Mondays of each week. All other days, take only 1/2 tablet.  ?Patient verbalized understanding of these instructions.  take ?Patient advised to contact clinic or seek medical attention if signs/symptoms of bleeding or thromboembolism occur. ? ?Patient verbalized understanding by repeating back information and was advised to contact me if further medication-related questions arise. Patient was also provided an information handout. ? ?Follow-up ?Return in 3 weeks (on 11/10/2021) for Follow up INR. ? ?Pennie Banter, PharmD, CPP ? ?15 minutes spent face-to-face with the patient during the encounter. 50% of time spent on education, including signs/sx bleeding and clotting, as well as food and drug interactions with warfarin. 50% of time was spent on fingerprick POC INR sample collection,processing, results determination, and documentation in http://www.kim.net/.  ?

## 2021-10-20 NOTE — Patient Instructions (Signed)
Thank you, Mr.Khalid T Higbie for allowing [place] to provide your care today. Today we discussed heart failure.   ? ?Labs/Tests Ordered: ? ?Lab Orders    ?     BMP8+Anion Gap     ? ?Referrals Ordered:  ?Referral Orders  ?No referral(s) requested today  ?  ? ?Medication Changes:  ?There are no discontinued medications.  ? ?No orders of the defined types were placed in this encounter. ?  ? ?Health Maintenance Screening: ?There are no preventive care reminders to display for this patient.  ? ?Instructions:  ? ?- Please take 40 mg of torsemide every day for 1 week.  ?- Make a follow up appointment for Thursday or Friday of this week ?- Please restrict fluid intake to < 2 Liters (< 64 once) ?- Please decrease salt intake (< 2 gram)  ? ?Follow up:  1 week   ? ?Remember: If you have any questions or concerns, call our clinic at [phone] or after hours call [phone] and ask for the internal medicine resident on call. ? ?Dellia Cloud, D.O. ?[facility] ? ? ? ?

## 2021-10-20 NOTE — Progress Notes (Signed)
? ?This is a Careers information officer Note.  The care of the patient was discussed with Dr. [name], MD and the assessment and plan was formulated with their assistance.  Please see their note for official documentation of the patient encounter.  ? ?Subjective:  ? ?Patient ID: [name] male   DOB: 1945-10-05 76 y.o.   MRN: [id number] ? ?HPI: ?[name] is a 76 y.o. with PMHx of HFrEF (35-40% on 09/10/2021), mitral valve replacement and atrial flutter on anticoagulation, HTN, CKD III, atrial flutter who presents with difficulty sleeping and bilateral leg pain. See problem-based charting for assessment and plan. ? ? ?Past Medical History:  ?Diagnosis Date  ? Acute GI bleeding 06/27/2021  ? Anemia 01/29/2016  ? Atrial fibrillation ([place])   ? post op.  s/p dc-cv 04/2008. previously on amiodarone  ? Atrial flutter ([place])   ? atypical  ? AV block, 1st degree   ? .450 msec--progressive  ? 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  a- Echo 07/2008 Ef 55% mild peroprosthetic MVR with High transmitral gradient (mean 14).  b- normal coronaries by cath 12/2007  ? BPH (benign prostatic hyperplasia)   ? Cardiac resynchronization therapy pacemaker (CRT-P) in place 06/22/2011  ? CHF (congestive heart failure) ([place])   ? EF 35-40 % 2011 due to valvular disease and diastolic dysfunction  ? Chronic back pain   ? CKD (chronic kidney disease), stage III ([place])   ? COVID-19 virus infection 07/24/2020  ? Tested positive on July 16, 2020  ? Dysphagia   ? with normal barium swallow 09/2008  ? Endocarditis   ? GERD (gastroesophageal reflux disease)   ? Heavy alcohol use   ? history  ? History of atrial flutter 03/13/2008  ? History of atrial flutter - resolved.   ? History of GI bleed   ? Hypertension   ? IV drug abuse ([place])   ? history of  ? Lower GI bleed 01/2016  ? Memory impairment 12/24/2015  ? Pacemaker 2010  ? ?Current Outpatient Medications  ?Medication Sig Dispense Refill  ? acetaminophen  (TYLENOL) 500 MG tablet Take 1,000 mg by mouth every 6 (six) hours as needed (pain). (Patient not taking: Reported on 10/20/2021)    ? atorvastatin (LIPITOR) 20 MG tablet Take 1 tablet (20 mg total) by mouth every evening. 30 tablet 2  ? carvedilol (COREG) 6.25 MG tablet Take 1 tablet (6.25 mg total) by mouth 2 (two) times daily with a meal. 180 tablet 3  ? diclofenac Sodium (VOLTAREN) 1 % GEL APPLY 4 G TOPICALLY 4 TIMES DAILY (Patient not taking: Reported on 06/27/2021) 400 g 11  ? pantoprazole (PROTONIX) 40 MG tablet TAKE 1 TABLET (40 MG TOTAL) BY MOUTH TWICE A DAY BEFORE MEALS 180 tablet 1  ? tamsulosin (FLOMAX) 0.4 MG CAPS capsule Take 2 capsules (0.8 mg total) by mouth daily after breakfast. 180 capsule 3  ? torsemide (DEMADEX) 20 MG tablet TAKE 1 TABLET BY MOUTH TWICE A DAY 180 tablet 0  ? warfarin (COUMADIN) 5 MG tablet Take  1/2 tablet (2.5 mg) tablet by mouth on Tuesdays, Wednesdays, and Fridays.Then take whole tablet (5mg ) rest of days (Patient taking differently: Take 2.5-5 mg by mouth See admin instructions. Take 1 tablet (5 mg) on (Sun, Thurs, Sat) & Take 1/2 tablet (2.5 mg) all other days) 30 tablet 1  ? ?No current facility-administered medications for this visit.  ? ?Family History  ?Problem Relation Age  of Onset  ? Coronary artery disease Mother   ? Cancer Father   ?     GI  ? Cancer Brother   ?     Lung  ? ?Social History  ? ?Socioeconomic History  ? Marital status: Divorced  ?  Spouse name: Not on file  ? Number of children: 9  ? Years of education: Not on file  ? Highest education level: Not on file  ?Occupational History  ? Occupation: retired  ?  Comment: [employer]  ?Tobacco Use  ? Smoking status: Former  ?  Years: 0.00  ?  Types: Cigarettes  ? Smokeless tobacco: Never  ?Vaping Use  ? Vaping Use: Never used  ?Substance and Sexual Activity  ? Alcohol use: No  ?  Alcohol/week: 0.0 standard drinks  ? Drug use: No  ? Sexual activity: Not Currently  ?Other Topics Concern  ? Not on file  ?Social  History Narrative  ? Current Social History 02/16/2019    ?   ? Patient lives with family (baby's 38, daughter and 4 grandkids:  67 yo twins, 40 yo and 63 yo in a home which is 1 story. There are 4 steps with handrails up to the entrance the patient uses.   ?   ? Patient's method of transportation is via family member (baby's mama).  ?   ? The highest level of education was high school diploma.  ?   ? The patient currently retired.  ?   ? Identified important Relationships are "My 59 Joselyn Arrow, [name]."   ?   ? Pets : None  ?    ? Interests / Fun: Sitting outdoors watching the birds and planes."   ?   ? Exercise riding a bike 5 miles 3 days/week  ?   ? Current Stressors: The grandkids   ?   ? Religious / Personal Beliefs: "I believe in God."   ?   ? L. Ducatte, RN, BSN   ?   ? ?Social Determinants of Health  ? ?Financial Resource Strain: Low Risk   ? Difficulty of Paying Living Expenses: Not very hard  ?Food Insecurity: No Food Insecurity  ? Worried About Charity fundraiser in the Last Year: Never true  ? Ran Out of Food in the Last Year: Never true  ?Transportation Needs: No Transportation Needs  ? Lack of Transportation (Medical): No  ? Lack of Transportation (Non-Medical): No  ?Physical Activity: Sufficiently Active  ? Days of Exercise per Week: 3 days  ? Minutes of Exercise per Session: 50 min  ?Stress: Not on file  ?Social Connections: Unknown  ? Frequency of Communication with Friends and Family: More than three times a week  ? Frequency of Social Gatherings with Friends and Family: More than three times a week  ? Attends Religious Services: Not on file  ? Active Member of Clubs or Organizations: No  ? Attends Archivist Meetings: Never  ? Marital Status: Not on file  ? ?Review of Systems: ?Pertinent items are noted in HPI. ?Objective:  ?Physical Exam: ?Vitals:  ? 10/20/21 1120  ?BP: 123/64  ?Pulse: 96  ?Temp: 98.1 ?F (36.7 ?C)  ?TempSrc: Oral  ?SpO2: 98%  ?Weight: 196 lb 1.6 oz (89 kg)  ?Height:  5\' 8"  (1.727 m)  ? ?BP 123/64 (BP Location: Right Arm, Patient Position: Sitting, Cuff Size: Normal)   Pulse 96   Temp 98.1 ?F (36.7 ?C) (Oral)   Ht 5\' 8"  (1.727  m)   Wt 196 lb 1.6 oz (89 kg)   SpO2 98%   BMI 29.82 kg/m?  ? ?General Appearance:    Alert, cooperative, tired-appearing but no distress  ?Head:    Normocephalic, atraumatic  ?Neck:     Evidence of L hepatojugular reflux with abdominal compression  ?Lungs:     Respirations unlabored  ?Heart:    Regular rate and rhythm, S1 and S2 normal  ?Abdomen:     Soft, non-tender  ?Extremities:   Bilateral feet warm to touch, sensation intact bilaterally, thick long toenails bilaterally  ?Pulses:   1+ pulses in bilateral feet DP and PT  ?Skin:   Skin color, texture, turgor normal, no rashes or lesions  ? ?Assessment & Plan:  ? ?See encounters tab for problem-based charting. ? ?Acute exacerbation of CHF (congestive heart failure) ([place]) ?Patient presents with difficulty sleeping and bilateral leg pain. He was admitted to the hospital on 09/09/2021 for a heart failure exacerbation and was diuresed for 2 days before being discharged. Today, evidence of fluid overload. Increased shortness of breath at night causing patient to wake up and have to walk around. Increased pain in his legs when lying in bed at night that improves with walking. On exam, evidence of BLE swelling and L hepatojugular reflux with abdominal compression. Patient very tired-appearing. Patient has returned to 196lbs, and he was 197lbs when hospitalized with exacerbation previously with baseline weight in 180s. Given worsening clinical picture, ordered BMP and discussed need to diurese patient to prevent exacerbation requiring hospitalization. Planning to double dose of torsemide for 1 week and have patient follow up in a few days. ? ?Plan ?- Take 2 tablets of torsemide (40mg  total) for 1 week ?- Follow-up BMP results ?- Requested patient follow-up Thursday or Friday this week ?- Recommended  restricting fluid intake to < 2 Liters (< 64 ounce) ?- Recommended decreasing salt intake (< 2 gram)  ? ?Lower leg pain ?Patient endorsing BLE pain. He says the pain starts in his feet and he feels a shooting pain into h

## 2021-10-21 ENCOUNTER — Encounter: Payer: Self-pay | Admitting: Internal Medicine

## 2021-10-21 LAB — BMP8+ANION GAP
Anion Gap: 18 mmol/L (ref 10.0–18.0)
BUN/Creatinine Ratio: 12 (ref 10–24)
BUN: 20 mg/dL (ref 8–27)
CO2: 25 mmol/L (ref 20–29)
Calcium: 9.2 mg/dL (ref 8.6–10.2)
Chloride: 97 mmol/L (ref 96–106)
Creatinine, Ser: 1.68 mg/dL — ABNORMAL HIGH (ref 0.76–1.27)
Glucose: 101 mg/dL — ABNORMAL HIGH (ref 70–99)
Potassium: 3.3 mmol/L — ABNORMAL LOW (ref 3.5–5.2)
Sodium: 140 mmol/L (ref 134–144)
eGFR: 42 mL/min/{1.73_m2} — ABNORMAL LOW (ref >59)

## 2021-10-21 NOTE — Progress Notes (Signed)
Internal Medicine Clinic Attending  Case discussed with Dr. Masters  at the time of the visit.  We reviewed the resident's history and exam and pertinent patient test results.  I agree with the assessment, diagnosis, and plan of care documented in the resident's note.  

## 2021-10-23 ENCOUNTER — Encounter: Payer: Self-pay | Admitting: Internal Medicine

## 2021-10-23 ENCOUNTER — Ambulatory Visit (HOSPITAL_COMMUNITY): Admit: 2021-10-23 | Discharge: 2021-10-23 | Disposition: A | Discharge status: Home / Self Care | Payer: 59

## 2021-10-23 ENCOUNTER — Ambulatory Visit (INDEPENDENT_AMBULATORY_CARE_PROVIDER_SITE_OTHER): Payer: 59 | Admitting: Internal Medicine

## 2021-10-23 ENCOUNTER — Telehealth: Payer: Self-pay

## 2021-10-23 VITALS — BP 132/75 | HR 94 | Temp 98.2°F | Ht 68.0 in | Wt 196.8 lb

## 2021-10-23 DIAGNOSIS — R4189 Other symptoms and signs involving cognitive functions and awareness: Secondary | ICD-10-CM | POA: Diagnosis not present

## 2021-10-23 DIAGNOSIS — I5022 Chronic systolic (congestive) heart failure: Secondary | ICD-10-CM

## 2021-10-23 DIAGNOSIS — M79661 Pain in right lower leg: Secondary | ICD-10-CM | POA: Diagnosis not present

## 2021-10-23 DIAGNOSIS — N1832 Chronic kidney disease, stage 3b: Secondary | ICD-10-CM | POA: Diagnosis not present

## 2021-10-23 DIAGNOSIS — M79662 Pain in left lower leg: Secondary | ICD-10-CM

## 2021-10-23 DIAGNOSIS — F03A0 Unspecified dementia, mild, without behavioral disturbance, psychotic disturbance, mood disturbance, and anxiety: Secondary | ICD-10-CM

## 2021-10-23 DIAGNOSIS — I482 Chronic atrial fibrillation, unspecified: Secondary | ICD-10-CM

## 2021-10-23 LAB — BASIC METABOLIC PANEL
Anion gap: 10 (ref 5–15)
BUN: 20 mg/dL (ref 8–23)
CO2: 28 mmol/L (ref 22–32)
Calcium: 9.2 mg/dL (ref 8.9–10.3)
Chloride: 100 mmol/L (ref 98–111)
Creatinine, Ser: 1.92 mg/dL — ABNORMAL HIGH (ref 0.61–1.24)
GFR, Estimated: 35 mL/min — ABNORMAL LOW (ref >60)
Glucose, Bld: 110 mg/dL — ABNORMAL HIGH (ref 70–99)
Potassium: 3.3 mmol/L — ABNORMAL LOW (ref 3.5–5.1)
Sodium: 138 mmol/L (ref 135–145)

## 2021-10-23 LAB — MAGNESIUM: Magnesium: 1.8 mg/dL (ref 1.7–2.4)

## 2021-10-23 NOTE — Progress Notes (Signed)
? ? ?Subjective:  ?CC: CHF ? ?HPI: ? ?[name] is a 76 y.o. male with a past medical history stated below and presents today for CHF. Please see problem based assessment and plan for additional details. ? ?Past Medical History:  ?Diagnosis Date  ? Acute GI bleeding 06/27/2021  ? Anemia 01/29/2016  ? Atrial fibrillation ([place])   ? post op.  s/p dc-cv 04/2008. previously on amiodarone  ? Atrial flutter ([place])   ? atypical  ? AV block, 1st degree   ? .450 msec--progressive  ? 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  a- Echo 07/2008 Ef 55% mild peroprosthetic MVR with High transmitral gradient (mean 14).  b- normal coronaries by cath 12/2007  ? BPH (benign prostatic hyperplasia)   ? Cardiac resynchronization therapy pacemaker (CRT-P) in place 06/22/2011  ? CHF (congestive heart failure) ([place])   ? EF 35-40 % 2011 due to valvular disease and diastolic dysfunction  ? Chronic back pain   ? CKD (chronic kidney disease), stage III ([place])   ? COVID-19 virus infection 07/24/2020  ? Tested positive on July 16, 2020  ? Dysphagia   ? with normal barium swallow 09/2008  ? Endocarditis   ? GERD (gastroesophageal reflux disease)   ? Heavy alcohol use   ? history  ? History of atrial flutter 03/13/2008  ? History of atrial flutter - resolved.   ? History of GI bleed   ? Hypertension   ? IV drug abuse ([place])   ? history of  ? Lower GI bleed 01/2016  ? Memory impairment 12/24/2015  ? Pacemaker 2010  ? ? ?Current Outpatient Medications on File Prior to Visit  ?Medication Sig Dispense Refill  ? acetaminophen (TYLENOL) 500 MG tablet Take 1,000 mg by mouth every 6 (six) hours as needed (pain). (Patient not taking: Reported on 10/20/2021)    ? atorvastatin (LIPITOR) 20 MG tablet Take 1 tablet (20 mg total) by mouth every evening. 30 tablet 2  ? carvedilol (COREG) 6.25 MG tablet Take 1 tablet (6.25 mg total) by mouth 2 (two) times daily with a meal. 180 tablet 3  ? diclofenac Sodium (VOLTAREN) 1 % GEL  APPLY 4 G TOPICALLY 4 TIMES DAILY (Patient not taking: Reported on 06/27/2021) 400 g 11  ? pantoprazole (PROTONIX) 40 MG tablet TAKE 1 TABLET (40 MG TOTAL) BY MOUTH TWICE A DAY BEFORE MEALS 180 tablet 1  ? tamsulosin (FLOMAX) 0.4 MG CAPS capsule Take 2 capsules (0.8 mg total) by mouth daily after breakfast. 180 capsule 3  ? torsemide (DEMADEX) 20 MG tablet TAKE 1 TABLET BY MOUTH TWICE A DAY 180 tablet 0  ? warfarin (COUMADIN) 5 MG tablet Take  1/2 tablet (2.5 mg) tablet by mouth on Tuesdays, Wednesdays, and Fridays.Then take whole tablet (5mg ) rest of days (Patient taking differently: Take 2.5-5 mg by mouth See admin instructions. Take 1 tablet (5 mg) on (Sun, Thurs, Sat) & Take 1/2 tablet (2.5 mg) all other days) 30 tablet 1  ? ?No current facility-administered medications on file prior to visit.  ? ? ?Family History  ?Problem Relation Age of Onset  ? Coronary artery disease Mother   ? Cancer Father   ?     GI  ? Cancer Brother   ?     Lung  ? ? ?Social History  ? ?Socioeconomic History  ? Marital status: Divorced  ?  Spouse name: Not on file  ? Number of children: 9  ?  Years of education: Not on file  ? Highest education level: Not on file  ?Occupational History  ? Occupation: retired  ?  Comment: [place]  ?Tobacco Use  ? Smoking status: Former  ?  Years: 0.00  ?  Types: Cigarettes  ? Smokeless tobacco: Never  ?Vaping Use  ? Vaping Use: Never used  ?Substance and Sexual Activity  ? Alcohol use: No  ?  Alcohol/week: 0.0 standard drinks  ? Drug use: No  ? Sexual activity: Not Currently  ?Other Topics Concern  ? Not on file  ?Social History Narrative  ? Current Social History 02/16/2019    ?   ? Patient lives with family (baby's 16, daughter and 4 grandkids:  46 yo twins, 51 yo and 78 yo in a home which is 1 story. There are 4 steps with handrails up to the entrance the patient uses.   ?   ? Patient's method of transportation is via family member (baby's mama).  ?   ? The highest level of education was high  school diploma.  ?   ? The patient currently retired.  ?   ? Identified important Relationships are "My 61 Joselyn Arrow, [name]."   ?   ? Pets : None  ?    ? Interests / Fun: Sitting outdoors watching the birds and planes."   ?   ? Exercise riding a bike 5 miles 3 days/week  ?   ? Current Stressors: The grandkids   ?   ? Religious / Personal Beliefs: "I believe in God."   ?   ? L. [place], RN, BSN   ?   ? ?Social Determinants of Health  ? ?Financial Resource Strain: Low Risk   ? Difficulty of Paying Living Expenses: Not very hard  ?Food Insecurity: No Food Insecurity  ? Worried About Charity fundraiser in the Last Year: Never true  ? Ran Out of Food in the Last Year: Never true  ?Transportation Needs: No Transportation Needs  ? Lack of Transportation (Medical): No  ? Lack of Transportation (Non-Medical): No  ?Physical Activity: Sufficiently Active  ? Days of Exercise per Week: 3 days  ? Minutes of Exercise per Session: 50 min  ?Stress: Not on file  ?Social Connections: Unknown  ? Frequency of Communication with Friends and Family: More than three times a week  ? Frequency of Social Gatherings with Friends and Family: More than three times a week  ? Attends Religious Services: Not on file  ? Active Member of Clubs or Organizations: No  ? Attends Archivist Meetings: Never  ? Marital Status: Not on file  ?Intimate Partner Violence: Not on file  ? ? ?Review of Systems: ?ROS negative except for what is noted on the assessment and plan. ? ?Objective:  ? ?Vitals:  ? 10/23/21 1445  ?BP: 132/75  ?Pulse: 94  ?Temp: 98.2 ?F (36.8 ?C)  ?TempSrc: Oral  ?SpO2: 100%  ?Weight: 196 lb 12.8 oz (89.3 kg)  ?Height: 5\' 8"  (1.727 m)  ? ? ?Physical Exam: ?Gen: A&O x3 and in no apparent distress, well appearing and nourished. ?HEENT:  ?  Head - normocephalic, atraumatic.  ?  Eye - visual acuity grossly intact, conjunctiva clear, sclera non-icteric, EOM intact.  ?  Mouth - No obvious caries or periodontal disease. ?Neck: no  masses or nodules, AROM intact. ?CV: RRR, no murmurs, S1/S2 presents  ?Resp: Clear to ascultation bilaterally  ?Abd: BS (+) x4, soft, non-tender abdomen, without hepatosplenomegaly  or masses ?MSK: Grossly normal AROM and strength x4 extremities. ?Skin: good skin turgor, no rashes, unusual bruising, or prominent lesions.  ?Neuro: No focal deficits, grossly normal sensation and coordination.  ?Psych: Oriented x3 and responding appropriately. Intact memory, normal mood, judgement, affect, and insight.  ? ? ?Assessment & Plan:  ?See Encounters Tab for problem based charting. ? ?Chronic biventricular, systolic heart failure ([place]) ?HPI: Patient presents for 1 week follow-up from her recent clinic visit for CHF.  At that time patient presented with significant signs and symptoms of heart failure.  He was instructed to double his dose of torsemide for 5 days and return to the clinic for further evaluation and management.  I am concerned the patient has some underlying cognitive deficits that may have complicated his medication management of the last week.  Therefore, I called his partner who states that he did double his dose of torsemide each day successfully.  She states that his shortness of breath has worsened over this time but continues to urinate appropriately. ? ?During his visit today, the patient found to have a 10 pound weight gain, increased lower extremity edema, exertional dyspnea, orthopnea, and paroxysmal nocturnal dyspnea. He denied any chest pain, palpitations, dizziness, diaphoresis, or presyncopal symptoms.  ? ?On exam patient had significant lower extremity edema with roughly 3+ to his knees, significant JVD, bibasilar expiratory crackles. Patient was not in respiratory distress.  ?EKG showed atrial fibrillation with normal rate. There is a new left axis deviation and new T-wave inversion and q-waves in the inferior leads.  ? ?Last Echo was performed in March showing HFrEF with biventricular  dysfunction. LV had regional wall motion abnormalities and severe inferior dyskinesis.  ? ?A/p: ?Progression of his biventricular systolic CHF - patient's heart failure symptoms are not well controlled on his current regi

## 2021-10-23 NOTE — Telephone Encounter (Signed)
Call to Admitting to schedule patient for Direct Admission.  Patient to be called at home and to return for Admission for CHF Exacerbation. ?

## 2021-10-24 ENCOUNTER — Other Ambulatory Visit: Payer: Self-pay

## 2021-10-24 ENCOUNTER — Inpatient Hospital Stay (HOSPITAL_COMMUNITY)
Admission: AD | Admit: 2021-10-24 | Discharge: 2021-10-26 | DRG: 291 | Disposition: A | Discharge status: Home / Self Care | Payer: 59 | Source: Ambulatory Visit | Attending: Student in an Organized Health Care Education/Training Program | Admitting: Student in an Organized Health Care Education/Training Program

## 2021-10-24 ENCOUNTER — Inpatient Hospital Stay (HOSPITAL_COMMUNITY): Payer: 59

## 2021-10-24 ENCOUNTER — Other Ambulatory Visit: Payer: Self-pay | Admitting: Internal Medicine

## 2021-10-24 ENCOUNTER — Telehealth: Payer: Self-pay | Admitting: *Deleted

## 2021-10-24 ENCOUNTER — Other Ambulatory Visit (HOSPITAL_COMMUNITY): Payer: 59

## 2021-10-24 ENCOUNTER — Encounter: Payer: Self-pay | Admitting: Internal Medicine

## 2021-10-24 DIAGNOSIS — F0390 Unspecified dementia without behavioral disturbance: Secondary | ICD-10-CM | POA: Diagnosis present

## 2021-10-24 DIAGNOSIS — Z95810 Presence of automatic (implantable) cardiac defibrillator: Secondary | ICD-10-CM

## 2021-10-24 DIAGNOSIS — Z8616 Personal history of COVID-19: Secondary | ICD-10-CM

## 2021-10-24 DIAGNOSIS — N179 Acute kidney failure, unspecified: Secondary | ICD-10-CM | POA: Diagnosis present

## 2021-10-24 DIAGNOSIS — E876 Hypokalemia: Secondary | ICD-10-CM | POA: Diagnosis present

## 2021-10-24 DIAGNOSIS — I130 Hypertensive heart and chronic kidney disease with heart failure and stage 1 through stage 4 chronic kidney disease, or unspecified chronic kidney disease: Principal | ICD-10-CM | POA: Diagnosis present

## 2021-10-24 DIAGNOSIS — Z820 Family history of epilepsy and other diseases of the nervous system: Secondary | ICD-10-CM

## 2021-10-24 DIAGNOSIS — Z952 Presence of prosthetic heart valve: Secondary | ICD-10-CM | POA: Diagnosis not present

## 2021-10-24 DIAGNOSIS — E785 Hyperlipidemia, unspecified: Secondary | ICD-10-CM | POA: Diagnosis present

## 2021-10-24 DIAGNOSIS — I4891 Unspecified atrial fibrillation: Secondary | ICD-10-CM | POA: Diagnosis present

## 2021-10-24 DIAGNOSIS — Z7901 Long term (current) use of anticoagulants: Secondary | ICD-10-CM | POA: Diagnosis not present

## 2021-10-24 DIAGNOSIS — N400 Enlarged prostate without lower urinary tract symptoms: Secondary | ICD-10-CM | POA: Diagnosis present

## 2021-10-24 DIAGNOSIS — N1832 Chronic kidney disease, stage 3b: Secondary | ICD-10-CM | POA: Diagnosis present

## 2021-10-24 DIAGNOSIS — I4892 Unspecified atrial flutter: Secondary | ICD-10-CM | POA: Diagnosis present

## 2021-10-24 DIAGNOSIS — I5023 Acute on chronic systolic (congestive) heart failure: Secondary | ICD-10-CM | POA: Diagnosis present

## 2021-10-24 DIAGNOSIS — Z8249 Family history of ischemic heart disease and other diseases of the circulatory system: Secondary | ICD-10-CM | POA: Diagnosis not present

## 2021-10-24 DIAGNOSIS — I872 Venous insufficiency (chronic) (peripheral): Secondary | ICD-10-CM | POA: Diagnosis present

## 2021-10-24 DIAGNOSIS — Z8673 Personal history of transient ischemic attack (TIA), and cerebral infarction without residual deficits: Secondary | ICD-10-CM

## 2021-10-24 DIAGNOSIS — I10 Essential (primary) hypertension: Secondary | ICD-10-CM | POA: Diagnosis present

## 2021-10-24 DIAGNOSIS — K219 Gastro-esophageal reflux disease without esophagitis: Secondary | ICD-10-CM | POA: Diagnosis present

## 2021-10-24 DIAGNOSIS — I440 Atrioventricular block, first degree: Secondary | ICD-10-CM | POA: Diagnosis present

## 2021-10-24 DIAGNOSIS — I509 Heart failure, unspecified: Principal | ICD-10-CM

## 2021-10-24 LAB — ECHOCARDIOGRAM COMPLETE
AR max vel: 2.61 cm2
AV Peak grad: 4.6 mmHg
Ao pk vel: 1.08 m/s
Area-P 1/2: 3.53 cm2
Calc EF: 38.9 %
Height: 68 in
MV VTI: 1.32 cm2
S' Lateral: 3.5 cm
Single Plane A2C EF: 42.5 %
Single Plane A4C EF: 38 %
Weight: 3104.08 oz

## 2021-10-24 LAB — BASIC METABOLIC PANEL
Anion gap: 12 (ref 5–15)
Anion gap: 16 — ABNORMAL HIGH (ref 5–15)
Anion gap: 10 (ref 5–15)
BUN: 19 mg/dL (ref 8–23)
BUN: 21 mg/dL (ref 8–23)
BUN: 23 mg/dL (ref 8–23)
CO2: 24 mmol/L (ref 22–32)
CO2: 20 mmol/L — ABNORMAL LOW (ref 22–32)
CO2: 27 mmol/L (ref 22–32)
Calcium: 8.9 mg/dL (ref 8.9–10.3)
Calcium: 9.2 mg/dL (ref 8.9–10.3)
Calcium: 9.3 mg/dL (ref 8.9–10.3)
Chloride: 102 mmol/L (ref 98–111)
Chloride: 102 mmol/L (ref 98–111)
Chloride: 101 mmol/L (ref 98–111)
Creatinine, Ser: 1.88 mg/dL — ABNORMAL HIGH (ref 0.61–1.24)
Creatinine, Ser: 2.22 mg/dL — ABNORMAL HIGH (ref 0.61–1.24)
Creatinine, Ser: 2.04 mg/dL — ABNORMAL HIGH (ref 0.61–1.24)
GFR, Estimated: 36 mL/min — ABNORMAL LOW (ref >60)
GFR, Estimated: 30 mL/min — ABNORMAL LOW (ref >60)
GFR, Estimated: 33 mL/min — ABNORMAL LOW (ref >60)
Glucose, Bld: 109 mg/dL — ABNORMAL HIGH (ref 70–99)
Glucose, Bld: 109 mg/dL — ABNORMAL HIGH (ref 70–99)
Glucose, Bld: 109 mg/dL — ABNORMAL HIGH (ref 70–99)
Potassium: 3.3 mmol/L — ABNORMAL LOW (ref 3.5–5.1)
Potassium: 3.5 mmol/L (ref 3.5–5.1)
Potassium: 3.6 mmol/L (ref 3.5–5.1)
Sodium: 138 mmol/L (ref 135–145)
Sodium: 138 mmol/L (ref 135–145)
Sodium: 138 mmol/L (ref 135–145)

## 2021-10-24 LAB — CBC WITH DIFFERENTIAL/PLATELET
Abs Immature Granulocytes: 0.02 10*3/uL (ref 0.00–0.07)
Basophils Absolute: 0.1 10*3/uL (ref 0.0–0.1)
Basophils Relative: 1 %
Eosinophils Absolute: 0.2 10*3/uL (ref 0.0–0.5)
Eosinophils Relative: 3 %
HCT: 29.9 % — ABNORMAL LOW (ref 39.0–52.0)
Hemoglobin: 8.4 g/dL — ABNORMAL LOW (ref 13.0–17.0)
Immature Granulocytes: 0 %
Lymphocytes Relative: 24 %
Lymphs Abs: 1.7 10*3/uL (ref 0.7–4.0)
MCH: 20.6 pg — ABNORMAL LOW (ref 26.0–34.0)
MCHC: 28.1 g/dL — ABNORMAL LOW (ref 30.0–36.0)
MCV: 73.3 fL — ABNORMAL LOW (ref 80.0–100.0)
Monocytes Absolute: 1.1 10*3/uL — ABNORMAL HIGH (ref 0.1–1.0)
Monocytes Relative: 16 %
Neutro Abs: 3.9 10*3/uL (ref 1.7–7.7)
Neutrophils Relative %: 56 %
Platelets: 433 10*3/uL — ABNORMAL HIGH (ref 150–400)
RBC: 4.08 MIL/uL — ABNORMAL LOW (ref 4.22–5.81)
RDW: 19.5 % — ABNORMAL HIGH (ref 11.5–15.5)
WBC: 7 10*3/uL (ref 4.0–10.5)
nRBC: 0 % (ref 0.0–0.2)

## 2021-10-24 LAB — TROPONIN I (HIGH SENSITIVITY)
Troponin I (High Sensitivity): 33 ng/L — ABNORMAL HIGH (ref <18)
Troponin I (High Sensitivity): 80 ng/L — ABNORMAL HIGH (ref <18)
Troponin I (High Sensitivity): 39 ng/L — ABNORMAL HIGH (ref <18)

## 2021-10-24 LAB — MAGNESIUM
Magnesium: 1.6 mg/dL — ABNORMAL LOW (ref 1.7–2.4)
Magnesium: 1.6 mg/dL — ABNORMAL LOW (ref 1.7–2.4)
Magnesium: 1.9 mg/dL (ref 1.7–2.4)

## 2021-10-24 LAB — PROTIME-INR
INR: 1.7 — ABNORMAL HIGH (ref 0.8–1.2)
Prothrombin Time: 20 seconds — ABNORMAL HIGH (ref 11.4–15.2)

## 2021-10-24 MED ORDER — WARFARIN - PHARMACIST DOSING INPATIENT: Freq: Every day | Status: DC

## 2021-10-24 MED ORDER — FUROSEMIDE 10 MG/ML IJ SOLN
40.0000 mg | Freq: Once | INTRAMUSCULAR | Status: AC
  Administered 2021-10-24: 40 mg via INTRAVENOUS
  Filled 2021-10-24: qty 4

## 2021-10-24 MED ORDER — ATORVASTATIN CALCIUM 10 MG PO TABS
20.0000 mg | ORAL_TABLET | Freq: Every evening | ORAL | Status: DC
  Administered 2021-10-24 – 2021-10-26 (×3): 20 mg via ORAL
  Filled 2021-10-24 (×3): qty 2

## 2021-10-24 MED ORDER — FUROSEMIDE 10 MG/ML IJ SOLN: 40.0000 mg | Freq: Every day | INTRAMUSCULAR | Status: DC

## 2021-10-24 MED ORDER — SODIUM CHLORIDE 0.9 % IV SOLN: 250.0000 mL | INTRAVENOUS | Status: DC | PRN

## 2021-10-24 MED ORDER — PANTOPRAZOLE SODIUM 40 MG PO TBEC
40.0000 mg | DELAYED_RELEASE_TABLET | Freq: Every day | ORAL | Status: DC
  Administered 2021-10-24 – 2021-10-26 (×3): 40 mg via ORAL
  Filled 2021-10-24 (×3): qty 1

## 2021-10-24 MED ORDER — FUROSEMIDE 10 MG/ML IJ SOLN: 80.0000 mg | Freq: Two times a day (BID) | INTRAMUSCULAR | Status: DC

## 2021-10-24 MED ORDER — MAGNESIUM SULFATE IN D5W 1-5 GM/100ML-% IV SOLN
1.0000 g | Freq: Once | INTRAVENOUS | Status: AC
  Administered 2021-10-24: 1 g via INTRAVENOUS
  Filled 2021-10-24: qty 100

## 2021-10-24 MED ORDER — FUROSEMIDE 10 MG/ML IJ SOLN: 40.0000 mg | Freq: Two times a day (BID) | INTRAMUSCULAR | Status: DC

## 2021-10-24 MED ORDER — SODIUM CHLORIDE 0.9% FLUSH
3.0000 mL | Freq: Two times a day (BID) | INTRAVENOUS | Status: DC
  Administered 2021-10-24 – 2021-10-26 (×5): 3 mL via INTRAVENOUS

## 2021-10-24 MED ORDER — TAMSULOSIN HCL 0.4 MG PO CAPS
0.8000 mg | ORAL_CAPSULE | Freq: Every day | ORAL | Status: DC
  Administered 2021-10-25 – 2021-10-26 (×2): 0.8 mg via ORAL
  Filled 2021-10-24 (×2): qty 2

## 2021-10-24 MED ORDER — WARFARIN SODIUM 5 MG PO TABS
5.0000 mg | ORAL_TABLET | Freq: Once | ORAL | Status: AC
  Administered 2021-10-24: 5 mg via ORAL
  Filled 2021-10-24: qty 1

## 2021-10-24 MED ORDER — POTASSIUM CHLORIDE CRYS ER 20 MEQ PO TBCR
40.0000 meq | EXTENDED_RELEASE_TABLET | Freq: Two times a day (BID) | ORAL | Status: AC
  Administered 2021-10-24 (×2): 40 meq via ORAL
  Filled 2021-10-24 (×2): qty 2

## 2021-10-24 MED ORDER — MAGNESIUM SULFATE 2 GM/50ML IV SOLN
2.0000 g | Freq: Once | INTRAVENOUS | Status: AC
  Administered 2021-10-24: 2 g via INTRAVENOUS
  Filled 2021-10-24: qty 50

## 2021-10-24 MED ORDER — ACETAMINOPHEN 325 MG PO TABS: 650.0000 mg | ORAL_TABLET | ORAL | Status: DC | PRN

## 2021-10-24 MED ORDER — SODIUM CHLORIDE 0.9% FLUSH: 3.0000 mL | INTRAVENOUS | Status: DC | PRN

## 2021-10-24 NOTE — TOC Progression Note (Signed)
Transition of Care (TOC) - Progression Note  ? ? ?Patient Details  ?Name: [name] ?MRN: [id number] ?Date of Birth: [birth year] ? ?Transition of Care (TOC) CM/SW Contact  ?Yanni Ruberg Wynn Banker, LCSWA ?Phone Number: ?10/24/2021, 11:41 AM ? ?Clinical Narrative:    ? ?Transition of Care (TOC) Screening Note ? ? ?Patient Details  ?Name: [name] ?Date of Birth: April 16, 1946 ? ? ?Transition of Care (TOC) CM/SW Contact:    ?Ivette Loyal, LCSWA ?Phone Number: ?10/24/2021, 11:42 AM ? ? ? ?Transition of Care Department [facility]) has reviewed patient and no TOC needs have been identified at this time. We will continue to monitor patient advancement through interdisciplinary progression rounds. If new patient transition needs arise, please place a TOC consult. ?  ? ? ?  ?  ? ?Expected Discharge Plan and Services ?  ?  ?  ?  ?  ?                ?  ?  ?  ?  ?  ?  ?  ?  ?  ?  ? ? ?Social Determinants of Health (SDOH) Interventions ?  ? ?Readmission Risk Interventions ?   ? View : No data to display.  ?  ?  ?  ? ? ?

## 2021-10-24 NOTE — Evaluation (Signed)
Physical Therapy Evaluation ?Patient Details ?Name: [name] ?MRN: [id number] ?DOB: [birth year] ?Today's Date: 10/24/2021 ? ?History of Present Illness ? Pt is a 76 y/o male admitted secondary to CHF exacerbation. PMH includes  A-fib, CHF, pacemaker, HTN,, CKD.  ?Clinical Impression ? Pt admitted secondary to problem above with deficits below. Pt requiring min guard for mobility tasks using cane. During mobility, monitor alerted for short runs of Vtach X2. Notified RN. Anticipate pt will progress well and will likely not need follow up PT at d/c. Will continue to follow acutely.    ?   ? ?Recommendations for follow up therapy are one component of a multi-disciplinary discharge planning process, led by the attending physician.  Recommendations may be updated based on patient status, additional functional criteria and insurance authorization. ? ?Follow Up Recommendations No PT follow up ? ?  ?Assistance Recommended at Discharge Intermittent Supervision/Assistance  ?Patient can return home with the following ? Assistance with cooking/housework;Help with stairs or ramp for entrance;Assist for transportation ? ?  ?Equipment Recommendations None recommended by PT  ?Recommendations for Other Services ?    ?  ?Functional Status Assessment Patient has had a recent decline in their functional status and demonstrates the ability to make significant improvements in function in a reasonable and predictable amount of time.  ? ?  ?Precautions / Restrictions Precautions ?Precautions: Fall ?Restrictions ?Weight Bearing Restrictions: No  ? ?  ? ?Mobility ? Bed Mobility ?Overal bed mobility: Modified Independent ?  ?  ?  ?  ?  ?  ?  ?  ? ?Transfers ?Overall transfer level: Needs assistance ?Equipment used: Straight cane ?Transfers: Sit to/from Stand ?Sit to Stand: Min guard ?  ?  ?  ?  ?  ?General transfer comment: Min guard for safety ?  ? ?Ambulation/Gait ?Ambulation/Gait assistance: Min guard ?Gait Distance (Feet): 30  Feet ?Assistive device: Straight cane ?Gait Pattern/deviations: Step-through pattern, Decreased stride length ?Gait velocity: Decreased ?  ?  ?General Gait Details: Min guard for safety. No overt LOB noted. HR up to upper 100s during mobility, with monitor going off X2 for short runs of Vtach. RN notified and aware. ? ?Stairs ?  ?  ?  ?  ?  ? ?Wheelchair Mobility ?  ? ?Modified Rankin (Stroke Patients Only) ?  ? ?  ? ?Balance Overall balance assessment: Mild deficits observed, not formally tested ?  ?  ?  ?  ?  ?  ?  ?  ?  ?  ?  ?  ?  ?  ?  ?  ?  ?  ?   ? ? ? ?Pertinent Vitals/Pain Pain Assessment ?Pain Assessment: No/denies pain  ? ? ?Home Living Family/patient expects to be discharged to:: Private residence ?Living Arrangements: Children;Other relatives ?Available Help at Discharge: Family;Available 24 hours/day ?Type of Home: House ?Home Access: Stairs to enter ?Entrance Stairs-Rails: Right ?Entrance Stairs-Number of Steps: 5 ?  ?Home Layout: One level ?Home Equipment: Kasandra Knudsen - single point ?   ?  ?Prior Function Prior Level of Function : Independent/Modified Independent;Driving ?  ?  ?  ?  ?  ?  ?Mobility Comments: Uses cane for ambulation ?  ?  ? ? ?Hand Dominance  ?   ? ?  ?Extremity/Trunk Assessment  ? Upper Extremity Assessment ?Upper Extremity Assessment: Defer to OT evaluation ?  ? ?Lower Extremity Assessment ?Lower Extremity Assessment: Generalized weakness ?  ? ?Cervical / Trunk Assessment ?Cervical / Trunk Assessment: Normal  ?Communication  ?  Communication: No difficulties  ?Cognition Arousal/Alertness: Awake/alert ?Behavior During Therapy: [facility] for tasks assessed/performed ?Overall Cognitive Status: No family/caregiver present to determine baseline cognitive functioning ?  ?  ?  ?  ?  ?  ?  ?  ?  ?  ?  ?  ?  ?  ?  ?  ?  ?  ?  ? ?  ?General Comments   ? ?  ?Exercises    ? ?Assessment/Plan  ?  ?PT Assessment Patient needs continued PT services  ?PT Problem List Decreased strength;Decreased  balance;Decreased activity tolerance;Decreased mobility ? ?   ?  ?PT Treatment Interventions DME instruction;Gait training;Stair training;Functional mobility training;Therapeutic exercise;Therapeutic activities;Balance training;Patient/family education   ? ?PT Goals (Current goals can be found in the Care Plan section)  ?Acute Rehab PT Goals ?Patient Stated Goal: to go home ?PT Goal Formulation: With patient ?Time For Goal Achievement: 11/07/21 ?Potential to Achieve Goals: Good ? ?  ?Frequency Min 3X/week ?  ? ? ?Co-evaluation   ?  ?  ?  ?  ? ? ?  ?AM-PAC PT "6 Clicks" Mobility  ?Outcome Measure Help needed turning from your back to your side while in a flat bed without using bedrails?: None ?Help needed moving from lying on your back to sitting on the side of a flat bed without using bedrails?: None ?Help needed moving to and from a bed to a chair (including a wheelchair)?: A Little ?Help needed standing up from a chair using your arms (e.g., wheelchair or bedside chair)?: A Little ?Help needed to walk in hospital room?: A Little ?Help needed climbing 3-5 steps with a railing? : A Little ?6 Click Score: 20 ? ?  ?End of Session Equipment Utilized During Treatment: Gait belt ?Activity Tolerance: Patient tolerated treatment well ?Patient left: in bed;with call bell/phone within reach;with bed alarm set ?Nurse Communication: Mobility status ?PT Visit Diagnosis: Other abnormalities of gait and mobility (R26.89) ?  ? ?Time: [id number] ?PT Time Calculation (min) (ACUTE ONLY): 16 min ? ? ?Charges:   PT Evaluation ?$PT Eval Moderate Complexity: 1 Mod ?  ?  ?   ? ? ?Reuel Derby, PT, DPT  ?Acute Rehabilitation Services  ?Pager: [phone] ?Office: [phone] ? ? ?[place] ?10/24/2021, 1:37 PM ? ?

## 2021-10-24 NOTE — Assessment & Plan Note (Signed)
Patient presents for follow up for CHF. Patient has underlying CKD Stage 3b. In the last week I increased his torsemide from 20 mg daily to 40 mg daily. BMP was performed before and after dosage change. See below: ? ? ?  Latest Ref Rng & Units 10/23/2021  ?  4:30 PM 10/20/2021  ? 12:07 PM 09/30/2021  ? 12:01 PM  ?BMP  ?Glucose 70 - 99 mg/dL 110   101   109    ?BUN 8 - 23 mg/dL 20   20   19     ?Creatinine 0.61 - 1.24 mg/dL 1.92   1.68   1.67    ?BUN/Creat Ratio 10 - 24  12   11     ?Sodium 135 - 145 mmol/L 138   140   137    ?Potassium 3.5 - 5.1 mmol/L 3.3   3.3   3.7    ?Chloride 98 - 111 mmol/L 100   97   104    ?CO2 22 - 32 mmol/L 28   25   19     ?Calcium 8.9 - 10.3 mg/dL 9.2   9.2   9.2    ?  ? ?Patient has evidence of mild AKI with an increase in Cr form 1.6 to 1.9. GFR relatively unchanged. Mild hypokalemia. Patient directed admitted for further evaluation and managment.  ?

## 2021-10-24 NOTE — Telephone Encounter (Signed)
I agree

## 2021-10-24 NOTE — Progress Notes (Signed)
ANTICOAGULATION CONSULT NOTE - Initial Consult ? ?Pharmacy Consult for Warfarin  ?Indication:  history of mechanical MVR and atrial fibrillation ? ?No Known Allergies ? ?Patient Measurements: ?Height: 5\' 8"  (172.7 cm) ?Weight: 88 kg (194 lb 0.1 oz) ?IBW/kg (Calculated) : 68.4 ? ? ?Vital Signs: ?Temp: 98.5 ?F (36.9 ?C) (04/21 [id number]) ?Temp Source: Oral (04/21 [id number]) ?BP: 120/75 (04/21 0956) ?Pulse Rate: 93 (04/21 0956) ? ?Labs: ?Recent Labs  ?  10/23/21 ?1630 10/24/21 ?1115 10/24/21 ?1207  ?HGB  --  8.4*  --   ?HCT  --  29.9*  --   ?PLT  --  433*  --   ?LABPROT  --   --  20.0*  ?INR  --   --  1.7*  ?CREATININE 1.92* 1.88*  --   ? ? ?Estimated Creatinine Clearance: 35.5 mL/min (A) (by C-G formula based on SCr of 1.88 mg/dL (H)). ? ? ?Medical History: ?Past Medical History:  ?Diagnosis Date  ? Acute GI bleeding 06/27/2021  ? Anemia 01/29/2016  ? Atrial fibrillation ([place])   ? post op.  s/p dc-cv 04/2008. previously on amiodarone  ? Atrial flutter ([place])   ? atypical  ? AV block, 1st degree   ? .450 msec--progressive  ? 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  a- Echo 07/2008 Ef 55% mild peroprosthetic MVR with High transmitral gradient (mean 14).  b- normal coronaries by cath 12/2007  ? BPH (benign prostatic hyperplasia)   ? Cardiac resynchronization therapy pacemaker (CRT-P) in place 06/22/2011  ? CHF (congestive heart failure) ([place])   ? EF 35-40 % 2011 due to valvular disease and diastolic dysfunction  ? Chronic back pain   ? CKD (chronic kidney disease), stage III ([place])   ? COVID-19 virus infection 07/24/2020  ? Tested positive on July 16, 2020  ? Dysphagia   ? with normal barium swallow 09/2008  ? Endocarditis   ? GERD (gastroesophageal reflux disease)   ? Heavy alcohol use   ? history  ? History of atrial flutter 03/13/2008  ? History of atrial flutter - resolved.   ? History of GI bleed   ? Hypertension   ? IV drug abuse ([place])   ? history of  ? Lower GI bleed 01/2016  ? Memory  impairment 12/24/2015  ? Pacemaker 2010  ? ? ?Assessment: ? 76 y.o male taking Warfarin PTA for h/o mechanical mitral valve replacement and AFib . Goal INR 2.5-3.5 per  anticoagulation clinic note on 10/20/21.  ?Noted h/o acute GI bleed (06/27/21) ? ?STAT INR  1.7 today  (Goal INR 2.5-3.5) ? Hgb 8.4, hct 29.9, pltc 433 ? ?Warfarin PTA dose: pts friend Ms [name] who gives pt his meds  reported PTA warfarin dose:  1/2 tab (2.5 mg) daily except NONE qTues with last taken on 10/24/19.  Note that this is different dosage from [facility] visit note 10/20/21 when INR = 2.0 and  pharmacist instructed patient to take  1/2 tab (2.5 mg) daily except  take 1 tab (5 mg) qMon. Goal INR 2.5-3.5 ? ?H&P today indicates that hehas been admitted to hospital 3 times in the last 4 months for GI bleed, heart failure exacerbation, and TIA., and that [name] appears to have cognitive impairment currently. ? ?Goal of Therapy:  ?INR 2.5-3.5 ? ?  ?Plan:  ?Give Warfarin 5 mg today x1  (Goal INR =2.5-3.5) ?Daily INR ?Monitor for s/sx of bleeding  (has h/o GIB 06/27/21) ? ? ?Thank you  for allowing pharmacy to be part of this patients care team. ?[name], RPh ?Clinical Pharmacist ?[phone] ? ?10/24/2021,3:36 PM ? ?Please check AMION for all [place] phone numbers ?After 10:00 PM, call [place] [phone] ? ?

## 2021-10-24 NOTE — Progress Notes (Signed)
Internal Medicine Clinic Attending  Case discussed with Dr. Coe  At the time of the visit.  We reviewed the resident's history and exam and pertinent patient test results.  I agree with the assessment, diagnosis, and plan of care documented in the resident's note.  

## 2021-10-24 NOTE — Assessment & Plan Note (Addendum)
HPI: Patient presents for 1 week follow-up from her recent clinic visit for CHF.  At that time patient presented with significant signs and symptoms of heart failure.  He was instructed to double his dose of torsemide for 5 days and return to the clinic for further evaluation and management.  I am concerned the patient has some underlying cognitive deficits that may have complicated his medication management of the last week.  Therefore, I called his partner who states that he did double his dose of torsemide each day successfully.  She states that his shortness of breath has worsened over this time but continues to urinate appropriately. ? ?During his visit today, the patient found to have a 10 pound weight gain, increased lower extremity edema, exertional dyspnea, orthopnea, and paroxysmal nocturnal dyspnea. He denied any chest pain, palpitations, dizziness, diaphoresis, or presyncopal symptoms.  ? ?On exam patient had significant lower extremity edema with roughly 3+ to his knees, significant JVD, bibasilar expiratory crackles. Patient was not in respiratory distress.  ?EKG showed atrial fibrillation with normal rate. There is a new left axis deviation and new T-wave inversion and q-waves in the inferior leads.  ? ?Last Echo was performed in March showing HFrEF with biventricular dysfunction. LV had regional wall motion abnormalities and severe inferior dyskinesis.  ? ?A/p: ?Progression of his biventricular systolic CHF - patient's heart failure symptoms are not well controlled on his current regimen of torsemide.  It seems that he likely has some gut edema making p.o. diuretics challenging/ineffective. Additionally, patient has new EKG changes without overt STEMI/NSTEMI,  concerning for progression of an ischemic process. Therefore, he will likely need a hospital admission for IV diuretics to better control his symptoms. ?-Stat BMP & Mg ?-We will call the hospital for direct admission ?- Will likely need IV  diuresis  ?- Considering patients recent clinical course, it is not unreasonable to consult palliative care while admitted to assist with further resources once discharged.  ?

## 2021-10-24 NOTE — Progress Notes (Signed)
INTERNAL MEDICINE TEACHING ATTENDING ADDENDUM   I agree with pharmacy recommendations as outlined in their note.   Kileigh Ortmann, MD  

## 2021-10-24 NOTE — H&P (Addendum)
? ?Date: 10/24/2021     ?Patient Name:  [name] MRN: [id number]  ?DOB: 08-03-1945 Age / Sex: 76 y.o., male   ?PCP: [name], MD    ?     ?Medical Service: Internal Medicine Teaching Service  ?     ?Attending Physician: Dr. Evette Doffing, Mallie Mussel, *    ?Intern (1st Contact): Christiana Fuchs, DO Pager: KM [phone]  ?Resident (2nd Contact): Sanjuana Letters, DO Pager: VK [phone]  ?     ?After Hours (After 5p/  First Contact Pager: [phone]  ?weekends / holidays): Second Contact Pager: [phone]  ? ?SUBJECTIVE:  ?Chief Complaint: Worsening Heart Failure ? ?History of Present Illness: [name] is a 76 y.o. male with PMH of atrial flutter/fibrillation and mechanical mitral valve on Coumadin, 1st degree AVB, BPH, HFrEF (35-40%), CKD IIIa, HTN, ICD placement,  who was directly admitted from clinic for heart failure exacerbation. ? ?Patient has been following in the outpatient setting for signs and symptoms of worsening heart failure.  He was seen in the clinic on 10/20/2021 where his Demadex was increased from 20 mg to 40 mg twice daily for 5 days and follow-up.  On 10/23/2021, he was seen back and was found to have an elevated JVD, he was 10 pounds over his dry weight, 3+ pitting edema to the knees bilaterally, and bibasilar crackles.  Given that he was failing outpatient treatment, it was recommended that he be directly admitted to [facility]. ? ?On evaluation today, [name] states that he has been having difficulty with sleeping and has noticed increased swelling in his feet and legs which is painful.  Swollen feet and legs makes it hard for him to ambulate. He notes that he has been difficult to fall asleep lying flat, and has to stand frequently to feel that he is able to breathe.  He additionally notes that he has been having a dry cough.  He denies any fevers, lightheadedness, syncope, nausea, vomiting, chest pain, dyspnea, abdominal pain, constipation, or diarrhea. ? ?Medications: ?No current  facility-administered medications on file prior to encounter.  ? ?Current Outpatient Medications on File Prior to Encounter  ?Medication Sig Dispense Refill  ? acetaminophen (TYLENOL) 500 MG tablet Take 1,000 mg by mouth every 6 (six) hours as needed (pain). (Patient not taking: Reported on 10/20/2021)    ? atorvastatin (LIPITOR) 20 MG tablet Take 1 tablet (20 mg total) by mouth every evening. 30 tablet 2  ? carvedilol (COREG) 6.25 MG tablet Take 1 tablet (6.25 mg total) by mouth 2 (two) times daily with a meal. 180 tablet 3  ? diclofenac Sodium (VOLTAREN) 1 % GEL APPLY 4 G TOPICALLY 4 TIMES DAILY (Patient not taking: Reported on 06/27/2021) 400 g 11  ? pantoprazole (PROTONIX) 40 MG tablet TAKE 1 TABLET (40 MG TOTAL) BY MOUTH TWICE A DAY BEFORE MEALS 180 tablet 1  ? tamsulosin (FLOMAX) 0.4 MG CAPS capsule Take 2 capsules (0.8 mg total) by mouth daily after breakfast. 180 capsule 3  ? torsemide (DEMADEX) 20 MG tablet TAKE 1 TABLET BY MOUTH TWICE A DAY 180 tablet 0  ? warfarin (COUMADIN) 5 MG tablet Take  1/2 tablet (2.5 mg) tablet by mouth on Tuesdays, Wednesdays, and Fridays.Then take whole tablet (5mg ) rest of days (Patient taking differently: Take 2.5-5 mg by mouth See admin instructions. Take 1 tablet (5 mg) on (Sun, Thurs, Sat) & Take 1/2 tablet (2.5 mg) all other days) 30 tablet 1  ? ? ?Past Medical History: ?Past  Medical History:  ?Diagnosis Date  ? Acute GI bleeding 06/27/2021  ? Anemia 01/29/2016  ? Atrial fibrillation ([place])   ? post op.  s/p dc-cv 04/2008. previously on amiodarone  ? Atrial flutter ([place])   ? atypical  ? AV block, 1st degree   ? .450 msec--progressive  ? 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  a- Echo 07/2008 Ef 55% mild peroprosthetic MVR with High transmitral gradient (mean 14).  b- normal coronaries by cath 12/2007  ? BPH (benign prostatic hyperplasia)   ? Cardiac resynchronization therapy pacemaker (CRT-P) in place 06/22/2011  ? CHF (congestive heart  failure) ([place])   ? EF 35-40 % 2011 due to valvular disease and diastolic dysfunction  ? Chronic back pain   ? CKD (chronic kidney disease), stage III ([place])   ? COVID-19 virus infection 07/24/2020  ? Tested positive on July 16, 2020  ? Dysphagia   ? with normal barium swallow 09/2008  ? Endocarditis   ? GERD (gastroesophageal reflux disease)   ? Heavy alcohol use   ? history  ? History of atrial flutter 03/13/2008  ? History of atrial flutter - resolved.   ? History of GI bleed   ? Hypertension   ? IV drug abuse ([place])   ? history of  ? Lower GI bleed 01/2016  ? Memory impairment 12/24/2015  ? Pacemaker 2010  ? ? ?Social:  ?Lives - [address] ?[place],  [place] [address],  ?Social History: Mr. Mcgahee lives with his friend, daughter and 5 grandchildren. He takes care of his own ADLs/IADLs. He is followed in the [facility]. Denies EtOH or illicit drug use. ? ?Family History: ?Family History  ?Problem Relation Age of Onset  ? Coronary artery disease Mother   ? Cancer Father   ?     GI  ? Cancer Brother   ?     Lung  ?Family History: Two brothers had cancer of unknown type.  History of Alzheimer's in brother and father. ? ?Allergies: ?Allergies as of 10/23/2021  ? (No Known Allergies)  ? ? ?Review of Systems: ?A complete ROS was negative except as per HPI.  ? ?OBJECTIVE:  ?Physical Exam: ?Blood pressure 120/75, pulse 93, temperature 98.5 ?F (36.9 ?C), temperature source Oral, resp. rate 18, height 5\' 8"  (1.727 m), weight 88 kg, SpO2 98 %. ?Physical Exam ?Constitutional:   ?   General: He is not in acute distress. ?   Appearance: Normal appearance. He is not diaphoretic.  ?   Comments: Resting comfortably in bed, fully conversant, NAD  ?Cardiovascular:  ?   Rate and Rhythm: Normal rate and regular rhythm.  ?   Pulses: Normal pulses.  ?   Heart sounds:  ?  No friction rub. No gallop.  ?   Comments: Systolic click appreciated, PD pulses 2+ bilaterally. ?Pulmonary:  ?   Effort: Pulmonary effort is normal. No respiratory distress.  ?    Breath sounds: No wheezing.  ?   Comments:  Basilar crackles appreciated in the lower lung fields bilaterally. ?Chest:  ?   Chest wall: No tenderness.  ?Abdominal:  ?   General: Abdomen is flat. Bowel sounds are normal. There is no distension.  ?   Palpations: Abdomen is soft.  ?   Tenderness: There is no abdominal tenderness. There is no guarding.  ?Musculoskeletal:     ?   General: No tenderness or signs of injury.  ?   Right lower  leg: Edema present.  ?   Left lower leg: Edema present.  ?   Comments: Nonpitting edema up to the knees bilaterally, 2+ pitting edema gravity dependent regions including the thighs bilaterally.  ?Skin: ?   Comments: Upper and lower extremities warm, no rashes, erythema noted.  ?Neurological:  ?   Mental Status: He is alert and oriented to person, place, and time.  ?Psychiatric:     ?   Mood and Affect: Mood normal.     ?   Behavior: Behavior normal.  ? ? ?Pertinent Labs: ?CBC ?   ?Component Value Date/Time  ? WBC 7.0 10/24/2021 1115  ? RBC 4.08 (L) 10/24/2021 1115  ? HGB 8.4 (L) 10/24/2021 1115  ? HGB 16.4 01/04/2020 1046  ? HCT 29.9 (L) 10/24/2021 1115  ? HCT 48.8 01/04/2020 1046  ? PLT 433 (H) 10/24/2021 1115  ? PLT 256 01/04/2020 1046  ? MCV 73.3 (L) 10/24/2021 1115  ? MCV 89 01/04/2020 1046  ? MCH 20.6 (L) 10/24/2021 1115  ? MCHC 28.1 (L) 10/24/2021 1115  ? RDW 19.5 (H) 10/24/2021 1115  ? RDW 13.7 01/04/2020 1046  ? LYMPHSABS 1.7 10/24/2021 1115  ? LYMPHSABS 1.6 01/04/2020 1046  ? MONOABS 1.1 (H) 10/24/2021 1115  ? EOSABS 0.2 10/24/2021 1115  ? EOSABS 0.1 01/04/2020 1046  ? BASOSABS 0.1 10/24/2021 1115  ? BASOSABS 0.1 01/04/2020 1046  ?  ? ?CMP  ?   ?Component Value Date/Time  ? NA 138 10/23/2021 1630  ? NA 140 10/20/2021 1207  ? K 3.3 (L) 10/23/2021 1630  ? CL 100 10/23/2021 1630  ? CO2 28 10/23/2021 1630  ? GLUCOSE 110 (H) 10/23/2021 1630  ? BUN 20 10/23/2021 1630  ? BUN 20 10/20/2021 1207  ? CREATININE 1.92 (H) 10/23/2021 1630  ? CREATININE 1.37 (H) 12/21/2014 1612  ? CALCIUM 9.2  10/23/2021 1630  ? CALCIUM 8.9 06/26/2008 2248  ? PROT 6.7 09/15/2021 1335  ? PROT 7.4 01/04/2020 1046  ? ALBUMIN 3.6 09/15/2021 1335  ? ALBUMIN 4.4 01/04/2020 1046  ? AST 30 09/15/2021 1335  ? ALT 17 03/13/

## 2021-10-24 NOTE — Assessment & Plan Note (Signed)
Screening ABIs performed today and were normal at 1.39 for left leg and 1.38 for right leg. ?

## 2021-10-24 NOTE — Assessment & Plan Note (Addendum)
HPI: Patient presents for further evaluation management of his underlying cognitive impairment. On evaluation today, patient had difficulty remembering how he made it to the clinic.  MoCA was performed showing significant difficulty with attention language fluency, delayed recall, and orientation.  His overall MoCA score was 16/30 ? ?A/P:      ?Significant cognitive impairment indicative of dementia -primary domains involved include memory and attention.  His cognitive impairment is invariably impacts his daily living making him incapable of complete caring for himself.  Patient does not have any evidence of delirium or other underlying behavioral health conditions that could be affecting his cognition.  Patient had a recent CT of his head without significant abnormalities and some flow irregularities in the posterior cerebral artery.  ? ?Patient's Clinical dementia rating is as follows - Memory 1 (Mild); Orientation 2 (Moderate); Judgment/Problem Solving 0.5 (Questionable); Community Affairs 2 (Moderate); Home/Hobbies 0.5 (Questionable); Personal Care 0.5-1 (Mild). ? ?- Will refer to case worker for additional resources  ?- Will refer to Neurology for additional testing  ?- Could benefit form PT/OT for exercise therapy. ?

## 2021-10-24 NOTE — Assessment & Plan Note (Signed)
EKG today shows atrial fibrillation with normal rate. I am not sure of the chronicity however, the patient had an EKG last month showing Afib.  CHA2DS2-VASc score of 7.  Patient is already on warfarin for mechanical mitral valve.  However, patient did have a recent history of GI bleed.  I am unsure of the significance of this bleed. ? ?

## 2021-10-24 NOTE — Telephone Encounter (Signed)
Call from Robin from Patient Placement unable to contact patient about available bed for admission.  Had tried numbers given as contact.  Call to caregiver for patient and give her information about the admission.  To bring patient to Admitting as soon as possible for admission to the hospital. ?

## 2021-10-25 ENCOUNTER — Inpatient Hospital Stay (HOSPITAL_COMMUNITY): Payer: 59

## 2021-10-25 ENCOUNTER — Encounter (HOSPITAL_COMMUNITY): Payer: Self-pay | Admitting: Student in an Organized Health Care Education/Training Program

## 2021-10-25 ENCOUNTER — Other Ambulatory Visit: Payer: Self-pay

## 2021-10-25 LAB — BASIC METABOLIC PANEL
Anion gap: 10 (ref 5–15)
Anion gap: 10 (ref 5–15)
BUN: 22 mg/dL (ref 8–23)
BUN: 23 mg/dL (ref 8–23)
CO2: 25 mmol/L (ref 22–32)
CO2: 25 mmol/L (ref 22–32)
Calcium: 9 mg/dL (ref 8.9–10.3)
Calcium: 9.5 mg/dL (ref 8.9–10.3)
Chloride: 102 mmol/L (ref 98–111)
Chloride: 102 mmol/L (ref 98–111)
Creatinine, Ser: 1.88 mg/dL — ABNORMAL HIGH (ref 0.61–1.24)
Creatinine, Ser: 1.87 mg/dL — ABNORMAL HIGH (ref 0.61–1.24)
GFR, Estimated: 36 mL/min — ABNORMAL LOW (ref >60)
GFR, Estimated: 37 mL/min — ABNORMAL LOW (ref >60)
Glucose, Bld: 101 mg/dL — ABNORMAL HIGH (ref 70–99)
Glucose, Bld: 99 mg/dL (ref 70–99)
Potassium: 3.5 mmol/L (ref 3.5–5.1)
Potassium: 4.4 mmol/L (ref 3.5–5.1)
Sodium: 137 mmol/L (ref 135–145)
Sodium: 137 mmol/L (ref 135–145)

## 2021-10-25 LAB — CBC
HCT: 30.5 % — ABNORMAL LOW (ref 39.0–52.0)
Hemoglobin: 8.7 g/dL — ABNORMAL LOW (ref 13.0–17.0)
MCH: 20.7 pg — ABNORMAL LOW (ref 26.0–34.0)
MCHC: 28.5 g/dL — ABNORMAL LOW (ref 30.0–36.0)
MCV: 72.4 fL — ABNORMAL LOW (ref 80.0–100.0)
Platelets: 460 10*3/uL — ABNORMAL HIGH (ref 150–400)
RBC: 4.21 MIL/uL — ABNORMAL LOW (ref 4.22–5.81)
RDW: 19.8 % — ABNORMAL HIGH (ref 11.5–15.5)
WBC: 6.6 10*3/uL (ref 4.0–10.5)
nRBC: 0.3 % — ABNORMAL HIGH (ref 0.0–0.2)

## 2021-10-25 LAB — GLUCOSE, CAPILLARY: Glucose-Capillary: 144 mg/dL — ABNORMAL HIGH (ref 70–99)

## 2021-10-25 LAB — PROTIME-INR
INR: 1.7 — ABNORMAL HIGH (ref 0.8–1.2)
INR: 1.5 — ABNORMAL HIGH (ref 0.8–1.2)
Prothrombin Time: 20 seconds — ABNORMAL HIGH (ref 11.4–15.2)
Prothrombin Time: 18.3 seconds — ABNORMAL HIGH (ref 11.4–15.2)

## 2021-10-25 LAB — APTT: aPTT: 34 seconds (ref 24–36)

## 2021-10-25 LAB — MAGNESIUM: Magnesium: 2.3 mg/dL (ref 1.7–2.4)

## 2021-10-25 MED ORDER — POTASSIUM CHLORIDE 20 MEQ PO PACK: 40.0000 meq | PACK | Freq: Two times a day (BID) | ORAL | Status: DC

## 2021-10-25 MED ORDER — FUROSEMIDE 10 MG/ML IJ SOLN
40.0000 mg | Freq: Two times a day (BID) | INTRAMUSCULAR | Status: DC
  Administered 2021-10-25: 40 mg via INTRAVENOUS
  Filled 2021-10-25: qty 4

## 2021-10-25 MED ORDER — FUROSEMIDE 10 MG/ML IJ SOLN
80.0000 mg | Freq: Two times a day (BID) | INTRAMUSCULAR | Status: AC
  Administered 2021-10-25: 80 mg via INTRAVENOUS
  Filled 2021-10-25: qty 8

## 2021-10-25 MED ORDER — POTASSIUM CHLORIDE 20 MEQ PO PACK
40.0000 meq | PACK | Freq: Two times a day (BID) | ORAL | Status: AC
  Administered 2021-10-25: 40 meq via ORAL
  Filled 2021-10-25: qty 2

## 2021-10-25 MED ORDER — CARVEDILOL 6.25 MG PO TABS
6.2500 mg | ORAL_TABLET | Freq: Two times a day (BID) | ORAL | Status: DC
  Administered 2021-10-25 – 2021-10-26 (×4): 6.25 mg via ORAL
  Filled 2021-10-25 (×4): qty 1

## 2021-10-25 MED ORDER — ENOXAPARIN SODIUM 80 MG/0.8ML IJ SOSY
80.0000 mg | PREFILLED_SYRINGE | Freq: Two times a day (BID) | INTRAMUSCULAR | Status: DC
  Administered 2021-10-25 – 2021-10-26 (×3): 80 mg via SUBCUTANEOUS
  Filled 2021-10-25 (×3): qty 0.8

## 2021-10-25 MED ORDER — WARFARIN SODIUM 5 MG PO TABS
5.0000 mg | ORAL_TABLET | Freq: Once | ORAL | Status: AC
  Administered 2021-10-25: 5 mg via ORAL
  Filled 2021-10-25: qty 1

## 2021-10-25 NOTE — Evaluation (Signed)
Occupational Therapy Evaluation ?Patient Details ?Name: [name] ?MRN: [id number] ?DOB: 09-18-1945 ?Today's Date: 10/25/2021 ? ? ?History of Present Illness Pt is a 76 y/o male admitted secondary to CHF exacerbation. PMH includes  A-fib, CHF, pacemaker, HTN,, CKD.  ? ?Clinical Impression ?  ?Patient evaluated by Occupational Therapy with no further acute OT needs identified. All education has been completed and the patient has no further questions. See below for any follow-up Occupational Therapy or equipment needs. OT is signing off. Thank you for this referral. ?  ?   ? ?Recommendations for follow up therapy are one component of a multi-disciplinary discharge planning process, led by the attending physician.  Recommendations may be updated based on patient status, additional functional criteria and insurance authorization.  ? ?Follow Up Recommendations ? No OT follow up  ?  ?Assistance Recommended at Discharge Intermittent Supervision/Assistance  ?Patient can return home with the following Assist for transportation;Assistance with cooking/housework;A little help with bathing/dressing/bathroom ? ?  ?Functional Status Assessment ? Patient has not had a recent decline in their functional status  ?Equipment Recommendations ? None recommended by OT  ?  ?Recommendations for Other Services   ? ? ?  ?Precautions / Restrictions Precautions ?Precautions: Fall ?Precaution Comments: Strict fluid restrictions ?Restrictions ?Weight Bearing Restrictions: No  ? ?  ? ?Mobility Bed Mobility ?Overal bed mobility: Modified Independent ?  ?  ?  ?  ?  ?  ?  ?  ? ?Transfers ?Overall transfer level: Modified independent ?  ?  ?  ?  ?  ?  ?  ?  ?  ?  ? ?  ?Balance Overall balance assessment: Mild deficits observed, not formally tested ?  ?  ?  ?  ?  ?  ?  ?  ?  ?  ?  ?  ?  ?  ?  ?  ?  ?  ?   ? ?ADL either performed or assessed with clinical judgement  ? ?ADL Overall ADL's : At baseline ?  ?  ?  ?  ?  ?  ?  ?  ?  ?  ?  ?  ?  ?  ?  ?  ?   ?  ?  ?General ADL Comments: Pt able to demonstrate LE dressing at EOB and in standing, bathroom functional mobility, UB ADLs at EOB. Pt reports that he has had no change in his ADL performance.  ? ? ? ?Vision   ?Vision Assessment?: No apparent visual deficits ?Additional Comments: Pt reports occassional "black dot on my left eye" that pt sees shooting across the room sometimes. Pt able to read clock on wall accurately.  ?   ?Perception Perception ?Perception: Within Functional Limits ?  ?Praxis Praxis ?Praxis: Intact ?  ? ?Pertinent Vitals/Pain Pain Assessment ?Pain Assessment: No/denies pain  ? ? ? ?Hand Dominance Right ?  ?Extremity/Trunk Assessment Upper Extremity Assessment ?Upper Extremity Assessment: Overall WFL for tasks assessed ?  ?Lower Extremity Assessment ?Lower Extremity Assessment: Generalized weakness ?  ?Cervical / Trunk Assessment ?Cervical / Trunk Assessment: Normal ?  ?Communication Communication ?Communication: No difficulties ?  ?Cognition Arousal/Alertness: Awake/alert ?Behavior During Therapy: [facility] for tasks assessed/performed ?Overall Cognitive Status: No family/caregiver present to determine baseline cognitive functioning ?  ?  ?  ?  ?  ?  ?  ?  ?  ?  ?  ?  ?  ?  ?  ?  ?General Comments: Initially stated year as 2003, able to self  correct with one cue. ?  ?  ?General Comments    ? ?  ?Exercises   ?  ?Shoulder Instructions    ? ? ?Home Living Family/patient expects to be discharged to:: Private residence ?Living Arrangements: Children;Other relatives ?Available Help at Discharge: Family;Available 24 hours/day ?Type of Home: House ?Home Access: Stairs to enter ?Entrance Stairs-Number of Steps: 5 ?Entrance Stairs-Rails: Right ?Home Layout: One level ?  ?  ?Bathroom Shower/Tub: Tub/shower unit ?  ?Bathroom Toilet: Standard ?  ?  ?Home Equipment: Gilmer Mor - single point ?  ?  ?  ? ?  ?Prior Functioning/Environment Prior Level of Function : Independent/Modified Independent ?  ?  ?  ?Physical Assist :  ADLs (physical) ?  ?ADLs (physical): IADLs ?Mobility Comments: Uses cane for ambulation ?ADLs Comments: Performs all BADLs Mod I.  Family provides transportation as pt does not drive, and family performs all household chores. ?  ? ?  ?  ?OT Problem List: Increased edema;Decreased activity tolerance (~1+ to 2+ edema to Bil Lower legs) ?  ?   ?OT Treatment/Interventions:    ?  ?OT Goals(Current goals can be found in the care plan section) Acute Rehab OT Goals ?Patient Stated Goal: To maintain independence ?OT Goal Formulation: All assessment and education complete, DC therapy  ?OT Frequency:   ?  ? ?Co-evaluation   ?  ?  ?  ?  ? ?  ?AM-PAC OT "6 Clicks" Daily Activity     ?Outcome Measure Help from another person eating meals?: None ?Help from another person taking care of personal grooming?: None ?Help from another person toileting, which includes using toliet, bedpan, or urinal?: None ?Help from another person bathing (including washing, rinsing, drying)?: A Little ?Help from another person to put on and taking off regular upper body clothing?: None ?Help from another person to put on and taking off regular lower body clothing?: None ?6 Click Score: 23 ?  ?End of Session Nurse Communication: Other (comment) (Request for lunch tray) ? ?Activity Tolerance: Patient tolerated treatment well ?Patient left: in bed;with call bell/phone within reach ? ?OT Visit Diagnosis: Pain ?Pain - Right/Left:  (Bilateral lower legs, intermittently. No pain for assessment.) ?Pain - part of body: Leg;Ankle and joints of foot  ?              ?Time: 7829-5621 ?OT Time Calculation (min): 11 min ?Charges:  OT General Charges ?$OT Visit: 1 Visit ?OT Evaluation ?$OT Eval Low Complexity: 1 Low ? ?Victorino Dike, OT ?Acute Rehab Services ?Office: [phone] ?10/25/2021 ? ?Theodoro Clock ?10/25/2021, 1:47 PM ?

## 2021-10-25 NOTE — Progress Notes (Addendum)
? ?HD#1 ?SUBJECTIVE:  ?Patient Summary: [name] is a 76 y.o. with a pertinent PMH of HFrEF (35-40%), dementia, mechanical mitral valve replacements on anticoagulation with warfarin, atrial fibrillation, chronic kidney disease stage IIIb, who presented due to weight gain, shortness of breath, and sleepiness and admitted for acute on chronic heart failure.  ? ?Overnight Events: none ? ?Interim History: Patient assessed at bedside this AM. He states that he is sleepy this morning.  He states that the swelling in his legs is about the same as yesterday.  Denies shortness of breath.  No other concerns at this time. ? ?OBJECTIVE:  ?Vital Signs: ?Vitals:  ? 10/24/21 0956 10/24/21 1619 10/24/21 2008 10/25/21 0503  ?BP: 120/75 119/80  (!) 108/58  ?Pulse: 93 90 97 97  ?Resp: 18 18 19 18   ?Temp: 98.5 ?F (36.9 ?C) 98 ?F (36.7 ?C) 99.1 ?F (37.3 ?C) 99 ?F (37.2 ?C)  ?TempSrc: Oral Oral Oral Oral  ?SpO2: 98% 99% 96% 97%  ?Weight: 88 kg   83 kg  ?Height: 5\' 8"  (1.727 m)     ? ?Supplemental O2: Room Air ?SpO2: 97 % ? ?Filed Weights  ? 10/24/21 0956 10/25/21 0503  ?Weight: 88 kg 83 kg  ? ? ? ?Intake/Output Summary (Last 24 hours) at 10/25/2021 0714 ?Last data filed at 10/25/2021 0545 ?Gross per 24 hour  ?Intake 920 ml  ?Output 3725 ml  ?Net -2805 ml  ? ?Net IO Since Admission: -2,805 mL [10/25/21 0714] ? ?Physical Exam: ?Physical Exam ?Constitutional:   ?   Comments: sleepy  ?HENT:  ?   Mouth/Throat:  ?   Mouth: Mucous membranes are moist.  ?Cardiovascular:  ?   Rate and Rhythm: Normal rate and regular rhythm.  ?   Pulses: Normal pulses.  ?   Heart sounds: Normal heart sounds.  ?   Comments: JVD to mandible ?Pulmonary:  ?   Effort: Pulmonary effort is normal.  ?   Breath sounds: Normal breath sounds.  ?Musculoskeletal:  ?   Comments: 1+ pitting edema to mid shins bilaterally  ?Skin: ?   General: Skin is warm and dry.  ?Neurological:  ?   Mental Status: Mental status is at baseline.  ?   ? ?Patient Lines/Drains/Airways Status    ? ? Active Line/Drains/Airways   ? ? Name Placement date Placement time Site Days  ? Peripheral IV 10/24/21 22 G Left Antecubital 10/24/21  1102  Antecubital  1  ? ?  ?  ? ?  ? ? ?Pertinent Labs: ? ?  Latest Ref Rng & Units 10/25/2021  ?  3:46 AM 10/24/2021  ? 11:15 AM 09/16/2021  ?  3:16 AM  ?CBC  ?WBC 4.0 - 10.5 K/uL 6.6   7.0   6.6    ?Hemoglobin 13.0 - 17.0 g/dL 8.7   8.4   9.7    ?Hematocrit 39.0 - 52.0 % 30.5   29.9   33.4    ?Platelets 150 - 400 K/uL 460   433   425    ? ? ? ?  Latest Ref Rng & Units 10/25/2021  ?  3:46 AM 10/24/2021  ?  8:48 PM 10/24/2021  ?  3:18 PM  ?CMP  ?Glucose 70 - 99 mg/dL 101   109   109    ?BUN 8 - 23 mg/dL 22   23   21     ?Creatinine 0.61 - 1.24 mg/dL 1.88   2.04   2.22    ?Sodium 135 -  145 mmol/L 137   138   138    ?Potassium 3.5 - 5.1 mmol/L 3.5   3.6   3.5    ?Chloride 98 - 111 mmol/L 102   101   102    ?CO2 22 - 32 mmol/L 25   27   20     ?Calcium 8.9 - 10.3 mg/dL 9.0   9.3   9.2    ? ? ?No results for input(s): GLUCAP in the last 72 hours.  ? ? ?ASSESSMENT/PLAN:  ?Assessment: ?Principal Problem: ?  Acute exacerbation of CHF (congestive heart failure) ([place]) ?Active Problems: ?  Essential hypertension ?  H/O mitral valve replacement with mechanical valve ?  Dementia ([place]) ?  Atrial fibrillation ([place]) ? ? ?[name] is a 76 y.o. with a pertinent PMH of HFrEF (35-40%), dementia, mechanical mitral valve replacements on anticoagulation with warfarin, atrial fibrillation, chronic kidney disease stage IIIb, who presented due to weight gain, shortness of breath, and sleepiness and admitted for acute on chronic heart failure. ? ?Plan: ?#Acute on Chronic HFrEF (EF 35-40% 09/10/21) Exacerbation: ?He diuresed well with IV Lasix 40 mg twice yesterday.  Urine output at 3.7 L.  On exam he has 1+ pitting edema to midshins bilaterally, lungs are clear to auscultation, and JVD to mandible.  His admission weight was 88 kg and weight today does not seem accurate at 83 kg.  He seems hypervolemic.  Repeat echo showed EF 35-40% with moderate LV dysfunction. I think that this exacerbation is most likely due to to medication nonadherence.  He has difficulty taking his medications and his friend who lives with him tries to be at outpatient appointments when she is able to, but she helps take care of multiple grandchildren.  Today we will have him work with physical therapy and Occupational Therapy as well as continue with diuresis. ?- IV Furosemide 80 mg IV BID ?- Trend BMP ?- Mag level ?- Keep K+ >4.0, Mg >2.0 ?- Strict I&Os ?- Daily Weights ?- Fluid restriction ?- Keep O2 sat >88 ?- continue carvedilol 6.25mg  BID ?-PT/ OT eval and treat ?-consider adding SGLT2/ spironolactone for GDMT at discharge, he was not able to tolerate ACE 2/2 to hypotension ?  ?#AKI on CKD 3a ?Baseline creatinine around 1.7-1.8.  BMP in clinic shows creatinine of 1.92, trending down today to 1.88. ?--Avoid nephrotoxicity ?--Daily BMP ?  ?#Hx A-fib/aflutter ?#Hx 1st-degree AVB ?#Hx MVR with mechanical valve ?Follows with Dr. Elie Confer at the INR clinic. INR goal of 2.5-3.5 due to mechanical valve. INR subtherapeutic today, bridging with lovenox. He does have a history of GI bleed in December. EGD at that time showed gastritis and single non-bleeding angiodysplastic lesion that was non-bleeding.  ?--F/u PT/INR ?-Daily CBC ?--Warfarin per pharmacy ?--Telemetry ?--Keep K+ >4.0, Mg >2.0 ?  ?#HTN ?BP on admission of 120/75. Home regimen includes Coreg 6.25 mg twice daily. Patient taken off lisinopril due to soft BPs and dizziness during last admission. ?- Continue coreg 6.25 BID ?  ?#Hx BPH ?Normal PSA during recent admission.  ?--Continue home Flomax ?  ?#Hx TIA  ?#Hyperlipidemia ?Patient was hospitalized for TIA 5 weeks ago.  Patient was started on a statin therapy due to LDL of 80 during the hospitalization. ?--Continue atorvastatin 20 mg daily ? ?Best Practice: ?Diet: Cardiac diet ?IVF: Fluids: none ?VTE: warfarin for mechanical mitral  valve ?Code: Full ?AB: none ?Therapy Recs: Pending ?Family Contact: voicemail left for Ms. Marshell Levan ?DISPO: Anticipated discharge in 1-2 days to Home pending  diuresis. ? ?Signature: ?Daleen Bo. Ruthie Berch, D.O.  ?Internal Medicine Resident, PGY-1 ?[facility]  ?Pager: [phone] ?7:14 AM, 10/25/2021  ? ?Please contact the on call pager after 5 pm and on weekends at [phone].  ?

## 2021-10-25 NOTE — Plan of Care (Incomplete)

## 2021-10-25 NOTE — Progress Notes (Signed)
ANTICOAGULATION CONSULT NOTE - Initial Consult ? ?Pharmacy Consult for Warfarin  ?Indication:  history of mechanical MVR and atrial fibrillation ? ?No Known Allergies ? ?Patient Measurements: ?Height: 5\' 8"  (172.7 cm) ?Weight: 83 kg (182 lb 15.7 oz) ?IBW/kg (Calculated) : 68.4 ? ? ?Vital Signs: ?Temp: 99 ?F (37.2 ?C) (04/22 0503) ?Temp Source: Oral (04/22 0503) ?BP: 96/71 (04/22 [id number]) ?Pulse Rate: 93 (04/22 0841) ? ?Labs: ?Recent Labs  ?  10/24/21 ?1115 10/24/21 ?1207 10/24/21 ?1518 10/24/21 ?1740 10/24/21 ?2048 10/25/21 ?0346  ?HGB 8.4*  --   --   --   --  8.7*  ?HCT 29.9*  --   --   --   --  30.5*  ?PLT 433*  --   --   --   --  460*  ?LABPROT  --  20.0*  --   --   --  20.0*  ?INR  --  1.7*  --   --   --  1.7*  ?CREATININE 1.88*  --  2.22*  --  2.04* 1.88*  ?TROPONINIHS  --   --  33* 80* 39*  --   ? ? ? ?Estimated Creatinine Clearance: 34.5 mL/min (A) (by C-G formula based on SCr of 1.88 mg/dL (H)). ? ? ?Medical History: ?Past Medical History:  ?Diagnosis Date  ? Acute GI bleeding 06/27/2021  ? Anemia 01/29/2016  ? Atrial fibrillation ([place])   ? post op.  s/p dc-cv 04/2008. previously on amiodarone  ? Atrial flutter ([place])   ? atypical  ? AV block, 1st degree   ? .450 msec--progressive  ? 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  a- Echo 07/2008 Ef 55% mild peroprosthetic MVR with High transmitral gradient (mean 14).  b- normal coronaries by cath 12/2007  ? BPH (benign prostatic hyperplasia)   ? Cardiac resynchronization therapy pacemaker (CRT-P) in place 06/22/2011  ? CHF (congestive heart failure) ([place])   ? EF 35-40 % 2011 due to valvular disease and diastolic dysfunction  ? Chronic back pain   ? CKD (chronic kidney disease), stage III ([place])   ? COVID-19 virus infection 07/24/2020  ? Tested positive on July 16, 2020  ? Dysphagia   ? with normal barium swallow 09/2008  ? Endocarditis   ? GERD (gastroesophageal reflux disease)   ? Heavy alcohol use   ? history  ? History of atrial  flutter 03/13/2008  ? History of atrial flutter - resolved.   ? History of GI bleed   ? Hypertension   ? IV drug abuse ([place])   ? history of  ? Lower GI bleed 01/2016  ? Memory impairment 12/24/2015  ? Pacemaker 2010  ? ? ?Assessment: ? 76 y.o male taking Warfarin PTA for h/o mechanical mitral valve replacement and AFib . Goal INR 2.5-3.5 per  anticoagulation clinic note on 10/20/21. Noted h/o acute GI bleed (06/27/21). Pharmacy consulted to dose warfarin and enoxaparin bridge.  ? ?INR  1.7 today  (Goal INR 2.5-3.5). Will start enoxaparin bridge given subtherapeutic INR. Hgb 8.7, plts 460, stable, no s/x of bleeding per IM team.  ? ?Warfarin PTA dose: pts friend [name] who gives pt his meds  reported PTA warfarin dose:  1/2 tab (2.5 mg) daily except NONE qTues with last taken on 10/24/19.  Note that this is different dosage from [facility] visit note 10/20/21 when INR = 2.0 and  pharmacist instructed patient to take  1/2 tab (2.5 mg) daily except  take  1 tab (5 mg) qMon. Goal INR 2.5-3.5 ? ?H&P today indicates that he has been admitted to hospital 3 times in the last 4 months for GI bleed, heart failure exacerbation, and TIA and that Mr. Tora Perches appears to have cognitive impairment currently. ? ?Goal of Therapy:  ?INR 2.5-3.5 ? ?Plan:  ?Give Warfarin 5 mg today x1  (Goal INR =2.5-3.5) ?Start enoxaparin 80 mg q12h ?Daily INR ?Monitor for s/sx of bleeding  (has h/o GIB 06/27/21) ? ?Cathrine Muster, PharmD ?PGY2 Cardiology Pharmacy Resident ?Phone: [phone] ?10/25/2021  10:50 AM ? ?Please check [email or web address] for unit-specific pharmacy phone numbers. ? ? ?

## 2021-10-26 LAB — CBC
HCT: 33.8 % — ABNORMAL LOW (ref 39.0–52.0)
Hemoglobin: 9.2 g/dL — ABNORMAL LOW (ref 13.0–17.0)
MCH: 20.2 pg — ABNORMAL LOW (ref 26.0–34.0)
MCHC: 27.2 g/dL — ABNORMAL LOW (ref 30.0–36.0)
MCV: 74.1 fL — ABNORMAL LOW (ref 80.0–100.0)
Platelets: 497 10*3/uL — ABNORMAL HIGH (ref 150–400)
RBC: 4.56 MIL/uL (ref 4.22–5.81)
RDW: 19.9 % — ABNORMAL HIGH (ref 11.5–15.5)
WBC: 6.6 10*3/uL (ref 4.0–10.5)
nRBC: 0 % (ref 0.0–0.2)

## 2021-10-26 LAB — BASIC METABOLIC PANEL
Anion gap: 12 (ref 5–15)
BUN: 29 mg/dL — ABNORMAL HIGH (ref 8–23)
CO2: 24 mmol/L (ref 22–32)
Calcium: 9.5 mg/dL (ref 8.9–10.3)
Chloride: 102 mmol/L (ref 98–111)
Creatinine, Ser: 2.08 mg/dL — ABNORMAL HIGH (ref 0.61–1.24)
GFR, Estimated: 32 mL/min — ABNORMAL LOW (ref >60)
Glucose, Bld: 126 mg/dL — ABNORMAL HIGH (ref 70–99)
Potassium: 3.5 mmol/L (ref 3.5–5.1)
Sodium: 138 mmol/L (ref 135–145)

## 2021-10-26 LAB — MAGNESIUM: Magnesium: 1.9 mg/dL (ref 1.7–2.4)

## 2021-10-26 LAB — PROTIME-INR
INR: 1.9 — ABNORMAL HIGH (ref 0.8–1.2)
Prothrombin Time: 21.2 seconds — ABNORMAL HIGH (ref 11.4–15.2)

## 2021-10-26 MED ORDER — POTASSIUM CHLORIDE 20 MEQ PO PACK
40.0000 meq | PACK | Freq: Once | ORAL | Status: AC
  Administered 2021-10-26: 40 meq via ORAL
  Filled 2021-10-26: qty 2

## 2021-10-26 MED ORDER — WARFARIN SODIUM 5 MG PO TABS
5.0000 mg | ORAL_TABLET | Freq: Once | ORAL | Status: AC
  Administered 2021-10-26: 5 mg via ORAL
  Filled 2021-10-26: qty 1

## 2021-10-26 MED ORDER — ENOXAPARIN SODIUM 80 MG/0.8ML IJ SOSY: 80.0000 mg | PREFILLED_SYRINGE | INTRAMUSCULAR | Status: DC

## 2021-10-26 MED ORDER — TORSEMIDE 20 MG PO TABS
20.0000 mg | ORAL_TABLET | Freq: Two times a day (BID) | ORAL | Status: DC
  Administered 2021-10-26: 20 mg via ORAL
  Filled 2021-10-26: qty 1

## 2021-10-26 MED ORDER — WARFARIN SODIUM 5 MG PO TABS: ORAL_TABLET | ORAL | 11 refills | Status: DC

## 2021-10-26 MED ORDER — MAGNESIUM SULFATE 2 GM/50ML IV SOLN
2.0000 g | Freq: Once | INTRAVENOUS | Status: AC
  Administered 2021-10-26: 2 g via INTRAVENOUS
  Filled 2021-10-26: qty 50

## 2021-10-26 NOTE — Plan of Care (Signed)
  Problem: Education: Goal: Ability to demonstrate management of disease process will improve Outcome: Progressing Goal: Ability to verbalize understanding of medication therapies will improve Outcome: Progressing   Problem: Activity: Goal: Capacity to carry out activities will improve Outcome: Progressing   Problem: Cardiac: Goal: Ability to achieve and maintain adequate cardiopulmonary perfusion will improve Outcome: Progressing   

## 2021-10-26 NOTE — Discharge Summary (Signed)
? ?Name: [name] ?MRN: [id number] ?DOB: 26-Feb-1946 76 y.o. ?PCP: Richard Cloud, MD ? ?Date of Admission: 10/24/2021  9:29 AM ?Date of Discharge: 10/26/21 ?Attending Physician: Dr. Oswaldo Hubbard ? ?Discharge Diagnosis: ?Principal Problem: ?  Acute exacerbation of CHF (congestive heart failure) (HCC) ?Active Problems: ?  Essential hypertension ?  H/O mitral valve replacement with mechanical valve ?  Dementia (HCC) ?  Atrial fibrillation (HCC) ?  ? ?Discharge Medications: ?Allergies as of 10/26/2021   ?No Known Allergies ?  ? ?  ?Medication List  ?  ? ?TAKE these medications   ? ?acetaminophen 500 MG tablet ?Commonly known as: TYLENOL ?Take 1,000 mg by mouth every 6 (six) hours as needed (pain). ?  ?atorvastatin 20 MG tablet ?Commonly known as: LIPITOR ?Take 1 tablet (20 mg total) by mouth every evening. ?  ?carvedilol 6.25 MG tablet ?Commonly known as: COREG ?Take 1 tablet (6.25 mg total) by mouth 2 (two) times daily with a meal. ?  ?diclofenac Sodium 1 % Gel ?Commonly known as: VOLTAREN ?APPLY 4 G TOPICALLY 4 TIMES DAILY ?  ?pantoprazole 40 MG tablet ?Commonly known as: PROTONIX ?TAKE 1 TABLET (40 MG TOTAL) BY MOUTH TWICE A DAY BEFORE MEALS ?  ?tamsulosin 0.4 MG Caps capsule ?Commonly known as: FLOMAX ?Take 2 capsules (0.8 mg total) by mouth daily after breakfast. ?  ?torsemide 20 MG tablet ?Commonly known as: DEMADEX ?TAKE 1 TABLET BY MOUTH TWICE A DAY ?What changed:  ?how much to take ?how to take this ?when to take this ?additional instructions ?  ?warfarin 5 MG tablet ?Commonly known as: Coumadin ?Take as directed. If you are unsure how to take this medication, talk to your nurse or doctor. ?Original instructions: Take warfarin 5 mg tab evening of 4/23 and follow-up in [facility] on Monday with Dr. Alexandria Hubbard. ?What changed:  ?how much to take ?how to take this ?when to take this ?additional instructions ?  ? ?  ? ? ?Disposition and follow-up:   ?[name] was discharged from [facility] in Stable condition.  At the hospital follow up visit please address: ? ?1.  Follow-up: ?a.  Acute on chronic heart failure- He weighed 88 kg at the clinic prior to admission.  He had adequate diuresis at 4 L.  His weight at discharge was 80.7 kg.  There seems to be a discrepancy between the scales in the clinic versus hospital weights.  His prior dry weight in the clinic was 84 kg.  Home dosing at torsemide 20 mg twice daily was restarted at discharge.  Consider adding SGLT2 inhibitor and low-dose spironolactone. ?  ? b.  Acute on chronic kidney function-creatinine at 2.22 on day of admission.  Following diuresis creatinine at 1.9.  ? ?c.  History of chemical mitral valve on warfarin-INR goal of 2.5-3.5.  He follows with Dr. Alexandria Hubbard INR clinic.  INR at 1.9 at discharge.  He received Lovenox dosing prior to leaving hospital.  I spoke with pharmacist in house and they recommended patient doing 5 mg warfarin dosing this evening and following up with INR clinic tomorrow.  I spoke with Ms. [name] and asked her to have patient come to clinic on Monday for his INR to be reassessed.  ? ? d. Venous insufficiency- recommended compression socks ? ?2.  Labs / imaging needed at time of follow-up: INR, BMP, CBC ? ?3.  Pending labs/ test needing follow-up: none ? ?4.  Medication Changes ? Started: Torsemide 20  mg twice daily ?  ?Follow-up Appointments: ?  Message sent to [name] front desk staff to help patient in scheduling appointment later this week ? ?Hospital Course by problem list: ?Acute exacerbation of heart failure with reduced ejection fraction ?Patient presented to [facility] for management of heart failure exacerbation after failing outpatient clinic treatment.  She has a history of dementia and receives help with medications from his friend, Ms. [name].  This is his second hospitalization for heart failure exacerbations within the last 3 months and I do think nonadherence is because of  these.  On initial exam he was noted to be short of breath with 2+ pitting edema in his legs.  His weight was noted to be 88 kg, which was about 4 kg up from 1 month ago.  He was given IV diuresis with Lasix and had adequate urine output of around 4 L.  He was given additional IV diuresis and urine output was less as well as creatinine trended up making me think that he was likely intravascularly dry.  TTE completed and showed EF of 35 to 40%.  Left ventricle moderately decreased function with regional wall motion abnormalities.  At discharge his breathing had improved but he still had some swelling in his lower extremities.  I do think that venous insufficiency is partially why he has lower extremity edema.  He was previously on lisinopril, but unable to tolerate this medication due to hypotension.  He would benefit from addition of GDMT with SGLT2 inhibitor and low-dose spironolactone at follow-up.  ? ?At discharge from hospital scales at 80.7 kg. ? ?History of mitral valve replacement with mechanical valve ?Atrial fibrillation ?INR goal of 2.5-3.5.  He follows with INR clinic at [facility].  He was given Lovenox injection prior to discharge and Ms. [name] was asked to bring patient to [facility] on Monday to have his INR reassessed.  I spoke with in-house pharmacist and she recommended 5 mg tablet of warfarin Sunday evening.  ? ?Hypertension ?Home medications include Coreg 6.25 mg twice daily.  Lisinopril was discontinued due to hypotension and dizziness in March. ? ?Dementia ?MoCA was completed in clinic in March, this was consistent with dementia.  He does have support at home from his friend, Ms. [name].  ? ?History of BPH ?Normal PSA on last admission.  Home medication including Flomax 0.8 mg once daily. ? ?Discharge Subjective: ?Assessed at bedside this AM.  He reports that he feels good today and is ready to go home.  He was able to eat his breakfast without problems.  We talked about getting some compression  socks to help with the swelling in his feet.  No other concerns at this time. ? ?Discharge Exam:   ?BP 106/75 (BP Location: Right Arm)   Pulse 67   Temp 98.3 ?F (36.8 ?C) (Oral)   Resp 19   Ht 5\' 8"  (1.727 m)   Wt 80.7 kg   SpO2 96%   BMI 27.05 kg/m?  ?Constitutional: Elderly man, sitting on side of bed finishing breakfast ?HENT: normocephalic, mucous membranes moist ?Eyes: conjunctiva non-erythematous ?Neck: supple ?Cardiovascular: regular rate and rhythm, no m/r/g ?Pulmonary/Chest: normal work of breathing on room air, lungs clear to auscultation bilaterally ?Abdominal: soft, non-tender, non-distended ?MSK: normal bulk and tone, 1+ pitting edema in lower extremities to midshin bilaterally ?Neurological: Oriented to self only at baseline ?Skin: warm and dry ?Psych: Normal mood and affect ? ?Pertinent Labs, Studies, and Procedures:  ? ?  Latest  Ref Rng & Units 10/26/2021  ?  4:07 AM 10/25/2021  ?  3:46 AM 10/24/2021  ? 11:15 AM  ?CBC  ?WBC 4.0 - 10.5 K/uL 6.6   6.6   7.0    ?Hemoglobin 13.0 - 17.0 g/dL 9.2   8.7   8.4    ?Hematocrit 39.0 - 52.0 % 33.8   30.5   29.9    ?Platelets 150 - 400 K/uL 497   460   433    ? ? ? ?  Latest Ref Rng & Units 10/26/2021  ?  4:07 AM 10/25/2021  ? 12:03 PM 10/25/2021  ?  3:46 AM  ?CMP  ?Glucose 70 - 99 mg/dL 878   99   676    ?BUN 8 - 23 mg/dL 29   23   22     ?Creatinine 0.61 - 1.24 mg/dL   7.20   9.47    ?Sodium 135 - 145 mmol/L 138   137   137    ?Potassium 3.5 - 5.1 mmol/L 3.5   4.4   3.5    ?Chloride 98 - 111 mmol/L 102   102   102    ?CO2 22 - 32 mmol/L 24   25   25     ?Calcium 8.9 - 10.3 mg/dL 9.5   9.5   9.0    ? ? ?X-ray chest PA and lateral ? ?Result Date: 10/25/2021 ?CLINICAL DATA:  Shortness of breath. EXAM: CHEST - 2 VIEW COMPARISON:  Chest two views 09/15/2021 09/09/2021, 08/25/2021 FINDINGS: Left chest wall cardiac pacer is seen with leads overlying the right atrium and right ventricle. Cardiac silhouette is again mildly to moderately enlarged. Mediastinal contours  are within normal limits. Mildly decreased lung volumes with right-greater-than-left basilar interstitial thickening. Mitral annular prosthesis is again seen. Right-greater-than-left basilar interstitial thi

## 2021-10-26 NOTE — Progress Notes (Signed)
Discharge instructions given. Patient verbalized understanding and all questions were answered. Awaiting family member to pick him up ?

## 2021-10-26 NOTE — Progress Notes (Signed)
ANTICOAGULATION CONSULT NOTE - Initial Consult ? ?Pharmacy Consult for Warfarin  ?Indication:  history of mechanical MVR and atrial fibrillation ? ?No Known Allergies ? ?Patient Measurements: ?Height: 5\' 8"  (172.7 cm) ?Weight: 80.7 kg (177 lb 14.4 oz) ?IBW/kg (Calculated) : 68.4 ? ? ?Vital Signs: ?Temp: 98.2 ?F (36.8 ?C) (04/22 2330) ?Temp Source: Oral (04/22 2330) ?BP: 97/60 (04/22 2330) ?Pulse Rate: 67 (04/22 2330) ? ?Labs: ?Recent Labs  ?  10/24/21 ?1115 10/24/21 ?1207 10/24/21 ?1518 10/24/21 ?1740 10/24/21 ?2048 10/25/21 ?0346 10/25/21 ?1203 10/26/21 ?0407  ?HGB 8.4*  --   --   --   --  8.7*  --  9.2*  ?HCT 29.9*  --   --   --   --  30.5*  --  33.8*  ?PLT 433*  --   --   --   --  460*  --  497*  ?APTT  --   --   --   --   --   --  34  --   ?LABPROT  --    < >  --   --   --  20.0* 18.3* 21.2*  ?INR  --    < >  --   --   --  1.7* 1.5* 1.9*  ?CREATININE 1.88*  --  2.22*  --  2.04* 1.88* 1.87* 2.08*  ?TROPONINIHS  --   --  33* 80* 39*  --   --   --   ? < > = values in this interval not displayed.  ? ? ? ?Estimated Creatinine Clearance: 28.8 mL/min (A) (by C-G formula based on SCr of 2.08 mg/dL (H)). ? ? ?Medical History: ?Past Medical History:  ?Diagnosis Date  ? Acute GI bleeding 06/27/2021  ? Anemia 01/29/2016  ? Atrial fibrillation ([place])   ? post op.  s/p dc-cv 04/2008. previously on amiodarone  ? Atrial flutter ([place])   ? atypical  ? AV block, 1st degree   ? .450 msec--progressive  ? 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  a- Echo 07/2008 Ef 55% mild peroprosthetic MVR with High transmitral gradient (mean 14).  b- normal coronaries by cath 12/2007  ? BPH (benign prostatic hyperplasia)   ? Cardiac resynchronization therapy pacemaker (CRT-P) in place 06/22/2011  ? CHF (congestive heart failure) ([place])   ? EF 35-40 % 2011 due to valvular disease and diastolic dysfunction  ? Chronic back pain   ? CKD (chronic kidney disease), stage III ([place])   ? COVID-19 virus infection 07/24/2020  ?  Tested positive on July 16, 2020  ? Dysphagia   ? with normal barium swallow 09/2008  ? Endocarditis   ? GERD (gastroesophageal reflux disease)   ? Heavy alcohol use   ? history  ? History of atrial flutter 03/13/2008  ? History of atrial flutter - resolved.   ? History of GI bleed   ? Hypertension   ? IV drug abuse ([place])   ? history of  ? Lower GI bleed 01/2016  ? Memory impairment 12/24/2015  ? Pacemaker 2010  ? ? ?Assessment: ? 76 y.o male taking Warfarin PTA for h/o mechanical mitral valve replacement and AFib . Goal INR 2.5-3.5 per  anticoagulation clinic note on 10/20/21. Noted h/o acute GI bleed (06/27/21). Pharmacy consulted to dose warfarin and enoxaparin bridge.  ? ?INR  1.9 today  (Goal INR 2.5-3.5). Started enoxaparin bridge given subtherapeutic INR 4/22.Scr worsening from 1.87 to 2.08 today. CrCl <  30 now so will decrease lovenox frequency to q24h. CBC stable, no s/x of bleeding per RN. ? ?Warfarin PTA dose: pts friend [name] who gives pt his meds  reported PTA warfarin dose:  1/2 tab (2.5 mg) daily except NONE qTues with last taken on 10/24/19.  Note that this is different dosage from [facility] visit note 10/20/21 when INR = 2.0 and  pharmacist instructed patient to take  1/2 tab (2.5 mg) daily except  take 1 tab (5 mg) qMon. Goal INR 2.5-3.5 ? ?H&P indicates that he has been admitted to hospital 3 times in the last 4 months for GI bleed, heart failure exacerbation, and TIA and that Mr. Tora Perches appears to have cognitive impairment currently. ? ?Goal of Therapy:  ?INR 2.5-3.5 ? ?Plan:  ?Give Warfarin 5 mg today x1  (Goal INR =2.5-3.5) ?Change enoxaparin 80 mg q12h to 80 mg q24h with worsening SCr ?Daily INR and CBC ?Monitor for s/sx of bleeding  (has h/o GIB 06/27/21) ?Monitor renal function ? ?Cathrine Muster, PharmD ?PGY2 Cardiology Pharmacy Resident ?Phone: [phone] ?10/26/2021  10:02 AM ? ?Please check [email or web address] for unit-specific pharmacy phone numbers. ? ? ?

## 2021-10-28 ENCOUNTER — Telehealth: Payer: Self-pay | Admitting: Internal Medicine

## 2021-10-28 ENCOUNTER — Ambulatory Visit (INDEPENDENT_AMBULATORY_CARE_PROVIDER_SITE_OTHER): Payer: 59 | Admitting: Pharmacist

## 2021-10-28 ENCOUNTER — Encounter: Payer: 59 | Admitting: Pharmacist

## 2021-10-28 DIAGNOSIS — Z8679 Personal history of other diseases of the circulatory system: Secondary | ICD-10-CM

## 2021-10-28 DIAGNOSIS — Z952 Presence of prosthetic heart valve: Secondary | ICD-10-CM

## 2021-10-28 LAB — POCT INR: INR: 2.1 (ref 2.0–3.0)

## 2021-10-28 NOTE — Progress Notes (Signed)
Anticoagulation Management ?[name] is a 76 y.o. male who reports to the clinic for monitoring of warfarin treatment.   ? ?Indication:  History of mitral valve replacement; history of atrial flutter; long term current use of oral anticoagulant, warfarin--target INR 2.5 - 3.5.   ?Duration: indefinite ?Supervising physician:  Velna Ochs, MD ? ?Anticoagulation Clinic Visit History: ?Patient does not report signs/symptoms of bleeding or thromboembolism  ?Other recent changes: No diet, medications, lifestyle changes except as noted in patient findings.  ?Anticoagulation Episode Summary   ? ? Current INR goal:  2.5-3.5  ?TTR:  60.2 % (9.1 y)  ?Next INR check:  11/03/2021  ?INR from last check:  2.1 (10/28/2021)  ?Weekly max warfarin dose:    ?Target end date:  Indefinite  ?INR check location:  Anticoagulation Clinic  ?Preferred lab:    ?Send INR reminders to:  [facility]  ? Indications   ?History of atrial flutter [Z86.79] ?Long term (current) use of anticoagulants (Resolved) [Z79.01] ?ATRIAL FLUTTER (Resolved) [I48.92] ?H/O mitral valve replacement with mechanical valve [Z95.2] ? ?  ?  ? ? Comments:    ?  ? ?  ? ?Anticoagulation Care Providers   ? ? Provider Role Specialty Phone number  ? Bensimhon, Shaune Pascal, MD  Cardiology [phone]  ? ?  ? ? ?No Known Allergies ? ?Current Outpatient Medications:  ?  acetaminophen (TYLENOL) 500 MG tablet, Take 1,000 mg by mouth every 6 (six) hours as needed (pain)., Disp: , Rfl:  ?  atorvastatin (LIPITOR) 20 MG tablet, Take 1 tablet (20 mg total) by mouth every evening., Disp: 30 tablet, Rfl: 2 ?  carvedilol (COREG) 6.25 MG tablet, Take 1 tablet (6.25 mg total) by mouth 2 (two) times daily with a meal., Disp: 180 tablet, Rfl: 3 ?  diclofenac Sodium (VOLTAREN) 1 % GEL, APPLY 4 G TOPICALLY 4 TIMES DAILY, Disp: 400 g, Rfl: 11 ?  pantoprazole (PROTONIX) 40 MG tablet, TAKE 1 TABLET (40 MG TOTAL) BY MOUTH TWICE A DAY BEFORE MEALS, Disp: 180 tablet, Rfl: 1 ?  tamsulosin  (FLOMAX) 0.4 MG CAPS capsule, Take 2 capsules (0.8 mg total) by mouth daily after breakfast., Disp: 180 capsule, Rfl: 3 ?  torsemide (DEMADEX) 20 MG tablet, TAKE 1 TABLET BY MOUTH TWICE A DAY (Patient taking differently: Reported MD recently increased dose to 2 tablets (40 mg) twice daily.), Disp: 180 tablet, Rfl: 0 ?  warfarin (COUMADIN) 5 MG tablet, Take warfarin 5 mg tab evening of 4/23 and follow-up in [facility] on Monday with Dr. Elie Confer., Disp: 30 tablet, Rfl: 11 ?Past Medical History:  ?Diagnosis Date  ? Acute GI bleeding 06/27/2021  ? Anemia 01/29/2016  ? Atrial fibrillation ([place])   ? post op.  s/p dc-cv 04/2008. previously on amiodarone  ? Atrial flutter ([place])   ? atypical  ? AV block, 1st degree   ? .450 msec--progressive  ? 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  a- Echo 07/2008 Ef 55% mild peroprosthetic MVR with High transmitral gradient (mean 14).  b- normal coronaries by cath 12/2007  ? BPH (benign prostatic hyperplasia)   ? Cardiac resynchronization therapy pacemaker (CRT-P) in place 06/22/2011  ? CHF (congestive heart failure) ([place])   ? EF 35-40 % 2011 due to valvular disease and diastolic dysfunction  ? Chronic back pain   ? CKD (chronic kidney disease), stage III ([place])   ? COVID-19 virus infection 07/24/2020  ? Tested positive on  July 16, 2020  ? Dysphagia   ? with normal barium swallow 09/2008  ? Endocarditis   ? GERD (gastroesophageal reflux disease)   ? Heavy alcohol use   ? history  ? History of atrial flutter 03/13/2008  ? History of atrial flutter - resolved.   ? History of GI bleed   ? Hypertension   ? IV drug abuse ([place])   ? history of  ? Lower GI bleed 01/2016  ? Memory impairment 12/24/2015  ? Pacemaker 2010  ? ?Social History  ? ?Socioeconomic History  ? Marital status: Divorced  ?  Spouse name: Not on file  ? Number of children: 9  ? Years of education: Not on file  ? Highest education level: Not on file  ?Occupational History   ? Occupation: retired  ?  Comment: [employer]  ?Tobacco Use  ? Smoking status: Former  ?  Years: 0.00  ?  Types: Cigarettes  ? Smokeless tobacco: Never  ?Vaping Use  ? Vaping Use: Never used  ?Substance and Sexual Activity  ? Alcohol use: No  ?  Alcohol/week: 0.0 standard drinks  ? Drug use: No  ? Sexual activity: Not Currently  ?Other Topics Concern  ? Not on file  ?Social History Narrative  ? Current Social History 02/16/2019    ?   ? Patient lives with family (baby's 36, daughter and 4 grandkids:  47 yo twins, 19 yo and 60 yo in a home which is 1 story. There are 4 steps with handrails up to the entrance the patient uses.   ?   ? Patient's method of transportation is via family member (baby's mama).  ?   ? The highest level of education was high school diploma.  ?   ? The patient currently retired.  ?   ? Identified important Relationships are "My 81 Joselyn Arrow, [name]."   ?   ? Pets : None  ?    ? Interests / Fun: Sitting outdoors watching the birds and planes."   ?   ? Exercise riding a bike 5 miles 3 days/week  ?   ? Current Stressors: The grandkids   ?   ? Religious / Personal Beliefs: "I believe in God."   ?   ? L. Ducatte, RN, BSN   ?   ? ?Social Determinants of Health  ? ?Financial Resource Strain: Low Risk   ? Difficulty of Paying Living Expenses: Not very hard  ?Food Insecurity: No Food Insecurity  ? Worried About Charity fundraiser in the Last Year: Never true  ? Ran Out of Food in the Last Year: Never true  ?Transportation Needs: No Transportation Needs  ? Lack of Transportation (Medical): No  ? Lack of Transportation (Non-Medical): No  ?Physical Activity: Sufficiently Active  ? Days of Exercise per Week: 3 days  ? Minutes of Exercise per Session: 50 min  ?Stress: Not on file  ?Social Connections: Unknown  ? Frequency of Communication with Friends and Family: More than three times a week  ? Frequency of Social Gatherings with Friends and Family: More than three times a week  ? Attends Religious  Services: Not on file  ? Active Member of Clubs or Organizations: No  ? Attends Archivist Meetings: Never  ? Marital Status: Not on file  ? ?Family History  ?Problem Relation Age of Onset  ? Coronary artery disease Mother   ? Cancer Father   ?     GI  ?  Cancer Brother   ?     Lung  ? ? ?ASSESSMENT ?Recent Results: ?The most recent result is correlated with 22.5 mg per week: ?Lab Results  ?Component Value Date  ? INR 2.1 10/28/2021  ? INR 1.9 (H) 10/26/2021  ? INR 1.5 (H) 10/25/2021  ? ? ?Anticoagulation Dosing: ?Description   ?Take one (1) tablet on Sundays, Mondays, Tuesdays and Fridays. Take only one-half (1/2) tablet on Wednesdays, Thursdays and Saturdays.  ?  ?  ?INR today: Subtherapeutic ? ?PLAN ?Weekly dose was increased by 19% to 27.5 mg per week ? ?Patient Instructions  ?Patient instructed to take medications as defined in the Anti-coagulation Track section of this encounter.  ?Patient instructed to take today's dose.  ?Patient instructed to take one (1) tablet on Sundays, Mondays, Tuesdays and Fridays. Take only one-half (1/2) tablet on Wednesdays, Thursdays and Saturdays.  ?Patient verbalized understanding of these instructions.   ?Patient advised to contact clinic or seek medical attention if signs/symptoms of bleeding or thromboembolism occur. ? ?Patient verbalized understanding by repeating back information and was advised to contact me if further medication-related questions arise. Patient was also provided an information handout. ? ?Follow-up ?Return in 6 days (on 11/03/2021). ? ?Pennie Banter, PharmD, CPP ? ?15 minutes spent face-to-face with the patient during the encounter. 50% of time spent on education, including signs/sx bleeding and clotting, as well as food and drug interactions with warfarin. 50% of time was spent on fingerprick POC INR sample collection,processing, results determination, and documentation in http://www.kim.net/.  ?

## 2021-10-28 NOTE — Telephone Encounter (Signed)
-----   Message from Rudene Christians, [place] sent at 10/26/2021 12:31 PM EDT ----- ?Regarding: hospital follow-up ?Hello, ? ?Please have [name] follow-up in clinic within the week for hospital follow-up. I also asked them to follow-up with Dr. [name] in the INR clinic 4/24 and sent Dr. [name] a message in regards to this. ? ?Thank you, ? ?Katie M. Masters, D.O.  ?Internal Medicine Resident, PGY-1 ?[facility]  ? ? ?  ? ?

## 2021-10-28 NOTE — Patient Instructions (Signed)
Patient instructed to take medications as defined in the Anti-coagulation Track section of this encounter.  ?Patient instructed to take today's dose.  ?Patient instructed to take one (1) tablet on Sundays, Mondays, Tuesdays and Fridays. Take only one-half (1/2) tablet on Wednesdays, Thursdays and Saturdays.  ?Patient verbalized understanding of these instructions.   ?

## 2021-10-28 NOTE — Telephone Encounter (Signed)
TOC HFU appointment scheduled for 11/03/2021 at 10:15 am per patient's transportation. ?

## 2021-10-29 NOTE — Progress Notes (Signed)
INTERNAL MEDICINE TEACHING ATTENDING ADDENDUM   I agree with pharmacy recommendations as outlined in their note.   Nivek Powley, MD  

## 2021-10-31 ENCOUNTER — Telehealth: Payer: 59

## 2021-10-31 ENCOUNTER — Telehealth: Payer: Self-pay | Admitting: *Deleted

## 2021-10-31 ENCOUNTER — Telehealth: Payer: Self-pay

## 2021-10-31 NOTE — Telephone Encounter (Signed)
Transition Care Management Follow-up Telephone Call ?Date of discharge and from where: [facility] 10/26/21 ?How have you been since you were released from the hospital? Patient notes he is feeling well since hospital discharge. His friend, Mrs. Beverely Pace agrees he is better. ?Any questions or concerns? No ? ?Items Reviewed: ?Did the pt receive and understand the discharge instructions provided? Yes  ?Medications obtained and verified? Yes  ?Other? No  ?Any new allergies since your discharge? No  ?Dietary orders reviewed? No ?Do you have support at home? Yes  ? ?Home Care and Equipment/Supplies: ?Were home health services ordered? no ?If so, what is the name of the agency? N/A  ?Has the agency set up a time to come to the patient's home? no ?Were any new equipment or medical supplies ordered?  No ?What is the name of the medical supply agency? N/A ?Were you able to get the supplies/equipment? no ?Do you have any questions related to the use of the equipment or supplies? No ? ?Functional Questionnaire: (I = Independent and D = Dependent) ?ADLs: D ? ?Bathing/Dressing- D ? ?Meal Prep- D ? ?Eating- I ? ?Maintaining continence- I ? ?Transferring/Ambulation- I ? ?Managing Meds- D ? ?Follow up appointments reviewed: ? ?PCP Hospital f/u appt confirmed? Yes  Scheduled to see Dr. Sande Brothers on 11/03/21 @ 10:15. ?Specialist Hospital f/u appt confirmed?  N/A   ?Are transportation arrangements needed? No  ?If their condition worsens, is the pt aware to call PCP or go to the Emergency Dept.? Yes ?Was the patient provided with contact information for the PCP's office or ED? Yes ?Was to pt encouraged to call back with questions or concerns? Yes ?Jodelle Gross, RN, BSN, CCM ?Care Management Coordinator ?[facility] ?Phone: [phone]/Fax: [phone]   ?

## 2021-10-31 NOTE — Chronic Care Management (AMB) (Signed)
?  Care Management  ? ?Note ? ?10/31/2021 ?Name: ORLANDUS ROBLEDO MRN: [id number] DOB: 1945-10-03 ? ?ELIAJAH BRENAN is a 76 y.o. year old male who is a primary care patient of [name], MD and is actively engaged with the care management team. I reached out to Leeanne Deed by phone today to assist with scheduling an initial visit with the BSW ? ?Follow up plan: ?Telephone appointment with care management team member scheduled for:11/17/21 ? ?Laverda Sorenson  ?Care Guide, Embedded Care Coordination ?[place]  Care Management  ?Direct Dial: [phone] ? ?

## 2021-10-31 NOTE — Chronic Care Management (AMB) (Signed)
?  Care Management  ? ?Note ? ?10/31/2021 ?Name: [name] MRN: [id number] DOB: Mar 28, 1946 ? ?[name] is a 76 y.o. year old male who is a primary care patient of Dellia Cloud, MD and is actively engaged with the care management team. I reached out to [name] by phone today to assist with scheduling an initial visit with the BSW ? ?Follow up plan: ?Unsuccessful telephone outreach attempt made. A HIPAA compliant phone message was left for the patient providing contact information and requesting a return call.  ?The care management team will reach out to the patient again over the next 7 days.  ?If patient returns call to provider office, please advise to call Embedded Care Management Care Guide Misty Stanley  at [phone] ? ?Gwenevere Ghazi  ?Care Guide, Embedded Care Coordination ?[place]  Care Management  ?Direct Dial: [phone] ? ?

## 2021-11-03 ENCOUNTER — Ambulatory Visit (INDEPENDENT_AMBULATORY_CARE_PROVIDER_SITE_OTHER): Payer: 59 | Admitting: Internal Medicine

## 2021-11-03 ENCOUNTER — Encounter: Payer: Self-pay | Admitting: Internal Medicine

## 2021-11-03 ENCOUNTER — Other Ambulatory Visit: Payer: Self-pay

## 2021-11-03 ENCOUNTER — Ambulatory Visit: Payer: 59 | Admitting: Pharmacist

## 2021-11-03 VITALS — BP 115/61 | HR 84 | Temp 98.3°F | Ht 68.0 in | Wt 190.0 lb

## 2021-11-03 DIAGNOSIS — Z8679 Personal history of other diseases of the circulatory system: Secondary | ICD-10-CM

## 2021-11-03 DIAGNOSIS — Z952 Presence of prosthetic heart valve: Secondary | ICD-10-CM | POA: Diagnosis not present

## 2021-11-03 DIAGNOSIS — G479 Sleep disorder, unspecified: Secondary | ICD-10-CM | POA: Diagnosis not present

## 2021-11-03 DIAGNOSIS — I5023 Acute on chronic systolic (congestive) heart failure: Secondary | ICD-10-CM | POA: Diagnosis not present

## 2021-11-03 DIAGNOSIS — I5022 Chronic systolic (congestive) heart failure: Secondary | ICD-10-CM

## 2021-11-03 LAB — POCT INR: INR: 2.4 (ref 2.0–3.0)

## 2021-11-03 NOTE — Progress Notes (Signed)
Anticoagulation Management ?[name] is a 76 y.o. male who reports to the clinic for monitoring of warfarin treatment.   ? ?Indication:  Mitral valve replacement, atrial fibrillation, Long term current use of oral anticoagulant warfarin with target INR 2.5 - 3.5. ? ?Duration: indefinite ?Supervising physician:  Dorian Pod, MD ? ?Anticoagulation Clinic Visit History: ?Patient does not report signs/symptoms of bleeding or thromboembolism  ?Other recent changes: no diet, medications, lifestyle changes cited.  ?Anticoagulation Episode Summary   ? ? Current INR goal:  2.5-3.5  ?TTR:  60.1 % (9.1 y)  ?Next INR check:  11/24/2021  ?INR from last check:  2.4 (11/03/2021)  ?Weekly max warfarin dose:    ?Target end date:  Indefinite  ?INR check location:  Anticoagulation Clinic  ?Preferred lab:    ?Send INR reminders to:  ANTICOAG IMP  ? Indications   ?History of atrial flutter [Z86.79] ?Long term (current) use of anticoagulants (Resolved) [Z79.01] ?ATRIAL FLUTTER (Resolved) [I48.92] ?H/O mitral valve replacement with mechanical valve [Z95.2] ? ?  ?  ? ? Comments:    ?  ? ?  ? ?Anticoagulation Care Providers   ? ? Provider Role Specialty Phone number  ? Bensimhon, Shaune Pascal, MD  Cardiology [phone]  ? ?  ? ? ?No Known Allergies ? ?Current Outpatient Medications:  ?  acetaminophen (TYLENOL) 500 MG tablet, Take 1,000 mg by mouth every 6 (six) hours as needed (pain)., Disp: , Rfl:  ?  atorvastatin (LIPITOR) 20 MG tablet, Take 1 tablet (20 mg total) by mouth every evening., Disp: 30 tablet, Rfl: 2 ?  carvedilol (COREG) 6.25 MG tablet, Take 1 tablet (6.25 mg total) by mouth 2 (two) times daily with a meal., Disp: 180 tablet, Rfl: 3 ?  diclofenac Sodium (VOLTAREN) 1 % GEL, APPLY 4 G TOPICALLY 4 TIMES DAILY, Disp: 400 g, Rfl: 11 ?  pantoprazole (PROTONIX) 40 MG tablet, TAKE 1 TABLET (40 MG TOTAL) BY MOUTH TWICE A DAY BEFORE MEALS, Disp: 180 tablet, Rfl: 1 ?  tamsulosin (FLOMAX) 0.4 MG CAPS capsule, Take 2 capsules (0.8 mg  total) by mouth daily after breakfast., Disp: 180 capsule, Rfl: 3 ?  torsemide (DEMADEX) 20 MG tablet, TAKE 1 TABLET BY MOUTH TWICE A DAY (Patient taking differently: Reported MD recently increased dose to 2 tablets (40 mg) twice daily.), Disp: 180 tablet, Rfl: 0 ?  warfarin (COUMADIN) 5 MG tablet, Take warfarin 5 mg tab evening of 4/23 and follow-up in [facility] on Monday with Dr. Elie Confer., Disp: 30 tablet, Rfl: 11 ?Past Medical History:  ?Diagnosis Date  ? Acute GI bleeding 06/27/2021  ? Anemia 01/29/2016  ? Atrial fibrillation ([place])   ? post op.  s/p dc-cv 04/2008. previously on amiodarone  ? Atrial flutter ([place])   ? atypical  ? AV block, 1st degree   ? .450 msec--progressive  ? 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  a- Echo 07/2008 Ef 55% mild peroprosthetic MVR with High transmitral gradient (mean 14).  b- normal coronaries by cath 12/2007  ? BPH (benign prostatic hyperplasia)   ? Cardiac resynchronization therapy pacemaker (CRT-P) in place 06/22/2011  ? CHF (congestive heart failure) ([place])   ? EF 35-40 % 2011 due to valvular disease and diastolic dysfunction  ? Chronic back pain   ? CKD (chronic kidney disease), stage III ([place])   ? COVID-19 virus infection 07/24/2020  ? Tested positive on July 16, 2020  ? Dysphagia   ?  with normal barium swallow 09/2008  ? Endocarditis   ? GERD (gastroesophageal reflux disease)   ? Heavy alcohol use   ? history  ? History of atrial flutter 03/13/2008  ? History of atrial flutter - resolved.   ? History of GI bleed   ? Hypertension   ? IV drug abuse ([place])   ? history of  ? Lower GI bleed 01/2016  ? Memory impairment 12/24/2015  ? Pacemaker 2010  ? ?Social History  ? ?Socioeconomic History  ? Marital status: Divorced  ?  Spouse name: Not on file  ? Number of children: 9  ? Years of education: Not on file  ? Highest education level: Not on file  ?Occupational History  ? Occupation: retired  ?  Comment: [employer]   ?Tobacco Use  ? Smoking status: Former  ?  Years: 0.00  ?  Types: Cigarettes  ? Smokeless tobacco: Never  ?Vaping Use  ? Vaping Use: Never used  ?Substance and Sexual Activity  ? Alcohol use: No  ?  Alcohol/week: 0.0 standard drinks  ? Drug use: No  ? Sexual activity: Not Currently  ?Other Topics Concern  ? Not on file  ?Social History Narrative  ? Current Social History 02/16/2019    ?   ? Patient lives with family (baby's 39, daughter and 4 grandkids:  27 yo twins, 32 yo and 6 yo in a home which is 1 story. There are 4 steps with handrails up to the entrance the patient uses.   ?   ? Patient's method of transportation is via family member (baby's mama).  ?   ? The highest level of education was high school diploma.  ?   ? The patient currently retired.  ?   ? Identified important Relationships are "My 35 [address], [name]."   ?   ? Pets : None  ?    ? Interests / Fun: Sitting outdoors watching the birds and planes."   ?   ? Exercise riding a bike 5 miles 3 days/week  ?   ? Current Stressors: The grandkids   ?   ? Religious / Personal Beliefs: "I believe in God."   ?   ? L. Ducatte, RN, BSN   ?   ? ?Social Determinants of Health  ? ?Financial Resource Strain: Low Risk   ? Difficulty of Paying Living Expenses: Not very hard  ?Food Insecurity: No Food Insecurity  ? Worried About Charity fundraiser in the Last Year: Never true  ? Ran Out of Food in the Last Year: Never true  ?Transportation Needs: No Transportation Needs  ? Lack of Transportation (Medical): No  ? Lack of Transportation (Non-Medical): No  ?Physical Activity: Sufficiently Active  ? Days of Exercise per Week: 3 days  ? Minutes of Exercise per Session: 50 min  ?Stress: Not on file  ?Social Connections: Unknown  ? Frequency of Communication with Friends and Family: More than three times a week  ? Frequency of Social Gatherings with Friends and Family: More than three times a week  ? Attends Religious Services: Not on file  ? Active Member of Clubs or  Organizations: No  ? Attends Archivist Meetings: Never  ? Marital Status: Not on file  ? ?Family History  ?Problem Relation Age of Onset  ? Coronary artery disease Mother   ? Cancer Father   ?     GI  ? Cancer Brother   ?  Lung  ? ? ?ASSESSMENT ?Recent Results: ?The most recent result is correlated with 27.5 mg per week: ?Lab Results  ?Component Value Date  ? INR 2.4 11/03/2021  ? INR 2.1 10/28/2021  ? INR 1.9 (H) 10/26/2021  ? ? ?Anticoagulation Dosing: ?Description   ?Take one (1) tablet Mondays, Tuesdays, Wednesdays, Thursdays and Fridays. On Saturdays and Sundays, take ONLY 1/2 tablet.  ?  ?  ?INR today: Subtherapeutic ? ?PLAN ?Weekly dose was increased by 9% to 30 mg per week ? ?Patient Instructions  ?Patient instructed to take medications as defined in the Anti-coagulation Track section of this encounter.  ?Patient instructed to take today's dose.  ?Patient instructed to take one (1) tablet Mondays, Tuesdays, Wednesdays, Thursdays and Fridays. On Saturdays and Sundays, take ONLY 1/2 tablet.  ?Patient verbalized understanding of these instructions.   ?Patient advised to contact clinic or seek medical attention if signs/symptoms of bleeding or thromboembolism occur. ? ?Patient verbalized understanding by repeating back information and was advised to contact me if further medication-related questions arise. Patient was also provided an information handout. ? ?Follow-up ?Return in 3 weeks (on 11/24/2021) for Follow up INR. ? ?Pennie Banter, PharmD, CPP ? ?15 minutes spent face-to-face with the patient during the encounter. 50% of time spent on education, including signs/sx bleeding and clotting, as well as food and drug interactions with warfarin. 50% of time was spent on fingerprick POC INR sample collection,processing, results determination, and documentation in http://www.kim.net/.  ?

## 2021-11-03 NOTE — Patient Instructions (Signed)
Patient instructed to take medications as defined in the Anti-coagulation Track section of this encounter.  Patient instructed to take today's dose.  Patient instructed to take one (1) tablet Mondays, Tuesdays, Wednesdays, Thursdays and Fridays. On Saturdays and Sundays, take ONLY 1/2 tablet.  Patient verbalized understanding of these instructions.   

## 2021-11-03 NOTE — Patient Instructions (Signed)
[name],  ? ?It was a pleasure meeting you today! Today we discussed your recent discharge from the hospital. According to our clinic scale, you have gained about 10 pounds. We are going to collect labs to see your kidney function today, and will increase your Torsemide to 40 mg (2 tablets) two times a day, and will have you come back for an appointment next week to see how you are doing.  ? ?For your sleep disturbances, I am concerned that you may have underlying sleep apnea, which can make your heart failure difficult to treat. I am going to refer you for a sleep study to have this further assessed. Additionally, I will call your daughter today to discuss your home medications and weights at home.  ? ?Have a good day,  ?Dolan Amen, MD ?

## 2021-11-04 LAB — BMP8+ANION GAP
Anion Gap: 19 mmol/L — ABNORMAL HIGH (ref 10.0–18.0)
BUN/Creatinine Ratio: 17 (ref 10–24)
BUN: 29 mg/dL — ABNORMAL HIGH (ref 8–27)
CO2: 23 mmol/L (ref 20–29)
Calcium: 9.4 mg/dL (ref 8.6–10.2)
Chloride: 100 mmol/L (ref 96–106)
Creatinine, Ser: 1.72 mg/dL — ABNORMAL HIGH (ref 0.76–1.27)
Glucose: 96 mg/dL (ref 70–99)
Potassium: 3.9 mmol/L (ref 3.5–5.2)
Sodium: 142 mmol/L (ref 134–144)
eGFR: 40 mL/min/{1.73_m2} — ABNORMAL LOW (ref >59)

## 2021-11-04 LAB — CBC
Hematocrit: 32.6 % — ABNORMAL LOW (ref 37.5–51.0)
Hemoglobin: 9.2 g/dL — ABNORMAL LOW (ref 13.0–17.7)
MCH: 20.5 pg — ABNORMAL LOW (ref 26.6–33.0)
MCHC: 28.2 g/dL — ABNORMAL LOW (ref 31.5–35.7)
MCV: 73 fL — ABNORMAL LOW (ref 79–97)
Platelets: 476 10*3/uL — ABNORMAL HIGH (ref 150–450)
RBC: 4.49 x10E6/uL (ref 4.14–5.80)
RDW: 18.2 % — ABNORMAL HIGH (ref 11.6–15.4)
WBC: 7.2 10*3/uL (ref 3.4–10.8)

## 2021-11-04 NOTE — Progress Notes (Signed)
? ?CC: Hospital follow up ? ?HPI: ? ?[name] is a 76 y.o. person, with a PMH noted below, who presents to the clinic Hospital follow up. To see the management of their acute and chronic conditions, please see the A&P note under the Encounters tab.  ? ?Past Medical History:  ?Diagnosis Date  ? Acute GI bleeding 06/27/2021  ? Anemia 01/29/2016  ? Atrial fibrillation ([place])   ? post op.  s/p dc-cv 04/2008. previously on amiodarone  ? Atrial flutter ([place])   ? atypical  ? AV block, 1st degree   ? .450 msec--progressive  ? 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  a- Echo 07/2008 Ef 55% mild peroprosthetic MVR with High transmitral gradient (mean 14).  b- normal coronaries by cath 12/2007  ? BPH (benign prostatic hyperplasia)   ? Cardiac resynchronization therapy pacemaker (CRT-P) in place 06/22/2011  ? CHF (congestive heart failure) ([place])   ? EF 35-40 % 2011 due to valvular disease and diastolic dysfunction  ? Chronic back pain   ? CKD (chronic kidney disease), stage III ([place])   ? COVID-19 virus infection 07/24/2020  ? Tested positive on July 16, 2020  ? Dysphagia   ? with normal barium swallow 09/2008  ? Endocarditis   ? GERD (gastroesophageal reflux disease)   ? Heavy alcohol use   ? history  ? History of atrial flutter 03/13/2008  ? History of atrial flutter - resolved.   ? History of GI bleed   ? Hypertension   ? IV drug abuse ([place])   ? history of  ? Lower GI bleed 01/2016  ? Memory impairment 12/24/2015  ? Pacemaker 2010  ? ?Review of Systems:   ?Review of Systems  ?Constitutional:  Negative for chills, diaphoresis, malaise/fatigue and weight loss.  ?Respiratory:  Negative for cough.   ?Cardiovascular:  Positive for leg swelling. Negative for chest pain, palpitations and orthopnea.  ?Gastrointestinal:  Negative for abdominal pain, constipation, diarrhea, nausea and vomiting.  ?Neurological:  Negative for dizziness.   ? ?Physical Exam: ?Vitals:  ? 11/03/21 0943  ?BP: 115/61   ?Pulse: 84  ?Temp: 98.3 ?F (36.8 ?C)  ?TempSrc: Oral  ?SpO2: 98%  ?Weight: 190 lb (86.2 kg)  ?Height: 5\' 8"  (1.727 m)  ? ?Physical Exam ?Constitutional:   ?   General: He is not in acute distress. ?   Appearance: Normal appearance. He is not ill-appearing, toxic-appearing or diaphoretic.  ?HENT:  ?   Head: Normocephalic.  ?Cardiovascular:  ?   Rate and Rhythm: Normal rate and regular rhythm.  ?   Pulses: Normal pulses.  ?   Heart sounds: Normal heart sounds. No murmur heard. ?  No friction rub. No gallop.  ?Pulmonary:  ?   Effort: Pulmonary effort is normal.  ?   Breath sounds: Normal breath sounds. No wheezing, rhonchi or rales.  ?Abdominal:  ?   General: Abdomen is flat. Bowel sounds are normal.  ?   Palpations: Abdomen is soft.  ?   Tenderness: There is no abdominal tenderness.  ?Musculoskeletal:     ?   General: No tenderness.  ?   Right lower leg: Edema present.  ?   Left lower leg: Edema present.  ?   Comments: 2+ pitting edema up to the knees bilaterally  ?Neurological:  ?   Mental Status: He is alert.  ?Psychiatric:     ?   Mood and Affect: Mood normal.     ?  Behavior: Behavior normal.  ?  ? ?Assessment & Plan:  ? ?See Encounters Tab for problem based charting. ? ?Patient discussed with Dr. Philipp Ovens ? ?

## 2021-11-05 DIAGNOSIS — G479 Sleep disorder, unspecified: Secondary | ICD-10-CM | POA: Insufficient documentation

## 2021-11-05 NOTE — Assessment & Plan Note (Signed)
Patient presents for hospital follow-up for acute on chronic HFrEF.  He states that he has been doing well since his hospital stay, but he has concerns for tightness in his legs. He states that he is compliant with his medications. He denies any fevers, headaches, dietary nonadherence, dyspnea, orthopnea, chest pain, palpitations, abdominal pain, constipation, or diarrhea. ? ?A/P: ?Patient presents for hospital follow-up for biventricular HFrEF exacerbation, and found to be 12 pounds up from discharge.  He has 2+ pitting edema up to the knees bilaterally he states that he is compliant with his medications, but there is some cognitive decline that may be contributing to possible medication nonadherence.  Attempt to call his friend, but was unable to reach him and left a HIPAA compliant voicemail.  Discussed today doubling his torsemide dose for the following 5 days and presenting back on 11/10/2021. Patient is amenable to a changes.  Given that we will be increasing his torsemide, will hold off on low-dose spironolactone for now.  We will further discuss starting SGLT2 inhibitor at his follow-up visit. ?- Continue Coreg 6.25 mg daily ?- Increase torsemide to 40 mg twice daily for 5 days ?- Follow-up in 1 week for evaluation ?

## 2021-11-05 NOTE — Assessment & Plan Note (Signed)
Patient with history of mitral valve replacement with a mechanical valve presents for hospital follow-up appointment.  Last INR was 2.1 on 10/28/2021, with a target goal of 2.5-3.5.  Has had appointment with Dr. [name] today. ?- Obtain POC INR ?- Follow-up with Dr. [name] ?

## 2021-11-05 NOTE — Assessment & Plan Note (Signed)
Patient states that for "a long time" he has been having sleep disturbances, where he wakes up suddenly in the middle of the night and has difficulty falling back asleep. He states that this makes him tired throughout the day.  He is unsure if he snores, sleep talks, or stops breathing while he sleeps, as he sleeps alone in his house.  He has not tried anything to help him with his sleep. ? ?A/P: ?Patient with sudden waking in the middle of the night presenting with continued daily fatigue.  He states that this occurs nightly, not just during heart failure exacerbations.  Given his history of hypertension, and heart failure, his STOP-BANG score is also intermediate which I think further warrants a sleep study to assess for OSA.  Given his comorbidities, sleep study in the inpatient study is preferred. ?- Dilatory referral for sleep study. ?

## 2021-11-06 NOTE — Progress Notes (Signed)
INTERNAL MEDICINE TEACHING ATTENDING ADDENDUM   I agree with pharmacy recommendations as outlined in their note.   Alysiana Ethridge, MD  

## 2021-11-10 ENCOUNTER — Emergency Department (HOSPITAL_COMMUNITY): Payer: 59

## 2021-11-10 ENCOUNTER — Other Ambulatory Visit: Payer: Self-pay

## 2021-11-10 ENCOUNTER — Emergency Department (HOSPITAL_BASED_OUTPATIENT_CLINIC_OR_DEPARTMENT_OTHER): Payer: 59

## 2021-11-10 ENCOUNTER — Ambulatory Visit (INDEPENDENT_AMBULATORY_CARE_PROVIDER_SITE_OTHER): Payer: 59 | Admitting: Internal Medicine

## 2021-11-10 ENCOUNTER — Emergency Department (HOSPITAL_COMMUNITY)
Admission: EM | Admit: 2021-11-10 | Discharge: 2021-11-10 | Disposition: A | Discharge status: Home / Self Care | Payer: 59 | Attending: Emergency Medicine | Admitting: Emergency Medicine

## 2021-11-10 ENCOUNTER — Encounter (HOSPITAL_COMMUNITY): Payer: Self-pay | Admitting: Emergency Medicine

## 2021-11-10 DIAGNOSIS — Z7902 Long term (current) use of antithrombotics/antiplatelets: Secondary | ICD-10-CM | POA: Diagnosis not present

## 2021-11-10 DIAGNOSIS — I5022 Chronic systolic (congestive) heart failure: Secondary | ICD-10-CM | POA: Diagnosis not present

## 2021-11-10 DIAGNOSIS — I110 Hypertensive heart disease with heart failure: Secondary | ICD-10-CM | POA: Diagnosis not present

## 2021-11-10 DIAGNOSIS — R609 Edema, unspecified: Secondary | ICD-10-CM

## 2021-11-10 DIAGNOSIS — M25561 Pain in right knee: Secondary | ICD-10-CM | POA: Insufficient documentation

## 2021-11-10 DIAGNOSIS — M79604 Pain in right leg: Secondary | ICD-10-CM | POA: Diagnosis not present

## 2021-11-10 LAB — CBC
HCT: 33.6 % — ABNORMAL LOW (ref 39.0–52.0)
Hemoglobin: 9 g/dL — ABNORMAL LOW (ref 13.0–17.0)
MCH: 19.9 pg — ABNORMAL LOW (ref 26.0–34.0)
MCHC: 26.8 g/dL — ABNORMAL LOW (ref 30.0–36.0)
MCV: 74.2 fL — ABNORMAL LOW (ref 80.0–100.0)
Platelets: 412 10*3/uL — ABNORMAL HIGH (ref 150–400)
RBC: 4.53 MIL/uL (ref 4.22–5.81)
RDW: 19.7 % — ABNORMAL HIGH (ref 11.5–15.5)
WBC: 8.8 10*3/uL (ref 4.0–10.5)
nRBC: 0.2 % (ref 0.0–0.2)

## 2021-11-10 LAB — BASIC METABOLIC PANEL
Anion gap: 9 (ref 5–15)
BUN: 18 mg/dL (ref 8–23)
CO2: 23 mmol/L (ref 22–32)
Calcium: 8.9 mg/dL (ref 8.9–10.3)
Chloride: 103 mmol/L (ref 98–111)
Creatinine, Ser: 1.44 mg/dL — ABNORMAL HIGH (ref 0.61–1.24)
GFR, Estimated: 50 mL/min — ABNORMAL LOW (ref >60)
Glucose, Bld: 100 mg/dL — ABNORMAL HIGH (ref 70–99)
Potassium: 3 mmol/L — ABNORMAL LOW (ref 3.5–5.1)
Sodium: 135 mmol/L (ref 135–145)

## 2021-11-10 MED ORDER — POTASSIUM CHLORIDE CRYS ER 20 MEQ PO TBCR
40.0000 meq | EXTENDED_RELEASE_TABLET | Freq: Once | ORAL | Status: AC
  Administered 2021-11-10: 40 meq via ORAL
  Filled 2021-11-10: qty 2

## 2021-11-10 MED ORDER — TRAMADOL HCL 50 MG PO TABS
50.0000 mg | ORAL_TABLET | Freq: Four times a day (QID) | ORAL | 0 refills | Status: DC | PRN
Start: 2021-11-10 — End: 2021-12-05

## 2021-11-10 NOTE — Progress Notes (Signed)
VASCULAR LAB ? ? ? ?Bilateral lower extremity venous duplex has been performed. ? ?See CV proc for preliminary results. ? ?Messaged results to Dr. Rodena Medin via secure chat. ? ?Elfie Costanza, RVT ?11/10/2021, 12:57 PM ? ?

## 2021-11-10 NOTE — ED Notes (Signed)
Pt has 1+ right pedal pulse, cap refill less than 3 sec, warm to touch, pt able to wiggle toes. ?

## 2021-11-10 NOTE — Progress Notes (Signed)
Orthopedic Tech Progress Note ?Patient Details:  ?[name] ?1946-02-02 ?[id number] ? ?Ortho Devices ?Type of Ortho Device: Knee Immobilizer ?Ortho Device/Splint Location: RLE ?Ortho Device/Splint Interventions: Ordered, Application, Adjustment ?  ?Post Interventions ?Patient Tolerated: Well ?Instructions Provided: Care of device ? ?[name] ?11/10/2021, 2:19 PM ? ?

## 2021-11-10 NOTE — Discharge Instructions (Addendum)
Return for any problem.  ?

## 2021-11-10 NOTE — Patient Instructions (Addendum)
To Mr. Trippe,  ? ?It was a pleasure seeing you today. Today we discussed your knee pain. I am concerned that you may have an infection of the knee. We are going to send you to the ED to have them exam your knee and take some fluid to ensure that there is no infection. For your medications, please switch back to your torsemide 20 mg twice daily instead of the increased 40 mg twice daily. We will have you come back in 1 week for reevaluation.  ? ?Have a good day,  ?Dolan Amen, MD ?

## 2021-11-10 NOTE — Progress Notes (Signed)
? ?CC: HF follow up, R knee Pain ? ?HPI: ? ?[name] is a 76 y.o. person, with a PMH noted below, who presents to the clinic heart failure follow-up and acute right knee pain. To see the management of their acute and chronic conditions, please see the A&P note under the Encounters tab.  ? ?Past Medical History:  ?Diagnosis Date  ? Acute GI bleeding 06/27/2021  ? Anemia 01/29/2016  ? Atrial fibrillation ([place])   ? post op.  s/p dc-cv 04/2008. previously on amiodarone  ? Atrial flutter ([place])   ? atypical  ? AV block, 1st degree   ? .450 msec--progressive  ? 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  a- Echo 07/2008 Ef 55% mild peroprosthetic MVR with High transmitral gradient (mean 14).  b- normal coronaries by cath 12/2007  ? BPH (benign prostatic hyperplasia)   ? Cardiac resynchronization therapy pacemaker (CRT-P) in place 06/22/2011  ? CHF (congestive heart failure) ([place])   ? EF 35-40 % 2011 due to valvular disease and diastolic dysfunction  ? Chronic back pain   ? CKD (chronic kidney disease), stage III ([place])   ? COVID-19 virus infection 07/24/2020  ? Tested positive on July 16, 2020  ? Dysphagia   ? with normal barium swallow 09/2008  ? Endocarditis   ? GERD (gastroesophageal reflux disease)   ? Heavy alcohol use   ? history  ? History of atrial flutter 03/13/2008  ? History of atrial flutter - resolved.   ? History of GI bleed   ? Hypertension   ? IV drug abuse ([place])   ? history of  ? Lower GI bleed 01/2016  ? Memory impairment 12/24/2015  ? Pacemaker 2010  ? ?Review of Systems:   ?Review of Systems  ?Constitutional:  Negative for chills, fever, malaise/fatigue and weight loss.  ?Gastrointestinal:  Negative for abdominal pain, diarrhea, nausea and vomiting.  ?Musculoskeletal:  Positive for joint pain.  ?     R knee pain  ?Neurological:  Negative for dizziness, tingling, tremors and headaches.   ? ?Physical Exam: ? ?Vitals:  ? 11/10/21 0925  ?BP: (!) 119/57  ?Pulse: 75   ?Temp: 98.3 ?F (36.8 ?C)  ?TempSrc: Oral  ?SpO2: 97%  ?Height: 5\' 8"  (1.727 m)  ? ?Physical Exam ?Constitutional:   ?   General: He is not in acute distress. ?   Appearance: He is not ill-appearing or toxic-appearing.  ?   Comments: Sitting comfortably in wheel chair, right leg propped up on chair, answering questions appropriately  ?Cardiovascular:  ?   Rate and Rhythm: Normal rate and regular rhythm.  ?   Pulses: Normal pulses.  ?   Heart sounds: Normal heart sounds. No murmur heard. ?  No friction rub. No gallop.  ?Pulmonary:  ?   Effort: Pulmonary effort is normal.  ?Abdominal:  ?   General: Bowel sounds are normal. There is no distension.  ?   Tenderness: There is no abdominal tenderness.  ?Musculoskeletal:  ?   Right lower leg: Edema present.  ?   Left lower leg: Edema present.  ?   Comments: Nonpitting edema up to the knees bilaterally, mild warmth of the R knee compared to left, R knee swollen compared to L knee, difficult to ascertain effusion secondary to chronic edema. Patient unable to bear weight on affected extremity.   ?Skin: ?   General: Skin is warm.  ?   Findings: No erythema, lesion  or rash.  ?Neurological:  ?   Mental Status: He is alert and oriented to person, place, and time.  ?Psychiatric:     ?   Mood and Affect: Mood normal.     ?   Behavior: Behavior normal.  ?  ? ?Assessment & Plan:  ? ?See Encounters Tab for problem based charting. ? ?Patient seen with Dr. Dareen Piano ? ?

## 2021-11-10 NOTE — Assessment & Plan Note (Addendum)
[name] presents with acute right knee pain that started this morning when he first woke up. The pain woke him up from his sleep. He states that he was unable to bear weight on the affected extremity. He states that the pain is an 8 out of 10 at rest, 10 out of 10 when standing. The pain radiates down to towards his right shin.  He denies any falls, fevers, nausea, vomiting, or new rashes.  He has not tried anything for pain this morning. ? ?A/P: ?Patient with stage III kidney disease, pre-diabetes (last A1c was 5.3), and history of alcohol use, presents with acute right knee pain starting this morning. In the setting of recent hospitalization requiring diuretics, and increased diuretic regimen secondary to his HF, his findings could be secondary to diuretic induced gout. We will need further evaluation with an arthrocentesis with fluid studies to rule out septic joint.  Patient to present to the ED for arthrocentesis and fluid studies. ?- Patient to go to ED ?- Appreciate arthrocentesis and evaluation for gout versus septic joint ?

## 2021-11-10 NOTE — ED Triage Notes (Signed)
Pt reports right lower leg pain. Equal sensations in legs and able to move leg. Endorses soreness through knee and down leg.  ?

## 2021-11-10 NOTE — Assessment & Plan Note (Addendum)
Patient presents for reevaluation of his heart failure.  Unfortunately, secondary to his acute knee pain, we did not get a focus on further discussion of his heart failure medications.  His torsemide was increased at last visit to 40 mg twice daily for 5 days secondary to a 12 pound weight gain.  Vitals appear stable today.  Patient was sent to the ED for further evaluation of his new knee pain.  Discussed with the patient to decrease the torsemide to 20 mg twice daily for now, will need further evaluation in a week's time, hopefully when he is able to stand. ?- Torsemide 20 mg for 5 days ?- Continue Coreg 6.25 mg daily ?- Follow-up for evaluation in a week ?- If BP allows, would like to start low-dose spironolactone and discussed starting SGLT2 inhibitor. ?

## 2021-11-10 NOTE — ED Provider Notes (Signed)
[facility] EMERGENCY DEPARTMENT Provider Note   CSN: [id number] Arrival date & time: 11/10/21  1011     History  Chief Complaint  Patient presents with   Leg Pain    [name] is a 76 y.o. male.  76 year old male with prior medical history as detailed below presents for evaluation of right knee pain.  Patient with longstanding history of right knee pain.  Patient reports increased right knee pain over the last several days.  He uses a cane at baseline.  He denies any specific injury.  Apparently patient presented in an outpatient clinic for evaluation.  Patient was referred to the ED for evaluation.  Clinic provider was concerned about possibility of septic arthritis of the right knee.  Patient denies any fever.  He reports increased pain with attempted standing.  He did not take anything for his pain.  The history is provided by the patient and medical records.  Leg Pain Location:  Knee Knee location:  R knee Pain details:    Quality:  Aching   Radiates to:  Does not radiate   Severity:  Moderate   Onset quality:  Unable to specify   Timing:  Unable to specify   Progression:  Unable to specify     Home Medications Prior to Admission medications   Medication Sig Start Date End Date Taking? Authorizing Provider  acetaminophen (TYLENOL) 500 MG tablet Take 1,000 mg by mouth every 6 (six) hours as needed (pain).    [provider]  atorvastatin (LIPITOR) 20 MG tablet Take 1 tablet (20 mg total) by mouth every evening. 09/16/21   Champ Mungo, DO  carvedilol (COREG) 6.25 MG tablet Take 1 tablet (6.25 mg total) by mouth 2 (two) times daily with a meal. 12/16/20   Roylene Reason, MD  diclofenac Sodium (VOLTAREN) 1 % GEL APPLY 4 G TOPICALLY 4 TIMES DAILY 03/12/21   Dellia Cloud, MD  pantoprazole (PROTONIX) 40 MG tablet TAKE 1 TABLET (40 MG TOTAL) BY MOUTH TWICE A DAY BEFORE MEALS 10/15/21   Dellia Cloud, MD  tamsulosin (FLOMAX) 0.4 MG CAPS capsule  Take 2 capsules (0.8 mg total) by mouth daily after breakfast. 08/29/21   Demaio, Alexa, MD  torsemide (DEMADEX) 20 MG tablet TAKE 1 TABLET BY MOUTH TWICE A DAY Patient taking differently: Reported MD recently increased dose to 2 tablets (40 mg) twice daily. 09/26/21 12/25/21  Dellia Cloud, MD  warfarin (COUMADIN) 5 MG tablet Take warfarin 5 mg tab evening of 4/23 and follow-up in [facility] on Monday with Dr. [name]. 10/26/21   Masters, Katie, DO      Allergies    Patient has no known allergies.    Review of Systems   Review of Systems  All other systems reviewed and are negative.  Physical Exam Updated Vital Signs BP 119/64   Pulse 89   Temp 98.6 F (37 C) (Oral)   Resp 16   Ht 5\' 8"  (1.727 m)   Wt 86 kg   SpO2 98%   BMI 28.83 kg/m  Physical Exam Vitals and nursing note reviewed.  Constitutional:      General: He is not in acute distress.    Appearance: Normal appearance. He is well-developed.  HENT:     Head: Normocephalic and atraumatic.  Eyes:     Conjunctiva/sclera: Conjunctivae normal.     Pupils: Pupils are equal, round, and reactive to light.  Cardiovascular:     Rate and Rhythm: Normal  rate and regular rhythm.     Heart sounds: Normal heart sounds.  Pulmonary:     Effort: Pulmonary effort is normal. No respiratory distress.     Breath sounds: Normal breath sounds.  Abdominal:     General: There is no distension.     Palpations: Abdomen is soft.     Tenderness: There is no abdominal tenderness.  Musculoskeletal:        General: No deformity. Normal range of motion.     Cervical back: Normal range of motion and neck supple.     Comments: Mild tenderness with palpation overlying the anterior aspect of the right knee -no palpable effusion.  No erythema.  Patient's maximal tenderness is overlying the anterior aspect of the proximal tibia near insertion site of patellar tendon.  Active range of motion appears to not be limited by pain.   Skin:    General: Skin is warm and dry.  Neurological:     General: No focal deficit present.     Mental Status: He is alert and oriented to person, place, and time.    ED Results / Procedures / Treatments   Labs (all labs ordered are listed, but only abnormal results are displayed) Labs Reviewed  CBC - Abnormal; Notable for the following components:      Result Value   Hemoglobin 9.0 (*)    HCT 33.6 (*)    MCV 74.2 (*)    MCH 19.9 (*)    MCHC 26.8 (*)    RDW 19.7 (*)    Platelets 412 (*)    All other components within normal limits  BASIC METABOLIC PANEL - Abnormal; Notable for the following components:   Potassium 3.0 (*)    Glucose, Bld 100 (*)    Creatinine, Ser 1.44 (*)    GFR, Estimated 50 (*)    All other components within normal limits    EKG None  Radiology DG Knee Complete 4 Views Right  Result Date: 11/10/2021 CLINICAL DATA:  Right knee pain. EXAM: RIGHT KNEE - COMPLETE 4+ VIEW COMPARISON:  None Available. FINDINGS: There is moderate chronic ossification of the distal aspect of the patellar tendon insertion on the superior tibial tubercle. Mild chronic enthesopathic change at the quadriceps insertion on the patella. No joint effusion. Mild soft tissue swelling anterior to the patellar tendon. Mild medial joint space narrowing with moderate peripheral degenerative osteophytes. Mild-to-moderate anterior degenerative spurring/hypertrophy of the trochleae on lateral view. No acute fracture or dislocation. IMPRESSION:: IMPRESSION: 1. Chronic ossification at the distal patellar tendon insertion on the tibial tubercle, likely the sequela of remote Osgood-Schlatter disease. 2. Mild soft tissue swelling anterior to the patellar tendon. 3. Mild-to-moderate medial compartment and mild patellofemoral compartment osteoarthritis. Electronically Signed   By: Neita Garnet M.D.   On: 11/10/2021 11:32    Procedures Procedures    Medications Ordered in ED Medications  potassium  chloride SA (KLOR-CON M) CR tablet 40 mEq (40 mEq Oral Given 11/10/21 1225)    ED Course/ Medical Decision Making/ A&P                           Medical Decision Making Risk Prescription drug management.    Medical Screen Complete  This patient presented to the ED with complaint of right knee pain.  This complaint involves an extensive number of treatment options. The initial differential diagnosis includes, but is not limited to, right knee pain  This presentation is: Chronic,  Self-Limited, Previously Undiagnosed, Uncertain Prognosis, and Complicated  Patient presents with complaint of right knee pain.  Pain appears acute on chronic.  Patient without systemic symptoms such as fever, etc.  Maximal tenderness on exam over the anterior right proximal tibia - at site of patellar tendon insertion.  There is no effusion, erythema, or impaired ROM of the right knee. Patient is able to ambulate with his cane (at his baseline) without difficulty.  No indication of infectious process per history or on exam.  Option of attempt at obtaining fluid from knee for analysis discussed with patient. He declines attempt at knee tap.  Patient feels improved post ED evaluation and desires DC home. He understands need for close FU. Strict return precautions given and understood.    Additional history obtained:  External records from outside sources obtained and reviewed including prior ED visits and prior Inpatient records.    Lab Tests:  I ordered and personally interpreted labs.  The pertinent results include:  CBC BMP   Imaging Studies ordered:  I ordered imaging studies including Xray Knee, US DVT RO  I independently visualized and interpreted obtained imaging which showed NAD I agree with the radiologist interpretation. Problem List / ED Course:  Right knee pain   Reevaluation:  After the interventions noted above, I reevaluated the patient and found that they have:  improved   Disposition:  After consideration of the diagnostic results and the patients response to treatment, I feel that the patent would benefit from close outpatient followup.          Final Clinical Impression(s) / ED Diagnoses Final diagnoses:  Acute pain of right knee    Rx / DC Orders ED Discharge Orders          Ordered    traMADol (ULTRAM) 50 MG tablet  Every 6 hours PRN        11/10/21 1321              Wynetta Fines, MD 11/11/21 1301

## 2021-11-10 NOTE — ED Provider Triage Note (Signed)
Emergency Medicine Provider Triage Evaluation Note ? ?[name] , a 76 y.o. male  was evaluated in triage.  Pt complains of right leg pain, right knee pain. Sent from PCP for evaluation for septic arthritis of knee. Patient denies numbness, tingling. Does have hx of IVDU. Takes warfarin. ? ?Review of Systems  ?Positive: Knee pain ?Negative: Fever, chills ? ?Physical Exam  ?BP 131/90   Pulse 61   Temp 98.6 ?F (37 ?C) (Oral)   Resp 17   Ht 5\' 8"  (1.727 m)   Wt 86 kg   SpO2 99%   BMI 28.83 kg/m?  ?Gen:   Awake, no distress   ?Resp:  Normal effort  ?MSK:   Decreased strength of RLE secondary to pain ?Other:  Some swelling at knee, entire RLE somewhat hot to touch. Intact PT, DP pulses ? ?Medical Decision Making  ?Medically screening exam initiated at 10:49 AM.  Appropriate orders placed.  was informed that the remainder of the evaluation will be completed by another provider, this initial triage assessment does not replace that evaluation, and the importance of remaining in the ED until their evaluation is complete. ? ?Workup initiated ?  ?Pheonix Wisby H, PA-C ?11/10/21 1051 ? ?

## 2021-11-11 NOTE — Progress Notes (Signed)
Internal Medicine Clinic Attending ? ?Case discussed with Dr. Winters  At the time of the visit.  We reviewed the resident?s history and exam and pertinent patient test results.  I agree with the assessment, diagnosis, and plan of care documented in the resident?s note.  ?

## 2021-11-12 NOTE — Progress Notes (Signed)
Internal Medicine Clinic Attending ? ?Case discussed with Dr. Winters  At the time of the visit.  We reviewed the resident?s history and exam and pertinent patient test results.  I agree with the assessment, diagnosis, and plan of care documented in the resident?s note.  ?

## 2021-11-13 ENCOUNTER — Telehealth: Payer: Self-pay | Admitting: *Deleted

## 2021-11-13 NOTE — Telephone Encounter (Signed)
Patient's EC called in stating patient's "knee caps and both legs are so swollen he can hardly walk." States swelling has not changed from OV on 5/8. Denies SHOB. Taking torsemide 1 tab BID. Patient is due for 1 week f/u on 5/15. Appt given for 5/15 at 1345 with same Provider. ?

## 2021-11-17 ENCOUNTER — Telehealth: Payer: 59

## 2021-11-17 ENCOUNTER — Encounter: Payer: 59 | Admitting: Internal Medicine

## 2021-11-19 ENCOUNTER — Encounter: Payer: Self-pay | Admitting: Internal Medicine

## 2021-11-24 ENCOUNTER — Ambulatory Visit (INDEPENDENT_AMBULATORY_CARE_PROVIDER_SITE_OTHER): Payer: 59 | Admitting: Pharmacist

## 2021-11-24 ENCOUNTER — Ambulatory Visit: Payer: 59

## 2021-11-24 DIAGNOSIS — Z952 Presence of prosthetic heart valve: Secondary | ICD-10-CM

## 2021-11-24 DIAGNOSIS — Z8679 Personal history of other diseases of the circulatory system: Secondary | ICD-10-CM

## 2021-11-24 DIAGNOSIS — Z7901 Long term (current) use of anticoagulants: Secondary | ICD-10-CM | POA: Diagnosis not present

## 2021-11-24 LAB — POCT INR: INR: 2.8 (ref 2.0–3.0)

## 2021-11-24 NOTE — Patient Instructions (Signed)
Patient instructed to take medications as defined in the Anti-coagulation Track section of this encounter.  Patient instructed to take today's dose.  Patient instructed to take one (1) tablet Mondays, Tuesdays, Wednesdays, Thursdays and Fridays. On Saturdays and Sundays, take ONLY 1/2 tablet.  Patient verbalized understanding of these instructions.

## 2021-11-24 NOTE — Chronic Care Management (AMB) (Signed)
Care Management    RN Visit Note  11/24/2021 Name: [name] MRN: [id number] DOB: 05/23/1946  Subjective: [name] is a 76 y.o. year old male who is a primary care patient of [name], MD. The care management team was consulted for assistance with disease management and care coordination needs.    Engaged with patient by telephone for follow up visit in response to provider referral for case management and/or care coordination services.   Consent to Services:   [name] was given information about Care Management services today including:  Care Management services includes personalized support from designated clinical staff supervised by his physician, including individualized plan of care and coordination with other care providers 24/7 contact phone numbers for assistance for urgent and routine care needs. The patient may stop case management services at any time by phone call to the office staff.  Patient agreed to services and consent obtained.   Assessment: Review of patient past medical history, allergies, medications, health status, including review of consultants reports, laboratory and other test data, was performed as part of comprehensive evaluation and provision of chronic care management services.   SDOH (Social Determinants of Health) assessments and interventions performed:    Care Plan  No Known Allergies  Outpatient Encounter Medications as of 11/24/2021  Medication Sig   acetaminophen (TYLENOL) 500 MG tablet Take 1,000 mg by mouth every 6 (six) hours as needed (pain).   atorvastatin (LIPITOR) 20 MG tablet Take 1 tablet (20 mg total) by mouth every evening.   carvedilol (COREG) 6.25 MG tablet Take 1 tablet (6.25 mg total) by mouth 2 (two) times daily with a meal.   diclofenac Sodium (VOLTAREN) 1 % GEL APPLY 4 G TOPICALLY 4 TIMES DAILY   pantoprazole (PROTONIX) 40 MG tablet TAKE 1 TABLET (40 MG TOTAL) BY MOUTH TWICE A DAY BEFORE MEALS   tamsulosin  (FLOMAX) 0.4 MG CAPS capsule Take 2 capsules (0.8 mg total) by mouth daily after breakfast.   torsemide (DEMADEX) 20 MG tablet TAKE 1 TABLET BY MOUTH TWICE A DAY (Patient taking differently: Reported MD recently increased dose to 2 tablets (40 mg) twice daily.)   traMADol (ULTRAM) 50 MG tablet Take 1 tablet (50 mg total) by mouth every 6 (six) hours as needed.   warfarin (COUMADIN) 5 MG tablet Take warfarin 5 mg tab evening of 4/23 and follow-up in [facility] on Monday with Dr. Elie Confer.   No facility-administered encounter medications on file as of 11/24/2021.    Patient Active Problem List   Diagnosis Date Noted   Right knee pain 11/10/2021   Sleep disturbances 11/05/2021   Atrial fibrillation ([place]) 10/24/2021   Acute exacerbation of CHF (congestive heart failure) ([place]) 10/24/2021   Pre-diabetes 09/23/2021   TIA (transient ischemic attack) 09/15/2021   GI bleed 06/27/2021   Inguinal hernia, right 04/21/2021   Lower leg pain 02/12/2021   Stage 3b chronic kidney disease (CKD) ([place]) 03/26/2016   Dementia ([place]) 12/24/2015   H/O mitral valve replacement with mechanical valve 11/13/2015   Benign prostatic hyperplasia with nocturia 11/13/2015   Healthcare maintenance 11/13/2015   History of arteriovenous malformation (AVM) 03/28/2014   Chronic biventricular, systolic heart failure ([place]) 06/02/2011   Essential hypertension 09/09/2008   History of atrial flutter 03/13/2008    Conditions to be addressed/monitored: CHF, HTN, CKD Stage 3, and Cognitive decline  Care Plan : RN Care Manager Plan of Care  Updates made by Johnney Killian, RN since 11/24/2021  12:00 AM     Problem: Health Promotion or Disease Self-Management (General Plan of Care)      Goal: Chronic Disease Management and Care Coordination Needs (CHF, CKD3, HTN, Cognitive decline)   Start Date: 10/03/2021  Recent Progress: Not on track  Note:   Current Barriers: Successful outreach to [employer]  (patient's spouse).  She said patient was not with her at this time as she has the primary number.  She says patient had appointment at the clinic for Coumadin check and he is doing well.  Explained this was a follow up call and I would reach out to him another time. Knowledge Deficits related to plan of care for management of CHF, HTN, CKD Stage 3, and Cognitive Issues [facility] 13/30)  Chronic Disease Management support and education needs related to CHF, HTN, CKD Stage 3, and Cognitive Issues  Cognitive Deficits RNCM Clinical Goal(s):  Patient will verbalize basic understanding of  CHF, HTN, CKD Stage 3, and Cognitive decline disease process and self health management plan as evidenced by discussions with RNCM. attend all scheduled medical appointments:   as evidenced by 0 no shows demonstrate Improved health management independence as evidenced by obtaining scale and weigh yourself daily, doculent weights. continue to work with RN Care Manager to address care management and care coordination needs related to  CHF, HTN, CKD Stage 3, and cognitive decline as evidenced by adherence to CM Team Scheduled appointments not experience hospital admission as evidenced by review of EMR. Hospital Admissions in last 6 months = 3 and 2 ED visits demonstrate a decrease in CHF, HTN, CKD Stage 3, and cognitive decline exacerbations as evidenced by decrease in need for ED visits  through collaboration with RN Care manager, provider, and care team.  Interventions: 1:1 collaboration with primary care provider regarding development and update of comprehensive plan of care as evidenced by provider attestation and co-signature Inter-disciplinary care team collaboration (see longitudinal plan of care) Evaluation of current treatment plan related to  self management and patient's adherence to plan as established by provider Heart Failure Interventions:  (Status:  Goal on track:  NO.) Long Term Goal Basic overview and  discussion of pathophysiology of Heart Failure reviewed Provided education on low sodium diet Assessed need for readable accurate scales in home Provided education about placing scale on hard, flat surface Advised patient to weigh each morning after emptying bladder Discussed importance of daily weight and advised patient to weigh and record daily Reviewed role of diuretics in prevention of fluid overload and management of heart failure; Discussed the importance of keeping all appointments with provider Provided patient with education about the role of exercise in the management of heart failure Assessed social determinant of health barriers   Hypertension Interventions:  (Status:  Goal on track:  Yes.) Long Term Goal Last practice recorded BP readings:  BP Readings from Last 3 Encounters:  09/30/21 (!) 119/55  09/23/21 121/62  09/16/21 121/72  Most recent eGFR/CrCl:  Lab Results  Component Value Date   EGFR 42 (L) 09/30/2021    No components found for: CRCL  Evaluation of current treatment plan related to hypertension self management and patient's adherence to plan as established by provider Counseled on the importance of exercise goals with target of 150 minutes per week Discussed plans with patient for ongoing care management follow up and provided patient with direct contact information for care management team Assessed social determinant of health barriers  Cognitive Decline:  (Status:  Goal on track:  NO.)  Long Term Goal Evaluation of current treatment plan related to: Cognitive Decline Discussed importance of discussing diagnosis with provider, Discussed importance of attendance to all provider appointments, and Advised to contact provider for new or worsening symptoms  Patient Goals/Self-Care Activities: Take all medications as prescribed Attend all scheduled provider appointments Call pharmacy for medication refills 3-7 days in advance of running out of  medications Perform all self care activities independently  Call provider office for new concerns or questions  Work with the social worker to address care coordination needs and will continue to work with the clinical team to address health care and disease management related needs call doctor for signs and symptoms of high blood pressure keep all doctor appointments take medications for blood pressure exactly as prescribed  Follow Up Plan:  The patient has been provided with contact information for the care management team and has been advised to call with any health related questions or concerns.          Plan: The patient has been provided with contact information for the care management team and has been advised to call with any health related questions or concerns.   Johnney Killian, RN, BSN, CCM Care Management Coordinator [facility] Phone: [phone]: [phone]

## 2021-11-24 NOTE — Patient Instructions (Signed)
  Our next appointment is by telephone on 12/29/21 at 11:00 am.  Please call the care guide team at [phone] if you need to cancel or reschedule your appointment.   If you are experiencing a Mental Health or Behavioral Health Crisis or need someone to talk to, please call the [place] National Suicide Prevention Lifeline: (864)025-1279 or TTY: (769)221-8383 TTY 602-025-4995) to talk to a trained counselor   The patient has been provided with contact information for the care management team and has been advised to call with any health related questions or concerns.   Jodelle Gross, RN, BSN, CCM Care Management Coordinator [facility] Phone: [phone]/Fax: [phone]

## 2021-11-24 NOTE — Progress Notes (Signed)
Anticoagulation Management [name] is a 76 y.o. male who reports to the clinic for monitoring of warfarin treatment.    Indication:  Mitral valve replacement; long term current use of oral anticoagulant, warfarin--target INR 2.5 - 3.5.   Duration: indefinite Supervising physician: Aldine Contes  Anticoagulation Clinic Visit History: Patient does not report signs/symptoms of bleeding or thromboembolism  Other recent changes: No diet, medications, lifestyle changes cited by the patient at this visit.  Anticoagulation Episode Summary     Current INR goal:  2.5-3.5  TTR:  60.2 % (9.2 y)  Next INR check:  12/22/2021  INR from last check:  2.8 (11/24/2021)  Weekly max warfarin dose:    Target end date:  Indefinite  INR check location:  Anticoagulation Clinic  Preferred lab:    Send INR reminders to:  ANTICOAG IMP   Indications   History of atrial flutter [Z86.79] Long term (current) use of anticoagulants (Resolved) [Z79.01] ATRIAL FLUTTER (Resolved) [I48.92] H/O mitral valve replacement with mechanical valve [Z95.2]        Comments:          Anticoagulation Care Providers     Provider Role Specialty Phone number   Jolaine Artist, MD  Cardiology [phone]       No Known Allergies  Current Outpatient Medications:    acetaminophen (TYLENOL) 500 MG tablet, Take 1,000 mg by mouth every 6 (six) hours as needed (pain)., Disp: , Rfl:    atorvastatin (LIPITOR) 20 MG tablet, Take 1 tablet (20 mg total) by mouth every evening., Disp: 30 tablet, Rfl: 2   carvedilol (COREG) 6.25 MG tablet, Take 1 tablet (6.25 mg total) by mouth 2 (two) times daily with a meal., Disp: 180 tablet, Rfl: 3   diclofenac Sodium (VOLTAREN) 1 % GEL, APPLY 4 G TOPICALLY 4 TIMES DAILY, Disp: 400 g, Rfl: 11   pantoprazole (PROTONIX) 40 MG tablet, TAKE 1 TABLET (40 MG TOTAL) BY MOUTH TWICE A DAY BEFORE MEALS, Disp: 180 tablet, Rfl: 1   tamsulosin (FLOMAX) 0.4 MG CAPS capsule, Take 2 capsules (0.8  mg total) by mouth daily after breakfast., Disp: 180 capsule, Rfl: 3   torsemide (DEMADEX) 20 MG tablet, TAKE 1 TABLET BY MOUTH TWICE A DAY (Patient taking differently: Reported MD recently increased dose to 2 tablets (40 mg) twice daily.), Disp: 180 tablet, Rfl: 0   traMADol (ULTRAM) 50 MG tablet, Take 1 tablet (50 mg total) by mouth every 6 (six) hours as needed., Disp: 12 tablet, Rfl: 0   warfarin (COUMADIN) 5 MG tablet, Take warfarin 5 mg tab evening of 4/23 and follow-up in [facility] on Monday with Dr. Elie Confer., Disp: 30 tablet, Rfl: 11 Past Medical History:  Diagnosis Date   Acute GI bleeding 06/27/2021   Anemia 01/29/2016   Atrial fibrillation ([place])    post op.  s/p dc-cv 04/2008. 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  a- Echo 07/2008 Ef 55% mild peroprosthetic MVR with High transmitral gradient (mean 14).  b- normal coronaries by cath 12/2007   BPH (benign prostatic hyperplasia)    Cardiac resynchronization therapy pacemaker (CRT-P) in place 06/22/2011   CHF (congestive heart failure) ([facility])    EF 35-40 % 2011 due to valvular disease and diastolic dysfunction   Chronic back pain  CKD (chronic kidney disease), stage III ([place])    COVID-19 virus infection 07/24/2020   Tested positive on July 16, 2020   Dysphagia    with normal barium swallow 09/2008   Endocarditis    GERD (gastroesophageal reflux disease)    Heavy alcohol use    history   History of atrial flutter 03/13/2008   History of atrial flutter - resolved.    History of GI bleed    Hypertension    IV drug abuse ([place])    history of   Lower GI bleed 01/2016   Memory impairment 12/24/2015   Pacemaker 2010   Social History   Socioeconomic History   Marital status: Divorced    Spouse name: Not on file   Number of children: 9   Years of education:  Not on file   Highest education level: Not on file  Occupational History   Occupation: retired    Comment: [employer]  Tobacco Use   Smoking status: Former    Years: 0.00    Types: Cigarettes   Smokeless tobacco: Never  Vaping Use   Vaping Use: Never used  Substance and Sexual Activity   Alcohol use: No    Alcohol/week: 0.0 standard drinks   Drug use: No   Sexual activity: Not Currently  Other Topics Concern   Not on file  Social History Narrative   Current Social History 02/16/2019        Patient lives with family (baby's 69, daughter and 4 grandkids:  79 yo twins, 36 yo and 52 yo in a home which is 1 story. There are 4 steps with handrails up to the entrance the patient uses.       Patient's method of transportation is via family member (baby's mama).      The highest level of education was high school diploma.      The patient currently retired.      Identified important Relationships are "My 60 Joselyn Arrow, [name]."       Pets : None       Interests / Fun: Sitting outdoors watching the birds and planes."       Exercise riding a bike 5 miles 3 days/week      Current Stressors: The grandkids       Religious / Personal Beliefs: "I believe in God."       L. Ducatte, RN, BSN       Social Determinants of Health   Financial Resource Strain: Low Risk    Difficulty of Paying Living Expenses: Not very hard  Food Insecurity: No Food Insecurity   Worried About Charity fundraiser in the Last Year: Never true   Arboriculturist in the Last Year: Never true  Transportation Needs: No Transportation Needs   Lack of Transportation (Medical): No   Lack of Transportation (Non-Medical): No  Physical Activity: Sufficiently Active   Days of Exercise per Week: 3 days   Minutes of Exercise per Session: 50 min  Stress: Not on file  Social Connections: Unknown   Frequency of Communication with Friends and Family: More than three times a week   Frequency of Social Gatherings  with Friends and Family: More than three times a week   Attends Religious Services: Not on Electrical engineer or Organizations: No   Attends Archivist Meetings: Never   Marital Status: Not on file   Family History  Problem Relation Age of Onset  Coronary artery disease Mother    Cancer Father        GI   Cancer Brother        Lung    ASSESSMENT Recent Results: The most recent result is correlated with 30 mg per week: Lab Results  Component Value Date   INR 2.8 11/24/2021   INR 2.4 11/03/2021   INR 2.1 10/28/2021    Anticoagulation Dosing: Description   Take one (1) tablet Mondays, Tuesdays, Wednesdays, Thursdays and Fridays. On Saturdays and Sundays, take ONLY 1/2 tablet.      INR today: Therapeutic  PLAN Weekly dose was unchanged.   Patient Instructions  Patient instructed to take medications as defined in the Anti-coagulation Track section of this encounter.  Patient instructed to take today's dose.  Patient instructed to take one (1) tablet Mondays, Tuesdays, Wednesdays, Thursdays and Fridays. On Saturdays and Sundays, take ONLY 1/2 tablet.  Patient verbalized understanding of these instructions.   Patient advised to contact clinic or seek medical attention if signs/symptoms of bleeding or thromboembolism occur.  Patient verbalized understanding by repeating back information and was advised to contact me if further medication-related questions arise. Patient was also provided an information handout.  Follow-up Return in 4 weeks (on 12/22/2021) for Follow up INR.  Pennie Banter, PharmD, CPP  15 minutes spent face-to-face with the patient during the encounter. 50% of time spent on education, including signs/sx bleeding and clotting, as well as food and drug interactions with warfarin. 50% of time was spent on fingerprick POC INR sample collection,processing, results determination, and documentation in http://www.kim.net/.

## 2021-11-25 ENCOUNTER — Ambulatory Visit (INDEPENDENT_AMBULATORY_CARE_PROVIDER_SITE_OTHER): Payer: 59

## 2021-11-25 DIAGNOSIS — Z95810 Presence of automatic (implantable) cardiac defibrillator: Secondary | ICD-10-CM | POA: Diagnosis not present

## 2021-11-25 LAB — CUP PACEART REMOTE DEVICE CHECK
Battery Remaining Longevity: 84 mo
Battery Remaining Percentage: 62 %
Battery Voltage: 2.99 V
Date Time Interrogation Session: 20230523020013
Implantable Lead Implant Date: 20120905
Implantable Lead Implant Date: 20120905
Implantable Lead Implant Date: 20120905
Implantable Lead Location: 753858
Implantable Lead Location: 753859
Implantable Lead Location: 753860
Implantable Pulse Generator Implant Date: 20190610
Lead Channel Impedance Value: 380 Ohm
Lead Channel Impedance Value: 630 Ohm
Lead Channel Pacing Threshold Amplitude: 1 V
Lead Channel Pacing Threshold Amplitude: 2 V
Lead Channel Pacing Threshold Pulse Width: 0.5 ms
Lead Channel Pacing Threshold Pulse Width: 0.6 ms
Lead Channel Sensing Intrinsic Amplitude: 12 mV
Lead Channel Setting Pacing Amplitude: 0.25 V
Lead Channel Setting Pacing Amplitude: 2 V
Lead Channel Setting Pacing Pulse Width: 0.3 ms
Lead Channel Setting Pacing Pulse Width: 0.5 ms
Lead Channel Setting Sensing Sensitivity: 2 mV
Pulse Gen Model: 3222
Pulse Gen Serial Number: 9022287

## 2021-11-26 NOTE — Progress Notes (Signed)
INTERNAL MEDICINE TEACHING ATTENDING ADDENDUM - Ardice Boyan M.D  Duration- indefinite, Indication- mechanical MVR, atrial flutter, INR- therapeutic. Agree with pharmacy recommendations as outlined in their note.

## 2021-11-27 ENCOUNTER — Institutional Professional Consult (permissible substitution): Payer: 59 | Admitting: Neurology

## 2021-11-28 ENCOUNTER — Ambulatory Visit: Payer: 59 | Admitting: Licensed Clinical Social Worker

## 2021-11-28 NOTE — Chronic Care Management (AMB) (Signed)
  Care Management   Social Work Visit Note  11/28/2021 Name: WILDER KUROWSKI MRN: [id number] DOB: [birth year]  [name] is a 76 y.o. year old male who sees Dellia Cloud, MD for primary care. The care management team was consulted for assistance with care management and care coordination needs related to Initial Outreach.  Patient was given the following information about care management and care coordination services today, agreed to services, and gave verbal consent: 1.care management/care coordination services include personalized support from designated clinical staff supervised by their physician, including individualized plan of care and coordination with other care providers 2. 24/7 contact phone numbers for assistance for urgent and routine care needs. 3. The patient may stop care management/care coordination services at any time by phone call to the office staff.  Engaged with patient by telephone for initial visit in response to provider referral for social work chronic care management and care coordination services.  Assessment: Review of patient history, allergies, and health status during evaluation of patient need for care management/care coordination services.    Interventions:  Patient interviewed and appropriate assessments performed Collaborated with clinical team regarding patient needs  SW completed SDOH assessment. Patient denied any needs.  SW discussed plans for patients birthday. Patient undecided if he was having a party. Patient has a support system living in the home.  Patient advised if he had additional needs he would let SW know.   SDOH (Social Determinants of Health) assessments performed: Yes     Plan:  No further follow up at this time.   Ander Gaster, MSW  Social Worker [facility] Care Management  [phone]

## 2021-12-01 ENCOUNTER — Emergency Department (HOSPITAL_COMMUNITY): Payer: 59

## 2021-12-01 ENCOUNTER — Other Ambulatory Visit: Payer: Self-pay

## 2021-12-01 ENCOUNTER — Inpatient Hospital Stay (HOSPITAL_COMMUNITY)
Admission: EM | Admit: 2021-12-01 | Discharge: 2021-12-05 | DRG: 291 | Disposition: A | Discharge status: Home / Self Care | Payer: 59 | Attending: Student in an Organized Health Care Education/Training Program | Admitting: Student in an Organized Health Care Education/Training Program

## 2021-12-01 ENCOUNTER — Encounter (HOSPITAL_COMMUNITY): Payer: Self-pay

## 2021-12-01 DIAGNOSIS — R0902 Hypoxemia: Secondary | ICD-10-CM

## 2021-12-01 DIAGNOSIS — K219 Gastro-esophageal reflux disease without esophagitis: Secondary | ICD-10-CM | POA: Diagnosis present

## 2021-12-01 DIAGNOSIS — M549 Dorsalgia, unspecified: Secondary | ICD-10-CM | POA: Diagnosis present

## 2021-12-01 DIAGNOSIS — N400 Enlarged prostate without lower urinary tract symptoms: Secondary | ICD-10-CM | POA: Diagnosis present

## 2021-12-01 DIAGNOSIS — S0990XA Unspecified injury of head, initial encounter: Principal | ICD-10-CM

## 2021-12-01 DIAGNOSIS — I509 Heart failure, unspecified: Secondary | ICD-10-CM

## 2021-12-01 DIAGNOSIS — G9341 Metabolic encephalopathy: Secondary | ICD-10-CM | POA: Diagnosis present

## 2021-12-01 DIAGNOSIS — Z8616 Personal history of COVID-19: Secondary | ICD-10-CM

## 2021-12-01 DIAGNOSIS — I4430 Unspecified atrioventricular block: Secondary | ICD-10-CM | POA: Diagnosis present

## 2021-12-01 DIAGNOSIS — W19XXXD Unspecified fall, subsequent encounter: Secondary | ICD-10-CM

## 2021-12-01 DIAGNOSIS — I130 Hypertensive heart and chronic kidney disease with heart failure and stage 1 through stage 4 chronic kidney disease, or unspecified chronic kidney disease: Principal | ICD-10-CM | POA: Diagnosis present

## 2021-12-01 DIAGNOSIS — G8929 Other chronic pain: Secondary | ICD-10-CM | POA: Diagnosis present

## 2021-12-01 DIAGNOSIS — Z952 Presence of prosthetic heart valve: Secondary | ICD-10-CM

## 2021-12-01 DIAGNOSIS — I4821 Permanent atrial fibrillation: Secondary | ICD-10-CM | POA: Diagnosis present

## 2021-12-01 DIAGNOSIS — Y92003 Bedroom of unspecified non-institutional (private) residence as the place of occurrence of the external cause: Secondary | ICD-10-CM

## 2021-12-01 DIAGNOSIS — I4892 Unspecified atrial flutter: Secondary | ICD-10-CM | POA: Diagnosis present

## 2021-12-01 DIAGNOSIS — Z7901 Long term (current) use of anticoagulants: Secondary | ICD-10-CM

## 2021-12-01 DIAGNOSIS — D509 Iron deficiency anemia, unspecified: Secondary | ICD-10-CM | POA: Diagnosis present

## 2021-12-01 DIAGNOSIS — R5381 Other malaise: Secondary | ICD-10-CM

## 2021-12-01 DIAGNOSIS — I5043 Acute on chronic combined systolic (congestive) and diastolic (congestive) heart failure: Secondary | ICD-10-CM | POA: Diagnosis present

## 2021-12-01 DIAGNOSIS — I5022 Chronic systolic (congestive) heart failure: Secondary | ICD-10-CM

## 2021-12-01 DIAGNOSIS — W19XXXA Unspecified fall, initial encounter: Secondary | ICD-10-CM

## 2021-12-01 DIAGNOSIS — W06XXXA Fall from bed, initial encounter: Secondary | ICD-10-CM | POA: Diagnosis present

## 2021-12-01 DIAGNOSIS — F17210 Nicotine dependence, cigarettes, uncomplicated: Secondary | ICD-10-CM | POA: Diagnosis present

## 2021-12-01 DIAGNOSIS — N1832 Chronic kidney disease, stage 3b: Secondary | ICD-10-CM | POA: Diagnosis present

## 2021-12-01 DIAGNOSIS — I428 Other cardiomyopathies: Secondary | ICD-10-CM | POA: Diagnosis present

## 2021-12-01 DIAGNOSIS — Z79899 Other long term (current) drug therapy: Secondary | ICD-10-CM

## 2021-12-01 DIAGNOSIS — E876 Hypokalemia: Secondary | ICD-10-CM | POA: Diagnosis present

## 2021-12-01 DIAGNOSIS — F05 Delirium due to known physiological condition: Secondary | ICD-10-CM | POA: Diagnosis not present

## 2021-12-01 LAB — CBC WITH DIFFERENTIAL/PLATELET
Abs Immature Granulocytes: 0.03 10*3/uL (ref 0.00–0.07)
Basophils Absolute: 0.1 10*3/uL (ref 0.0–0.1)
Basophils Relative: 1 %
Eosinophils Absolute: 0.2 10*3/uL (ref 0.0–0.5)
Eosinophils Relative: 3 %
HCT: 32.3 % — ABNORMAL LOW (ref 39.0–52.0)
Hemoglobin: 9.1 g/dL — ABNORMAL LOW (ref 13.0–17.0)
Immature Granulocytes: 1 %
Lymphocytes Relative: 21 %
Lymphs Abs: 1.4 10*3/uL (ref 0.7–4.0)
MCH: 20.4 pg — ABNORMAL LOW (ref 26.0–34.0)
MCHC: 28.2 g/dL — ABNORMAL LOW (ref 30.0–36.0)
MCV: 72.3 fL — ABNORMAL LOW (ref 80.0–100.0)
Monocytes Absolute: 1.1 10*3/uL — ABNORMAL HIGH (ref 0.1–1.0)
Monocytes Relative: 16 %
Neutro Abs: 3.9 10*3/uL (ref 1.7–7.7)
Neutrophils Relative %: 58 %
Platelets: 453 10*3/uL — ABNORMAL HIGH (ref 150–400)
RBC: 4.47 MIL/uL (ref 4.22–5.81)
RDW: 19.9 % — ABNORMAL HIGH (ref 11.5–15.5)
WBC: 6.6 10*3/uL (ref 4.0–10.5)
nRBC: 0.3 % — ABNORMAL HIGH (ref 0.0–0.2)

## 2021-12-01 LAB — BASIC METABOLIC PANEL
Anion gap: 12 (ref 5–15)
BUN: 30 mg/dL — ABNORMAL HIGH (ref 8–23)
CO2: 30 mmol/L (ref 22–32)
Calcium: 8.8 mg/dL — ABNORMAL LOW (ref 8.9–10.3)
Chloride: 96 mmol/L — ABNORMAL LOW (ref 98–111)
Creatinine, Ser: 1.99 mg/dL — ABNORMAL HIGH (ref 0.61–1.24)
GFR, Estimated: 34 mL/min — ABNORMAL LOW (ref >60)
Glucose, Bld: 117 mg/dL — ABNORMAL HIGH (ref 70–99)
Potassium: 2.1 mmol/L — CL (ref 3.5–5.1)
Sodium: 138 mmol/L (ref 135–145)

## 2021-12-01 LAB — POTASSIUM
Potassium: 2.2 mmol/L — CL (ref 3.5–5.1)
Potassium: 2.7 mmol/L — CL (ref 3.5–5.1)

## 2021-12-01 LAB — MAGNESIUM: Magnesium: 1.4 mg/dL — ABNORMAL LOW (ref 1.7–2.4)

## 2021-12-01 LAB — PROTIME-INR
INR: 3.9 — ABNORMAL HIGH (ref 0.8–1.2)
Prothrombin Time: 38.2 seconds — ABNORMAL HIGH (ref 11.4–15.2)

## 2021-12-01 LAB — TSH: TSH: 0.548 u[IU]/mL (ref 0.350–4.500)

## 2021-12-01 LAB — BRAIN NATRIURETIC PEPTIDE: B Natriuretic Peptide: 278.8 pg/mL — ABNORMAL HIGH (ref 0.0–100.0)

## 2021-12-01 MED ORDER — POTASSIUM CHLORIDE 20 MEQ PO PACK
40.0000 meq | PACK | Freq: Once | ORAL | Status: AC
  Administered 2021-12-01: 40 meq via ORAL
  Filled 2021-12-01: qty 2

## 2021-12-01 MED ORDER — CARVEDILOL 6.25 MG PO TABS
6.2500 mg | ORAL_TABLET | Freq: Two times a day (BID) | ORAL | Status: DC
  Administered 2021-12-02 – 2021-12-05 (×8): 6.25 mg via ORAL
  Filled 2021-12-01 (×8): qty 1

## 2021-12-01 MED ORDER — FUROSEMIDE 10 MG/ML IJ SOLN
40.0000 mg | Freq: Once | INTRAMUSCULAR | Status: AC
  Administered 2021-12-01: 40 mg via INTRAVENOUS
  Filled 2021-12-01: qty 4

## 2021-12-01 MED ORDER — MAGNESIUM SULFATE 4 GM/100ML IV SOLN
4.0000 g | Freq: Once | INTRAVENOUS | Status: AC
  Administered 2021-12-01: 4 g via INTRAVENOUS
  Filled 2021-12-01: qty 100

## 2021-12-01 MED ORDER — POTASSIUM CHLORIDE 10 MEQ/100ML IV SOLN
10.0000 meq | INTRAVENOUS | Status: AC
  Administered 2021-12-01 – 2021-12-02 (×5): 10 meq via INTRAVENOUS
  Filled 2021-12-01 (×5): qty 100

## 2021-12-01 MED ORDER — POTASSIUM CHLORIDE CRYS ER 20 MEQ PO TBCR
40.0000 meq | EXTENDED_RELEASE_TABLET | Freq: Once | ORAL | Status: DC
Start: 2021-12-01 — End: 2021-12-01

## 2021-12-01 MED ORDER — POTASSIUM CHLORIDE CRYS ER 20 MEQ PO TBCR
40.0000 meq | EXTENDED_RELEASE_TABLET | Freq: Once | ORAL | Status: AC
  Administered 2021-12-01: 40 meq via ORAL
  Filled 2021-12-01: qty 2

## 2021-12-01 MED ORDER — ACETAMINOPHEN 325 MG PO TABS: 650.0000 mg | ORAL_TABLET | Freq: Four times a day (QID) | ORAL | Status: DC | PRN

## 2021-12-01 MED ORDER — ACETAMINOPHEN 650 MG RE SUPP: 650.0000 mg | Freq: Four times a day (QID) | RECTAL | Status: DC | PRN

## 2021-12-01 MED ORDER — ATORVASTATIN CALCIUM 10 MG PO TABS
20.0000 mg | ORAL_TABLET | Freq: Every evening | ORAL | Status: DC
  Administered 2021-12-01 – 2021-12-05 (×5): 20 mg via ORAL
  Filled 2021-12-01 (×5): qty 2

## 2021-12-01 MED ORDER — POTASSIUM CHLORIDE 10 MEQ/100ML IV SOLN
10.0000 meq | Freq: Once | INTRAVENOUS | Status: AC
  Administered 2021-12-01: 10 meq via INTRAVENOUS
  Filled 2021-12-01: qty 100

## 2021-12-01 MED ORDER — WARFARIN - PHARMACIST DOSING INPATIENT: Freq: Every day | Status: DC

## 2021-12-01 MED ORDER — POTASSIUM CHLORIDE 20 MEQ PO PACK
40.0000 meq | PACK | Freq: Once | ORAL | Status: AC
Start: 2021-12-01 — End: 2021-12-01
  Administered 2021-12-01: 40 meq via ORAL
  Filled 2021-12-01: qty 2

## 2021-12-01 NOTE — ED Notes (Signed)
Potassium 2.1 reported to Dr. Audley Hose.

## 2021-12-01 NOTE — ED Provider Notes (Signed)
[facility] Provider Note   CSN: [id number] Arrival date & time: 12/01/21  1111     History  Chief Complaint  Patient presents with   [name] is a 76 y.o. male.  Patient presents ER chief complaint of a fall.  States that he was reaching over in his bed last night and he fell out of his bed onto the ground.  He was helped back into bed last night and brought to the ER this morning as they noticed some increased sleepiness today.  Otherwise patient himself has no complaints of pain no headache no chest pain no abdominal pain.  Denies any hip pain or extremity pain.  No reports of fevers or cough or vomiting or diarrhea.      Home Medications Prior to Admission medications   Medication Sig Start Date End Date Taking? Authorizing Provider  acetaminophen (TYLENOL) 500 MG tablet Take 1,000 mg by mouth every 6 (six) hours as needed (pain).    [provider]  atorvastatin (LIPITOR) 20 MG tablet Take 1 tablet (20 mg total) by mouth every evening. 09/16/21   Farrel Gordon, DO  carvedilol (COREG) 6.25 MG tablet Take 1 tablet (6.25 mg total) by mouth 2 (two) times daily with a meal. 12/16/20   Cato Mulligan, MD  diclofenac Sodium (VOLTAREN) 1 % GEL APPLY 4 G TOPICALLY 4 TIMES DAILY 03/12/21   [name], MD  pantoprazole (PROTONIX) 40 MG tablet TAKE 1 TABLET (40 MG TOTAL) BY MOUTH TWICE A DAY BEFORE MEALS 10/15/21   [name], MD  tamsulosin (FLOMAX) 0.4 MG CAPS capsule Take 2 capsules (0.8 mg total) by mouth daily after breakfast. 08/29/21   Demaio, Alexa, MD  torsemide (DEMADEX) 20 MG tablet TAKE 1 TABLET BY MOUTH TWICE A DAY Patient taking differently: Reported MD recently increased dose to 2 tablets (40 mg) twice daily. 09/26/21 12/25/21  [name], MD  traMADol (ULTRAM) 50 MG tablet Take 1 tablet (50 mg total) by mouth every 6 (six) hours as needed. 11/10/21   Valarie Merino, MD  warfarin (COUMADIN) 5 MG tablet Take  warfarin 5 mg tab evening of 4/23 and follow-up in [facility] on Monday with Dr. Elie Confer. 10/26/21   Masters, Katie, DO      Allergies    Patient has no known allergies.    Review of Systems   Review of Systems  Constitutional:  Negative for fever.  HENT:  Negative for ear pain and sore throat.   Eyes:  Negative for pain.  Respiratory:  Negative for cough.   Cardiovascular:  Negative for chest pain.  Gastrointestinal:  Negative for abdominal pain.  Genitourinary:  Negative for flank pain.  Musculoskeletal:  Negative for back pain.  Skin:  Negative for color change and rash.  Neurological:  Negative for syncope.  All other systems reviewed and are negative.  Physical Exam Updated Vital Signs BP 128/85   Pulse 83   Temp 98 F (36.7 C) (Oral)   Resp (!) 22   Ht 5\' 8"  (1.727 m)   Wt 83.9 kg   SpO2 94%   BMI 28.13 kg/m  Physical Exam Constitutional:      Appearance: He is well-developed.  HENT:     Head: Normocephalic.     Nose: Nose normal.  Eyes:     Extraocular Movements: Extraocular movements intact.  Cardiovascular:     Rate and Rhythm: Normal rate.  Pulmonary:  Effort: Pulmonary effort is normal.  Musculoskeletal:     Comments: No pain with range of motion of the bilateral shoulders elbows wrists or hips knees and ankles.  No C or T or L-spine midline step-offs or tenderness noted.  Skin:    Coloration: Skin is not jaundiced.  Neurological:     General: No focal deficit present.     Mental Status: He is alert and oriented to person, place, and time. Mental status is at baseline.    ED Results / Procedures / Treatments   Labs (all labs ordered are listed, but only abnormal results are displayed) Labs Reviewed  CBC WITH DIFFERENTIAL/PLATELET - Abnormal; Notable for the following components:      Result Value   Hemoglobin 9.1 (*)    HCT 32.3 (*)    MCV 72.3 (*)    MCH 20.4 (*)    MCHC 28.2 (*)    RDW 19.9 (*)    Platelets 453 (*)     nRBC 0.3 (*)    Monocytes Absolute 1.1 (*)    All other components within normal limits  BASIC METABOLIC PANEL - Abnormal; Notable for the following components:   Potassium 2.1 (*)    Chloride 96 (*)    Glucose, Bld 117 (*)    BUN 30 (*)    Creatinine, Ser 1.99 (*)    Calcium 8.8 (*)    GFR, Estimated 34 (*)    All other components within normal limits  PROTIME-INR - Abnormal; Notable for the following components:   Prothrombin Time 38.2 (*)    INR 3.9 (*)    All other components within normal limits  POTASSIUM    EKG None  Radiology CT Head Wo Contrast  Result Date: 12/01/2021 CLINICAL DATA:  Head trauma, moderate-severe EXAM: CT HEAD WITHOUT CONTRAST TECHNIQUE: Contiguous axial images were obtained from the base of the skull through the vertex without intravenous contrast. RADIATION DOSE REDUCTION: This exam was performed according to the departmental dose-optimization program which includes automated exposure control, adjustment of the mA and/or kV according to patient size and/or use of iterative reconstruction technique. COMPARISON:  09/15/2021 FINDINGS: Brain: No evidence of acute infarction, hemorrhage, hydrocephalus, extra-axial collection or mass lesion/mass effect. Scattered low-density changes within the periventricular and subcortical white matter compatible with chronic microvascular ischemic change. Mild diffuse cerebral volume loss. Vascular: Bilateral superior ophthalmic veins are dilated and tortuous. No unexpected calcification or hyperdense vessel seen. Skull: Normal. Negative for fracture or focal lesion. Sinuses/Orbits: No acute finding. Other: None. IMPRESSION: 1. No acute intracranial findings. 2. Bilateral superior ophthalmic veins are dilated and tortuous. Findings are nonspecific and can be seen in the setting of idiopathic intracranial hypertension. Electronically Signed   By: Duanne Guess D.O.   On: 12/01/2021 12:30   DG Chest Portable 1 View  Result  Date: 12/01/2021 CLINICAL DATA:  Larey Seat last night. EXAM: PORTABLE CHEST 1 VIEW COMPARISON:  10/25/2021 FINDINGS: Poor inspiration. Interval extensive patchy opacities in both lower lung zones, obscuring the heart borders. No significant change in mild underlying interstitial prominence. No gross change in enlargement of the cardiac silhouette and prosthetic mitral valve. Stable left subclavian bipolar pacemaker leads. Elevated left hemidiaphragm. Thoracic spine degenerative changes. No visible fractures and no pneumothorax. IMPRESSION: 1. Poor inspiration with interval extensive patchy opacities in both lower lung zones. Differential considerations include pneumonia and alveolar edema. 2. Grossly stable cardiomegaly and mild chronic interstitial lung disease. Electronically Signed   By: Zada Finders.D.  On: 12/01/2021 15:36    Procedures .Critical Care Performed by: Luna Fuse, MD Authorized by: Luna Fuse, MD   Critical care provider statement:    Critical care time (minutes):  30   Critical care time was exclusive of:  Separately billable procedures and treating other patients   Critical care was necessary to treat or prevent imminent or life-threatening deterioration of the following conditions:  Metabolic crisis Comments:     Severe hypokalemia requiring IV repletion.     Medications Ordered in ED Medications  potassium chloride SA (KLOR-CON M) CR tablet 40 mEq (40 mEq Oral Given 12/01/21 1407)  potassium chloride 10 mEq in 100 mL IVPB (0 mEq Intravenous Stopped 12/01/21 1543)  potassium chloride (KLOR-CON) packet 40 mEq (40 mEq Oral Given 12/01/21 1554)    ED Course/ Medical Decision Making/ A&P                           Medical Decision Making Amount and/or Complexity of Data Reviewed Labs: ordered. Radiology: ordered.  Risk Prescription drug management.   Cardiac monitoring shows sinus rhythm.  Chart review shows a visit Nov 10, 2021 for knee pain.  Patient is on  Coumadin for history of atrial fibrillation.  Labs today show INR supratherapeutic at 3.9.  Potassium is low at 2.1 however.  Given IV potassium and oral potassium replacement.  Patient given 40 mEq of oral potassium replacement x2 as well as 10 mEq of IV potassium.  Repeat potassium level ordered and pending.          Final Clinical Impression(s) / ED Diagnoses Final diagnoses:  Injury of head, initial encounter  Hypokalemia    Rx / DC Orders ED Discharge Orders     None         Luna Fuse, MD 12/01/21 [phone]

## 2021-12-01 NOTE — ED Triage Notes (Signed)
Pt coming from home via EMS- increase in falls recently. Fell last night and hit his nose. This was a mechanical fall. Pt is on warfarin. EMS notes that he has Afib with multiple PVCs. Pt has been lethargic for EMS. Pt has been having trouble sleeping at night ,then sleeps off and on during the day.  BP 134/6-, HR 47-50, 95% on room air. CBG 192.

## 2021-12-01 NOTE — Hospital Course (Addendum)
[name] is a 76 year old M with a PMH of HFrEF (EF 35 to 40% 10/2021) 2/2 NICM, IE s/p mechanical mitral valve replacement on chronic AC with coumadin, atrial flutter/fibrillation, s/p CRT-P for bradycardia and fatigue, CKD IIIb, HTN, and BPH.    Patient presents after a mechanical fall and increased confusion. Patient states that the night prior to admission he was sitting on a box of clothes trying to reach behind his bed to pick up something when his arm became stuck. He tried multiple times to free his arm but ultimately fell down. He did hit his head but denies any loss of consciousness. He also denies any pain. He states that he is unclear why he was brought to the hospital. Per chart review (as patient's family was unable to be reached by phone), on the morning of the day of admission patient appeared more confused than usual and given his fall and him being on blood thinner, his family decided to bring him in to the hospital.   He denies any headaches, changes in vision, chest pain, dyspnea at rest, abdominal pain, changes in bowel or bladder function. He does note some bendopnea and dyspnea with ambulation but does state he could walk down the hall in the hospital without difficulty. He also notes that he has some lower extremity swelling that continues to be bothersome for him.   Past Medical History:  Diagnosis Date   Acute GI bleeding 06/27/2021   Anemia 01/29/2016   Atrial fibrillation ([place])    post op.  s/p dc-cv 04/2008. 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  a- Echo 07/2008 Ef 55% mild peroprosthetic MVR with High transmitral gradient (mean 14).  b- normal coronaries by cath 12/2007   BPH (benign prostatic hyperplasia)    Cardiac resynchronization therapy pacemaker (CRT-P) in place 06/22/2011   CHF (congestive heart failure)  (HCC)    EF 35-40 % 2011 due to valvular disease and diastolic dysfunction   Chronic back pain    CKD (chronic kidney disease), stage III ([place])    COVID-19 virus infection 07/24/2020   Tested positive on July 16, 2020   Dysphagia    with normal barium swallow 09/2008   Endocarditis    GERD (gastroesophageal reflux disease)    Heavy alcohol use    history   History of atrial flutter 03/13/2008   History of atrial flutter - resolved.    History of GI bleed    Hypertension    IV drug abuse ([place])    history of   Lower GI bleed 01/2016   Memory impairment 12/24/2015   Pacemaker 2010     Who presents to the ED after a mechanical fall and confusion.   Fell last night. No complaints. Sitting on a box of clothes trying reach behind bed to pick up something and fell. No complaints. Patient fell.   Family said drowsy .   Difficult remembering at baseline.   Lives with daughter, 5 grandkis, and significant other   Bendopnea, no dyspnea on exertion, can lie flat without pillows.   Hit head a while ago. Would get dizzy with bending over but this stopped awhile ago.   Still gets dizzy with bending over. NO HA  Went to foot of the bed sat up and hit his  nose and then.   Only gets diazy spells with bending over. Fallen three tiems in the last month.   [id number] [name] childs mother  [id number]

## 2021-12-01 NOTE — ED Notes (Addendum)
Pt tends to desat between 88-90% when resting. Rebounds when asked to take some deep breaths. Pt maintained 97% while standing to use urinal and while eating.  Placed on 2L for support

## 2021-12-01 NOTE — ED Notes (Signed)
Trauma Event Note  Reason for Call : Evaluated patient after being notified by CRN due to fall on thinners. Fall occurred yesterday and unknown if patient hit his head, no level 2 activation but trauma workup to include CT Head. No obvious abrasion/laceration. Per family patient has been falling more lately, trouble staying awake during the day. Takes Warfarin for afib and does have a pacemaker in place on exam. Per EMS HR 40s-50s, EDRN to interrogate pacemaker ([address] [place]).   CT Head ordered and completed with no intracranial abnormality. Labs ordered for further medical workup.   Last imported Vital Signs BP 118/69   Pulse 83   Temp 98 F (36.7 C) (Oral)   Resp 19   Ht 5\' 8"  (1.727 m)   Wt 185 lb (83.9 kg)   SpO2 96%   BMI 28.13 kg/m   Trending CBC Recent Labs    12/01/21 1146  WBC 6.6  HGB 9.1*  HCT 32.3*  PLT 453*   Trending Coag's Recent Labs    12/01/21 1146  INR 3.9*   Trending BMET Recent Labs    12/01/21 1146  NA 138  K 2.1*  CL 96*  CO2 30  BUN 30*  CREATININE 1.99*  GLUCOSE 117*   [name]  Trauma Response RN  Please call TRN at [phone] for further assistance.

## 2021-12-01 NOTE — ED Notes (Signed)
Pt O2 dropped to 83% while sleeping. Pt placed on 2L to support O2 while pt sleeps

## 2021-12-01 NOTE — H&P (Signed)
Date: 12/01/2021               Patient Name:  [name] MRN: [id number]  DOB: [birth year] Age / Sex: 76 y.o., male   PCP: [name], MD         Medical Service: Internal Medicine Teaching Service         Attending Physician: Dr. Campbell Riches, MD    First Contact: Dr. Mitzie Na, MD Pager: [phone]  Second Contact: Dr. Rick Duff, MD  Pager: [phone]       After Hours (After 5p/  First Contact Pager: [phone]  weekends / holidays): Second Contact Pager: [phone]   Chief Complaint: Confusion after fall, Hypokalemia   History of Present Illness:   [name] is a 76 year old M with a PMH of HFrEF (EF 35 to 40% 10/2021) 2/2 NICM, IE s/p mechanical mitral valve replacement on chronic AC with coumadin, atrial flutter/fibrillation, s/p CRT-P for bradycardia and fatigue, CKD IIIb, HTN, and BPH.    Patient presents after a mechanical fall and increased confusion.  Patient states that the night prior to admission he was sitting on a box of clothes trying to reach behind his bed to pick up something when his arm became stuck. He tried multiple times to free his arm but ultimately fell down. He did hit his head but denies any loss of consciousness. He also denies any pain. He states that he is unclear why he was brought to the hospital. Per chart review (as patient's family was unable to be reached by phone), on the morning of the day of admission patient appeared more confused than usual and given his fall and him being on blood thinner, his family decided to bring him in to the hospital.   He notes some bendopnea, dyspnea on ambulation, and lower extremity edema. He has been dealing with these symptoms for some time and his heart failure is primarily managed by the [facility]. He also notes some occasional dizziness with ambulation, but states this is also chronic and when he sits or stands still for a few moments this subsides. He denies any headaches, changes in  vision, chest pain, dyspnea at rest, abdominal pain, changes in bowel or bladder function.   Meds:  Current Meds  Medication Sig   acetaminophen (TYLENOL) 500 MG tablet Take 1,000 mg by mouth every 6 (six) hours as needed (for pain).   atorvastatin (LIPITOR) 20 MG tablet Take 1 tablet (20 mg total) by mouth every evening. (Patient taking differently: Take 20 mg by mouth See admin instructions. Take 20 mg by mouth at 4 PM every day)   carvedilol (COREG) 6.25 MG tablet Take 1 tablet (6.25 mg total) by mouth 2 (two) times daily with a meal.   pantoprazole (PROTONIX) 40 MG tablet TAKE 1 TABLET (40 MG TOTAL) BY MOUTH TWICE A DAY BEFORE MEALS (Patient taking differently: Take 40 mg by mouth 2 (two) times daily before a meal.)   tamsulosin (FLOMAX) 0.4 MG CAPS capsule Take 2 capsules (0.8 mg total) by mouth daily after breakfast.   torsemide (DEMADEX) 20 MG tablet TAKE 1 TABLET BY MOUTH TWICE A DAY (Patient taking differently: Take 20 mg by mouth 2 (two) times daily.)   warfarin (COUMADIN) 5 MG tablet Take warfarin 5 mg tab evening of 4/23 and follow-up in [facility] on Monday with Dr. Elie Confer. (Patient taking differently: Take 2.5-5 mg by mouth See admin instructions. Take 2.5  mg by mouth in the morning on Sun/Sat and 5 mg on Mon/Tues/Wed/Thurs/Fri)     Allergies: Allergies as of 12/01/2021   (No Known Allergies)   Past Medical History:  Diagnosis Date   Acute GI bleeding 06/27/2021   Anemia 01/29/2016   Atrial fibrillation ([place])    post op.  s/p dc-cv 04/2008. 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  a- Echo 07/2008 Ef 55% mild peroprosthetic MVR with High transmitral gradient (mean 14).  b- normal coronaries by cath 12/2007   BPH (benign prostatic hyperplasia)    Cardiac resynchronization therapy pacemaker (CRT-P) in place  06/22/2011   CHF (congestive heart failure) (HCC)    EF 35-40 % 2011 due to valvular disease and diastolic dysfunction   Chronic back pain    CKD (chronic kidney disease), stage III ([place])    COVID-19 virus infection 07/24/2020   Tested positive on July 16, 2020   Dysphagia    with normal barium swallow 09/2008   Endocarditis    GERD (gastroesophageal reflux disease)    Heavy alcohol use    history   History of atrial flutter 03/13/2008   History of atrial flutter - resolved.    History of GI bleed    Hypertension    IV drug abuse ([place])    history of   Lower GI bleed 01/2016   Memory impairment 12/24/2015   Pacemaker 2010    Family History:  Family History  Problem Relation Age of Onset   Coronary artery disease Mother    Cancer Father        GI   Cancer Brother        Lung     Social History:  Lives with the mother of his child, daughter, and grandchildren.  Smoked about 1 ppd for 50 years  Quit drinking alcohol about 10 years ago.  Able to perform all of his adls/iadls   Review of Systems: A complete ROS was negative except as per HPI.   Physical Exam: Blood pressure 136/87, pulse 90, temperature 98 F (36.7 C), temperature source Oral, resp. rate (!) 21, height 5\' 8"  (1.727 m), weight 83.9 kg, SpO2 92 %.   Constitutional: chronically ill appearing, well-nourished, and in no distress.  HENT:  Head: Normocephalic and atraumatic.  Eyes: EOM are normal.  Neck: Normal range of motion.  Cardiovascular: Irregularly irregular, intact distal pulses. No gallop and no friction rub.  No murmur heard. 2+ bilateral lower extremity edema to the hips Pulmonary: Non labored breathing on 2L [place], rales at the bilateral bases  Abdominal: Soft. Normal bowel sounds. Non distended and non tender Musculoskeletal: Normal range of motion.        General: No tenderness or edema.  Neurological: Alert and oriented to person and place. Does not know time, president. Able to say how much  money he would have with a nickel, quarter, and a dime. Non focal  Skin: Skin is warm and dry.    EKG: personally reviewed my interpretation is atrial fibrillation with frequent PVCs  CXR: personally reviewed my interpretation is pulmonary edema, cardiomegaly.   CT head without contrast IMPRESSION: 1. No acute intracranial findings. 2. Bilateral superior ophthalmic veins are dilated and tortuous. Findings are nonspecific and can be seen in the setting of idiopathic intracranial hypertension.  Assessment &  Plan by Problem: Principal Problem:   Hypokalemia  [name] is a 76 year old M with a PMH of HFrEF (EF 35 to 40% 10/2021) 2/2 NICM, IE s/p mechanical mitral valve replacement on chronic AC with coumadin, atrial flutter/fibrillation, s/p CRT-P for bradycardia and fatigue, CKD IIIb, HTN, and BPH. He was initially brought to the ED after his family noted that he was more confused after a mechanical fall.  He appeared to be back at baseline on evaluation but was found to have hypokalemia to 2.1 and an oxygen requirement after receiving intravenous fluid for potassium repletion.  #Mechanical fall Patient experienced a mechanical fall in the setting of chronic anticoagulation. He denies any pain.  CT head without contrast showed no evidence of bleeding. -Will check orthostatic vital signs, vitamin B12 and folate -PT/OT   #Acute on chronic systolic and diastolic heart failure  Patient noted to have persistent lower extremity edema at home despite increased doses of his torsemide in the outpatient setting though because of his cognitive impairment may be prohibiting his medication adherence.  He also noted some dyspnea. After patient received some IV runs of potassium he became more dyspneic and was noted to have rales. He received IV lasix.  -Continue IV diuresis  -Wean O2 as able  -Strict I/Os -Daily weights   #Hypokalemia  Patient with severe hypokalemia. Likely 2/2 loop  diuretic use. He is currently receiving IV and PO potassium repletion.  -Replete magnesium   #MR s/p MVR on coumadin  INR supratherapeutic on admission to 3.9. Consulted pharmacy.   #Atrial fibrillation  Afib on admission. On coumadin and coreg at home.   #Microcytic anemia  Will check iron panel and replete   CKD3b BL ~1.7-1.8. 1.9 on admission  HTN BPH  Full code  Dispo: Admit patient to Observation with expected length of stay less than 2 midnights.  Signed: Rick Duff, MD 12/01/2021, 6:35 PM  After 5pm on weekdays and 1pm on weekends: On Call pager: [phone]

## 2021-12-01 NOTE — Progress Notes (Signed)
ANTICOAGULATION CONSULT NOTE - Initial Consult  Pharmacy Consult for Warfarin Indication:  H/o mitral valve replacement  No Known Allergies  Patient Measurements: Height: 5\' 8"  (172.7 cm) Weight: 83.9 kg (185 lb) IBW/kg (Calculated) : 68.4  Vital Signs: Temp: 98 F (36.7 C) (05/29 1121) Temp Source: Oral (05/29 1121) BP: 136/87 (05/29 1700) Pulse Rate: 90 (05/29 1700)  Labs: Recent Labs    12/01/21 1146  HGB 9.1*  HCT 32.3*  PLT 453*  LABPROT 38.2*  INR 3.9*  CREATININE 1.99*    Estimated Creatinine Clearance: 32.3 mL/min (A) (by C-G formula based on SCr of 1.99 mg/dL (H)).   Medical History: Past Medical History:  Diagnosis Date   Acute GI bleeding 06/27/2021   Anemia 01/29/2016   Atrial fibrillation (Icard)    post op.  s/p dc-cv 04/2008. 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  a- Echo 07/2008 Ef 55% mild peroprosthetic MVR with High transmitral gradient (mean 14).  b- normal coronaries by cath 12/2007   BPH (benign prostatic hyperplasia)    Cardiac resynchronization therapy pacemaker (CRT-P) in place 06/22/2011   CHF (congestive heart failure) (HCC)    EF 35-40 % 2011 due to valvular disease and diastolic dysfunction   Chronic back pain    CKD (chronic kidney disease), stage III ([place])    COVID-19 virus infection 07/24/2020   Tested positive on July 16, 2020   Dysphagia    with normal barium swallow 09/2008   Endocarditis    GERD (gastroesophageal reflux disease)    Heavy alcohol use    history   History of atrial flutter 03/13/2008   History of atrial flutter - resolved.    History of GI bleed    Hypertension    IV drug abuse ([place])    history of   Lower GI bleed 01/2016   Memory impairment 12/24/2015   Pacemaker 2010   Medications:  (Not in a hospital admission)  Scheduled:  Infusions:   potassium  chloride     Assessment: 46 yom with a history of anemia, endocarditis s/p MVR, GIB presenting with fall. Warfarin per pharmacy consult placed for h/o mitral valve replacement with mechanical valve.  Home dose of warfarin is 2.5 mg (5 mg x 0.5) every Sun, Sat; 5 mg (5 mg x 1) all other days per last anti-coag visit (5/22). Last dose of warfarin was 5/29 AM per patient and friend that helps with medications.  PT/INR: 38.2/3.9 Hgb 9.1; plt 453 - baseline  Goal of Therapy:  INR 2.5-3.5 Monitor platelets by anticoagulation protocol: Yes   Plan:  No more warfarin today - subsequent dosing per INR Monitor daily labs and INR  Lorelei Pont, PharmD, BCPS 12/01/2021 5:39 PM ED Clinical Pharmacist -  [phone]

## 2021-12-01 NOTE — ED Notes (Signed)
Pt attempted to crawl off the end of the stretcher. Staff immediately went to room to help pt back into bed. Pt assisted in using the urinal.

## 2021-12-01 NOTE — ED Notes (Signed)
Pt transported to CT ?

## 2021-12-02 DIAGNOSIS — D509 Iron deficiency anemia, unspecified: Secondary | ICD-10-CM | POA: Diagnosis present

## 2021-12-02 DIAGNOSIS — I4821 Permanent atrial fibrillation: Secondary | ICD-10-CM | POA: Diagnosis present

## 2021-12-02 DIAGNOSIS — M549 Dorsalgia, unspecified: Secondary | ICD-10-CM | POA: Diagnosis present

## 2021-12-02 DIAGNOSIS — I130 Hypertensive heart and chronic kidney disease with heart failure and stage 1 through stage 4 chronic kidney disease, or unspecified chronic kidney disease: Secondary | ICD-10-CM | POA: Diagnosis present

## 2021-12-02 DIAGNOSIS — N400 Enlarged prostate without lower urinary tract symptoms: Secondary | ICD-10-CM | POA: Diagnosis present

## 2021-12-02 DIAGNOSIS — W19XXXA Unspecified fall, initial encounter: Secondary | ICD-10-CM

## 2021-12-02 DIAGNOSIS — G9341 Metabolic encephalopathy: Secondary | ICD-10-CM | POA: Diagnosis present

## 2021-12-02 DIAGNOSIS — I4430 Unspecified atrioventricular block: Secondary | ICD-10-CM | POA: Diagnosis present

## 2021-12-02 DIAGNOSIS — R0902 Hypoxemia: Secondary | ICD-10-CM | POA: Diagnosis present

## 2021-12-02 DIAGNOSIS — Z7901 Long term (current) use of anticoagulants: Secondary | ICD-10-CM | POA: Diagnosis not present

## 2021-12-02 DIAGNOSIS — E876 Hypokalemia: Secondary | ICD-10-CM | POA: Diagnosis present

## 2021-12-02 DIAGNOSIS — F05 Delirium due to known physiological condition: Secondary | ICD-10-CM

## 2021-12-02 DIAGNOSIS — Y92003 Bedroom of unspecified non-institutional (private) residence as the place of occurrence of the external cause: Secondary | ICD-10-CM | POA: Diagnosis not present

## 2021-12-02 DIAGNOSIS — N1832 Chronic kidney disease, stage 3b: Secondary | ICD-10-CM

## 2021-12-02 DIAGNOSIS — K219 Gastro-esophageal reflux disease without esophagitis: Secondary | ICD-10-CM | POA: Diagnosis present

## 2021-12-02 DIAGNOSIS — I5043 Acute on chronic combined systolic (congestive) and diastolic (congestive) heart failure: Secondary | ICD-10-CM

## 2021-12-02 DIAGNOSIS — G8929 Other chronic pain: Secondary | ICD-10-CM | POA: Diagnosis present

## 2021-12-02 DIAGNOSIS — I428 Other cardiomyopathies: Secondary | ICD-10-CM | POA: Diagnosis present

## 2021-12-02 DIAGNOSIS — Z79899 Other long term (current) drug therapy: Secondary | ICD-10-CM | POA: Diagnosis not present

## 2021-12-02 DIAGNOSIS — W06XXXA Fall from bed, initial encounter: Secondary | ICD-10-CM | POA: Diagnosis present

## 2021-12-02 DIAGNOSIS — F17210 Nicotine dependence, cigarettes, uncomplicated: Secondary | ICD-10-CM | POA: Diagnosis present

## 2021-12-02 DIAGNOSIS — W19XXXD Unspecified fall, subsequent encounter: Secondary | ICD-10-CM

## 2021-12-02 DIAGNOSIS — Z8616 Personal history of COVID-19: Secondary | ICD-10-CM | POA: Diagnosis not present

## 2021-12-02 DIAGNOSIS — Z952 Presence of prosthetic heart valve: Secondary | ICD-10-CM | POA: Diagnosis not present

## 2021-12-02 DIAGNOSIS — I4892 Unspecified atrial flutter: Secondary | ICD-10-CM | POA: Diagnosis present

## 2021-12-02 LAB — VITAMIN B12: Vitamin B-12: 433 pg/mL (ref 180–914)

## 2021-12-02 LAB — IRON AND TIBC
Iron: 19 ug/dL — ABNORMAL LOW (ref 45–182)
Saturation Ratios: 5 % — ABNORMAL LOW (ref 17.9–39.5)
TIBC: 405 ug/dL (ref 250–450)
UIBC: 386 ug/dL

## 2021-12-02 LAB — BLOOD GAS, ARTERIAL
Acid-Base Excess: 7.4 mmol/L — ABNORMAL HIGH (ref 0.0–2.0)
Bicarbonate: 29.3 mmol/L — ABNORMAL HIGH (ref 20.0–28.0)
Drawn by: 42783
O2 Saturation: 99.6 %
Patient temperature: 37
pCO2 arterial: 32 mmHg (ref 32–48)
pH, Arterial: 7.57 — ABNORMAL HIGH (ref 7.35–7.45)
pO2, Arterial: 130 mmHg — ABNORMAL HIGH (ref 83–108)

## 2021-12-02 LAB — BASIC METABOLIC PANEL
Anion gap: 12 (ref 5–15)
BUN: 27 mg/dL — ABNORMAL HIGH (ref 8–23)
CO2: 30 mmol/L (ref 22–32)
Calcium: 8.9 mg/dL (ref 8.9–10.3)
Chloride: 99 mmol/L (ref 98–111)
Creatinine, Ser: 1.86 mg/dL — ABNORMAL HIGH (ref 0.61–1.24)
GFR, Estimated: 37 mL/min — ABNORMAL LOW (ref >60)
Glucose, Bld: 123 mg/dL — ABNORMAL HIGH (ref 70–99)
Potassium: 2.8 mmol/L — ABNORMAL LOW (ref 3.5–5.1)
Sodium: 141 mmol/L (ref 135–145)

## 2021-12-02 LAB — CBC
HCT: 35.2 % — ABNORMAL LOW (ref 39.0–52.0)
Hemoglobin: 9.8 g/dL — ABNORMAL LOW (ref 13.0–17.0)
MCH: 20.2 pg — ABNORMAL LOW (ref 26.0–34.0)
MCHC: 27.8 g/dL — ABNORMAL LOW (ref 30.0–36.0)
MCV: 72.4 fL — ABNORMAL LOW (ref 80.0–100.0)
Platelets: 493 10*3/uL — ABNORMAL HIGH (ref 150–400)
RBC: 4.86 MIL/uL (ref 4.22–5.81)
RDW: 20.1 % — ABNORMAL HIGH (ref 11.5–15.5)
WBC: 5.8 10*3/uL (ref 4.0–10.5)
nRBC: 0.5 % — ABNORMAL HIGH (ref 0.0–0.2)

## 2021-12-02 LAB — MAGNESIUM: Magnesium: 2.5 mg/dL — ABNORMAL HIGH (ref 1.7–2.4)

## 2021-12-02 LAB — FOLATE: Folate: 20.2 ng/mL (ref >5.9)

## 2021-12-02 LAB — PROTIME-INR
INR: 3.5 — ABNORMAL HIGH (ref 0.8–1.2)
Prothrombin Time: 34.6 seconds — ABNORMAL HIGH (ref 11.4–15.2)

## 2021-12-02 LAB — POTASSIUM
Potassium: 3.2 mmol/L — ABNORMAL LOW (ref 3.5–5.1)
Potassium: 3.7 mmol/L (ref 3.5–5.1)

## 2021-12-02 LAB — FERRITIN: Ferritin: 18 ng/mL — ABNORMAL LOW (ref 24–336)

## 2021-12-02 MED ORDER — GUAIFENESIN-DM 100-10 MG/5ML PO SYRP
5.0000 mL | ORAL_SOLUTION | ORAL | Status: DC | PRN
  Administered 2021-12-02 – 2021-12-05 (×9): 5 mL via ORAL
  Filled 2021-12-02 (×9): qty 5

## 2021-12-02 MED ORDER — FUROSEMIDE 10 MG/ML IJ SOLN
40.0000 mg | Freq: Once | INTRAMUSCULAR | Status: AC
  Administered 2021-12-02: 40 mg via INTRAVENOUS
  Filled 2021-12-02: qty 4

## 2021-12-02 MED ORDER — QUETIAPINE FUMARATE 50 MG PO TABS
25.0000 mg | ORAL_TABLET | Freq: Once | ORAL | Status: AC | PRN
  Administered 2021-12-02: 25 mg via ORAL
  Filled 2021-12-02: qty 1

## 2021-12-02 MED ORDER — POTASSIUM CHLORIDE CRYS ER 20 MEQ PO TBCR
40.0000 meq | EXTENDED_RELEASE_TABLET | Freq: Two times a day (BID) | ORAL | Status: DC
Start: 2021-12-02 — End: 2021-12-02
  Administered 2021-12-02: 40 meq via ORAL
  Filled 2021-12-02: qty 2

## 2021-12-02 MED ORDER — SODIUM CHLORIDE 0.9 % IV SOLN
250.0000 mg | Freq: Every day | INTRAVENOUS | Status: DC
  Filled 2021-12-02 (×2): qty 20

## 2021-12-02 MED ORDER — POTASSIUM CHLORIDE CRYS ER 20 MEQ PO TBCR
40.0000 meq | EXTENDED_RELEASE_TABLET | Freq: Two times a day (BID) | ORAL | Status: AC
Start: 2021-12-02 — End: 2021-12-02
  Administered 2021-12-02 (×2): 40 meq via ORAL
  Filled 2021-12-02 (×2): qty 2

## 2021-12-02 MED ORDER — POTASSIUM CHLORIDE 10 MEQ/100ML IV SOLN
10.0000 meq | INTRAVENOUS | Status: AC
  Administered 2021-12-02 (×4): 10 meq via INTRAVENOUS
  Filled 2021-12-02 (×4): qty 100

## 2021-12-02 MED ORDER — WARFARIN SODIUM 2.5 MG PO TABS
2.5000 mg | ORAL_TABLET | Freq: Once | ORAL | Status: AC
  Administered 2021-12-02: 2.5 mg via ORAL
  Filled 2021-12-02: qty 1

## 2021-12-02 MED ORDER — POTASSIUM CHLORIDE 10 MEQ/100ML IV SOLN
10.0000 meq | INTRAVENOUS | Status: AC
  Administered 2021-12-02 (×2): 10 meq via INTRAVENOUS
  Filled 2021-12-02 (×2): qty 100

## 2021-12-02 NOTE — Evaluation (Signed)
Physical Therapy Evaluation Patient Details Name: [name] MRN: [id number] DOB: April 09, 1946 Today's Date: 12/02/2021  History of Present Illness  Pt is a 76 y/o male who presented with AMS, hypokalemia and s/p mechanical fall. CT negative for acute fx. PMH: CHF, mitral valve replacement, a fib,  s/p CRT-P for bradycardia, CKD, HTN, BPH.  Clinical Impression  Prior to admission, patient was independent/modI for all ADLs/IADLs using a cane for ambulation. Pt is currrently supervision for transfers and min guard for ambulation of 323ft with RW. Pt was on 2L O2 and stats were 100% at rest, after ambulation on RA pt stats were at 96%, left on RA, RN notified. Pt BP was taken before and after session as well as monitored for signs of dizziness or lightheadedness. BP was 110/73 at the start and 128/89 at the end of the session and pt reported no signs of dizziness. Pt demonstrates mild cognitive deficits and needs cueing and reminders to orient him to the situation. Pt would benefit from acute PT to address balance and mobility deficits as cognition improves. Anticipating pt will not require additional physical therapy upon discharge.    Recommendations for follow up therapy are one component of a multi-disciplinary discharge planning process, led by the attending physician.  Recommendations may be updated based on patient status, additional functional criteria and insurance authorization.  Follow Up Recommendations No PT follow up    Assistance Recommended at Discharge PRN  Patient can return home with the following  Assistance with cooking/housework;Direct supervision/assist for medications management;Direct supervision/assist for financial management;Assist for transportation;Help with stairs or ramp for entrance    Equipment Recommendations None recommended by PT  Recommendations for Other Services       Functional Status Assessment Patient has had a recent decline in their functional  status and demonstrates the ability to make significant improvements in function in a reasonable and predictable amount of time.     Precautions / Restrictions Precautions Precautions: Fall Restrictions Weight Bearing Restrictions: No      Mobility  Bed Mobility               General bed mobility comments: Pt sitting EOB upon arrival    Transfers Overall transfer level: Needs assistance Equipment used: Rolling walker (2 wheels) Transfers: Sit to/from Stand Sit to Stand: Supervision           General transfer comment: stood from EOB, sat to recliner, no assist required, no LOB, supervision for safety    Ambulation/Gait Ambulation/Gait assistance: Min guard Gait Distance (Feet): 300 Feet Assistive device: IV Pole, Rolling walker (2 wheels) Gait Pattern/deviations: Trunk flexed, Decreased stride length Gait velocity: decreased Gait velocity interpretation: 1.31 - 2.62 ft/sec, indicative of limited community ambulator   General Gait Details: Pt requested to attempt to get to the bathroom without AD, minor LOB noted, therapist suggested using IV pole and pt able to ambulate to hallway with no LOB min guard. Pt requesting to use RW due to increased fatigue, able to ambulate the rest of the way with RW and min guard. Cues for upright posture and keeping RW close  Stairs            Wheelchair Mobility    Modified Rankin (Stroke Patients Only)       Balance Overall balance assessment: Mild deficits observed, not formally tested  Pertinent Vitals/Pain Pain Assessment Pain Assessment: No/denies pain    Home Living Family/patient expects to be discharged to:: Private residence Living Arrangements: Spouse/significant other;Children;Other relatives Available Help at Discharge: Family;Available 24 hours/day Type of Home: House Home Access: Stairs to enter Entrance Stairs-Rails: Right Entrance  Stairs-Number of Steps: 5   Home Layout: One level Home Equipment: Cane - single [employer]ista (2 wheels) Additional Comments: Pt states he has a RW at home but is a poor historian due to cognitive deficts    Prior Function Prior Level of Function : Independent/Modified Independent             Mobility Comments: Uses cane for ambulation       Hand Dominance        Extremity/Trunk Assessment   Upper Extremity Assessment Upper Extremity Assessment: Overall WFL for tasks assessed    Lower Extremity Assessment Lower Extremity Assessment: Overall WFL for tasks assessed    Cervical / Trunk Assessment Cervical / Trunk Assessment: Kyphotic  Communication   Communication: No difficulties  Cognition Arousal/Alertness: Awake/alert Behavior During Therapy: WFL for tasks assessed/performed, Flat affect Overall Cognitive Status: Impaired/Different from baseline Area of Impairment: Orientation, Awareness, Problem solving                 Orientation Level: Disoriented to, Place, Time, Situation       Safety/Judgement: Decreased awareness of deficits, Decreased awareness of safety Awareness: Intellectual Problem Solving: Slow processing, Requires verbal cues General Comments: repeatedly asking where he is, unable to complete dual cog task of stating months in reverse order during ambulation        General Comments      Exercises     Assessment/Plan    PT Assessment Patient needs continued PT services  PT Problem List Decreased activity tolerance;Decreased balance;Decreased cognition       PT Treatment Interventions DME instruction;Gait training;Stair training;Therapeutic activities;Therapeutic exercise;Balance training;Functional mobility training;Cognitive remediation;Patient/family education    PT Goals (Current goals can be found in the Care Plan section)  Acute Rehab PT Goals Patient Stated Goal: to return home PT Goal Formulation: With  patient Time For Goal Achievement: 12/16/21 Potential to Achieve Goals: Good    Frequency Min 3X/week     Co-evaluation               AM-PAC PT "6 Clicks" Mobility  Outcome Measure Help needed turning from your back to your side while in a flat bed without using bedrails?: None Help needed moving from lying on your back to sitting on the side of a flat bed without using bedrails?: None Help needed moving to and from a bed to a chair (including a wheelchair)?: A Little Help needed standing up from a chair using your arms (e.g., wheelchair or bedside chair)?: A Little Help needed to walk in hospital room?: A Little Help needed climbing 3-5 steps with a railing? : A Little 6 Click Score: 20    End of Session Equipment Utilized During Treatment: Gait belt Activity Tolerance: Patient tolerated treatment well Patient left: in chair;with call bell/phone within reach;with chair alarm set Nurse Communication: Mobility status PT Visit Diagnosis: Unsteadiness on feet (R26.81);Difficulty in walking, not elsewhere classified (R26.2)    Time: [id number] PT Time Calculation (min) (ACUTE ONLY): 26 min   Charges:   PT Evaluation $PT Eval Moderate Complexity: 1 Mod PT Treatments $Gait Training: 8-22 mins      Mackie Pai, SPT Acute Rehabilitation Services  Office: [phone]   Mackie Pai 12/02/2021, 2:14  PM

## 2021-12-02 NOTE — Progress Notes (Signed)
Mobility Specialist Progress Note:   12/02/21 1500  Mobility  Activity Ambulated with assistance in hallway  Level of Assistance Contact guard assist, steadying assist  Assistive Device Front wheel walker  Distance Ambulated (ft) 275 ft  Activity Response Tolerated well  $Mobility charge 1 Mobility   Pt eager for mobility session. No c/o pre-ambulation, c/o dizziness at ~150ft. Recovered with standing rest break. Pt back in bed with all needs met, bed alarm on.     Acute Rehab Secure Chat or Office Phone: [phone]  

## 2021-12-02 NOTE — Progress Notes (Signed)
Pt is agitated. Pulled iv and tele off. Refusing iv and tele back on. Sob on exertion refusing to sit down. Yelling and pushing away if I get close. Wants to go home  tried calling number on file to speak with pt and no one answering. Notified on call through page and awaiting response or orders.   Pt cont to be agitated and not following commands. Sob on exertion and wobbly at best. Pt is high fall risk and not following commands with sitter. Pt has been up and down all night with no relief., cont to be sob but will not keep o2 on. Yells and swings his arms if you get to close bc he is wobbly on feet. On call has been notified several times tonight related to confusion and agitation. New orders placed and followed with no relief.

## 2021-12-02 NOTE — Progress Notes (Signed)
Subjective:  Patient seen and assessed at bedside. Very somnolent this am though appears this is his baseline. Reports worsening dyspnea but no other acute complaints at this time. Denies any pain today. Feels like he has a lot of swelling in his legs.   Objective:  Vital signs in last 24 hours: Vitals:   12/01/21 2006 12/02/21 0008 12/02/21 0427 12/02/21 0430  BP: 133/73 138/74 (!) 142/97   Pulse: 97 94 (!) 101   Resp: 18 18    Temp: 98.6 F (37 C) 98 F (36.7 C) 98.7 F (37.1 C)   TempSrc: Oral Oral Oral   SpO2: 93% 98% 92%   Weight:    88.8 kg  Height:       General: NAD, chronically ill-appearing HE: Normocephalic, atraumatic, EOMI, Conjunctivae normal ENT: No congestion, no rhinorrhea, no exudate or erythema  Cardiovascular: Irregularly irregular,  No murmurs, rubs, or gallops. 1-2+ bilateral pitting edema in BLE Pulmonary: Mildly increased WOB, On 2L [place] though prongs noted to be hanging outside of nares. No wheezes, rales, or rhonchi. Rales at bilateral lower lung bases. Abdominal: soft, nontender, bowel sounds present Musculoskeletal: no deformity, injury or tenderness in extremities. Skin: Warm, dry, no bruising, erythema, or rash Psychiatric/Behavioral: normal mood, normal behavior     Assessment/Plan:  Principal Problem:   Hypokalemia  [name] is a 76 year old M with a PMH of HFrEF (EF 35 to 40% 10/2021) 2/2 NICM, IE s/p mechanical mitral valve replacement on chronic AC with coumadin, atrial flutter/fibrillation, s/p CRT-P for bradycardia and fatigue, CKD IIIb, HTN, and BPH. Admitted for hypokalemia and acute on chronic systolic and diastolic HF with a new O2 requirement.  #Acute on chronic systolic and diastolic heart failure  Patient noted to have persistent lower extremity edema at home despite increased doses of his torsemide in the outpatient setting though because of his cognitive impairment may be prohibiting his medication adherence. Received IV  Lasix 40 mg x 1 with output of 1.9 L + one unmeasured void in the past 24 hours. Noted to have mildly increased WOB today though [place] prongs were noted to be outside of the nares; readjusted his [place] in the room, currently on 2L. Also continues to appear volume overloaded on exam.  -IV Lasix 40 mg x 1 -ABG pending -Wean O2 as able  -Strict I/Os -Daily weights    #Hypokalemia, improving Patient with severe hypokalemia likely 2/2 loop diuretic use. He is currently receiving IV and PO potassium repletion. Magnesium is WNL today. -Trend BMP, Magnesium -Replete for K < 4, Mag < 2  #Mechanical fall Patient experienced a mechanical fall in the setting of chronic anticoagulation. He denies any pain. CT head without contrast showed no evidence of bleeding. Orthostatics are WNL. -Will check vitamin B12 and folate -PT/OT eval pending   #MR s/p MVR on coumadin  INR supratherapeutic on admission to 3.9.  -Appreciate pharmacy's assistance   #Atrial fibrillation  Afib on admission. -Continue home coumadin and coreg   #Microcytic anemia  Will check iron panel and replete    CKD3b BL ~1.7-1.8. 1.9 on admission Stable.  -Trend BMP -Avoid nephrotoxic medications  HTN Systolics are mildly hypertensive, ranging in the 130s-140s -Continue home medication regimen  Prior to Admission Living Arrangement: Home Anticipated Discharge Location: Pending Barriers to Discharge: Medical stability and PT/OT evals Dispo: Anticipated discharge pending medical stability, PT/OT eval  Orvis Brill, MD 12/02/2021, 6:49 AM Pager: [phone]  After 5pm on weekdays and 1pm  on weekends: On Call pager [phone]

## 2021-12-02 NOTE — Progress Notes (Addendum)
Clinical update:  Received secure chat from RN around 2000 that the patient pulled out his IV and is agitated. The patient was refusing to sit down for nursing staff and is short of breath on exertion and is also unsteady on his feet. The patient is adamant that he wants to go home, as he believes he is not getting better here.   Evaluated the patient at the bedside with Dr. Mcarthur Rossetti at 2040 and the patient appears to be confused. He states that he wants to leave the hospital so he can get better. He is also concerned that there is someone watching him here and that is prohibiting him from getting better. The patient also does not remember pulling out his own IV. Throughout our conversation, the patient would intermittently forget that he was in the hospital. He spoke with his daughter on the phone and requested that she come pick him up, however, she requested that he stay in the hospital.   I suspect that the patient has hospital delirium. He is redirectable at this time and we will not give any meds, however, delirium precautions are now in place. If patient remains agitated and is not redirectable, could consider low dose of seroquel. The patient also agreed to have a new IV placed and IV team came to the bedside to assist with this.    [name], DO Internal Medicine Resident, PGY-1 On-call pager: [phone]    Addendum: Re-evaluated the patient at the bedside with his daughter present. The patient remains agitated and he continues to state that he wants to go home with his daughter. Gave one time dose of seroquel 25 mg for anxiety and to try to help the patient sleep.

## 2021-12-02 NOTE — Progress Notes (Signed)
Mobility Specialist Progress Note:   12/02/21 1400  Mobility  Activity Ambulated with assistance to bathroom  Level of Assistance Standby assist, set-up cues, supervision of patient - no hands on  Assistive Device None  Distance Ambulated (ft) 15 ft  Activity Response Tolerated well  $Mobility charge 1 Mobility   Responded to bed alarm, pt found standing at beside requesting to go to BR. Ambulated to BR with no assistance. Will f/u for hopeful ambulation.   Addison Lank Acute Rehab Secure Chat or Office Phone: [phone]

## 2021-12-02 NOTE — Evaluation (Signed)
Speech Language Pathology Evaluation Patient Details Name: [name] MRN: [id number] DOB: 1946/05/02 Today's Date: 12/02/2021 Time: 1525-1550 SLP Time Calculation (min) (ACUTE ONLY): 25 min  Problem List:  Patient Active Problem List   Diagnosis Date Noted   Accident due to mechanical fall without injury    Hypokalemia 12/01/2021   Right knee pain 11/10/2021   Sleep disturbances 11/05/2021   Atrial fibrillation ([place]) 10/24/2021   Acute exacerbation of CHF (congestive heart failure) ([place]) 10/24/2021   Pre-diabetes 09/23/2021   TIA (transient ischemic attack) 09/15/2021   GI bleed 06/27/2021   Inguinal hernia, right 04/21/2021   Lower leg pain 02/12/2021   Stage 3b chronic kidney disease (CKD) ([place]) 03/26/2016   Dementia ([place]) 12/24/2015   H/O mitral valve replacement with mechanical valve 11/13/2015   Benign prostatic hyperplasia with nocturia 11/13/2015   Healthcare maintenance 11/13/2015   History of arteriovenous malformation (AVM) 03/28/2014   Chronic biventricular, systolic heart failure ([place]) 06/02/2011   Essential hypertension 09/09/2008   History of atrial flutter 03/13/2008   Past Medical History:  Past Medical History:  Diagnosis Date   Acute GI bleeding 06/27/2021   Anemia 01/29/2016   Atrial fibrillation ([place])    post op.  s/p dc-cv 04/2008. 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  a- Echo 07/2008 Ef 55% mild peroprosthetic MVR with High transmitral gradient (mean 14).  b- normal coronaries by cath 12/2007   BPH (benign prostatic hyperplasia)    Cardiac resynchronization therapy pacemaker (CRT-P) in place 06/22/2011   CHF (congestive heart failure) (HCC)    EF 35-40 % 2011 due to valvular disease and diastolic dysfunction   Chronic back pain    CKD (chronic kidney disease), stage III ([place])    COVID-19 virus  infection 07/24/2020   Tested positive on July 16, 2020   Dysphagia    with normal barium swallow 09/2008   Endocarditis    GERD (gastroesophageal reflux disease)    Heavy alcohol use    history   History of atrial flutter 03/13/2008   History of atrial flutter - resolved.    History of GI bleed    Hypertension    IV drug abuse ([place])    history of   Lower GI bleed 01/2016   Memory impairment 12/24/2015   Pacemaker 2010   Past Surgical History:  Past Surgical History:  Procedure Laterality Date   BIOPSY  06/29/2021   Procedure: BIOPSY;  Surgeon: Rush Landmark Telford Nab., MD;  Location: [facility] ENDOSCOPY;  Service: Gastroenterology;;   [place] N/A 12/13/2017   Procedure: BIV PACEMAKER GENERATOR CHANGEOUT;  Surgeon: [name], MD;  Location: [facility] LAB;  Service: Cardiovascular;  Laterality: N/A;   CARDIAC VALVE REPLACEMENT     mitral valve, st jude mechanical    COLONOSCOPY N/A 03/26/2014   Procedure: COLONOSCOPY;  Surgeon: Milus Banister, MD;  Location: [place];  Service: Endoscopy;  Laterality: N/A;   ENTEROSCOPY N/A 03/28/2014   Procedure: ENTEROSCOPY;  Surgeon: Milus Banister, MD;  Location: [place];  Service: Endoscopy;  Laterality: N/A;   ENTEROSCOPY N/A 06/29/2021   Procedure: ENTEROSCOPY;  Surgeon: Rush Landmark Telford Nab., MD;  Location: [place];  Service: Gastroenterology;  Laterality: N/A;   ESOPHAGOGASTRODUODENOSCOPY N/A 03/25/2014   Procedure: ESOPHAGOGASTRODUODENOSCOPY (EGD);  Surgeon: Gatha Mayer,  MD;  Location: [place] ENDOSCOPY;  Service: Endoscopy;  Laterality: N/A;   GIVENS CAPSULE STUDY N/A 03/26/2014   Procedure: GIVENS CAPSULE STUDY;  Surgeon: Milus Banister, MD;  Location: [place];  Service: Endoscopy;  Laterality: N/A;   HOT HEMOSTASIS N/A 06/29/2021   Procedure: HOT HEMOSTASIS (ARGON PLASMA COAGULATION/BICAP);  Surgeon: Irving Copas., MD;  Location: [place];  Service: Gastroenterology;   Laterality: N/A;   PACEMAKER INSERTION     inserted about 4-5 years ago   [place]  06/29/2021   Procedure: SUBMUCOSAL TATTOO INJECTION;  Surgeon: Irving Copas., MD;  Location: [facility] ENDOSCOPY;  Service: Gastroenterology;;   HPI:  Patient is a 76 y.o. male with PMH: CHF, mitral valve replacement, a fib,  s/p CRT-P for bradycardia, CKD, HTN, BPH. He was brought by family to the ED after mechanical fall at home and increased confusion. CT head was negative for acute intracranial abnormality.   Assessment / Plan / Recommendation Clinical Impression  Patient currently presenting with cognitive functioning that is moderately impaired. SLP unable to to determine if patient is at or near baseline but per RN who spoke with a family member, he has had h/o cognitive decline. SLP administered the SLUMS for which patient received a score of 13 out of possible 30 which is significantly below normal range of 27-30 and places him in category of 'Dementia' (scores 1-20). Patient was not oriented to place, time or situation and was perseverative on asking when IV's could come out. During test administration portion of evaluation patient was calm and cooperative but during informal discussion afterwards, patient told SLP, "this is serious: Am I at the doctor's office, Where am I?" He then asked why he was here and started to become fixated on wanting IV's to be finished and for him to go home. He then became increasingly confused, perseverative and restless. At this time, patient is not safe to be without 24 hour supervision secondary to his confusion and poor memory, insight/reasoning, awareness, problem solving, safety awareness. SLP is recommending f/u [place] SLP to work with patient and family on maximizing safety and reducing caregiver burden.    SLP Assessment  SLP Recommendation/Assessment: All further Speech Lanaguage Pathology  needs can be addressed in the next venue of care SLP Visit  Diagnosis: Cognitive communication deficit (R41.841)    Recommendations for follow up therapy are one component of a multi-disciplinary discharge planning process, led by the attending physician.  Recommendations may be updated based on patient status, additional functional criteria and insurance authorization.    Follow Up Recommendations  Home health SLP    Assistance Recommended at Discharge  Frequent or constant Supervision/Assistance  Functional Status Assessment Patient has had a recent decline in their functional status and demonstrates the ability to make significant improvements in function in a reasonable and predictable amount of time.  Frequency and Duration   N/A        SLP Evaluation Cognition  Overall Cognitive Status: No family/caregiver present to determine baseline cognitive functioning Arousal/Alertness: Awake/alert Orientation Level: Oriented to person;Disoriented to situation;Disoriented to place;Disoriented to time Year: Other (Comment) ("two thousand and.Marland KitchenMarland KitchenMarland KitchenI dont know") Day of Week: Incorrect Attention: Sustained;Focused Focused Attention: Appears intact Sustained Attention: Impaired Sustained Attention Impairment: Verbal complex Memory: Impaired Memory Impairment: Storage deficit;Retrieval deficit Awareness: Impaired Awareness Impairment: Intellectual impairment Problem Solving: Impaired Problem Solving Impairment: Verbal complex Behaviors: Restless;Impulsive;Perseveration Safety/Judgment: Impaired Comments: Patient not exhibiting awareness to why he is here and is fixated on wanting to get  IV out and go home       Comprehension  Auditory Comprehension Overall Auditory Comprehension: Appears within functional limits for tasks assessed    Expression Expression Primary Mode of Expression: Verbal Verbal Expression Overall Verbal Expression: Appears within functional limits for tasks assessed   Oral / Motor  Oral Motor/Sensory Function Overall Oral  Motor/Sensory Function: Within functional limits Motor Speech Overall Motor Speech: Appears within functional limits for tasks assessed Respiration: Within functional limits Resonance: Within functional limits Articulation: Within functional limitis Intelligibility: Intelligible Motor Planning: Witnin functional limits Motor Speech Errors: Not applicable            Sonia Baller, MA, CCC-SLP Speech Therapy

## 2021-12-02 NOTE — Evaluation (Addendum)
Occupational Therapy Evaluation Patient Details Name: [name] MRN: [id number] DOB: 1946-06-15 Today's Date: 12/02/2021   History of Present Illness Pt is a 76 y/o male who presented with AMS, hypokalemia and s/p mechanical fall. CT negative for acute fx. PMH: CHF, mitral valve replacement, a fib,  s/p CRT-P for bradycardia, CKD, HTN, BPH.   Clinical Impression   PTA, pt lives with family, typically Modified Independent with ADLs and mobility using cane. Pt presents now with minor deficits in cognition, standing balance and endurance. Pt endorses not feeling like himself and feeling like "I'm not even here". Overall, pt requires no more than min guard for ADLs and transfers with unsteadiness improving with increased activity. Pt with initial BP drop in standing that improved with prolonged standing (see vitals flowsheet for orthostatics). Anticipate pt to progress well towards PLOF with improving medical stability. Pt reports having good family support at home with no OT needs expected at DC. Will continue to follow acutely.      Recommendations for follow up therapy are one component of a multi-disciplinary discharge planning process, led by the attending physician.  Recommendations may be updated based on patient status, additional functional criteria and insurance authorization.   Follow Up Recommendations  No OT follow up    Assistance Recommended at Discharge Intermittent Supervision/Assistance  Patient can return home with the following A little help with bathing/dressing/bathroom;Assistance with cooking/housework;Help with stairs or ramp for entrance;Assist for transportation;Direct supervision/assist for medications management;Direct supervision/assist for financial management    Functional Status Assessment  Patient has had a recent decline in their functional status and demonstrates the ability to make significant improvements in function in a reasonable and predictable amount  of time.  Equipment Recommendations  None recommended by OT    Recommendations for Other Services       Precautions / Restrictions Precautions Precautions: Fall Precaution Comments: monitor orthostatics; O2 (does not wear at baseline) Restrictions Weight Bearing Restrictions: No      Mobility Bed Mobility Overal bed mobility: Modified Independent                  Transfers Overall transfer level: Needs assistance Equipment used: None Transfers: Sit to/from Stand Sit to Stand: Min guard           General transfer comment: for steadying due to initial shakiness, improved with progressive sit to stands      Balance Overall balance assessment: Mild deficits observed, not formally tested                                         ADL either performed or assessed with clinical judgement   ADL Overall ADL's : Needs assistance/impaired Eating/Feeding: Independent   Grooming: Supervision/safety;Standing   Upper Body Bathing: Sitting;Supervision/ safety   Lower Body Bathing: Sit to/from stand;Sitting/lateral leans;Min guard   Upper Body Dressing : Supervision/safety;Sitting   Lower Body Dressing: Min guard   Toilet Transfer: Min guard   Toileting- Clothing Manipulation and Hygiene: Supervision/safety Toileting - Clothing Manipulation Details (indicate cue type and reason): stood for urinal use at bedside without LOB       General ADL Comments: Pt endorses not feeling like himself and "not feeling like Im here" - improved steadiness and cognition with activity though still not at baseline     Vision Baseline Vision/History: 1 Wears glasses Ability to See in Adequate Light: 0 Adequate Patient  Visual Report: No change from baseline Vision Assessment?: No apparent visual deficits     Perception     Praxis      Pertinent Vitals/Pain Pain Assessment Pain Assessment: No/denies pain     Hand Dominance Right   Extremity/Trunk  Assessment Upper Extremity Assessment Upper Extremity Assessment: Overall WFL for tasks assessed   Lower Extremity Assessment Lower Extremity Assessment: Defer to PT evaluation   Cervical / Trunk Assessment Cervical / Trunk Assessment: Normal   Communication Communication Communication: No difficulties   Cognition Arousal/Alertness: Awake/alert Behavior During Therapy: WFL for tasks assessed/performed, Flat affect Overall Cognitive Status: Impaired/Different from baseline Area of Impairment: Orientation, Awareness, Problem solving, Safety/judgement                 Orientation Level: Disoriented to, Situation       Safety/Judgement: Decreased awareness of deficits, Decreased awareness of safety Awareness: Emergent Problem Solving: Slow processing, Difficulty sequencing, Requires verbal cues General Comments: Reports "not feeling like Im here" and not feeling like himself. Does want to attempt tasks without assist though wonders if he can walk around room without assist despite reporting feeling weak, not at baseline, etc.     General Comments  SpO2 97% on 2 L O2    Exercises     Shoulder Instructions      Home Living Family/patient expects to be discharged to:: Private residence Living Arrangements: Spouse/significant other;Children;Other relatives (daughter, 5 grandchildren) Available Help at Discharge: Family;Available 24 hours/day Type of Home: House Home Access: Stairs to enter [employer] of Steps: 5 Entrance Stairs-Rails: Right Home Layout: One level     Bathroom Shower/Tub: Teacher, early years/pre: Standard     Home Equipment: Cane - single point          Prior Functioning/Environment Prior Level of Function : Independent/Modified Independent             Mobility Comments: Uses cane for ambulation ADLs Comments: Performs all BADLs Mod I.  Family provides transportation as pt does not drive, and family performs all  household chores.        OT Problem List: Decreased activity tolerance;Decreased cognition;Decreased safety awareness      OT Treatment/Interventions: Self-care/ADL training;Therapeutic exercise;Energy conservation;Therapeutic activities    OT Goals(Current goals can be found in the care plan section) Acute Rehab OT Goals Patient Stated Goal: feel more like myself, get home OT Goal Formulation: With patient Time For Goal Achievement: 12/16/21 Potential to Achieve Goals: Good  OT Frequency: Min 2X/week    Co-evaluation              AM-PAC OT "6 Clicks" Daily Activity     Outcome Measure Help from another person eating meals?: None Help from another person taking care of personal grooming?: A Little Help from another person toileting, which includes using toliet, bedpan, or urinal?: A Little Help from another person bathing (including washing, rinsing, drying)?: A Little Help from another person to put on and taking off regular upper body clothing?: A Little Help from another person to put on and taking off regular lower body clothing?: A Little 6 Click Score: 19   End of Session Equipment Utilized During Treatment: Oxygen Nurse Communication: Mobility status (discussed with NT, orthostatics)  Activity Tolerance: Patient tolerated treatment well Patient left: in bed;with call bell/phone within reach;with bed alarm set;Other (comment) (NT entering and lab (?))  OT Visit Diagnosis: Unsteadiness on feet (R26.81)  [id number] OT Time Calculation (min): 26 min Charges:  OT General Charges $OT Visit: 1 Visit OT Evaluation $OT Eval Low Complexity: 1 Low OT Treatments $Self Care/Home Management : 8-22 mins  Malachy Chamber, OTR/L Acute Rehab Services Office: [phone]   Layla Maw 12/02/2021, 8:06 AM

## 2021-12-02 NOTE — Progress Notes (Addendum)
ANTICOAGULATION CONSULT NOTE - Follow Up Consult  Pharmacy Consult for Warfarin Indication:  h/o mitral valve replacement  No Known Allergies  Patient Measurements: Height: 5\' 8"  (172.7 cm) Weight: 88.8 kg (195 lb 11.2 oz) IBW/kg (Calculated) : 68.4  Vital Signs: Temp: 98.7 F (37.1 C) (05/30 0427) Temp Source: Oral (05/30 0427) BP: 142/97 (05/30 0427) Pulse Rate: 101 (05/30 0427)  Labs: Recent Labs    12/01/21 1146 12/02/21 0001  HGB 9.1* 9.8*  HCT 32.3* 35.2*  PLT 453* 493*  LABPROT 38.2* 34.6*  INR 3.9* 3.5*  CREATININE 1.99* 1.86*    Estimated Creatinine Clearance: 35.5 mL/min (A) (by C-G formula based on SCr of 1.86 mg/dL (H)).   Medications:  Medications Prior to Admission  Medication Sig Dispense Refill Last Dose   acetaminophen (TYLENOL) 500 MG tablet Take 1,000 mg by mouth every 6 (six) hours as needed (for pain).   unk   atorvastatin (LIPITOR) 20 MG tablet Take 1 tablet (20 mg total) by mouth every evening. (Patient taking differently: Take 20 mg by mouth See admin instructions. Take 20 mg by mouth at 4 PM every day) 30 tablet 2 11/30/2021 at 1600   carvedilol (COREG) 6.25 MG tablet Take 1 tablet (6.25 mg total) by mouth 2 (two) times daily with a meal. 180 tablet 3 12/01/2021 at 1000   pantoprazole (PROTONIX) 40 MG tablet TAKE 1 TABLET (40 MG TOTAL) BY MOUTH TWICE A DAY BEFORE MEALS (Patient taking differently: Take 40 mg by mouth 2 (two) times daily before a meal.) 180 tablet 1 12/01/2021 at am   tamsulosin (FLOMAX) 0.4 MG CAPS capsule Take 2 capsules (0.8 mg total) by mouth daily after breakfast. 180 capsule 3 12/01/2021 at am   torsemide (DEMADEX) 20 MG tablet TAKE 1 TABLET BY MOUTH TWICE A DAY (Patient taking differently: Take 20 mg by mouth 2 (two) times daily.) 180 tablet 0 12/01/2021 at am   warfarin (COUMADIN) 5 MG tablet Take warfarin 5 mg tab evening of 4/23 and follow-up in [facility] on Monday with Dr. Elie Confer. (Patient taking  differently: Take 2.5-5 mg by mouth See admin instructions. Take 2.5 mg by mouth in the morning on Sun/Sat and 5 mg on Mon/Tues/Wed/Thurs/Fri) 30 tablet 11 12/01/2021 at 1000   diclofenac Sodium (VOLTAREN) 1 % GEL APPLY 4 G TOPICALLY 4 TIMES DAILY (Patient not taking: Reported on 12/01/2021) 400 g 11 Not Taking   traMADol (ULTRAM) 50 MG tablet Take 1 tablet (50 mg total) by mouth every 6 (six) hours as needed. (Patient not taking: Reported on 12/01/2021) 12 tablet 0 Not Taking    Assessment: 9 yom with a history of anemia, endocarditis s/p MVR, GIB presenting with fall. Warfarin per pharmacy consult placed for h/o mitral valve replacement with mechanical valve.   Home dose of warfarin is 2.5 mg (5 mg x 0.5) every Sun, Sat; 5 mg (5 mg x 1) all other days per last anti-coag visit (5/22). Last dose of warfarin was 5/29 AM per patient and friend that helps with medications.    INR: 3.5, therapeutic Hgb 9.8; Plt 493 - stable No s/sx of bleeding noted  Goal of Therapy:  INR 2.5-3.5 Monitor platelets by anticoagulation protocol: Yes   Plan:  Warfarin 2.5mg  x 1 tonight Daily INR and CBC  Monitor for s/sx of bleeding  Kaleen Mask 12/02/2021,7:28 AM

## 2021-12-03 ENCOUNTER — Inpatient Hospital Stay (HOSPITAL_COMMUNITY): Payer: 59

## 2021-12-03 DIAGNOSIS — F05 Delirium due to known physiological condition: Secondary | ICD-10-CM

## 2021-12-03 LAB — BASIC METABOLIC PANEL
Anion gap: 10 (ref 5–15)
BUN: 29 mg/dL — ABNORMAL HIGH (ref 8–23)
CO2: 26 mmol/L (ref 22–32)
Calcium: 9.4 mg/dL (ref 8.9–10.3)
Chloride: 103 mmol/L (ref 98–111)
Creatinine, Ser: 2.01 mg/dL — ABNORMAL HIGH (ref 0.61–1.24)
GFR, Estimated: 33 mL/min — ABNORMAL LOW (ref >60)
Glucose, Bld: 116 mg/dL — ABNORMAL HIGH (ref 70–99)
Potassium: 3.8 mmol/L (ref 3.5–5.1)
Sodium: 139 mmol/L (ref 135–145)

## 2021-12-03 LAB — PROTIME-INR
INR: 3.7 — ABNORMAL HIGH (ref 0.8–1.2)
Prothrombin Time: 36.5 seconds — ABNORMAL HIGH (ref 11.4–15.2)

## 2021-12-03 LAB — CBC
HCT: 33.6 % — ABNORMAL LOW (ref 39.0–52.0)
Hemoglobin: 9.2 g/dL — ABNORMAL LOW (ref 13.0–17.0)
MCH: 19.8 pg — ABNORMAL LOW (ref 26.0–34.0)
MCHC: 27.4 g/dL — ABNORMAL LOW (ref 30.0–36.0)
MCV: 72.4 fL — ABNORMAL LOW (ref 80.0–100.0)
Platelets: 425 10*3/uL — ABNORMAL HIGH (ref 150–400)
RBC: 4.64 MIL/uL (ref 4.22–5.81)
RDW: 20.4 % — ABNORMAL HIGH (ref 11.5–15.5)
WBC: 7.9 10*3/uL (ref 4.0–10.5)
nRBC: 0.4 % — ABNORMAL HIGH (ref 0.0–0.2)

## 2021-12-03 LAB — MAGNESIUM: Magnesium: 1.9 mg/dL (ref 1.7–2.4)

## 2021-12-03 MED ORDER — FUROSEMIDE 10 MG/ML IJ SOLN
40.0000 mg | Freq: Once | INTRAMUSCULAR | Status: AC
  Administered 2021-12-03: 40 mg via INTRAVENOUS
  Filled 2021-12-03: qty 4

## 2021-12-03 MED ORDER — WARFARIN 0.5 MG HALF TABLET
0.5000 mg | ORAL_TABLET | Freq: Once | ORAL | Status: AC
  Administered 2021-12-03: 0.5 mg via ORAL
  Filled 2021-12-03: qty 1

## 2021-12-03 MED ORDER — OLANZAPINE 5 MG PO TBDP
5.0000 mg | ORAL_TABLET | Freq: Every day | ORAL | Status: DC
  Administered 2021-12-03: 5 mg via ORAL
  Filled 2021-12-03 (×2): qty 1

## 2021-12-03 MED ORDER — POTASSIUM CHLORIDE CRYS ER 20 MEQ PO TBCR
40.0000 meq | EXTENDED_RELEASE_TABLET | Freq: Two times a day (BID) | ORAL | Status: AC
  Administered 2021-12-03: 40 meq via ORAL
  Filled 2021-12-03: qty 2

## 2021-12-03 MED ORDER — SODIUM CHLORIDE 0.9 % IV SOLN
250.0000 mg | Freq: Every day | INTRAVENOUS | Status: DC
  Administered 2021-12-03 – 2021-12-05 (×3): 250 mg via INTRAVENOUS
  Filled 2021-12-03 (×3): qty 20

## 2021-12-03 MED ORDER — OLANZAPINE 5 MG PO TABS
2.5000 mg | ORAL_TABLET | Freq: Once | ORAL | Status: AC
  Administered 2021-12-03: 2.5 mg via ORAL
  Filled 2021-12-03: qty 1

## 2021-12-03 NOTE — Progress Notes (Signed)
Subjective:  ON: Patient noted to be agitated and wanting to leave the hospital. Received 2 doses of seroquel and 1 dose of zyprexa.  Patient seen and assessed at bedside. Noted to be sleeping comfortably. No complaints or concerns at this time.  Objective:  Vital signs in last 24 hours: Vitals:   12/02/21 1019 12/02/21 1020 12/02/21 1518 12/03/21 0801  BP: 110/73 128/89 125/87 (!) 119/101  Pulse:   94 (!) 103  Resp:   18 16  Temp:   98.2 F (36.8 C) 98.4 F (36.9 C)  TempSrc:   Oral Oral  SpO2:   96% 94%  Weight:      Height:       General: NAD, chronically ill-appearing HE: Normocephalic, atraumatic ENT: No congestion, no rhinorrhea, no exudate or erythema  Cardiovascular: RRR Pulmonary: Sleeping comfortably on 2L [place] in no obvious respiratory distress Abdominal: soft, nontender, bowel sounds present Musculoskeletal: no deformity or injury in extremities. Skin: Warm, dry, no bruising, erythema, or rash Psychiatric/Behavioral: normal mood, normal behavior      Assessment/Plan:  Principal Problem:   Hypokalemia Active Problems:   Acute exacerbation of CHF (congestive heart failure) (HCC)   Accident due to mechanical fall without injury  Mr. Norvell Ralston is a 76 year old M with a PMH of HFrEF (EF 35 to 40% 10/2021) 2/2 NICM, IE s/p mechanical mitral valve replacement on chronic AC with coumadin, atrial flutter/fibrillation, s/p CRT-P for bradycardia and fatigue, CKD IIIb, HTN, and BPH. Admitted for hypokalemia and acute on chronic systolic and diastolic HF with a new O2 requirement.  #Acute on chronic systolic and diastolic heart failure  Patient noted to have persistent lower extremity edema at home despite increased doses of his torsemide in the outpatient setting though because of his cognitive impairment may be prohibiting his medication adherence. Received a few doses of IV Lasix 40 mg while in the hospital and has had a net output of 2 L since admit. Cr has been  overall stable.  -Wean O2 as able  -Strict I/Os -Daily weights  -Consider resuming his home torsemide at DC   #Hypokalemia, improving Patient with severe hypokalemia likely 2/2 loop diuretic use. K of 3.7 at 8 PM last night. He is currently receiving PO potassium repletion. Magnesium is WNL today. -Trend BMP, Magnesium -Replete for K < 4, Mag < 2  #Mechanical fall Patient experienced a mechanical fall in the setting of chronic anticoagulation. He denies any pain. CT head without contrast showed no evidence of bleeding. Orthostatics are WNL. PT/OT do not recommend follow-up. Will reach out again to ensure that patient would be safe for DC given recent events (agitation). Will also reach out to family and reassess this afternoon when patient is awake. -Reach out to PT/OT -Reach out to family and reassess later this afternoon   #MR s/p MVR on coumadin  INR supratherapeutic on admission to 3.9.  -Appreciate pharmacy's assistance   #Atrial fibrillation  Afib on admission. -Continue home coumadin and coreg   #Microcytic anemia  Iron studies are reflective of an iron deficient state -Continue IV iron  CKD3b BL ~1.7-1.8. 1.9 on admission Stable.  -Trend BMP -Avoid nephrotoxic medications  HTN Normotensive.  -Continue home medication regimen  Prior to Admission Living Arrangement: Home Anticipated Discharge Location: Pending Barriers to Discharge: Dispo planning Dispo: Anticipated discharge pending dispo  Andrey Campanile, MD 12/03/2021, 8:14 AM Pager: [phone]  After 5pm on weekdays and 1pm on weekends: On Call pager [phone]

## 2021-12-03 NOTE — Progress Notes (Signed)
ANTICOAGULATION CONSULT NOTE - Follow Up Consult  Pharmacy Consult for Warfarin Indication:  h/o mitral valve replacement  No Known Allergies  Patient Measurements: Height: 5\' 8"  (172.7 cm) Weight: 88.8 kg (195 lb 11.2 oz) IBW/kg (Calculated) : 68.4  Vital Signs: Temp: 98.4 F (36.9 C) (05/31 0801) Temp Source: Oral (05/31 0801) BP: 119/101 (05/31 0801) Pulse Rate: 103 (05/31 0801)  Labs: Recent Labs    12/01/21 1146 12/02/21 0001 12/03/21 0625  HGB 9.1* 9.8* 9.2*  HCT 32.3* 35.2* 33.6*  PLT 453* 493* 425*  LABPROT 38.2* 34.6* 36.5*  INR 3.9* 3.5* 3.7*  CREATININE 1.99* 1.86* 2.01*     Estimated Creatinine Clearance: 32.8 mL/min (A) (by C-G formula based on SCr of 2.01 mg/dL (H)).   Medications:  Medications Prior to Admission  Medication Sig Dispense Refill Last Dose   acetaminophen (TYLENOL) 500 MG tablet Take 1,000 mg by mouth every 6 (six) hours as needed (for pain).   unk   atorvastatin (LIPITOR) 20 MG tablet Take 1 tablet (20 mg total) by mouth every evening. (Patient taking differently: Take 20 mg by mouth See admin instructions. Take 20 mg by mouth at 4 PM every day) 30 tablet 2 11/30/2021 at 1600   carvedilol (COREG) 6.25 MG tablet Take 1 tablet (6.25 mg total) by mouth 2 (two) times daily with a meal. 180 tablet 3 12/01/2021 at 1000   pantoprazole (PROTONIX) 40 MG tablet TAKE 1 TABLET (40 MG TOTAL) BY MOUTH TWICE A DAY BEFORE MEALS (Patient taking differently: Take 40 mg by mouth 2 (two) times daily before a meal.) 180 tablet 1 12/01/2021 at am   tamsulosin (FLOMAX) 0.4 MG CAPS capsule Take 2 capsules (0.8 mg total) by mouth daily after breakfast. 180 capsule 3 12/01/2021 at am   torsemide (DEMADEX) 20 MG tablet TAKE 1 TABLET BY MOUTH TWICE A DAY (Patient taking differently: Take 20 mg by mouth 2 (two) times daily.) 180 tablet 0 12/01/2021 at am   warfarin (COUMADIN) 5 MG tablet Take warfarin 5 mg tab evening of 4/23 and follow-up in [facility] on Monday with Dr. Elie Confer. (Patient taking differently: Take 2.5-5 mg by mouth See admin instructions. Take 2.5 mg by mouth in the morning on Sun/Sat and 5 mg on Mon/Tues/Wed/Thurs/Fri) 30 tablet 11 12/01/2021 at 1000   diclofenac Sodium (VOLTAREN) 1 % GEL APPLY 4 G TOPICALLY 4 TIMES DAILY (Patient not taking: Reported on 12/01/2021) 400 g 11 Not Taking   traMADol (ULTRAM) 50 MG tablet Take 1 tablet (50 mg total) by mouth every 6 (six) hours as needed. (Patient not taking: Reported on 12/01/2021) 12 tablet 0 Not Taking    Assessment: 89 yom with a history of anemia, endocarditis s/p MVR, GIB presenting with fall. Warfarin per pharmacy consult placed for h/o mitral valve replacement with mechanical valve.   Home dose of warfarin is 2.5 mg (5 mg x 0.5) every Sun, Sat; 5 mg (5 mg x 1) all other days per last anti-coag visit (5/22). Last dose of warfarin was 5/29 AM per patient and friend that helps with medications.    INR: 3.7, therapeutic Hgb 9.2; Plt 425 - stable No s/sx of bleeding noted  Goal of Therapy:  INR 2.5-3.5 Monitor platelets by anticoagulation protocol: Yes   Plan:  Warfarin 0.5 mg x 1 tonight Daily INR and CBC  Monitor for s/sx of bleeding  Kaleen Mask 12/03/2021,8:25 AM

## 2021-12-03 NOTE — Progress Notes (Signed)
Mobility Specialist Progress Note:   12/03/21 1045  Mobility  Activity  (bed level exercises)  Range of Motion/Exercises Active  Level of Assistance Independent  Assistive Device None  Activity Response Tolerated fair  $Mobility charge 1 Mobility   Pt asleep on arrival, mildly difficult to arouse. Noted pt on 2LO2, SpO2 87% while supine. Recovered to 92% with PLB. Performed exercises in bed, pt quickly falling back to sleep. NT in room suspects d/t meds. Will f/u again later today for hallway ambulation.  Addison Lank Acute Rehab Secure Chat or Office Phone: [phone]

## 2021-12-03 NOTE — Progress Notes (Signed)
Physical Therapy Treatment Patient Details Name: [name] MRN: [id number] DOB: 1946-06-25 Today's Date: 12/03/2021   History of Present Illness Pt is a 76 y/o male who presented with AMS, hypokalemia and s/p mechanical fall. CT negative for acute fx. PMH: CHF, mitral valve replacement, a fib,  s/p CRT-P for bradycardia, CKD, HTN, BPH.    PT Comments    Pt with flat affect today, decreased engagement with therapist this session. Pt was modI for bed mobility, supervision for transfers and ambulation of 149ft with RW as well as min guard for 7 steps with L rail. Pt ambulated on RA but required frequent rest breaks and appeared to be SOB on the stairs. Upon return to the room, pts O2 stats were 82% with poor pleth, returned to mid 90s on 2L and with deep breathing. Pt appears to be at a functional level that is safe to discharge home but may require supervision due to cognitive status and decreased tolerance to functional activity Pt demonstrated functional decline this session with increased fatigue placing him at risk for falls at home. Recommending HHPT upon discharge to address balance and decreased tolerance to functional activity.    Recommendations for follow up therapy are one component of a multi-disciplinary discharge planning process, led by the attending physician.  Recommendations may be updated based on patient status, additional functional criteria and insurance authorization.  Follow Up Recommendations  Home health PT     Assistance Recommended at Discharge PRN  Patient can return home with the following Assistance with cooking/housework;Direct supervision/assist for medications management;Direct supervision/assist for financial management;Assist for transportation;Help with stairs or ramp for entrance   Equipment Recommendations  None recommended by PT    Recommendations for Other Services       Precautions / Restrictions Precautions Precautions: Fall Precaution  Comments: O2 (does not wear at baseline) Restrictions Weight Bearing Restrictions: No     Mobility  Bed Mobility Overal bed mobility: Modified Independent             General bed mobility comments: HOB elevated, increased time    Transfers Overall transfer level: Needs assistance Equipment used: Rolling walker (2 wheels) Transfers: Sit to/from Stand Sit to Stand: Supervision           General transfer comment: from EOB and recliner, no LOB, supervision for safety    Ambulation/Gait Ambulation/Gait assistance: Supervision Gait Distance (Feet): 100 Feet Assistive device: Rolling walker (2 wheels) Gait Pattern/deviations: Trunk flexed, Decreased stride length Gait velocity: decreased Gait velocity interpretation: 1.31 - 2.62 ft/sec, indicative of limited community ambulator   General Gait Details: Initially trialed single point cane that pt reports he uses at home, pt requested to use RW instead. Cues for keeping RW close and on the ground. Unable to complete dual cog task of reading room numbers, pt had to stop ambulation to read numbers. Pt requiring multiple standing rest breaks and requesting to sit after 143ft.   Stairs Stairs: Yes Stairs assistance: Min guard Stair Management: One rail Left, Step to pattern Number of Stairs: 7 General stair comments: cues for step to pattern, pt with increased SOB, therapist had to limit pt to 7 steps due to breathing pattern. C/o dizziness after stairs   Wheelchair Mobility    Modified Rankin (Stroke Patients Only)       Balance Overall balance assessment: Mild deficits observed, not formally tested  Cognition Arousal/Alertness: Lethargic, Suspect due to medications Behavior During Therapy: WFL for tasks assessed/performed, Flat affect Overall Cognitive Status: No family/caregiver present to determine baseline cognitive functioning Area of Impairment:  Safety/judgement, Problem solving                         Safety/Judgement: Decreased awareness of deficits, Decreased awareness of safety Awareness: Intellectual Problem Solving: Slow processing, Requires verbal cues, Difficulty sequencing General Comments: Unable to complete dual task during ambulation, had to stop walking to read room numbers. repeatedly lifting up RW from ground        Exercises      General Comments        Pertinent Vitals/Pain Pain Assessment Pain Assessment: No/denies pain    Home Living                          Prior Function            PT Goals (current goals can now be found in the care plan section) Acute Rehab PT Goals Patient Stated Goal: to return home PT Goal Formulation: With patient Time For Goal Achievement: 12/16/21 Potential to Achieve Goals: Good Progress towards PT goals: Progressing toward goals    Frequency    Min 3X/week      PT Plan Current plan remains appropriate    Co-evaluation              AM-PAC PT "6 Clicks" Mobility   Outcome Measure  Help needed turning from your back to your side while in a flat bed without using bedrails?: None Help needed moving from lying on your back to sitting on the side of a flat bed without using bedrails?: None Help needed moving to and from a bed to a chair (including a wheelchair)?: A Little Help needed standing up from a chair using your arms (e.g., wheelchair or bedside chair)?: A Little Help needed to walk in hospital room?: A Little Help needed climbing 3-5 steps with a railing? : A Little 6 Click Score: 20    End of Session Equipment Utilized During Treatment: Gait belt Activity Tolerance: Patient limited by fatigue;Patient limited by lethargy Patient left: with nursing/sitter in room Nurse Communication: Mobility status PT Visit Diagnosis: Unsteadiness on feet (R26.81);Difficulty in walking, not elsewhere classified (R26.2)     Time:  [id number] PT Time Calculation (min) (ACUTE ONLY): 21 min  Charges:  $Gait Training: 8-22 mins                     Mackie Pai, SPT Acute Rehabilitation Services  Office: [place] 12/03/2021, 2:00 PM

## 2021-12-03 NOTE — Progress Notes (Signed)
Mobility Specialist Progress Note:   12/03/21 1400  Mobility  Activity Ambulated with assistance in hallway  Level of Assistance Standby assist, set-up cues, supervision of patient - no hands on  Assistive Device Front wheel walker  Distance Ambulated (ft) 200 ft  Activity Response Tolerated well  $Mobility charge 1 Mobility   During Mobility: 91% RA Post Mobility: 87% RA  Pt received ambulating in hallway with NT. Required minG throughout session. SpO2 91% on RA throughout, desat to 87% on RA post-ambulation. Pt placed on 2LO2 to recover. Bed alarm on, sitter in room.   Nelta Numbers Acute Rehab Secure Chat or Office Phone: [phone]

## 2021-12-03 NOTE — Progress Notes (Addendum)
SATURATION QUALIFICATIONS: (This note is used to comply with regulatory documentation for home oxygen)  Patient Saturations on Room Air at Rest = 82%  Patient Saturations on Room Air while Ambulating = 82%  Patient Saturations on 2 Liters of oxygen at rest= 92%  Please briefly explain why patient needs home oxygen: Pt is unable to maintain O2 sats >82% during functional activity on RA at this time.   Conni Slipper, PT, DPT Acute Rehabilitation Services Secure Chat Preferred Office: [phone]

## 2021-12-03 NOTE — TOC Initial Note (Addendum)
Transition of Care [facility]) - Initial/Assessment Note    Patient Details  Name: [name] MRN: [id number] Date of Birth: [birth year]  [facility] CM/SW Contact:    Carles Collet, RN Phone Number: 12/03/2021, 4:06 PM  Clinical Narrative:                Spoke to patient at bedside. Discussed needs for home oxygen.  Oxygen order requested from MD, and referral accepted by [employer]. O2 to be delivered to room either tonight or in the morning Unable to establish [facility] PT. Spoke with [name], she states that [place] and her live together and share a child, she confirmed address and that she will be able to provide transportation home  [place] [phone]  [phone]      Expected Discharge Plan: Home/Self Care Barriers to Discharge: Continued Medical Work up   Patient Goals and CMS Choice Patient states their goals for this hospitalization and ongoing recovery are:: to go home      Expected Discharge Plan and Services Expected Discharge Plan: Home/Self Care   Discharge Planning Services: CM Consult   Living arrangements for the past 2 months: Single Family Home                 DME Arranged: Oxygen DME Agency: [employer] Date DME Agency Contacted: 12/03/21 Time DME Agency Contacted: 57 Representative spoke with at DME Agency: Brenton Grills            Prior Living Arrangements/Services Living arrangements for the past 2 months: [place] Lives with:: Significant Other                   Activities of Daily Living Home Assistive Devices/Equipment: Cane (specify quad or straight) ADL Screening (condition at time of admission) Patient's cognitive ability adequate to safely complete daily activities?: Yes Is the patient deaf or have difficulty hearing?: No Does the patient have difficulty seeing, even when wearing glasses/contacts?: No Does the patient have difficulty concentrating, remembering, or making decisions?:  No Patient able to express need for assistance with ADLs?: Yes Does the patient have difficulty dressing or bathing?: No Independently performs ADLs?: Yes (appropriate for developmental age) Does the patient have difficulty walking or climbing stairs?: No Weakness of Legs: None Weakness of Arms/Hands: None  Permission Sought/Granted                  Emotional Assessment              Admission diagnosis:  Hypoxemia [R09.02] Hypokalemia [E87.6] Injury of head, initial encounter [S09.90XA] Acute exacerbation of CHF (congestive heart failure) ([place]) [I50.9] Patient Active Problem List   Diagnosis Date Noted   Sundowning    Accident due to mechanical fall without injury    Hypokalemia 12/01/2021   Right knee pain 11/10/2021   Sleep disturbances 11/05/2021   Atrial fibrillation ([place]) 10/24/2021   Acute exacerbation of CHF (congestive heart failure) ([place]) 10/24/2021   Pre-diabetes 09/23/2021   TIA (transient ischemic attack) 09/15/2021   GI bleed 06/27/2021   Inguinal hernia, right 04/21/2021   Lower leg pain 02/12/2021   Stage 3b chronic kidney disease (CKD) ([place]) 03/26/2016   Dementia ([place]) 12/24/2015   H/O mitral valve replacement with mechanical valve 11/13/2015   Benign prostatic hyperplasia with nocturia 11/13/2015   Healthcare maintenance 11/13/2015   History of arteriovenous malformation (AVM) 03/28/2014   Chronic biventricular, systolic heart failure ([place]) 06/02/2011   Essential hypertension 09/09/2008   History  of atrial flutter 03/13/2008   PCP:  [name], MD Pharmacy:   [id number] - [place], [place] [id number] [place] [id number] Phone: [phone] Fax: [phone]     Social Determinants of Health (SDOH) Interventions    Readmission Risk Interventions     View : No data to display.

## 2021-12-03 NOTE — Progress Notes (Signed)
IMTS Interval Progress Note:  Informed by nursing staff that patient continues to be agitated despite low dose seroquel given earlier. Patient evaluated at bedside. Patient's daughter is present at bedside and assisting with reorientation. Patient was able to fall asleep after several attempts at reorientation with daughter at bedside.  Advised patient's daughter to have family member at bedside for which she is agreeable.  However, shortly after, informed by RN that patient continues to be agitated and attempting to get out of bed. Daughter is unable to stay the night. Requesting something to help with agitation. Given another dose of seroquel 25mg . Also ordered 1:1 sitter to assist with reorientation/redirection.  Patient's agitation and confusion consistent with hospital delirium and will continue to monitor and continue to practice delirium precautions.    , MD Internal Medicine, PGY-3 12/03/21 12:59 AM Pager # [phone]

## 2021-12-04 LAB — BASIC METABOLIC PANEL
Anion gap: 9 (ref 5–15)
BUN: 34 mg/dL — ABNORMAL HIGH (ref 8–23)
CO2: 23 mmol/L (ref 22–32)
Calcium: 9.4 mg/dL (ref 8.9–10.3)
Chloride: 107 mmol/L (ref 98–111)
Creatinine, Ser: 2.05 mg/dL — ABNORMAL HIGH (ref 0.61–1.24)
GFR, Estimated: 33 mL/min — ABNORMAL LOW (ref >60)
Glucose, Bld: 87 mg/dL (ref 70–99)
Potassium: 3.7 mmol/L (ref 3.5–5.1)
Sodium: 139 mmol/L (ref 135–145)

## 2021-12-04 LAB — CBC
HCT: 30.3 % — ABNORMAL LOW (ref 39.0–52.0)
Hemoglobin: 8.6 g/dL — ABNORMAL LOW (ref 13.0–17.0)
MCH: 20.3 pg — ABNORMAL LOW (ref 26.0–34.0)
MCHC: 28.4 g/dL — ABNORMAL LOW (ref 30.0–36.0)
MCV: 71.6 fL — ABNORMAL LOW (ref 80.0–100.0)
Platelets: 388 10*3/uL (ref 150–400)
RBC: 4.23 MIL/uL (ref 4.22–5.81)
RDW: 20.4 % — ABNORMAL HIGH (ref 11.5–15.5)
WBC: 6.8 10*3/uL (ref 4.0–10.5)
nRBC: 1.3 % — ABNORMAL HIGH (ref 0.0–0.2)

## 2021-12-04 LAB — PROTIME-INR
INR: 4.2 (ref 0.8–1.2)
Prothrombin Time: 40.3 seconds — ABNORMAL HIGH (ref 11.4–15.2)

## 2021-12-04 LAB — MAGNESIUM: Magnesium: 1.8 mg/dL (ref 1.7–2.4)

## 2021-12-04 MED ORDER — FUROSEMIDE 10 MG/ML IJ SOLN: 40.0000 mg | Freq: Two times a day (BID) | INTRAMUSCULAR | Status: DC

## 2021-12-04 MED ORDER — FUROSEMIDE 10 MG/ML IJ SOLN
40.0000 mg | Freq: Two times a day (BID) | INTRAMUSCULAR | Status: DC
  Administered 2021-12-04 – 2021-12-05 (×3): 40 mg via INTRAVENOUS
  Filled 2021-12-04 (×3): qty 4

## 2021-12-04 MED ORDER — POTASSIUM CHLORIDE CRYS ER 20 MEQ PO TBCR
40.0000 meq | EXTENDED_RELEASE_TABLET | Freq: Two times a day (BID) | ORAL | Status: AC
Start: 2021-12-04 — End: 2021-12-04
  Administered 2021-12-04 (×2): 40 meq via ORAL
  Filled 2021-12-04 (×2): qty 2

## 2021-12-04 MED ORDER — MAGNESIUM SULFATE 2 GM/50ML IV SOLN
2.0000 g | Freq: Once | INTRAVENOUS | Status: AC
  Administered 2021-12-04: 2 g via INTRAVENOUS
  Filled 2021-12-04: qty 50

## 2021-12-04 NOTE — Progress Notes (Signed)
ANTICOAGULATION CONSULT NOTE - Follow Up Consult  Pharmacy Consult for Warfarin Indication:  h/o mitral valve replacement  No Known Allergies  Patient Measurements: Height: 5\' 8"  (172.7 cm) Weight: 89.5 kg (197 lb 5 oz) IBW/kg (Calculated) : 68.4  Vital Signs: Temp: 97.5 F (36.4 C) (06/01 0416) Temp Source: Oral (06/01 0416) BP: 111/63 (06/01 0416) Pulse Rate: 77 (06/01 0416)  Labs: Recent Labs    12/02/21 0001 12/03/21 0625 12/04/21 0652  HGB 9.8* 9.2* 8.6*  HCT 35.2* 33.6* 30.3*  PLT 493* 425* 388  LABPROT 34.6* 36.5* 40.3*  INR 3.5* 3.7* 4.2*  CREATININE 1.86* 2.01* 2.05*    Estimated Creatinine Clearance: 32.3 mL/min (A) (by C-G formula based on SCr of 2.05 mg/dL (H)).   Medications:  Medications Prior to Admission  Medication Sig Dispense Refill Last Dose   acetaminophen (TYLENOL) 500 MG tablet Take 1,000 mg by mouth every 6 (six) hours as needed (for pain).   unk   atorvastatin (LIPITOR) 20 MG tablet Take 1 tablet (20 mg total) by mouth every evening. (Patient taking differently: Take 20 mg by mouth See admin instructions. Take 20 mg by mouth at 4 PM every day) 30 tablet 2 11/30/2021 at 1600   carvedilol (COREG) 6.25 MG tablet Take 1 tablet (6.25 mg total) by mouth 2 (two) times daily with a meal. 180 tablet 3 12/01/2021 at 1000   pantoprazole (PROTONIX) 40 MG tablet TAKE 1 TABLET (40 MG TOTAL) BY MOUTH TWICE A DAY BEFORE MEALS (Patient taking differently: Take 40 mg by mouth 2 (two) times daily before a meal.) 180 tablet 1 12/01/2021 at am   tamsulosin (FLOMAX) 0.4 MG CAPS capsule Take 2 capsules (0.8 mg total) by mouth daily after breakfast. 180 capsule 3 12/01/2021 at am   torsemide (DEMADEX) 20 MG tablet TAKE 1 TABLET BY MOUTH TWICE A DAY (Patient taking differently: Take 20 mg by mouth 2 (two) times daily.) 180 tablet 0 12/01/2021 at am   warfarin (COUMADIN) 5 MG tablet Take warfarin 5 mg tab evening of 4/23 and follow-up in [facility] on  Monday with Dr. Elie Confer. (Patient taking differently: Take 2.5-5 mg by mouth See admin instructions. Take 2.5 mg by mouth in the morning on Sun/Sat and 5 mg on Mon/Tues/Wed/Thurs/Fri) 30 tablet 11 12/01/2021 at 1000   diclofenac Sodium (VOLTAREN) 1 % GEL APPLY 4 G TOPICALLY 4 TIMES DAILY (Patient not taking: Reported on 12/01/2021) 400 g 11 Not Taking   traMADol (ULTRAM) 50 MG tablet Take 1 tablet (50 mg total) by mouth every 6 (six) hours as needed. (Patient not taking: Reported on 12/01/2021) 12 tablet 0 Not Taking    Assessment: 52 yom with a history of anemia, endocarditis s/p MVR, GIB presenting with fall. Warfarin per pharmacy consult placed for h/o mitral valve replacement with mechanical valve.   Home dose of warfarin is 2.5 mg (5 mg x 0.5) every Sun, Sat; 5 mg (5 mg x 1) all other days per last anti-coag visit (5/22). Last dose of warfarin PTA was 5/29 AM per patient and friend that helps with medications.    INR: 4.2, supratherapeutic Hgb 8.6; Plt 388 - stable No s/sx of bleeding noted Patient only eating 20-25% of meals  Goal of Therapy:  INR 2.5-3.5 Monitor platelets by anticoagulation protocol: Yes   Plan:  HOLD warfarin Daily INR and CBC  Monitor for s/sx of bleeding  Kaleen Mask 12/04/2021,7:39 AM

## 2021-12-04 NOTE — Progress Notes (Signed)
Occupational Therapy Treatment Patient Details Name: [name] MRN: [id number] DOB: [birth year] Today's Date: 12/04/2021   History of present illness Pt is a 76 y/o male who presented with AMS, hypokalemia and s/p mechanical fall. CT negative for acute fx. PMH: CHF, mitral valve replacement, a fib,  s/p CRT-P for bradycardia, CKD, HTN, BPH.   OT comments  Session focused on ADLs standing/seated at sink. Pt with continued confusion noted, as well as DOE requiring 2 L O2 to maintain >96%. Pt opted to use RW during session demonstrating some insight into deficits. Emphasis on energy conservation strategies during bathing/dressing tasks at sink, including seated rest breaks and pursed lip breathing. Overall, pt requires grossly Supervision to complete ADLs and min guard for short distance mobility. Based on continued cognitive/physical deficits, updated recs to [facility] and recommend 24/7 family supervision at home. Pt reports he is never home more than 30 min at a time - would need to confirm with family.    Recommendations for follow up therapy are one component of a multi-disciplinary discharge planning process, led by the attending physician.  Recommendations may be updated based on patient status, additional functional criteria and insurance authorization.    Follow Up Recommendations  Home health OT    Assistance Recommended at Discharge Frequent or constant Supervision/Assistance  Patient can return home with the following  A little help with walking and/or transfers;A little help with bathing/dressing/bathroom;Assistance with cooking/housework;Direct supervision/assist for medications management;Direct supervision/assist for financial management;Assist for transportation   Equipment Recommendations  Other (comment) (Rolling walker)    Recommendations for Other Services      Precautions / Restrictions Precautions Precautions: Fall Precaution Comments: monitor O2 (does not wear at  baseline) Restrictions Weight Bearing Restrictions: No       Mobility Bed Mobility               General bed mobility comments: sitting EOB on entry    Transfers Overall transfer level: Needs assistance Equipment used: Rolling walker (2 wheels), None Transfers: Sit to/from Stand Sit to Stand: Supervision           General transfer comment: from bedside and BSC (seated at sink)     Balance Overall balance assessment: Mild deficits observed, not formally tested                                         ADL either performed or assessed with clinical judgement   ADL Overall ADL's : Needs assistance/impaired     Grooming: Supervision/safety;Standing;Wash/dry face   Upper Body Bathing: Supervision/ safety;Sitting   Lower Body Bathing: Supervison/ safety;Sit to/from stand Lower Body Bathing Details (indicate cue type and reason): able to prop feet on RW side bars to bathe feet though increased effort required Upper Body Dressing : Supervision/safety;Sitting   Lower Body Dressing: Minimal assistance;Sit to/from stand Lower Body Dressing Details (indicate cue type and reason): to manage socks, typically props feet up to don socks at home             Functional mobility during ADLs: Min guard;Rolling walker (2 wheels) General ADL Comments: Cognitive deficits remain with new SOB during activity requiring supplemental O2. Encouraged energy conservation strategies, rest breaks and pursed lip breathing to minimize SOB    Extremity/Trunk Assessment Upper Extremity Assessment Upper Extremity Assessment: Overall WFL for tasks assessed   Lower Extremity Assessment Lower Extremity Assessment: Defer to PT  evaluation        Vision   Vision Assessment?: No apparent visual deficits   Perception     Praxis      Cognition Arousal/Alertness: Awake/alert Behavior During Therapy: Flat affect, Impulsive Overall Cognitive Status: No family/caregiver  present to determine baseline cognitive functioning Area of Impairment: Safety/judgement, Problem solving, Awareness, Memory, Attention                   Current Attention Level: Selective Memory: Decreased short-term memory   Safety/Judgement: Decreased awareness of deficits, Decreased awareness of safety Awareness: Emergent Problem Solving: Slow processing, Requires verbal cues, Difficulty sequencing General Comments: impulsive at times but easily redirected. Pt with noted decreased awareness and problem solving - asked where deodorant is supposed to be applied. Does show some insight as pt stood without AD but indicated "i may better use the walker"        Exercises      Shoulder Instructions       General Comments      Pertinent Vitals/ Pain       Pain Assessment Pain Assessment: No/denies pain  Home Living                                          Prior Functioning/Environment              Frequency  Min 2X/week        Progress Toward Goals  OT Goals(current goals can now be found in the care plan section)  Progress towards OT goals: Progressing toward goals  Acute Rehab OT Goals Patient Stated Goal: walk more OT Goal Formulation: With patient Time For Goal Achievement: 12/16/21 Potential to Achieve Goals: Good ADL Goals Pt Will Transfer to Toilet: with modified independence;ambulating Additional ADL Goal #1: Pt to complete 3 step trail making task with no more than min verbal cues Additional ADL Goal #2: Pt to increase standing activity tolerance > 10 min during ADLs/mobility  Plan Discharge plan needs to be updated    Co-evaluation                 AM-PAC OT "6 Clicks" Daily Activity     Outcome Measure   Help from another person eating meals?: None Help from another person taking care of personal grooming?: A Little Help from another person toileting, which includes using toliet, bedpan, or urinal?: A  Little Help from another person bathing (including washing, rinsing, drying)?: A Little Help from another person to put on and taking off regular upper body clothing?: A Little Help from another person to put on and taking off regular lower body clothing?: A Little 6 Click Score: 19    End of Session Equipment Utilized During Treatment: Rolling walker (2 wheels);Oxygen  OT Visit Diagnosis: Unsteadiness on feet (R26.81)   Activity Tolerance Patient tolerated treatment well   Patient Left in bed;with call bell/phone within reach;with nursing/sitter in room   Nurse Communication Mobility status        Time: [id number] OT Time Calculation (min): 34 min  Charges: OT General Charges $OT Visit: 1 Visit OT Treatments $Self Care/Home Management : 23-37 mins  Malachy Chamber, OTR/L Acute Rehab Services Office: [phone]   Layla Maw 12/04/2021, 10:40 AM

## 2021-12-04 NOTE — Progress Notes (Signed)
Subjective: I seen and evaluated [name] at bedside.  He was sleeping but easily aroused.  He denies any pain at this time.  He denies any trouble breathing despite being on 3 L nasal cannula.  He is alert and oriented x2.  Objective:  Vital signs in last 24 hours: Vitals:   12/03/21 1612 12/03/21 2025 12/04/21 0416 12/04/21 0500  BP: 122/80 110/78 111/63   Pulse: 69 64 77   Resp: 16 19 18    Temp: 97.7 F (36.5 C) 98.1 F (36.7 C) (!) 97.5 F (36.4 C)   TempSrc: Oral Oral Oral   SpO2: 96% 98% 94%   Weight:    89.5 kg  Height:       Physical Exam Constitutional:      General: He is awake.     Interventions: Nasal cannula in place.  HENT:     Head: Normocephalic and atraumatic.  Cardiovascular:     Heart sounds: Normal heart sounds.  Pulmonary:     Effort: Pulmonary effort is normal.     Breath sounds: Rales present.  Abdominal:     General: Abdomen is protuberant. Bowel sounds are normal.     Palpations: Abdomen is soft.     Tenderness: There is no abdominal tenderness.  Musculoskeletal:     Right lower leg: No edema.     Left lower leg: No edema.  Skin:    General: Skin is warm and dry.  Neurological:     Mental Status: He is easily aroused.     Comments: Alert and oriented x2.  Answer questions appropriately.  Psychiatric:        Behavior: Behavior is cooperative.     Assessment/Plan:  Principal Problem:   Hypokalemia Active Problems:   Acute exacerbation of CHF (congestive heart failure) (Gleason)   Accident due to mechanical fall without injury   Sundowning  Mr. Wesam Kidney is a 76 year old M with a PMH of HFrEF (EF 35 to 40% 10/2021) 2/2 NICM, IE s/p mechanical mitral valve replacement on chronic AC with coumadin, atrial flutter/fibrillation, s/p CRT-P for bradycardia and fatigue, CKD IIIb, HTN, and BPH. Admitted for hypokalemia and acute on chronic systolic and diastolic HF with a new O2 requirement.   #Acute on chronic systolic and diastolic heart  failure  Patient was being diuresed intermittently since admission with IV Lasix 40 mg.  Since admission net output 1.3 L with over 9 unmeasured output.  Patient is still requiring oxygen, currently on 3 L nasal cannula.  On exam, rales appreciated at bilateral lung bases.  Chest x-ray obtained yesterday reveals pulmonary edema with bilateral pleural effusions.  Patient is still volume overloaded and will need consistent diuresis.  His creatinine has been around baseline, today Cr 2.01 (baseline ~1.5-2).  -Wean O2 as able  -Strict I/Os -Daily weights  - K> 4; Mg > 2 -IV Lasix 40 mg twice daily  -Cardiac telemetry  #Mechanical fall Patient experienced a mechanical fall in the setting of chronic anticoagulation. He denies any pain. CT head without contrast showed no evidence of bleeding. Orthostatics are WNL.  OT recommending home health OT. -Pending PT recommendations   #MR s/p MVR on coumadin  INR supratherapeutic on admission to 3.9 and continues to be supratherapeutic at 4.2 today. -Appreciate pharmacy's assistance   #Atrial fibrillation  Afib on admission. -Continue home coumadin and coreg -Cardiac telemetry   #Microcytic anemia  Iron studies are reflective of an iron deficient state; iron level of 19,  iron saturations < 5 - IV Ferrlecit 250 mg x4 doses   CKD3b  Baseline creatinine ~1.5-2.0 Stable.  -Trend BMP -Avoid nephrotoxic medications   HTN Normotensive.  -Continue home medication regimen   #Hypokalemia, resolved Patient presented with severe hypokalemia likely 2/2 loop diuretic use. Patient has been receiving K+ and Mg for repletion. -Trend BMP, Magnesium -Replete as needed with goal K >4 and Mg >2    Prior to Admission Living Arrangement: Anticipated Discharge Location: Barriers to Discharge: Dispo: Anticipated discharge in approximately 1-2 day(s).   Timothy Lasso, MD 12/04/2021, 7:57 AM Pager: [phone] After 5pm on weekdays and 1pm on weekends: On  Call pager [phone]

## 2021-12-04 NOTE — Progress Notes (Signed)
OT Cancellation Note  Patient Details Name: [name] MRN: [id number] DOB: 03/23/1946   Cancelled Treatment:    Reason Eval/Treat Not Completed: Other (comment) currently still working on breakfast tray. Will follow up this AM as schedule permits for OT session.  Lorre Munroe 12/04/2021, 9:18 AM

## 2021-12-05 LAB — MAGNESIUM: Magnesium: 1.9 mg/dL (ref 1.7–2.4)

## 2021-12-05 LAB — CBC
HCT: 31.3 % — ABNORMAL LOW (ref 39.0–52.0)
Hemoglobin: 8.7 g/dL — ABNORMAL LOW (ref 13.0–17.0)
MCH: 20.5 pg — ABNORMAL LOW (ref 26.0–34.0)
MCHC: 27.8 g/dL — ABNORMAL LOW (ref 30.0–36.0)
MCV: 73.8 fL — ABNORMAL LOW (ref 80.0–100.0)
Platelets: 403 10*3/uL — ABNORMAL HIGH (ref 150–400)
RBC: 4.24 MIL/uL (ref 4.22–5.81)
RDW: 21.1 % — ABNORMAL HIGH (ref 11.5–15.5)
WBC: 8.2 10*3/uL (ref 4.0–10.5)
nRBC: 4.3 % — ABNORMAL HIGH (ref 0.0–0.2)

## 2021-12-05 LAB — BASIC METABOLIC PANEL
Anion gap: 9 (ref 5–15)
BUN: 33 mg/dL — ABNORMAL HIGH (ref 8–23)
CO2: 24 mmol/L (ref 22–32)
Calcium: 9.4 mg/dL (ref 8.9–10.3)
Chloride: 106 mmol/L (ref 98–111)
Creatinine, Ser: 2.24 mg/dL — ABNORMAL HIGH (ref 0.61–1.24)
GFR, Estimated: 29 mL/min — ABNORMAL LOW (ref >60)
Glucose, Bld: 104 mg/dL — ABNORMAL HIGH (ref 70–99)
Potassium: 4.2 mmol/L (ref 3.5–5.1)
Sodium: 139 mmol/L (ref 135–145)

## 2021-12-05 LAB — PROTIME-INR
INR: 3.7 — ABNORMAL HIGH (ref 0.8–1.2)
Prothrombin Time: 36.7 seconds — ABNORMAL HIGH (ref 11.4–15.2)

## 2021-12-05 MED ORDER — WARFARIN SODIUM 5 MG PO TABS: 2.5000 mg | ORAL_TABLET | Freq: Every day | ORAL | Status: DC

## 2021-12-05 MED ORDER — POLYETHYLENE GLYCOL 3350 17 G PO PACK: 17.0000 g | PACK | Freq: Every day | ORAL | 0 refills | Status: AC | PRN

## 2021-12-05 MED ORDER — MAGNESIUM SULFATE 2 GM/50ML IV SOLN
2.0000 g | Freq: Once | INTRAVENOUS | Status: AC
  Administered 2021-12-05: 2 g via INTRAVENOUS
  Filled 2021-12-05: qty 50

## 2021-12-05 MED ORDER — WARFARIN 0.5 MG HALF TABLET
0.5000 mg | ORAL_TABLET | Freq: Once | ORAL | Status: AC
  Administered 2021-12-05: 0.5 mg via ORAL
  Filled 2021-12-05: qty 1

## 2021-12-05 MED ORDER — TORSEMIDE 20 MG PO TABS
20.0000 mg | ORAL_TABLET | Freq: Two times a day (BID) | ORAL | Status: DC
  Administered 2021-12-05: 20 mg via ORAL
  Filled 2021-12-05: qty 1

## 2021-12-05 MED ORDER — TORSEMIDE 20 MG PO TABS: 20.0000 mg | ORAL_TABLET | Freq: Two times a day (BID) | ORAL | 0 refills | Status: AC

## 2021-12-05 MED ORDER — FERROUS SULFATE 325 (65 FE) MG PO TABS
325.0000 mg | ORAL_TABLET | Freq: Every day | ORAL | 0 refills | Status: DC
Start: 2021-12-05 — End: 2022-03-02

## 2021-12-05 NOTE — TOC Progression Note (Signed)
Transition of Care [facility]) - Progression Note    Patient Details  Name: [name] MRN: [id number] Date of Birth: 04/28/1946  Transition of Care [facility]) CM/SW Contact  Nadene Rubins Adria Devon, RN Phone Number: 12/05/2021, 9:40 AM  Clinical Narrative:     [facility] can accept for HHPT and HHOT .   Messaged MD for orders   Expected Discharge Plan: Home/Self Care Barriers to Discharge: Continued Medical Work up  Expected Discharge Plan and Services Expected Discharge Plan: Home/Self Care   Discharge Planning Services: CM Consult   Living arrangements for the past 2 months: Single Family Home                 DME Arranged: Oxygen DME Agency: [employer] Date DME Agency Contacted: 12/03/21 Time DME Agency Contacted: [id number] Representative spoke with at DME Agency: Vaughan Basta             Social Determinants of Health (SDOH) Interventions    Readmission Risk Interventions     View : No data to display.

## 2021-12-05 NOTE — TOC Progression Note (Addendum)
Transition of Care [facility]) - Progression Note    Patient Details  Name: CURLEY HOGEN MRN: [id number] Date of Birth: February 17, 1946  Transition of Care [facility]) CM/SW Contact  Amijah Timothy, Adria Devon, RN Phone Number: 12/05/2021, 11:59 AM  Clinical Narrative:      Patient no longer needs home oxygen, Jermaine with [employer] aware and will pick up oxygen DME   Kandee Keen with [facility] aware discharge is today. Asked MD for orders for HHPT and face to face  Expected Discharge Plan: Home/Self Care Barriers to Discharge: Continued Medical Work up  Expected Discharge Plan and Services Expected Discharge Plan: Home/Self Care   Discharge Planning Services: CM Consult   Living arrangements for the past 2 months: Single Family Home                 DME Arranged: Oxygen DME Agency: [employer] Date DME Agency Contacted: 12/03/21 Time DME Agency Contacted: 1606 Representative spoke with at DME Agency: Vaughan Basta             Social Determinants of Health (SDOH) Interventions    Readmission Risk Interventions     View : No data to display.

## 2021-12-05 NOTE — Progress Notes (Signed)
Physical Therapy Treatment Patient Details Name: [name] MRN: [id number] DOB: 22-Jun-1946 Today's Date: 12/05/2021   History of Present Illness Pt is a 76 y/o male who presented with AMS, hypokalemia and s/p mechanical fall. CT negative for acute fx. PMH: CHF, mitral valve replacement, a fib,  s/p CRT-P for bradycardia, CKD, HTN, BPH.    PT Comments    Pt with increased activity tolerance this session. No complains of SOB during session. He was able to ambulate 369ft with RW and supervision and negotiate 6 steps with min guard. Pts 02 sats were 97% on RA upon return to the room. Pt continues to demonstrated mild cognitive deficits that required supervision for safety during mobility. Pt would benefit from frequent/constant supervision upon discharge due to cognitive deficits and decreased safety awareness. HHPT is still the most appropriate discharge disposition due to decreased functional activity tolerance and balance deficits.    Recommendations for follow up therapy are one component of a multi-disciplinary discharge planning process, led by the attending physician.  Recommendations may be updated based on patient status, additional functional criteria and insurance authorization.  Follow Up Recommendations  Home health PT     Assistance Recommended at Discharge Frequent or constant Supervision/Assistance  Patient can return home with the following Assistance with cooking/housework;Direct supervision/assist for medications management;Direct supervision/assist for financial management;Assist for transportation;Help with stairs or ramp for entrance   Equipment Recommendations  None recommended by PT    Recommendations for Other Services       Precautions / Restrictions Precautions Precautions: Fall Precaution Comments: monitor O2 (does not wear at baseline) Restrictions Weight Bearing Restrictions: No     Mobility  Bed Mobility               General bed mobility  comments: In recliner upon arrival    Transfers Overall transfer level: Needs assistance Equipment used: Rolling walker (2 wheels), None Transfers: Sit to/from Stand Sit to Stand: Supervision           General transfer comment: from recliner, supervision for safety    Ambulation/Gait Ambulation/Gait assistance: Supervision Gait Distance (Feet): 300 Feet Assistive device: Rolling walker (2 wheels) Gait Pattern/deviations: Trunk flexed, Decreased stride length Gait velocity: decreased Gait velocity interpretation: 1.31 - 2.62 ft/sec, indicative of limited community ambulator   General Gait Details: verbal cues to keep RW close and use for safety when navigating tight spaces as well as when turning to sit   Stairs Stairs: Yes Stairs assistance: Min guard Stair Management: One rail Left, Step to pattern Number of Stairs: 6 General stair comments: demonstrated good technique, denied SOB, limited to 6 stairs due to IV   Wheelchair Mobility    Modified Rankin (Stroke Patients Only)       Balance Overall balance assessment: Mild deficits observed, not formally tested                                          Cognition Arousal/Alertness: Awake/alert Behavior During Therapy: Flat affect Overall Cognitive Status: No family/caregiver present to determine baseline cognitive functioning Area of Impairment: Safety/judgement, Awareness, Problem solving, Following commands                       Following Commands: Follows one step commands with increased time Safety/Judgement: Decreased awareness of deficits, Decreased awareness of safety Awareness: Intellectual Problem Solving: Slow processing, Difficulty sequencing,  Requires verbal cues General Comments: increased time to follow commands, unable to complete dual cog task of reciting months backwards while ambulating.        Exercises      General Comments        Pertinent Vitals/Pain  Pain Assessment Pain Assessment: No/denies pain    Home Living                          Prior Function            PT Goals (current goals can now be found in the care plan section) Acute Rehab PT Goals Patient Stated Goal: to return home PT Goal Formulation: With patient Time For Goal Achievement: 12/16/21 Potential to Achieve Goals: Good Progress towards PT goals: Progressing toward goals    Frequency    Min 3X/week      PT Plan Current plan remains appropriate    Co-evaluation              AM-PAC PT "6 Clicks" Mobility   Outcome Measure  Help needed turning from your back to your side while in a flat bed without using bedrails?: None Help needed moving from lying on your back to sitting on the side of a flat bed without using bedrails?: None Help needed moving to and from a bed to a chair (including a wheelchair)?: A Little Help needed standing up from a chair using your arms (e.g., wheelchair or bedside chair)?: A Little Help needed to walk in hospital room?: A Little Help needed climbing 3-5 steps with a railing? : A Little 6 Click Score: 20    End of Session Equipment Utilized During Treatment: Gait belt Activity Tolerance: Patient tolerated treatment well Patient left: in chair;with call bell/phone within reach;with nursing/sitter in room Nurse Communication: Mobility status PT Visit Diagnosis: Unsteadiness on feet (R26.81);Difficulty in walking, not elsewhere classified (R26.2)     Time: 1100-1130 PT Time Calculation (min) (ACUTE ONLY): 30 min  Charges:  $Gait Training: 23-37 mins                     Mackie Pai, SPT Acute Rehabilitation Services  Office: [place] 12/05/2021, 12:01 PM

## 2021-12-05 NOTE — Progress Notes (Signed)
[place] for Warfarin Indication:  History of mechanical MVR  No Known Allergies  Patient Measurements: Height: 5\' 8"  (172.7 cm) Weight: (P) 90.9 kg (200 lb 6.4 oz) IBW/kg (Calculated) : 68.4  Vital Signs: Pulse Rate: 78 (06/02 1053)  Labs: Recent Labs    12/03/21 0625 12/04/21 0652 12/05/21 0647  HGB 9.2* 8.6* 8.7*  HCT 33.6* 30.3* 31.3*  PLT 425* 388 403*  LABPROT 36.5* 40.3* 36.7*  INR 3.7* 4.2* 3.7*  CREATININE 2.01* 2.05* 2.24*     Estimated Creatinine Clearance: 29.5 mL/min (A) (by C-G formula based on SCr of 2.24 mg/dL (H)).   Assessment: 43 yom with a history of anemia, endocarditis s/p MVR, GIB presenting with fall. Warfarin per pharmacy consult placed for history of mechanical mitral valve replacement.  INR slightly elevated but trending down; no bleeding reported.  Eating better.  Home regimen:  5mg  PO daily except 2.5mg  on Sat and Sun  Goal of Therapy:  INR 2.5-3.5 Monitor platelets by anticoagulation protocol: Yes   Plan:  Coumadin 0.5mg  PO today Daily PT / INR  Bogdan Vivona D. Mina Marble, PharmD, BCPS, [place] 12/05/2021, 11:27 AM

## 2021-12-05 NOTE — Discharge Summary (Addendum)
Name: [name] MRN: [id number] DOB: 1945/11/03 76 y.o. PCP: [name], MD  Date of Admission: 12/01/2021 11:11 AM Date of Discharge: 12/05/21 Attending Physician: Dr. Evette Doffing  Discharge Diagnosis: Principal Problem:   Hypokalemia Active Problems:   Acute exacerbation of CHF (congestive heart failure) ([place])   Accident due to mechanical fall without injury   Sundowning Hypokalemia MR s/p MVP on Coumadin Atrial fibrillation Microcytic anemia CKD stage III Hypertension  Discharge Medications: Allergies as of 12/05/2021   No Known Allergies      Medication List     STOP taking these medications    diclofenac Sodium 1 % Gel Commonly known as: VOLTAREN   traMADol 50 MG tablet Commonly known as: ULTRAM       TAKE these medications    acetaminophen 500 MG tablet Commonly known as: TYLENOL Take 1,000 mg by mouth every 6 (six) hours as needed (for pain).   atorvastatin 20 MG tablet Commonly known as: LIPITOR Take 1 tablet (20 mg total) by mouth every evening. What changed:  when to take this additional instructions   carvedilol 6.25 MG tablet Commonly known as: COREG Take 1 tablet (6.25 mg total) by mouth 2 (two) times daily with a meal.   ferrous sulfate 325 (65 FE) MG tablet Take 1 tablet (325 mg total) by mouth daily.   pantoprazole 40 MG tablet Commonly known as: PROTONIX TAKE 1 TABLET (40 MG TOTAL) BY MOUTH TWICE A DAY BEFORE MEALS What changed: See the new instructions.   polyethylene glycol 17 g packet Commonly known as: MiraLax Take 17 g by mouth daily as needed. Take as needed, no more than twice daily if experiencing constipation   tamsulosin 0.4 MG Caps capsule Commonly known as: FLOMAX Take 2 capsules (0.8 mg total) by mouth daily after breakfast.   torsemide 20 MG tablet Commonly known as: DEMADEX Take 1 tablet (20 mg total) by mouth 2 (two) times daily. Start taking on: December 06, 2021   warfarin 5 MG tablet Commonly known as:  Coumadin Take as directed. If you are unsure how to take this medication, talk to your nurse or doctor. Original instructions: Take 0.5 tablets (2.5 mg total) by mouth daily at 8 pm. Take 1/2 tablet of warfarin 5 mg tab on Saturday and Sunday. Follow-up in [facility] on Monday 12/08/2021 with Dr. Elie Confer. What changed:  how much to take how to take this when to take this additional instructions        Disposition and follow-up:   [name] was discharged from [facility] in Stable condition.  At the hospital follow up visit please address:  1.  Follow-up:  A. Obtain BMP and Mg to assess electrolyte status; home medication torsemide has been resumed at discharge and pt was noted to be hypokalemic on admission, which was resolved at time of discharge.  B. INR supratherapeutic 3.7 on discharge. Patient is instructed to take 1/2 of 5 mg Warfarin tablet on 6/3 and 6/4. He will follow up with INR clinic on 6/5 for recheck INR.   2.  Labs / imaging needed at time of follow-up: BMP, Mg  3.  Pending labs/ test needing follow-up: None  Follow-up Appointments:  Follow-up Information     [facility] [place] Follow up.   Why: for home health services Contact information: [place] [phone]         [name], MD. Call in 3 day(s).  Specialty: Internal Medicine Contact information: [address] [place] [address] [phone]         [name], MD .   Specialty: Cardiology Contact information: [phone] [place] [address] [phone]                 Hospital Course by problem list:  #Acute on chronic systolic and diastolic heart failure  Patient noted to have persistent lower extremity edema at home despite increased doses of his torsemide in the outpatient setting though because of his cognitive impairment may be prohibiting  his medication adherence. Almost presented with a new O2 requirement of 2 L. Received several doses of IV Lasix 40 mg while in the hospital. Weighed 90kg at discharge with a total net output of 2L (over 9 unmeasured output). Currently NYHA class II symptoms. Will continue with carvedilol, can not tolerate ARB/ARNI/MRA due to CKD. Will have close follow up in the [facility] for volume assessment next week.  #Hypokalemia, resolved Patient with severe hypokalemia likely 2/2 loop diuretic use. S/p IV and PO potassium repletion with normalization of his K levels.  #Acute metabolic encephalopathy For two nights during hospitalization, patient was noted to become agitated and disoriented. He was give Seroquel for safety along with a sitter. Encephalopathy improved with supportive care, likely hospital delirium.   #Mechanical fall Patient experienced a mechanical fall in the setting of chronic anticoagulation. He denies any pain. CT head without contrast showed no evidence of bleeding. Orthostatics are WNL. Vit B12 and folate also WNL. PT and OT evaluated the pt and recommend HHPT/OT. Family is agreeable to home health.   #MR s/p MVR on coumadin  INR supratherapeutic on admission to 3.9. Trends of INR shows improvement with Pharmacy assistance of medication dosing.   #Atrial fibrillation  Afib on admission. Cardiac telemetry throughout hospitalization, captured intermittent a. Flutter and a.fib. Patient was asymptomatic. Coumadin and coreg continued throughout hospitalization.  #Microcytic anemia  Iron studies are reflective of an iron deficient state. Consider EGD +/- colonoscopy as outpatient. Patient received IV iron supplementation x4 doses during hospitalization.  #CKD3b BL ~1.5-2.0.  Stable in the hospital. Trended BMP and avoided nephrotoxic meds.  #HTN Continued on home medication regimen.    Discharge Subjective: Patient was alert&oriented x2 and denies any concerns this time. He was states his  breathing has improved, no longer needing oxygen at rest.  Discharge Exam:   BP 112/75 (BP Location: Right Arm)   Pulse 78   Temp 98 F (36.7 C) (Oral)   Resp 16   Ht 5\' 8"  (1.727 m)   Wt (P) 90.9 kg   SpO2 99%   BMI (P) 30.47 kg/m  Constitutional: normal-appearing elderly gentleman sitting at the edge of the bed, in no acute distress HENT: normocephalic atraumatic, mucous membranes moist Eyes: conjunctiva non-erythematous Neck: supple Cardiovascular: regular rate and rhythm, no m/r/g; no lower extremity edema Pulmonary/Chest: normal work of breathing on room air, decreased breath sounds, no wheezing/rales/rhonchi Abdominal: soft, non-tender, non-distended MSK: normal bulk and tone Neurological: alert & oriented x 2, Skin: warm and dry Psych: Normal behavior   Pertinent Labs, Studies, and Procedures:     Latest Ref Rng & Units 12/05/2021    6:47 AM 12/04/2021    6:52 AM 12/03/2021    6:25 AM  CBC  WBC 4.0 - 10.5 K/uL 8.2   6.8   7.9    Hemoglobin 13.0 - 17.0 g/dL 8.7   8.6  9.2    Hematocrit 39.0 - 52.0 % 31.3   30.3   33.6    Platelets 150 - 400 K/uL 403   388   425         Latest Ref Rng & Units 12/05/2021    6:47 AM 12/04/2021    6:52 AM 12/03/2021    6:25 AM  CMP  Glucose 70 - 99 mg/dL 104   87   116    BUN 8 - 23 mg/dL 33   34   29    Creatinine 0.61 - 1.24 mg/dL 2.24   2.05   2.01    Sodium 135 - 145 mmol/L 139   139   139    Potassium 3.5 - 5.1 mmol/L 4.2   3.7   3.8    Chloride 98 - 111 mmol/L 106   107   103    CO2 22 - 32 mmol/L 24   23   26     Calcium 8.9 - 10.3 mg/dL 9.4   9.4   9.4      CT Head Wo Contrast  Result Date: 12/01/2021 CLINICAL DATA:  Head trauma, moderate-severe EXAM: CT HEAD WITHOUT CONTRAST TECHNIQUE: Contiguous axial images were obtained from the base of the skull through the vertex without intravenous contrast. RADIATION DOSE REDUCTION: This exam was performed according to the departmental dose-optimization program which includes automated  exposure control, adjustment of the mA and/or kV according to patient size and/or use of iterative reconstruction technique. COMPARISON:  09/15/2021 FINDINGS: Brain: No evidence of acute infarction, hemorrhage, hydrocephalus, extra-axial collection or mass lesion/mass effect. Scattered low-density changes within the periventricular and subcortical white matter compatible with chronic microvascular ischemic change. Mild diffuse cerebral volume loss. Vascular: Bilateral superior ophthalmic veins are dilated and tortuous. No unexpected calcification or hyperdense vessel seen. Skull: Normal. Negative for fracture or focal lesion. Sinuses/Orbits: No acute finding. Other: None. IMPRESSION: 1. No acute intracranial findings. 2. Bilateral superior ophthalmic veins are dilated and tortuous. Findings are nonspecific and can be seen in the setting of idiopathic intracranial hypertension. Electronically Signed   By: Davina Poke D.O.   On: 12/01/2021 12:30   DG Chest Portable 1 View  Result Date: 12/01/2021 CLINICAL DATA:  [place] last night. EXAM: PORTABLE CHEST 1 VIEW COMPARISON:  10/25/2021 FINDINGS: Poor inspiration. Interval extensive patchy opacities in both lower lung zones, obscuring the heart borders. No significant change in mild underlying interstitial prominence. No gross change in enlargement of the cardiac silhouette and prosthetic mitral valve. Stable left subclavian bipolar pacemaker leads. Elevated left hemidiaphragm. Thoracic spine degenerative changes. No visible fractures and no pneumothorax. IMPRESSION: 1. Poor inspiration with interval extensive patchy opacities in both lower lung zones. Differential considerations include pneumonia and alveolar edema. 2. Grossly stable cardiomegaly and mild chronic interstitial lung disease. Electronically Signed   By: Claudie Revering M.D.   On: 12/01/2021 15:36     Discharge Instructions: Discharge Instructions     Call MD for:  difficulty breathing, headache or  visual disturbances   Complete by: As directed    Call MD for:  extreme fatigue   Complete by: As directed    Call MD for:  hives   Complete by: As directed    Call MD for:  persistant dizziness or light-headedness   Complete by: As directed    Call MD for:  persistant nausea and vomiting   Complete by: As directed    Call MD for:  redness, tenderness, or signs  of infection (pain, swelling, redness, odor or green/yellow discharge around incision site)   Complete by: As directed    Call MD for:  severe uncontrolled pain   Complete by: As directed    Call MD for:  temperature >100.4   Complete by: As directed    Diet - low sodium heart healthy   Complete by: As directed    Face-to-face encounter (required for Medicare/Medicaid patients)   Complete by: As directed    I Timothy Lasso certify that this patient is under my care and that I, or a nurse practitioner or physician's assistant working with me, had a face-to-face encounter that meets the physician face-to-face encounter requirements with this patient on 12/05/2021. The encounter with the patient was in whole, or in part for the following medical condition(s) which is the primary reason for home health care (List medical condition): physical deconditioning.   The encounter with the patient was in whole, or in part, for the following medical condition, which is the primary reason for home health care: Physical deconditioning   I certify that, based on my findings, the following services are medically necessary home health services: Physical therapy   Reason for Medically Necessary Home Health Services: Therapy- Therapeutic Exercises to Increase Strength and Endurance   My clinical findings support the need for the above services: Unsafe ambulation due to balance issues   Further, I certify that my clinical findings support that this patient is homebound due to: Unsafe ambulation due to balance issues   Home Health   Complete by: As  directed    To provide the following care/treatments:  PT OT     Increase activity slowly   Complete by: As directed        Signed: Timothy Lasso, MD Internal Medicine Resident Pager: [phone]

## 2021-12-05 NOTE — Progress Notes (Signed)
[name] and daughter to be D/C'd  per MD order.  Discussed with the patient and all questions fully answered.  VSS, Skin clean, dry and intact without evidence of skin break down, no evidence of skin tears noted.  IV catheter discontinued intact. Site without signs and symptoms of complications. Dressing and pressure applied.  An After Visit Summary was printed and given to the patient.  D/c re-educated completed with patient/family including follow up instructions, medication list, d/c activities limitations if indicated, with other d/c instructions as indicated by MD - patient able to verbalize understanding, all questions fully answered.   Patient instructed to return to ED, call 911, or call MD for any changes in condition.   Patient to be escorted via WC, and D/C home via private auto.

## 2021-12-05 NOTE — Care Management Important Message (Signed)
Important Message  Patient Details  Name: [name] MRN: [id number] Date of Birth: August 27, 1945   Medicare Important Message Given:  Yes     Orbie Pyo 12/05/2021, 3:03 PM

## 2021-12-05 NOTE — Discharge Instructions (Addendum)
Please take your iron supplement pill, once daily. You may experience constipation while taking your iron pills. Take Miralax daily as needed, if you are constipated. Use Miralax no more than twice daily for constipation.  For warfarin, please only take 1/2 tablet of your Warfarin on Saturday and Sunday.  We will make an appointment for INR clinic on Monday morning.  Please follow up with your cardiologist as soon as possible.

## 2021-12-05 NOTE — Care Management Important Message (Signed)
Important Message  Patient Details  Name: [name] MRN: [id number] Date of Birth: April 27, 1946   Medicare Important Message Given:  Yes     Dorena Bodo 12/05/2021, 9:25 AM

## 2021-12-08 ENCOUNTER — Ambulatory Visit (INDEPENDENT_AMBULATORY_CARE_PROVIDER_SITE_OTHER): Payer: 59 | Admitting: Podiatry

## 2021-12-08 DIAGNOSIS — M79674 Pain in right toe(s): Secondary | ICD-10-CM | POA: Diagnosis not present

## 2021-12-08 DIAGNOSIS — B351 Tinea unguium: Secondary | ICD-10-CM

## 2021-12-08 DIAGNOSIS — M79675 Pain in left toe(s): Secondary | ICD-10-CM

## 2021-12-08 LAB — PATHOLOGIST SMEAR REVIEW

## 2021-12-08 NOTE — Progress Notes (Signed)
   SUBJECTIVE Patient presents to office today complaining of elongated, thickened nails that cause pain while ambulating in shoes.  Patient is unable to trim their own nails. Patient is here for further evaluation and treatment.  Past Medical History:  Diagnosis Date   Acute GI bleeding 06/27/2021   Anemia 01/29/2016   Atrial fibrillation ([place])    post op.  s/p dc-cv 04/2008. 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  a- Echo 07/2008 Ef 55% mild peroprosthetic MVR with High transmitral gradient (mean 14).  b- normal coronaries by cath 12/2007   BPH (benign prostatic hyperplasia)    Cardiac resynchronization therapy pacemaker (CRT-P) in place 06/22/2011   CHF (congestive heart failure) ([facility])    EF 35-40 % 2011 due to valvular disease and diastolic dysfunction   Chronic back pain    CKD (chronic kidney disease), stage III ([place])    COVID-19 virus infection 07/24/2020   Tested positive on July 16, 2020   Dysphagia    with normal barium swallow 09/2008   Endocarditis    GERD (gastroesophageal reflux disease)    Heavy alcohol use    history   History of atrial flutter 03/13/2008   History of atrial flutter - resolved.    History of GI bleed    Hypertension    IV drug abuse ([place])    history of   Lower GI bleed 01/2016   Memory impairment 12/24/2015   Pacemaker 2010    OBJECTIVE General Patient is awake, alert, and oriented x 3 and in no acute distress. Derm Skin is dry and supple bilateral. Negative open lesions or macerations. Remaining integument unremarkable. Nails are tender, long, thickened and dystrophic with subungual debris, consistent with onychomycosis, 1-5 bilateral. No signs of infection noted. Vasc  DP and PT pedal pulses palpable bilaterally. Temperature gradient within normal limits.  Neuro Epicritic and protective threshold  sensation grossly intact bilaterally.  Musculoskeletal Exam No symptomatic pedal deformities noted bilateral. Muscular strength within normal limits.  ASSESSMENT 1.  Pain due to onychomycosis of toenails both  PLAN OF CARE 1. Patient evaluated today.  2. Instructed to maintain good pedal hygiene and foot care.  3. Mechanical debridement of nails 1-5 bilaterally performed using a nail nipper. Filed with dremel without incident.  4. Return to clinic in 3 mos.    Edrick Kins, DPM [facility]  Dr. Edrick Kins, DPM    [address], [place] [address]                Office [phone]  Fax [phone]

## 2021-12-09 ENCOUNTER — Telehealth: Payer: Self-pay | Admitting: Internal Medicine

## 2021-12-09 NOTE — Telephone Encounter (Signed)
-----   Message from Elicia Lamp, RPH-CPP sent at 12/09/2021  9:33 AM EDT ----- I can see him any remaining day of this week. Please contact the patient and/or the friend who now brings him to clinic to see when they can bring him. Upon his arrival,  call me and I will come to the clinic to see him. ----- Message ----- From: Lianne Bushy Sent: 12/09/2021   9:10 AM EDT To: Elicia Lamp, RPH-CPP, Doran Stabler, DO  I have CC'd this message to Dr. [name] to see if he has any availability this week to see this pt? ----- Message ----- From: Doran Stabler, DO Sent: 12/08/2021   2:12 PM EDT To: Lianne Bushy  Can we please schedule him with Dr. [name] as early as possible? ----- Message ----- From: Lianne Bushy Sent: 12/08/2021   2:07 PM EDT To: Doran Stabler, DO  Just getting to your msg.  He can he wait until the following Monday to see Dr. [name] and a doctor.  Just let me know.  Thanks,  [place] ----- Message ----- From: Doran Stabler, DO Sent: 12/05/2021  12:35 PM EDT To: [employer],  Sorry, I think I sent you a message earlier.  Can we please make an appointment for this patient on Monday 6/5 for hospital follow up and INR check?  Thank you,  Cloretta Ned

## 2021-12-09 NOTE — Telephone Encounter (Signed)
Spoke with [name], patient's contact and transportation.  Agreed to have patient come in tomorrow 12/10/2021 at 10:00 am to have his coumadin check.  Forwarding information to Dr. Doran Stabler and Dr. Hulen Luster.

## 2021-12-10 ENCOUNTER — Ambulatory Visit (INDEPENDENT_AMBULATORY_CARE_PROVIDER_SITE_OTHER): Payer: 59 | Admitting: Pharmacist

## 2021-12-10 DIAGNOSIS — Z952 Presence of prosthetic heart valve: Secondary | ICD-10-CM

## 2021-12-10 DIAGNOSIS — Z8679 Personal history of other diseases of the circulatory system: Secondary | ICD-10-CM | POA: Diagnosis not present

## 2021-12-10 LAB — POCT INR: INR: 2.1 (ref 2.0–3.0)

## 2021-12-10 NOTE — Patient Instructions (Signed)
Patient instructed to take medications as defined in the Anti-coagulation Track section of this encounter.  Patient instructed to take today's dose.  Patient instructed to take one (1) tablet of your 5mg  peach-colored warfarin tablets by mouth, each day. Patient verbalized understanding of these instructions.

## 2021-12-10 NOTE — Progress Notes (Signed)
Anticoagulation Management [name] is a 76 y.o. male who reports to the clinic for monitoring of warfarin treatment.    Indication: atrial fibrillation; history of mitral valve replacement with mechanical prosthetic valve; INR target range 2.5 - 3.5. Duration: indefinite Supervising physician: Lalla Brothers  Anticoagulation Clinic Visit History: Patient does not report signs/symptoms of bleeding or thromboembolism  Other recent changes: No diet, medications, lifestyle changes except as noted in patient findings, which see. Anticoagulation Episode Summary     Current INR goal:  2.5-3.5  TTR:  60.2 % (9.2 y)  Next INR check:  12/22/2021  INR from last check:  2.1 (12/10/2021)  Weekly max warfarin dose:    Target end date:  Indefinite  INR check location:  Anticoagulation Clinic  Preferred lab:    Send INR reminders to:  ANTICOAG IMP   Indications   History of atrial flutter [Z86.79] Long term (current) use of anticoagulants (Resolved) [Z79.01] ATRIAL FLUTTER (Resolved) [I48.92] H/O mitral valve replacement with mechanical valve [Z95.2]        Comments:          Anticoagulation Care Providers     Provider Role Specialty Phone number   Jolaine Artist, MD  Cardiology [phone]       No Known Allergies  Current Outpatient Medications:    acetaminophen (TYLENOL) 500 MG tablet, Take 1,000 mg by mouth every 6 (six) hours as needed (for pain)., Disp: , Rfl:    atorvastatin (LIPITOR) 20 MG tablet, Take 1 tablet (20 mg total) by mouth every evening. (Patient taking differently: Take 20 mg by mouth See admin instructions. Take 20 mg by mouth at 4 PM every day), Disp: 30 tablet, Rfl: 2   carvedilol (COREG) 6.25 MG tablet, Take 1 tablet (6.25 mg total) by mouth 2 (two) times daily with a meal., Disp: 180 tablet, Rfl: 3   ferrous sulfate 325 (65 FE) MG tablet, Take 1 tablet (325 mg total) by mouth daily., Disp: 90 tablet, Rfl: 0   pantoprazole (PROTONIX) 40 MG tablet,  TAKE 1 TABLET (40 MG TOTAL) BY MOUTH TWICE A DAY BEFORE MEALS (Patient taking differently: Take 40 mg by mouth 2 (two) times daily before a meal.), Disp: 180 tablet, Rfl: 1   polyethylene glycol (MIRALAX) 17 g packet, Take 17 g by mouth daily as needed. Take as needed, no more than twice daily if experiencing constipation, Disp: 14 each, Rfl: 0   tamsulosin (FLOMAX) 0.4 MG CAPS capsule, Take 2 capsules (0.8 mg total) by mouth daily after breakfast., Disp: 180 capsule, Rfl: 3   torsemide (DEMADEX) 20 MG tablet, Take 1 tablet (20 mg total) by mouth 2 (two) times daily., Disp: 180 tablet, Rfl: 0   warfarin (COUMADIN) 5 MG tablet, Take 0.5 tablets (2.5 mg total) by mouth daily at 8 pm. Take 1/2 tablet of warfarin 5 mg tab on Saturday and Sunday. Follow-up in [facility] on Monday 12/08/2021 with Dr. Elie Confer., Disp: , Rfl:  Past Medical History:  Diagnosis Date   Acute GI bleeding 06/27/2021   Anemia 01/29/2016   Atrial fibrillation ([place])    post op.  s/p dc-cv 04/2008. 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  a- Echo 07/2008 Ef 55% mild peroprosthetic MVR with High transmitral gradient (mean 14).  b- normal coronaries  by cath 12/2007   BPH (benign prostatic hyperplasia)    Cardiac resynchronization therapy pacemaker (CRT-P) in place 06/22/2011   CHF (congestive heart failure) (HCC)    EF 35-40 % 2011 due to valvular disease and diastolic dysfunction   Chronic back pain    CKD (chronic kidney disease), stage III ([place])    COVID-19 virus infection 07/24/2020   Tested positive on July 16, 2020   Dysphagia    with normal barium swallow 09/2008   Endocarditis    GERD (gastroesophageal reflux disease)    Heavy alcohol use    history   History of atrial flutter 03/13/2008   History of atrial flutter - resolved.    History of GI bleed     Hypertension    IV drug abuse ([place])    history of   Lower GI bleed 01/2016   Memory impairment 12/24/2015   Pacemaker 2010   Social History   Socioeconomic History   Marital status: Divorced    Spouse name: Not on file   Number of children: 9   Years of education: Not on file   Highest education level: Not on file  Occupational History   Occupation: retired    Comment: [employer]  Tobacco Use   Smoking status: Former    Years: 0.00    Types: Cigarettes   Smokeless tobacco: Never  Vaping Use   Vaping Use: Never used  Substance and Sexual Activity   Alcohol use: No    Alcohol/week: 0.0 standard drinks   Drug use: No   Sexual activity: Not Currently  Other Topics Concern   Not on file  Social History Narrative   Current Social History 02/16/2019        Patient lives with family (baby's 103, daughter and 4 grandkids:  52 yo twins, 64 yo and 85 yo in a home which is 1 story. There are 4 steps with handrails up to the entrance the patient uses.       Patient's method of transportation is via family member (baby's mama).      The highest level of education was high school diploma.      The patient currently retired.      Identified important Relationships are "My 39 Joselyn Arrow, [name]."       Pets : None       Interests / Fun: Sitting outdoors watching the birds and planes."       Exercise riding a bike 5 miles 3 days/week      Current Stressors: The grandkids       Religious / Personal Beliefs: "I believe in God."       L. Ducatte, RN, BSN       Social Determinants of Health   Financial Resource Strain: Low Risk    Difficulty of Paying Living Expenses: Not very hard  Food Insecurity: No Food Insecurity   Worried About Charity fundraiser in the Last Year: Never true   Arboriculturist in the Last Year: Never true  Transportation Needs: No Transportation Needs   Lack of Transportation (Medical): No   Lack of Transportation (Non-Medical): No  Physical  Activity: Sufficiently Active   Days of Exercise per Week: 3 days   Minutes of Exercise per Session: 50 min  Stress: Not on file  Social Connections: Unknown   Frequency of Communication with Friends and Family: More than three times a week   Frequency of Social Gatherings with Friends and Family:  More than three times a week   Attends Religious Services: Not on file   Active Member of Clubs or Organizations: No   Attends Archivist Meetings: Never   Marital Status: Not on file   Family History  Problem Relation Age of Onset   Coronary artery disease Mother    Cancer Father        GI   Cancer Brother        Lung    ASSESSMENT Recent Results: The most recent result is correlated with 30 mg per week: Lab Results  Component Value Date   INR 2.1 12/10/2021   INR 3.7 (H) 12/05/2021   INR 4.2 (HH) 12/04/2021    Anticoagulation Dosing: Description   Take one (1) tablet by mouth, each day.       INR today: Subtherapeutic  PLAN Weekly dose was increased by 16% to 35 mg per week  Patient Instructions  Patient instructed to take medications as defined in the Anti-coagulation Track section of this encounter.  Patient instructed to take today's dose.  Patient instructed to take one (1) tablet of your 5mg  peach-colored warfarin tablets by mouth, each day. Patient verbalized understanding of these instructions.   Patient advised to contact clinic or seek medical attention if signs/symptoms of bleeding or thromboembolism occur.  Patient verbalized understanding by repeating back information and was advised to contact me if further medication-related questions arise. Patient was also provided an information handout.  Follow-up Return in 12 days (on 12/22/2021) for Follow up INR.  Pennie Banter, PharmD, CPP  15 minutes spent face-to-face with the patient during the encounter. 50% of time spent on education, including signs/sx bleeding and clotting, as well as food and  drug interactions with warfarin. 50% of time was spent on fingerprick POC INR sample collection,processing, results determination, and documentation in http://www.kim.net/.

## 2021-12-10 NOTE — Progress Notes (Signed)
Remote pacemaker transmission.   

## 2021-12-12 ENCOUNTER — Other Ambulatory Visit: Payer: Self-pay | Admitting: Internal Medicine

## 2021-12-15 NOTE — Patient Instructions (Signed)
Visit Information  Instructions:   Patient was given the following information about care management and care coordination services today, agreed to services, and gave verbal consent: 1.care management/care coordination services include personalized support from designated clinical staff supervised by their physician, including individualized plan of care and coordination with other care providers 2. 24/7 contact phone numbers for assistance for urgent and routine care needs. 3. The patient may stop care management/care coordination services at any time by phone call to the office staff.  Patient verbalizes understanding of instructions and care plan provided today and agrees to view in MyChart. Active MyChart status and patient understanding of how to access instructions and care plan via MyChart confirmed with patient.     No further follow up required: .  [name], BSW , MSW Social Worker [facility]  [phone]      

## 2021-12-17 ENCOUNTER — Telehealth: Payer: Self-pay | Admitting: Student

## 2021-12-17 NOTE — Telephone Encounter (Signed)
Received a page from patient's friend, [name].  They were wondering if patient can take NoDoz to help with sleep. NoDoz is actually caffeine tablet so advised her to not take it.  She also noted lower extremity edema of [name].  He has been taking torsemide 20 mg twice daily.  Advised patient to keep his appointment with [facility] next Monday 12/22/2021.  If he experiences significant lower extremity edema or worsening shortness of breath, I advised her to either call the clinic or go to the ED for immediate care.  She verbalized understanding.

## 2021-12-18 NOTE — Progress Notes (Signed)
INTERNAL MEDICINE TEACHING ATTENDING ADDENDUM ° °I agree with pharmacy recommendations as outlined in their note.  ° °-Saliyah Gillin MD ° °

## 2021-12-19 ENCOUNTER — Other Ambulatory Visit: Payer: Self-pay

## 2021-12-19 ENCOUNTER — Observation Stay (HOSPITAL_COMMUNITY)
Admission: EM | Admit: 2021-12-19 | Discharge: 2021-12-20 | Disposition: A | Discharge status: Home / Self Care | Payer: 59 | Attending: Internal Medicine | Admitting: Internal Medicine

## 2021-12-19 ENCOUNTER — Encounter (HOSPITAL_COMMUNITY): Payer: Self-pay

## 2021-12-19 ENCOUNTER — Emergency Department (HOSPITAL_COMMUNITY): Payer: 59

## 2021-12-19 DIAGNOSIS — I130 Hypertensive heart and chronic kidney disease with heart failure and stage 1 through stage 4 chronic kidney disease, or unspecified chronic kidney disease: Secondary | ICD-10-CM | POA: Diagnosis not present

## 2021-12-19 DIAGNOSIS — E785 Hyperlipidemia, unspecified: Secondary | ICD-10-CM | POA: Diagnosis not present

## 2021-12-19 DIAGNOSIS — I4891 Unspecified atrial fibrillation: Secondary | ICD-10-CM | POA: Diagnosis not present

## 2021-12-19 DIAGNOSIS — Z79899 Other long term (current) drug therapy: Secondary | ICD-10-CM | POA: Diagnosis not present

## 2021-12-19 DIAGNOSIS — R2681 Unsteadiness on feet: Secondary | ICD-10-CM | POA: Diagnosis not present

## 2021-12-19 DIAGNOSIS — E876 Hypokalemia: Secondary | ICD-10-CM | POA: Diagnosis not present

## 2021-12-19 DIAGNOSIS — Z7901 Long term (current) use of anticoagulants: Secondary | ICD-10-CM | POA: Insufficient documentation

## 2021-12-19 DIAGNOSIS — D509 Iron deficiency anemia, unspecified: Secondary | ICD-10-CM | POA: Insufficient documentation

## 2021-12-19 DIAGNOSIS — Z950 Presence of cardiac pacemaker: Secondary | ICD-10-CM | POA: Insufficient documentation

## 2021-12-19 DIAGNOSIS — Z8616 Personal history of COVID-19: Secondary | ICD-10-CM | POA: Insufficient documentation

## 2021-12-19 DIAGNOSIS — I5043 Acute on chronic combined systolic (congestive) and diastolic (congestive) heart failure: Principal | ICD-10-CM | POA: Insufficient documentation

## 2021-12-19 DIAGNOSIS — R0602 Shortness of breath: Secondary | ICD-10-CM | POA: Diagnosis present

## 2021-12-19 DIAGNOSIS — R059 Cough, unspecified: Secondary | ICD-10-CM | POA: Insufficient documentation

## 2021-12-19 DIAGNOSIS — N1832 Chronic kidney disease, stage 3b: Secondary | ICD-10-CM | POA: Insufficient documentation

## 2021-12-19 DIAGNOSIS — Z952 Presence of prosthetic heart valve: Secondary | ICD-10-CM | POA: Diagnosis not present

## 2021-12-19 DIAGNOSIS — I509 Heart failure, unspecified: Secondary | ICD-10-CM

## 2021-12-19 LAB — CBC
HCT: 35.8 % — ABNORMAL LOW (ref 39.0–52.0)
Hemoglobin: 10.1 g/dL — ABNORMAL LOW (ref 13.0–17.0)
MCH: 23.3 pg — ABNORMAL LOW (ref 26.0–34.0)
MCHC: 28.2 g/dL — ABNORMAL LOW (ref 30.0–36.0)
MCV: 82.7 fL (ref 80.0–100.0)
Platelets: 427 10*3/uL — ABNORMAL HIGH (ref 150–400)
RBC: 4.33 MIL/uL (ref 4.22–5.81)
RDW: 28.2 % — ABNORMAL HIGH (ref 11.5–15.5)
WBC: 5.6 10*3/uL (ref 4.0–10.5)
nRBC: 0.4 % — ABNORMAL HIGH (ref 0.0–0.2)

## 2021-12-19 LAB — BASIC METABOLIC PANEL
Anion gap: 9 (ref 5–15)
BUN: 23 mg/dL (ref 8–23)
CO2: 28 mmol/L (ref 22–32)
Calcium: 8.9 mg/dL (ref 8.9–10.3)
Chloride: 101 mmol/L (ref 98–111)
Creatinine, Ser: 1.8 mg/dL — ABNORMAL HIGH (ref 0.61–1.24)
GFR, Estimated: 38 mL/min — ABNORMAL LOW (ref >60)
Glucose, Bld: 96 mg/dL (ref 70–99)
Potassium: 3.2 mmol/L — ABNORMAL LOW (ref 3.5–5.1)
Sodium: 138 mmol/L (ref 135–145)

## 2021-12-19 LAB — TROPONIN I (HIGH SENSITIVITY)
Troponin I (High Sensitivity): 41 ng/L — ABNORMAL HIGH (ref <18)
Troponin I (High Sensitivity): 38 ng/L — ABNORMAL HIGH (ref <18)

## 2021-12-19 LAB — HEPATIC FUNCTION PANEL
ALT: 16 U/L (ref 0–44)
AST: 31 U/L (ref 15–41)
Albumin: 3.2 g/dL — ABNORMAL LOW (ref 3.5–5.0)
Alkaline Phosphatase: 58 U/L (ref 38–126)
Bilirubin, Direct: 0.3 mg/dL — ABNORMAL HIGH (ref 0.0–0.2)
Indirect Bilirubin: 1.1 mg/dL — ABNORMAL HIGH (ref 0.3–0.9)
Total Bilirubin: 1.4 mg/dL — ABNORMAL HIGH (ref 0.3–1.2)
Total Protein: 6.5 g/dL (ref 6.5–8.1)

## 2021-12-19 LAB — PROTIME-INR
INR: 3 — ABNORMAL HIGH (ref 0.8–1.2)
Prothrombin Time: 31 seconds — ABNORMAL HIGH (ref 11.4–15.2)

## 2021-12-19 LAB — BRAIN NATRIURETIC PEPTIDE: B Natriuretic Peptide: 238.8 pg/mL — ABNORMAL HIGH (ref 0.0–100.0)

## 2021-12-19 MED ORDER — CARVEDILOL 6.25 MG PO TABS
6.2500 mg | ORAL_TABLET | Freq: Two times a day (BID) | ORAL | Status: DC
  Administered 2021-12-19 – 2021-12-20 (×2): 6.25 mg via ORAL
  Filled 2021-12-19: qty 2
  Filled 2021-12-19: qty 1

## 2021-12-19 MED ORDER — ACETAMINOPHEN 650 MG RE SUPP: 650.0000 mg | Freq: Four times a day (QID) | RECTAL | Status: DC | PRN

## 2021-12-19 MED ORDER — FUROSEMIDE 8 MG/ML PO SOLN
40.0000 mg | Freq: Once | ORAL | Status: DC
Start: 2021-12-19 — End: 2021-12-19

## 2021-12-19 MED ORDER — POTASSIUM CHLORIDE CRYS ER 20 MEQ PO TBCR
40.0000 meq | EXTENDED_RELEASE_TABLET | ORAL | Status: AC
  Administered 2021-12-19 (×2): 40 meq via ORAL
  Filled 2021-12-19 (×2): qty 2

## 2021-12-19 MED ORDER — FUROSEMIDE 10 MG/ML IJ SOLN
40.0000 mg | Freq: Two times a day (BID) | INTRAMUSCULAR | Status: DC
  Administered 2021-12-19: 40 mg via INTRAVENOUS
  Filled 2021-12-19 (×2): qty 4

## 2021-12-19 MED ORDER — ACETAMINOPHEN 325 MG PO TABS: 650.0000 mg | ORAL_TABLET | Freq: Four times a day (QID) | ORAL | Status: DC | PRN

## 2021-12-19 MED ORDER — ATORVASTATIN CALCIUM 10 MG PO TABS
20.0000 mg | ORAL_TABLET | Freq: Every evening | ORAL | Status: DC
  Administered 2021-12-19: 20 mg via ORAL
  Filled 2021-12-19: qty 2

## 2021-12-19 MED ORDER — WARFARIN SODIUM 5 MG PO TABS
5.0000 mg | ORAL_TABLET | Freq: Once | ORAL | Status: AC
Start: 2021-12-19 — End: 2021-12-19
  Administered 2021-12-19: 5 mg via ORAL
  Filled 2021-12-19: qty 1

## 2021-12-19 MED ORDER — WARFARIN - PHARMACIST DOSING INPATIENT: Freq: Every day | Status: DC

## 2021-12-19 MED ORDER — GUAIFENESIN-DM 100-10 MG/5ML PO SYRP
5.0000 mL | ORAL_SOLUTION | ORAL | Status: DC | PRN
Start: 2021-12-19 — End: 2021-12-20

## 2021-12-19 MED ORDER — FUROSEMIDE 10 MG/ML IJ SOLN
40.0000 mg | Freq: Once | INTRAMUSCULAR | Status: AC
Start: 2021-12-19 — End: 2021-12-19
  Administered 2021-12-19: 40 mg via INTRAVENOUS
  Filled 2021-12-19: qty 4

## 2021-12-19 MED ORDER — FERROUS SULFATE 325 (65 FE) MG PO TABS
325.0000 mg | ORAL_TABLET | Freq: Every day | ORAL | Status: DC
  Administered 2021-12-19 – 2021-12-20 (×2): 325 mg via ORAL
  Filled 2021-12-19 (×2): qty 1

## 2021-12-19 NOTE — H&P (Cosign Needed)
Date: 12/19/2021     Patient Name:  [name] MRN: [id number]  DOB: 1945/08/18 Age / Sex: 76 y.o., male   PCP: [name], MD         Medical Service: Internal Medicine Teaching Service       Attending Physician: Dr. Lottie Mussel, MD    Intern (1st Contact): France Ravens, MD Pager: [phone]  Resident (2nd Contact): Gaylan Gerold, DO Pager: Liliane Shi [phone]       After Hours (After 5p/  First Contact Pager: [phone]  weekends / holidays): Second Contact Pager: [phone]   SUBJECTIVE:  Chief Complaint: Dyspnea  History of Present Illness: [name] is a 76 y.o. male with a pertinent PMH of HFrEF (EF 35 to 40% April 2023) secondary to NICM, IE status post mechanical mitral valve replacement on Coumadin, a flutter/A-fib, AV first degree heart block s/p pacemaker, CKD stage IIIb, hypertension, BPH, who presents to [facility] with dyspnea on exertion, cough, and BLE edema.   Patient reports over the past 2 weeks he has had significant leg swelling as well as dyspnea on exertion.  Patient denies any associated chest pain or orthopnea.  Patient also does report some persisting coughing has been going on for proxy 1 to 2 weeks.  Patient reports that this is a dry cough and has some ear rhinorrhea but denies productive sputum.  Patient denies any sick contacts in family.   Patient reports general medication compliance at home but does endorse that he has missed approximately 2 evening doses of medication per week.  Patient does report having increased urine output especially at night with current medication regimen.  Patient reports that patient's friend helps with his medication administration as he does not recall which medications he normally takes during which time.  Patient does not weigh himself at home and does not know his dry weight.  In the ED, the patient's chest x-ray showed mild pulmonary edema and small right pleural effusion and trace left pleural effusion.  Labs significant for BNP 238.8,  troponin 41, hemoglobin 10.1, platelet 427, potassium 3.2, INR 3.0.   Medications: No current facility-administered medications on file prior to encounter.   Current Outpatient Medications on File Prior to Encounter  Medication Sig Dispense Refill   acetaminophen (TYLENOL) 500 MG tablet Take 1,000 mg by mouth every 6 (six) hours as needed (for pain).     atorvastatin (LIPITOR) 20 MG tablet Take 1 tablet (20 mg total) by mouth every evening. (Patient taking differently: Take 20 mg by mouth See admin instructions. Take 20 mg by mouth at 4 PM every day) 30 tablet 2   carvedilol (COREG) 6.25 MG tablet Take 1 tablet (6.25 mg total) by mouth 2 (two) times daily with a meal. 180 tablet 3   ferrous sulfate 325 (65 FE) MG tablet Take 1 tablet (325 mg total) by mouth daily. 90 tablet 0   pantoprazole (PROTONIX) 40 MG tablet TAKE 1 TABLET (40 MG TOTAL) BY MOUTH TWICE A DAY BEFORE MEALS (Patient taking differently: Take 40 mg by mouth 2 (two) times daily before a meal.) 180 tablet 1   polyethylene glycol (MIRALAX) 17 g packet Take 17 g by mouth daily as needed. Take as needed, no more than twice daily if experiencing constipation 14 each 0   tamsulosin (FLOMAX) 0.4 MG CAPS capsule Take 2 capsules (0.8 mg total) by mouth daily after breakfast. 180 capsule 3   torsemide (DEMADEX) 20 MG tablet Take 1 tablet (20 mg total)  by mouth 2 (two) times daily. 180 tablet 0   warfarin (COUMADIN) 5 MG tablet Take 0.5 tablets (2.5 mg total) by mouth daily at 8 pm. Take 1/2 tablet of warfarin 5 mg tab on Saturday and Sunday. Follow[facility] on Monday 12/08/2021 with Dr. [name].      Past Medical History: Past Medical History:  Diagnosis Date   Acute GI bleeding 06/27/2021   Anemia 01/29/2016   Atrial fibrillation (HCC)    post op.  s/p dc-cv 04/2008. previously on amiodarone   Atrial flutter ([facility])    atypical   AV block, 1st degree    .450 msec--progressive   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  a- Echo 07/2008 Ef 55% mild peroprosthetic MVR with High transmitral gradient (mean 14).  b- normal coronaries by cath 12/2007   BPH (benign prostatic hyperplasia)    Cardiac resynchronization therapy pacemaker (CRT-P) in place 06/22/2011   CHF (congestive heart failure) (HCC)    EF 35-40 % 2011 due to valvular disease and diastolic dysfunction   Chronic back pain    CKD (chronic kidney disease), stage III (HCC)    COVID-19 virus infection 07/24/2020   Tested positive on July 16, 2020   Dysphagia    with normal barium swallow 09/2008   Endocarditis    GERD (gastroesophageal reflux disease)    Heavy alcohol use    history   History of atrial flutter 03/13/2008   History of atrial flutter - resolved.    History of GI bleed    Hypertension    IV drug abuse [facility])    history of   Lower GI bleed 01/2016   Memory impairment 12/24/2015   Pacemaker 2010    Social:  Lives - [address] [place],  [place] [address], friends Support - Good Level of function: Needed some help Occupation - retired PCP - Dellia Cloud Substance use: Nonprescription/Illicit - denied.  ETOH - none Tobacco - denied  Family History: Family History  Problem Relation Age of Onset   Coronary artery disease Mother    Cancer Father        GI   Cancer Brother        Lung    Allergies: Allergies as of 12/19/2021   (No Known Allergies)    Review of Systems: A complete ROS was negative except as per HPI.   OBJECTIVE:  Physical Exam: Blood pressure 134/83, pulse 85, temperature 98.2 F (36.8 C), temperature source Oral, resp. rate (!) 23, SpO2 96 %. Physical Exam Constitutional:      General: He is not in acute distress.    Appearance: He is not diaphoretic.  HENT:     Head: Normocephalic and atraumatic.  Cardiovascular:     Rate and Rhythm: Normal rate. Rhythm irregular.  Pulmonary:     Effort: Pulmonary effort is normal.     Breath sounds:  Examination of the right-lower field reveals rales. Examination of the left-lower field reveals rales. Rales present.  Chest:     Chest wall: No tenderness or edema.  Abdominal:     General: Bowel sounds are normal.     Palpations: Abdomen is soft.  Musculoskeletal:     Right lower leg: No tenderness. Edema present.     Left lower leg: No tenderness. Edema present.  Skin:    General: Skin is warm and dry.     Capillary Refill: Capillary refill takes less  than 2 seconds.  Neurological:     General: No focal deficit present.     Mental Status: He is alert and oriented to person, place, and time.  Psychiatric:        Mood and Affect: Mood normal.        Behavior: Behavior normal.     Pertinent Labs: CBC    Component Value Date/Time   WBC 5.6 12/19/2021 1129   RBC 4.33 12/19/2021 1129   HGB 10.1 (L) 12/19/2021 1129   HGB 9.2 (L) 11/03/2021 1104   HCT 35.8 (L) 12/19/2021 1129   HCT 32.6 (L) 11/03/2021 1104   PLT 427 (H) 12/19/2021 1129   PLT 476 (H) 11/03/2021 1104   MCV 82.7 12/19/2021 1129   MCV 73 (L) 11/03/2021 1104   MCH 23.3 (L) 12/19/2021 1129   MCHC 28.2 (L) 12/19/2021 1129   RDW 28.2 (H) 12/19/2021 1129   RDW 18.2 (H) 11/03/2021 1104   LYMPHSABS 1.4 12/01/2021 1146   LYMPHSABS 1.6 01/04/2020 1046   MONOABS 1.1 (H) 12/01/2021 1146   EOSABS 0.2 12/01/2021 1146   EOSABS 0.1 01/04/2020 1046   BASOSABS 0.1 12/01/2021 1146   BASOSABS 0.1 01/04/2020 1046     CMP     Component Value Date/Time   NA 138 12/19/2021 1129   NA 142 11/03/2021 1104   K 3.2 (L) 12/19/2021 1129   CL 101 12/19/2021 1129   CO2 28 12/19/2021 1129   GLUCOSE 96 12/19/2021 1129   BUN 23 12/19/2021 1129   BUN 29 (H) 11/03/2021 1104   CREATININE 1.80 (H) 12/19/2021 1129   CREATININE 1.37 (H) 12/21/2014 1612   CALCIUM 8.9 12/19/2021 1129   CALCIUM 8.9 06/26/2008 2248   PROT 6.5 12/19/2021 1129   PROT 7.4 01/04/2020 1046   ALBUMIN 3.2 (L) 12/19/2021 1129   ALBUMIN 4.4 01/04/2020 1046   AST  31 12/19/2021 1129   ALT 16 12/19/2021 1129   ALKPHOS 58 12/19/2021 1129   BILITOT 1.4 (H) 12/19/2021 1129   BILITOT 0.9 01/04/2020 1046   GFRNONAA 38 (L) 12/19/2021 1129   GFRAA 41 (L) 01/15/2020 1131    Pertinent Imaging: DG Chest 2 View  Result Date: 12/19/2021 CLINICAL DATA:  Shortness of breath and cough EXAM: CHEST - 2 VIEW COMPARISON:  12/03/2021 FINDINGS: Left-sided cardiac device with unchanged lead position. Mitral valve prosthesis. Redemonstrated cardiomegaly. Low lung volumes with pulmonary vascular congestion and bilateral perihilar and bibasilar interstitial opacities. Small right and likely trace left pleural effusions. Redemonstrated elevation of the left hemidiaphragm. No pneumothorax. No acute osseous abnormality. IMPRESSION: Cardiomegaly with pulmonary edema and a small right pleural effusion, with a suspected trace left pleural effusion, consistent with the patient's history of CHF. Electronically Signed   By: Wiliam Ke M.D.   On: 12/19/2021 11:44    EKG: Afib with junctional pacemaker. Qtc 509.  ASSESSMENT & PLAN:  Patient Summary: Mr. Cohan Stipes is a 76 year old-year-old male with past medical history of HFrEF 2/2 NICM, IV status post mechanical mitral valve replacement on Coumadin, A-fib/flutter, CRT-P for bradycardia, CKD stage IIIb, hypertension, BPH presenting to ED due to dyspnea on exertion and lower extremity edema and being admitted for acute on chronic CHF exacerbation likely secondary to inconsistent medication noncompliance.  Assessment: Active Problems:   Acute exacerbation of CHF (congestive heart failure) (HCC)   Plan: #Acute on chronic systolic and diastolic HFrEF 35 to 40% Last echo performed 10/2021. Patient is known to have persistent lower extremity edema at home but patient  does note that he persistently has more lower extremity edema than prior especially in the thighs.  It is reassuring that he has had only a BNP of 238.8 today, trops  41>38, as well as no oxygen requirements. She appears mildly hypervolemic with JVD and 1+ edema in BLE.  Anticipate that he may be able to discharge tomorrow following IV diuresis today with close outpatient follow up (already scheduled for 6/19 appointment).  Home med of torsemide 20 mg twice daily -Admit to IMTS, attending Dr. Saverio Danker, to telemed -Continue IV Lasix 40 mg twice daily -Strict I's/O and daily weights -AM BMP  Dry Cough Chest x-ray showed pulmonary edema consistent with CHF but no signs of pneumonia.  Does not appear to be of infectious process at this time.  We will continue to monitor. -Robitussin DM  Iron deficiency anemia Hgb 10.1 (8.7 from admission 2 wk ago) -Continue home oral iron supplementation  Hypokalemia K 3.2 -Replete K as needed -AM BMP  Mechanical heart valve -Warfarin per pharmacy consult  Atrial fibrillation Rate controlled with carvedilol and on Coumadin for anticoagulation -Continue home Coreg 6.25 twice daily with meals and Coumadin  CKD 3b, stable Cr 1.8 on admission (2.24 2 weeks ago) -AM BMP  Hyperlipidemia -Continue home Lipitor 20 mg daily  Best Practice: Diet: Cardiac diet IVF: none VTE: warfarin Code: Full Code AB: none Dispo: Observation with expected length of stay less than 2 midnights. Anticipated Discharge Location: Home Barriers to Discharge: Improving volume status   Signature: France Ravens, MD Internal Medicine Resident, PGY-1 [facility]  Pager #: [phone] (If no response, contact on-call pager) 6:11 PM, 12/19/2021   Please contact the on-call pager after 5 pm and on weekends at [phone].

## 2021-12-19 NOTE — ED Provider Triage Note (Signed)
Emergency Medicine Provider Triage Evaluation Note  [name] , a 76 y.o. male  was evaluated in triage.  Pt complains of SOB, increased leg swelling and wet cough for several days. Hx CHF, CKD, and afib. On torsemide  Review of Systems  Positive: SOB, leg swelling, wet cough Negative: Cp, fever  Physical Exam  BP (!) 106/58   Pulse 75   Temp 98.2 F (36.8 C) (Oral)   Resp 18   SpO2 93%  Gen:   Awake, no distress   Resp:  Normal effort  MSK:   Moves extremities without difficulty  Other:    Medical Decision Making  Medically screening exam initiated at 11:31 AM.  Appropriate orders placed.  [name] was informed that the remainder of the evaluation will be completed by another provider, this initial triage assessment does not replace that evaluation, and the importance of remaining in the ED until their evaluation is complete.     Mingo Siegert T, PA-C 12/19/21 1131

## 2021-12-19 NOTE — Progress Notes (Signed)
Pharmacy Consult for warfarin Indication:  mechanical  mitral valve replacement  No Known Allergies  Vital Signs: Temp: 98.2 F (36.8 C) (06/16 1126) Temp Source: Oral (06/16 1126) BP: 128/77 (06/16 1435) Pulse Rate: 81 (06/16 1435)  Labs: Recent Labs    12/19/21 1129 12/19/21 1353  HGB 10.1*  --   HCT 35.8*  --   PLT 427*  --   LABPROT  --  31.0*  INR  --  3.0*  CREATININE 1.80*  --     CrCl cannot be calculated (Unknown ideal weight.).  Assessment: [name] a 76 y.o. male with h/o mechanical mitral valve replacement and atrial fibrillation. Pharmacy has been consulted for warfarin dosing. Hgb 10.1. No signs of bleeding noted.   Anticoagulation PTA: warfarin 5 mg PO daily per recent anticoag visit (12/10/21)   Goal of Therapy:  INR 2.5-3.5 Monitor platelets by anticoagulation protocol: Yes   Plan:  Warfarin 5 mg PO x1 Daily CBC, INR  Monitor for signs and symptoms of bleeding    Jani Gravel, PharmD PGY-1 Acute Care Resident  12/19/2021 3:34 PM

## 2021-12-19 NOTE — ED Triage Notes (Signed)
Brought in by EMS for sob, increased leg swelling, wet cough 95%RA

## 2021-12-19 NOTE — ED Provider Notes (Signed)
[facility] EMERGENCY DEPARTMENT Provider Note   CSN: [id number] Arrival date & time: 12/19/21  1109     History  Chief Complaint  Patient presents with   Shortness of Breath    [name] is a 76 y.o. male hypertension, CKD stage III, AV first-degree heart block, pacemaker implantation, CHF, history of IV drug use with bacterial endocarditis in 2009.  Presents to the emergency department with a chief complaint of shortness of breath, cough, and bilateral leg edema.  Patient states that he has had shortness of breath and a nonproductive cough over the last week.  Shortness of breath has been getting progressively worse.  Patient states that he has had significant leg swelling over the last 3 to 4 days.  Patient reports that he takes medications at home but is unclear which medications he takes specifically.  Denies any fever, chills, chest pain, palpitations, hemoptysis, abdominal pain, nausea, vomiting, diarrhea, blood in stool, melena, lightheadedness, syncope, orthopnea, PND.  Denies any home oxygen use, illicit drug use, alcohol use, tobacco use.   Shortness of Breath Associated symptoms: cough   Associated symptoms: no abdominal pain, no chest pain, no fever, no headaches, no neck pain, no rash and no vomiting        Home Medications Prior to Admission medications   Medication Sig Start Date End Date Taking? Authorizing Provider  acetaminophen (TYLENOL) 500 MG tablet Take 1,000 mg by mouth every 6 (six) hours as needed (for pain).    [provider]  atorvastatin (LIPITOR) 20 MG tablet Take 1 tablet (20 mg total) by mouth every evening. Patient taking differently: Take 20 mg by mouth See admin instructions. Take 20 mg by mouth at 4 PM every day 09/16/21   Champ Mungo, DO  carvedilol (COREG) 6.25 MG tablet Take 1 tablet (6.25 mg total) by mouth 2 (two) times daily with a meal. 12/16/20   Roylene Reason, MD  ferrous sulfate 325 (65 FE) MG tablet  Take 1 tablet (325 mg total) by mouth daily. 12/05/21 03/05/22  Dellis Filbert, MD  pantoprazole (PROTONIX) 40 MG tablet TAKE 1 TABLET (40 MG TOTAL) BY MOUTH TWICE A DAY BEFORE MEALS Patient taking differently: Take 40 mg by mouth 2 (two) times daily before a meal. 10/15/21   Dellia Cloud, MD  polyethylene glycol (MIRALAX) 17 g packet Take 17 g by mouth daily as needed. Take as needed, no more than twice daily if experiencing constipation 12/05/21   Dellis Filbert, MD  tamsulosin [facility]) 0.4 MG CAPS capsule Take 2 capsules (0.8 mg total) by mouth daily after breakfast. 08/29/21   Demaio, Alexa, MD  torsemide (DEMADEX) 20 MG tablet Take 1 tablet (20 mg total) by mouth 2 (two) times daily. 12/06/21 03/06/22  Dellis Filbert, MD  warfarin (COUMADIN) 5 MG tablet Take 0.5 tablets (2.5 mg total) by mouth daily at 8 pm. Take 1/2 tablet of warfarin 5 mg tab on Saturday and Sunday. Follow-up in [facility] on Monday 12/08/2021 with Dr. [name]. 12/05/21   Doran Stabler, DO      Allergies    Patient has no known allergies.    Review of Systems   Review of Systems  Constitutional:  Negative for chills and fever.  Eyes:  Negative for visual disturbance.  Respiratory:  Positive for cough and shortness of breath.   Cardiovascular:  Positive for leg swelling. Negative for chest pain and palpitations.  Gastrointestinal:  Negative for abdominal pain, nausea and vomiting.  Musculoskeletal:  Negative for back pain and neck pain.  Skin:  Negative for color change and rash.  Neurological:  Negative for dizziness, syncope, light-headedness and headaches.  Psychiatric/Behavioral:  Negative for confusion.     Physical Exam Updated Vital Signs BP (!) 106/58   Pulse 75   Temp 98.2 F (36.8 C) (Oral)   Resp 18   SpO2 93%  Physical Exam Vitals and nursing note reviewed.  Constitutional:      General: He is not in acute distress.    Appearance: He is not ill-appearing, toxic-appearing or  diaphoretic.  HENT:     Head: Normocephalic.  Eyes:     General: No scleral icterus.       Right eye: No discharge.        Left eye: No discharge.  Cardiovascular:     Rate and Rhythm: Normal rate.     Pulses:          Radial pulses are 2+ on the right side and 2+ on the left side.     Comments: +2 edema to bilateral lower extremities to from feet to mid thigh mid thigh Pulmonary:     Effort: Pulmonary effort is normal. No tachypnea, bradypnea or respiratory distress.     Breath sounds: Normal breath sounds.     Comments: Subtle Rales noted to bilateral lower lobes, speaks in full complete sentences without difficulty Abdominal:     General: Abdomen is protuberant. There is no distension. There are no signs of injury.     Palpations: Abdomen is soft. There is no mass or pulsatile mass.     Tenderness: There is no abdominal tenderness. There is no guarding or rebound.  Musculoskeletal:     Right lower leg: 2+ Edema present.     Left lower leg: 2+ Edema present.  Skin:    General: Skin is warm and dry.  Neurological:     General: No focal deficit present.     Mental Status: He is alert.     GCS: GCS eye subscore is 4. GCS verbal subscore is 5. GCS motor subscore is 6.  Psychiatric:        Behavior: Behavior is cooperative.     ED Results / Procedures / Treatments   Labs (all labs ordered are listed, but only abnormal results are displayed) Labs Reviewed  BASIC METABOLIC PANEL - Abnormal; Notable for the following components:      Result Value   Potassium 3.2 (*)    Creatinine, Ser 1.80 (*)    GFR, Estimated 38 (*)    All other components within normal limits  CBC - Abnormal; Notable for the following components:   Hemoglobin 10.1 (*)    HCT 35.8 (*)    MCH 23.3 (*)    MCHC 28.2 (*)    RDW 28.2 (*)    Platelets 427 (*)    nRBC 0.4 (*)    All other components within normal limits  BRAIN NATRIURETIC PEPTIDE - Abnormal; Notable for the following components:   B  Natriuretic Peptide 238.8 (*)    All other components within normal limits  HEPATIC FUNCTION PANEL - Abnormal; Notable for the following components:   Albumin 3.2 (*)    Total Bilirubin 1.4 (*)    Bilirubin, Direct 0.3 (*)    Indirect Bilirubin 1.1 (*)    All other components within normal limits  PROTIME-INR - Abnormal; Notable for the following components:   Prothrombin Time 31.0 (*)  INR 3.0 (*)    All other components within normal limits  TROPONIN I (HIGH SENSITIVITY) - Abnormal; Notable for the following components:   Troponin I (High Sensitivity) 41 (*)    All other components within normal limits  TROPONIN I (HIGH SENSITIVITY)    EKG EKG Interpretation  Date/Time:  Friday December 19 2021 11:24:29 EDT Ventricular Rate:  79 PR Interval:    QRS Duration: 86 QT Interval:  444 QTC Calculation: 509 R Axis:   30 Text Interpretation: Atrial fibrillation with a competing junctional pacemaker Nonspecific T wave abnormality Prolonged QT Abnormal ECG When compared with ECG of 04-Dec-2021 14:24, PREVIOUS ECG IS PRESENT No acute changes No significant change since last tracing Confirmed by Varney Biles [id number]) on 12/19/2021 1:50:05 PM  Radiology DG Chest 2 View  Result Date: 12/19/2021 CLINICAL DATA:  Shortness of breath and cough EXAM: CHEST - 2 VIEW COMPARISON:  12/03/2021 FINDINGS: Left-sided cardiac device with unchanged lead position. Mitral valve prosthesis. Redemonstrated cardiomegaly. Low lung volumes with pulmonary vascular congestion and bilateral perihilar and bibasilar interstitial opacities. Small right and likely trace left pleural effusions. Redemonstrated elevation of the left hemidiaphragm. No pneumothorax. No acute osseous abnormality. IMPRESSION: Cardiomegaly with pulmonary edema and a small right pleural effusion, with a suspected trace left pleural effusion, consistent with the patient's history of CHF. Electronically Signed   By: Merilyn Baba M.D.   On: 12/19/2021  11:44    Procedures .Critical Care  Performed by: [name], PA-C Authorized by: [name], PA-C   Critical care provider statement:    Critical care time (minutes):  30   Critical care was necessary to treat or prevent imminent or life-threatening deterioration of the following conditions:  Respiratory failure and circulatory failure   Critical care was time spent personally by me on the following activities:  Development of treatment plan with patient or surrogate, evaluation of patient's response to treatment, examination of patient, ordering and review of laboratory studies, ordering and review of radiographic studies, ordering and performing treatments and interventions, pulse oximetry, re-evaluation of patient's condition, review of old charts and obtaining history from patient or surrogate   Care discussed with: admitting provider       Medications Ordered in ED Medications  potassium chloride SA (KLOR-CON M) CR tablet 40 mEq (has no administration in time range)  furosemide (LASIX) injection 40 mg (40 mg Intravenous Given 12/19/21 1433)    ED Course/ Medical Decision Making/ A&P Clinical Course as of 12/19/21 1450  Fri Dec 19, 2021  1429 I spoke to Dr.Nguyen with internal medicine team who will see the patient for admission. [PB]    Clinical Course User Index [PB] [name], PA-C                           Medical Decision Making Amount and/or Complexity of Data Reviewed Labs: ordered. Radiology: ordered.  Risk Prescription drug management. Decision regarding hospitalization.   Alert 76 year old male in no acute distress, nontoxic-appearing.  Presents to the emergency department with a chief complaint of shortness of breath, cough, and bilateral lower leg swelling.  Information obtained from patient.  Past medical records were reviewed including previous provider notes from admission/discharge, outpatient provider notes, labs, and  imaging.  Per chart review patient was recently admitted to the hospital from 5/29 to 6/2 for acute exacerbation of CHF, hypokalemia, and mechanical fall.  Patient is followed by internal medicine team.  With  reports of shortness of breath and leg swelling concern for acute CHF exacerbation.  Differential also includes but is not limited to ACS and pneumonia.  I personally viewed and interpreted patient's EKG.  Tracing shows rate controlled atrial fibrillation.  I personally viewed and interpreted patient's chest x-ray.  Imaging shows cardiomegaly with pulm edema and small right pleural effusion, trace left pleural effusion.  I personally viewed and interpreted patient's lab results.  Pertinent findings include" -Troponin 41, appears baseline; will check delta Trope -BNP 238 -Hypokalemia with potassium 3.2 -Creatinine 1.8 which appears to be his baseline.  Oxygen saturation drops to 87% on room air while patient is active in bed.  We will have patient stand and ambulate to check SPO2.  Concern for CHF exacerbation we will give patient IV Lasix.  We will consult internal medicine team for admission.  Patient was discussed with and evaluated by Dr. Kathrynn Humble.        Final Clinical Impression(s) / ED Diagnoses Final diagnoses:  Acute on chronic congestive heart failure, unspecified heart failure type [facility])    Rx / DC Orders ED Discharge Orders     None         [name], PA-C 12/19/21 1500    Varney Biles, MD 12/20/21 1123

## 2021-12-20 DIAGNOSIS — D509 Iron deficiency anemia, unspecified: Secondary | ICD-10-CM

## 2021-12-20 DIAGNOSIS — N1832 Chronic kidney disease, stage 3b: Secondary | ICD-10-CM | POA: Diagnosis not present

## 2021-12-20 DIAGNOSIS — I4891 Unspecified atrial fibrillation: Secondary | ICD-10-CM

## 2021-12-20 DIAGNOSIS — I130 Hypertensive heart and chronic kidney disease with heart failure and stage 1 through stage 4 chronic kidney disease, or unspecified chronic kidney disease: Secondary | ICD-10-CM | POA: Diagnosis not present

## 2021-12-20 DIAGNOSIS — Z87891 Personal history of nicotine dependence: Secondary | ICD-10-CM

## 2021-12-20 DIAGNOSIS — I5043 Acute on chronic combined systolic (congestive) and diastolic (congestive) heart failure: Secondary | ICD-10-CM | POA: Diagnosis not present

## 2021-12-20 DIAGNOSIS — I5041 Acute combined systolic (congestive) and diastolic (congestive) heart failure: Secondary | ICD-10-CM

## 2021-12-20 DIAGNOSIS — E785 Hyperlipidemia, unspecified: Secondary | ICD-10-CM

## 2021-12-20 LAB — CBC
HCT: 35.4 % — ABNORMAL LOW (ref 39.0–52.0)
Hemoglobin: 10.4 g/dL — ABNORMAL LOW (ref 13.0–17.0)
MCH: 23.3 pg — ABNORMAL LOW (ref 26.0–34.0)
MCHC: 29.4 g/dL — ABNORMAL LOW (ref 30.0–36.0)
MCV: 79.2 fL — ABNORMAL LOW (ref 80.0–100.0)
Platelets: 422 10*3/uL — ABNORMAL HIGH (ref 150–400)
RBC: 4.47 MIL/uL (ref 4.22–5.81)
RDW: 28.7 % — ABNORMAL HIGH (ref 11.5–15.5)
WBC: 6.2 10*3/uL (ref 4.0–10.5)
nRBC: 0 % (ref 0.0–0.2)

## 2021-12-20 LAB — BASIC METABOLIC PANEL
Anion gap: 11 (ref 5–15)
BUN: 21 mg/dL (ref 8–23)
CO2: 22 mmol/L (ref 22–32)
Calcium: 8.9 mg/dL (ref 8.9–10.3)
Chloride: 102 mmol/L (ref 98–111)
Creatinine, Ser: 1.71 mg/dL — ABNORMAL HIGH (ref 0.61–1.24)
GFR, Estimated: 40 mL/min — ABNORMAL LOW (ref >60)
Glucose, Bld: 88 mg/dL (ref 70–99)
Potassium: 3.6 mmol/L (ref 3.5–5.1)
Sodium: 135 mmol/L (ref 135–145)

## 2021-12-20 LAB — PROTIME-INR
INR: 3.1 — ABNORMAL HIGH (ref 0.8–1.2)
Prothrombin Time: 32 seconds — ABNORMAL HIGH (ref 11.4–15.2)

## 2021-12-20 MED ORDER — GUAIFENESIN-DM 100-10 MG/5ML PO SYRP: 5.0000 mL | ORAL_SOLUTION | ORAL | 0 refills | Status: DC | PRN

## 2021-12-20 MED ORDER — FUROSEMIDE 10 MG/ML IJ SOLN
40.0000 mg | Freq: Once | INTRAMUSCULAR | Status: AC
Start: 2021-12-20 — End: 2021-12-20
  Administered 2021-12-20: 40 mg via INTRAVENOUS

## 2021-12-20 MED ORDER — WARFARIN SODIUM 5 MG PO TABS
5.0000 mg | ORAL_TABLET | Freq: Once | ORAL | Status: DC
Start: 2021-12-20 — End: 2021-12-20

## 2021-12-20 NOTE — Progress Notes (Signed)
PT Cancellation Note  Patient Details Name: [name] MRN: [id number] DOB: 1945/09/02   Cancelled Treatment:    Reason Eval/Treat Not Completed: PT screened, no needs identified, will sign off. Per OT pt independent and at baseline.    Angelina Ok [facility] 12/20/2021, 9:45 AM Skip Mayer PT Acute [employer] [phone]

## 2021-12-20 NOTE — Evaluation (Signed)
Occupational Therapy Evaluation Patient Details Name: [name] MRN: [id number] DOB: 1946-05-25 Today's Date: 12/20/2021   History of Present Illness [name] is a 76 year old-year-old male with past medical history of HFrEF 2/2 NICM, IV status post mechanical mitral valve replacement on Coumadin, A-fib/flutter, CRT-P for bradycardia, CKD stage IIIb, hypertension, BPH presenting to ED due to dyspnea on exertion and lower extremity edema and being admitted for acute on chronic CHF exacerbation likely secondary to inconsistent medication noncompliance. Recent d/c 12/05/21 with HHPT)   Clinical Impression   Pt PTA: Pt reporting independence with ADL and mobility using SPC. Family is available to assist as needed 24/7. Pt currently, with no new deficits noted. Pt performing all ADL functional tasks at sink and in room without physical assist or LOB. Pt ambulating 100' on RA with O2 remaining >96% on RA using SPC.  Pt managing 1/2 flight of steps with 1 railing and SPC with supervisionA. O2 stayed >95% throughout ambulation and stair management. Pt does not require continued OT skilled services. OT signing off.     Recommendations for follow up therapy are one component of a multi-disciplinary discharge planning process, led by the attending physician.  Recommendations may be updated based on patient status, additional functional criteria and insurance authorization.   Follow Up Recommendations  No OT follow up    Assistance Recommended at Discharge Set up Supervision/Assistance  Patient can return home with the following Assist for transportation;Assistance with cooking/housework    Functional Status Assessment  Patient has had a recent decline in their functional status and demonstrates the ability to make significant improvements in function in a reasonable and predictable amount of time.  Equipment Recommendations  Tub/shower seat    Recommendations for Other Services        Precautions / Restrictions Precautions Precautions: None Restrictions Weight Bearing Restrictions: No      Mobility Bed Mobility Overal bed mobility: Modified Independent                  Transfers Overall transfer level: Needs assistance Equipment used: Straight cane Transfers: Sit to/from Stand Sit to Stand: Supervision                  Balance Overall balance assessment: No apparent balance deficits (not formally assessed)                                         ADL either performed or assessed with clinical judgement   ADL Overall ADL's : Modified independent;At baseline                                     Functional mobility during ADLs: Supervision/safety;Cane General ADL Comments: Pt with no new deficits noted. Pt performing all ADL functional tasks at sink and in room without physical assist or LOB. Pt ambulating with SPC. Pt ambulating 100' on RA with O2 remaining >96% on RA.     Vision Baseline Vision/History: 1 Wears glasses Ability to See in Adequate Light: 0 Adequate Patient Visual Report: No change from baseline Vision Assessment?: No apparent visual deficits     Perception     Praxis      Pertinent Vitals/Pain Pain Assessment Pain Assessment: 0-10 Pain Score: 0-No pain     Hand Dominance Right   Extremity/Trunk Assessment  Upper Extremity Assessment Upper Extremity Assessment: Overall WFL for tasks assessed   Lower Extremity Assessment Lower Extremity Assessment: Overall WFL for tasks assessed   Cervical / Trunk Assessment Cervical / Trunk Assessment: Kyphotic   Communication Communication Communication: No difficulties   Cognition Arousal/Alertness: Awake/alert Behavior During Therapy: WFL for tasks assessed/performed Overall Cognitive Status: Within Functional Limits for tasks assessed                                 General Comments: Pt following all commands, able to  state PLOF, CLOF at home and answer all open ended questions appropriately. Pt appears to have good safety awareness.     General Comments  Pt managing 1/2 flight of steps with 1 railing and SPC with supervisionA.    Exercises     Shoulder Instructions      Home Living Family/patient expects to be discharged to:: Private residence Living Arrangements: Spouse/significant other;Children;Other relatives Available Help at Discharge: Family;Available 24 hours/day Type of Home: House Home Access: Stairs to enter [employer] of Steps: 5 Entrance Stairs-Rails: Right Home Layout: One level     Bathroom Shower/Tub: Chief Strategy Officer: Standard     Home Equipment: Cane - single Librarian, academic (2 wheels)      Lives With: Family;Daughter    Prior Functioning/Environment Prior Level of Function : Independent/Modified Independent             Mobility Comments: Uses cane for ambulation ADLs Comments: Performs all BADLs Mod I.  Family provides transportation as pt does not drive, and family performs all household chores.        OT Problem List: Decreased activity tolerance      OT Treatment/Interventions:      OT Goals(Current goals can be found in the care plan section) Acute Rehab OT Goals Patient Stated Goal: to go home and get rid of cough OT Goal Formulation: All assessment and education complete, DC therapy Potential to Achieve Goals: Good  OT Frequency:      Co-evaluation              AM-PAC OT "6 Clicks" Daily Activity     Outcome Measure Help from another person eating meals?: None Help from another person taking care of personal grooming?: None Help from another person toileting, which includes using toliet, bedpan, or urinal?: None Help from another person bathing (including washing, rinsing, drying)?: None Help from another person to put on and taking off regular upper body clothing?: None Help from another person to put  on and taking off regular lower body clothing?: A Little 6 Click Score: 23   End of Session Equipment Utilized During Treatment: Gait belt;Other (comment) [facility]) Nurse Communication: Mobility status  Activity Tolerance: Patient tolerated treatment well Patient left: in chair;with call bell/phone within reach  OT Visit Diagnosis: Unsteadiness on feet (R26.81)                Time: 8295-6213 OT Time Calculation (min): 25 min Charges:  OT General Charges $OT Visit: 1 Visit OT Evaluation $OT Eval Moderate Complexity: 1 Mod OT Treatments $Self Care/Home Management : 8-22 mins  Flora Lipps, OTR/L Acute Rehabilitation Services Office: [phone]   Lonzo Cloud 12/20/2021, 9:51 AM

## 2021-12-20 NOTE — Therapy (Signed)
SATURATION QUALIFICATIONS: (This note is used to comply with regulatory documentation for home oxygen)   Patient Saturations on Room Air at Rest = 96%   Patient Saturations on Room Air while Ambulating = 95-96%   Patient Saturations on 2 Liters of oxygen at rest= did not need   Please briefly explain why patient needs home oxygen: Pt is able to maintain O2 sats >90% during functional activity on RA at this time and does not require Home O2 at this time  Flora Lipps, OTR/L Acute Rehabilitation Services Office: [phone]

## 2021-12-20 NOTE — Discharge Summary (Signed)
Name: [name] MRN: [id number] DOB: 1945-11-17 76 y.o. 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   atorvastatin 20 MG tablet Commonly known as: LIPITOR TAKE 1 TABLET BY MOUTH EVERY DAY IN THE EVENING What changed:  how much to take how to take this   carvedilol 6.25 MG tablet Commonly known as: COREG Take 1 tablet (6.25 mg total) by mouth 2 (two) times daily with a meal.   ferrous sulfate 325 (65 FE) MG tablet Take 1 tablet (325 mg total) by mouth daily.   guaiFENesin-dextromethorphan 100-10 MG/5ML syrup Commonly known as: ROBITUSSIN DM Take 5 mLs by mouth every 4 (four) hours as needed for cough.   pantoprazole 40 MG tablet Commonly known as: PROTONIX TAKE 1 TABLET (40 MG TOTAL) BY MOUTH TWICE A DAY BEFORE MEALS What changed: See the new instructions.   polyethylene glycol 17 g packet Commonly known as: MiraLax Take 17 g by mouth daily as needed. Take as needed, no more than twice daily if experiencing constipation   tamsulosin 0.4 MG Caps capsule Commonly known as: FLOMAX Take 2 capsules (0.8 mg total) by mouth daily after breakfast.   torsemide 20 MG tablet Commonly known as: DEMADEX Take 1 tablet (20 mg total) by mouth 2 (two) times daily.   warfarin 5 MG tablet Commonly known as: Coumadin Take as directed. If you are unsure how to take this medication, talk to your nurse or doctor. Original instructions: Take 0.5 tablets (2.5 mg total) by  mouth daily at 8 pm. Take 1/2 tablet of warfarin 5 mg tab on Saturday and Sunday. Follow-up in [facility] on Monday 12/08/2021 with Dr. Alexandria Hubbard.        Disposition and follow-up:   [name] was discharged from [facility] in Stable condition.  At the hospital follow up visit please address:  1.   A.  Obtain BMP and mag to assess electrolyte status; home medication torsemide has been resumed at discharge and patient was noted to be hypokalemic on admission which was repleted at time of discharge  B.  INR was appropriate therapeutic this admission.  Has follow-up with INR clinic on 12/22/2021.   2.  Labs / imaging needed at time of follow-up: BMP, Mg  3.  Pending labs/ test needing follow-up: none  Follow-up Appointments:  Follow-up Information     Richard Cloud, MD. Go on 12/22/2021.   Specialty: Internal Medicine Why: @10 :15am Contact information: [address] [place] [phone]         [phone], MD .   Specialty: Cardiology Contact information: [phone] [address] [place] [id number]                 Hospital Course by problem list: #Acute on chronic systolic and diastolic heart failure  Patient noted to have persistent lower extremity edema at home and dyspnea on exertion.  Patient reports some missed evening  doses of his torsemide which would likely explain his acute heart failure exacerbation.  Patient was diuresed with IV Lasix 40 mg twice daily and appeared to respond well to this with good urine output.  Patient's weight at discharge was 87.5 kg.  Unclear exactly what his dry weight is but on last hospitalization he was at 90.9 kg.   #MR s/p MVR on coumadin  INR was therapeutic throughout hospitalization and INR was at 3.1 on discharge.  Patient has follow-up with INR clinic on 12/22/2021.  #Atrial fibrillation  Patient was asymptomatic and did  show some atrial fibrillation on telemetry.  Patient was rate controlled on Coreg and on Coumadin already for anticoagulation.  #Microcytic Anemia Patient continues to have microcytic anemia but this appears to have improved compared to last hospitalization.  Hemoglobin 10.4 on discharge with MCV 79.2.  Patient was recommended to continue iron supplementation.  Other chronic conditions were medically managed with home medications and formulary alternatives as necessary (hypertension, hyperlipidemia, CKD stage IIIb)  Discharge Exam:   BP (!) 112/55 (BP Location: Right Arm)   Pulse 90   Temp 98 F (36.7 C) (Oral)   Resp 20   Wt 87.5 kg   SpO2 94%   BMI 29.33 kg/m  Discharge exam:  Constitutional: normal-appearing elderly gentleman sitting at the edge of the bed, in no acute distress HENT: normocephalic atraumatic, mucous membranes moist Eyes: conjunctiva non-erythematous Neck: supple Cardiovascular: regular rate and irregular rhythm, no m/r/g; no lower extremity edema Pulmonary/Chest: normal work of breathing on room air, decreased breath sounds, no wheezing/rales/rhonchi Abdominal: soft, non-tender, non-distended MSK: normal bulk and tone Neurological: alert & oriented x 2, Skin: warm and dry Psych: Normal behavior   Pertinent Labs, Studies, and Procedures:     Latest Ref Rng & Units 12/20/2021    2:09 AM 12/19/2021   11:29 AM 12/05/2021    6:47 AM  CBC  WBC 4.0 - 10.5 K/uL 6.2  5.6  8.2   Hemoglobin 13.0 - 17.0 g/dL 10.4  10.1  8.7   Hematocrit 39.0 - 52.0 % 35.4  35.8  31.3   Platelets 150 - 400 K/uL 422  427  403       Latest Ref Rng & Units 12/20/2021    2:09 AM 12/19/2021   11:29 AM 12/05/2021    6:47 AM  BMP  Glucose 70 - 99 mg/dL 88  96  104   BUN 8 - 23 mg/dL 21  23  33   Creatinine 0.61 - 1.24 mg/dL 1.71  1.80  2.24   Sodium 135 - 145 mmol/L 135  138  139   Potassium 3.5 - 5.1 mmol/L 3.6  3.2  4.2   Chloride 98 - 111 mmol/L 102  101  106   CO2 22 - 32 mmol/L 22   28  24    Calcium 8.9 - 10.3 mg/dL 8.9  8.9  9.4     Discharge Instructions: Discharge Instructions     (HEART FAILURE PATIENTS) Call MD:  Anytime you have any of the following symptoms: 1) 3 pound weight gain in 24 hours or 5 pounds in 1 week 2) shortness of breath, with or without a dry hacking cough 3) swelling in the hands, feet or stomach 4) if you have to sleep on extra pillows at night in order to breathe.   Complete by: As directed    Call MD for:  difficulty breathing, headache or visual disturbances   Complete by: As directed  Call MD for:  extreme fatigue   Complete by: As directed    Call MD for:  hives   Complete by: As directed    Call MD for:  persistant dizziness or light-headedness   Complete by: As directed    Call MD for:  redness, tenderness, or signs of infection (pain, swelling, redness, odor or green/yellow discharge around incision site)   Complete by: As directed    Diet - low sodium heart healthy   Complete by: As directed    Discharge instructions   Complete by: As directed    Dear Mr. Ambrocio,  It was a pleasure taking care of you while in the hospital.  You were admitted due to excessive leg swelling as well as shortness of breath when you move around.  We gave you some IV diuretic to help get some fluid off of you.  You responded to this well and it appears that your legs are less swollen than when he came in.  You were initially briefly on oxygen but when we walked you around the hospital, it appears he did not require any further oxygen so we will not be discharging you on oxygen.  Please continue taking medications as prescribed.  If you continue to have dry cough, please take Robitussin over-the-counter as that appears to have helped with that.  The IM clinic office will reach out to you next week to schedule a hospital follow-up visit so they can better help with management of your leg swelling.  If you have worsening of symptoms or noticed her legs are  significantly more swollen than they have before, please call your Primary care provider or return to ED.  Take care! -[facility]   Increase activity slowly   Complete by: As directed        Signed: Park Pope, MD 12/20/2021, 11:08 AM   Pager: [phone]

## 2021-12-20 NOTE — Progress Notes (Signed)
Pharmacy Consult for warfarin Indication:  mechanical  mitral valve replacement  No Known Allergies  Vital Signs: Temp: 97.9 F (36.6 C) (06/17 0517) Temp Source: Oral (06/17 0517) BP: 102/58 (06/17 0517) Pulse Rate: 77 (06/17 0517)  Labs: Recent Labs    12/19/21 1129 12/19/21 1353 12/20/21 0209  HGB 10.1*  --  10.4*  HCT 35.8*  --  35.4*  PLT 427*  --  422*  LABPROT  --  31.0* 32.0*  INR  --  3.0* 3.1*  CREATININE 1.80*  --  1.71*     Estimated Creatinine Clearance: 38.3 mL/min (A) (by C-G formula based on SCr of 1.71 mg/dL (H)).  Assessment: [name] a 76 y.o. male with h/o mechanical mitral valve replacement and atrial fibrillation. Pharmacy has been consulted for warfarin dosing. Hgb 10.4. No signs of bleeding noted. INR therapeutic at 3.1.  Anticoagulation PTA: warfarin 5 mg PO daily per recent anticoag visit (12/10/21)   Goal of Therapy:  INR 2.5-3.5 Monitor platelets by anticoagulation protocol: Yes   Plan:  Warfarin 5 mg PO x1 Daily CBC, INR  Monitor for signs and symptoms of bleeding   Thank you for allowing pharmacy to participate in this patient's care.  Enos Fling, PharmD PGY1 Pharmacy Resident 12/20/2021 8:32 AM Check [email or web address] for unit specific pharmacy number

## 2021-12-22 ENCOUNTER — Ambulatory Visit: Payer: 59

## 2021-12-23 ENCOUNTER — Telehealth: Payer: Self-pay | Admitting: *Deleted

## 2021-12-23 ENCOUNTER — Encounter: Payer: Self-pay | Admitting: Neurology

## 2021-12-23 ENCOUNTER — Institutional Professional Consult (permissible substitution): Payer: 59 | Admitting: Neurology

## 2021-12-23 NOTE — Telephone Encounter (Signed)
Please follow dismissal protocol as per our No Show Policy.   

## 2021-12-23 NOTE — Telephone Encounter (Signed)
Pt no showed for appt today (2nd).

## 2021-12-25 ENCOUNTER — Encounter: Payer: Self-pay | Admitting: Neurology

## 2021-12-25 ENCOUNTER — Encounter: Payer: 59 | Admitting: Internal Medicine

## 2021-12-25 ENCOUNTER — Observation Stay (HOSPITAL_COMMUNITY)
Admission: EM | Admit: 2021-12-25 | Discharge: 2021-12-28 | Disposition: A | Discharge status: Home / Self Care | Payer: 59 | Attending: Internal Medicine | Admitting: Internal Medicine

## 2021-12-25 ENCOUNTER — Encounter (HOSPITAL_COMMUNITY): Payer: Self-pay | Admitting: Emergency Medicine

## 2021-12-25 ENCOUNTER — Other Ambulatory Visit: Payer: Self-pay

## 2021-12-25 ENCOUNTER — Emergency Department (HOSPITAL_COMMUNITY): Payer: 59

## 2021-12-25 DIAGNOSIS — Z950 Presence of cardiac pacemaker: Secondary | ICD-10-CM | POA: Insufficient documentation

## 2021-12-25 DIAGNOSIS — Z7901 Long term (current) use of anticoagulants: Secondary | ICD-10-CM | POA: Diagnosis not present

## 2021-12-25 DIAGNOSIS — E876 Hypokalemia: Secondary | ICD-10-CM | POA: Insufficient documentation

## 2021-12-25 DIAGNOSIS — Z8616 Personal history of COVID-19: Secondary | ICD-10-CM | POA: Insufficient documentation

## 2021-12-25 DIAGNOSIS — I130 Hypertensive heart and chronic kidney disease with heart failure and stage 1 through stage 4 chronic kidney disease, or unspecified chronic kidney disease: Secondary | ICD-10-CM | POA: Insufficient documentation

## 2021-12-25 DIAGNOSIS — N1832 Chronic kidney disease, stage 3b: Secondary | ICD-10-CM | POA: Insufficient documentation

## 2021-12-25 DIAGNOSIS — Z20822 Contact with and (suspected) exposure to covid-19: Secondary | ICD-10-CM | POA: Diagnosis not present

## 2021-12-25 DIAGNOSIS — Z79899 Other long term (current) drug therapy: Secondary | ICD-10-CM | POA: Diagnosis not present

## 2021-12-25 DIAGNOSIS — Z952 Presence of prosthetic heart valve: Secondary | ICD-10-CM | POA: Insufficient documentation

## 2021-12-25 DIAGNOSIS — N401 Enlarged prostate with lower urinary tract symptoms: Secondary | ICD-10-CM | POA: Diagnosis present

## 2021-12-25 DIAGNOSIS — I4821 Permanent atrial fibrillation: Secondary | ICD-10-CM | POA: Insufficient documentation

## 2021-12-25 DIAGNOSIS — I5023 Acute on chronic systolic (congestive) heart failure: Secondary | ICD-10-CM | POA: Diagnosis not present

## 2021-12-25 DIAGNOSIS — R600 Localized edema: Secondary | ICD-10-CM | POA: Diagnosis present

## 2021-12-25 DIAGNOSIS — R053 Chronic cough: Secondary | ICD-10-CM | POA: Insufficient documentation

## 2021-12-25 DIAGNOSIS — I4891 Unspecified atrial fibrillation: Secondary | ICD-10-CM | POA: Diagnosis present

## 2021-12-25 DIAGNOSIS — I509 Heart failure, unspecified: Secondary | ICD-10-CM

## 2021-12-25 LAB — CBC WITH DIFFERENTIAL/PLATELET
Abs Immature Granulocytes: 0 10*3/uL (ref 0.00–0.07)
Basophils Absolute: 0.1 10*3/uL (ref 0.0–0.1)
Basophils Relative: 1 %
Eosinophils Absolute: 0 10*3/uL (ref 0.0–0.5)
Eosinophils Relative: 0 %
HCT: 38.4 % — ABNORMAL LOW (ref 39.0–52.0)
Hemoglobin: 10.9 g/dL — ABNORMAL LOW (ref 13.0–17.0)
Lymphocytes Relative: 5 %
Lymphs Abs: 0.4 10*3/uL — ABNORMAL LOW (ref 0.7–4.0)
MCH: 23.3 pg — ABNORMAL LOW (ref 26.0–34.0)
MCHC: 28.4 g/dL — ABNORMAL LOW (ref 30.0–36.0)
MCV: 82.1 fL (ref 80.0–100.0)
Monocytes Absolute: 0.8 10*3/uL (ref 0.1–1.0)
Monocytes Relative: 10 %
Neutro Abs: 6.9 10*3/uL (ref 1.7–7.7)
Neutrophils Relative %: 84 %
Platelets: 395 10*3/uL (ref 150–400)
RBC: 4.68 MIL/uL (ref 4.22–5.81)
RDW: 29.2 % — ABNORMAL HIGH (ref 11.5–15.5)
WBC: 8.2 10*3/uL (ref 4.0–10.5)
nRBC: 0 % (ref 0.0–0.2)
nRBC: 0 /100 WBC

## 2021-12-25 LAB — COMPREHENSIVE METABOLIC PANEL
ALT: 15 U/L (ref 0–44)
AST: 30 U/L (ref 15–41)
Albumin: 3.5 g/dL (ref 3.5–5.0)
Alkaline Phosphatase: 63 U/L (ref 38–126)
Anion gap: 11 (ref 5–15)
BUN: 21 mg/dL (ref 8–23)
CO2: 26 mmol/L (ref 22–32)
Calcium: 9.2 mg/dL (ref 8.9–10.3)
Chloride: 96 mmol/L — ABNORMAL LOW (ref 98–111)
Creatinine, Ser: 1.72 mg/dL — ABNORMAL HIGH (ref 0.61–1.24)
GFR, Estimated: 40 mL/min — ABNORMAL LOW (ref >60)
Glucose, Bld: 114 mg/dL — ABNORMAL HIGH (ref 70–99)
Potassium: 3.2 mmol/L — ABNORMAL LOW (ref 3.5–5.1)
Sodium: 133 mmol/L — ABNORMAL LOW (ref 135–145)
Total Bilirubin: 1.6 mg/dL — ABNORMAL HIGH (ref 0.3–1.2)
Total Protein: 7 g/dL (ref 6.5–8.1)

## 2021-12-25 LAB — PROTIME-INR
INR: 3 — ABNORMAL HIGH (ref 0.8–1.2)
Prothrombin Time: 30.7 seconds — ABNORMAL HIGH (ref 11.4–15.2)

## 2021-12-25 LAB — SARS CORONAVIRUS 2 BY RT PCR: SARS Coronavirus 2 by RT PCR: NEGATIVE

## 2021-12-25 LAB — BRAIN NATRIURETIC PEPTIDE: B Natriuretic Peptide: 366.8 pg/mL — ABNORMAL HIGH (ref 0.0–100.0)

## 2021-12-25 MED ORDER — TAMSULOSIN HCL 0.4 MG PO CAPS
0.8000 mg | ORAL_CAPSULE | Freq: Every day | ORAL | Status: DC
  Administered 2021-12-26 – 2021-12-28 (×3): 0.8 mg via ORAL
  Filled 2021-12-25 (×3): qty 2

## 2021-12-25 MED ORDER — IPRATROPIUM-ALBUTEROL 0.5-2.5 (3) MG/3ML IN SOLN
3.0000 mL | RESPIRATORY_TRACT | Status: AC
  Administered 2021-12-25 (×3): 3 mL via RESPIRATORY_TRACT
  Filled 2021-12-25: qty 3
  Filled 2021-12-25: qty 6

## 2021-12-25 MED ORDER — GUAIFENESIN-DM 100-10 MG/5ML PO SYRP
5.0000 mL | ORAL_SOLUTION | ORAL | Status: DC | PRN
  Administered 2021-12-26 – 2021-12-27 (×5): 5 mL via ORAL
  Filled 2021-12-25 (×5): qty 5

## 2021-12-25 MED ORDER — POTASSIUM CHLORIDE CRYS ER 20 MEQ PO TBCR
40.0000 meq | EXTENDED_RELEASE_TABLET | Freq: Once | ORAL | Status: AC
Start: 2021-12-25 — End: 2021-12-25
  Administered 2021-12-25: 40 meq via ORAL
  Filled 2021-12-25: qty 2

## 2021-12-25 MED ORDER — METHYLPREDNISOLONE SODIUM SUCC 125 MG IJ SOLR
125.0000 mg | Freq: Once | INTRAMUSCULAR | Status: AC
Start: 2021-12-25 — End: 2021-12-25
  Administered 2021-12-25: 125 mg via INTRAVENOUS
  Filled 2021-12-25: qty 2

## 2021-12-25 MED ORDER — TRAZODONE HCL 50 MG PO TABS
50.0000 mg | ORAL_TABLET | Freq: Every day | ORAL | Status: DC
Start: 2021-12-25 — End: 2021-12-25

## 2021-12-25 MED ORDER — WARFARIN - PHARMACIST DOSING INPATIENT: Freq: Every day | Status: DC

## 2021-12-25 MED ORDER — ATORVASTATIN CALCIUM 10 MG PO TABS
20.0000 mg | ORAL_TABLET | Freq: Every day | ORAL | Status: DC
  Administered 2021-12-25 – 2021-12-28 (×4): 20 mg via ORAL
  Filled 2021-12-25 (×4): qty 2

## 2021-12-25 MED ORDER — CARVEDILOL 6.25 MG PO TABS
6.2500 mg | ORAL_TABLET | Freq: Two times a day (BID) | ORAL | Status: DC
  Administered 2021-12-25 – 2021-12-28 (×5): 6.25 mg via ORAL
  Filled 2021-12-25 (×2): qty 1
  Filled 2021-12-25: qty 2
  Filled 2021-12-25 (×3): qty 1

## 2021-12-25 MED ORDER — ACETAMINOPHEN 650 MG RE SUPP: 650.0000 mg | Freq: Four times a day (QID) | RECTAL | Status: DC | PRN

## 2021-12-25 MED ORDER — LORATADINE 10 MG PO TABS
10.0000 mg | ORAL_TABLET | Freq: Every day | ORAL | Status: DC
  Administered 2021-12-25 – 2021-12-28 (×4): 10 mg via ORAL
  Filled 2021-12-25 (×4): qty 1

## 2021-12-25 MED ORDER — POTASSIUM CHLORIDE CRYS ER 20 MEQ PO TBCR
40.0000 meq | EXTENDED_RELEASE_TABLET | Freq: Once | ORAL | Status: AC
  Administered 2021-12-25: 40 meq via ORAL
  Filled 2021-12-25: qty 2

## 2021-12-25 MED ORDER — ACETAMINOPHEN 325 MG PO TABS: 650.0000 mg | ORAL_TABLET | Freq: Four times a day (QID) | ORAL | Status: DC | PRN

## 2021-12-25 MED ORDER — WARFARIN SODIUM 5 MG PO TABS
5.0000 mg | ORAL_TABLET | Freq: Once | ORAL | Status: AC
Start: 2021-12-26 — End: 2021-12-26
  Administered 2021-12-26: 5 mg via ORAL
  Filled 2021-12-25: qty 1

## 2021-12-25 MED ORDER — FUROSEMIDE 10 MG/ML IJ SOLN
40.0000 mg | Freq: Two times a day (BID) | INTRAMUSCULAR | Status: AC
  Administered 2021-12-26: 40 mg via INTRAVENOUS
  Filled 2021-12-25 (×2): qty 4

## 2021-12-25 MED ORDER — FUROSEMIDE 10 MG/ML IJ SOLN
40.0000 mg | Freq: Once | INTRAMUSCULAR | Status: AC
  Administered 2021-12-25: 40 mg via INTRAVENOUS
  Filled 2021-12-25: qty 4

## 2021-12-25 MED ORDER — PANTOPRAZOLE SODIUM 40 MG PO TBEC
40.0000 mg | DELAYED_RELEASE_TABLET | Freq: Two times a day (BID) | ORAL | Status: DC
  Administered 2021-12-25 – 2021-12-28 (×6): 40 mg via ORAL
  Filled 2021-12-25 (×6): qty 1

## 2021-12-25 NOTE — Progress Notes (Signed)
ANTICOAGULATION CONSULT NOTE - Initial Consult  Pharmacy Consult for warfarin Indication: atrial fibrillation, MVR  No Known Allergies  Patient Measurements:    Vital Signs: Temp: 98.9 F (37.2 C) (06/22 1036) Temp Source: Oral (06/22 1036) BP: 119/66 (06/22 1426) Pulse Rate: 90 (06/22 1426)  Labs: Recent Labs    12/25/21 1044 12/25/21 1516  HGB 10.9*  --   HCT 38.4*  --   PLT 395  --   LABPROT  --  30.7*  INR  --  3.0*  CREATININE 1.72*  --     Estimated Creatinine Clearance: 38 mL/min (A) (by C-G formula based on SCr of 1.72 mg/dL (H)).   Medical History: Past Medical History:  Diagnosis Date   Acute GI bleeding 06/27/2021   Anemia 01/29/2016   Atrial fibrillation (HCC)    post op.  s/p dc-cv 04/2008. 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  a- Echo 07/2008 Ef 55% mild peroprosthetic MVR with High transmitral gradient (mean 14).  b- normal coronaries by cath 12/2007   BPH (benign prostatic hyperplasia)    Cardiac resynchronization therapy pacemaker (CRT-P) in place 06/22/2011   CHF (congestive heart failure) (HCC)    EF 35-40 % 2011 due to valvular disease and diastolic dysfunction   Chronic back pain    CKD (chronic kidney disease), stage III (HCC)    COVID-19 virus infection 07/24/2020   Tested positive on July 16, 2020   Dysphagia    with normal barium swallow 09/2008   Endocarditis    GERD (gastroesophageal reflux disease)    Heavy alcohol use    history   History of atrial flutter 03/13/2008   History of atrial flutter - resolved.    History of GI bleed    Hypertension    IV drug abuse (HCC)    history of   Lower GI bleed 01/2016   Memory impairment 12/24/2015   Pacemaker 2010    Assessment: 76 YOM presenting with leg pain and cough, hx mechanical mitral valve replacement and afib on warfarin PTA  with last dose taken 6/21, INR on admission 3  PTA dosing: 5mg  daily  Goal of Therapy:  INR 2.5-3.5 Monitor platelets by anticoagulation protocol: Yes   Plan:  Warfarin 5mg  PO x 1 today Daily INR, s/s bleeding  , PharmD Clinical Pharmacist ED Pharmacist Phone # [phone] 12/25/2021 5:44 PM

## 2021-12-25 NOTE — ED Notes (Signed)
[name] is sitting up at 45 degree angle. C/O having a cough with SOB that brought him in to be seen today. Pt states that he's been seen for this before and it is in his chart. [name] has a non productive cough and talking in complete sentences with oxygen saturation at 94%. 2+ Pitting edema to bilateral lower legs noted.

## 2021-12-25 NOTE — ED Provider Notes (Signed)
[facility] EMERGENCY DEPARTMENT Provider Note   CSN: [id number] Arrival date & time: 12/25/21  1028     History  Chief Complaint  Patient presents with   Leg Pain    [name] is a 76 y.o. male.  Patient with history of hypertension, CKD stage III, AV first-degree heart block, pacemaker implantation, CHF, history of IV drug use with bacterial endocarditis in 2009 who presents to the emergency department today with a chief complaint of shortness of breath, cough, and bilateral leg edema.  Patient states that he has had worsening shortness of breath and a productive cough that has been worsening over the last month.  Was recently seen for same and admitted for CHF exacerbation and discharged on 6/17. States that now is cough is so significant that he is unable to do ADLs without significant coughing fits. He states that he is unable to sleep lying flat and is regularly waking up short of breath. States that he has been taking his Lasix as prescribed and does not feel that his extremities are as swollen as they were at past admission. He is not on oxygen at home. He denies fevers, chills, chest pain, nausea, vomiting, or diarrhea.  The history is provided by the patient. No language interpreter was used.  Leg Pain      Home Medications Prior to Admission medications   Medication Sig Start Date End Date Taking? Authorizing Provider  acetaminophen (TYLENOL) 500 MG tablet Take 1,000 mg by mouth every 6 (six) hours as needed (for pain).    [provider]  atorvastatin (LIPITOR) 20 MG tablet TAKE 1 TABLET BY MOUTH EVERY DAY IN THE EVENING 12/19/21   [name], MD  carvedilol (COREG) 6.25 MG tablet Take 1 tablet (6.25 mg total) by mouth 2 (two) times daily with a meal. 12/16/20   Cato Mulligan, MD  ferrous sulfate 325 (65 FE) MG tablet Take 1 tablet (325 mg total) by mouth daily. 12/05/21 03/05/22  Timothy Lasso, MD  guaiFENesin-dextromethorphan  (ROBITUSSIN DM) 100-10 MG/5ML syrup Take 5 mLs by mouth every 4 (four) hours as needed for cough. 12/20/21   France Ravens, MD  pantoprazole (PROTONIX) 40 MG tablet TAKE 1 TABLET (40 MG TOTAL) BY MOUTH TWICE A DAY BEFORE MEALS Patient taking differently: Take 40 mg by mouth 2 (two) times daily before a meal. 10/15/21   [name], MD  polyethylene glycol (MIRALAX) 17 g packet Take 17 g by mouth daily as needed. Take as needed, no more than twice daily if experiencing constipation 12/05/21   Timothy Lasso, MD  tamsulosin [facility]) 0.4 MG CAPS capsule Take 2 capsules (0.8 mg total) by mouth daily after breakfast. 08/29/21   Demaio, Alexa, MD  torsemide (DEMADEX) 20 MG tablet Take 1 tablet (20 mg total) by mouth 2 (two) times daily. 12/06/21 03/06/22  Timothy Lasso, MD  warfarin (COUMADIN) 5 MG tablet Take 0.5 tablets (2.5 mg total) by mouth daily at 8 pm. Take 1/2 tablet of warfarin 5 mg tab on Saturday and Sunday. Follow-up in [facility] on Monday 12/08/2021 with Dr. Elie Confer. 12/05/21   Gaylan Gerold, DO      Allergies    Patient has no known allergies.    Review of Systems   Review of Systems  Respiratory:  Positive for cough, shortness of breath and wheezing.   All other systems reviewed and are negative.   Physical Exam Updated Vital Signs BP 119/66 (BP Location: Right Arm)  Pulse 90   Temp 98.9 F (37.2 C) (Oral)   Resp 17   SpO2 97%  Physical Exam Vitals and nursing note reviewed.  Constitutional:      General: He is not in acute distress.    Appearance: Normal appearance. He is normal weight. He is not ill-appearing, toxic-appearing or diaphoretic.  HENT:     Head: Normocephalic and atraumatic.  Neck:     Vascular: JVD present.  Cardiovascular:     Rate and Rhythm: Normal rate and regular rhythm.     Heart sounds: Normal heart sounds.  Pulmonary:     Effort: Pulmonary effort is normal. No respiratory distress.     Breath sounds: Wheezing and rales present.   Abdominal:     General: Abdomen is flat.     Palpations: Abdomen is soft.  Musculoskeletal:        General: Normal range of motion.     Cervical back: Normal range of motion.     Comments: 1+ pitting edema in bilateral lower extremities. No leg pain  Skin:    General: Skin is warm and dry.  Neurological:     General: No focal deficit present.     Mental Status: He is alert.  Psychiatric:        Mood and Affect: Mood normal.        Behavior: Behavior normal.     ED Results / Procedures / Treatments   Labs (all labs ordered are listed, but only abnormal results are displayed) Labs Reviewed  COMPREHENSIVE METABOLIC PANEL - Abnormal; Notable for the following components:      Result Value   Sodium 133 (*)    Potassium 3.2 (*)    Chloride 96 (*)    Glucose, Bld 114 (*)    Creatinine, Ser 1.72 (*)    Total Bilirubin 1.6 (*)    GFR, Estimated 40 (*)    All other components within normal limits  CBC WITH DIFFERENTIAL/PLATELET - Abnormal; Notable for the following components:   Hemoglobin 10.9 (*)    HCT 38.4 (*)    MCH 23.3 (*)    MCHC 28.4 (*)    RDW 29.2 (*)    Lymphs Abs 0.4 (*)    All other components within normal limits  BRAIN NATRIURETIC PEPTIDE - Abnormal; Notable for the following components:   B Natriuretic Peptide 366.8 (*)    All other components within normal limits  SARS CORONAVIRUS 2 BY RT PCR  PATHOLOGIST SMEAR REVIEW  PROTIME-INR    EKG EKG Interpretation  Date/Time:  Thursday December 25 2021 14:15:02 EDT Ventricular Rate:  90 PR Interval:    QRS Duration: 90 QT Interval:  418 QTC Calculation: 511 R Axis:   86 Text Interpretation: Atrial fibrillation with premature ventricular or aberrantly conducted complexes Prolonged QT Abnormal ECG When compared with ECG of 19-Dec-2021 11:24, PREVIOUS ECG IS PRESENT No significant change since last tracing Confirmed by Derwood Kaplan ([id number]) on 12/25/2021 2:55:28 PM  Radiology DG Chest 2 View  Result Date:  12/25/2021 CLINICAL DATA:  cough, shortness of breath EXAM: CHEST - 2 VIEW COMPARISON:  December 19, 2021 FINDINGS: Stable left subclavian dual lead pacemaker. Status post mitral valve prosthesis, stable. Moderate cardiomegaly. Elevation of the left hemidiaphragm. Low lung volumes. Pulmonary vascular congestion with perihilar and bibasilar interstitial opacities and have decreased in the interim. No focal consolidation. Small right pleural effusion. The visualized skeletal structures are unremarkable. IMPRESSION: Findings of congestive heart failure and have improved in the  interim. Low lung volumes. Cardiomegaly. Electronically Signed   By: Marjo Bicker M.D.   On: 12/25/2021 13:52    Procedures Procedures    Medications Ordered in ED Medications  ipratropium-albuterol (DUONEB) 0.5-2.5 (3) MG/3ML nebulizer solution 3 mL (3 mLs Nebulization Given 12/25/21 1514)  furosemide (LASIX) injection 40 mg (40 mg Intravenous Given 12/25/21 1516)  methylPREDNISolone sodium succinate (SOLU-MEDROL) 125 mg/2 mL injection 125 mg (125 mg Intravenous Given 12/25/21 1515)    ED Course/ Medical Decision Making/ A&P                           Medical Decision Making Amount and/or Complexity of Data Reviewed Labs: ordered. Radiology: ordered.  Risk Prescription drug management. Decision regarding hospitalization.   This patient presents to the ED for concern of shortness of breath and cough, this involves an extensive number of treatment options, and is a complaint that carries with it a high risk of complications and morbidity.   Co morbidities that complicate the patient evaluation  Hx CHF   Additional history obtained:  Additional history obtained from previous admission  Lab Tests:  I Ordered, and personally interpreted labs.  The pertinent results include:  Hemoglobin 10.9 consistent with baseline, Na 133, K 3.2, creatinine 1.72 unchanged from baseline. BNP 366.8 up from 238.8 1 week  ago.   Imaging Studies ordered:  I ordered imaging studies including CXR  I independently visualized and interpreted imaging which showed  Findings of congestive heart failure and have improved in the interim. Low lung volumes. Cardiomegaly. However, upon my review as well as my attending Dr. Rhunette Croft it appears that patients chest x-ray shows increasing vascular congestion from previous.   Cardiac Monitoring: / EKG:  The patient was maintained on a cardiac monitor.  I personally viewed and interpreted the cardiac monitored which showed an underlying rhythm of: no STEMI, unchanged from previous   Problem List / ED Course / Critical interventions / Medication management  I ordered medication including Lasix for CHF, duo-nebs and solu-medrol  for wheezing  Reevaluation of the patient after these medicines showed that the patient improved I have reviewed the patients home medicines and have made adjustments as needed   Test / Admission - Considered:  Patient presents today with worsening cough and shortness of breath. He was just admitted and discharged for CHF exacerbation and states that he feels worse today. He has been taking his Lasix as prescribed. CXR shows worsening CHF, BNP 366. O2 saturation in the low 90s when standing by his bed. He is unable to complete sentences without stopping to have hacking cough. Lungs sounds originally with some wheezing, given duonebs and solu-medrol for same with some improvement in lung sounds but no improvement in patients condition, he continues to cough. Patient also with JVD on exam. Therefore patient will need readmission for CHF exacerbation. Patient is understanding and in agreement.    This is a shared visit with supervising physician Dr. Rhunette Croft who has independently evaluated patient & provided guidance in evaluation/management/disposition, in agreement with care     Final Clinical Impression(s) / ED Diagnoses Final diagnoses:  Acute on  chronic congestive heart failure, unspecified heart failure type (HCC)  Chronic cough    Rx / DC Orders ED Discharge Orders     None         Vear Clock 12/27/21 0805    Derwood Kaplan, MD 01/03/22 1028

## 2021-12-25 NOTE — ED Notes (Signed)
Charge nurse advised of delay of pt transport; Initiated pt transport up to floor.

## 2021-12-25 NOTE — H&P (Cosign Needed)
Date: 12/25/2021               Patient Name:  [name] MRN: [id number]  DOB: 1945-09-16 Age / Sex: 76 y.o., male   PCP: Dellia Cloud, MD         Medical Service: Internal Medicine Teaching Service         Attending Physician: Dr. Mercie Eon, MD    First Contact: Dr. Dellis Filbert Pager: [phone]  Second Contact: Dr. Doran Stabler  Pager: [phone]       After Hours (After 5p/  First Contact Pager: [phone]  weekends / holidays): Second Contact Pager: [phone]   Chief Complaint: Leg pain, cough, SOB  History of Present Illness:   [name] is a 76 year old male living with HFrEF (35-40%) 2/2 nonischemic cardiomyopathy,  status post mechanical mitral valve replacement for endocarditis 2/2 IVDU on warfarin, permanent atrial fibrillation, AV block status post pacemaker, CKD 3B, possible dementia, who presents to the EMS due to coughing and shortness of breath.  Patient endorses worsening nonproductive cough that has been going on for a few weeks.  He is not sure about shortness of breath but his cough is his main concern.  He vaguely reports dyspnea on exertion.  He reports stuffy nose with rhinorrhea.  Denies fever or chills.  Denies any sick contacts in his family.  Denies dysphagia, odynophagia or airway obstruction symptoms.  Patient endorses persistent lower extremity edema up to bilateral thigh.  Also endorses tightening of his abdomen.  He is not sure about orthopnea because he sleeps on his side.  He states that his friend helps him with his medication.  Reported missing the p.m. dose of his diuretic about 3 times a week.  He does endorses nocturia.  I also called and talked to his friend, [name].  She said that she called ambulance because he was coughing and also had leg pain.  She did not think that he was short of breath.  She reports adherence to his medication and does not miss any doses.  Meds:  Current Meds  Medication Sig   acetaminophen (TYLENOL) 500  MG tablet Take 1,000 mg by mouth every 6 (six) hours as needed for moderate pain.   atorvastatin (LIPITOR) 20 MG tablet TAKE 1 TABLET BY MOUTH EVERY DAY IN THE EVENING (Patient taking differently: Take 20 mg by mouth daily.)   carvedilol (COREG) 6.25 MG tablet Take 1 tablet (6.25 mg total) by mouth 2 (two) times daily with a meal.   ferrous sulfate 325 (65 FE) MG tablet Take 1 tablet (325 mg total) by mouth daily.   guaiFENesin-dextromethorphan (ROBITUSSIN DM) 100-10 MG/5ML syrup Take 5 mLs by mouth every 4 (four) hours as needed for cough.   pantoprazole (PROTONIX) 40 MG tablet TAKE 1 TABLET (40 MG TOTAL) BY MOUTH TWICE A DAY BEFORE MEALS (Patient taking differently: Take 40 mg by mouth 2 (two) times daily before a meal.)   polyethylene glycol (MIRALAX) 17 g packet Take 17 g by mouth daily as needed. Take as needed, no more than twice daily if experiencing constipation (Patient taking differently: Take 17 g by mouth daily as needed for moderate constipation.)   tamsulosin (FLOMAX) 0.4 MG CAPS capsule Take 2 capsules (0.8 mg total) by mouth daily after breakfast.   torsemide (DEMADEX) 20 MG tablet Take 1 tablet (20 mg total) by mouth 2 (two) times daily.   warfarin (COUMADIN) 5 MG tablet Take 0.5 tablets (2.5 mg total) by  mouth daily at 8 pm. Take 1/2 tablet of warfarin 5 mg tab on Saturday and Sunday. Follow-up in [facility] on Monday 12/08/2021 with Dr. Elie Confer. (Patient taking differently: Take 2.5-5 mg by mouth daily at 8 pm. Take 2.5 mg on Saturday and Sunday and 5 mg on all other days.)     Allergies: Allergies as of 12/25/2021   (No Known Allergies)   Past Medical History:  Diagnosis Date   Acute GI bleeding 06/27/2021   Anemia 01/29/2016   Atrial fibrillation ([place])    post op.  s/p dc-cv 04/2008. previously on amiodarone   Atrial flutter (HCC)    atypical   AV block, 1st degree    .450 msec--progressive   Bacterial endocarditis    (due to IVDA) with subsequent  St. 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  a- Echo 07/2008 Ef 55% mild peroprosthetic MVR with High transmitral gradient (mean 14).  b- normal coronaries by cath 12/2007   BPH (benign prostatic hyperplasia)    Cardiac resynchronization therapy pacemaker (CRT-P) in place 06/22/2011   CHF (congestive heart failure) (HCC)    EF 35-40 % 2011 due to valvular disease and diastolic dysfunction   Chronic back pain    CKD (chronic kidney disease), stage III ([place])    COVID-19 virus infection 07/24/2020   Tested positive on July 16, 2020   Dysphagia    with normal barium swallow 09/2008   Endocarditis    GERD (gastroesophageal reflux disease)    Heavy alcohol use    history   History of atrial flutter 03/13/2008   History of atrial flutter - resolved.    History of GI bleed    Hypertension    IV drug abuse ([place])    history of   Lower GI bleed 01/2016   Memory impairment 12/24/2015   Pacemaker 2010    Family History:  Family History  Problem Relation Age of Onset   Coronary artery disease Mother    Cancer Father        GI   Cancer Brother        Lung     Social History:  Lives at home with his friend, [name] and grandchildren Denies current tobacco use.  He has quit smoking a long time ago. No alcohol use or drug use PCP: [name].  Review of Systems: A complete ROS was negative except as per HPI.   Physical Exam: Blood pressure 119/66, pulse 90, temperature 98.9 F (37.2 C), temperature source Oral, resp. rate 17, SpO2 97 %. Physical Exam Constitutional:      General: He is not in acute distress.    Appearance: He is not toxic-appearing.  HENT:     Head: Normocephalic.     Mouth/Throat:     Mouth: Mucous membranes are moist.     Pharynx: No oropharyngeal exudate or posterior oropharyngeal erythema.  Eyes:     General:        Right eye: No discharge.        Left eye: No discharge.     Conjunctiva/sclera: Conjunctivae normal.  Cardiovascular:     Rate and Rhythm: Normal rate and  regular rhythm.     Heart sounds: Normal heart sounds.     Comments: Trace to +1 edema bilateral lower extremity up to bilateral thigh posteriorly.  No pain to palpation or erythema. Positive JVD to mandible angle Pulmonary:     Effort: Pulmonary effort is normal. No respiratory distress.     Comments:  Very mild bibasilar crackles Abdominal:     General: Bowel sounds are normal. There is no distension.     Tenderness: There is no abdominal tenderness.  Skin:    General: Skin is warm.  Neurological:     Mental Status: He is alert. Mental status is at baseline.  Psychiatric:        Mood and Affect: Mood normal.        Behavior: Behavior normal.      EKG: personally reviewed my interpretation is A-fib with PVC.  No significant change from prior EKG  CXR: personally reviewed my interpretation is pulmonary vascular congestion.  Assessment & Plan by Problem: Principal Problem:   Acute exacerbation of CHF (congestive heart failure) (HCC) Active Problems:   Benign prostatic hyperplasia with nocturia   Stage 3b chronic kidney disease (CKD) (HCC)   Atrial fibrillation (HCC)   [name] is a 76 year old male living with HFrEF (35-40%) 2/2 nonischemic cardiomyopathy, status post mechanical mitral valve replacement on warfarin, permanent atrial fibrillation, AV block status post pacemaker, CKD 3B, possible dementia, who presents to the EMS due to coughing and shortness of breath, admitted for acute exacerbation of HFrEF.  Acute on chronic HFrEF exacerbation (EF 35-40% in 10/2021) Nonischemic cardiomyopathy Physical exam consistent with volume overload including LE edema, lungs crackle and JVD.  Elevated BNP.  Unsure of the trigger of his exacerbation, but likely secondary to medication nonadherence.  We will diurese him with IV Lasix 40 mg twice daily.  He puts out a 500 cc with the first dose of Lasix.  His dry weight is around 90 kg from last 2 admissions.  We may need to aim at a lower  dry weight. -IV Lasix 40 mg twice daily.  Strict I/O.  Daily weight -Not a candidate for ARB/ARNI due to CKD -Continue carvedilol -Will need to figure out a solution for home medication adherence.  Nonproductive cough  HF vs exacerbation vs GERD vs allergy vs post nasal drip.  No cough induced medications on his list. -Robitussin-DM -Loratadine -Pantoprazole  Leg pain Patient did not endorse leg pain during our interview.  He does have LE edema without evidence of DVT (no erythema or pain to palpation).  His Doppler was negative for DVT bilaterally in May.  He is also on warfarin for A-fib so the probability of DVT is low.  We will have PT/OT work with him. -Obtain ABI to rule out PAD  Iron deficiency anemia He did have history of GI bleed status post EGD in 2022, which showed a nonbleeding angiodysplastic lesion treated with APC.  Colonoscopy 2015 did not show active bleeding.  Pleuroscopy 2015 showed a oozing AVM in the proximal duodenum status post Hemoclip He received 4 doses of IV Ferrlecit in May.  Hemoglobin is improving. -Can consider given additional IV iron here but will hold off while diuresing.  Hypokalemia Replete potassium  Permanent A-fib Status post mechanical valve replacement -On warfarin -Rate control with Coreg  CKD 3B Baseline 1.7-1.8 -BMP in a.m.  Possible dementia Patient with memory issue and sundowning -Delirium precaution -Will order sitter and trazodone nightly as needed   BPH -Resume Flomax  Heart healthy diet Full code IVF: N/A DVT: Warfarin  Dispo: Admit patient to Observation with expected length of stay less than 2 midnights.  Signed: Doran Stabler, DO 12/25/2021, 5:52 PM  Pager: [phone] After 5pm on weekdays and 1pm on weekends: On Call pager: [phone]

## 2021-12-25 NOTE — ED Triage Notes (Signed)
Patient BIB [facility] from home with complaint of left leg pain and a cough that started a few days ago. Patient is alert, oriented, and in no apparent distress at this time.

## 2021-12-26 ENCOUNTER — Observation Stay (HOSPITAL_BASED_OUTPATIENT_CLINIC_OR_DEPARTMENT_OTHER): Payer: 59

## 2021-12-26 DIAGNOSIS — I482 Chronic atrial fibrillation, unspecified: Secondary | ICD-10-CM | POA: Diagnosis not present

## 2021-12-26 DIAGNOSIS — I5023 Acute on chronic systolic (congestive) heart failure: Secondary | ICD-10-CM | POA: Diagnosis not present

## 2021-12-26 DIAGNOSIS — R053 Chronic cough: Secondary | ICD-10-CM | POA: Diagnosis not present

## 2021-12-26 DIAGNOSIS — R52 Pain, unspecified: Secondary | ICD-10-CM | POA: Diagnosis not present

## 2021-12-26 DIAGNOSIS — N1832 Chronic kidney disease, stage 3b: Secondary | ICD-10-CM | POA: Diagnosis not present

## 2021-12-26 DIAGNOSIS — Z87891 Personal history of nicotine dependence: Secondary | ICD-10-CM

## 2021-12-26 LAB — CBC
HCT: 36.5 % — ABNORMAL LOW (ref 39.0–52.0)
Hemoglobin: 11 g/dL — ABNORMAL LOW (ref 13.0–17.0)
MCH: 24.3 pg — ABNORMAL LOW (ref 26.0–34.0)
MCHC: 30.1 g/dL (ref 30.0–36.0)
MCV: 80.8 fL (ref 80.0–100.0)
Platelets: 358 10*3/uL (ref 150–400)
RBC: 4.52 MIL/uL (ref 4.22–5.81)
RDW: 28.7 % — ABNORMAL HIGH (ref 11.5–15.5)
WBC: 5.7 10*3/uL (ref 4.0–10.5)
nRBC: 0 % (ref 0.0–0.2)

## 2021-12-26 LAB — PROTIME-INR
INR: 3.1 — ABNORMAL HIGH (ref 0.8–1.2)
Prothrombin Time: 31.8 seconds — ABNORMAL HIGH (ref 11.4–15.2)

## 2021-12-26 LAB — GLUCOSE, CAPILLARY: Glucose-Capillary: 142 mg/dL — ABNORMAL HIGH (ref 70–99)

## 2021-12-26 LAB — BASIC METABOLIC PANEL
Anion gap: 7 (ref 5–15)
BUN: 24 mg/dL — ABNORMAL HIGH (ref 8–23)
CO2: 25 mmol/L (ref 22–32)
Calcium: 9 mg/dL (ref 8.9–10.3)
Chloride: 103 mmol/L (ref 98–111)
Creatinine, Ser: 1.6 mg/dL — ABNORMAL HIGH (ref 0.61–1.24)
GFR, Estimated: 44 mL/min — ABNORMAL LOW (ref >60)
Glucose, Bld: 138 mg/dL — ABNORMAL HIGH (ref 70–99)
Potassium: 3.5 mmol/L (ref 3.5–5.1)
Sodium: 135 mmol/L (ref 135–145)

## 2021-12-26 LAB — PATHOLOGIST SMEAR REVIEW

## 2021-12-26 LAB — MAGNESIUM: Magnesium: 1.6 mg/dL — ABNORMAL LOW (ref 1.7–2.4)

## 2021-12-26 MED ORDER — FERROUS SULFATE 325 (65 FE) MG PO TABS
325.0000 mg | ORAL_TABLET | Freq: Every day | ORAL | Status: DC
  Administered 2021-12-27 – 2021-12-28 (×2): 325 mg via ORAL
  Filled 2021-12-26 (×2): qty 1

## 2021-12-26 MED ORDER — POTASSIUM CHLORIDE CRYS ER 20 MEQ PO TBCR
40.0000 meq | EXTENDED_RELEASE_TABLET | Freq: Two times a day (BID) | ORAL | Status: AC
  Administered 2021-12-26 – 2021-12-27 (×4): 40 meq via ORAL
  Filled 2021-12-26 (×4): qty 2

## 2021-12-26 MED ORDER — TORSEMIDE 20 MG PO TABS
20.0000 mg | ORAL_TABLET | Freq: Two times a day (BID) | ORAL | Status: DC
  Administered 2021-12-27 – 2021-12-28 (×3): 20 mg via ORAL
  Filled 2021-12-26 (×3): qty 1

## 2021-12-27 ENCOUNTER — Encounter: Payer: Self-pay | Admitting: *Deleted

## 2021-12-27 DIAGNOSIS — I5023 Acute on chronic systolic (congestive) heart failure: Secondary | ICD-10-CM | POA: Diagnosis not present

## 2021-12-27 LAB — BASIC METABOLIC PANEL
Anion gap: 10 (ref 5–15)
BUN: 35 mg/dL — ABNORMAL HIGH (ref 8–23)
CO2: 23 mmol/L (ref 22–32)
Calcium: 9.3 mg/dL (ref 8.9–10.3)
Chloride: 104 mmol/L (ref 98–111)
Creatinine, Ser: 1.65 mg/dL — ABNORMAL HIGH (ref 0.61–1.24)
GFR, Estimated: 42 mL/min — ABNORMAL LOW (ref >60)
Glucose, Bld: 107 mg/dL — ABNORMAL HIGH (ref 70–99)
Potassium: 4.2 mmol/L (ref 3.5–5.1)
Sodium: 137 mmol/L (ref 135–145)

## 2021-12-27 LAB — GLUCOSE, CAPILLARY: Glucose-Capillary: 169 mg/dL — ABNORMAL HIGH (ref 70–99)

## 2021-12-27 LAB — MAGNESIUM: Magnesium: 1.9 mg/dL (ref 1.7–2.4)

## 2021-12-27 LAB — PROTIME-INR
INR: 3.5 — ABNORMAL HIGH (ref 0.8–1.2)
Prothrombin Time: 34.9 seconds — ABNORMAL HIGH (ref 11.4–15.2)

## 2021-12-27 MED ORDER — BENZONATATE 100 MG PO CAPS
100.0000 mg | ORAL_CAPSULE | Freq: Two times a day (BID) | ORAL | Status: DC | PRN
Start: 2021-12-27 — End: 2021-12-28
  Administered 2021-12-27: 100 mg via ORAL
  Filled 2021-12-27: qty 1

## 2021-12-27 MED ORDER — WARFARIN SODIUM 2.5 MG PO TABS
2.5000 mg | ORAL_TABLET | Freq: Once | ORAL | Status: AC
Start: 2021-12-27 — End: 2021-12-27
  Administered 2021-12-27: 2.5 mg via ORAL
  Filled 2021-12-27: qty 1

## 2021-12-28 DIAGNOSIS — I5023 Acute on chronic systolic (congestive) heart failure: Secondary | ICD-10-CM | POA: Diagnosis not present

## 2021-12-28 DIAGNOSIS — N1832 Chronic kidney disease, stage 3b: Secondary | ICD-10-CM | POA: Diagnosis not present

## 2021-12-28 DIAGNOSIS — I482 Chronic atrial fibrillation, unspecified: Secondary | ICD-10-CM | POA: Diagnosis not present

## 2021-12-28 DIAGNOSIS — R053 Chronic cough: Secondary | ICD-10-CM | POA: Diagnosis not present

## 2021-12-28 LAB — BASIC METABOLIC PANEL
Anion gap: 10 (ref 5–15)
BUN: 37 mg/dL — ABNORMAL HIGH (ref 8–23)
CO2: 25 mmol/L (ref 22–32)
Calcium: 9.5 mg/dL (ref 8.9–10.3)
Chloride: 100 mmol/L (ref 98–111)
Creatinine, Ser: 1.8 mg/dL — ABNORMAL HIGH (ref 0.61–1.24)
GFR, Estimated: 38 mL/min — ABNORMAL LOW (ref >60)
Glucose, Bld: 108 mg/dL — ABNORMAL HIGH (ref 70–99)
Potassium: 4.3 mmol/L (ref 3.5–5.1)
Sodium: 135 mmol/L (ref 135–145)

## 2021-12-28 LAB — CBC
HCT: 40.8 % (ref 39.0–52.0)
Hemoglobin: 12.1 g/dL — ABNORMAL LOW (ref 13.0–17.0)
MCH: 23.9 pg — ABNORMAL LOW (ref 26.0–34.0)
MCHC: 29.7 g/dL — ABNORMAL LOW (ref 30.0–36.0)
MCV: 80.6 fL (ref 80.0–100.0)
Platelets: 428 10*3/uL — ABNORMAL HIGH (ref 150–400)
RBC: 5.06 MIL/uL (ref 4.22–5.81)
RDW: 27.9 % — ABNORMAL HIGH (ref 11.5–15.5)
WBC: 8.4 10*3/uL (ref 4.0–10.5)
nRBC: 0 % (ref 0.0–0.2)

## 2021-12-28 LAB — GLUCOSE, CAPILLARY: Glucose-Capillary: 116 mg/dL — ABNORMAL HIGH (ref 70–99)

## 2021-12-28 LAB — MAGNESIUM: Magnesium: 1.8 mg/dL (ref 1.7–2.4)

## 2021-12-28 LAB — PROTIME-INR
INR: 3.6 — ABNORMAL HIGH (ref 0.8–1.2)
Prothrombin Time: 35.2 seconds — ABNORMAL HIGH (ref 11.4–15.2)

## 2021-12-28 MED ORDER — WARFARIN SODIUM 2.5 MG PO TABS
2.5000 mg | ORAL_TABLET | Freq: Once | ORAL | Status: AC
Start: 2021-12-28 — End: 2021-12-28
  Administered 2021-12-28: 2.5 mg via ORAL
  Filled 2021-12-28: qty 1

## 2021-12-28 MED ORDER — WARFARIN SODIUM 2.5 MG PO TABS
2.5000 mg | ORAL_TABLET | Freq: Once | ORAL | Status: DC
Start: 2021-12-28 — End: 2021-12-28

## 2021-12-28 NOTE — Progress Notes (Signed)
CC: Hospital follow up  HPI:  [name] is a 76 y.o. with medical history as below presenting to [facility] for hospital follow up after acute CHF exacerbation.   Please see problem-based list for further details, assessments, and plans.  Past Medical History:  Diagnosis Date   Acute GI bleeding 06/27/2021   Anemia 01/29/2016   Atrial fibrillation (HCC)    post op.  s/p dc-cv 04/2008. 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  a- Echo 07/2008 Ef 55% mild peroprosthetic MVR with High transmitral gradient (mean 14).  b- normal coronaries by cath 12/2007   BPH (benign prostatic hyperplasia)    Cardiac resynchronization therapy pacemaker (CRT-P) in place 06/22/2011   CHF (congestive heart failure) (HCC)    EF 35-40 % 2011 due to valvular disease and diastolic dysfunction   Chronic back pain    CKD (chronic kidney disease), stage III (HCC)    COVID-19 virus infection 07/24/2020   Tested positive on July 16, 2020   Dysphagia    with normal barium swallow 09/2008   Endocarditis    GERD (gastroesophageal reflux disease)    Heavy alcohol use    history   History of atrial flutter 03/13/2008   History of atrial flutter - resolved.    History of GI bleed    Hypertension    IV drug abuse (HCC)    history of   Lower GI bleed 01/2016   Memory impairment 12/24/2015   Pacemaker 2010     Current Outpatient Medications (Cardiovascular):    atorvastatin (LIPITOR) 20 MG tablet, TAKE 1 TABLET BY MOUTH EVERY DAY IN THE EVENING (Patient taking differently: Take 20 mg by mouth daily.)   carvedilol (COREG) 6.25 MG tablet, Take 1 tablet (6.25 mg total) by mouth 2 (two) times daily with a meal.   torsemide (DEMADEX) 20 MG tablet, Take 1 tablet (20 mg total) by mouth 2 (two) times daily.  Current Outpatient Medications (Respiratory):     guaiFENesin-dextromethorphan (ROBITUSSIN DM) 100-10 MG/5ML syrup, Take 5 mLs by mouth every 4 (four) hours as needed for cough.  Current Outpatient Medications (Analgesics):    acetaminophen (TYLENOL) 500 MG tablet, Take 1,000 mg by mouth every 6 (six) hours as needed for moderate pain.  Current Outpatient Medications (Hematological):    ferrous sulfate 325 (65 FE) MG tablet, Take 1 tablet (325 mg total) by mouth daily.   warfarin (COUMADIN) 5 MG tablet, Take 0.5 tablets (2.5 mg total) by mouth daily at 8 pm. Take 1/2 tablet of warfarin 5 mg tab on Saturday and Sunday. Follow-up in [facility] on Monday 12/08/2021 with Dr. [name]. (Patient taking differently: Take 2.5-5 mg by mouth daily at 8 pm. Take 2.5 mg on Saturday and Sunday and 5 mg on all other days.)  Current Outpatient Medications (Other):    diclofenac Sodium (VOLTAREN) 1 % GEL, Apply 4 g topically 4 (four) times daily. Apply up to 4 times daily as needed for leg pain.   pantoprazole (PROTONIX) 40 MG tablet, TAKE 1 TABLET (40 MG TOTAL) BY MOUTH TWICE A DAY BEFORE MEALS (Patient taking differently: Take 40 mg by mouth 2 (two) times daily before a meal.)   polyethylene glycol (MIRALAX) 17 g packet, Take 17 g by mouth daily as needed. Take as needed, no  more than twice daily if experiencing constipation (Patient taking differently: Take 17 g by mouth daily as needed for moderate constipation.)   tamsulosin (FLOMAX) 0.4 MG CAPS capsule, Take 2 capsules (0.8 mg total) by mouth daily after breakfast.  Review of Systems:  Review of system negative unless stated in the problem list or HPI.    Physical Exam:  Vitals:   12/29/21 0955  BP: 119/70  Pulse: 96  Temp: 98.9 F (37.2 C)  TempSrc: Oral  SpO2: 100%  Weight: 179 lb 11.2 oz (81.5 kg)    Physical Exam General: NAD HENT: NCAT Lungs: CTAB, no wheeze, rhonchi or rales.  Cardiovascular: irregularly irregular heart, mechanical valve murmur heard, 2+ pulses in  all extremities. 1+ LE edema Abdomen: No TTP, normal bowel sounds MSK: No asymmetry or muscle atrophy, TTP of lower extremities on anterior surface.  Skin: no lesions noted on exposed skin Neuro: Alert and oriented x4. CN grossly intact Psych: Normal mood and normal affect   Assessment & Plan:   See Encounters Tab for problem based charting.  Patient discussed with Dr. Thersa Salt, MD

## 2021-12-29 ENCOUNTER — Telehealth: Payer: 59

## 2021-12-29 ENCOUNTER — Ambulatory Visit (INDEPENDENT_AMBULATORY_CARE_PROVIDER_SITE_OTHER): Payer: 59 | Admitting: Internal Medicine

## 2021-12-29 ENCOUNTER — Ambulatory Visit (INDEPENDENT_AMBULATORY_CARE_PROVIDER_SITE_OTHER): Payer: 59 | Admitting: Pharmacist

## 2021-12-29 VITALS — BP 119/70 | HR 96 | Temp 98.9°F | Wt 179.7 lb

## 2021-12-29 DIAGNOSIS — I5023 Acute on chronic systolic (congestive) heart failure: Secondary | ICD-10-CM | POA: Diagnosis not present

## 2021-12-29 DIAGNOSIS — I110 Hypertensive heart disease with heart failure: Secondary | ICD-10-CM

## 2021-12-29 DIAGNOSIS — E785 Hyperlipidemia, unspecified: Secondary | ICD-10-CM

## 2021-12-29 DIAGNOSIS — M79662 Pain in left lower leg: Secondary | ICD-10-CM

## 2021-12-29 DIAGNOSIS — Z8679 Personal history of other diseases of the circulatory system: Secondary | ICD-10-CM

## 2021-12-29 DIAGNOSIS — M79661 Pain in right lower leg: Secondary | ICD-10-CM

## 2021-12-29 DIAGNOSIS — I10 Essential (primary) hypertension: Secondary | ICD-10-CM

## 2021-12-29 DIAGNOSIS — Z87891 Personal history of nicotine dependence: Secondary | ICD-10-CM

## 2021-12-29 DIAGNOSIS — Z952 Presence of prosthetic heart valve: Secondary | ICD-10-CM

## 2021-12-29 DIAGNOSIS — I482 Chronic atrial fibrillation, unspecified: Secondary | ICD-10-CM | POA: Diagnosis not present

## 2021-12-29 DIAGNOSIS — M25561 Pain in right knee: Secondary | ICD-10-CM | POA: Diagnosis not present

## 2021-12-29 LAB — POCT INR: INR: 3.5 — AB (ref 2.0–3.0)

## 2021-12-29 MED ORDER — DICLOFENAC SODIUM 1 % EX GEL: 4.0000 g | Freq: Four times a day (QID) | CUTANEOUS | 2 refills | Status: DC

## 2021-12-29 NOTE — Progress Notes (Signed)
Anticoagulation Management [name] is a 76 y.o. male who reports to the clinic for monitoring of warfarin treatment.    Indication: atrial fibrillation and Mitral valve replacement, history of; long term current use of oral anticoagulant, warfarin. Target INR 2.5 - 3.5.    Duration: indefinite Supervising physician: Aldine Contes  Anticoagulation Clinic Visit History: Patient does not report signs/symptoms of bleeding or thromboembolism  Other recent changes: No diet, medications, lifestyle changes. Anticoagulation Episode Summary     Current INR goal:  2.5-3.5  TTR:  60.3 % (9.2 y)  Next INR check:  01/05/2022  INR from last check:  3.5 (12/29/2021)  Weekly max warfarin dose:    Target end date:  Indefinite  INR check location:  Anticoagulation Clinic  Preferred lab:    Send INR reminders to:  ANTICOAG IMP   Indications   History of atrial flutter [Z86.79] Long term (current) use of anticoagulants (Resolved) [Z79.01] ATRIAL FLUTTER (Resolved) [I48.92] H/O mitral valve replacement with mechanical valve [Z95.2]        Comments:          Anticoagulation Care Providers     Provider Role Specialty Phone number   Jolaine Artist, MD  Cardiology [phone]       No Known Allergies  Current Outpatient Medications:    acetaminophen (TYLENOL) 500 MG tablet, Take 1,000 mg by mouth every 6 (six) hours as needed for moderate pain., Disp: , Rfl:    atorvastatin (LIPITOR) 20 MG tablet, TAKE 1 TABLET BY MOUTH EVERY DAY IN THE EVENING (Patient taking differently: Take 20 mg by mouth daily.), Disp: 90 tablet, Rfl: 2   carvedilol (COREG) 6.25 MG tablet, Take 1 tablet (6.25 mg total) by mouth 2 (two) times daily with a meal., Disp: 180 tablet, Rfl: 3   ferrous sulfate 325 (65 FE) MG tablet, Take 1 tablet (325 mg total) by mouth daily., Disp: 90 tablet, Rfl: 0   guaiFENesin-dextromethorphan (ROBITUSSIN DM) 100-10 MG/5ML syrup, Take 5 mLs by mouth every 4 (four) hours as  needed for cough., Disp: 118 mL, Rfl: 0   pantoprazole (PROTONIX) 40 MG tablet, TAKE 1 TABLET (40 MG TOTAL) BY MOUTH TWICE A DAY BEFORE MEALS (Patient taking differently: Take 40 mg by mouth 2 (two) times daily before a meal.), Disp: 180 tablet, Rfl: 1   polyethylene glycol (MIRALAX) 17 g packet, Take 17 g by mouth daily as needed. Take as needed, no more than twice daily if experiencing constipation (Patient taking differently: Take 17 g by mouth daily as needed for moderate constipation.), Disp: 14 each, Rfl: 0   tamsulosin (FLOMAX) 0.4 MG CAPS capsule, Take 2 capsules (0.8 mg total) by mouth daily after breakfast., Disp: 180 capsule, Rfl: 3   torsemide (DEMADEX) 20 MG tablet, Take 1 tablet (20 mg total) by mouth 2 (two) times daily., Disp: 180 tablet, Rfl: 0   warfarin (COUMADIN) 5 MG tablet, Take 0.5 tablets (2.5 mg total) by mouth daily at 8 pm. Take 1/2 tablet of warfarin 5 mg tab on Saturday and Sunday. Follow-up in [facility] on Monday 12/08/2021 with Dr. Elie Confer. (Patient taking differently: Take 2.5-5 mg by mouth daily at 8 pm. Take 2.5 mg on Saturday and Sunday and 5 mg on all other days.), Disp: , Rfl:    diclofenac Sodium (VOLTAREN) 1 % GEL, Apply 4 g topically 4 (four) times daily. Apply up to 4 times daily as needed for leg pain., Disp: 350 g, Rfl: 2 Past Medical  History:  Diagnosis Date   Acute GI bleeding 06/27/2021   Anemia 01/29/2016   Atrial fibrillation (HCC)    post op.  s/p dc-cv 04/2008. 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  a- Echo 07/2008 Ef 55% mild peroprosthetic MVR with High transmitral gradient (mean 14).  b- normal coronaries by cath 12/2007   BPH (benign prostatic hyperplasia)    Cardiac resynchronization therapy pacemaker (CRT-P) in place 06/22/2011   CHF (congestive heart failure) (HCC)    EF  35-40 % 2011 due to valvular disease and diastolic dysfunction   Chronic back pain    CKD (chronic kidney disease), stage III (HCC)    COVID-19 virus infection 07/24/2020   Tested positive on July 16, 2020   Dysphagia    with normal barium swallow 09/2008   Endocarditis    GERD (gastroesophageal reflux disease)    Heavy alcohol use    history   History of atrial flutter 03/13/2008   History of atrial flutter - resolved.    History of GI bleed    Hypertension    IV drug abuse (HCC)    history of   Lower GI bleed 01/2016   Memory impairment 12/24/2015   Pacemaker 2010   Social History   Socioeconomic History   Marital status: Divorced    Spouse name: Not on file   Number of children: 9   Years of education: Not on file   Highest education level: Not on file  Occupational History   Occupation: retired    Comment: [employer]  Tobacco Use   Smoking status: Former    Years: 0.00    Types: Cigarettes   Smokeless tobacco: Never  Vaping Use   Vaping Use: Never used  Substance and Sexual Activity   Alcohol use: No    Alcohol/week: 0.0 standard drinks of alcohol   Drug use: No   Sexual activity: Not Currently  Other Topics Concern   Not on file  Social History Narrative   Current Social History 02/16/2019        Patient lives with family (baby's mama, daughter and 4 grandkids:  Two yo twins, 78 yo and 84 yo in a home which is 1 story. There are 4 steps with handrails up to the entrance the patient uses.       Patient's method of transportation is via family member (baby's mama).      The highest level of education was high school diploma.      The patient currently retired.      Identified important Relationships are "My Baby's [name], [name]."       Pets : None       Interests / Fun: Sitting outdoors watching the birds and planes."       Exercise riding a bike 5 miles 3 days/week      Current Stressors: The grandkids       Religious / Personal Beliefs: "I  believe in God."       [name], RN, BSN       Social Determinants of Health   Financial Resource Strain: Low Risk  (10/03/2021)   Overall Financial Resource Strain (CARDIA)    Difficulty of Paying Living Expenses: Not very hard  Food Insecurity: No Food Insecurity (11/28/2021)   Hunger Vital Sign  Worried About Programme researcher, broadcasting/film/video in the Last Year: Never true    Ran Out of Food in the Last Year: Never true  Transportation Needs: No Transportation Needs (11/28/2021)   PRAPARE - Administrator, Civil Service (Medical): No    Lack of Transportation (Non-Medical): No  Physical Activity: Sufficiently Active (10/03/2021)   Exercise Vital Sign    Days of Exercise per Week: 3 days    Minutes of Exercise per Session: 50 min  Stress: Not on file  Social Connections: Unknown (10/03/2021)   Social Connection and Isolation Panel [NHANES]    Frequency of Communication with Friends and Family: More than three times a week    Frequency of Social Gatherings with Friends and Family: More than three times a week    Attends Religious Services: Not on Marketing executive or Organizations: No    Attends Banker Meetings: Never    Marital Status: Not on file   Family History  Problem Relation Age of Onset   Coronary artery disease Mother    Cancer Father        GI   Cancer Brother        Lung    ASSESSMENT Recent Results: The most recent result is correlated with 35 mg per week: Lab Results  Component Value Date   INR 3.5 (A) 12/29/2021   INR 3.6 (H) 12/28/2021   INR 3.5 (H) 12/27/2021    Anticoagulation Dosing: Description   Take one (1) tablet by mouth, each day--EXCEPT ON MONDAYS and THURSDAYS, TAKE ONLY ONE-HALF (1/2) of your 5mg  peach-colored warfarin tablets on Mondays and Thursdays.      INR today: Therapeutic  PLAN Weekly dose was decreased by 14% to 30 mg per week  Patient Instructions  Patient instructed to take medications as  defined in the Anti-coagulation Track section of this encounter.  Patient instructed to take today's dose.  Patient instructed to take one (1) tablet by mouth, each day--EXCEPT ON MONDAYS and THURSDAYS, TAKE ONLY ONE-HALF (1/2) of your 5mg  peach-colored warfarin tablets on Mondays and Thursdays.  Patient verbalized understanding of these instructions.   Patient advised to contact clinic or seek medical attention if signs/symptoms of bleeding or thromboembolism occur.  Patient verbalized understanding by repeating back information and was advised to contact me if further medication-related questions arise. Patient was also provided an information handout.  Follow-up Return in 1 week (on 01/05/2022) for Follow up INR.  12-06-1981, PharmD, CPP  15 minutes spent face-to-face with the patient during the encounter. 50% of time spent on education, including signs/sx bleeding and clotting, as well as food and drug interactions with warfarin. 50% of time was spent on fingerprick POC INR sample collection,processing, results determination, and documentation in 04/03/2022.

## 2021-12-30 LAB — BMP8+ANION GAP
Anion Gap: 17 mmol/L (ref 10.0–18.0)
BUN/Creatinine Ratio: 21 (ref 10–24)
BUN: 38 mg/dL — ABNORMAL HIGH (ref 8–27)
CO2: 23 mmol/L (ref 20–29)
Calcium: 9.7 mg/dL (ref 8.6–10.2)
Chloride: 95 mmol/L — ABNORMAL LOW (ref 96–106)
Creatinine, Ser: 1.78 mg/dL — ABNORMAL HIGH (ref 0.76–1.27)
Glucose: 103 mg/dL — ABNORMAL HIGH (ref 70–99)
Potassium: 4.4 mmol/L (ref 3.5–5.2)
Sodium: 135 mmol/L (ref 134–144)
eGFR: 39 mL/min/{1.73_m2} — ABNORMAL LOW (ref >59)

## 2021-12-30 MED ORDER — ROSUVASTATIN CALCIUM 10 MG PO TABS: 10.0000 mg | ORAL_TABLET | Freq: Every day | ORAL | 11 refills | Status: AC

## 2021-12-30 NOTE — Assessment & Plan Note (Addendum)
Patient admitted to hospital for acute CHF exacerbation after non-adherence to his regimen. His dry weight appears to be around 176 lbs. He was 179 lbs during his visit and he was measured at 181 on discharge. On exam, he his had bibasilar rales and LE edema. He stated his breathing was better since admission to the hospital. Since his exacerbation was due to medication non-adherence, I believe we can achieve euvolemia with his torsemide 20 mg BID. Plan to continue that regimen and evaluate him in 1 week and increase to 40 mg BID if needed.  -Continue torsemide 20 mg BID, follow up in 1 week for adjustment -Patient will have follow up for INR at that time as well.   -We will repeat BMP this visit; BMP showed creatinine stable with renal fxn at CKDIIIb  level.

## 2022-01-01 NOTE — Progress Notes (Signed)
Internal Medicine Clinic Attending  Case discussed with Dr. Khan  At the time of the visit.  We reviewed the resident's history and exam and pertinent patient test results.  I agree with the assessment, diagnosis, and plan of care documented in the resident's note.  

## 2022-01-05 ENCOUNTER — Ambulatory Visit (INDEPENDENT_AMBULATORY_CARE_PROVIDER_SITE_OTHER): Payer: 59 | Admitting: Pharmacist

## 2022-01-05 DIAGNOSIS — Z7901 Long term (current) use of anticoagulants: Secondary | ICD-10-CM

## 2022-01-05 DIAGNOSIS — Z8679 Personal history of other diseases of the circulatory system: Secondary | ICD-10-CM

## 2022-01-05 DIAGNOSIS — Z952 Presence of prosthetic heart valve: Secondary | ICD-10-CM | POA: Diagnosis not present

## 2022-01-05 LAB — POCT INR: INR: 3.4 — AB (ref 2.0–3.0)

## 2022-01-05 NOTE — Progress Notes (Signed)
Anticoagulation Management [name] is a 76 y.o. male who reports to the clinic for monitoring of warfarin treatment.    Indication:  History of mitral valve replacement; long term current use of oral anticoagulant, warfarin. Target INR range:  2.5 - 3.5.   Duration: indefinite Supervising physician: Joni Reining  Anticoagulation Clinic Visit History: Patient does not report signs/symptoms of bleeding or thromboembolism  Other recent changes: No diet, medications, lifestyle changes identified.  Anticoagulation Episode Summary     Current INR goal:  2.5-3.5  TTR:  60.4 % (9.3 y)  Next INR check:  01/26/2022  INR from last check:  3.4 (01/05/2022)  Weekly max warfarin dose:    Target end date:  Indefinite  INR check location:  Anticoagulation Clinic  Preferred lab:    Send INR reminders to:  [facility]   Indications   History of atrial flutter [Z86.79] Long term (current) use of anticoagulants (Resolved) [Z79.01] ATRIAL FLUTTER (Resolved) [I48.92] H/O mitral valve replacement with mechanical valve [Z95.2]        Comments:          Anticoagulation Care Providers     Provider Role Specialty Phone number   Jolaine Artist, MD  Cardiology [phone]       No Known Allergies  Current Outpatient Medications:    acetaminophen (TYLENOL) 500 MG tablet, Take 1,000 mg by mouth every 6 (six) hours as needed for moderate pain., Disp: , Rfl:    carvedilol (COREG) 6.25 MG tablet, Take 1 tablet (6.25 mg total) by mouth 2 (two) times daily with a meal., Disp: 180 tablet, Rfl: 3   diclofenac Sodium (VOLTAREN) 1 % GEL, Apply 4 g topically 4 (four) times daily. Apply up to 4 times daily as needed for leg pain., Disp: 350 g, Rfl: 2   ferrous sulfate 325 (65 FE) MG tablet, Take 1 tablet (325 mg total) by mouth daily., Disp: 90 tablet, Rfl: 0   pantoprazole (PROTONIX) 40 MG tablet, TAKE 1 TABLET (40 MG TOTAL) BY MOUTH TWICE A DAY BEFORE MEALS (Patient taking differently: Take 40  mg by mouth 2 (two) times daily before a meal.), Disp: 180 tablet, Rfl: 1   polyethylene glycol (MIRALAX) 17 g packet, Take 17 g by mouth daily as needed. Take as needed, no more than twice daily if experiencing constipation (Patient taking differently: Take 17 g by mouth daily as needed for moderate constipation.), Disp: 14 each, Rfl: 0   rosuvastatin (CRESTOR) 10 MG tablet, Take 1 tablet (10 mg total) by mouth daily., Disp: 30 tablet, Rfl: 11   tamsulosin (FLOMAX) 0.4 MG CAPS capsule, Take 2 capsules (0.8 mg total) by mouth daily after breakfast., Disp: 180 capsule, Rfl: 3   torsemide (DEMADEX) 20 MG tablet, Take 1 tablet (20 mg total) by mouth 2 (two) times daily., Disp: 180 tablet, Rfl: 0   warfarin (COUMADIN) 5 MG tablet, Take 0.5 tablets (2.5 mg total) by mouth daily at 8 pm. Take 1/2 tablet of warfarin 5 mg tab on Saturday and Sunday. Follow-up in [facility] on Monday 12/08/2021 with Dr. Elie Confer. (Patient taking differently: Take 2.5-5 mg by mouth daily at 8 pm. Take 2.5 mg on Saturday and Sunday and 5 mg on all other days.), Disp: , Rfl:    guaiFENesin-dextromethorphan (ROBITUSSIN DM) 100-10 MG/5ML syrup, Take 5 mLs by mouth every 4 (four) hours as needed for cough. (Patient not taking: Reported on 01/05/2022), Disp: 118 mL, Rfl: 0 Past Medical History:  Diagnosis  Date   Acute GI bleeding 06/27/2021   Anemia 01/29/2016   Atrial fibrillation (HCC)    post op.  s/p dc-cv 04/2008. 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  a- Echo 07/2008 Ef 55% mild peroprosthetic MVR with High transmitral gradient (mean 14).  b- normal coronaries by cath 12/2007   BPH (benign prostatic hyperplasia)    Cardiac resynchronization therapy pacemaker (CRT-P) in place 06/22/2011   CHF (congestive heart failure) (HCC)    EF 35-40 % 2011 due to valvular  disease and diastolic dysfunction   Chronic back pain    CKD (chronic kidney disease), stage III (HCC)    COVID-19 virus infection 07/24/2020   Tested positive on July 16, 2020   Dysphagia    with normal barium swallow 09/2008   Endocarditis    GERD (gastroesophageal reflux disease)    Heavy alcohol use    history   History of atrial flutter 03/13/2008   History of atrial flutter - resolved.    History of GI bleed    Hypertension    IV drug abuse (HCC)    history of   Lower GI bleed 01/2016   Memory impairment 12/24/2015   Pacemaker 2010   Social History   Socioeconomic History   Marital status: Divorced    Spouse name: Not on file   Number of children: 9   Years of education: Not on file   Highest education level: Not on file  Occupational History   Occupation: retired    Comment: [employer]  Tobacco Use   Smoking status: Former    Years: 0.00    Types: Cigarettes   Smokeless tobacco: Never  Vaping Use   Vaping Use: Never used  Substance and Sexual Activity   Alcohol use: No    Alcohol/week: 0.0 standard drinks of alcohol   Drug use: No   Sexual activity: Not Currently  Other Topics Concern   Not on file  Social History Narrative   Current Social History 02/16/2019        Patient lives with family (baby's mama, daughter and 4 grandkids:  Two yo twins, 60 yo and 43 yo in a home which is 1 story. There are 4 steps with handrails up to the entrance the patient uses.       Patient's method of transportation is via family member (baby's mama).      The highest level of education was high school diploma.      The patient currently retired.      Identified important Relationships are "My Baby's [name], [name]."       Pets : None       Interests / Fun: Sitting outdoors watching the birds and planes."       Exercise riding a bike 5 miles 3 days/week      Current Stressors: The grandkids       Religious / Personal Beliefs: "I believe in God."       L.  Leward Quan, RN, BSN       Social Determinants of Health   Financial Resource Strain: Low Risk  (10/03/2021)   Overall Financial Resource Strain (CARDIA)    Difficulty of Paying Living Expenses: Not very hard  Food Insecurity: No Food Insecurity (11/28/2021)   Hunger Vital Sign  Worried About Programme researcher, broadcasting/film/video in the Last Year: Never true    Ran Out of Food in the Last Year: Never true  Transportation Needs: No Transportation Needs (11/28/2021)   PRAPARE - Administrator, Civil Service (Medical): No    Lack of Transportation (Non-Medical): No  Physical Activity: Sufficiently Active (10/03/2021)   Exercise Vital Sign    Days of Exercise per Week: 3 days    Minutes of Exercise per Session: 50 min  Stress: Not on file  Social Connections: Unknown (10/03/2021)   Social Connection and Isolation Panel [NHANES]    Frequency of Communication with Friends and Family: More than three times a week    Frequency of Social Gatherings with Friends and Family: More than three times a week    Attends Religious Services: Not on Marketing executive or Organizations: No    Attends Banker Meetings: Never    Marital Status: Not on file   Family History  Problem Relation Age of Onset   Coronary artery disease Mother    Cancer Father        GI   Cancer Brother        Lung    ASSESSMENT Recent Results: The most recent result is correlated with 30 mg per week: Lab Results  Component Value Date   INR 3.4 (A) 01/05/2022   INR 3.5 (A) 12/29/2021   INR 3.6 (H) 12/28/2021    Anticoagulation Dosing: Description   Take only one-half (1/2) of your 5mg  peach-colored warfarin tablet on Mondays, Tuesdays, Wednesdays and Fridays. All other days--Sundays, Thursdays and Saturdays, take one (1) of your 5mg  peach-colored warfarin tablets.      INR today: Therapeutic  PLAN Weekly dose was decreased by 17% to 25 mg per week  Patient Instructions  Patient instructed to  take medications as defined in the Anti-coagulation Track section of this encounter.  Patient instructed to take today's dose.  Patient instructed to take only one-half (1/2) of your 5mg  peach-colored warfarin tablet on Mondays, Tuesdays, Wednesdays and Fridays. All other days--Sundays, Thursdays and Saturdays, take one (1) of your 5mg  peach-colored warfarin tablets.  Patient verbalized understanding of these instructions.   Patient advised to contact clinic or seek medical attention if signs/symptoms of bleeding or thromboembolism occur.  Patient verbalized understanding by repeating back information and was advised to contact me if further medication-related questions arise. Patient was also provided an information handout.  Follow-up Return in 3 weeks (on 01/26/2022) for Follow up INR.  12-06-1979, PharmD, CPP  15 minutes spent face-to-face with the patient during the encounter. 50% of time spent on education, including signs/sx bleeding and clotting, as well as food and drug interactions with warfarin. 50% of time was spent on fingerprick POC INR sample collection,processing, results determination, and documentation in 10-01-1990.

## 2022-01-05 NOTE — Patient Instructions (Signed)
Patient instructed to take medications as defined in the Anti-coagulation Track section of this encounter.  Patient instructed to take today's dose.  Patient instructed to take only one-half (1/2) of your 5mg  peach-colored warfarin tablet on Mondays, Tuesdays, Wednesdays and Fridays. All other days--Sundays, Thursdays and Saturdays, take one (1) of your 5mg  peach-colored warfarin tablets.  Patient verbalized understanding of these instructions.

## 2022-01-08 NOTE — Progress Notes (Signed)
Evaluation and management procedures were performed by the Clinical Pharmacy Practitioner under my supervision and collaboration. I have reviewed the Practitioner's note and chart, and I agree with the management and plan as documented above. ° °

## 2022-01-12 ENCOUNTER — Ambulatory Visit: Payer: 59

## 2022-01-13 ENCOUNTER — Other Ambulatory Visit: Payer: Self-pay

## 2022-01-13 ENCOUNTER — Ambulatory Visit (INDEPENDENT_AMBULATORY_CARE_PROVIDER_SITE_OTHER): Payer: 59 | Admitting: Student

## 2022-01-13 ENCOUNTER — Encounter: Payer: Self-pay | Admitting: Student

## 2022-01-13 ENCOUNTER — Encounter: Payer: 59 | Admitting: Student

## 2022-01-13 VITALS — BP 115/70 | HR 66 | Temp 98.0°F | Ht 68.0 in | Wt 181.6 lb

## 2022-01-13 DIAGNOSIS — Z87891 Personal history of nicotine dependence: Secondary | ICD-10-CM

## 2022-01-13 DIAGNOSIS — I482 Chronic atrial fibrillation, unspecified: Secondary | ICD-10-CM | POA: Diagnosis not present

## 2022-01-13 DIAGNOSIS — I5023 Acute on chronic systolic (congestive) heart failure: Secondary | ICD-10-CM | POA: Diagnosis not present

## 2022-01-13 DIAGNOSIS — K4090 Unilateral inguinal hernia, without obstruction or gangrene, not specified as recurrent: Secondary | ICD-10-CM | POA: Diagnosis not present

## 2022-01-13 NOTE — Assessment & Plan Note (Signed)
Currently rate controlled Last INR check with Dr. [name] was 3.4 (Goal 2.5 - 3.5)  -Continue Coreg and warfarin

## 2022-01-13 NOTE — Patient Instructions (Addendum)
Mr. Imes,  It was a pleasure seeing you in the clinic today.  Here is a summary what we talked about:  1.  You are doing a good job of taking your heart medications.  Please make sure you take your water pill torsemide 20 mg twice daily.  2.  Continue follow-up with Dr. [name] for INR check.  3.  You have a Right inguinal hernia.  I am glad that is not causing any problem right now.  However I am worried about any potential bowel incarcerations in the future.  I will send you to a surgeon for recommendations.  Please return in 1 month,  Take care  Dr. Cyndie Chime

## 2022-01-13 NOTE — Progress Notes (Signed)
CC: 1 week for heart failure follow-up.  Lump in the right inguinal area.  HPI:  [name] is a 76 y.o. with past medical history of HFrEF, nonischemic cardiomyopathy, valvular A-fib on warfarin, ICD placement, CKD 3 who presents to the clinic for 1 week follow-up from his heart failure exacerbation.  Patient also reports a lump in the right inguinal area.  Please see problem based charting for detail  Past Medical History:  Diagnosis Date   Acute GI bleeding 06/27/2021   Anemia 01/29/2016   Atrial fibrillation (HCC)    post op.  s/p dc-cv 04/2008. 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  a- Echo 07/2008 Ef 55% mild peroprosthetic MVR with High transmitral gradient (mean 14).  b- normal coronaries by cath 12/2007   BPH (benign prostatic hyperplasia)    Cardiac resynchronization therapy pacemaker (CRT-P) in place 06/22/2011   CHF (congestive heart failure) (HCC)    EF 35-40 % 2011 due to valvular disease and diastolic dysfunction   Chronic back pain    CKD (chronic kidney disease), stage III (HCC)    COVID-19 virus infection 07/24/2020   Tested positive on July 16, 2020   Dysphagia    with normal barium swallow 09/2008   Endocarditis    GERD (gastroesophageal reflux disease)    Heavy alcohol use    history   History of atrial flutter 03/13/2008   History of atrial flutter - resolved.    History of GI bleed    Hypertension    IV drug abuse (HCC)    history of   Lower GI bleed 01/2016   Memory impairment 12/24/2015   Pacemaker 2010   Review of Systems:  per HPI  Physical Exam:  Vitals:   01/13/22 1530  BP: 115/70  Pulse: 66  Temp: 98 F (36.7 C)  TempSrc: Oral  SpO2: 100%  Weight: 181 lb 9.6 oz (82.4 kg)  Height: 5\' 8"  (1.727 m)   Physical Exam Constitutional:      General: He is not in acute distress.     Appearance: He is not ill-appearing.  HENT:     Head: Normocephalic.  Eyes:     General:        Right eye: No discharge.        Left eye: No discharge.     Conjunctiva/sclera: Conjunctivae normal.  Cardiovascular:     Rate and Rhythm: Normal rate and regular rhythm.     Comments: No JVD. Trace LE edema bilaterally Pulmonary:     Effort: Pulmonary effort is normal. No respiratory distress.     Breath sounds: Normal breath sounds. No wheezing.     Comments: Mild left lower lobe Rales heard. Abdominal:     General: Bowel sounds are normal.     Palpations: Abdomen is soft.     Comments: There is a large mass palpated in the right inguinal area.  Mass is nontender to palpation.  The overlying skin is normal.  The mass becomes more prominent with coughing.  Not reducible.  Musculoskeletal:        General: Normal range of motion.  Skin:    General: Skin is warm.  Neurological:     Mental Status: He is alert and oriented to person, place, and time.  Psychiatric:  Mood and Affect: Mood normal.       Assessment & Plan:   See Encounters Tab for problem based charting.  Acute exacerbation of CHF (congestive heart failure) (HCC) Patient is here for 1 week follow-up for his HFrEF exacerbation.  Patient reports adherence to his torsemide 20 mg twice daily.  Reports good urine output.  He denies shortness of breath or chest pain.  Said that his cough has resolved.  Patient appears euvolemic on physical exam.  He has trace LE edema.  Lungs are clear with mild Rales heard in the left lower lobe.  No JVD  Last BMP checked at last visit showed creatinine of 1.78, GFR 39 which is his baseline.  -Continue torsemide 20 mg twice daily -Continue Coreg 6.25 mg twice daily -His heart failure is secondary to nonischemic cardiomyopathy.  Given his low normal blood pressure and CKD 3, he is not on an ACE or ARB. -Last ICD check in May was normal. -Follow-up in 1 month  Right inguinal  hernia Patient report a large mass in the right inguinal area.  York Spaniel that has been going on for the last 1-2 months but he has been noticing the enlargement in the last 2 weeks.  He denies any pain to the area.  Denies any changes in the bowel habits.  Physical exam revealed a large mass in the right inguinal area.  The mass is nontender to palpation.  The overlying skin is normal.  The mass became more prominent with coughing and is not reducible.  This is likely right inguinal hernia.  Patient was seen in 2022 for similar issue.  Fortunately there was no signs of bowel incarceration but he is at risk for bowel ischemia with nonreducible inguinal hernia.  -Will place referral for general surgery for further evaluation  Atrial fibrillation (HCC) Currently rate controlled Last INR check with Dr. [name] was 3.4 (Goal 2.5 - 3.5)  -Continue Coreg and warfarin   Patient discussed with Dr. Heide Spark

## 2022-01-13 NOTE — Assessment & Plan Note (Addendum)
Patient is here for 1 week follow-up for his HFrEF exacerbation.  Patient reports adherence to his torsemide 20 mg twice daily.  Reports good urine output.  He denies shortness of breath or chest pain.  Said that his cough has resolved.  Patient appears euvolemic on physical exam.  He has trace LE edema.  Lungs are clear with mild Rales heard in the left lower lobe.  No JVD  Last BMP checked at last visit showed creatinine of 1.78, GFR 39 which is his baseline.  -Continue torsemide 20 mg twice daily -Continue Coreg 6.25 mg twice daily -His heart failure is secondary to nonischemic cardiomyopathy.  Given his low normal blood pressure and CKD 3, he is not on an ACE or ARB. -Last ICD check in May was normal. -Follow-up in 1 month

## 2022-01-13 NOTE — Assessment & Plan Note (Signed)
Patient report a large mass in the right inguinal area.  York Spaniel that has been going on for the last 1-2 months but he has been noticing the enlargement in the last 2 weeks.  He denies any pain to the area.  Denies any changes in the bowel habits.  Physical exam revealed a large mass in the right inguinal area.  The mass is nontender to palpation.  The overlying skin is normal.  The mass became more prominent with coughing and is not reducible.  This is likely right inguinal hernia.  Patient was seen in 2022 for similar issue.  Fortunately there was no signs of bowel incarceration but he is at risk for bowel ischemia with nonreducible inguinal hernia.  -Will place referral for general surgery for further evaluation

## 2022-01-14 NOTE — Progress Notes (Signed)
Internal Medicine Clinic Attending  Case discussed with Dr. Nguyen  At the time of the visit.  We reviewed the resident's history and exam and pertinent patient test results.  I agree with the assessment, diagnosis, and plan of care documented in the resident's note. 

## 2022-01-16 ENCOUNTER — Encounter: Payer: Self-pay | Admitting: Physical Therapy

## 2022-01-16 ENCOUNTER — Ambulatory Visit: Payer: 59 | Attending: Internal Medicine | Admitting: Physical Therapy

## 2022-01-16 ENCOUNTER — Other Ambulatory Visit: Payer: Self-pay

## 2022-01-16 DIAGNOSIS — M25562 Pain in left knee: Secondary | ICD-10-CM | POA: Insufficient documentation

## 2022-01-16 DIAGNOSIS — R2681 Unsteadiness on feet: Secondary | ICD-10-CM | POA: Insufficient documentation

## 2022-01-16 DIAGNOSIS — R600 Localized edema: Secondary | ICD-10-CM | POA: Diagnosis present

## 2022-01-16 NOTE — Therapy (Signed)
OUTPATIENT PHYSICAL THERAPY LOWER EXTREMITY EVALUATION   Patient Name: [name] MRN: [id number] DOB:07/19/1945, 76 y.o., male Today's Date: 01/16/2022   PT End of Session - 01/16/22 1310     Visit Number 1    Number of Visits --   1-2x/week   Date for PT Re-Evaluation 03/13/22    Authorization Type [employer] MCR - FOTO    PT Start Time 1130    PT Stop Time 1210    PT Time Calculation (min) 40 min             Past Medical History:  Diagnosis Date   Acute GI bleeding 06/27/2021   Anemia 01/29/2016   Atrial fibrillation (HCC)    post op.  s/p dc-cv 04/2008. 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  a- Echo 07/2008 Ef 55% mild peroprosthetic MVR with High transmitral gradient (mean 14).  b- normal coronaries by cath 12/2007   BPH (benign prostatic hyperplasia)    Cardiac resynchronization therapy pacemaker (CRT-P) in place 06/22/2011   CHF (congestive heart failure) (HCC)    EF 35-40 % 2011 due to valvular disease and diastolic dysfunction   Chronic back pain    CKD (chronic kidney disease), stage III (HCC)    COVID-19 virus infection 07/24/2020   Tested positive on July 16, 2020   Dysphagia    with normal barium swallow 09/2008   Endocarditis    GERD (gastroesophageal reflux disease)    Heavy alcohol use    history   History of atrial flutter 03/13/2008   History of atrial flutter - resolved.    History of GI bleed    Hypertension    IV drug abuse (HCC)    history of   Lower GI bleed 01/2016   Memory impairment 12/24/2015   Pacemaker 2010   Past Surgical History:  Procedure Laterality Date   BIOPSY  06/29/2021   Procedure: BIOPSY;  Surgeon: Meridee Score Netty Starring., MD;  Location: [facility] ENDOSCOPY;  Service: Gastroenterology;;   BIV PACEMAKER GENERATOR CHANGEOUT N/A 12/13/2017   Procedure: BIV PACEMAKER GENERATOR  CHANGEOUT;  Surgeon: Duke Salvia, MD;  Location: [facility] INVASIVE CV LAB;  Service: Cardiovascular;  Laterality: N/A;   CARDIAC VALVE REPLACEMENT     mitral valve, st jude mechanical    COLONOSCOPY N/A 03/26/2014   Procedure: COLONOSCOPY;  Surgeon: Rachael Fee, MD;  Location: [facility] ENDOSCOPY;  Service: Endoscopy;  Laterality: N/A;   ENTEROSCOPY N/A 03/28/2014   Procedure: ENTEROSCOPY;  Surgeon: Rachael Fee, MD;  Location: [facility] ENDOSCOPY;  Service: Endoscopy;  Laterality: N/A;   ENTEROSCOPY N/A 06/29/2021   Procedure: ENTEROSCOPY;  Surgeon: Meridee Score Netty Starring., MD;  Location: [facility] ENDOSCOPY;  Service: Gastroenterology;  Laterality: N/A;   ESOPHAGOGASTRODUODENOSCOPY N/A 03/25/2014   Procedure: ESOPHAGOGASTRODUODENOSCOPY (EGD);  Surgeon: Iva Boop, MD;  Location: [facility] ENDOSCOPY;  Service: Endoscopy;  Laterality: N/A;   GIVENS CAPSULE STUDY N/A 03/26/2014   Procedure: GIVENS CAPSULE STUDY;  Surgeon: Rachael Fee, MD;  Location: [facility] ENDOSCOPY;  Service: Endoscopy;  Laterality: N/A;   HOT HEMOSTASIS N/A 06/29/2021   Procedure: HOT HEMOSTASIS (ARGON PLASMA COAGULATION/BICAP);  Surgeon: Lemar Lofty., MD;  Location: [facility] ENDOSCOPY;  Service: Gastroenterology;  Laterality: N/A;   PACEMAKER INSERTION     inserted about 4-5 years ago   SUBMUCOSAL TATTOO INJECTION  06/29/2021   Procedure: SUBMUCOSAL TATTOO INJECTION;  Surgeon: Irving Copas., MD;  Location: [facility] ENDOSCOPY;  Service: Gastroenterology;;   Patient Active Problem List   Diagnosis Date Noted   Sundowning    Accident due to mechanical fall without injury    Hypokalemia 12/01/2021   Right knee pain 11/10/2021   Sleep disturbances 11/05/2021   Atrial fibrillation ([place]) 10/24/2021   Acute exacerbation of CHF (congestive heart failure) ([place]) 10/24/2021   Pre-diabetes 09/23/2021   TIA (transient ischemic attack) 09/15/2021   GI bleed 06/27/2021   Right inguinal hernia 04/21/2021   Chronic cough 02/25/2021   Lower leg  pain 02/12/2021   Stage 3b chronic kidney disease (CKD) ([place]) 03/26/2016   Dementia ([place]) 12/24/2015   H/O mitral valve replacement with mechanical valve 11/13/2015   Benign prostatic hyperplasia with nocturia 11/13/2015   Healthcare maintenance 11/13/2015   History of arteriovenous malformation (AVM) 03/28/2014   Chronic biventricular, systolic heart failure ([place]) 06/02/2011   Essential hypertension 09/09/2008   History of atrial flutter 03/13/2008    PCP: Lucious Groves, DO  REFERRING PROVIDER: Lottie Mussel, MD  THERAPY DIAG:  Unsteadiness on feet  Left knee pain, unspecified chronicity  Localized edema  REFERRING DIAG: unsteadiness on feet  Rationale for Evaluation and Treatment Rehabilitation  SUBJECTIVE:  PERTINENT PAST HISTORY:  Inguinal hernia, CHF, a fib, mechanical mitral valve replacement, dementia, anti-coagulant therapy      PRECAUTIONS: defibrillator   WEIGHT BEARING RESTRICTIONS No  FALLS:  Has patient fallen in last 6 months? No, Number of falls: 0  MOI/History of condition:  Onset date: 3 weeks ago  BRIDGE MARZ is a 76 y.o. male who presents to clinic with chief complaint of L knee pain and instability in gait.  He was recently hospitalized for acute exacerbation of CHF.  He was referred for balance but his main concern is L knee pain which started with no known trauma.  It makes it difficult for him to walk.  He is a somewhat unreliable narrator, but reports no history of falls.  He sleeps in his bed on his side with no SOB.  From referring provider:   "[name] is a 76 year old man wo was admitted 12/25/21 for acute exacerbation of CHF. Pt chief complaints are L LE "muscle pain," cough, and SOB. PMH: CHF, a fib, mechanical mitral valve replacement, CKD, BPH, HTN"   Red flags:  denies   Pain:  Are you having pain? Yes Pain location: diffuse L knee pain NPRS scale:  current 2/10  average 5/10  Aggravating factors: walking,  activity  NPRS, highest: 9/10 Relieving factors: rest  NPRS: best: 0/10 Pain description: intermittent and sharp Stage: Subacute Stability: staying the same 24 hour pattern: no clear pattern   Occupation: Retried  Administrator, sports: SPC, rollator  Hand Dominance: NA  Patient Goals/Specific Activities: improve L knee pain   OBJECTIVE:   DIAGNOSTIC FINDINGS:  NA   GENERAL OBSERVATION/GAIT:   Slow unstable and antalgic gait with forward trunk flexion.  Reduced time in stance on L LE  SENSATION:  Light touch: Appears intact  PALPATION: Exquisite tenderness L MCL with mild pitting edema bil LE  LE MMT:  MMT Right 01/16/2022 Left 01/16/2022  Hip flexion (L2, L3)    Knee extension (L3)    Knee flexion    Hip abduction    Hip extension    Hip external rotation    Hip internal rotation    Hip adduction  Ankle dorsiflexion (L4)    Ankle plantarflexion (S1)    Ankle inversion    Ankle eversion    Great Toe ext (L5)    Grossly 3+ 3+   (Blank rows = not tested, score listed is out of 5 possible points.  N = WNL, D = diminished, C = clear for gross weakness with myotome testing, * = concordant pain with testing)  L knee lacking ~10 degrees ext  Functional Tests  Eval (01/16/2022)    BERG BALANCE  Sitting to Standing: Numbers; 0-4: 3  4. Stands without using hands and stabilize independently  3. Stands independently using hands  2. Stands using hands after multiple trials  1. Min A to stand  0. Mod-Max A to stand Standing unsupported: Numbers; 0-4: 3  4. Stands safely for 2 minutes  3. Stands 2 minutes with supervision  2. Stands 30 seconds unsupported  1. Needs several tries to stand unsupported for 30 seconds  0. Unable to stand unsupported for 30 seconds Sitting unsupported: Numbers; 0-4: 4  4. Sits for 2 minutes independently  3. Sits for 2 minutes with supervision  2. Able to sit 30 seconds  1. Able to sit 10 seconds  0. Unable to sit for 10  seconds Standing to Sitting: Numbers; 0-4: 3 4. Sits safely with minimal use of hands 3. Controls descent with hands 2. Uses back of legs against chair to control descent 1. Sits independently, but uncontrolled descent 0. Needs assistance Transfers: Numbers; 0-4: 3  4. Transfers safely with minor use of hands  3. Transfers safely definite use of hands  2. Transfers with verbal cueing/supervision  1. Needs 1 person assist  0. Needs 2 person assist  Standing with eyes closed: Numbers; 0-4: 4  4. Stands safely for 10 seconds  3. Stands 10 seconds with supervision   2. Able to stand for 3 seconds  1. Unable to keep eyes closed for 3 seconds, but is safe  0. Needs assist to keep from falling Standing with feet together: Numbers; 0-4: 4 4. Stands for 1 minute safely 3. Stands for 1 minute with supervision 2. Unable to hold for 30 seconds  1. Needs help to attain position but can hold for 15 seconds  0. Needs help to attain position and unable to hold for 15 seconds Reaching forward with outstretched arm: Numbers; 0-4: 3  4. Reaches forward 10 inches  3. Reaches forward 5 inches  2. Reaches forward 2 inches  1. Reaches forward with supervision  0. Loses balance/requires assistace Retrieving object from the floor: Numbers; 0-4: 4 4. Able to pick up easily and safely 3. Able to pick up with supervision 2. Unable to pick up, but reaches within 1-2 inches independently 1. Unable to pick up and needs supervision 0. Unable/needs assistance to keep from falling  Turning to look behind: Numbers; 0-4: 2  4. Looks behind from both sides and weight shifts well  3. Looks behind one side only, other side less weight shift  2. Turns sideways only, maintains balance  1. Needs supervision when turning  0. Needs assistance  Turning 360 degrees: Numbers; 0-4: 1  4. Able to turn in </=4 seconds  3. Able to turn on one side in </= 4 seconds   2. Able to turn slowly, but safely  1. Needs  supervision or verbal cueing  0. Needs assistance Place alternate foot on stool: Numbers; 0-4: 0 4. Completes 8 steps in 20 seconds 3. Completes 8  steps in >20 seconds 2. 4 steps without assistance/supervision 1. Completes >2 steps with minimal assist 0. Unable, needs assist to keep from falling Standing with one foot in front: Numbers; 0-4: 1  4. Independent tandem for 30 seconds  3. Independent foot ahead for 30 seconds  2. Independent small step for 30 seconds  1. Needs help to step, but can hold for 15 seconds  0. Loses balance while standing/stepping Standing on one foot: Numbers; 0-4: 0 4. Holds >10 seconds 3. Holds 5-10 seconds 2. Holds >/=3 seconds  1. Holds <3 seconds 0. Unable   Total Score: 35/56    TUG w/ cane: 20''                                                     PATIENT SURVEYS:  FOTO 46 -> 58  SPECIAL TESTS:  MCL structure testing painful but stable  TODAY'S TREATMENT: Creating, reviewing, and completing below HEP  PATIENT EDUCATION:  POC, diagnosis, prognosis, HEP, and outcome measures.  Pt educated via explanation, demonstration, and handout (HEP).  Pt confirms understanding verbally.   HOME EXERCISE PROGRAM: Access Code: [email or web address] URL: [email or web address] Date: 01/16/2022 Prepared by: Shearon Balo  Exercises - Seated March  - 1 x daily - 7 x weekly - 3 sets - 10 reps - Seated Long Arc Quad  - 1 x daily - 7 x weekly - 3 sets - 10 reps - Seated Heel Raise  - 1 x daily - 7 x weekly - 3 sets - 10 reps  ASTERISK SIGNS   Asterisk Signs Eval (01/16/2022)       Tandem and semi tandem unable       Tug w/ cane 20''       LE MMT grossly 3+                          ASSESSMENT:  CLINICAL IMPRESSION: Sloane is a 76 y.o. male who presents to clinic with signs and sxs consistent with deconditioning and generalized weakness with concomitant balance deficits and high fall risk with concurrent L medial knee pain.  L knee pain  is consistent with MCL strain/sprain.  Pt is inconsistent historian but adamantly denies fall; he say he could have hit his knee on an object possibly (but cannot recall incident).  MCL appears stable on exam but is exquisitely tender.  OBJECTIVE IMPAIRMENTS: Pain, knee ROM, balance, gait, LE strength  ACTIVITY LIMITATIONS: ambulation in community, bending, lifting, squatting, transfers  PERSONAL FACTORS: See medical history and pertinent history   REHAB POTENTIAL: Good  CLINICAL DECISION MAKING: Stable/uncomplicated  EVALUATION COMPLEXITY: Low   GOALS:   SHORT TERM GOALS: Target date: 02/06/2022  Mays will be >75% HEP compliant to improve carryover between sessions and facilitate independent management of condition  Evaluation (01/16/2022): ongoing Goal status: INITIAL   LONG TERM GOALS: Target date: 03/13/2022  Saturnino will imporve BERG balance score to 42 pts, to show a significant improvement in balance in order to reduce fall risk  Evaluation/Baseline (01/16/2022): 35 pts Goal status: INITIAL  See interpretation below:      2.  Derrico will lower TUG score (MCID 3.4'') to <=15'' to show reduced risk of falls   Evaluation/Baseline (01/16/2022): 20'' Goal status: INITIAL   3.  Desten will improve FOTO score to  58 as a proxy for functional improvement  Evaluation/Baseline (01/16/2022): 46 Goal status: INITIAL   4.  Fernie will self report >/= 50% decrease in pain from evaluation   Evaluation/Baseline (01/16/2022): 9/10 max pain Goal status: INITIAL    PLAN: PT FREQUENCY: 1-2x/week  PT DURATION: 8 weeks (Ending 03/13/2022)  PLANNED INTERVENTIONS: Therapeutic exercises, Aquatic therapy, Therapeutic activity, Neuro Muscular re-education, Gait training, Patient/Family education, Joint mobilization, Dry Needling, Spinal mobilization and/or manipulation, Moist heat, Taping, Vasopneumatic device, Ionotophoresis 4mg /ml Dexamethasone, and Manual therapy  PLAN FOR NEXT  SESSION: progressive knee strengthening, balance, gait, general endurance    PT, DPT 01/16/2022, 1:25 PM

## 2022-01-26 ENCOUNTER — Ambulatory Visit (INDEPENDENT_AMBULATORY_CARE_PROVIDER_SITE_OTHER): Payer: 59 | Admitting: Pharmacist

## 2022-01-26 DIAGNOSIS — Z952 Presence of prosthetic heart valve: Secondary | ICD-10-CM | POA: Diagnosis not present

## 2022-01-26 DIAGNOSIS — Z8679 Personal history of other diseases of the circulatory system: Secondary | ICD-10-CM

## 2022-01-26 LAB — POCT INR: INR: 3.3 — AB (ref 2.0–3.0)

## 2022-01-26 NOTE — Patient Instructions (Signed)
Patient instructed to take medications as defined in the Anti-coagulation Track section of this encounter.  Patient instructed to take today's dose.  Patient instructed to take only one-half (1/2) of your 5mg  peach-colored warfarin tablet on Mondays, Tuesdays, Wednesdays, Thursdays and Fridays. All other days--Sundays, and Saturdays, take one (1) of your 5mg  peach-colored warfarin tablets.  Patient verbalized understanding of these instructions.

## 2022-01-26 NOTE — Progress Notes (Signed)
Anticoagulation Management [name] is a 76 y.o. male who reports to the clinic for monitoring of warfarin treatment.    Indication:  History of atrial flutter; History of mechanical mitral valve replacement surgery; long term current use of oral anticoagulant, warfarin. Target INR 2.5 - 3.5.   Duration: indefinite Supervising physician:  Mercie Eon, MD  Anticoagulation Clinic Visit History: Patient does not report signs/symptoms of bleeding or thromboembolism  Other recent changes: No diet, medications, lifestyle Anticoagulation Episode Summary     Current INR goal:  2.5-3.5  TTR:  60.7 % (9.3 y)  Next INR check:  02/23/2022  INR from last check:  3.3 (01/26/2022)  Weekly max warfarin dose:    Target end date:  Indefinite  INR check location:  Anticoagulation Clinic  Preferred lab:    Send INR reminders to:  ANTICOAG IMP   Indications   History of atrial flutter [Z86.79] Long term (current) use of anticoagulants (Resolved) [Z79.01] ATRIAL FLUTTER (Resolved) [I48.92] H/O mitral valve replacement with mechanical valve [Z95.2]        Comments:          Anticoagulation Care Providers     Provider Role Specialty Phone number   [name], MD  Cardiology [phone]       No Known Allergies  Current Outpatient Medications:    carvedilol (COREG) 6.25 MG tablet, Take 1 tablet (6.25 mg total) by mouth 2 (two) times daily with a meal., Disp: 180 tablet, Rfl: 3   diclofenac Sodium (VOLTAREN) 1 % GEL, Apply 4 g topically 4 (four) times daily. Apply up to 4 times daily as needed for leg pain., Disp: 350 g, Rfl: 2   ferrous sulfate 325 (65 FE) MG tablet, Take 1 tablet (325 mg total) by mouth daily., Disp: 90 tablet, Rfl: 0   pantoprazole (PROTONIX) 40 MG tablet, TAKE 1 TABLET (40 MG TOTAL) BY MOUTH TWICE A DAY BEFORE MEALS (Patient taking differently: Take 40 mg by mouth 2 (two) times daily before a meal.), Disp: 180 tablet, Rfl: 1   rosuvastatin (CRESTOR) 10 MG  tablet, Take 1 tablet (10 mg total) by mouth daily., Disp: 30 tablet, Rfl: 11   tamsulosin (FLOMAX) 0.4 MG CAPS capsule, Take 2 capsules (0.8 mg total) by mouth daily after breakfast., Disp: 180 capsule, Rfl: 3   torsemide (DEMADEX) 20 MG tablet, Take 1 tablet (20 mg total) by mouth 2 (two) times daily., Disp: 180 tablet, Rfl: 0   warfarin (COUMADIN) 5 MG tablet, Take 0.5 tablets (2.5 mg total) by mouth daily at 8 pm. Take 1/2 tablet of warfarin 5 mg tab on Saturday and Sunday. Follow-up in [facility] on Monday 12/08/2021 with Dr. [name]. (Patient taking differently: Take 2.5-5 mg by mouth daily at 8 pm. Take one-half (1/2) of your 5mg  peach-colored warfarin tablet, Monday, Tuesday, Wednesday, Thursday and Friday. On Saturday and Sunday, take one (1) tablet.), Disp: , Rfl:    acetaminophen (TYLENOL) 500 MG tablet, Take 1,000 mg by mouth every 6 (six) hours as needed for moderate pain. (Patient not taking: Reported on 01/26/2022), Disp: , Rfl:    guaiFENesin-dextromethorphan (ROBITUSSIN DM) 100-10 MG/5ML syrup, Take 5 mLs by mouth every 4 (four) hours as needed for cough. (Patient not taking: Reported on 01/05/2022), Disp: 118 mL, Rfl: 0   polyethylene glycol (MIRALAX) 17 g packet, Take 17 g by mouth daily as needed. Take as needed, no more than twice daily if experiencing constipation (Patient not taking: Reported on 01/26/2022),  Disp: 14 each, Rfl: 0 Past Medical History:  Diagnosis Date   Acute GI bleeding 06/27/2021   Anemia 01/29/2016   Atrial fibrillation (HCC)    post op.  s/p dc-cv 04/2008. 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  a- Echo 07/2008 Ef 55% mild peroprosthetic MVR with High transmitral gradient (mean 14).  b- normal coronaries by cath 12/2007   BPH (benign prostatic hyperplasia)    Cardiac resynchronization therapy  pacemaker (CRT-P) in place 06/22/2011   CHF (congestive heart failure) (HCC)    EF 35-40 % 2011 due to valvular disease and diastolic dysfunction   Chronic back pain    CKD (chronic kidney disease), stage III (HCC)    COVID-19 virus infection 07/24/2020   Tested positive on July 16, 2020   Dysphagia    with normal barium swallow 09/2008   Endocarditis    GERD (gastroesophageal reflux disease)    Heavy alcohol use    history   History of atrial flutter 03/13/2008   History of atrial flutter - resolved.    History of GI bleed    Hypertension    IV drug abuse (HCC)    history of   Lower GI bleed 01/2016   Memory impairment 12/24/2015   Pacemaker 2010   Social History   Socioeconomic History   Marital status: Divorced    Spouse name: Not on file   Number of children: 9   Years of education: Not on file   Highest education level: Not on file  Occupational History   Occupation: retired    Comment: [employer]  Tobacco Use   Smoking status: Former    Years: 0.00    Types: Cigarettes   Smokeless tobacco: Never  Vaping Use   Vaping Use: Never used  Substance and Sexual Activity   Alcohol use: No    Alcohol/week: 0.0 standard drinks of alcohol   Drug use: No   Sexual activity: Not Currently  Other Topics Concern   Not on file  Social History Narrative   Current Social History 02/16/2019        Patient lives with family (baby's mama, daughter and 4 grandkids:  Two yo twins, 85 yo and 25 yo in a home which is 1 story. There are 4 steps with handrails up to the entrance the patient uses.       Patient's method of transportation is via family member (baby's mama).      The highest level of education was high school diploma.      The patient currently retired.      Identified important Relationships are "My Baby's [name], [name]."       Pets : None       Interests / Fun: Sitting outdoors watching the birds and planes."       Exercise riding a bike 5 miles 3 days/week       Current Stressors: The grandkids       Religious / Personal Beliefs: "I believe in God."       [name], RN, BSN       Social Determinants of Health   Financial Resource Strain: Low Risk  (10/03/2021)   Overall Financial Resource Strain (CARDIA)    Difficulty of Paying Living Expenses: Not very hard  Food Insecurity: No Food  Insecurity (11/28/2021)   Hunger Vital Sign    Worried About Running Out of Food in the Last Year: Never true    Ran Out of Food in the Last Year: Never true  Transportation Needs: No Transportation Needs (11/28/2021)   PRAPARE - Hydrologist (Medical): No    Lack of Transportation (Non-Medical): No  Physical Activity: Sufficiently Active (10/03/2021)   Exercise Vital Sign    Days of Exercise per Week: 3 days    Minutes of Exercise per Session: 50 min  Stress: Not on file  Social Connections: Unknown (10/03/2021)   Social Connection and Isolation Panel [NHANES]    Frequency of Communication with Friends and Family: More than three times a week    Frequency of Social Gatherings with Friends and Family: More than three times a week    Attends Religious Services: Not on Advertising copywriter or Organizations: No    Attends Archivist Meetings: Never    Marital Status: Not on file   Family History  Problem Relation Age of Onset   Coronary artery disease Mother    Cancer Father        GI   Cancer Brother        Lung    ASSESSMENT Recent Results: The most recent result is correlated with 25 mg per week: Lab Results  Component Value Date   INR 3.3 (A) 01/26/2022   INR 3.4 (A) 01/05/2022   INR 3.5 (A) 12/29/2021    Anticoagulation Dosing: Description   Take only one-half (1/2) of your 5mg  peach-colored warfarin tablet on Mondays, Tuesdays, Wednesdays, Thursdays and Fridays. All other days--Sundays, and Saturdays, take one (1) of your 5mg  peach-colored warfarin tablets.      INR today:  Therapeutic  PLAN Weekly dose was decreased by 10% to 22.5 mg per week  Patient Instructions  Patient instructed to take medications as defined in the Anti-coagulation Track section of this encounter.  Patient instructed to take today's dose.  Patient instructed to take only one-half (1/2) of your 5mg  peach-colored warfarin tablet on Mondays, Tuesdays, Wednesdays, Thursdays and Fridays. All other days--Sundays, and Saturdays, take one (1) of your 5mg  peach-colored warfarin tablets.  Patient verbalized understanding of these instructions.   Patient advised to contact clinic or seek medical attention if signs/symptoms of bleeding or thromboembolism occur.  Patient verbalized understanding by repeating back information and was advised to contact me if further medication-related questions arise. Patient was also provided an information handout.  Follow-up Return in 4 weeks (on 02/23/2022) for Follow up INR.  Pennie Banter, PharmD, CPP  15 minutes spent face-to-face with the patient during the encounter. 50% of time spent on education, including signs/sx bleeding and clotting, as well as food and drug interactions with warfarin. 50% of time was spent on fingerprick POC INR sample collection,processing, results determination, and documentation in [email or web address]

## 2022-01-29 ENCOUNTER — Encounter: Payer: Self-pay | Admitting: Physical Therapy

## 2022-01-29 ENCOUNTER — Ambulatory Visit: Payer: 59 | Admitting: Physical Therapy

## 2022-01-29 DIAGNOSIS — R2681 Unsteadiness on feet: Secondary | ICD-10-CM

## 2022-01-29 DIAGNOSIS — R600 Localized edema: Secondary | ICD-10-CM

## 2022-01-29 DIAGNOSIS — M25562 Pain in left knee: Secondary | ICD-10-CM

## 2022-01-29 NOTE — Therapy (Signed)
OUTPATIENT PHYSICAL THERAPY TREATMENT NOTE   Patient Name: [name] MRN: [id number] DOB:[birth year], 76 y.o., male Today's Date: 01/29/2022  PCP: Lucious Groves, DO REFERRING PROVIDER: Lottie Mussel, MD   PT End of Session - 01/29/22 1050     Visit Number 2    Number of Visits --   1-2x/week   Date for PT Re-Evaluation 03/13/22    Authorization Type [employer] MCR - FOTO    PT Start Time [id number]    PT Stop Time 1130    PT Time Calculation (min) 39 min             Past Medical History:  Diagnosis Date   Acute GI bleeding 06/27/2021   Anemia 01/29/2016   Atrial fibrillation ([place])    post op.  s/p dc-cv 04/2008. 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  a- Echo 07/2008 Ef 55% mild peroprosthetic MVR with High transmitral gradient (mean 14).  b- normal coronaries by cath 12/2007   BPH (benign prostatic hyperplasia)    Cardiac resynchronization therapy pacemaker (CRT-P) in place 06/22/2011   CHF (congestive heart failure) (HCC)    EF 35-40 % 2011 due to valvular disease and diastolic dysfunction   Chronic back pain    CKD (chronic kidney disease), stage III ([place])    COVID-19 virus infection 07/24/2020   Tested positive on July 16, 2020   Dysphagia    with normal barium swallow 09/2008   Endocarditis    GERD (gastroesophageal reflux disease)    Heavy alcohol use    history   History of atrial flutter 03/13/2008   History of atrial flutter - resolved.    History of GI bleed    Hypertension    IV drug abuse ([place])    history of   Lower GI bleed 01/2016   Memory impairment 12/24/2015   Pacemaker 2010   Past Surgical History:  Procedure Laterality Date   BIOPSY  06/29/2021   Procedure: BIOPSY;  Surgeon: Rush Landmark Telford Nab., MD;  Location: [facility] ENDOSCOPY;  Service: Gastroenterology;;   [place] N/A  12/13/2017   Procedure: BIV PACEMAKER GENERATOR CHANGEOUT;  Surgeon: [name], MD;  Location: [place] [facility];  Service: Cardiovascular;  Laterality: N/A;   CARDIAC VALVE REPLACEMENT     mitral valve, st jude mechanical    COLONOSCOPY N/A 03/26/2014   Procedure: COLONOSCOPY;  Surgeon: Milus Banister, MD;  Location: [place];  Service: Endoscopy;  Laterality: N/A;   ENTEROSCOPY N/A 03/28/2014   Procedure: ENTEROSCOPY;  Surgeon: Milus Banister, MD;  Location: [place];  Service: Endoscopy;  Laterality: N/A;   ENTEROSCOPY N/A 06/29/2021   Procedure: ENTEROSCOPY;  Surgeon: Rush Landmark Telford Nab., MD;  Location: [place];  Service: Gastroenterology;  Laterality: N/A;   ESOPHAGOGASTRODUODENOSCOPY N/A 03/25/2014   Procedure: ESOPHAGOGASTRODUODENOSCOPY (EGD);  Surgeon: Gatha Mayer, MD;  Location: [facility] ENDOSCOPY;  Service: Endoscopy;  Laterality: N/A;   GIVENS CAPSULE STUDY N/A 03/26/2014   Procedure: GIVENS CAPSULE STUDY;  Surgeon: Milus Banister, MD;  Location: [place];  Service: Endoscopy;  Laterality: N/A;   HOT HEMOSTASIS N/A 06/29/2021   Procedure: HOT HEMOSTASIS (ARGON PLASMA COAGULATION/BICAP);  Surgeon: Irving Copas., MD;  Location: [place];  Service: Gastroenterology;  Laterality: N/A;   PACEMAKER INSERTION  inserted about 4-5 years ago   SUBMUCOSAL TATTOO INJECTION  06/29/2021   Procedure: SUBMUCOSAL TATTOO INJECTION;  Surgeon: Lemar Lofty., MD;  Location: [facility] ENDOSCOPY;  Service: Gastroenterology;;   Patient Active Problem List   Diagnosis Date Noted   Sundowning    Accident due to mechanical fall without injury    Hypokalemia 12/01/2021   Right knee pain 11/10/2021   Sleep disturbances 11/05/2021   Atrial fibrillation (HCC) 10/24/2021   Acute exacerbation of CHF (congestive heart failure) (HCC) 10/24/2021   Pre-diabetes 09/23/2021   TIA (transient ischemic attack) 09/15/2021   GI bleed 06/27/2021   Right inguinal hernia  04/21/2021   Chronic cough 02/25/2021   Lower leg pain 02/12/2021   Stage 3b chronic kidney disease (CKD) (HCC) 03/26/2016   Dementia (HCC) 12/24/2015   H/O mitral valve replacement with mechanical valve 11/13/2015   Benign prostatic hyperplasia with nocturia 11/13/2015   Healthcare maintenance 11/13/2015   History of arteriovenous malformation (AVM) 03/28/2014   Chronic biventricular, systolic heart failure (HCC) 06/02/2011   Essential hypertension 09/09/2008   History of atrial flutter 03/13/2008    THERAPY DIAG:  Unsteadiness on feet  Left knee pain, unspecified chronicity  Localized edema  REFERRING DIAG: unsteadiness on feet  PERTINENT HISTORY: Inguinal hernia, CHF, a fib, mechanical mitral valve replacement, dementia, anti-coagulant therapy  PRECAUTIONS/RESTRICTIONS:   defibrillator (implanted)  SUBJECTIVE:  Pt reports that his L knee pain is improved  Pain:  Are you having pain? Yes Pain location: diffuse L knee pain, L foot pain NPRS scale:  current 0/10 knee, 3/10 L knee pain Aggravating factors: walking, activity Relieving factors: rest Pain description: intermittent and sharp Stage: Subacute Stability: staying the same 24 hour pattern: no clear pattern   OBJECTIVE:  DIAGNOSTIC FINDINGS:  NA             GENERAL OBSERVATION/GAIT:                     Slow unstable and antalgic gait with forward trunk flexion.  Reduced time in stance on L LE   SENSATION:          Light touch: Appears intact   PALPATION: Exquisite tenderness L MCL with mild pitting edema bil LE   LE MMT:   MMT Right 01/16/2022 Left 01/16/2022  Hip flexion (L2, L3)      Knee extension (L3)      Knee flexion      Hip abduction      Hip extension      Hip external rotation      Hip internal rotation      Hip adduction      Ankle dorsiflexion (L4)      Ankle plantarflexion (S1)      Ankle inversion      Ankle eversion      Great Toe ext (L5)      Grossly 3+ 3+    (Blank  rows = not tested, score listed is out of 5 possible points.  N = WNL, D = diminished, C = clear for gross weakness with myotome testing, * = concordant pain with testing)   L knee lacking ~10 degrees ext   Functional Tests   Eval (01/16/2022)      BERG BALANCE   Sitting to Standing: Numbers; 0-4: 3           4. Stands without using hands and stabilize independently           3. Stands  independently using hands           2. Stands using hands after multiple trials           1. Min A to stand           0. Mod-Max A to stand Standing unsupported: Numbers; 0-4: 3           4. Stands safely for 2 minutes           3. Stands 2 minutes with supervision           2. Stands 30 seconds unsupported           1. Needs several tries to stand unsupported for 30 seconds           0. Unable to stand unsupported for 30 seconds Sitting unsupported: Numbers; 0-4: 4           4. Sits for 2 minutes independently           3. Sits for 2 minutes with supervision           2. Able to sit 30 seconds           1. Able to sit 10 seconds           0. Unable to sit for 10 seconds Standing to Sitting: Numbers; 0-4: 3 4. Sits safely with minimal use of hands 3. Controls descent with hands 2. Uses back of legs against chair to control descent 1. Sits independently, but uncontrolled descent 0. Needs assistance Transfers: Numbers; 0-4: 3           4. Transfers safely with minor use of hands           3. Transfers safely definite use of hands           2. Transfers with verbal cueing/supervision           1. Needs 1 person assist           0. Needs 2 person assist  Standing with eyes closed: Numbers; 0-4: 4           4. Stands safely for 10 seconds           3. Stands 10 seconds with supervision            2. Able to stand for 3 seconds           1. Unable to keep eyes closed for 3 seconds, but is safe           0. Needs assist to keep from falling Standing with feet together: Numbers; 0-4: 4 4. Stands  for 1 minute safely 3. Stands for 1 minute with supervision 2. Unable to hold for 30 seconds           1. Needs help to attain position but can hold for 15 seconds           0. Needs help to attain position and unable to hold for 15 seconds Reaching forward with outstretched arm: Numbers; 0-4: 3           4. Reaches forward 10 inches           3. Reaches forward 5 inches           2. Reaches forward 2 inches           1. Reaches forward with supervision           0. Loses balance/requires  assistace Retrieving object from the floor: Numbers; 0-4: 4 4. Able to pick up easily and safely 3. Able to pick up with supervision 2. Unable to pick up, but reaches within 1-2 inches independently 1. Unable to pick up and needs supervision 0. Unable/needs assistance to keep from falling  Turning to look behind: Numbers; 0-4: 2           4. Looks behind from both sides and weight shifts well           3. Looks behind one side only, other side less weight shift           2. Turns sideways only, maintains balance           1. Needs supervision when turning           0. Needs assistance  Turning 360 degrees: Numbers; 0-4: 1           4. Able to turn in </=4 seconds           3. Able to turn on one side in </= 4 seconds            2. Able to turn slowly, but safely           1. Needs supervision or verbal cueing           0. Needs assistance Place alternate foot on stool: Numbers; 0-4: 0 4. Completes 8 steps in 20 seconds 3. Completes 8 steps in >20 seconds 2. 4 steps without assistance/supervision 1. Completes >2 steps with minimal assist 0. Unable, needs assist to keep from falling Standing with one foot in front: Numbers; 0-4: 1           4. Independent tandem for 30 seconds           3. Independent foot ahead for 30 seconds           2. Independent small step for 30 seconds           1. Needs help to step, but can hold for 15 seconds           0. Loses balance while  standing/stepping Standing on one foot: Numbers; 0-4: 0 4. Holds >10 seconds 3. Holds 5-10 seconds 2. Holds >/=3 seconds  1. Holds <3 seconds 0. Unable    Total Score: 35/56      TUG w/ cane: 20''                                                                                            PATIENT SURVEYS:  FOTO 46 -> 58   SPECIAL TESTS:  MCL structure testing painful but stable   TODAY'S TREATMENT: Creating, reviewing, and completing below HEP   PATIENT EDUCATION:  POC, diagnosis, prognosis, HEP, and outcome measures.  Pt educated via explanation, demonstration, and handout (HEP).  Pt confirms understanding verbally.    HOME EXERCISE PROGRAM: Access Code: [email or web address] URL: [email or web address] Date: 01/16/2022 Prepared by: Shearon Balo   Exercises - Seated March  - 1 x daily - 7 x weekly - 3 sets - 10 reps - Seated Long  Arc Quad  - 1 x daily - 7 x weekly - 3 sets - 10 reps - Seated Heel Raise  - 1 x daily - 7 x weekly - 3 sets - 10 reps   ASTERISK SIGNS     Asterisk Signs Eval (01/16/2022)            Tandem and semi tandem unable            Tug w/ cane 20''            LE MMT grossly 3+                                                TREATMENT 7/27:  Therapeutic Exercise: - nu-step L6 34m while taking subjective and planning session with patient - LAQ - 3x10 ea - lat walking in //  In // 20x ea unless otherwise noted:  - march - alternating abd - alt hip ext - heel/toe raises   Neuromuscular re-ed: - FT on foam - semi tandem (50%) on foam - rocker board DF/PF   ASSESSMENT:   CLINICAL IMPRESSION: Nycere tolerated session well with no significant adverse reaction.  His main pain complaint initially was his ankle which resolved completely after therapy.  His L knee was a bit sore following therapy.  His balance is slightly improved from eval.  He shows gross LE weakness and instability with narrow base of support.   OBJECTIVE  IMPAIRMENTS: Pain, knee ROM, balance, gait, LE strength   ACTIVITY LIMITATIONS: ambulation in community, bending, lifting, squatting, transfers   PERSONAL FACTORS: See medical history and pertinent history     REHAB POTENTIAL: Good   CLINICAL DECISION MAKING: Stable/uncomplicated   EVALUATION COMPLEXITY: Low     GOALS:     SHORT TERM GOALS: Target date: 02/06/2022   Traveon will be >75% HEP compliant to improve carryover between sessions and facilitate independent management of condition   Evaluation (01/16/2022): ongoing Goal status: INITIAL     LONG TERM GOALS: Target date: 03/13/2022   Martie will imporve BERG balance score to 42 pts, to show a significant improvement in balance in order to reduce fall risk   Evaluation/Baseline (01/16/2022): 35 pts Goal status: INITIAL   See interpretation below:         2.  Ren will lower TUG score (MCID 3.4'') to <=15'' to show reduced risk of falls    Evaluation/Baseline (01/16/2022): 20'' Goal status: INITIAL     3.  Tung will improve FOTO score to 58 as a proxy for functional improvement   Evaluation/Baseline (01/16/2022): 46 Goal status: INITIAL     4.  Mathius will self report >/= 50% decrease in pain from evaluation    Evaluation/Baseline (01/16/2022): 9/10 max pain Goal status: INITIAL       PLAN: PT FREQUENCY: 1-2x/week   PT DURATION: 8 weeks (Ending 03/13/2022)   PLANNED INTERVENTIONS: Therapeutic exercises, Aquatic therapy, Therapeutic activity, Neuro Muscular re-education, Gait training, Patient/Family education, Joint mobilization, Dry Needling, Spinal mobilization and/or manipulation, Moist heat, Taping, Vasopneumatic device, Ionotophoresis 4mg /ml Dexamethasone, and Manual therapy   PLAN FOR NEXT SESSION: progressive knee strengthening, balance, gait, general endurance    Levi Klaiber PT 01/29/2022, 11:34 AM

## 2022-02-04 NOTE — Therapy (Signed)
OUTPATIENT PHYSICAL THERAPY TREATMENT NOTE   Patient Name: [name] MRN: [id number] DOB:01-05-1946, 76 y.o., male Today's Date: 02/05/2022  PCP: Gust Rung, DO REFERRING PROVIDER: Mercie Eon, MD   PT End of Session - 02/05/22 1053     Visit Number 3    Date for PT Re-Evaluation 03/13/22    Authorization Type [employer] MCR - FOTO    PT Start Time 1052    PT Stop Time 1130    PT Time Calculation (min) 38 min    Activity Tolerance Patient tolerated treatment well    Behavior During Therapy [facility] for tasks assessed/performed              Past Medical History:  Diagnosis Date   Acute GI bleeding 06/27/2021   Anemia 01/29/2016   Atrial fibrillation (HCC)    post op.  s/p dc-cv 04/2008. 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  a- Echo 07/2008 Ef 55% mild peroprosthetic MVR with High transmitral gradient (mean 14).  b- normal coronaries by cath 12/2007   BPH (benign prostatic hyperplasia)    Cardiac resynchronization therapy pacemaker (CRT-P) in place 06/22/2011   CHF (congestive heart failure) (HCC)    EF 35-40 % 2011 due to valvular disease and diastolic dysfunction   Chronic back pain    CKD (chronic kidney disease), stage III (HCC)    COVID-19 virus infection 07/24/2020   Tested positive on July 16, 2020   Dysphagia    with normal barium swallow 09/2008   Endocarditis    GERD (gastroesophageal reflux disease)    Heavy alcohol use    history   History of atrial flutter 03/13/2008   History of atrial flutter - resolved.    History of GI bleed    Hypertension    IV drug abuse (HCC)    history of   Lower GI bleed 01/2016   Memory impairment 12/24/2015   Pacemaker 2010   Past Surgical History:  Procedure Laterality Date   BIOPSY  06/29/2021   Procedure: BIOPSY;  Surgeon: Meridee Score Netty Starring., MD;  Location: [facility]  ENDOSCOPY;  Service: Gastroenterology;;   BIV PACEMAKER GENERATOR CHANGEOUT N/A 12/13/2017   Procedure: BIV PACEMAKER GENERATOR CHANGEOUT;  Surgeon: Duke Salvia, MD;  Location: [facility] INVASIVE CV LAB;  Service: Cardiovascular;  Laterality: N/A;   CARDIAC VALVE REPLACEMENT     mitral valve, st jude mechanical    COLONOSCOPY N/A 03/26/2014   Procedure: COLONOSCOPY;  Surgeon: Rachael Fee, MD;  Location: [facility] ENDOSCOPY;  Service: Endoscopy;  Laterality: N/A;   ENTEROSCOPY N/A 03/28/2014   Procedure: ENTEROSCOPY;  Surgeon: Rachael Fee, MD;  Location: [facility] ENDOSCOPY;  Service: Endoscopy;  Laterality: N/A;   ENTEROSCOPY N/A 06/29/2021   Procedure: ENTEROSCOPY;  Surgeon: Meridee Score Netty Starring., MD;  Location: [facility] ENDOSCOPY;  Service: Gastroenterology;  Laterality: N/A;   ESOPHAGOGASTRODUODENOSCOPY N/A 03/25/2014   Procedure: ESOPHAGOGASTRODUODENOSCOPY (EGD);  Surgeon: Iva Boop, MD;  Location: [facility] ENDOSCOPY;  Service: Endoscopy;  Laterality: N/A;   GIVENS CAPSULE STUDY N/A 03/26/2014   Procedure: GIVENS CAPSULE STUDY;  Surgeon: Rachael Fee, MD;  Location: [facility] ENDOSCOPY;  Service: Endoscopy;  Laterality: N/A;   HOT HEMOSTASIS N/A 06/29/2021   Procedure: HOT HEMOSTASIS (ARGON PLASMA COAGULATION/BICAP);  Surgeon: Lemar Lofty., MD;  Location: [facility] ENDOSCOPY;  Service: Gastroenterology;  Laterality: N/A;   PACEMAKER INSERTION     inserted about 4-5 years ago   SUBMUCOSAL TATTOO INJECTION  06/29/2021   Procedure: SUBMUCOSAL TATTOO INJECTION;  Surgeon: Lemar Lofty., MD;  Location: [facility] ENDOSCOPY;  Service: Gastroenterology;;   Patient Active Problem List   Diagnosis Date Noted   Sundowning    Accident due to mechanical fall without injury    Hypokalemia 12/01/2021   Right knee pain 11/10/2021   Sleep disturbances 11/05/2021   Atrial fibrillation (HCC) 10/24/2021   Acute exacerbation of CHF (congestive heart failure) (HCC) 10/24/2021   Pre-diabetes 09/23/2021   TIA (transient  ischemic attack) 09/15/2021   GI bleed 06/27/2021   Right inguinal hernia 04/21/2021   Chronic cough 02/25/2021   Lower leg pain 02/12/2021   Stage 3b chronic kidney disease (CKD) (HCC) 03/26/2016   Dementia (HCC) 12/24/2015   H/O mitral valve replacement with mechanical valve 11/13/2015   Benign prostatic hyperplasia with nocturia 11/13/2015   Healthcare maintenance 11/13/2015   History of arteriovenous malformation (AVM) 03/28/2014   Chronic biventricular, systolic heart failure (HCC) 06/02/2011   Essential hypertension 09/09/2008   History of atrial flutter 03/13/2008    THERAPY DIAG:  Unsteadiness on feet  Left knee pain, unspecified chronicity  Localized edema  REFERRING DIAG: unsteadiness on feet  PERTINENT HISTORY: Inguinal hernia, CHF, a fib, mechanical mitral valve replacement, dementia, anti-coagulant therapy  PRECAUTIONS/RESTRICTIONS:   defibrillator (implanted)  SUBJECTIVE: Pt reports that he doesn't have any knee pain today. He states he hasn't been doing his HEP, but has been walking up and down his street.  Pain:  Are you having pain? Yes Pain location: diffuse L knee pain, L foot pain NPRS scale:  current 0/10 knee Aggravating factors: walking, activity Relieving factors: rest Pain description: intermittent and sharp Stage: Subacute Stability: staying the same 24 hour pattern: no clear pattern   OBJECTIVE:  DIAGNOSTIC FINDINGS:  NA             GENERAL OBSERVATION/GAIT:                     Slow unstable and antalgic gait with forward trunk flexion.  Reduced time in stance on L LE   SENSATION:          Light touch: Appears intact   PALPATION: Exquisite tenderness L MCL with mild pitting edema bil LE   LE MMT:   MMT Right 01/16/2022 Left 01/16/2022  Hip flexion (L2, L3)      Knee extension (L3)      Knee flexion      Hip abduction      Hip extension      Hip external rotation      Hip internal rotation      Hip adduction      Ankle  dorsiflexion (L4)      Ankle plantarflexion (S1)      Ankle inversion      Ankle eversion      Great Toe ext (L5)      Grossly 3+ 3+    (Blank rows = not tested, score listed is out of 5 possible points.  N = WNL, D = diminished, C = clear for gross weakness with myotome testing, * = concordant pain with testing)   L knee lacking ~10 degrees ext   Functional Tests   Eval (01/16/2022)      BERG BALANCE   Sitting to Standing: Numbers; 0-4: 3  4. Stands without using hands and stabilize independently           3. Stands independently using hands           2. Stands using hands after multiple trials           1. Min A to stand           0. Mod-Max A to stand Standing unsupported: Numbers; 0-4: 3           4. Stands safely for 2 minutes           3. Stands 2 minutes with supervision           2. Stands 30 seconds unsupported           1. Needs several tries to stand unsupported for 30 seconds           0. Unable to stand unsupported for 30 seconds Sitting unsupported: Numbers; 0-4: 4           4. Sits for 2 minutes independently           3. Sits for 2 minutes with supervision           2. Able to sit 30 seconds           1. Able to sit 10 seconds           0. Unable to sit for 10 seconds Standing to Sitting: Numbers; 0-4: 3 4. Sits safely with minimal use of hands 3. Controls descent with hands 2. Uses back of legs against chair to control descent 1. Sits independently, but uncontrolled descent 0. Needs assistance Transfers: Numbers; 0-4: 3           4. Transfers safely with minor use of hands           3. Transfers safely definite use of hands           2. Transfers with verbal cueing/supervision           1. Needs 1 person assist           0. Needs 2 person assist  Standing with eyes closed: Numbers; 0-4: 4           4. Stands safely for 10 seconds           3. Stands 10 seconds with supervision            2. Able to stand for 3 seconds           1. Unable to  keep eyes closed for 3 seconds, but is safe           0. Needs assist to keep from falling Standing with feet together: Numbers; 0-4: 4 4. Stands for 1 minute safely 3. Stands for 1 minute with supervision 2. Unable to hold for 30 seconds           1. Needs help to attain position but can hold for 15 seconds           0. Needs help to attain position and unable to hold for 15 seconds Reaching forward with outstretched arm: Numbers; 0-4: 3           4. Reaches forward 10 inches           3. Reaches forward 5 inches           2. Reaches forward 2 inches  1. Reaches forward with supervision           0. Loses balance/requires assistace Retrieving object from the floor: Numbers; 0-4: 4 4. Able to pick up easily and safely 3. Able to pick up with supervision 2. Unable to pick up, but reaches within 1-2 inches independently 1. Unable to pick up and needs supervision 0. Unable/needs assistance to keep from falling  Turning to look behind: Numbers; 0-4: 2           4. Looks behind from both sides and weight shifts well           3. Looks behind one side only, other side less weight shift           2. Turns sideways only, maintains balance           1. Needs supervision when turning           0. Needs assistance  Turning 360 degrees: Numbers; 0-4: 1           4. Able to turn in </=4 seconds           3. Able to turn on one side in </= 4 seconds            2. Able to turn slowly, but safely           1. Needs supervision or verbal cueing           0. Needs assistance Place alternate foot on stool: Numbers; 0-4: 0 4. Completes 8 steps in 20 seconds 3. Completes 8 steps in >20 seconds 2. 4 steps without assistance/supervision 1. Completes >2 steps with minimal assist 0. Unable, needs assist to keep from falling Standing with one foot in front: Numbers; 0-4: 1           4. Independent tandem for 30 seconds           3. Independent foot ahead for 30 seconds           2.  Independent small step for 30 seconds           1. Needs help to step, but can hold for 15 seconds           0. Loses balance while standing/stepping Standing on one foot: Numbers; 0-4: 0 4. Holds >10 seconds 3. Holds 5-10 seconds 2. Holds >/=3 seconds  1. Holds <3 seconds 0. Unable    Total Score: 35/56      TUG w/ cane: 20''                                                                                            PATIENT SURVEYS:  FOTO 46 -> 58   SPECIAL TESTS:  MCL structure testing painful but stable   TODAY'S TREATMENT: Creating, reviewing, and completing below HEP   PATIENT EDUCATION:  POC, diagnosis, prognosis, HEP, and outcome measures.  Pt educated via explanation, demonstration, and handout (HEP).  Pt confirms understanding verbally.    HOME EXERCISE PROGRAM: Access Code: [email or web address] URL: [email or web address] Date: 01/16/2022 Prepared by: Shearon Balo   Exercises - Seated March  -  1 x daily - 7 x weekly - 3 sets - 10 reps - Seated Long Arc Quad  - 1 x daily - 7 x weekly - 3 sets - 10 reps - Seated Heel Raise  - 1 x daily - 7 x weekly - 3 sets - 10 reps   ASTERISK SIGNS     Asterisk Signs Eval (01/16/2022) 02/05/22           Tandem and semi tandem unable Semi tandem on foam x30"           Tug w/ cane 20''            LE MMT grossly 3+                                              TREATMENT 02/05/22: Therapeutic Exercise: - nu-step L6 12m while taking subjective and planning session with patient - LAQ - 2x10 ea - lat walking in // In // 20x ea unless otherwise noted: - march - alternating abd - alt hip ext - heel/toe raises Neuromuscular re-ed: - FT on foam EO/EC/head turns x30" each - semi tandem (50%) on foam x30" BIL - rocker board DF/PF   TREATMENT 7/27:  Therapeutic Exercise: - nu-step L6 28m while taking subjective and planning session with patient - LAQ - 3x10 ea - lat walking in //  In // 20x ea unless otherwise  noted:  - march - alternating abd - alt hip ext - heel/toe raises   Neuromuscular re-ed: - FT on foam - semi tandem (50%) on foam - rocker board DF/PF   ASSESSMENT:   CLINICAL IMPRESSION: Patient presents to PT with no current pain in his knee and reports HEP noncompliance but that he is walking at home with no pain in his L knee. Session today continued to focus on LE strengthening and balance training to reduce risk of falls. Patient was able to tolerate all prescribed exercises with no adverse effects. Patient continues to benefit from skilled PT services and should be progressed as able to improve functional independence.   OBJECTIVE IMPAIRMENTS: Pain, knee ROM, balance, gait, LE strength   ACTIVITY LIMITATIONS: ambulation in community, bending, lifting, squatting, transfers   PERSONAL FACTORS: See medical history and pertinent history     REHAB POTENTIAL: Good   CLINICAL DECISION MAKING: Stable/uncomplicated   EVALUATION COMPLEXITY: Low     GOALS:     SHORT TERM GOALS: Target date: 02/06/2022   Ammon will be >75% HEP compliant to improve carryover between sessions and facilitate independent management of condition   Evaluation (01/16/2022): ongoing Goal status: Ongoing Pt reports noncompliance 02/05/22     LONG TERM GOALS: Target date: 03/13/2022   Jeyren will imporve BERG balance score to 42 pts, to show a significant improvement in balance in order to reduce fall risk   Evaluation/Baseline (01/16/2022): 35 pts Goal status: INITIAL   See interpretation below:         2.  Keane will lower TUG score (MCID 3.4'') to <=15'' to show reduced risk of falls    Evaluation/Baseline (01/16/2022): 20'' Goal status: INITIAL     3.  Juaquin will improve FOTO score to 58 as a proxy for functional improvement   Evaluation/Baseline (01/16/2022): 46 Goal status: INITIAL     4.  Roxanne will self report >/= 50% decrease in pain from evaluation  Evaluation/Baseline  (01/16/2022): 9/10 max pain Goal status: INITIAL       PLAN: PT FREQUENCY: 1-2x/week   PT DURATION: 8 weeks (Ending 03/13/2022)   PLANNED INTERVENTIONS: Therapeutic exercises, Aquatic therapy, Therapeutic activity, Neuro Muscular re-education, Gait training, Patient/Family education, Joint mobilization, Dry Needling, Spinal mobilization and/or manipulation, Moist heat, Taping, Vasopneumatic device, Ionotophoresis 4mg /ml Dexamethasone, and Manual therapy   PLAN FOR NEXT SESSION: progressive knee strengthening, balance, gait, general endurance    [employer] PTA 02/05/2022, 11:28 AM

## 2022-02-05 ENCOUNTER — Ambulatory Visit: Payer: 59 | Attending: Internal Medicine

## 2022-02-05 DIAGNOSIS — R2681 Unsteadiness on feet: Secondary | ICD-10-CM | POA: Diagnosis present

## 2022-02-05 DIAGNOSIS — R600 Localized edema: Secondary | ICD-10-CM | POA: Diagnosis present

## 2022-02-05 DIAGNOSIS — M25562 Pain in left knee: Secondary | ICD-10-CM | POA: Diagnosis present

## 2022-02-12 ENCOUNTER — Encounter: Payer: Self-pay | Admitting: Physical Therapy

## 2022-02-12 ENCOUNTER — Ambulatory Visit: Payer: 59 | Admitting: Physical Therapy

## 2022-02-12 DIAGNOSIS — M25562 Pain in left knee: Secondary | ICD-10-CM

## 2022-02-12 DIAGNOSIS — R2681 Unsteadiness on feet: Secondary | ICD-10-CM | POA: Diagnosis not present

## 2022-02-12 DIAGNOSIS — R600 Localized edema: Secondary | ICD-10-CM

## 2022-02-12 NOTE — Therapy (Signed)
OUTPATIENT PHYSICAL THERAPY TREATMENT NOTE   Patient Name: [name] MRN: [id number] DOB:Nov 23, 1945, 76 y.o., male Today's Date: 02/12/2022  PCP: Lucious Groves, DO REFERRING PROVIDER: Lottie Mussel, MD   PT End of Session - 02/12/22 0946     Visit Number 4    Number of Visits --   1-2x/week   Date for PT Re-Evaluation 03/13/22    Authorization Type UHC MCR - FOTO    PT Start Time 0947    PT Stop Time 1027    PT Time Calculation (min) 40 min    Activity Tolerance Patient tolerated treatment well    Behavior During Therapy [facility] for tasks assessed/performed              Past Medical History:  Diagnosis Date   Acute GI bleeding 06/27/2021   Anemia 01/29/2016   Atrial fibrillation ([place])    post op.  s/p dc-cv 04/2008. 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  a- Echo 07/2008 Ef 55% mild peroprosthetic MVR with High transmitral gradient (mean 14).  b- normal coronaries by cath 12/2007   BPH (benign prostatic hyperplasia)    Cardiac resynchronization therapy pacemaker (CRT-P) in place 06/22/2011   CHF (congestive heart failure) (HCC)    EF 35-40 % 2011 due to valvular disease and diastolic dysfunction   Chronic back pain    CKD (chronic kidney disease), stage III ([place])    COVID-19 virus infection 07/24/2020   Tested positive on July 16, 2020   Dysphagia    with normal barium swallow 09/2008   Endocarditis    GERD (gastroesophageal reflux disease)    Heavy alcohol use    history   History of atrial flutter 03/13/2008   History of atrial flutter - resolved.    History of GI bleed    Hypertension    IV drug abuse ([place])    history of   Lower GI bleed 01/2016   Memory impairment 12/24/2015   Pacemaker 2010   Past Surgical History:  Procedure Laterality Date   BIOPSY  06/29/2021   Procedure: BIOPSY;  Surgeon:  Rush Landmark Telford Nab., MD;  Location: [facility] ENDOSCOPY;  Service: Gastroenterology;;   [place] N/A 12/13/2017   Procedure: BIV PACEMAKER GENERATOR CHANGEOUT;  Surgeon: [name], MD;  Location: [place] [facility];  Service: Cardiovascular;  Laterality: N/A;   CARDIAC VALVE REPLACEMENT     mitral valve, st jude mechanical    COLONOSCOPY N/A 03/26/2014   Procedure: COLONOSCOPY;  Surgeon: Milus Banister, MD;  Location: [place];  Service: Endoscopy;  Laterality: N/A;   ENTEROSCOPY N/A 03/28/2014   Procedure: ENTEROSCOPY;  Surgeon: Milus Banister, MD;  Location: [place];  Service: Endoscopy;  Laterality: N/A;   ENTEROSCOPY N/A 06/29/2021   Procedure: ENTEROSCOPY;  Surgeon: Rush Landmark Telford Nab., MD;  Location: [place];  Service: Gastroenterology;  Laterality: N/A;   ESOPHAGOGASTRODUODENOSCOPY N/A 03/25/2014   Procedure: ESOPHAGOGASTRODUODENOSCOPY (EGD);  Surgeon: Gatha Mayer, MD;  Location: [facility] ENDOSCOPY;  Service: Endoscopy;  Laterality: N/A;   GIVENS CAPSULE STUDY N/A 03/26/2014   Procedure: GIVENS CAPSULE STUDY;  Surgeon: Milus Banister, MD;  Location: [place];  Service: Endoscopy;  Laterality: N/A;   HOT HEMOSTASIS N/A 06/29/2021   Procedure: HOT HEMOSTASIS (ARGON PLASMA COAGULATION/BICAP);  Surgeon: Justice Britain  Brooke Bonito., MD;  Location: [place];  Service: Gastroenterology;  Laterality: N/A;   PACEMAKER INSERTION     inserted about 4-5 years ago   [place]  06/29/2021   Procedure: SUBMUCOSAL TATTOO INJECTION;  Surgeon: Irving Copas., MD;  Location: [facility] ENDOSCOPY;  Service: Gastroenterology;;   Patient Active Problem List   Diagnosis Date Noted   [place]    Accident due to mechanical fall without injury    Hypokalemia 12/01/2021   Right knee pain 11/10/2021   Sleep disturbances 11/05/2021   Atrial fibrillation ([place]) 10/24/2021   Acute exacerbation of CHF (congestive heart failure) ([place]) 10/24/2021    Pre-diabetes 09/23/2021   TIA (transient ischemic attack) 09/15/2021   GI bleed 06/27/2021   Right inguinal hernia 04/21/2021   Chronic cough 02/25/2021   Lower leg pain 02/12/2021   Stage 3b chronic kidney disease (CKD) ([place]) 03/26/2016   Dementia ([place]) 12/24/2015   H/O mitral valve replacement with mechanical valve 11/13/2015   Benign prostatic hyperplasia with nocturia 11/13/2015   Healthcare maintenance 11/13/2015   History of arteriovenous malformation (AVM) 03/28/2014   Chronic biventricular, systolic heart failure ([place]) 06/02/2011   Essential hypertension 09/09/2008   History of atrial flutter 03/13/2008    THERAPY DIAG:  Unsteadiness on feet  Left knee pain, unspecified chronicity  Localized edema  REFERRING DIAG: unsteadiness on feet  PERTINENT HISTORY: Inguinal hernia, CHF, a fib, mechanical mitral valve replacement, dementia, anti-coagulant therapy  PRECAUTIONS/RESTRICTIONS:   defibrillator (implanted)  SUBJECTIVE:  Pt reports he is feeling improvement from PT.  He would like to continue, especially to work on his L knee pain  Pain:  Are you having pain? Yes Pain location: diffuse L knee pain, L foot pain NPRS scale:  current 2/10 knee Aggravating factors: walking, activity Relieving factors: rest Pain description: intermittent and sharp Stage: Subacute Stability: staying the same 24 hour pattern: no clear pattern   OBJECTIVE:  DIAGNOSTIC FINDINGS:  NA             GENERAL OBSERVATION/GAIT:                     Slow unstable and antalgic gait with forward trunk flexion.  Reduced time in stance on L LE   SENSATION:          Light touch: Appears intact   PALPATION: Exquisite tenderness L MCL with mild pitting edema bil LE   LE MMT:   MMT Right 01/16/2022 Left 01/16/2022  Hip flexion (L2, L3)      Knee extension (L3)      Knee flexion      Hip abduction      Hip extension      Hip external rotation      Hip internal rotation      Hip  adduction      Ankle dorsiflexion (L4)      Ankle plantarflexion (S1)      Ankle inversion      Ankle eversion      Great Toe ext (L5)      Grossly 3+ 3+    (Blank rows = not tested, score listed is out of 5 possible points.  N = WNL, D = diminished, C = clear for gross weakness with myotome testing, * = concordant pain with testing)   L knee lacking ~10 degrees ext   Functional Tests   Eval (01/16/2022)      BERG BALANCE   Sitting to Standing: Numbers; 0-4: 3  4. Stands without using hands and stabilize independently           3. Stands independently using hands           2. Stands using hands after multiple trials           1. Min A to stand           0. Mod-Max A to stand Standing unsupported: Numbers; 0-4: 3           4. Stands safely for 2 minutes           3. Stands 2 minutes with supervision           2. Stands 30 seconds unsupported           1. Needs several tries to stand unsupported for 30 seconds           0. Unable to stand unsupported for 30 seconds Sitting unsupported: Numbers; 0-4: 4           4. Sits for 2 minutes independently           3. Sits for 2 minutes with supervision           2. Able to sit 30 seconds           1. Able to sit 10 seconds           0. Unable to sit for 10 seconds Standing to Sitting: Numbers; 0-4: 3 4. Sits safely with minimal use of hands 3. Controls descent with hands 2. Uses back of legs against chair to control descent 1. Sits independently, but uncontrolled descent 0. Needs assistance Transfers: Numbers; 0-4: 3           4. Transfers safely with minor use of hands           3. Transfers safely definite use of hands           2. Transfers with verbal cueing/supervision           1. Needs 1 person assist           0. Needs 2 person assist  Standing with eyes closed: Numbers; 0-4: 4           4. Stands safely for 10 seconds           3. Stands 10 seconds with supervision            2. Able to stand for 3 seconds            1. Unable to keep eyes closed for 3 seconds, but is safe           0. Needs assist to keep from falling Standing with feet together: Numbers; 0-4: 4 4. Stands for 1 minute safely 3. Stands for 1 minute with supervision 2. Unable to hold for 30 seconds           1. Needs help to attain position but can hold for 15 seconds           0. Needs help to attain position and unable to hold for 15 seconds Reaching forward with outstretched arm: Numbers; 0-4: 3           4. Reaches forward 10 inches           3. Reaches forward 5 inches           2. Reaches forward 2 inches  1. Reaches forward with supervision           0. Loses balance/requires assistace Retrieving object from the floor: Numbers; 0-4: 4 4. Able to pick up easily and safely 3. Able to pick up with supervision 2. Unable to pick up, but reaches within 1-2 inches independently 1. Unable to pick up and needs supervision 0. Unable/needs assistance to keep from falling  Turning to look behind: Numbers; 0-4: 2           4. Looks behind from both sides and weight shifts well           3. Looks behind one side only, other side less weight shift           2. Turns sideways only, maintains balance           1. Needs supervision when turning           0. Needs assistance  Turning 360 degrees: Numbers; 0-4: 1           4. Able to turn in </=4 seconds           3. Able to turn on one side in </= 4 seconds            2. Able to turn slowly, but safely           1. Needs supervision or verbal cueing           0. Needs assistance Place alternate foot on stool: Numbers; 0-4: 0 4. Completes 8 steps in 20 seconds 3. Completes 8 steps in >20 seconds 2. 4 steps without assistance/supervision 1. Completes >2 steps with minimal assist 0. Unable, needs assist to keep from falling Standing with one foot in front: Numbers; 0-4: 1           4. Independent tandem for 30 seconds           3. Independent foot ahead for 30 seconds            2. Independent small step for 30 seconds           1. Needs help to step, but can hold for 15 seconds           0. Loses balance while standing/stepping Standing on one foot: Numbers; 0-4: 0 4. Holds >10 seconds 3. Holds 5-10 seconds 2. Holds >/=3 seconds  1. Holds <3 seconds 0. Unable    Total Score: 35/56      TUG w/ cane: 20''                                                                                            PATIENT SURVEYS:  FOTO 46 -> 58   SPECIAL TESTS:  MCL structure testing painful but stable   TODAY'S TREATMENT: Creating, reviewing, and completing below HEP   PATIENT EDUCATION:  POC, diagnosis, prognosis, HEP, and outcome measures.  Pt educated via explanation, demonstration, and handout (HEP).  Pt confirms understanding verbally.    HOME EXERCISE PROGRAM: Access Code: [email or web address] URL: [email or web address] Date: 01/16/2022 Prepared by: Shearon Balo   Exercises - Seated March  -  1 x daily - 7 x weekly - 3 sets - 10 reps - Seated Long Arc Quad  - 1 x daily - 7 x weekly - 3 sets - 10 reps - Seated Heel Raise  - 1 x daily - 7 x weekly - 3 sets - 10 reps   ASTERISK SIGNS     Asterisk Signs Eval (01/16/2022) 02/05/22  8/10         Tandem and semi tandem unable Semi tandem on foam x30"           Tug w/ cane 20''    13''        LE MMT grossly 3+                                              TREATMENT 8/10:  Therapeutic Exercise: - nu-step L 31mwhile taking subjective and planning session with patient - LAQ - 4x10 ea - 4#  In // 20x ea w/ 4# ankle weights unless otherwise noted:  - walking march - 4 laps - lateral hurdle walking - 4 laps - alternating abd - alt hip ext - heel/toe raises  Neuromuscular re-ed: - semi tandem (100%) on foam - rocker board DF/PF   TREATMENT 02/05/22: Therapeutic Exercise: - nu-step L6 665mhile taking subjective and planning session with patient - LAQ - 2x10 ea - lat walking in // In  // 20x ea unless otherwise noted: - march - alternating abd - alt hip ext - heel/toe raises Neuromuscular re-ed: - FT on foam EO/EC/head turns x30" each - semi tandem (50%) on foam x30" BIL - rocker board DF/PF   TREATMENT 7/27:  Therapeutic Exercise: - nu-step L6 50m35mile taking subjective and planning session with patient - LAQ - 3x10 ea - lat walking in //  In // 20x ea unless otherwise noted:  - march - alternating abd - alt hip ext - heel/toe raises   Neuromuscular re-ed: - FT on foam - semi tandem (50%) on foam - rocker board DF/PF   ASSESSMENT:   CLINICAL IMPRESSION: Everardo tolerated session well with no adverse reaction.  He has consistently been improving his balance, quad strength, and activity volume.  He feels this is improving his ability to complete ADLs safely at home as well as his knee pain.  We will continue to progress as tolerated.  OBJECTIVE IMPAIRMENTS: Pain, knee ROM, balance, gait, LE strength   ACTIVITY LIMITATIONS: ambulation in community, bending, lifting, squatting, transfers   PERSONAL FACTORS: See medical history and pertinent history     REHAB POTENTIAL: Good   CLINICAL DECISION MAKING: Stable/uncomplicated   EVALUATION COMPLEXITY: Low     GOALS:     SHORT TERM GOALS: Target date: 02/06/2022   EddJaceyonll be >75% HEP compliant to improve carryover between sessions and facilitate independent management of condition   Evaluation (01/16/2022): ongoing Goal status: Ongoing Pt reports noncompliance 02/05/22     LONG TERM GOALS: Target date: 03/13/2022   EddRancell imporve BERG balance score to 42 pts, to show a significant improvement in balance in order to reduce fall risk   Evaluation/Baseline (01/16/2022): 35 pts Goal status: INITIAL   See interpretation below:         2.  Burrell will lower TUG score (MCID 3.4'') to <=15'' to show reduced risk of falls    Evaluation/Baseline (01/16/2022): 20''  8/10: MET 13'' Goal status:  MET     3.  Kye will improve FOTO score to 58 as a proxy for functional improvement   Evaluation/Baseline (01/16/2022): 46 Goal status: INITIAL     4.  Jesusmanuel will self report >/= 50% decrease in pain from evaluation    Evaluation/Baseline (01/16/2022): 9/10 max pain Goal status: INITIAL       PLAN: PT FREQUENCY: 1-2x/week   PT DURATION: 8 weeks (Ending 03/13/2022)   PLANNED INTERVENTIONS: Therapeutic exercises, Aquatic therapy, Therapeutic activity, Neuro Muscular re-education, Gait training, Patient/Family education, Joint mobilization, Dry Needling, Spinal mobilization and/or manipulation, Moist heat, Taping, Vasopneumatic device, Ionotophoresis 32m/ml Dexamethasone, and Manual therapy   PLAN FOR NEXT SESSION: progressive knee strengthening, balance, gait, general endurance    KKevan NyReinhartsen PT 02/12/2022, 10:36 AM

## 2022-02-19 ENCOUNTER — Other Ambulatory Visit: Payer: Self-pay | Admitting: Internal Medicine

## 2022-02-19 ENCOUNTER — Ambulatory Visit: Payer: 59

## 2022-02-19 DIAGNOSIS — I4892 Unspecified atrial flutter: Secondary | ICD-10-CM

## 2022-02-19 NOTE — Telephone Encounter (Signed)
Next coumadin clinic appt is 02/23/22.

## 2022-02-22 ENCOUNTER — Other Ambulatory Visit: Payer: Self-pay

## 2022-02-22 ENCOUNTER — Emergency Department (HOSPITAL_BASED_OUTPATIENT_CLINIC_OR_DEPARTMENT_OTHER): Payer: 59

## 2022-02-22 ENCOUNTER — Inpatient Hospital Stay (HOSPITAL_BASED_OUTPATIENT_CLINIC_OR_DEPARTMENT_OTHER)
Admission: EM | Admit: 2022-02-22 | Discharge: 2022-02-24 | DRG: 554 | Disposition: A | Discharge status: Home / Self Care | Payer: 59 | Attending: Internal Medicine | Admitting: Internal Medicine

## 2022-02-22 ENCOUNTER — Encounter (HOSPITAL_BASED_OUTPATIENT_CLINIC_OR_DEPARTMENT_OTHER): Payer: Self-pay

## 2022-02-22 ENCOUNTER — Encounter (HOSPITAL_COMMUNITY): Payer: Self-pay

## 2022-02-22 DIAGNOSIS — I4892 Unspecified atrial flutter: Secondary | ICD-10-CM

## 2022-02-22 DIAGNOSIS — A419 Sepsis, unspecified organism: Secondary | ICD-10-CM

## 2022-02-22 DIAGNOSIS — M009 Pyogenic arthritis, unspecified: Secondary | ICD-10-CM | POA: Insufficient documentation

## 2022-02-22 DIAGNOSIS — K219 Gastro-esophageal reflux disease without esophagitis: Secondary | ICD-10-CM | POA: Diagnosis present

## 2022-02-22 DIAGNOSIS — I428 Other cardiomyopathies: Secondary | ICD-10-CM | POA: Diagnosis present

## 2022-02-22 DIAGNOSIS — I4891 Unspecified atrial fibrillation: Secondary | ICD-10-CM | POA: Diagnosis present

## 2022-02-22 DIAGNOSIS — N400 Enlarged prostate without lower urinary tract symptoms: Secondary | ICD-10-CM | POA: Diagnosis present

## 2022-02-22 DIAGNOSIS — R1310 Dysphagia, unspecified: Secondary | ICD-10-CM | POA: Diagnosis present

## 2022-02-22 DIAGNOSIS — I130 Hypertensive heart and chronic kidney disease with heart failure and stage 1 through stage 4 chronic kidney disease, or unspecified chronic kidney disease: Secondary | ICD-10-CM | POA: Diagnosis present

## 2022-02-22 DIAGNOSIS — E8779 Other fluid overload: Secondary | ICD-10-CM

## 2022-02-22 DIAGNOSIS — M25461 Effusion, right knee: Secondary | ICD-10-CM | POA: Diagnosis present

## 2022-02-22 DIAGNOSIS — M549 Dorsalgia, unspecified: Secondary | ICD-10-CM | POA: Diagnosis present

## 2022-02-22 DIAGNOSIS — I440 Atrioventricular block, first degree: Secondary | ICD-10-CM | POA: Diagnosis present

## 2022-02-22 DIAGNOSIS — M1990 Unspecified osteoarthritis, unspecified site: Secondary | ICD-10-CM | POA: Diagnosis present

## 2022-02-22 DIAGNOSIS — E876 Hypokalemia: Secondary | ICD-10-CM | POA: Diagnosis present

## 2022-02-22 DIAGNOSIS — Z20822 Contact with and (suspected) exposure to covid-19: Secondary | ICD-10-CM | POA: Diagnosis present

## 2022-02-22 DIAGNOSIS — Z952 Presence of prosthetic heart valve: Secondary | ICD-10-CM

## 2022-02-22 DIAGNOSIS — Z79899 Other long term (current) drug therapy: Secondary | ICD-10-CM

## 2022-02-22 DIAGNOSIS — N183 Chronic kidney disease, stage 3 unspecified: Secondary | ICD-10-CM | POA: Diagnosis present

## 2022-02-22 DIAGNOSIS — Z8249 Family history of ischemic heart disease and other diseases of the circulatory system: Secondary | ICD-10-CM

## 2022-02-22 DIAGNOSIS — I472 Ventricular tachycardia, unspecified: Secondary | ICD-10-CM | POA: Diagnosis present

## 2022-02-22 DIAGNOSIS — G8929 Other chronic pain: Secondary | ICD-10-CM | POA: Diagnosis present

## 2022-02-22 DIAGNOSIS — M109 Gout, unspecified: Principal | ICD-10-CM | POA: Diagnosis present

## 2022-02-22 DIAGNOSIS — Z7901 Long term (current) use of anticoagulants: Secondary | ICD-10-CM

## 2022-02-22 DIAGNOSIS — R17 Unspecified jaundice: Secondary | ICD-10-CM | POA: Diagnosis present

## 2022-02-22 DIAGNOSIS — Z950 Presence of cardiac pacemaker: Secondary | ICD-10-CM

## 2022-02-22 DIAGNOSIS — M25462 Effusion, left knee: Secondary | ICD-10-CM | POA: Diagnosis present

## 2022-02-22 DIAGNOSIS — M12861 Other specific arthropathies, not elsewhere classified, right knee: Secondary | ICD-10-CM | POA: Diagnosis present

## 2022-02-22 LAB — CBC WITH DIFFERENTIAL/PLATELET
Abs Immature Granulocytes: 0.03 10*3/uL (ref 0.00–0.07)
Basophils Absolute: 0 10*3/uL (ref 0.0–0.1)
Basophils Relative: 0 %
Eosinophils Absolute: 0 10*3/uL (ref 0.0–0.5)
Eosinophils Relative: 0 %
HCT: 33.3 % — ABNORMAL LOW (ref 39.0–52.0)
Hemoglobin: 10.5 g/dL — ABNORMAL LOW (ref 13.0–17.0)
Immature Granulocytes: 0 %
Lymphocytes Relative: 13 %
Lymphs Abs: 1.2 10*3/uL (ref 0.7–4.0)
MCH: 26.4 pg (ref 26.0–34.0)
MCHC: 31.5 g/dL (ref 30.0–36.0)
MCV: 83.7 fL (ref 80.0–100.0)
Monocytes Absolute: 1.3 10*3/uL — ABNORMAL HIGH (ref 0.1–1.0)
Monocytes Relative: 14 %
Neutro Abs: 6.7 10*3/uL (ref 1.7–7.7)
Neutrophils Relative %: 73 %
Platelets: 390 10*3/uL (ref 150–400)
RBC: 3.98 MIL/uL — ABNORMAL LOW (ref 4.22–5.81)
RDW: 19.3 % — ABNORMAL HIGH (ref 11.5–15.5)
WBC: 9.3 10*3/uL (ref 4.0–10.5)
nRBC: 0 % (ref 0.0–0.2)

## 2022-02-22 LAB — MAGNESIUM: Magnesium: 1.1 mg/dL — ABNORMAL LOW (ref 1.7–2.4)

## 2022-02-22 LAB — COMPREHENSIVE METABOLIC PANEL
ALT: 10 U/L (ref 0–44)
AST: 22 U/L (ref 15–41)
Albumin: 3.7 g/dL (ref 3.5–5.0)
Alkaline Phosphatase: 67 U/L (ref 38–126)
Anion gap: 13 (ref 5–15)
BUN: 23 mg/dL (ref 8–23)
CO2: 32 mmol/L (ref 22–32)
Calcium: 8.8 mg/dL — ABNORMAL LOW (ref 8.9–10.3)
Chloride: 88 mmol/L — ABNORMAL LOW (ref 98–111)
Creatinine, Ser: 1.6 mg/dL — ABNORMAL HIGH (ref 0.61–1.24)
GFR, Estimated: 44 mL/min — ABNORMAL LOW (ref >60)
Glucose, Bld: 104 mg/dL — ABNORMAL HIGH (ref 70–99)
Potassium: 2.2 mmol/L — CL (ref 3.5–5.1)
Sodium: 133 mmol/L — ABNORMAL LOW (ref 135–145)
Total Bilirubin: 2.8 mg/dL — ABNORMAL HIGH (ref 0.3–1.2)
Total Protein: 7.4 g/dL (ref 6.5–8.1)

## 2022-02-22 LAB — SYNOVIAL CELL COUNT + DIFF, W/ CRYSTALS
Lymphocytes-Synovial Fld: 1 % (ref 0–20)
Monocyte-Macrophage-Synovial Fluid: 7 % — ABNORMAL LOW (ref 50–90)
Neutrophil, Synovial: 92 % — ABNORMAL HIGH (ref 0–25)
WBC, Synovial: 40260 /mm3 — ABNORMAL HIGH (ref 0–200)

## 2022-02-22 LAB — URINALYSIS, ROUTINE W REFLEX MICROSCOPIC
Bilirubin Urine: NEGATIVE
Glucose, UA: NEGATIVE mg/dL
Ketones, ur: NEGATIVE mg/dL
Leukocytes,Ua: NEGATIVE
Nitrite: NEGATIVE
Specific Gravity, Urine: 1.014 (ref 1.005–1.030)
pH: 5.5 (ref 5.0–8.0)

## 2022-02-22 LAB — SEDIMENTATION RATE: Sed Rate: 38 mm/hr — ABNORMAL HIGH (ref 0–16)

## 2022-02-22 LAB — BRAIN NATRIURETIC PEPTIDE: B Natriuretic Peptide: 416.4 pg/mL — ABNORMAL HIGH (ref 0.0–100.0)

## 2022-02-22 LAB — PROTIME-INR
INR: 3.9 — ABNORMAL HIGH (ref 0.8–1.2)
Prothrombin Time: 38.1 seconds — ABNORMAL HIGH (ref 11.4–15.2)

## 2022-02-22 LAB — RESP PANEL BY RT-PCR (FLU A&B, COVID) ARPGX2
Influenza A by PCR: NEGATIVE
Influenza B by PCR: NEGATIVE
SARS Coronavirus 2 by RT PCR: NEGATIVE

## 2022-02-22 LAB — LACTIC ACID, PLASMA: Lactic Acid, Venous: 1.5 mmol/L (ref 0.5–1.9)

## 2022-02-22 LAB — APTT: aPTT: 70 seconds — ABNORMAL HIGH (ref 24–36)

## 2022-02-22 LAB — POTASSIUM: Potassium: 2.5 mmol/L — CL (ref 3.5–5.1)

## 2022-02-22 MED ORDER — VANCOMYCIN HCL IN DEXTROSE 1-5 GM/200ML-% IV SOLN
1000.0000 mg | Freq: Once | INTRAVENOUS | Status: AC
  Administered 2022-02-22: 1000 mg via INTRAVENOUS
  Filled 2022-02-22: qty 200

## 2022-02-22 MED ORDER — LACTATED RINGERS IV BOLUS (SEPSIS)
1000.0000 mL | Freq: Once | INTRAVENOUS | Status: AC
  Administered 2022-02-22: 1000 mL via INTRAVENOUS

## 2022-02-22 MED ORDER — POTASSIUM CHLORIDE CRYS ER 20 MEQ PO TBCR
40.0000 meq | EXTENDED_RELEASE_TABLET | Freq: Once | ORAL | Status: AC
  Administered 2022-02-22: 40 meq via ORAL
  Filled 2022-02-22: qty 2

## 2022-02-22 MED ORDER — LIDOCAINE HCL (PF) 1 % IJ SOLN
10.0000 mL | Freq: Once | INTRAMUSCULAR | Status: DC
Start: 2022-02-22 — End: 2022-02-24
  Filled 2022-02-22: qty 10

## 2022-02-22 MED ORDER — MAGNESIUM SULFATE 2 GM/50ML IV SOLN
2.0000 g | Freq: Once | INTRAVENOUS | Status: AC
  Administered 2022-02-22: 2 g via INTRAVENOUS
  Filled 2022-02-22: qty 50

## 2022-02-22 MED ORDER — SODIUM CHLORIDE 0.9 % IV SOLN
2.0000 g | Freq: Once | INTRAVENOUS | Status: AC
  Administered 2022-02-22: 2 g via INTRAVENOUS
  Filled 2022-02-22: qty 20

## 2022-02-22 MED ORDER — POTASSIUM CHLORIDE 10 MEQ/100ML IV SOLN
10.0000 meq | INTRAVENOUS | Status: AC
  Administered 2022-02-22 (×3): 10 meq via INTRAVENOUS
  Filled 2022-02-22 (×3): qty 100

## 2022-02-22 MED ORDER — ACETAMINOPHEN 325 MG PO TABS
650.0000 mg | ORAL_TABLET | Freq: Once | ORAL | Status: AC
  Administered 2022-02-22: 650 mg via ORAL
  Filled 2022-02-22: qty 2

## 2022-02-22 MED ORDER — LACTATED RINGERS IV SOLN: INTRAVENOUS | Status: AC

## 2022-02-22 NOTE — Sepsis Progress Note (Signed)
[facility] is monitoring code sepsis 

## 2022-02-22 NOTE — ED Notes (Signed)
CRITICAL VALUE STICKER  CRITICAL VALUE:K+ 2.2  RECEIVER (on-site recipient of call):Carmon Ginsberg, RN  DATE & TIME NOTIFIED:   MESSENGER (representative from lab):  MD NOTIFIED: P.A. Britni  TIME OF NOTIFICATION:1730  RESPONSE:

## 2022-02-22 NOTE — ED Notes (Signed)
Dr. Karene Fry aware of K level of 2.5

## 2022-02-22 NOTE — ED Triage Notes (Signed)
He reports pain and swelling in bilat. Knees x 3 days. "The pain is so bad I can hardly walk". He is in no distress.

## 2022-02-22 NOTE — ED Notes (Signed)
Dr. Carola Frost at bedside to aspirate fluid from pt's left knee. Approx 40ml of yellow fluid noted. Pt. Tolerated well. Specimen sent to lab.

## 2022-02-22 NOTE — Progress Notes (Signed)
  [facility] will assume care on arrival to accepting facility. Until arrival, care as per EDP. However, [facility] available 24/7 for questions and assistance.   Nursing staff please page [facility] Admits and Consults ([phone]) as soon as the patient arrives to the hospital.  Rayburn Mundis, DO [facility]  

## 2022-02-22 NOTE — ED Provider Notes (Cosign Needed Addendum)
[facility] EMERGENCY DEPT Provider Note   CSN: [id number] Arrival date & time: 02/22/22  1612    History  Chief Complaint  Patient presents with   Knee Pain    [name] is a 76 y.o. male here for evaluation of right knee pain and swelling.  States he has chronic pain to his bilateral knees however over the last 3 to 4 days he has had severe worsening of pain to his right knee with significant swelling.  States significant pain with range of motion.  He has felt warm today however is not taken his temperature.  States he has chronic swelling in his bilateral lower extremities from his CHF however denies any PND orthopnea.  No cough, congestion, rhinorrhea, chest pain, shortness of breath abdominal pain, dysuria, hematuria, diarrhea.  He denies any obvious rashes.  HPI     Home Medications Prior to Admission medications   Medication Sig Start Date End Date Taking? Authorizing Provider  acetaminophen (TYLENOL) 500 MG tablet Take 1,000 mg by mouth every 6 (six) hours as needed for moderate pain. Patient not taking: Reported on 01/26/2022    [provider]  carvedilol (COREG) 6.25 MG tablet Take 1 tablet (6.25 mg total) by mouth 2 (two) times daily with a meal. 12/16/20   Roylene Reason, MD  diclofenac Sodium (VOLTAREN) 1 % GEL Apply 4 g topically 4 (four) times daily. Apply up to 4 times daily as needed for leg pain. 12/29/21 03/05/22  Gwenevere Abbot, MD  ferrous sulfate 325 (65 FE) MG tablet Take 1 tablet (325 mg total) by mouth daily. 12/05/21 03/05/22  Dellis Filbert, MD  guaiFENesin-dextromethorphan (ROBITUSSIN DM) 100-10 MG/5ML syrup Take 5 mLs by mouth every 4 (four) hours as needed for cough. Patient not taking: Reported on 01/05/2022 12/20/21   Park Pope, MD  pantoprazole (PROTONIX) 40 MG tablet TAKE 1 TABLET (40 MG TOTAL) BY MOUTH TWICE A DAY BEFORE MEALS Patient taking differently: Take 40 mg by mouth 2 (two) times daily before a meal. 10/15/21   Dellia Cloud, MD  polyethylene glycol (MIRALAX) 17 g packet Take 17 g by mouth daily as needed. Take as needed, no more than twice daily if experiencing constipation Patient not taking: Reported on 01/26/2022 12/05/21   Dellis Filbert, MD  rosuvastatin (CRESTOR) 10 MG tablet Take 1 tablet (10 mg total) by mouth daily. 12/30/21 12/30/22  Gwenevere Abbot, MD  tamsulosin (FLOMAX) 0.4 MG CAPS capsule Take 2 capsules (0.8 mg total) by mouth daily after breakfast. 08/29/21   Demaio, Alexa, MD  torsemide (DEMADEX) 20 MG tablet Take 1 tablet (20 mg total) by mouth 2 (two) times daily. 12/06/21 03/06/22  Dellis Filbert, MD  warfarin (COUMADIN) 5 MG tablet Take 0.5-1 tablets (2.5-5 mg total) by mouth daily at 8 pm. Take one-half (1/2) of your 5mg  peach-colored warfarin tablet, Monday, Tuesday, Wednesday, Thursday and Friday. On Saturday and Sunday, take one (1) tablet. 02/20/22   Gust Rung, DO      Allergies    Patient has no known allergies.    Review of Systems   Review of Systems  Constitutional: Negative.   HENT: Negative.    Respiratory: Negative.    Cardiovascular:  Positive for leg swelling.  Gastrointestinal: Negative.   Genitourinary: Negative.   Musculoskeletal:        Right knee pain and swelling  Skin: Negative.   All other systems reviewed and are negative.   Physical Exam Updated Vital Signs BP 108/67  Pulse 94   Temp 100.2 F (37.9 C) (Oral)   Resp 20   SpO2 99%  Physical Exam Vitals and nursing note reviewed.  Constitutional:      General: He is not in acute distress.    Appearance: He is well-developed. He is not ill-appearing, toxic-appearing or diaphoretic.  HENT:     Head: Normocephalic and atraumatic.  Eyes:     Pupils: Pupils are equal, round, and reactive to light.  Cardiovascular:     Rate and Rhythm: Normal rate and regular rhythm.     Pulses: Normal pulses.          Radial pulses are 2+ on the right side and 2+ on the left side.       Dorsalis pedis pulses are  2+ on the right side and 2+ on the left side.     Heart sounds: Normal heart sounds.  Pulmonary:     Effort: Pulmonary effort is normal. No respiratory distress.     Breath sounds: Normal breath sounds.  Abdominal:     General: Bowel sounds are normal. There is no distension.     Palpations: Abdomen is soft.  Musculoskeletal:        General: Normal range of motion.     Cervical back: Normal range of motion and neck supple.     Comments: Diffuse tenderness right knee, difficult to range secondary to pain to right knee.  He has significant effusion compared to left knee.  He has some overlying warmth without erythema to his right knee.  Diffuse edema to bilateral lower extremities  Skin:    General: Skin is warm and dry.     Capillary Refill: Capillary refill takes less than 2 seconds.     Comments: No erythema, fluctuance or induration  Neurological:     General: No focal deficit present.     Mental Status: He is alert and oriented to person, place, and time.    ED Results / Procedures / Treatments   Labs (all labs ordered are listed, but only abnormal results are displayed) Labs Reviewed  COMPREHENSIVE METABOLIC PANEL - Abnormal; Notable for the following components:      Result Value   Sodium 133 (*)    Potassium 2.2 (*)    Chloride 88 (*)    Glucose, Bld 104 (*)    Creatinine, Ser 1.60 (*)    Calcium 8.8 (*)    Total Bilirubin 2.8 (*)    GFR, Estimated 44 (*)    All other components within normal limits  CBC WITH DIFFERENTIAL/PLATELET - Abnormal; Notable for the following components:   RBC 3.98 (*)    Hemoglobin 10.5 (*)    HCT 33.3 (*)    RDW 19.3 (*)    Monocytes Absolute 1.3 (*)    All other components within normal limits  PROTIME-INR - Abnormal; Notable for the following components:   Prothrombin Time 38.1 (*)    INR 3.9 (*)    All other components within normal limits  APTT - Abnormal; Notable for the following components:   aPTT 70 (*)    All other components  within normal limits  MAGNESIUM - Abnormal; Notable for the following components:   Magnesium 1.1 (*)    All other components within normal limits  URINALYSIS, ROUTINE W REFLEX MICROSCOPIC - Abnormal; Notable for the following components:   Hgb urine dipstick SMALL (*)    Protein, ur TRACE (*)    All other components within normal limits  BRAIN NATRIURETIC PEPTIDE - Abnormal; Notable for the following components:   B Natriuretic Peptide 416.4 (*)    All other components within normal limits  BODY FLUID CULTURE W GRAM STAIN  RESP PANEL BY RT-PCR (FLU A&B, COVID) ARPGX2  CULTURE, BLOOD (ROUTINE X 2)  CULTURE, BLOOD (ROUTINE X 2)  LACTIC ACID, PLASMA  GLUCOSE, BODY FLUID OTHER            PROTEIN, BODY FLUID (OTHER)  SYNOVIAL CELL COUNT + DIFF, W/ CRYSTALS  URIC ACID, BODY FLUID  SEDIMENTATION RATE  C-REACTIVE PROTEIN  POTASSIUM    EKG EKG Interpretation  Date/Time:  Sunday February 22 2022 17:34:32 EDT Ventricular Rate:  98 PR Interval:    QRS Duration: 105 QT Interval:  412 QTC Calculation: 527 R Axis:   57 Text Interpretation: Atrial fibrillation Paired ventricular premature complexes Prolonged QT interval Confirmed by Ernie Avena ([id number]) on 02/22/2022 6:46:18 PM  Radiology DG Knee Complete 4 Views Right  Result Date: 02/22/2022 CLINICAL DATA:  Right knee pain and swelling. EXAM: RIGHT KNEE - COMPLETE 4+ VIEW COMPARISON:  11/10/2021. FINDINGS: No fracture or bone lesion. Mild to moderate medial and mild lateral joint space compartment narrowing. Minor marginal osteophytes. Mild subchondral cystic change along the dorsal patella. Moderate joint effusion. Mild anterior soft tissue edema. IMPRESSION: 1. No fracture or acute skeletal abnormality. 2. Mild tricompartmental degenerative changes. 3. Moderate joint effusion and mild anterior soft tissue swelling. Electronically Signed   By: Amie Portland M.D.   On: 02/22/2022 17:29   DG Chest Port 1 View  Result Date:  02/22/2022 CLINICAL DATA:  Suspected sepsis. EXAM: PORTABLE CHEST 1 VIEW COMPARISON:  12/25/2021 and older exams. FINDINGS: Stable changes from cardiac valve replacement. Cardiac silhouette is mildly enlarged. Stable left anterior chest wall biventricular cardioverter defibrillator. No mediastinal or hilar masses. Clear lungs.  No convincing pleural effusion.  No pneumothorax. Skeletal structures are grossly intact. IMPRESSION: 1. No acute cardiopulmonary disease. Electronically Signed   By: Amie Portland M.D.   On: 02/22/2022 17:27    Procedures .Critical Care  Performed by: Linwood Dibbles, PA-C Authorized by: Linwood Dibbles, PA-C   Critical care provider statement:    Critical care time (minutes):  76   Critical care was necessary to treat or prevent imminent or life-threatening deterioration of the following conditions:  Sepsis, circulatory failure and metabolic crisis   Critical care was time spent personally by me on the following activities:  Development of treatment plan with patient or surrogate, discussions with consultants, evaluation of patient's response to treatment, examination of patient, ordering and review of laboratory studies, ordering and review of radiographic studies, ordering and performing treatments and interventions, pulse oximetry, re-evaluation of patient's condition and review of old charts .Joint Aspiration/Arthrocentesis  Date/Time: 02/22/2022 10:34 PM  Performed by: Linwood Dibbles, PA-C Authorized by: Linwood Dibbles, PA-C   Consent:    Consent obtained:  Verbal   Consent given by:  Patient   Risks, benefits, and alternatives were discussed: yes     Risks discussed:  Bleeding, infection, pain, poor cosmetic result, nerve damage and incomplete drainage   Alternatives discussed:  No treatment, delayed treatment, alternative treatment, observation and referral Universal protocol:    Procedure explained and questions answered to patient or proxy's  satisfaction: yes     Relevant documents present and verified: yes     Test results available: yes     Imaging studies available: yes     Required blood products,  implants, devices, and special equipment available: yes     Site/side marked: yes     Immediately prior to procedure, a time out was called: yes     Patient identity confirmed:  Verbally with patient Location:    Location:  Knee   Knee:  R knee Anesthesia:    Anesthesia method:  Local infiltration   Local anesthetic:  Lidocaine 1% w/o epi Procedure details:    Preparation: Patient was prepped and draped in usual sterile fashion     Needle gauge:  20 G   Ultrasound guidance: no     Approach:  Lateral   Aspirate amount:  60   Aspirate characteristics:  Cloudy, purulent and yellow   Steroid injected: no     Specimen collected: yes   Post-procedure details:    Dressing:  Adhesive bandage   Procedure completion:  Tolerated well, no immediate complications     Medications Ordered in ED Medications  lidocaine (PF) (XYLOCAINE) 1 % injection 10 mL (has no administration in time range)  lactated ringers infusion ( Intravenous New Bag/Given 02/22/22 1945)  acetaminophen (TYLENOL) tablet 650 mg (650 mg Oral Given 02/22/22 1836)  lactated ringers bolus 1,000 mL (0 mLs Intravenous Stopped 02/22/22 1942)  vancomycin (VANCOCIN) IVPB 1000 mg/200 mL premix (0 mg Intravenous Stopped 02/22/22 1942)  potassium chloride 10 mEq in 100 mL IVPB (0 mEq Intravenous Stopped 02/22/22 2211)  cefTRIAXone (ROCEPHIN) 2 g in sodium chloride 0.9 % 100 mL IVPB (0 g Intravenous Stopped 02/22/22 1942)  magnesium sulfate IVPB 2 g 50 mL (0 g Intravenous Stopped 02/22/22 2024)  potassium chloride SA (KLOR-CON M) CR tablet 40 mEq (40 mEq Oral Given 02/22/22 2218)    ED Course/ Medical Decision Making/ A&P   76 year old with multiple medical problems including A-fib on chronic anticoagulation, s/p ICD placement, CHF, chronic knee pain here for evaluation of  right knee pain and swelling.  States is increased from his baseline.  He has no pain to his left knee according to him currently.  Over last 3 days he has noted increased swelling and pain to his right knee unable to bear weight or range due to pain.  Patient on arrival febrile, tachypneic. Felt warm today.  Right knee with obvious moderate joint effusion, warm however no overlying erythema or evidence of cellulitis.  Concern for septic joint.  His compartments are soft.  He does have diffuse lower extremity swelling however as he states this is from his CHF.  Denies any PND, orthopnea.  Code sepsis called. Given Vanco and Rocephin for possible septic joint.  Joint aspiration obtained prior to blood cultures.  60 cc dark, cloudy, thick, yellow fluid aspirated.  Labs and imaging personally viewed and interpreted:  X-ray right knee with effusion, osteoarthritis X-ray chest without infiltrates, cardiomegaly, pulm edema, pneumothorax EKG without ischemic changes CBC without leukocytosis, hemoglobin 10.5, similar to prior Metabolic panel sodium 133, potassium 2.2, creatinine 1.6, T bili 2.8 BNP 416 COVID/ Flu neg UA neg for infection Lactic 1.5 Magnesium 1.1   Patient here with sepsis likely due to right septic knee.  Fluid culture still running.  He has elevated T. bili at 2.8, prior was 1.6.  He denies any abdominal pain, nausea or vomiting.  I have low suspicion for acute cholecystitis, cholelithiasis, choledocholithiasis as cause of his fever.  And also with significant electrolyte abnormalities which are being replaced IV.  Discussed with Dr. Carola Frost on-call for orthopedics.  Unfortunately due to electrolyte abnormalities needs these need  to be optimized prior to the OR, possibly just needs recurrent aspirations of his knee if unable to take to the OR.  Will follow along with medicine admission.  Prefers [facility] for bed placement  Patient is an internal medicine teaching patient.  According to  [facility] has to be the admitting team and they can transfer care once patient arrives at [facility].  Discussed with Dr. Imogene Burn with [facility].  Agreeable to accept patient in transfer.  We will plan on repeating patient's potassium level after IV potassium.  The patient appears reasonably stabilized for admission considering the current resources, flow, and capabilities available in the ED at this time, and I doubt any other [facility] requiring further screening and/or treatment in the ED prior to admission.                            Medical Decision Making Amount and/or Complexity of Data Reviewed External Data Reviewed: labs, radiology, ECG and notes. Labs: ordered. Decision-making details documented in ED Course. Radiology: ordered and independent interpretation performed. Decision-making details documented in ED Course. ECG/medicine tests: ordered and independent interpretation performed. Decision-making details documented in ED Course.  Risk OTC drugs. Prescription drug management. Parenteral controlled substances. Decision regarding hospitalization.           Final Clinical Impression(s) / ED Diagnoses Final diagnoses:  Sepsis, due to unspecified organism, unspecified whether acute organ dysfunction present [facility])  Pyogenic arthritis of right knee joint, due to unspecified organism (HCC)  Hypokalemia  Hypomagnesemia  Other hypervolemia  Total bilirubin, elevated  Chronic anticoagulation  Atrial fibrillation, unspecified type [facility])    Rx / DC Orders ED Discharge Orders     None             Kamila Broda A, PA-C 02/22/22 2235    Ernie Avena, MD 02/23/22 0140

## 2022-02-23 ENCOUNTER — Encounter (HOSPITAL_COMMUNITY): Payer: Self-pay | Admitting: Internal Medicine

## 2022-02-23 ENCOUNTER — Other Ambulatory Visit: Payer: Self-pay

## 2022-02-23 ENCOUNTER — Ambulatory Visit: Payer: 59

## 2022-02-23 DIAGNOSIS — M12861 Other specific arthropathies, not elsewhere classified, right knee: Secondary | ICD-10-CM | POA: Diagnosis present

## 2022-02-23 DIAGNOSIS — G8929 Other chronic pain: Secondary | ICD-10-CM | POA: Diagnosis present

## 2022-02-23 DIAGNOSIS — Z7901 Long term (current) use of anticoagulants: Secondary | ICD-10-CM | POA: Diagnosis not present

## 2022-02-23 DIAGNOSIS — Z952 Presence of prosthetic heart valve: Secondary | ICD-10-CM | POA: Diagnosis not present

## 2022-02-23 DIAGNOSIS — Z20822 Contact with and (suspected) exposure to covid-19: Secondary | ICD-10-CM | POA: Diagnosis present

## 2022-02-23 DIAGNOSIS — I255 Ischemic cardiomyopathy: Secondary | ICD-10-CM | POA: Diagnosis not present

## 2022-02-23 DIAGNOSIS — N400 Enlarged prostate without lower urinary tract symptoms: Secondary | ICD-10-CM | POA: Diagnosis present

## 2022-02-23 DIAGNOSIS — E876 Hypokalemia: Secondary | ICD-10-CM | POA: Diagnosis present

## 2022-02-23 DIAGNOSIS — I4891 Unspecified atrial fibrillation: Secondary | ICD-10-CM | POA: Diagnosis present

## 2022-02-23 DIAGNOSIS — M25461 Effusion, right knee: Secondary | ICD-10-CM | POA: Diagnosis present

## 2022-02-23 DIAGNOSIS — M109 Gout, unspecified: Secondary | ICD-10-CM | POA: Diagnosis present

## 2022-02-23 DIAGNOSIS — M1990 Unspecified osteoarthritis, unspecified site: Secondary | ICD-10-CM | POA: Diagnosis present

## 2022-02-23 DIAGNOSIS — M009 Pyogenic arthritis, unspecified: Secondary | ICD-10-CM | POA: Diagnosis present

## 2022-02-23 DIAGNOSIS — Z79899 Other long term (current) drug therapy: Secondary | ICD-10-CM | POA: Diagnosis not present

## 2022-02-23 DIAGNOSIS — R17 Unspecified jaundice: Secondary | ICD-10-CM | POA: Diagnosis present

## 2022-02-23 DIAGNOSIS — I440 Atrioventricular block, first degree: Secondary | ICD-10-CM | POA: Diagnosis present

## 2022-02-23 DIAGNOSIS — I472 Ventricular tachycardia, unspecified: Secondary | ICD-10-CM | POA: Diagnosis present

## 2022-02-23 DIAGNOSIS — M549 Dorsalgia, unspecified: Secondary | ICD-10-CM | POA: Diagnosis present

## 2022-02-23 DIAGNOSIS — Z950 Presence of cardiac pacemaker: Secondary | ICD-10-CM | POA: Diagnosis not present

## 2022-02-23 DIAGNOSIS — R790 Abnormal level of blood mineral: Secondary | ICD-10-CM | POA: Diagnosis not present

## 2022-02-23 DIAGNOSIS — I5020 Unspecified systolic (congestive) heart failure: Secondary | ICD-10-CM | POA: Diagnosis not present

## 2022-02-23 DIAGNOSIS — I428 Other cardiomyopathies: Secondary | ICD-10-CM | POA: Diagnosis present

## 2022-02-23 DIAGNOSIS — I130 Hypertensive heart and chronic kidney disease with heart failure and stage 1 through stage 4 chronic kidney disease, or unspecified chronic kidney disease: Secondary | ICD-10-CM | POA: Diagnosis present

## 2022-02-23 DIAGNOSIS — N183 Chronic kidney disease, stage 3 unspecified: Secondary | ICD-10-CM | POA: Diagnosis present

## 2022-02-23 DIAGNOSIS — K219 Gastro-esophageal reflux disease without esophagitis: Secondary | ICD-10-CM | POA: Diagnosis present

## 2022-02-23 DIAGNOSIS — R1310 Dysphagia, unspecified: Secondary | ICD-10-CM | POA: Diagnosis present

## 2022-02-23 DIAGNOSIS — M25462 Effusion, left knee: Secondary | ICD-10-CM | POA: Diagnosis present

## 2022-02-23 LAB — SYNOVIAL CELL COUNT + DIFF, W/ CRYSTALS
Crystals, Fluid: NONE SEEN
Lymphocytes-Synovial Fld: 11 % (ref 0–20)
Monocyte-Macrophage-Synovial Fluid: 1 % — ABNORMAL LOW (ref 50–90)
Neutrophil, Synovial: 88 % — ABNORMAL HIGH (ref 0–25)
WBC, Synovial: 34845 /mm3 — ABNORMAL HIGH (ref 0–200)

## 2022-02-23 LAB — BASIC METABOLIC PANEL
Anion gap: 11 (ref 5–15)
BUN: 20 mg/dL (ref 8–23)
CO2: 31 mmol/L (ref 22–32)
Calcium: 8.5 mg/dL — ABNORMAL LOW (ref 8.9–10.3)
Chloride: 96 mmol/L — ABNORMAL LOW (ref 98–111)
Creatinine, Ser: 1.23 mg/dL (ref 0.61–1.24)
GFR, Estimated: >60 mL/min (ref >60)
Glucose, Bld: 91 mg/dL (ref 70–99)
Potassium: 2.7 mmol/L — CL (ref 3.5–5.1)
Sodium: 138 mmol/L (ref 135–145)

## 2022-02-23 LAB — PROTIME-INR
INR: 4.3 (ref 0.8–1.2)
Prothrombin Time: 41.1 seconds — ABNORMAL HIGH (ref 11.4–15.2)

## 2022-02-23 LAB — C-REACTIVE PROTEIN: CRP: 23.6 mg/dL — ABNORMAL HIGH (ref <1.0)

## 2022-02-23 MED ORDER — ROSUVASTATIN CALCIUM 5 MG PO TABS
10.0000 mg | ORAL_TABLET | Freq: Every day | ORAL | Status: DC
  Administered 2022-02-23 – 2022-02-24 (×2): 10 mg via ORAL
  Filled 2022-02-23 (×2): qty 2

## 2022-02-23 MED ORDER — PREDNISONE 20 MG PO TABS
20.0000 mg | ORAL_TABLET | Freq: Two times a day (BID) | ORAL | Status: DC
  Administered 2022-02-23 – 2022-02-24 (×2): 20 mg via ORAL
  Filled 2022-02-23 (×2): qty 1

## 2022-02-23 MED ORDER — OXYCODONE-ACETAMINOPHEN 5-325 MG PO TABS
1.0000 | ORAL_TABLET | Freq: Once | ORAL | Status: AC
  Administered 2022-02-23: 1 via ORAL
  Filled 2022-02-23: qty 1

## 2022-02-23 MED ORDER — VANCOMYCIN HCL 1250 MG/250ML IV SOLN
1250.0000 mg | INTRAVENOUS | Status: DC
  Administered 2022-02-23: 1250 mg via INTRAVENOUS
  Filled 2022-02-23 (×2): qty 250

## 2022-02-23 MED ORDER — TORSEMIDE 20 MG PO TABS
20.0000 mg | ORAL_TABLET | Freq: Two times a day (BID) | ORAL | Status: DC
  Administered 2022-02-24: 20 mg via ORAL
  Filled 2022-02-23: qty 1

## 2022-02-23 MED ORDER — POTASSIUM CHLORIDE CRYS ER 20 MEQ PO TBCR
40.0000 meq | EXTENDED_RELEASE_TABLET | Freq: Once | ORAL | Status: AC
  Administered 2022-02-23: 40 meq via ORAL
  Filled 2022-02-23: qty 2

## 2022-02-23 MED ORDER — ACETAMINOPHEN 500 MG PO TABS
1000.0000 mg | ORAL_TABLET | Freq: Three times a day (TID) | ORAL | Status: DC
  Administered 2022-02-23 – 2022-02-24 (×3): 1000 mg via ORAL
  Filled 2022-02-23 (×3): qty 2

## 2022-02-23 MED ORDER — TORSEMIDE 20 MG PO TABS: 20.0000 mg | ORAL_TABLET | Freq: Two times a day (BID) | ORAL | Status: DC

## 2022-02-23 MED ORDER — POTASSIUM CHLORIDE 10 MEQ/100ML IV SOLN
10.0000 meq | INTRAVENOUS | Status: DC
  Administered 2022-02-23: 10 meq via INTRAVENOUS
  Filled 2022-02-23 (×2): qty 100

## 2022-02-23 MED ORDER — POTASSIUM CHLORIDE CRYS ER 20 MEQ PO TBCR
40.0000 meq | EXTENDED_RELEASE_TABLET | Freq: Two times a day (BID) | ORAL | Status: AC
  Administered 2022-02-23 – 2022-02-24 (×2): 40 meq via ORAL
  Filled 2022-02-23 (×2): qty 2

## 2022-02-23 MED ORDER — TAMSULOSIN HCL 0.4 MG PO CAPS
0.8000 mg | ORAL_CAPSULE | Freq: Every day | ORAL | Status: DC
  Administered 2022-02-24: 0.8 mg via ORAL
  Filled 2022-02-23: qty 2

## 2022-02-23 MED ORDER — OXYCODONE-ACETAMINOPHEN 5-325 MG PO TABS
1.0000 | ORAL_TABLET | Freq: Once | ORAL | Status: AC
  Administered 2022-02-23: 1 via ORAL

## 2022-02-23 MED ORDER — CARVEDILOL 6.25 MG PO TABS
6.2500 mg | ORAL_TABLET | Freq: Two times a day (BID) | ORAL | Status: DC
  Administered 2022-02-23 – 2022-02-24 (×2): 6.25 mg via ORAL
  Filled 2022-02-23 (×2): qty 1

## 2022-02-23 MED ORDER — OXYCODONE HCL 5 MG PO TABS: 5.0000 mg | ORAL_TABLET | ORAL | Status: DC | PRN

## 2022-02-23 MED ORDER — POTASSIUM CHLORIDE 10 MEQ/100ML IV SOLN
10.0000 meq | INTRAVENOUS | Status: AC
  Administered 2022-02-23 (×4): 10 meq via INTRAVENOUS
  Filled 2022-02-23 (×4): qty 100

## 2022-02-23 MED ORDER — SODIUM CHLORIDE 0.9 % IV SOLN: 2.0000 g | INTRAVENOUS | Status: DC

## 2022-02-23 MED ORDER — OXYCODONE-ACETAMINOPHEN 5-325 MG PO TABS
1.0000 | ORAL_TABLET | ORAL | Status: DC | PRN
  Filled 2022-02-23: qty 1

## 2022-02-23 MED ORDER — PANTOPRAZOLE SODIUM 40 MG PO TBEC
40.0000 mg | DELAYED_RELEASE_TABLET | Freq: Two times a day (BID) | ORAL | Status: DC
  Administered 2022-02-23 – 2022-02-24 (×2): 40 mg via ORAL
  Filled 2022-02-23 (×2): qty 1

## 2022-02-23 NOTE — Plan of Care (Signed)

## 2022-02-23 NOTE — Progress Notes (Signed)
ANTICOAGULATION CONSULT NOTE - Follow Up Consult  Pharmacy Consult for Warfarin Indication: atrial fibrillation  No Known Allergies  Patient Measurements: Height: 5\' 8"  (172.7 cm) Weight: 82.1 kg (181 lb) IBW/kg (Calculated) : 68.4  Vital Signs: Temp: 98.5 F (36.9 C) (08/21 1540) Temp Source: Oral (08/21 1329) BP: 106/69 (08/21 1540) Pulse Rate: 94 (08/21 1540)  Labs: Recent Labs    02/22/22 1645 02/23/22 0821 02/23/22 1152  HGB 10.5*  --   --   HCT 33.3*  --   --   PLT 390  --   --   APTT 70*  --   --   LABPROT 38.1*  --  41.1*  INR 3.9*  --  4.3*  CREATININE 1.60* 1.23  --     Estimated Creatinine Clearance: 51.7 mL/min (by C-G formula based on SCr of 1.23 mg/dL).  Assessment: On warfarin prior to admission for Afib INR 4.3  Goal of Therapy:  INR 2-3 Monitor platelets by anticoagulation protocol: Yes   Plan:  Hold warfarin for now Follow daily INR  Thank you 02/25/22, PharmD  02/23/2022,3:50 PM

## 2022-02-23 NOTE — ED Notes (Addendum)
CRITICAL VALUE STICKER  CRITICAL VALUE: INR 4.3  RECEIVER (on-site recipient of call):Nadege Carriger  DATE & TIME NOTIFIED: 1259  MESSENGER (representative from lab): Sunny  Patient en route to hospital via [facility]. Will call floor to give critical value

## 2022-02-23 NOTE — ED Notes (Signed)
Called to give report , RN unavailable. Call back number for report [phone]

## 2022-02-23 NOTE — H&P (Addendum)
Date: 02/23/2022               Patient Name:  [name] MRN: [id number]  DOB: 1945/10/29 Age / Sex: 76 y.o., male   PCP: Gust Rung, DO         Medical Service: Internal Medicine Teaching Service         Attending Physician: Dr. Orland Mustard, MD    First Contact: Dr. Willette Cluster Pager: [phone]  Second Contact: Dr. Evlyn Kanner Pager: [phone]       After Hours (After 5p/  First Contact Pager: [phone]  weekends / holidays): Second Contact Pager: [phone]   Chief Complaint:  Right knee pain and swelling  History of Present Illness:  The patient presents with 3 weeks of R knee swelling and pain, as well as  6 months of left knee pain and swelling. He thought this was initially due to arthritis as he says similar pain and swelling has occurred in his ankles. He was initially able to walk without assistance but over the last 6 months has required a cane, then a walker, and now over the past few days has been unable to walk due to the pain and also unable to bend his right leg. He denies associated ,fever,chills,CP, SOB, N/V, diarrhea. The patient denies history of gout,trauma or surgery in either knee. He has a history of IV drug use but says he has not used this in years.  Meds:  No outpatient medications have been marked as taking for the 02/22/22 encounter [facility] Encounter).     atorvastatin (LIPITOR) 20 MG tablet, TAKE 1 TABLET BY MOUTH EVERY DAY IN THE EVENING (Patient taking differently: Take 20 mg by mouth daily.)   carvedilol (COREG) 6.25 MG tablet, Take 1 tablet (6.25 mg total) by mouth 2 (two) times daily with a meal.   torsemide (DEMADEX) 20 MG tablet, Take 1 tablet (20 mg total) by mouth 2 (two) times daily.   guaiFENesin-dextromethorphan (ROBITUSSIN DM) 100-10 MG/5ML syrup, Take 5 mLs by mouth every 4 (four) hours as needed for cough.   acetaminophen (TYLENOL) 500 MG tablet, Take 1,000 mg by mouth every 6 (six) hours as needed for moderate pain.    ferrous sulfate 325 (65 FE) MG tablet, Take 1 tablet (325 mg total) by mouth daily.   warfarin (COUMADIN) 5 MG tablet, Take 0.5 tablets (2.5 mg total) by mouth daily at 8 pm. Take 1/2 tablet of warfarin 5 mg tab on Saturday and Sunday. Follow-up in [facility] on Monday 12/08/2021 with Dr. [name]. (Patient taking differently: Take 2.5-5 mg by mouth daily at 8 pm. Take 2.5 mg on Saturday and Sunday and 5 mg on all other days.)   diclofenac Sodium (VOLTAREN) 1 % GEL, Apply 4 g topically 4 (four) times daily. Apply up to 4 times daily as needed for leg pain.   pantoprazole (PROTONIX) 40 MG tablet, TAKE 1 TABLET (40 MG TOTAL) BY MOUTH TWICE A DAY BEFORE MEALS (Patient taking differently: Take 40 mg by mouth 2 (two) times daily before a meal.)   polyethylene glycol (MIRALAX) 17 g packet, Take 17 g by mouth daily as needed. Take as needed, no more than twice daily if experiencing constipation (Patient taking differently: Take 17 g by mouth daily as needed for moderate constipation.)   tamsulosin (FLOMAX) 0.4 MG CAPS capsule, Take 2 capsules (0.8 mg total) by mouth daily after breakfast.  Allergies: Allergies as of 02/22/2022   (No  Known Allergies)   Past Medical History:  Diagnosis Date   Acute GI bleeding 06/27/2021   Anemia 01/29/2016   Atrial fibrillation (HCC)    post op.  s/p dc-cv 04/2008. 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  a- Echo 07/2008 Ef 55% mild peroprosthetic MVR with High transmitral gradient (mean 14).  b- normal coronaries by cath 12/2007   BPH (benign prostatic hyperplasia)    Cardiac resynchronization therapy pacemaker (CRT-P) in place 06/22/2011   CHF (congestive heart failure) (HCC)    EF 35-40 % 2011 due to valvular disease and diastolic dysfunction   Chronic back pain    CKD (chronic kidney disease),  stage III (HCC)    COVID-19 virus infection 07/24/2020   Tested positive on July 16, 2020   Dysphagia    with normal barium swallow 09/2008   Endocarditis    GERD (gastroesophageal reflux disease)    Heavy alcohol use    history   History of atrial flutter 03/13/2008   History of atrial flutter - resolved.    History of GI bleed    Hypertension    IV drug abuse (HCC)    history of   Lower GI bleed 01/2016   Memory impairment 12/24/2015   Pacemaker 2010    Family History:  Family History  Problem Relation Age of Onset   Coronary artery disease Mother    Cancer Father        GI   Cancer Brother        Lung     Social History:  Patient lives in [place] with his 5 granddaughters and two of their mothers. He receives Tree [employer] pensions. He no longer works. He does not use alcohol, tobacco or illicit drugs.   Review of Systems: A complete ROS was negative except as per HPI.   Physical Exam: Blood pressure 110/76, pulse 85, temperature 97.8 F (36.6 C), resp. rate 18, height 5\' 8"  (1.727 m), weight 82.1 kg, SpO2 92 %.  Gen: pleasant, laying in bed,chronically ill appearing,  HEENT: normocephalic, atraumatic, no scleral icterus CV: irregular rhythm, regular rate, no m/r/g Pulm: Normal work of breathing, CTAB Abd: soft, nontender, non distended, normoactive bowel sounds MSK: R knee in ace wrap but minimal ptp with noted fluctuance and without erythema or significant warmth but limited by wrap. L knee with minimal ptp, no warmth, no erythem, fluctuance noted. No ppt,swelling or erytheme of the ankle joints or first mcp joints. Skin: warm, dry, 1+ edema to the ankles bilaterally  EKG: personally reviewed my interpretation is atrial fibrillation with occasional PVCs and no acute ST or T wave changes, prolonged Qtc.  CXR: personally reviewed my interpretation lung fields, no effusions, no pneumothorax, no mediastinal widening, biventricular pacemaker, evidence of  valve replacement, no osseous abnormality.  DG Knee Complete 4 Views Right  Result Date: 02/22/2022 CLINICAL DATA:  Right knee pain and swelling. EXAM: RIGHT KNEE - COMPLETE 4+ VIEW COMPARISON:  11/10/2021. FINDINGS: No fracture or bone lesion. Mild to moderate medial and mild lateral joint space compartment narrowing. Minor marginal osteophytes. Mild subchondral cystic change along the dorsal patella. Moderate joint effusion. Mild anterior soft tissue edema. IMPRESSION: 1. No fracture or acute skeletal abnormality. 2. Mild tricompartmental degenerative changes. 3. Moderate joint effusion and mild anterior soft tissue swelling. Electronically  Signed   By: Lajean Manes M.D.   On: 02/22/2022 17:29   Assessment & Plan by Problem: Principal Problem:   Septic arthritis of knee, right [facility])  Mr. Reisenauer is a 76 y/o male with a pmh of HFrEF, CKD 3, GI bleed, Valvular afib on warfarin, IV drug use c/b endocarditis, presenting for 3 days of worsening right knee and swelling c/f gout vs septic arthritis.  Acute R Knee gouty arthropathy  Chronic knee arthritis Patient reports 3 weeks of new onset and worsening right knee pain and swelling, and 6 months of left knee pain and swelling. He presented to ortho as he has recently not been able to walk do to the pain in his R knee. He denies fever, chills, trauma to the knee, prior surgery, current IV drug use. ED tapped the R knee yesterday due to warmth and pain and aspirate was found to be yellow-cloudy with 40K wbc and MSU crystals. L knee aspiration was performed by Dr. Marcelino Scot ortho at [facility] and 95 ml of aspirate demonstrated yellow-cloudy, 34.8K wbc without crystals. CBC was without leukocytosis, the patient has remained afebrile, blood cultures are without growth, aspirate cultures are without growth, and aspirate wbc counts are <50K so do not suspect septic arthritis. Also, knee radiograph was without an gas but did show mild degenerative changes. Given  elevated CRP, increased WBCs and MSU on the right knee tap do suspect this is acute gouty arthritis. On exam the bilateral knees are without much ptp, are not erythematous and are not hot, which does not quite fit the expected clinical picture of gout. Will discontinue vanc at this time and will continue with oral steroids given hx of GI and CKD and follow aspirate culture results. - Prednisone 20 mg BID x 3 days -Tylenol 1000mg  PO TID - oxy 2.5 mg prn - F/U bcx, knee aspirate culture  Hypokalemia Potassium of 2.7 and magnesium of 1.1. Suspect this is secondary to torsemide use. Will monitor and replete as needed -Hold torsemide 20mg  BID -Daily BMP, Mg -Consider outpatient spironolactone  CKD 3 Baseline creatinine appears to be 1.8-2.0. Creatinine today found to be 1.23.  -Avoid NSAIDs -Avoid contrast and other nephrotoxic agents  GERD -Continue Protonix 40mg  BID  HFrEF 2/2 nonischemic cardiomyopathy -Continue torsemide 20 mg twice daily -Continue Coreg 6.25 mg twice daily  Valvular Afib  Mechanical mitral valve replacement INR 4.3 today. -Continue Coreg  -Continue warfarin, pharmacy dosing   BPH -Continue tamsulosin 0.8mg  daily  Dispo: Admit patient to Inpatient with expected length of stay greater than 2 midnights.  Signed: Iona Coach, MD 02/23/2022, 2:30 PM  Pager: [phone] After 5pm on weekdays and 1pm on weekends: On Call pager: [phone]

## 2022-02-23 NOTE — Progress Notes (Signed)
Orthopaedic Trauma Service   Synovial fluid analysis of right and left knee consistent with inflammatory arthropathy (Gout).  L knee with MSU crystals, no crystals see in R knee fluid analysis   Gram stains show no organisms  Await cultures  No role for OR at this time   Recommend PT/OT evals WBAT B LEx ROM as tolerated both knees  Ice   Provided cultures are negative we could proceed with intra-articular steroid injections for both knees. Alternatively could do oral steroids 20 mg po BID x 3 days and tapering over 10 days  Given history of CKD and GI bleed would not use colchicine or NSAIDs  Mearl Latin, PA-C [phone] (C) 02/23/2022, 10:28 AM  [facility] [address] [place] [address] [phone] (F)        Patient ID: [name], male   DOB: [birth year], 76 y.o.   MRN: [id number]

## 2022-02-23 NOTE — Progress Notes (Signed)
CTM notified this RN for a 6-beat-run of Vtach.  Patient is currently eating dinner, no complaints offered.  Potassium IV replacement in progress.  Dr. Marijo Conception made aware.

## 2022-02-23 NOTE — Progress Notes (Signed)
Pharmacy Antibiotic Note  [name] is a 76 y.o. male admitted on 02/22/2022 with sepsis and cellulitis.  Pharmacy has been consulted for vancomycin dosing. Pt is febrile with Tmax 102. WBC is WNL. SCr has improved to 1.23 and lactic acid is <2.   Plan: Vancomycin 1250mg  IV Q24H  F/u renal fxn, C&S, clinical status and peak/trough at SS  Height: 5\' 8"  (172.7 cm) Weight: 82.1 kg (181 lb) IBW/kg (Calculated) : 68.4  Temp (24hrs), Avg:100.5 F (38.1 C), Min:99.4 F (37.4 C), Max:102 F (38.9 C)  Recent Labs  Lab 02/22/22 1645 02/23/22 0821  WBC 9.3  --   CREATININE 1.60* 1.23  LATICACIDVEN 1.5  --     Estimated Creatinine Clearance: 51.7 mL/min (by C-G formula based on SCr of 1.23 mg/dL).    No Known Allergies  Antimicrobials this admission: Vanc 8/20>> CTX 8/20>>  Dose adjustments this admission: N/A  Microbiology results: Pending  Thank you for allowing pharmacy to be a part of this patient's care.  Laresa Oshiro, 02/24/22 02/23/2022 9:20 AM

## 2022-02-24 ENCOUNTER — Ambulatory Visit (INDEPENDENT_AMBULATORY_CARE_PROVIDER_SITE_OTHER): Payer: 59

## 2022-02-24 DIAGNOSIS — E876 Hypokalemia: Secondary | ICD-10-CM

## 2022-02-24 DIAGNOSIS — I255 Ischemic cardiomyopathy: Secondary | ICD-10-CM

## 2022-02-24 DIAGNOSIS — R790 Abnormal level of blood mineral: Secondary | ICD-10-CM

## 2022-02-24 DIAGNOSIS — Z87891 Personal history of nicotine dependence: Secondary | ICD-10-CM

## 2022-02-24 DIAGNOSIS — I5022 Chronic systolic (congestive) heart failure: Secondary | ICD-10-CM

## 2022-02-24 DIAGNOSIS — I5020 Unspecified systolic (congestive) heart failure: Secondary | ICD-10-CM

## 2022-02-24 DIAGNOSIS — M109 Gout, unspecified: Principal | ICD-10-CM

## 2022-02-24 LAB — GLUCOSE, BODY FLUID OTHER: Glucose, Body Fluid Other: 12 mg/dL

## 2022-02-24 LAB — BASIC METABOLIC PANEL
Anion gap: 12 (ref 5–15)
BUN: 22 mg/dL (ref 8–23)
CO2: 26 mmol/L (ref 22–32)
Calcium: 8.9 mg/dL (ref 8.9–10.3)
Chloride: 98 mmol/L (ref 98–111)
Creatinine, Ser: 1.33 mg/dL — ABNORMAL HIGH (ref 0.61–1.24)
GFR, Estimated: 55 mL/min — ABNORMAL LOW (ref >60)
Glucose, Bld: 120 mg/dL — ABNORMAL HIGH (ref 70–99)
Potassium: 3.5 mmol/L (ref 3.5–5.1)
Sodium: 136 mmol/L (ref 135–145)

## 2022-02-24 LAB — CBC
HCT: 31.2 % — ABNORMAL LOW (ref 39.0–52.0)
Hemoglobin: 9.8 g/dL — ABNORMAL LOW (ref 13.0–17.0)
MCH: 26.6 pg (ref 26.0–34.0)
MCHC: 31.4 g/dL (ref 30.0–36.0)
MCV: 84.6 fL (ref 80.0–100.0)
Platelets: 409 10*3/uL — ABNORMAL HIGH (ref 150–400)
RBC: 3.69 MIL/uL — ABNORMAL LOW (ref 4.22–5.81)
RDW: 19.1 % — ABNORMAL HIGH (ref 11.5–15.5)
WBC: 7.5 10*3/uL (ref 4.0–10.5)
nRBC: 0 % (ref 0.0–0.2)

## 2022-02-24 LAB — URIC ACID, BODY FLUID: Uric Acid Body Fluid: 12.2 mg/dL

## 2022-02-24 LAB — PROTIME-INR
INR: 4.6 (ref 0.8–1.2)
Prothrombin Time: 43.3 seconds — ABNORMAL HIGH (ref 11.4–15.2)

## 2022-02-24 LAB — MAGNESIUM: Magnesium: 1.6 mg/dL — ABNORMAL LOW (ref 1.7–2.4)

## 2022-02-24 MED ORDER — MAGNESIUM SULFATE 2 GM/50ML IV SOLN
2.0000 g | Freq: Once | INTRAVENOUS | Status: AC
  Administered 2022-02-24: 2 g via INTRAVENOUS
  Filled 2022-02-24: qty 50

## 2022-02-24 MED ORDER — POTASSIUM CHLORIDE CRYS ER 20 MEQ PO TBCR: 20.0000 meq | EXTENDED_RELEASE_TABLET | Freq: Every day | ORAL | 0 refills | Status: AC

## 2022-02-24 MED ORDER — POTASSIUM CHLORIDE 20 MEQ PO PACK
20.0000 meq | PACK | Freq: Two times a day (BID) | ORAL | Status: DC
  Administered 2022-02-24: 20 meq via ORAL
  Filled 2022-02-24: qty 1

## 2022-02-24 MED ORDER — WARFARIN SODIUM 5 MG PO TABS: 2.5000 mg | ORAL_TABLET | Freq: Every day | ORAL | 0 refills | Status: AC

## 2022-02-24 MED ORDER — PREDNISONE 20 MG PO TABS: 20.0000 mg | ORAL_TABLET | Freq: Every day | ORAL | 0 refills | Status: AC

## 2022-02-24 NOTE — Progress Notes (Signed)
Dr. Aundria Rud talked to [name] about changes in patient's medications.  [name] verbalized understanding, she takes care of patient's medications.  Patient discharged home.

## 2022-02-24 NOTE — Evaluation (Signed)
Physical Therapy Evaluation Patient Details Name: [name] MRN: [id number] DOB: [birth year] Today's Date: 02/24/2022  History of Present Illness  Pt is 76 yo male admitted 02/22/22 with 3 day worsening of R knee pain and swelling.  Per notes gout vs septic arthritis.  Pt with hx including but not limited to afib, pacemaker, CHF, back pain, HTN  Clinical Impression  Pt admitted with above diagnosis. At baseline, pt is independent with use of cane for the past few months due to knee pain.  Prior to the last couple months reports no AD needs.  Today, pt reports knee pain has improved and was able to tolerate 200' ambulation with cane and performed 5 steps with rails.  Pt has family support and DME at home.  Pt with mild deficits that will benefit from acute PT.  Could benefit from outpt PT to advance back to no AD if desired and able. Pt currently with functional limitations due to the deficits listed below (see PT Problem List). Pt will benefit from skilled PT to increase their independence and safety with mobility to allow discharge to the venue listed below.          Recommendations for follow up therapy are one component of a multi-disciplinary discharge planning process, led by the attending physician.  Recommendations may be updated based on patient status, additional functional criteria and insurance authorization.  Follow Up Recommendations Outpatient PT      Assistance Recommended at Discharge Intermittent Supervision/Assistance  Patient can return home with the following  Help with stairs or ramp for entrance    Equipment Recommendations None recommended by PT  Recommendations for Other Services       Functional Status Assessment Patient has had a recent decline in their functional status and demonstrates the ability to make significant improvements in function in a reasonable and predictable amount of time. (mild deficits)     Precautions / Restrictions  Precautions Precautions: Fall Restrictions RLE Weight Bearing: Weight bearing as tolerated LLE Weight Bearing: Weight bearing as tolerated      Mobility  Bed Mobility Overal bed mobility: Independent             General bed mobility comments: Sitting EOB at arrival    Transfers Overall transfer level: Needs assistance Equipment used: Straight cane Transfers: Sit to/from Stand Sit to Stand: Supervision           General transfer comment: Performed x 4 during session    Ambulation/Gait Ambulation/Gait assistance: Min guard, Supervision Gait Distance (Feet): 200 Feet Assistive device: Straight cane Gait Pattern/deviations: Step-through pattern, Decreased stride length Gait velocity: decreased     General Gait Details: Very mild antalgic patter; min guard progressing to supervision  Stairs Stairs: Yes Stairs assistance: Min guard Stair Management: One rail Left, With cane, Forwards, Step to pattern Number of Stairs: 5 General stair comments: Cues for "up with good , down with bad"; using rail on L and cane on R  Wheelchair Mobility    Modified Rankin (Stroke Patients Only)       Balance Overall balance assessment: Needs assistance Sitting-balance support: No upper extremity supported Sitting balance-Leahy Scale: Good     Standing balance support: Single extremity supported, No upper extremity supported Standing balance-Leahy Scale: Good Standing balance comment: Cane to ambulate but could static stand without AD  Pertinent Vitals/Pain Pain Assessment Pain Assessment: 0-10 Pain Score: 3  Pain Location: R knee Pain Descriptors / Indicators: Sore Pain Intervention(s): Monitored during session, Limited activity within patient's tolerance    Home Living Family/patient expects to be discharged to:: Private residence Living Arrangements: Children Available Help at Discharge: Family;Available 24 hours/day Type  of Home: House Home Access: Stairs to enter Entrance Stairs-Rails: Right Entrance Stairs-Number of Steps: 2   Home Layout: One level Home Equipment: Cane - single Librarian, academic (2 wheels);Adaptive equipment;Hand held shower head Additional Comments: [name] Daugther and 5 kids    Prior Function Prior Level of Function : Independent/Modified Independent;Driving             Mobility Comments: Uses cane for ambulation - pt with increasing knee pain over last few months requiring use of cane; previously no AD ADLs Comments: indep bathing / dressing/ no cleaning     Hand Dominance   Dominant Hand: Right    Extremity/Trunk Assessment   Upper Extremity Assessment Upper Extremity Assessment: Defer to OT evaluation    Lower Extremity Assessment Lower Extremity Assessment: LLE deficits/detail;RLE deficits/detail RLE Deficits / Details: ROM: lacking 5 degrees knee ext, otherwise WFL; MMT: ankle 5/5, hip 5/5, knee 4/5; limited by pain LLE Deficits / Details: ROM WFL; MMT 5/5    Cervical / Trunk Assessment Cervical / Trunk Assessment: Normal  Communication   Communication: No difficulties  Cognition Arousal/Alertness: Awake/alert Behavior During Therapy: WFL for tasks assessed/performed Overall Cognitive Status: No family/caregiver present to determine baseline cognitive functioning                                 General Comments: Pt with some confusion in regards to lines/leads        General Comments General comments (skin integrity, edema, etc.): VSS    Exercises     Assessment/Plan    PT Assessment Patient needs continued PT services  PT Problem List Decreased strength;Decreased mobility;Decreased range of motion;Pain;Decreased balance;Decreased knowledge of use of DME       PT Treatment Interventions DME instruction;Therapeutic activities;Gait training;Therapeutic exercise;Patient/family education;Stair training;Balance training;Functional  mobility training    PT Goals (Current goals can be found in the Care Plan section)  Acute Rehab PT Goals Patient Stated Goal: return home PT Goal Formulation: With patient Time For Goal Achievement: 03/10/22 Potential to Achieve Goals: Good    Frequency Min 3X/week     Co-evaluation               AM-PAC PT "6 Clicks" Mobility  Outcome Measure Help needed turning from your back to your side while in a flat bed without using bedrails?: None Help needed moving from lying on your back to sitting on the side of a flat bed without using bedrails?: None Help needed moving to and from a bed to a chair (including a wheelchair)?: A Little Help needed standing up from a chair using your arms (e.g., wheelchair or bedside chair)?: A Little Help needed to walk in hospital room?: A Little Help needed climbing 3-5 steps with a railing? : A Little 6 Click Score: 20    End of Session Equipment Utilized During Treatment: Gait belt Activity Tolerance: Patient tolerated treatment well Patient left: in bed;with call bell/phone within reach;with bed alarm set (sitting EOB to eat) Nurse Communication: Mobility status PT Visit Diagnosis: Other abnormalities of gait and mobility (R26.89);Pain    Time: 1202-1221 PT Time Calculation (  min) (ACUTE ONLY): 19 min   Charges:   PT Evaluation $PT Eval Low Complexity: 1 Low          Burgess Sheriff, PT Acute Rehab [facility] Rehab [phone]   Rayetta Humphrey 02/24/2022, 1:35 PM

## 2022-02-24 NOTE — Progress Notes (Signed)
Discharge instructions given to patient.  Patient verbalized understanding.  Encouraged to call the doctor for questions.  Discharged home. 

## 2022-02-24 NOTE — Discharge Summary (Addendum)
Name: [name] MRN: [id number] DOB: [birth year] 76 y.o. PCP: Gust Rung, DO  Date of Admission: 02/22/2022  4:17 PM Date of Discharge: 02/24/2022 Attending Physician: Miguel Aschoff, MD  Discharge Diagnosis: 1. Principal Problem:   Inflammatory arthropathy (acute gout of R knee)   Discharge Medications: Allergies as of 02/24/2022   No Known Allergies      Medication List     STOP taking these medications    diclofenac Sodium 1 % Gel Commonly known as: Voltaren       TAKE these medications    acetaminophen 500 MG tablet Commonly known as: TYLENOL Take 1,000 mg by mouth every 6 (six) hours as needed for moderate pain.   atorvastatin 20 MG tablet Commonly known as: LIPITOR Take 20 mg by mouth every evening.   carvedilol 6.25 MG tablet Commonly known as: COREG Take 1 tablet (6.25 mg total) by mouth 2 (two) times daily with a meal.   ferrous sulfate 325 (65 FE) MG tablet Take 1 tablet (325 mg total) by mouth daily.   pantoprazole 40 MG tablet Commonly known as: PROTONIX TAKE 1 TABLET (40 MG TOTAL) BY MOUTH TWICE A DAY BEFORE MEALS What changed: See the new instructions.   polyethylene glycol 17 g packet Commonly known as: MiraLax Take 17 g by mouth daily as needed. Take as needed, no more than twice daily if experiencing constipation   potassium chloride SA 20 MEQ tablet Commonly known as: KLOR-CON M Take 1 tablet (20 mEq total) by mouth daily for 30 doses.   predniSONE 20 MG tablet Commonly known as: DELTASONE Take 1 tablet (20 mg total) by mouth daily.   rosuvastatin 10 MG tablet Commonly known as: Crestor Take 1 tablet (10 mg total) by mouth daily.   tamsulosin 0.4 MG Caps capsule Commonly known as: FLOMAX Take 2 capsules (0.8 mg total) by mouth daily after breakfast.   torsemide 20 MG tablet Commonly known as: DEMADEX Take 1 tablet (20 mg total) by mouth 2 (two) times daily.   warfarin 5 MG tablet Commonly known as:  COUMADIN Take as directed. If you are unsure how to take this medication, talk to your nurse or doctor. Original instructions: Take 0.5 tablets (2.5 mg total) by mouth daily at 8 pm. Take one-half (1/2) of your 5mg  peach-colored warfarin tablet, Monday, Tuesday, Wednesday, Hubbard and Friday. On Saturday and Sunday, take one (1) tablet. Start taking on: February 25, 2022 What changed: how much to take        Disposition and follow-up:   [name] was discharged from [facility] in Good condition.  At the hospital follow up visit please address:  1.   Hypokalemia  Asymptomatic non-sustained Vtach -restarted torsemide 20 mg BID given crackles and 4.3L net volume in hospital -started on [facility] daily potassium supplement -consider spironolactone vs oral supplementation given HFrEF if BP can tolerate -repeat BMP, Mg  Supratherapeutic INR -restart Warfarin 2.5 mg on Wednesday 8/23 until seen in clinic on Monday -recheck INR -will need to see [name]  Acute gout flare of the R knee -discharged with 5 days prednisone PO 20 mg starting 8/23 -F/U uric acid level - Consider uric acid reducing medication once past the flare  HFrEF 2/2 nonischemic cardiomyopathy -recheck volume status  2.  Labs / imaging needed at time of follow-up: BMP,Mg, INR, CBC  3.  Pending labs/ test needing follow-up: uric acid level  Follow-up Appointments: [facility] internal medicine- August 31st,  2023 at 9:45 AM with Dr. [facility] Course by problem list: [name] is a 76 y/o male with a pmh of HFrEF, CKD 3, GI bleed, Valvular afib on warfarin, IV drug use c/b endocarditis, presenting for 3 days of worsening right knee and swelling which was treated as an acute gout flair during hospitalization.   Acute R Knee gouty arthropathy  Chronic knee arthritis Patient reports 3 weeks of new onset and worsening right knee pain and swelling. ED tapped the R knee yesterday due to warmth  and pain and aspirate was found to be yellow-cloudy with 40K wbc and MSU crystals. CBC was without leukocytosis, the patient has remained afebrile, blood cultures are without growth, aspirate cultures are without growth, and aspirate wbc counts are <50K so do not suspect septic arthritis. Given elevated CRP, increased WBCs and MSU on the right knee tap do suspect this is acute gouty arthritis. The patient was on the medicine floor for 1 day and was without pain, able to ambulate, and asking to go home.Patient was treated with 2 days of PO prednisone BID during the admission and will be discharged with 5 days of prednisone 20mg  PO daily.   Hypokalemia  non-sustained vtach Potassium of 2.2 on discharge and magnesium of 1.1 in the setting of suspected hypovolemia and diuretic use.After holding torsemide, IVF and repletion K+ was 3.5 and Mg+ was 1.6 day of discharge. Patient had asymptomatic non-sustained vtach in the setting of structural heart disease and hypokalemia.Will restart torsemide given large volume of IVF and crackles at lung bases on exam with potassium repletion daily.    CKD 3 Baseline creatinine appears to be 1.8-2.0. Creatinine day of discharge found to be 1.33, decreased from 1.6 on discharge after getting +4.48L of net fluid intake and holding torsemide during admission.   GERD Continued Protonix 40mg  BID.   HFrEF 2/2 nonischemic cardiomyopathy Continued torsemide 20 mg twice daily and continued Coreg 6.25 mg twice daily.   Valvular Afib  Mechanical mitral valve replacement INR 4.3 today. Continue Coreg and Continued warfarin with pharmacy dosing. INR day of discharge was 4.6. Will plan for close follow up in the outpatient setting.   BPH Continued tamsulosin 0.8mg  daily  Discharge Exam:   BP 104/67 (BP Location: Left Leg)   Pulse 80   Temp 98.5 F (36.9 C) (Oral)   Resp 18   Ht 5\' 8"  (1.727 m)   Wt 83.7 kg   SpO2 93%   BMI 28.06 kg/m  Discharge exam:   GEN:  pleasant, up and walking, general well appearing HEENT: normocephalic, atraumatic, mucous membranes moist PULM: Crackles at the bilateral bases, good airflow throughout CV: regular rate, irregular rhythm, no JVD, pulses 2+ ABD: normoactive bowel sounds, soft, nontender, nondistended MSK: Improving R knee effusion without ptp, erythema, warmth. Stable left knee effusion without pain, erythema warmth. No pain or swelling of ankle joints or foot joints. SKIN: No LE edema, warm and dry  Pertinent Labs, Studies, and Procedures:     Latest Ref Rng & Units 02/24/2022    2:11 AM 02/22/2022    4:45 PM 12/28/2021    2:05 AM  CBC  WBC 4.0 - 10.5 K/uL 7.5  9.3  8.4   Hemoglobin 13.0 - 17.0 g/dL 9.8  02/26/2022  02/24/2022   Hematocrit 39.0 - 52.0 % 31.2  33.3  40.8   Platelets 150 - 400 K/uL 409  390  428        Latest Ref Rng &  Units 02/24/2022    2:11 AM 02/23/2022    8:21 AM 02/22/2022   10:03 PM  CMP  Glucose 70 - 99 mg/dL 102  91    BUN 8 - 23 mg/dL 22  20    Creatinine 1.11 - 1.24 mg/dL 7.35  6.70    Sodium 141 - 145 mmol/L 136  138    Potassium 3.5 - 5.1 mmol/L 3.5  2.7  2.5   Chloride 98 - 111 mmol/L 98  96    CO2 22 - 32 mmol/L 26  31    Calcium 8.9 - 10.3 mg/dL 8.9  8.5     DG Knee Complete 4 Views Right  Result Date: 02/22/2022 CLINICAL DATA:  Right knee pain and swelling. EXAM: RIGHT KNEE - COMPLETE 4+ VIEW COMPARISON:  11/10/2021. FINDINGS: No fracture or bone lesion. Mild to moderate medial and mild lateral joint space compartment narrowing. Minor marginal osteophytes. Mild subchondral cystic change along the dorsal patella. Moderate joint effusion. Mild anterior soft tissue edema. IMPRESSION: 1. No fracture or acute skeletal abnormality. 2. Mild tricompartmental degenerative changes. 3. Moderate joint effusion and mild anterior soft tissue swelling. Electronically Signed   By: Amie Portland M.D.   On: 02/22/2022 17:29   DG Chest Port 1 View  Result Date: 02/22/2022 CLINICAL DATA:  Suspected  sepsis. EXAM: PORTABLE CHEST 1 VIEW COMPARISON:  12/25/2021 and older exams. FINDINGS: Stable changes from cardiac valve replacement. Cardiac silhouette is mildly enlarged. Stable left anterior chest wall biventricular cardioverter defibrillator. No mediastinal or hilar masses. Clear lungs.  No convincing pleural effusion.  No pneumothorax. Skeletal structures are grossly intact. IMPRESSION: 1. No acute cardiopulmonary disease. Electronically Signed   By: Amie Portland M.D.   On: 02/22/2022 17:27     Discharge Instructions: Discharge Instructions     Diet - low sodium heart healthy   Complete by: As directed    Discharge instructions   Complete by: As directed    You were hospitalized because you had an acute gout flare that caused swelling and pain in your knee. You were treated with oral steroids and will continue these at home. You were found in the hospital to have high levels of warfarin. This was stopped during your stay and will be restarted 02/25/2022. Your potassium was also low in the hospital and this was replaced. You will start taking supplementations for this.  MEDICATION CHANGES: START: Prednisone 20mg  daily by mouth for 5 days starting 02/25/2022 Potassium 02/27/2022 once daily by mouth  CHANGE: Take Warfarin 2.5 mg (1/2 pill) daily by mouth starting 02/25/2022   Increase activity slowly   Complete by: As directed        Signed: 02/27/2022, MD 02/24/2022, 2:54 PM   Pager: [phone]

## 2022-02-24 NOTE — Evaluation (Signed)
Occupational Therapy Evaluation Patient Details Name: [name] MRN: [id number] DOB: [birth year] Today's Date: 02/24/2022   History of Present Illness 76 year old man wo was admitted 12/25/21 for acute exacerbation of CHF. Pt chief complaints are L LE "muscle pain," cough, and SOB. PMH: CHF, a fib, mechanical mitral valve replacement, CKD, BPH, HTN, etc.   Clinical Impression   Patient evaluated by Occupational Therapy with no further acute OT needs identified. All education has been completed and the patient has no further questions. See below for any follow-up Occupational Therapy or equipment needs. OT to sign off. Thank you for referral.        Recommendations for follow up therapy are one component of a multi-disciplinary discharge planning process, led by the attending physician.  Recommendations may be updated based on patient status, additional functional criteria and insurance authorization.   Follow Up Recommendations  No OT follow up    Assistance Recommended at Discharge None  Patient can return home with the following Assistance with cooking/housework;Assist for transportation;Direct supervision/assist for medications management    Functional Status Assessment  Patient has had a recent decline in their functional status and demonstrates the ability to make significant improvements in function in a reasonable and predictable amount of time.  Equipment Recommendations  None recommended by OT    Recommendations for Other Services       Precautions / Restrictions Precautions Precautions: Fall Restrictions Weight Bearing Restrictions: Yes RLE Weight Bearing: Weight bearing as tolerated LLE Weight Bearing: Weight bearing as tolerated      Mobility Bed Mobility Overal bed mobility: Independent                  Transfers Overall transfer level: Independent                 General transfer comment: pushign with bil UE from bed surface and using RW.  pt reports having RW at home      Balance Overall balance assessment: Mild deficits observed, not formally tested (pt using RW and normally prefers cane)                                         ADL either performed or assessed with clinical judgement   ADL Overall ADL's : Modified independent                                       General ADL Comments: completed bed mobility, toilet transfer, chair transfer, sink level grooming, can reach LB in seated position. has family (A) at home as needed per pt. pt has 5 young grandchildren in the home.     Vision Baseline Vision/History: 1 Wears glasses Additional Comments: has them but hasnt been using them     Perception     Praxis      Pertinent Vitals/Pain Pain Assessment Pain Assessment: 0-10 Pain Score: 3  Pain Descriptors / Indicators: Sore Pain Intervention(s): Monitored during session, Repositioned     Hand Dominance Right   Extremity/Trunk Assessment Upper Extremity Assessment Upper Extremity Assessment: Overall WFL for tasks assessed   Lower Extremity Assessment Lower Extremity Assessment: RLE deficits/detail;LLE deficits/detail LLE Deficits / Details: L LE started with pain at toe then ankle then knee then moved to the R LE   Cervical / Trunk Assessment Cervical /  Trunk Assessment: Normal   Communication Communication Communication: No difficulties   Cognition Arousal/Alertness: Awake/alert Behavior During Therapy: WFL for tasks assessed/performed Overall Cognitive Status: No family/caregiver present to determine baseline cognitive functioning                                 General Comments: pt with vague recall of information. pt states "i think" when speaking of home setup     General Comments       Exercises     Shoulder Instructions      Home Living Family/patient expects to be discharged to:: Private residence Living Arrangements:  Children Available Help at Discharge: Family;Available 24 hours/day Type of Home: House Home Access: Stairs to enter [employer] of Steps: 2 Entrance Stairs-Rails: Right Home Layout: One level     Bathroom Shower/Tub: Chief Strategy Officer: Standard     Home Equipment: Cane - single Librarian, academic (2 wheels);Adaptive equipment;Hand held shower head (urinal) Adaptive Equipment: Reacher Additional Comments: [name] Dauther 5 kids      Prior Functioning/Environment Prior Level of Function : Independent/Modified Independent;Driving (drives a moped- once a week)             Mobility Comments: Uses cane for ambulation ADLs Comments: indep bathing / dressing/ no cleaning        OT Problem List:        OT Treatment/Interventions:      OT Goals(Current goals can be found in the care plan section) Acute Rehab OT Goals Patient Stated Goal: to go home today  OT Frequency:      Co-evaluation              AM-PAC OT "6 Clicks" Daily Activity     Outcome Measure Help from another person eating meals?: None Help from another person taking care of personal grooming?: None Help from another person toileting, which includes using toliet, bedpan, or urinal?: None Help from another person bathing (including washing, rinsing, drying)?: None Help from another person to put on and taking off regular upper body clothing?: None Help from another person to put on and taking off regular lower body clothing?: None 6 Click Score: 24   End of Session Equipment Utilized During Treatment: Gait belt;Rolling walker (2 wheels) Nurse Communication: Mobility status;Precautions  Activity Tolerance: Patient tolerated treatment well Patient left: in chair;with call bell/phone within reach;with chair alarm set  OT Visit Diagnosis: Unsteadiness on feet (R26.81)                Time: 1032-1100 OT Time Calculation (min): 28 min Charges:  OT General Charges $OT Visit: 1  Visit OT Evaluation $OT Eval Moderate Complexity: 1 Mod   Brynn, OTR/L  Acute Rehabilitation Services Office: [phone] .   Mateo Flow 02/24/2022, 11:03 AM

## 2022-02-24 NOTE — Hospital Course (Signed)
[name] is a 76 y/o male with a pmh of HFrEF, CKD 3, GI bleed, Valvular afib on warfarin, IV drug use c/b endocarditis, presenting for 3 days of worsening right knee and swelling which was treated as an acute gout flair during hospitalization.   Acute R Knee gouty arthropathy  Chronic knee arthritis Patient reports 3 weeks of new onset and worsening right knee pain and swelling. ED tapped the R knee yesterday due to warmth and pain and aspirate was found to be yellow-cloudy with 40K wbc and MSU crystals. CBC was without leukocytosis, the patient has remained afebrile, blood cultures are without growth, aspirate cultures are without growth, and aspirate wbc counts are <50K so do not suspect septic arthritis. Given elevated CRP, increased WBCs and MSU on the right knee tap do suspect this is acute gouty arthritis. The patient was on the medicine floor for 1 day and was without pain, able to ambulate, and asking to go home.Patient was treated with 2 days of PO prednisone BID during the admission and will be discharged with 5 days of prednisone 20mg  PO daily.   Hypokalemia Potassium of 2.2 on discharge and magnesium of 1.1 in the setting of suspected hypovolemia and diuretic use.After holding torsemide, IVF and repletion K+ was 3.5 and Mg+ was 1.6 day of discharge. Will restart torsemide given large volume of IVF and crackles at lung bases on exam with potassium repletion daily.    CKD 3 Baseline creatinine appears to be 1.8-2.0. Creatinine day of discharge found to be 1.33, decreased from 1.6 on discharge after getting +4.48L of net fluid intake and holding torsemide during admission.   GERD Continued Protonix 40mg  BID.   HFrEF 2/2 nonischemic cardiomyopathy Continued torsemide 20 mg twice daily and continued Coreg 6.25 mg twice daily.   Valvular Afib  Mechanical mitral valve replacement INR 4.3 today. Continue Coreg and Continued warfarin with pharmacy dosing. INR day of discharge was  4.6. Will plan for close follow up in the outpatient setting.   BPH Continued tamsulosin 0.8mg  daily

## 2022-02-25 ENCOUNTER — Telehealth: Payer: Self-pay

## 2022-02-25 LAB — CUP PACEART REMOTE DEVICE CHECK
Battery Remaining Longevity: 82 mo
Battery Remaining Percentage: 60 %
Battery Voltage: 3.01 V
Date Time Interrogation Session: 20230822232041
Implantable Lead Implant Date: 20120905
Implantable Lead Implant Date: 20120905
Implantable Lead Implant Date: 20120905
Implantable Lead Location: 753858
Implantable Lead Location: 753859
Implantable Lead Location: 753860
Implantable Pulse Generator Implant Date: 20190610
Lead Channel Impedance Value: 380 Ohm
Lead Channel Impedance Value: 610 Ohm
Lead Channel Pacing Threshold Amplitude: 1 V
Lead Channel Pacing Threshold Amplitude: 2 V
Lead Channel Pacing Threshold Pulse Width: 0.5 ms
Lead Channel Pacing Threshold Pulse Width: 0.6 ms
Lead Channel Sensing Intrinsic Amplitude: 12 mV
Lead Channel Setting Pacing Amplitude: 0.25 V
Lead Channel Setting Pacing Amplitude: 2 V
Lead Channel Setting Pacing Pulse Width: 0.3 ms
Lead Channel Setting Pacing Pulse Width: 0.5 ms
Lead Channel Setting Sensing Sensitivity: 2 mV
Pulse Gen Model: 3222
Pulse Gen Serial Number: 9022287

## 2022-02-25 NOTE — Patient Outreach (Signed)
  Care Coordination [facility] Note Transition Care Management Unsuccessful Follow-up Telephone Call  Date of discharge and from where:  [facility] 02/23/22-02/24/22  Attempts:  1st Attempt  Reason for unsuccessful TCM follow-up call:  Left voice message  Jodelle Gross, RN, BSN, CCM Care Management Coordinator [facility] Phone: [phone]/Fax: [phone]

## 2022-02-26 ENCOUNTER — Telehealth: Payer: Self-pay | Admitting: *Deleted

## 2022-02-26 ENCOUNTER — Telehealth: Payer: Self-pay

## 2022-02-26 ENCOUNTER — Ambulatory Visit: Payer: 59 | Admitting: Physical Therapy

## 2022-02-26 LAB — BODY FLUID CULTURE W GRAM STAIN
Culture: NO GROWTH
Culture: NO GROWTH
Special Requests: NORMAL

## 2022-02-26 LAB — PROTEIN, BODY FLUID (OTHER): Total Protein, Body Fluid Other: 4.3 g/dL

## 2022-02-26 NOTE — Telephone Encounter (Signed)
Call to patient to do TOC.  Message left that Clinics had called . Message left to return call and to ask for [facility].

## 2022-02-26 NOTE — Telephone Encounter (Incomplete)
Transition Care Management Follow-up Telephone Call Date of discharge and from where: 02/24/2022 [facility]- How have you been since you were released from the hospital? *** Any questions or concerns? {YES/NO TOC TRACKING USE FOR MANAGED MEDICAID REPORTING:[id number]}  Items Reviewed: Did the pt receive and understand the discharge instructions provided? {YES/NO:21197} Medications obtained and verified? {YES/NO:21197} Other? {YES/NO:21197} Any new allergies since your discharge? {YES/NO:21197} Dietary orders reviewed? {YES/NO TITLE CASE:22902} Do you have support at home? {YES/NO:21197}  Home Care and Equipment/Supplies: Were home health services ordered? {Response; yes/no/na:63} If so, what is the name of the agency? ***  Has the agency set up a time to come to the patient's home? {Response; yes/no/na:63} Were any new equipment or medical supplies ordered?  {MVH/QI:[id number]} What is the name of the medical supply agency? *** Were you able to get the supplies/equipment? {Response; yes/no/na:63} Do you have any questions related to the use of the equipment or supplies? {XLK/GM:[id number]}  Functional Questionnaire: (I = Independent and D = Dependent) ADLs: ***  Bathing/Dressing- ***  Meal Prep- ***  Eating- ***  Maintaining continence- ***  Transferring/Ambulation- ***  Managing Meds- ***  Follow up appointments reviewed:  PCP Hospital f/u appt confirmed? {YES/NO:21197} Scheduled to see *** on *** @ ***. Specialist Hospital f/u appt confirmed? {YES/NO:21197} Scheduled to see *** on *** @ ***. Are transportation arrangements needed? {YES/NO:21197} If their condition worsens, is the pt aware to call PCP or go to the Emergency Dept.? {YES/NO TITLE CASE:22902} Was the patient provided with contact information for the PCP's office or ED? {YES/NO TITLE CASE:22902} Was to pt encouraged to call back with questions or concerns? {YES/NO TITLE CASE:22902}

## 2022-02-26 NOTE — Patient Outreach (Signed)
  Care Coordination TOC Note Transition Care Management Unsuccessful Follow-up Telephone Call  Date of discharge and from where:  [facility] 02/23/22-02/24/22  Attempts:  1st Attempt  Reason for unsuccessful TCM follow-up call:  Left voice message

## 2022-02-27 ENCOUNTER — Telehealth: Payer: Self-pay

## 2022-02-27 LAB — CULTURE, BLOOD (ROUTINE X 2)
Culture: NO GROWTH
Culture: NO GROWTH
Special Requests: ADEQUATE
Special Requests: ADEQUATE

## 2022-02-27 NOTE — Patient Outreach (Signed)
  Care Coordination [facility] Note Transition Care Management Unsuccessful Follow-up Telephone Call  Date of discharge and from where:  [facility] 02/23/22-02/24/22  Attempts:  3rd Attempt  Reason for unsuccessful TCM follow-up call:  Left voice message  Jodelle Gross, RN, BSN, CCM Care Management Coordinator [facility] Phone: [phone]/Fax: [phone]

## 2022-02-28 LAB — ANAEROBIC CULTURE W GRAM STAIN
Gram Stain: NONE SEEN
Gram Stain: NONE SEEN
Special Requests: NORMAL
Special Requests: NORMAL

## 2022-03-02 ENCOUNTER — Other Ambulatory Visit: Payer: Self-pay | Admitting: *Deleted

## 2022-03-02 MED ORDER — FERROUS SULFATE 325 (65 FE) MG PO TABS: 325.0000 mg | ORAL_TABLET | Freq: Every day | ORAL | 0 refills | Status: AC

## 2022-03-03 ENCOUNTER — Inpatient Hospital Stay (HOSPITAL_COMMUNITY): Payer: 59

## 2022-03-03 ENCOUNTER — Inpatient Hospital Stay (HOSPITAL_COMMUNITY)
Admission: EM | Admit: 2022-03-03 | Discharge: 2022-04-05 | DRG: 308 | Disposition: E | Payer: 59 | Attending: Pulmonary Disease | Admitting: Pulmonary Disease

## 2022-03-03 ENCOUNTER — Emergency Department (HOSPITAL_COMMUNITY): Payer: 59

## 2022-03-03 ENCOUNTER — Other Ambulatory Visit: Payer: Self-pay

## 2022-03-03 DIAGNOSIS — K219 Gastro-esophageal reflux disease without esophagitis: Secondary | ICD-10-CM | POA: Diagnosis present

## 2022-03-03 DIAGNOSIS — M109 Gout, unspecified: Secondary | ICD-10-CM | POA: Diagnosis present

## 2022-03-03 DIAGNOSIS — G8929 Other chronic pain: Secondary | ICD-10-CM | POA: Diagnosis present

## 2022-03-03 DIAGNOSIS — N400 Enlarged prostate without lower urinary tract symptoms: Secondary | ICD-10-CM | POA: Diagnosis present

## 2022-03-03 DIAGNOSIS — J9601 Acute respiratory failure with hypoxia: Secondary | ICD-10-CM | POA: Diagnosis not present

## 2022-03-03 DIAGNOSIS — G931 Anoxic brain damage, not elsewhere classified: Secondary | ICD-10-CM | POA: Diagnosis present

## 2022-03-03 DIAGNOSIS — I4821 Permanent atrial fibrillation: Secondary | ICD-10-CM | POA: Diagnosis present

## 2022-03-03 DIAGNOSIS — I5022 Chronic systolic (congestive) heart failure: Secondary | ICD-10-CM | POA: Diagnosis present

## 2022-03-03 DIAGNOSIS — G9340 Encephalopathy, unspecified: Secondary | ICD-10-CM | POA: Diagnosis not present

## 2022-03-03 DIAGNOSIS — I130 Hypertensive heart and chronic kidney disease with heart failure and stage 1 through stage 4 chronic kidney disease, or unspecified chronic kidney disease: Secondary | ICD-10-CM | POA: Diagnosis present

## 2022-03-03 DIAGNOSIS — N179 Acute kidney failure, unspecified: Secondary | ICD-10-CM

## 2022-03-03 DIAGNOSIS — I4901 Ventricular fibrillation: Secondary | ICD-10-CM | POA: Diagnosis present

## 2022-03-03 DIAGNOSIS — K7200 Acute and subacute hepatic failure without coma: Secondary | ICD-10-CM | POA: Diagnosis not present

## 2022-03-03 DIAGNOSIS — I428 Other cardiomyopathies: Secondary | ICD-10-CM | POA: Diagnosis present

## 2022-03-03 DIAGNOSIS — Z950 Presence of cardiac pacemaker: Secondary | ICD-10-CM

## 2022-03-03 DIAGNOSIS — I469 Cardiac arrest, cause unspecified: Secondary | ICD-10-CM

## 2022-03-03 DIAGNOSIS — Z515 Encounter for palliative care: Secondary | ICD-10-CM | POA: Diagnosis not present

## 2022-03-03 DIAGNOSIS — Z87891 Personal history of nicotine dependence: Secondary | ICD-10-CM

## 2022-03-03 DIAGNOSIS — R4182 Altered mental status, unspecified: Secondary | ICD-10-CM | POA: Diagnosis not present

## 2022-03-03 DIAGNOSIS — N1832 Chronic kidney disease, stage 3b: Secondary | ICD-10-CM | POA: Diagnosis present

## 2022-03-03 DIAGNOSIS — Z8616 Personal history of COVID-19: Secondary | ICD-10-CM | POA: Diagnosis not present

## 2022-03-03 DIAGNOSIS — E8809 Other disorders of plasma-protein metabolism, not elsewhere classified: Secondary | ICD-10-CM | POA: Diagnosis not present

## 2022-03-03 DIAGNOSIS — Z79899 Other long term (current) drug therapy: Secondary | ICD-10-CM

## 2022-03-03 DIAGNOSIS — Z7982 Long term (current) use of aspirin: Secondary | ICD-10-CM

## 2022-03-03 DIAGNOSIS — Z66 Do not resuscitate: Secondary | ICD-10-CM | POA: Diagnosis not present

## 2022-03-03 DIAGNOSIS — I462 Cardiac arrest due to underlying cardiac condition: Secondary | ICD-10-CM | POA: Diagnosis present

## 2022-03-03 DIAGNOSIS — T502X5A Adverse effect of carbonic-anhydrase inhibitors, benzothiadiazides and other diuretics, initial encounter: Secondary | ICD-10-CM | POA: Diagnosis present

## 2022-03-03 DIAGNOSIS — E876 Hypokalemia: Secondary | ICD-10-CM | POA: Diagnosis present

## 2022-03-03 DIAGNOSIS — J690 Pneumonitis due to inhalation of food and vomit: Secondary | ICD-10-CM | POA: Diagnosis not present

## 2022-03-03 DIAGNOSIS — G40901 Epilepsy, unspecified, not intractable, with status epilepticus: Secondary | ICD-10-CM | POA: Diagnosis not present

## 2022-03-03 DIAGNOSIS — D75839 Thrombocytosis, unspecified: Secondary | ICD-10-CM | POA: Diagnosis not present

## 2022-03-03 DIAGNOSIS — M549 Dorsalgia, unspecified: Secondary | ICD-10-CM | POA: Diagnosis present

## 2022-03-03 DIAGNOSIS — Z7901 Long term (current) use of anticoagulants: Secondary | ICD-10-CM

## 2022-03-03 DIAGNOSIS — R791 Abnormal coagulation profile: Secondary | ICD-10-CM | POA: Diagnosis present

## 2022-03-03 DIAGNOSIS — R569 Unspecified convulsions: Secondary | ICD-10-CM | POA: Diagnosis not present

## 2022-03-03 DIAGNOSIS — Z91199 Patient's noncompliance with other medical treatment and regimen due to unspecified reason: Secondary | ICD-10-CM

## 2022-03-03 DIAGNOSIS — Z952 Presence of prosthetic heart valve: Secondary | ICD-10-CM

## 2022-03-03 LAB — MAGNESIUM
Magnesium: 1.4 mg/dL — ABNORMAL LOW (ref 1.7–2.4)
Magnesium: 1.4 mg/dL — ABNORMAL LOW (ref 1.7–2.4)
Magnesium: 2.1 mg/dL (ref 1.7–2.4)

## 2022-03-03 LAB — CBC WITH DIFFERENTIAL/PLATELET
Abs Immature Granulocytes: 0 10*3/uL (ref 0.00–0.07)
Basophils Absolute: 0 10*3/uL (ref 0.0–0.1)
Basophils Relative: 0 %
Eosinophils Absolute: 0.3 10*3/uL (ref 0.0–0.5)
Eosinophils Relative: 2 %
HCT: 33.1 % — ABNORMAL LOW (ref 39.0–52.0)
Hemoglobin: 10.1 g/dL — ABNORMAL LOW (ref 13.0–17.0)
Lymphocytes Relative: 15 %
Lymphs Abs: 2.6 10*3/uL (ref 0.7–4.0)
MCH: 27.3 pg (ref 26.0–34.0)
MCHC: 30.5 g/dL (ref 30.0–36.0)
MCV: 89.5 fL (ref 80.0–100.0)
Monocytes Absolute: 0.2 10*3/uL (ref 0.1–1.0)
Monocytes Relative: 1 %
Neutro Abs: 13.9 10*3/uL — ABNORMAL HIGH (ref 1.7–7.7)
Neutrophils Relative %: 82 %
Platelets: 546 10*3/uL — ABNORMAL HIGH (ref 150–400)
RBC: 3.7 MIL/uL — ABNORMAL LOW (ref 4.22–5.81)
RDW: 19.2 % — ABNORMAL HIGH (ref 11.5–15.5)
WBC: 17 10*3/uL — ABNORMAL HIGH (ref 4.0–10.5)
nRBC: 0 /100 WBC
nRBC: 0.4 % — ABNORMAL HIGH (ref 0.0–0.2)

## 2022-03-03 LAB — COMPREHENSIVE METABOLIC PANEL
ALT: 44 U/L (ref 0–44)
AST: 71 U/L — ABNORMAL HIGH (ref 15–41)
Albumin: 2.5 g/dL — ABNORMAL LOW (ref 3.5–5.0)
Alkaline Phosphatase: 68 U/L (ref 38–126)
Anion gap: 11 (ref 5–15)
BUN: 13 mg/dL (ref 8–23)
CO2: 22 mmol/L (ref 22–32)
Calcium: 7.6 mg/dL — ABNORMAL LOW (ref 8.9–10.3)
Chloride: 105 mmol/L (ref 98–111)
Creatinine, Ser: 1.57 mg/dL — ABNORMAL HIGH (ref 0.61–1.24)
GFR, Estimated: 45 mL/min — ABNORMAL LOW (ref >60)
Glucose, Bld: 187 mg/dL — ABNORMAL HIGH (ref 70–99)
Potassium: 2.5 mmol/L — CL (ref 3.5–5.1)
Sodium: 138 mmol/L (ref 135–145)
Total Bilirubin: 1.1 mg/dL (ref 0.3–1.2)
Total Protein: 5.5 g/dL — ABNORMAL LOW (ref 6.5–8.1)

## 2022-03-03 LAB — POCT I-STAT 7, (LYTES, BLD GAS, ICA,H+H)
Acid-base deficit: 2 mmol/L (ref 0.0–2.0)
Bicarbonate: 23.5 mmol/L (ref 20.0–28.0)
Calcium, Ion: 1.06 mmol/L — ABNORMAL LOW (ref 1.15–1.40)
HCT: 29 % — ABNORMAL LOW (ref 39.0–52.0)
Hemoglobin: 9.9 g/dL — ABNORMAL LOW (ref 13.0–17.0)
O2 Saturation: 100 %
Patient temperature: 96.9
Potassium: 2.4 mmol/L — CL (ref 3.5–5.1)
Sodium: 140 mmol/L (ref 135–145)
TCO2: 25 mmol/L (ref 22–32)
pCO2 arterial: 40.4 mmHg (ref 32–48)
pH, Arterial: 7.367 (ref 7.35–7.45)
pO2, Arterial: 275 mmHg — ABNORMAL HIGH (ref 83–108)

## 2022-03-03 LAB — BASIC METABOLIC PANEL
Anion gap: 10 (ref 5–15)
Anion gap: 11 (ref 5–15)
BUN: 14 mg/dL (ref 8–23)
BUN: 17 mg/dL (ref 8–23)
CO2: 23 mmol/L (ref 22–32)
CO2: 22 mmol/L (ref 22–32)
Calcium: 7.7 mg/dL — ABNORMAL LOW (ref 8.9–10.3)
Calcium: 8.2 mg/dL — ABNORMAL LOW (ref 8.9–10.3)
Chloride: 106 mmol/L (ref 98–111)
Chloride: 106 mmol/L (ref 98–111)
Creatinine, Ser: 1.5 mg/dL — ABNORMAL HIGH (ref 0.61–1.24)
Creatinine, Ser: 1.74 mg/dL — ABNORMAL HIGH (ref 0.61–1.24)
GFR, Estimated: 47 mL/min — ABNORMAL LOW (ref >60)
GFR, Estimated: 40 mL/min — ABNORMAL LOW (ref >60)
Glucose, Bld: 189 mg/dL — ABNORMAL HIGH (ref 70–99)
Glucose, Bld: 125 mg/dL — ABNORMAL HIGH (ref 70–99)
Potassium: 2.4 mmol/L — CL (ref 3.5–5.1)
Potassium: 4.2 mmol/L (ref 3.5–5.1)
Sodium: 139 mmol/L (ref 135–145)
Sodium: 139 mmol/L (ref 135–145)

## 2022-03-03 LAB — ECHOCARDIOGRAM COMPLETE
AR max vel: 1.71 cm2
AV Area VTI: 1.51 cm2
AV Area mean vel: 1.52 cm2
AV Mean grad: 2 mmHg
AV Peak grad: 3.5 mmHg
Ao pk vel: 0.93 m/s
Area-P 1/2: 2.21 cm2
Calc EF: 33 %
MV VTI: 0.7 cm2
P 1/2 time: 613 msec
S' Lateral: 4.6 cm
Single Plane A2C EF: 40.3 %
Single Plane A4C EF: 26.7 %

## 2022-03-03 LAB — URINALYSIS, ROUTINE W REFLEX MICROSCOPIC
Bilirubin Urine: NEGATIVE
Glucose, UA: NEGATIVE mg/dL
Ketones, ur: NEGATIVE mg/dL
Leukocytes,Ua: NEGATIVE
Nitrite: NEGATIVE
Protein, ur: NEGATIVE mg/dL
Specific Gravity, Urine: 1.004 — ABNORMAL LOW (ref 1.005–1.030)
pH: 7 (ref 5.0–8.0)

## 2022-03-03 LAB — LACTIC ACID, PLASMA
Lactic Acid, Venous: 4.3 mmol/L (ref 0.5–1.9)
Lactic Acid, Venous: 5.9 mmol/L (ref 0.5–1.9)

## 2022-03-03 LAB — GLUCOSE, CAPILLARY
Glucose-Capillary: 102 mg/dL — ABNORMAL HIGH (ref 70–99)
Glucose-Capillary: 117 mg/dL — ABNORMAL HIGH (ref 70–99)
Glucose-Capillary: 183 mg/dL — ABNORMAL HIGH (ref 70–99)

## 2022-03-03 LAB — PROTIME-INR
INR: 3 — ABNORMAL HIGH (ref 0.8–1.2)
Prothrombin Time: 31.1 seconds — ABNORMAL HIGH (ref 11.4–15.2)

## 2022-03-03 LAB — MRSA NEXT GEN BY PCR, NASAL: MRSA by PCR Next Gen: NOT DETECTED

## 2022-03-03 LAB — TROPONIN I (HIGH SENSITIVITY)
Troponin I (High Sensitivity): 113 ng/L (ref <18)
Troponin I (High Sensitivity): 2159 ng/L (ref <18)
Troponin I (High Sensitivity): 2801 ng/L (ref <18)
Troponin I (High Sensitivity): 2452 ng/L (ref <18)

## 2022-03-03 LAB — CBG MONITORING, ED: Glucose-Capillary: 164 mg/dL — ABNORMAL HIGH (ref 70–99)

## 2022-03-03 LAB — PATHOLOGIST SMEAR REVIEW

## 2022-03-03 MED ORDER — PANTOPRAZOLE SODIUM 40 MG PO TBEC: 40.0000 mg | DELAYED_RELEASE_TABLET | Freq: Every day | ORAL | Status: DC

## 2022-03-03 MED ORDER — ACETAMINOPHEN 160 MG/5ML PO SOLN
650.0000 mg | ORAL | Status: DC | PRN
  Administered 2022-03-05 – 2022-03-08 (×3): 650 mg
  Filled 2022-03-03 (×3): qty 20.3

## 2022-03-03 MED ORDER — PANTOPRAZOLE 2 MG/ML SUSPENSION
40.0000 mg | Freq: Every day | ORAL | Status: DC
  Administered 2022-03-03 – 2022-03-08 (×6): 40 mg
  Filled 2022-03-03 (×6): qty 20

## 2022-03-03 MED ORDER — FENTANYL CITRATE PF 50 MCG/ML IJ SOSY
25.0000 ug | PREFILLED_SYRINGE | INTRAMUSCULAR | Status: DC | PRN
  Administered 2022-03-04: 50 ug via INTRAVENOUS
  Filled 2022-03-03: qty 1

## 2022-03-03 MED ORDER — PANTOPRAZOLE 2 MG/ML SUSPENSION
40.0000 mg | Freq: Every day | ORAL | Status: DC
  Filled 2022-03-03: qty 20

## 2022-03-03 MED ORDER — SODIUM CHLORIDE 0.9 % IV SOLN
250.0000 mL | INTRAVENOUS | Status: DC
  Administered 2022-03-03 – 2022-03-08 (×3): 250 mL via INTRAVENOUS

## 2022-03-03 MED ORDER — CHLORHEXIDINE GLUCONATE CLOTH 2 % EX PADS
6.0000 | MEDICATED_PAD | Freq: Every day | CUTANEOUS | Status: DC
Start: 2022-03-03 — End: 2022-03-08
  Administered 2022-03-03 – 2022-03-07 (×5): 6 via TOPICAL

## 2022-03-03 MED ORDER — POTASSIUM CHLORIDE 10 MEQ/100ML IV SOLN
10.0000 meq | INTRAVENOUS | Status: AC
  Administered 2022-03-03 (×3): 10 meq via INTRAVENOUS
  Filled 2022-03-03 (×4): qty 100

## 2022-03-03 MED ORDER — PROPOFOL 1000 MG/100ML IV EMUL
80.0000 ug/kg/min | INTRAVENOUS | Status: DC
  Administered 2022-03-03: 20 ug/kg/min via INTRAVENOUS
  Administered 2022-03-03: 5 ug/kg/min via INTRAVENOUS
  Administered 2022-03-04 (×3): 20 ug/kg/min via INTRAVENOUS
  Administered 2022-03-05: 40 ug/kg/min via INTRAVENOUS
  Administered 2022-03-05 (×2): 20 ug/kg/min via INTRAVENOUS
  Administered 2022-03-06: 40 ug/kg/min via INTRAVENOUS
  Administered 2022-03-06: 60 ug/kg/min via INTRAVENOUS
  Administered 2022-03-06: 80 ug/kg/min via INTRAVENOUS
  Administered 2022-03-06: 40 ug/kg/min via INTRAVENOUS
  Administered 2022-03-07 – 2022-03-08 (×10): 80 ug/kg/min via INTRAVENOUS
  Administered 2022-03-08: 40 ug/kg/min via INTRAVENOUS
  Filled 2022-03-03 (×21): qty 100
  Filled 2022-03-03: qty 200
  Filled 2022-03-03 (×6): qty 100

## 2022-03-03 MED ORDER — HEPARIN SODIUM (PORCINE) 5000 UNIT/ML IJ SOLN
5000.0000 [IU] | Freq: Three times a day (TID) | INTRAMUSCULAR | Status: DC
  Administered 2022-03-03 – 2022-03-04 (×3): 5000 [IU] via SUBCUTANEOUS
  Filled 2022-03-03 (×3): qty 1

## 2022-03-03 MED ORDER — ORAL CARE MOUTH RINSE
15.0000 mL | OROMUCOSAL | Status: DC
  Administered 2022-03-03 – 2022-03-08 (×58): 15 mL via OROMUCOSAL

## 2022-03-03 MED ORDER — DOCUSATE SODIUM 50 MG/5ML PO LIQD
100.0000 mg | Freq: Two times a day (BID) | ORAL | Status: DC
  Administered 2022-03-03 – 2022-03-08 (×10): 100 mg
  Filled 2022-03-03 (×10): qty 10

## 2022-03-03 MED ORDER — ONDANSETRON HCL 4 MG/2ML IJ SOLN: 4.0000 mg | Freq: Four times a day (QID) | INTRAMUSCULAR | Status: DC | PRN

## 2022-03-03 MED ORDER — ACETAMINOPHEN 650 MG RE SUPP: 650.0000 mg | RECTAL | Status: AC

## 2022-03-03 MED ORDER — NOREPINEPHRINE 4 MG/250ML-% IV SOLN
2.0000 ug/min | INTRAVENOUS | Status: DC
  Administered 2022-03-06: 2 ug/min via INTRAVENOUS
  Administered 2022-03-08: 6 ug/min via INTRAVENOUS
  Filled 2022-03-03 (×3): qty 250

## 2022-03-03 MED ORDER — POTASSIUM CHLORIDE 10 MEQ/100ML IV SOLN
10.0000 meq | INTRAVENOUS | Status: AC
  Administered 2022-03-03 (×5): 10 meq via INTRAVENOUS
  Filled 2022-03-03 (×3): qty 100

## 2022-03-03 MED ORDER — ORAL CARE MOUTH RINSE: 15.0000 mL | OROMUCOSAL | Status: DC | PRN

## 2022-03-03 MED ORDER — POTASSIUM CHLORIDE 10 MEQ/100ML IV SOLN
INTRAVENOUS | Status: AC
  Administered 2022-03-03: 10 meq
  Filled 2022-03-03: qty 100

## 2022-03-03 MED ORDER — BUSPIRONE HCL 10 MG PO TABS: 30.0000 mg | ORAL_TABLET | Freq: Three times a day (TID) | ORAL | Status: AC | PRN

## 2022-03-03 MED ORDER — FENTANYL CITRATE PF 50 MCG/ML IJ SOSY: 25.0000 ug | PREFILLED_SYRINGE | INTRAMUSCULAR | Status: DC | PRN

## 2022-03-03 MED ORDER — ACETAMINOPHEN 325 MG PO TABS: 650.0000 mg | ORAL_TABLET | ORAL | Status: AC

## 2022-03-03 MED ORDER — ACETAMINOPHEN 650 MG RE SUPP: 650.0000 mg | RECTAL | Status: DC | PRN

## 2022-03-03 MED ORDER — POLYETHYLENE GLYCOL 3350 17 G PO PACK
17.0000 g | PACK | Freq: Every day | ORAL | Status: DC
  Administered 2022-03-03 – 2022-03-08 (×6): 17 g
  Filled 2022-03-03 (×6): qty 1

## 2022-03-03 MED ORDER — MAGNESIUM SULFATE 4 GM/100ML IV SOLN
4.0000 g | Freq: Once | INTRAVENOUS | Status: AC
  Administered 2022-03-03: 4 g via INTRAVENOUS
  Filled 2022-03-03 (×2): qty 100

## 2022-03-03 MED ORDER — ACETAMINOPHEN 160 MG/5ML PO SOLN
650.0000 mg | ORAL | Status: AC
  Administered 2022-03-03 – 2022-03-04 (×5): 650 mg
  Filled 2022-03-03 (×5): qty 20.3

## 2022-03-03 MED ORDER — ACETAMINOPHEN 325 MG PO TABS
650.0000 mg | ORAL_TABLET | ORAL | Status: DC | PRN
  Administered 2022-03-05: 650 mg via ORAL
  Filled 2022-03-03: qty 2

## 2022-03-03 NOTE — Progress Notes (Signed)
EEG complete - results pending 

## 2022-03-03 NOTE — ED Provider Notes (Signed)
[facility] EMERGENCY DEPARTMENT Provider Note   CSN: [id number] Arrival date & time: 02/22/2022  0753     History  Chief Complaint  Patient presents with   Cardiac Arrest    [name] is a 76 y.o. male.   Cardiac Arrest Patient presents with cardiac arrest reported witnessed or near witnessed arrest.  Reportedly agonal breaths but no pulse when EMS arrived.  Has had 7 shocks for V-fib at 360.  Had 300 amiodarone.  ET in place.  Had fentanyl Versed and an epi drip due to hypotension.  Has been gaining at the 2.  Somewhat difficult intubation by EMS.  Return of vitals prior to arrival.    Past Medical History:  Diagnosis Date   Acute GI bleeding 06/27/2021   Anemia 01/29/2016   Atrial fibrillation ([place])    post op.  s/p dc-cv 04/2008. 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  a- Echo 07/2008 Ef 55% mild peroprosthetic MVR with High transmitral gradient (mean 14).  b- normal coronaries by cath 12/2007   BPH (benign prostatic hyperplasia)    Cardiac resynchronization therapy pacemaker (CRT-P) in place 06/22/2011   CHF (congestive heart failure) (HCC)    EF 35-40 % 2011 due to valvular disease and diastolic dysfunction   Chronic back pain    CKD (chronic kidney disease), stage III ([place])    COVID-19 virus infection 07/24/2020   Tested positive on July 16, 2020   Dysphagia    with normal barium swallow 09/2008   Endocarditis    GERD (gastroesophageal reflux disease)    Heavy alcohol use    history   History of atrial flutter 03/13/2008   History of atrial flutter - resolved.    History of GI bleed    Hypertension    IV drug abuse ([place])    history of   Lower GI bleed 01/2016   Memory impairment 12/24/2015   Pacemaker 2010    Home Medications Prior to Admission medications   Medication Sig Start Date  End Date Taking? Authorizing Provider  acetaminophen (TYLENOL) 500 MG tablet Take 1,000 mg by mouth every 6 (six) hours as needed for moderate pain.    [provider]  atorvastatin (LIPITOR) 20 MG tablet Take 20 mg by mouth every evening. 01/26/22   [provider]  carvedilol (COREG) 6.25 MG tablet Take 1 tablet (6.25 mg total) by mouth 2 (two) times daily with a meal. 12/16/20   Cato Mulligan, MD  ferrous sulfate 325 (65 FE) MG tablet Take 1 tablet (325 mg total) by mouth daily. 03/02/22 05/31/22  Lucious Groves, Hubbard  pantoprazole (PROTONIX) 40 MG tablet TAKE 1 TABLET (40 MG TOTAL) BY MOUTH TWICE A DAY BEFORE MEALS Patient taking differently: Take 40 mg by mouth 2 (two) times daily before a meal. 10/15/21   [name], MD  polyethylene glycol (MIRALAX) 17 g packet Take 17 g by mouth daily as needed. Take as needed, no more than twice daily if experiencing constipation 12/05/21   Timothy Lasso, MD  potassium chloride SA (KLOR-CON M) 20 MEQ tablet Take 1 tablet (20 mEq total) by mouth daily for 30 doses. 02/24/22 03/26/22  Iona Coach, MD  predniSONE (DELTASONE) 20 MG tablet Take 1 tablet (20 mg total) by mouth daily. 02/24/22   Stann Mainland,  Dorothea Ogle, MD  rosuvastatin (CRESTOR) 10 MG tablet Take 1 tablet (10 mg total) by mouth daily. 12/30/21 12/30/22  Idamae Schuller, MD  tamsulosin (FLOMAX) 0.4 MG CAPS capsule Take 2 capsules (0.8 mg total) by mouth daily after breakfast. 08/29/21   Demaio, Alexa, MD  torsemide (DEMADEX) 20 MG tablet Take 1 tablet (20 mg total) by mouth 2 (two) times daily. 12/06/21 03/06/22  Timothy Lasso, MD  warfarin (COUMADIN) 5 MG tablet Take 0.5 tablets (2.5 mg total) by mouth daily at 8 pm. Take one-half (1/2) of your 5mg  peach-colored warfarin tablet, Monday, Tuesday, Wednesday, Thursday and Friday. On Saturday and Sunday, take one (1) tablet. 02/25/22   Iona Coach, MD      Allergies    Patient has no known allergies.    Review of Systems   Review of  Systems  Physical Exam Updated Vital Signs BP 92/68   Pulse (!) 59   Temp (!) 97.3 F (36.3 C)   Resp (!) 24   SpO2 100%  Physical Exam Vitals reviewed.  HENT:     Head: Atraumatic.     Mouth/Throat:     Comments: There is some blood in the ET tube. Eyes:     Comments: Pupils constricted and not reactive.  Neck:     Comments: JVD Pulmonary:     Comments: Mildly harsh breath sounds.  Equal bilaterally.  Pacemaker left chest wall. Abdominal:     General: There is distension.  Musculoskeletal:     Comments:  right proximal tibia IO line.  Skin:    General: Skin is warm.  Neurological:     Comments: Breathing spontaneously.  Reportedly has been some gagging on the tube.  Unresponsive at this time.     ED Results / Procedures / Treatments   Labs (all labs ordered are listed, but only abnormal results are displayed) Labs Reviewed  PROTIME-INR - Abnormal; Notable for the following components:      Result Value   Prothrombin Time 31.1 (*)    INR 3.0 (*)    All other components within normal limits  CBC WITH DIFFERENTIAL/PLATELET - Abnormal; Notable for the following components:   WBC 17.0 (*)    RBC 3.70 (*)    Hemoglobin 10.1 (*)    HCT 33.1 (*)    RDW 19.2 (*)    Platelets 546 (*)    nRBC 0.4 (*)    Neutro Abs 13.9 (*)    All other components within normal limits  COMPREHENSIVE METABOLIC PANEL - Abnormal; Notable for the following components:   Potassium 2.5 (*)    Glucose, Bld 187 (*)    Creatinine, Ser 1.57 (*)    Calcium 7.6 (*)    Total Protein 5.5 (*)    Albumin 2.5 (*)    AST 71 (*)    GFR, Estimated 45 (*)    All other components within normal limits  LACTIC ACID, PLASMA - Abnormal; Notable for the following components:   Lactic Acid, Venous 4.3 (*)    All other components within normal limits  LACTIC ACID, PLASMA - Abnormal; Notable for the following components:   Lactic Acid, Venous 5.9 (*)    All other components within normal limits  MAGNESIUM  - Abnormal; Notable for the following components:   Magnesium 1.4 (*)    All other components within normal limits  URINALYSIS, ROUTINE W REFLEX MICROSCOPIC - Abnormal; Notable for the following components:   Specific Gravity, Urine 1.004 (*)    Hgb  urine dipstick MODERATE (*)    Bacteria, UA RARE (*)    All other components within normal limits  BASIC METABOLIC PANEL - Abnormal; Notable for the following components:   Potassium 2.4 (*)    Glucose, Bld 189 (*)    Creatinine, Ser 1.50 (*)    Calcium 7.7 (*)    GFR, Estimated 47 (*)    All other components within normal limits  MAGNESIUM - Abnormal; Notable for the following components:   Magnesium 1.4 (*)    All other components within normal limits  GLUCOSE, CAPILLARY - Abnormal; Notable for the following components:   Glucose-Capillary 183 (*)    All other components within normal limits  CBG MONITORING, ED - Abnormal; Notable for the following components:   Glucose-Capillary 164 (*)    All other components within normal limits  TROPONIN I (HIGH SENSITIVITY) - Abnormal; Notable for the following components:   Troponin I (High Sensitivity) 113 (*)    All other components within normal limits  TROPONIN I (HIGH SENSITIVITY) - Abnormal; Notable for the following components:   Troponin I (High Sensitivity) 2,159 (*)    All other components within normal limits  CULTURE, BLOOD (ROUTINE X 2)  CULTURE, BLOOD (ROUTINE X 2)  PATHOLOGIST SMEAR REVIEW  BLOOD GAS, ARTERIAL  BLOOD GAS, ARTERIAL  BASIC METABOLIC PANEL  MAGNESIUM  TROPONIN I (HIGH SENSITIVITY)  TROPONIN I (HIGH SENSITIVITY)    EKG EKG Interpretation  Date/Time:  Tuesday March 03 2022 08:00:11 EDT Ventricular Rate:  66 PR Interval:    QRS Duration: 100 QT Interval:  471 QTC Calculation: 494 R Axis:   89 Text Interpretation: Atrial fibrillation Ventricular premature complex Borderline right axis deviation Low voltage, extremity leads Nonspecific repol abnormality,  diffuse leads Borderline prolonged QT interval Confirmed by Davonna Belling [phone]) on 02/19/2022 8:18:35 AM  Radiology ECHOCARDIOGRAM COMPLETE  Result Date: 02/10/2022    ECHOCARDIOGRAM REPORT   Patient Name:   [name] Date of Exam: 02/25/2022 Medical Rec #:  [id number]       Height:       68.0 in Accession #:    [id number]      Weight:       184.5 lb Date of Birth:  01-05-1946       BSA:          1.975 m Patient Age:    76 years        BP:           102/71 mmHg Patient Gender: M               HR:           63 bpm. Exam Location:  Inpatient Procedure: 2D Echo, Cardiac Doppler and Color Doppler Indications:    Cardiac arrest  History:        Patient has prior history of Echocardiogram examinations, most                 recent 10/24/2021. CHF, TIA; Arrythmias:Atrial Flutter.  Sonographer:    Wenda Low Referring Phys: [id number] RAVI AGARWALA  Sonographer Comments: Echo performed with patient supine and on artificial respirator. IMPRESSIONS  1. Left ventricular ejection fraction, by estimation, is 25 to 30%. The left ventricle has severely decreased function. The left ventricle demonstrates regional wall motion abnormalities (inferior and inferolateral WMAs). The left ventricular internal cavity size was moderately dilated (despite PLAX LVIDD). There is moderate concentric left ventricular hypertrophy. Left ventricular diastolic parameters are indeterminate.  2. Right ventricular systolic function is moderately reduced. The right ventricular size is moderately enlarged. There is moderately elevated pulmonary artery systolic pressure. The estimated right ventricular systolic pressure is 47.7 mmHg.  3. Left atrial size was severely dilated.  4. Right atrial size was severely dilated.  5. Prosthetic valve: PHT 83 ms, TVI 2.71. The mitral valve has been repaired/replaced. Mild mitral valve regurgitation. The mean mitral valve gradient is 3.0 mmHg.  6. Tricuspid valve regurgitation is mild to moderate.  7.  The aortic valve was not well visualized. Aortic valve regurgitation is trivial. No aortic stenosis is present. Comparison(s): LVEF appears worse from prior. FINDINGS  Left Ventricle: Left ventricular ejection fraction, by estimation, is 25 to 30%. The left ventricle has severely decreased function. The left ventricle demonstrates regional wall motion abnormalities. The left ventricular internal cavity size was moderately dilated. There is moderate concentric left ventricular hypertrophy. Left ventricular diastolic parameters are indeterminate.  LV Wall Scoring: The mid inferolateral segment is akinetic. The basal inferolateral segment and basal inferior segment are hypokinetic. Right Ventricle: The right ventricular size is moderately enlarged. No increase in right ventricular wall thickness. Right ventricular systolic function is moderately reduced. There is moderately elevated pulmonary artery systolic pressure. The tricuspid  regurgitant velocity is 2.86 m/s, and with an assumed right atrial pressure of 15 mmHg, the estimated right ventricular systolic pressure is 47.7 mmHg. Left Atrium: Left atrial size was severely dilated. Right Atrium: Right atrial size was severely dilated. Pericardium: There is no evidence of pericardial effusion. Mitral Valve: Prosthetic valve: PHT 83 ms, TVI 2.71. The mitral valve has been repaired/replaced. Mild mitral valve regurgitation. MV peak gradient, 10.8 mmHg. The mean mitral valve gradient is 3.0 mmHg. Tricuspid Valve: The tricuspid valve is normal in structure. Tricuspid valve regurgitation is mild to moderate. No evidence of tricuspid stenosis. Aortic Valve: The aortic valve was not well visualized. Aortic valve regurgitation is trivial. Aortic regurgitation PHT measures 613 msec. No aortic stenosis is present. Aortic valve mean gradient measures 2.0 mmHg. Aortic valve peak gradient measures 3.5 mmHg. Aortic valve area, by VTI measures 1.51 cm. Pulmonic Valve: The pulmonic  valve was normal in structure. Pulmonic valve regurgitation is not visualized. No evidence of pulmonic stenosis. Aorta: The aortic root and ascending aorta are structurally normal, with no evidence of dilitation. IAS/Shunts: No atrial level shunt detected by color flow Doppler.  LEFT VENTRICLE PLAX 2D LVIDd:         5.15 cm LVIDs:         4.60 cm LV PW:         1.25 cm LV IVS:        1.25 cm LVOT diam:     1.80 cm LV SV:         31 LV SV Index:   16 LVOT Area:     2.54 cm  LV Volumes (MOD) LV vol d, MOD A2C: 114.0 ml LV vol d, MOD A4C: 106.0 ml LV vol s, MOD A2C: 68.1 ml LV vol s, MOD A4C: 77.7 ml LV SV MOD A2C:     45.9 ml LV SV MOD A4C:     106.0 ml LV SV MOD BP:      36.3 ml RIGHT VENTRICLE RV S prime:     6.53 cm/s LEFT ATRIUM              Index        RIGHT ATRIUM  Index LA diam:        5.90 cm  2.99 cm/m   RA Area:     32.00 cm LA Vol (A2C):   166.0 ml 84.05 ml/m  RA Volume:   115.00 ml 58.23 ml/m LA Vol (A4C):   170.0 ml 86.08 ml/m LA Biplane Vol: 172.0 ml 87.09 ml/m  AORTIC VALVE                    PULMONIC VALVE AV Area (Vmax):    1.71 cm     PV Vmax:       0.43 m/s AV Area (Vmean):   1.52 cm     PV Peak grad:  0.7 mmHg AV Area (VTI):     1.51 cm AV Vmax:           93.05 cm/s AV Vmean:          61.150 cm/s AV VTI:            0.206 m AV Peak Grad:      3.5 mmHg AV Mean Grad:      2.0 mmHg LVOT Vmax:         62.50 cm/s LVOT Vmean:        36.600 cm/s LVOT VTI:          0.122 m LVOT/AV VTI ratio: 0.59 AI PHT:            613 msec  AORTA Ao Root diam: 3.90 cm Ao Asc diam:  3.20 cm MITRAL VALVE                TRICUSPID VALVE MV Area (PHT): 2.21 cm     TR Peak grad:   32.7 mmHg MV Area VTI:   0.70 cm     TR Vmax:        286.00 cm/s MV Peak grad:  10.8 mmHg MV Mean grad:  3.0 mmHg     SHUNTS MV Vmax:       1.64 m/s     Systemic VTI:  0.12 m MV Vmean:      69.5 cm/s    Systemic Diam: 1.80 cm MV Decel Time: 344 msec MV E velocity: 132.50 cm/s Riley Lam MD Electronically signed by Riley Lam MD Signature Date/Time: 02-Apr-2022/1:42:44 PM    Final    DG Abd Portable 1V  Result Date: April 02, 2022 CLINICAL DATA:  Enteric tube placement EXAM: PORTABLE ABDOMEN - 1 VIEW COMPARISON:  Previous studies including the examination done earlier today FINDINGS: There is interval repositioning of enteric tube with its tip in the region of body of stomach. There are multiple gallbladder stones. Liver appears to be enlarged in size. Bowel gas pattern is nonspecific. Biventricular pacer leads are noted in the heart. There is prosthetic cardiac valve. IMPRESSION: Tip of enteric tube is seen in the region of body of stomach. Nonspecific bowel gas pattern. Gallbladder stones. Electronically Signed   By: Ernie Avena M.D.   On: April 02, 2022 13:32   EEG adult  Result Date: 04/02/22 Charlsie Quest, MD     04-02-22 11:35 AM Patient Name: [name] MRN: [id number] Epilepsy Attending: Charlsie Quest Referring Physician/Provider: Lynnell Catalan, MD Date: 04/02/2022 Duration: 22.41 mins Patient history: 76yo M s/p cardiac arrest. EEG to evaluate for seizure Level of alertness:  comatose AEDs during EEG study: None Technical aspects: This EEG study was done with scalp electrodes positioned according to the 10-20 International system of electrode placement. Electrical activity was reviewed  with band pass filter of 1-70Hz , sensitivity of 7 uV/mm, display speed of 53mm/sec with a 60Hz  notched filter applied as appropriate. EEG data were recorded continuously and digitally stored.  Video monitoring was available and reviewed as appropriate. Description: EEG showed continuous generalized background attenuation which was not reactive to tactile stimulation.  Hyperventilation and photic stimulation were not performed.   ABNORMALITY -Background attenuation, generalized IMPRESSION: This study is suggestive of profound diffuse encephalopathy, nonspecific etiology.  No seizures or epileptiform discharges were  seen throughout the recording. Lora Havens   DG Abd 1 View  Result Date: 02/27/2022 CLINICAL DATA:  The OG tube insertion EXAM: ABDOMEN - 1 VIEW COMPARISON:  Chest radiograph 02/05/2022 FINDINGS: Enteric tube extends just below the left hemidiaphragm, likely terminating at the level of the GE junction. Multiple calcifications in the right abdomen are consistent with gallstones. Prosthetic mitral valve and distal TIPS of cardiac assist device leads are seen. Heart appears mildly enlarged. IMPRESSION: OG tube terminates just below the left hemidiaphragm, likely at the level of the GE junction. Electronically Signed   By: Miachel Roux M.D.   On: 02/12/2022 09:20   CT HEAD WO CONTRAST (5MM)  Result Date: 03/05/2022 CLINICAL DATA:  Mental status change, unknown cause. Status post cardiac arrest EXAM: CT HEAD WITHOUT CONTRAST TECHNIQUE: Contiguous axial images were obtained from the base of the skull through the vertex without intravenous contrast. RADIATION DOSE REDUCTION: This exam was performed according to the departmental dose-optimization program which includes automated exposure control, adjustment of the mA and/or kV according to patient size and/or use of iterative reconstruction technique. COMPARISON:  12/01/2021 FINDINGS: Brain: There is atrophy and chronic small vessel disease changes. No acute intracranial abnormality. Specifically, no hemorrhage, hydrocephalus, mass lesion, acute infarction, or significant intracranial injury. Vascular: No hyperdense vessel or unexpected calcification. Skull: No acute calvarial abnormality. Sinuses/Orbits: No acute findings. Mucosal thickening throughout the paranasal sinuses. Other: None IMPRESSION: Atrophy, chronic microvascular disease. No acute intracranial abnormality. Electronically Signed   By: Rolm Baptise M.D.   On: 02/16/2022 08:40   DG Chest Portable 1 View  Result Date: 02/28/2022 CLINICAL DATA:  Ventilator dependence. EXAM: PORTABLE CHEST 1 VIEW  COMPARISON:  02/22/2022 FINDINGS: 0819 hours. Endotracheal tube tip is 3.5 cm above the base of the carina. The NG tube passes just into the stomach with proximal side port in the lower esophagus. Left permanent pacemaker again noted. Low volume film with patchy airspace disease in the left mid lung and right base. Pulmonary vascular congestion evident. The cardio pericardial silhouette is enlarged. No substantial pleural effusion. Telemetry leads overlie the chest. IMPRESSION: 1. Patchy airspace disease left mid lung and right base. Asymmetric edema versus multifocal pneumonia. 2. NG tube tip is just below the GE junction with proximal side port in the distal esophagus. 3. Endotracheal tube tip 3.5 cm above the base of the carina. Electronically Signed   By: Misty Stanley M.D.   On: 03/05/2022 08:29    Procedures Procedures    Medications Ordered in ED Medications  magnesium sulfate IVPB 4 g 100 mL (4 g Intravenous New Bag/Given 02/27/2022 1429)  propofol (DIPRIVAN) 1000 MG/100ML infusion (30 mcg/kg/min  83.7 kg Intravenous Rate/Dose Change 02/25/2022 1203)  docusate (COLACE) 50 MG/5ML liquid 100 mg (100 mg Per Tube Given 03/02/2022 1415)  polyethylene glycol (MIRALAX / GLYCOLAX) packet 17 g (17 g Per Tube Given 02/28/2022 1415)  ondansetron (ZOFRAN) injection 4 mg (has no administration in time range)  acetaminophen (TYLENOL) tablet 650  mg ( Oral See Alternative 02/16/2022 1414)    Or  acetaminophen (TYLENOL) 160 MG/5ML solution 650 mg (650 mg Per Tube Given 02/07/2022 1414)    Or  acetaminophen (TYLENOL) suppository 650 mg ( Rectal See Alternative 02/15/2022 1414)  acetaminophen (TYLENOL) tablet 650 mg (has no administration in time range)    Or  acetaminophen (TYLENOL) 160 MG/5ML solution 650 mg (has no administration in time range)    Or  acetaminophen (TYLENOL) suppository 650 mg (has no administration in time range)  busPIRone (BUSPAR) tablet 30 mg (has no administration in time range)    Or  busPIRone  (BUSPAR) tablet 30 mg (has no administration in time range)  norepinephrine (LEVOPHED) 4mg  in 238mL (0.016 mg/mL) premix infusion ( Intravenous Not Given 03/01/2022 1016)  0.9 %  sodium chloride infusion (250 mLs Intravenous New Bag/Given 02/15/2022 1034)  heparin injection 5,000 Units (5,000 Units Subcutaneous Given 02/20/2022 1349)  fentaNYL (SUBLIMAZE) injection 25 mcg (has no administration in time range)  fentaNYL (SUBLIMAZE) injection 25-100 mcg (has no administration in time range)  potassium chloride 10 mEq in 100 mL IVPB (10 mEq Intravenous New Bag/Given 02/17/2022 1414)  pantoprazole (PROTONIX) 2 mg/mL oral suspension 40 mg (40 mg Per Tube Given 02/04/2022 1415)  potassium chloride 10 mEq in 100 mL IVPB (has no administration in time range)    ED Course/ Medical Decision Making/ A&P                           Medical Decision Making Amount and/or Complexity of Data Reviewed Labs: ordered. Radiology: ordered.  Risk Decision regarding hospitalization.   Patient brought in postcardiac arrest.  Differential diagnosis is long and patient was found to be in ventricular fibrillation.  Shocks x7 and amiodarone given by EMS.  Return of circulation.  Had Versed and EMS because he was gagging on the tube.  Upon arrival mildly hypotension.  Fluid given.  Tube position verified with x-ray.  EKG here showed A-fib with normal ventricular rate.  Pacemaker interrogated but results had not been available.  Discussed with cardiology who will see patient.  Discussed with ICU who will admit patient.  Reviewed rhythm strips from the V-fib.  Recent admission to the hospital and had some V. tach. Reviewed the note from recent admission.  CRITICAL CARE Performed by: Davonna Belling Total critical care time: 30 minutes Critical care time was exclusive of separately billable procedures and treating other patients. Critical care was necessary to treat or prevent imminent or life-threatening deterioration. Critical  care was time spent personally by me on the following activities: development of treatment plan with patient and/or surrogate as well as nursing, discussions with consultants, evaluation of patient's response to treatment, examination of patient, obtaining history from patient or surrogate, ordering and performing treatments and interventions, ordering and review of laboratory studies, ordering and review of radiographic studies, pulse oximetry and re-evaluation of patient's condition.       D wh/eocume evaluation and cr patitical care time when appropriate:1}      Final Clinical Impression(s) / ED Diagnoses Final diagnoses:  Cardiac arrest [facility])  Ventricular fibrillation [facility])    Rx / DC Orders ED Discharge Orders     None         Davonna Belling, MD 02/10/2022 1507

## 2022-03-03 NOTE — ED Notes (Signed)
RN and MD attempted OG. Unsuccessful

## 2022-03-03 NOTE — Progress Notes (Signed)
ABG results didn't cross over from I-stat. Results below.  7.37/40.4/275/23.5

## 2022-03-03 NOTE — ED Triage Notes (Signed)
Pt BIB EMS due to near witnessed arrest. When EMS arrived pt was agonal breathing and had no pulse. Pt was in vfib and was shocked 7 times at 360. Pt was in vtach at some point. Pt started on amnio. Pt has et tube in place.   EMS MEDS 100 Fentanyl   Epi at but was stopped. 800 of NS

## 2022-03-03 NOTE — Progress Notes (Signed)
eLink Physician-Brief Progress Note Patient Name: DENILSON SALMINEN DOB: 1946-05-09 MRN: [id number]   Date of Service  02/13/2022  HPI/Events of Note  Rate-controlled Afib with increased ectopy K and Mag adequately repleted HDS with SBP 120s  eICU Interventions  Tele Goal K>4, Mg >2 Cardiology and EP following Discussed plan with bedside EP     Intervention Category Intermediate Interventions: Arrhythmia - evaluation and management  Sharne Linders Mechele Collin 03/01/2022, 11:41 PM

## 2022-03-03 NOTE — Consult Note (Signed)
Advanced Heart Failure Team Consult Note   Primary Physician: Lucious Groves, DO PCP-Cardiologist:  None  Reason for Consultation: VF arrest   HPI:    [name] is seen today for evaluation of VF arrest at the request of Dr. Lynetta Mare with PCCM.  76 year old male with h/o polysubstance abuse c/b MV endocarditis s/p St. Jude MVR, CHF due to NICM, CKD IIIb and PAF.  Underwent CRT-P implant for bradycardia and fatigue. (Had gen change in 6/19.)    Echo 1/21 EF 35-40% RV ok. mechanical MVR ok   Echo 12/10/17 EF 35-40% with mechanical MVR  Patient admitted 03/23 with a/c CHF and diuresed with IV lasix. Subsequently readmitted a few days later with orthostatic hypotension, AKI and possible TIA.  Readmit for a/c CHF April, May and June for a/c CHF. There has been some concern for medication adherence at home.   Recently admitted earlier this month with acute gout flare and was also treated for hypokalemia associated with NSVT.  Patient was found on the floor by his daughter this am after she heard him let out a "howling" noise. He was not responsive. She called EMS, whom she reports arrived on the scene within 7 minutes. He received CPR and was shocked 7 times for Vfib. Received 300 mg amiodarone IV and started on epi gtt d/t hypotension. He was intubated in the field and transported to [facility].  Labs significant for K 2.5, Cr 1.57 (baseline), mag 1.4, lactic acid 4.3, HS troponin 113. EECG today suggestive of profound diffuse encephalopathy.  Review of Systems:  Unable to assess. Unable to follow commands or answer questions.    Home Medications Prior to Admission medications   Medication Sig Start Date End Date Taking? Authorizing Provider  acetaminophen (TYLENOL) 500 MG tablet Take 1,000 mg by mouth every 6 (six) hours as needed for moderate pain.    [provider]  atorvastatin (LIPITOR) 20 MG tablet Take 20 mg by mouth every evening. 01/26/22   [provider]   carvedilol (COREG) 6.25 MG tablet Take 1 tablet (6.25 mg total) by mouth 2 (two) times daily with a meal. 12/16/20   Cato Mulligan, MD  ferrous sulfate 325 (65 FE) MG tablet Take 1 tablet (325 mg total) by mouth daily. 03/02/22 05/31/22  Lucious Groves, DO  pantoprazole (PROTONIX) 40 MG tablet TAKE 1 TABLET (40 MG TOTAL) BY MOUTH TWICE A DAY BEFORE MEALS Patient taking differently: Take 40 mg by mouth 2 (two) times daily before a meal. 10/15/21   [name], MD  polyethylene glycol (MIRALAX) 17 g packet Take 17 g by mouth daily as needed. Take as needed, no more than twice daily if experiencing constipation 12/05/21   Timothy Lasso, MD  potassium chloride SA (KLOR-CON M) 20 MEQ tablet Take 1 tablet (20 mEq total) by mouth daily for 30 doses. 02/24/22 03/26/22  Iona Coach, MD  predniSONE (DELTASONE) 20 MG tablet Take 1 tablet (20 mg total) by mouth daily. 02/24/22   Iona Coach, MD  rosuvastatin (CRESTOR) 10 MG tablet Take 1 tablet (10 mg total) by mouth daily. 12/30/21 12/30/22  Idamae Schuller, MD  tamsulosin (FLOMAX) 0.4 MG CAPS capsule Take 2 capsules (0.8 mg total) by mouth daily after breakfast. 08/29/21   Demaio, Alexa, MD  torsemide (DEMADEX) 20 MG tablet Take 1 tablet (20 mg total) by mouth 2 (two) times daily. 12/06/21 03/06/22  Timothy Lasso, MD  warfarin (COUMADIN) 5 MG tablet Take 0.5 tablets (2.5 mg total)  by mouth daily at 8 pm. Take one-half (1/2) of your 5mg  peach-colored warfarin tablet, Monday, Tuesday, Wednesday, Thursday and Friday. On Saturday and Sunday, take one (1) tablet. 02/25/22   Iona Coach, MD    Past Medical History: Past Medical History:  Diagnosis Date   Acute GI bleeding 06/27/2021   Anemia 01/29/2016   Atrial fibrillation ([place])    post op.  s/p dc-cv 04/2008. 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  a- Echo  07/2008 Ef 55% mild peroprosthetic MVR with High transmitral gradient (mean 14).  b- normal coronaries by cath 12/2007   BPH (benign prostatic hyperplasia)    Cardiac resynchronization therapy pacemaker (CRT-P) in place 06/22/2011   CHF (congestive heart failure) (HCC)    EF 35-40 % 2011 due to valvular disease and diastolic dysfunction   Chronic back pain    CKD (chronic kidney disease), stage III ([place])    COVID-19 virus infection 07/24/2020   Tested positive on July 16, 2020   Dysphagia    with normal barium swallow 09/2008   Endocarditis    GERD (gastroesophageal reflux disease)    Heavy alcohol use    history   History of atrial flutter 03/13/2008   History of atrial flutter - resolved.    History of GI bleed    Hypertension    IV drug abuse ([place])    history of   Lower GI bleed 01/2016   Memory impairment 12/24/2015   Pacemaker 2010    Past Surgical History: Past Surgical History:  Procedure Laterality Date   BIOPSY  06/29/2021   Procedure: BIOPSY;  Surgeon: Rush Landmark Telford Nab., MD;  Location: [facility] ENDOSCOPY;  Service: Gastroenterology;;   [place] N/A 12/13/2017   Procedure: BIV PACEMAKER GENERATOR CHANGEOUT;  Surgeon: [name], MD;  Location: [place] CV LAB;  Service: Cardiovascular;  Laterality: N/A;   CARDIAC VALVE REPLACEMENT     mitral valve, st jude mechanical    COLONOSCOPY N/A 03/26/2014   Procedure: COLONOSCOPY;  Surgeon: Milus Banister, MD;  Location: [place];  Service: Endoscopy;  Laterality: N/A;   ENTEROSCOPY N/A 03/28/2014   Procedure: ENTEROSCOPY;  Surgeon: Milus Banister, MD;  Location: [place];  Service: Endoscopy;  Laterality: N/A;   ENTEROSCOPY N/A 06/29/2021   Procedure: ENTEROSCOPY;  Surgeon: Rush Landmark Telford Nab., MD;  Location: [place];  Service: Gastroenterology;  Laterality: N/A;   ESOPHAGOGASTRODUODENOSCOPY N/A 03/25/2014   Procedure: ESOPHAGOGASTRODUODENOSCOPY (EGD);  Surgeon: Gatha Mayer,  MD;  Location: [facility] ENDOSCOPY;  Service: Endoscopy;  Laterality: N/A;   GIVENS CAPSULE STUDY N/A 03/26/2014   Procedure: GIVENS CAPSULE STUDY;  Surgeon: Milus Banister, MD;  Location: [place];  Service: Endoscopy;  Laterality: N/A;   HOT HEMOSTASIS N/A 06/29/2021   Procedure: HOT HEMOSTASIS (ARGON PLASMA COAGULATION/BICAP);  Surgeon: Irving Copas., MD;  Location: [place];  Service: Gastroenterology;  Laterality: N/A;   PACEMAKER INSERTION     inserted about 4-5 years ago   [place]  06/29/2021   Procedure: SUBMUCOSAL TATTOO INJECTION;  Surgeon: Irving Copas., MD;  Location[facility] ENDOSCOPY;  Service: Gastroenterology;;    Family History: Family History  Problem Relation Age of Onset   Coronary artery disease Mother    Cancer Father        GI  Cancer Brother        Lung    Social History: Social History   Socioeconomic History   Marital status: Divorced    Spouse name: Not on file   Number of children: 9   Years of education: Not on file   Highest education level: Not on file  Occupational History   Occupation: retired    Comment: [employer]  Tobacco Use   Smoking status: Former    Years: 0.00    Types: Cigarettes   Smokeless tobacco: Never  Vaping Use   Vaping Use: Never used  Substance and Sexual Activity   Alcohol use: No    Alcohol/week: 0.0 standard drinks of alcohol   Drug use: No   Sexual activity: Not Currently  Other Topics Concern   Not on file  Social History Narrative   Current Social History 02/16/2019        Patient lives with family (baby's mama, daughter and 4 grandkids:  Two yo twins, 55 yo and 39 yo in a home which is 1 story. There are 4 steps with handrails up to the entrance the patient uses.       Patient's method of transportation is via family member (baby's mama).      The highest level of education was high school diploma.      The patient currently retired.      Identified important  Relationships are "My Baby's [name], [name]."       Pets : None       Interests / Fun: Sitting outdoors watching the birds and planes."       Exercise riding a bike 5 miles 3 days/week      Current Stressors: The grandkids       Religious / Personal Beliefs: "I believe in God."       [name], RN, BSN       Social Determinants of Health   Financial Resource Strain: Low Risk  (10/03/2021)   Overall Financial Resource Strain (CARDIA)    Difficulty of Paying Living Expenses: Not very hard  Food Insecurity: No Food Insecurity (11/28/2021)   Hunger Vital Sign    Worried About Running Out of Food in the Last Year: Never true    Ran Out of Food in the Last Year: Never true  Transportation Needs: No Transportation Needs (11/28/2021)   PRAPARE - Administrator, Civil Service (Medical): No    Lack of Transportation (Non-Medical): No  Physical Activity: Sufficiently Active (10/03/2021)   Exercise Vital Sign    Days of Exercise per Week: 3 days    Minutes of Exercise per Session: 50 min  Stress: Not on file  Social Connections: Unknown (10/03/2021)   Social Connection and Isolation Panel [NHANES]    Frequency of Communication with Friends and Family: More than three times a week    Frequency of Social Gatherings with Friends and Family: More than three times a week    Attends Religious Services: Not on file    Active Member of Clubs or Organizations: No    Attends Banker Meetings: Never    Marital Status: Not on file    Allergies:  No Known Allergies  Objective:    Vital Signs:   Temp:  [95.9 F (35.5 C)-96.9 F (36.1 C)] 95.9 F (35.5 C) (08/29 1104) Pulse Rate:  [27-55] 53 (08/29 1104) Resp:  [17-25] 25 (08/29 1104) BP: (96-119)/(66-82) 119/82 (08/29 1000) SpO2:  [99 %-100 %]  100 % (08/29 1104) FiO2 (%):  [40 %-100 %] 40 % (08/29 1104)    Weight change: There were no vitals filed for this visit.  Intake/Output:  No intake or output data in  the 24 hours ending 02/28/2022 1156    Physical Exam    General:  Criticall ill appearing on vent HEENT: + ETT Neck: supple. JVP not elevate. Carotids 2+ bilat; no bruits.  Cor: PMI nondisplaced. Regular rate & rhythm. No rubs, gallops or murmurs. Lungs: clear Abdomen: soft, nontender, nondistended. No hepatosplenomegaly. No bruits or masses. Good bowel sounds. Extremities: no cyanosis, clubbing, rash, edema Neuro: alert & orientedx3, cranial nerves grossly intact. moves all 4 extremities w/o difficulty. Affect pleasant   Telemetry   AF 50s-60s, occasional PVCS  EKG    Afib 66 bpm, PVC  Labs   Basic Metabolic Panel: Recent Labs  Lab 03/05/2022 0805  NA 138  K 2.5*  CL 105  CO2 22  GLUCOSE 187*  BUN 13  CREATININE 1.57*  CALCIUM 7.6*  MG 1.4*    Liver Function Tests: Recent Labs  Lab 02/08/2022 0805  AST 71*  ALT 44  ALKPHOS 68  BILITOT 1.1  PROT 5.5*  ALBUMIN 2.5*   No results for input(s): "LIPASE", "AMYLASE" in the last 168 hours. No results for input(s): "AMMONIA" in the last 168 hours.  CBC: Recent Labs  Lab 02/04/2022 0805  WBC 17.0*  NEUTROABS 13.9*  HGB 10.1*  HCT 33.1*  MCV 89.5  PLT 546*    Cardiac Enzymes: No results for input(s): "CKTOTAL", "CKMB", "CKMBINDEX", "TROPONINI" in the last 168 hours.  BNP: BNP (last 3 results) Recent Labs    12/19/21 1129 12/25/21 1401 02/22/22 1814  BNP 238.8* 366.8* 416.4*    ProBNP (last 3 results) No results for input(s): "PROBNP" in the last 8760 hours.   CBG: Recent Labs  Lab 02/21/2022 0806  GLUCAP 164*    Coagulation Studies: Recent Labs    02/12/2022 0805  LABPROT 31.1*  INR 3.0*     Imaging   EEG adult  Result Date: 02/13/2022 Lora Havens, MD     02/24/2022 11:35 AM Patient Name: [name] MRN: [id number] Epilepsy Attending: Lora Havens Referring Physician/Provider: Kipp Brood, MD Date: 02/05/2022 Duration: 22.41 mins Patient history: 76yo M s/p cardiac  arrest. EEG to evaluate for seizure Level of alertness:  comatose AEDs during EEG study: None Technical aspects: This EEG study was done with scalp electrodes positioned according to the 10-20 International system of electrode placement. Electrical activity was reviewed with band pass filter of 1-70Hz , sensitivity of 7 uV/mm, display speed of 84mm/sec with a 60Hz  notched filter applied as appropriate. EEG data were recorded continuously and digitally stored.  Video monitoring was available and reviewed as appropriate. Description: EEG showed continuous generalized background attenuation which was not reactive to tactile stimulation.  Hyperventilation and photic stimulation were not performed.   ABNORMALITY -Background attenuation, generalized IMPRESSION: This study is suggestive of profound diffuse encephalopathy, nonspecific etiology.  No seizures or epileptiform discharges were seen throughout the recording. Lora Havens   DG Abd 1 View  Result Date: 03/05/2022 CLINICAL DATA:  The OG tube insertion EXAM: ABDOMEN - 1 VIEW COMPARISON:  Chest radiograph 02/16/2022 FINDINGS: Enteric tube extends just below the left hemidiaphragm, likely terminating at the level of the GE junction. Multiple calcifications in the right abdomen are consistent with gallstones. Prosthetic mitral valve and distal TIPS of cardiac assist device leads are seen. Heart appears  mildly enlarged. IMPRESSION: OG tube terminates just below the left hemidiaphragm, likely at the level of the GE junction. Electronically Signed   By: Acquanetta Belling M.D.   On: 04/02/22 09:20   CT HEAD WO CONTRAST ( )  Result Date: 04-02-2022 CLINICAL DATA:  Mental status change, unknown cause. Status post cardiac arrest EXAM: CT HEAD WITHOUT CONTRAST TECHNIQUE: Contiguous axial images were obtained from the base of the skull through the vertex without intravenous contrast. RADIATION DOSE REDUCTION: This exam was performed according to the departmental  dose-optimization program which includes automated exposure control, adjustment of the mA and/or kV according to patient size and/or use of iterative reconstruction technique. COMPARISON:  12/01/2021 FINDINGS: Brain: There is atrophy and chronic small vessel disease changes. No acute intracranial abnormality. Specifically, no hemorrhage, hydrocephalus, mass lesion, acute infarction, or significant intracranial injury. Vascular: No hyperdense vessel or unexpected calcification. Skull: No acute calvarial abnormality. Sinuses/Orbits: No acute findings. Mucosal thickening throughout the paranasal sinuses. Other: None IMPRESSION: Atrophy, chronic microvascular disease. No acute intracranial abnormality. Electronically Signed   By: Charlett Nose M.D.   On: 2022-04-02 08:40   DG Chest Portable 1 View  Result Date: 2022-04-02 CLINICAL DATA:  Ventilator dependence. EXAM: PORTABLE CHEST 1 VIEW COMPARISON:  02/22/2022 FINDINGS: 0819 hours. Endotracheal tube tip is 3.5 cm above the base of the carina. The NG tube passes just into the stomach with proximal side port in the lower esophagus. Left permanent pacemaker again noted. Low volume film with patchy airspace disease in the left mid lung and right base. Pulmonary vascular congestion evident. The cardio pericardial silhouette is enlarged. No substantial pleural effusion. Telemetry leads overlie the chest. IMPRESSION: 1. Patchy airspace disease left mid lung and right base. Asymmetric edema versus multifocal pneumonia. 2. NG tube tip is just below the GE junction with proximal side port in the distal esophagus. 3. Endotracheal tube tip 3.5 cm above the base of the carina. Electronically Signed   By: Kennith Center M.D.   On: 04/02/22 08:29     Medications:     Current Medications:  acetaminophen  650 mg Oral Q4H   Or   acetaminophen (TYLENOL) oral liquid 160 mg/5 mL  650 mg Per Tube Q4H   Or   acetaminophen  650 mg Rectal Q4H   docusate  100 mg Per Tube BID    heparin  5,000 Units Subcutaneous Q8H   pantoprazole  40 mg Per Tube Daily   polyethylene glycol  17 g Per Tube Daily    Infusions:  sodium chloride 250 mL (02-Apr-2022 1034)   magnesium sulfate bolus IVPB     norepinephrine (LEVOPHED) Adult infusion     potassium chloride 10 mEq (04/02/2022 1036)   propofol (DIPRIVAN) infusion 15 mcg/kg/min (2022-04-02 1119)       Assessment/Plan   VF arrest: - in setting of severe electrolyte disturbances - Echo 1/21 EF 35-40% RV ok. mechanical MVR ok - s/p CRT-P in past  - rhythm now stable. Electrolytes being actively supped/ Keep K > 4.0 Mg > 2.0 - Concern for severe anoxic brain injury based on exam - EP consulted to interrogate device  - Repeat echo   2. Chronic systolic CHF: - Echo 1/21 EF 35-40% RV ok. mechanical MVR ok - Multiple admits for ADHF - Has been noncompliant with clinic f/u - Volume status currently ok.  - Repeat echo - If he recovers will adjust GDMT as tolerated  3. CKD IIIb: - daily BMET  4. Hypokalemia/hypomagnesemia: - supp  aggressively  5. Anoxic brain injury: - suspect severe - EEG ongoing - continue to follow  6. S/p Mechanical MVR - recheck echo - start heparin when INR < 2.5  7. AF, chronic - rate controlled  Length of Stay: 0  FINCH, LINDSAY N, PA-C  02/10/2022, 11:56 AM  Advanced Heart Failure Team Pager [phone] (M-F; 7a - 5p)  Please contact [facility] for night-coverage after hours (4p -7a ) and weekends on [email or web address]   Agree with above.  76 y/o male with h/o IVDA, MV disease s/p STJ MVR, CHF due to NICM   Several recent admits for ADHF. Now admitted with VF arrest in setting of electrolyte abnormalities. Down time probably > 10 mins  General:  Intubated/sedated HEENT: upward gaze + ETT Neck: supple. no JVD. Carotids 2+ bilat; no bruits. No lymphadenopathy or thryomegaly appreciated. Cor: PMI nondisplaced. Irregular rate & rhythm. Mech s1 Lungs: clear Abdomen: soft, nontender,  nondistended. No hepatosplenomegaly. No bruits or masses. Good bowel sounds. Extremities: no cyanosis, clubbing, rash, edema Neuro: unresponsive. No gag. Upward gaze  VF in setting of marked electrolyte abnormalities and NICM. > 10 mins down time. Rhythm now stable bu concern for severe anoxic brain injury.   - supp lytes - hemodynamic support as required - normothermia - repeat echo - start heparin if INR < 2.5 for mMVR - follow EEG.  - follow neuro exam   CRITICAL CARE Performed by: Glori Bickers  Total critical care time: 50 minutes  Critical care time was exclusive of separately billable procedures and treating other patients.  Critical care was necessary to treat or prevent imminent or life-threatening deterioration.  Critical care was time spent personally by me (independent of midlevel providers or residents) on the following activities: development of treatment plan with patient and/or surrogate as well as nursing, discussions with consultants, evaluation of patient's response to treatment, examination of patient, obtaining history from patient or surrogate, ordering and performing treatments and interventions, ordering and review of laboratory studies, ordering and review of radiographic studies, pulse oximetry and re-evaluation of patient's condition.   Glori Bickers, MD  12:38 PM

## 2022-03-03 NOTE — Procedures (Signed)
Patient Name: [name]  MRN: [id number]  Epilepsy Attending: Charlsie Quest  Referring Physician/Provider: Lynnell Catalan, MD  Date: 03/31/22 Duration: 22.41 mins  Patient history: 76yo M s/p cardiac arrest. EEG to evaluate for seizure  Level of alertness:  comatose  AEDs during EEG study: None  Technical aspects: This EEG study was done with scalp electrodes positioned according to the 10-20 International system of electrode placement. Electrical activity was reviewed with band pass filter of 1-70Hz , sensitivity of 7 uV/mm, display speed of 48mm/sec with a 60Hz  notched filter applied as appropriate. EEG data were recorded continuously and digitally stored.  Video monitoring was available and reviewed as appropriate.  Description: EEG showed continuous generalized background attenuation which was not reactive to tactile stimulation.  Hyperventilation and photic stimulation were not performed.     ABNORMALITY -Background attenuation, generalized   IMPRESSION: This study is suggestive of profound diffuse encephalopathy, nonspecific etiology.  No seizures or epileptiform discharges were seen throughout the recording.   Merin Borjon 

## 2022-03-03 NOTE — H&P (Signed)
NAME:  [name], MRN:  [id number], DOB:  Feb 02, 1946, LOS: 0 ADMISSION DATE:  02/03/2022, CONSULTATION DATE:  02/15/2022 REFERRING MD:  Rubin Payor - RD [facility], CHIEF COMPLAINT:  VF arrest    History of Present Illness:  76 year old man who suffered a witness VF cardiac arrest requiring defibrillation x7. Intubated in the field. Received amiodarone bolus.   Recent discharge from [facility] who follows him closely following gout flair treated with a prednisone taper. No change in cardiac medications at that time, but hypokalemia and hypomagnesemia were both treated at that time and patient was restarted on diuretics at discharge because of crackles on examination.   Pertinent  Medical History   Past Medical History:  Diagnosis Date   Acute GI bleeding 06/27/2021   Anemia 01/29/2016   Atrial fibrillation (HCC)    post op.  s/p dc-cv 04/2008. 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  a- Echo 07/2008 Ef 55% mild peroprosthetic MVR with High transmitral gradient (mean 14).  b- normal coronaries by cath 12/2007   BPH (benign prostatic hyperplasia)    Cardiac resynchronization therapy pacemaker (CRT-P) in place 06/22/2011   CHF (congestive heart failure) (HCC)    EF 35-40 % 2011 due to valvular disease and diastolic dysfunction   Chronic back pain    CKD (chronic kidney disease), stage III (HCC)    COVID-19 virus infection 07/24/2020   Tested positive on July 16, 2020   Dysphagia    with normal barium swallow 09/2008   Endocarditis    GERD (gastroesophageal reflux disease)    Heavy alcohol use    history   History of atrial flutter 03/13/2008   History of atrial flutter - resolved.    History of GI bleed    Hypertension    IV drug abuse (HCC)    history of   Lower GI bleed 01/2016   Memory impairment 12/24/2015   Pacemaker 2010     Significant Hospital Events: Including procedures, antibiotic start and stop dates in addition to other pertinent events   8/29 - admitted following arrest.   Interim History / Subjective:  Currently unresponsive, but on no vasoactive infusion.   Objective   Blood pressure 106/74, pulse (!) 55, temperature (!) 96.9 F (36.1 C), resp. rate (!) 22, SpO2 100 %.    Vent Mode: PRVC FiO2 (%):  [40 %-100 %] 40 % Set Rate:  [18 bmp] 18 bmp Vt Set:  [550 mL] 550 mL PEEP:  [5 cmH20] 5 cmH20 Plateau Pressure:  [12 cmH20] 12 cmH20  No intake or output data in the 24 hours ending 02/20/2022 0913 There were no vitals filed for this visit.  Examination: General: appears stated age with expected muscle mass. HENT: orally intubated. No evidence of trauma.  Lungs: clear to auscultation bilaterally, overbreathing the ventilator, acceptable airway pressures. Cardiovascular: skin abrasion from CPR device. Bradycardic with mechanical S1. No murmurs appreciated.  Abdomen: Soft and non-tender.  Extremities: warm. + brawny bilateral LE edema.  Neuro: unresponsive to verbal stimulation. + sucking reflex. Pupils 63mm and reactive, no doll's eyes, + cough with facial grimacing, no gag. No response to painful stimulation in limbs. GU: Foley catheter in place, clear urine  Ancillary tests personally reviewed:  CT head: age appropriate atrophy, nil acute CXR: ETT in appropriate position.  RA,RV and CS PM leads in place, no defibrillator. Bilateral peri-hilar infiltrates consistent with pulmonary edema.  K:2.5  Magnesium: 1.4 Lactate: 4.3 INR: 3.0 EKG: afib with slow ventricular response. No ST/T changes. Occasional PVC EMS strips show fine VF     Assessment & Plan:   Critically ill due VF cardiac arrest requiring defibrillation in the field and now comatose from anoxic encephalopathy requiring mechanical ventilation for airway protection  -Continue full mechanical ventilatory support -Initiate  normothermia postarrest targeted temperature management protocol -Initial EEG to rule out nonconvulsive status -Mandatory sedation to 48 hours -Neuro prognostication off sedation at 96 hours.  Ventricular fibrillation due to hypokalemia and hypomagnesemia in the context of nonischemic cardiomyopathy, likely secondary to diuretic use. -As patient currently bradycardic will hold on additional amiodarone unless ventricular arrhythmias recur. -We will focus instead on potassium and magnesium repletion.  Follow-up chemistry ordered. -Verify with family degree of alcohol use which may have contributed to hypomagnesemia -Interrogate [facility].  Nonischemic cardiomyopathy with ejection fraction 35-40% -Repeat echocardiogram -Serial troponins to rule out ACS although unlikely. -Currently does not appear in shock.  Will trend lactic acid to ensure clearance. -Acceptable oxygenation, but appears to have volume overload by chest x-ray.  Defer diuresis until after electrolyte repletion.  Atrial fibrillation with slow ventricular response -Hold rate limiting agents at this time as bradycardia may have increased premature complexes and possible R on T phenomena contributing to arrest -Remains therapeutically anticoagulated on residual warfarin effect.  Best Practice (right click and "Reselect all SmartList Selections" daily)   Diet/type: NPO DVT prophylaxis: prophylactic heparin  GI prophylaxis: PPI Lines: N/A Foley:  Yes, and it is still needed Code Status:  full code Last date of multidisciplinary goals of care discussion [pending family update.]  CRITICAL CARE Performed by: Lynnell Catalan   Total critical care time: 50 minutes  Critical care time was exclusive of separately billable procedures and treating other patients.  Critical care was necessary to treat or prevent imminent or life-threatening deterioration.  Critical care was time spent personally by me on the following  activities: development of treatment plan with patient and/or surrogate as well as nursing, discussions with consultants, evaluation of patient's response to treatment, examination of patient, obtaining history from patient or surrogate, ordering and performing treatments and interventions, ordering and review of laboratory studies, ordering and review of radiographic studies, pulse oximetry, re-evaluation of patient's condition and participation in multidisciplinary rounds.  Lynnell Catalan, MD [facility] ICU Physician [facility]  Pager: [phone] Mobile: [phone] After hours: [phone].

## 2022-03-03 NOTE — Consult Note (Addendum)
Cardiology Consultation   Patient ID: [name] MRN: [id number]; DOB: 11-Nov-1945  Admit date: 02/06/2022 Date of Consult: 03/01/2022  PCP:  Lucious Groves, Trainer Providers Cardiologist:  None  Electrophysiologist:  Virl Axe, MD  {    Patient Profile:   [name] is a 76 y.o. 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 Lynetta Mare.  Device information Abbott CRT-P implanted 03/11/2011, gen change 12/13/2017  History of Present Illness:   Mr. Gelwicks saw Dr. Caryl Comes Jan 2023, discussed historicalkly his CRT-P palced in setting of profound 1st degree Avblock and CM w/EF 35-40% at that time (2012).   He was recovering from a recent hospitalization with GIB, back on warfarin.  He was volume OL and started on torsemide.  Seems he goes long stints/years without being seen by cardiology or EP  He last saw Dr. Marta Antu Oct 2021  He has had several hospitalizations this year 8/20 - 02/24/22 for knee pain > gout, noted marked hypokalemia  Discharged on K+ 6/22-6/25/23 for CHF exacerbation diuresed and transitioned back to home meds, some suspicion of poor medication compliance at home 6/16-6/17/23 w/CHF exacerbation in setting of missed torsemide 5/29- 12/05/21 CHF exacerbation, discussed perhaps poor understanding of his medications contribute to medication nonappearance, hypokalemic, fall (reported as mechanical) 4/21/-10/26/21 CHF exacerbation, felt 2/2 poor medication compliance was getting help from a friend with this, described pt as having dementia 3/13-3/14/23 w/TIA, therapeutic INR, + orthostatic hypotension 3/7-09/11/21 CHF exacerbation  He was admitted THIS time this morning after a OOH cardiac arrest. EMS  reviewed, on their arrival [facility] is doing CPR pt agonal and unresponsive having had 2 AED shocks 06:47 EMS 360J CPR, O2 360 360 Orotracheal intubation, IO access 360J, IVF 360 300mg  amio 360 Epi ROSC 07:12 Versed/fentanyl tracings reviewed note VF, VT and Afib  Admitted to CCM services, described as comatose 2/2 anoxic encephalopathy, intubated, sedated  LABS K+ 2.5 Mag 1.4 BUN/Creat 13/1.57 HS Trop 113 Lactic acid 4.3 WBC 17 H/H 10/33 Plts 546 INR 3.0   Getting K+ runs No pressors  AFib 50's-60's on tele, occ-frequent PVCs, no VT  Device interrogation Battery is good Programmed VVI 40 <1%VP LV lead is programmed subthreshold (off) RV lead measurements look ok, last threshold in jan 2023 CorVue looks about threshold, not markedly overloaded One HVR episode noted today Onset by device clock was 06:36AM Makes his down time at least 36 min to ROSC  Past Medical History:  Diagnosis Date   Acute GI bleeding 06/27/2021   Anemia 01/29/2016   Atrial fibrillation ([place])    post op.  s/p dc-cv 04/2008. 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  a- Echo 07/2008 Ef 55% mild peroprosthetic MVR with High transmitral gradient (mean 14).  b- normal coronaries by cath 12/2007   BPH (benign prostatic hyperplasia)    Cardiac resynchronization therapy pacemaker (CRT-P) in place 06/22/2011   CHF (congestive heart failure) (HCC)    EF 35-40 % 2011 due to valvular disease and diastolic dysfunction   Chronic back pain  CKD (chronic kidney disease), stage III ([place])    COVID-19 virus infection 07/24/2020   Tested positive on July 16, 2020   Dysphagia    with normal barium swallow 09/2008   Endocarditis    GERD (gastroesophageal reflux disease)    Heavy alcohol use    history   History of atrial flutter 03/13/2008   History of  atrial flutter - resolved.    History of GI bleed    Hypertension    IV drug abuse ([place])    history of   Lower GI bleed 01/2016   Memory impairment 12/24/2015   Pacemaker 2010    Past Surgical History:  Procedure Laterality Date   BIOPSY  06/29/2021   Procedure: BIOPSY;  Surgeon: Rush Landmark Telford Nab., MD;  Location: [facility] ENDOSCOPY;  Service: Gastroenterology;;   [place] N/A 12/13/2017   Procedure: BIV PACEMAKER GENERATOR CHANGEOUT;  Surgeon: [name], MD;  Location: [place] CV LAB;  Service: Cardiovascular;  Laterality: N/A;   CARDIAC VALVE REPLACEMENT     mitral valve, st jude mechanical    COLONOSCOPY N/A 03/26/2014   Procedure: COLONOSCOPY;  Surgeon: Milus Banister, MD;  Location: [place];  Service: Endoscopy;  Laterality: N/A;   ENTEROSCOPY N/A 03/28/2014   Procedure: ENTEROSCOPY;  Surgeon: Milus Banister, MD;  Location: [place];  Service: Endoscopy;  Laterality: N/A;   ENTEROSCOPY N/A 06/29/2021   Procedure: ENTEROSCOPY;  Surgeon: Rush Landmark Telford Nab., MD;  Location: [place];  Service: Gastroenterology;  Laterality: N/A;   ESOPHAGOGASTRODUODENOSCOPY N/A 03/25/2014   Procedure: ESOPHAGOGASTRODUODENOSCOPY (EGD);  Surgeon: Gatha Mayer, MD;  Location: [facility] ENDOSCOPY;  Service: Endoscopy;  Laterality: N/A;   GIVENS CAPSULE STUDY N/A 03/26/2014   Procedure: GIVENS CAPSULE STUDY;  Surgeon: Milus Banister, MD;  Location: [place];  Service: Endoscopy;  Laterality: N/A;   HOT HEMOSTASIS N/A 06/29/2021   Procedure: HOT HEMOSTASIS (ARGON PLASMA COAGULATION/BICAP);  Surgeon: Irving Copas., MD;  Location: [place];  Service: Gastroenterology;  Laterality: N/A;   PACEMAKER INSERTION     inserted about 4-5 years ago   [place]  06/29/2021   Procedure: SUBMUCOSAL TATTOO INJECTION;  Surgeon: Irving Copas., MD;  Location: [place];  Service: Gastroenterology;;     Home Medications:  Prior  to Admission medications   Medication Sig Start Date End Date Taking? Authorizing Provider  acetaminophen (TYLENOL) 500 MG tablet Take 1,000 mg by mouth every 6 (six) hours as needed for moderate pain.    [provider]  atorvastatin (LIPITOR) 20 MG tablet Take 20 mg by mouth every evening. 01/26/22   [provider]  carvedilol (COREG) 6.25 MG tablet Take 1 tablet (6.25 mg total) by mouth 2 (two) times daily with a meal. 12/16/20   Cato Mulligan, MD  ferrous sulfate 325 (65 FE) MG tablet Take 1 tablet (325 mg total) by mouth daily. 03/02/22 05/31/22  Lucious Groves, DO  pantoprazole (PROTONIX) 40 MG tablet TAKE 1 TABLET (40 MG TOTAL) BY MOUTH TWICE A DAY BEFORE MEALS Patient taking differently: Take 40 mg by mouth 2 (two) times daily before a meal. 10/15/21   [name], MD  polyethylene glycol (MIRALAX) 17 g packet Take 17 g by mouth daily as needed. Take as needed, no more than twice daily if experiencing constipation 12/05/21   Timothy Lasso, MD  potassium chloride SA (KLOR-CON M) 20 MEQ tablet Take 1 tablet (20 mEq total) by mouth daily for 30 doses. 02/24/22 03/26/22  Willette Cluster, MD  predniSONE (DELTASONE) 20 MG tablet Take 1 tablet (20 mg total) by mouth daily. 02/24/22   Willette Cluster, MD  rosuvastatin (CRESTOR) 10 MG tablet Take 1 tablet (10 mg total) by mouth daily. 12/30/21 12/30/22  Gwenevere Abbot, MD  tamsulosin (FLOMAX) 0.4 MG CAPS capsule Take 2 capsules (0.8 mg total) by mouth daily after breakfast. 08/29/21   Demaio, Alexa, MD  torsemide (DEMADEX) 20 MG tablet Take 1 tablet (20 mg total) by mouth 2 (two) times daily. 12/06/21 03/06/22  Dellis Filbert, MD  warfarin (COUMADIN) 5 MG tablet Take 0.5 tablets (2.5 mg total) by mouth daily at 8 pm. Take one-half (1/2) of your 5mg  peach-colored warfarin tablet, Monday, Tuesday, Wednesday, Thursday and Friday. On Saturday and Sunday, take one (1) tablet. 02/25/22   02/27/22, MD    Inpatient Medications: Scheduled Meds:   acetaminophen  650 mg Oral Q4H   Or   acetaminophen (TYLENOL) oral liquid 160 mg/5 mL  650 mg Per Tube Q4H   Or   acetaminophen  650 mg Rectal Q4H   docusate  100 mg Per Tube BID   heparin  5,000 Units Subcutaneous Q8H   pantoprazole  40 mg Per Tube Daily   polyethylene glycol  17 g Per Tube Daily   Continuous Infusions:  sodium chloride 250 mL (02/14/2022 1034)   magnesium sulfate bolus IVPB     norepinephrine (LEVOPHED) Adult infusion     potassium chloride 10 mEq (02/14/2022 1036)   propofol (DIPRIVAN) infusion 15 mcg/kg/min (02/20/2022 1119)   PRN Meds: [START ON 03/05/2022] acetaminophen **OR** [START ON 03/05/2022] acetaminophen (TYLENOL) oral liquid 160 mg/5 mL **OR** [START ON 03/05/2022] acetaminophen, busPIRone **OR** busPIRone, fentaNYL (SUBLIMAZE) injection, fentaNYL (SUBLIMAZE) injection, ondansetron (ZOFRAN) IV  Allergies:   No Known Allergies  Social History:   Social History   Socioeconomic History   Marital status: Divorced    Spouse name: Not on file   Number of children: 9   Years of education: Not on file   Highest education level: Not on file  Occupational History   Occupation: retired    Comment: [employer]  Tobacco Use   Smoking status: Former    Years: 0.00    Types: Cigarettes   Smokeless tobacco: Never  Vaping Use   Vaping Use: Never used  Substance and Sexual Activity   Alcohol use: No    Alcohol/week: 0.0 standard drinks of alcohol   Drug use: No   Sexual activity: Not Currently  Other Topics Concern   Not on file  Social History Narrative   Current Social History 02/16/2019        Patient lives with family (baby's mama, daughter and 4 grandkids:  Two yo twins, 69 yo and 65 yo in a home which is 1 story. There are 4 steps with handrails up to the entrance the patient uses.       Patient's method of transportation is via family member (baby's mama).      The highest level of education was high school diploma.      The patient currently  retired.      Identified important Relationships are "My Baby's 5, [name]."       Pets : None       Interests / Fun: Sitting outdoors watching the birds and planes."       Exercise riding a bike 5 miles 3 days/week      Current Stressors: The grandkids  Religious / Personal Beliefs: "I believe in God."       [name], RN, BSN       Social Determinants of Health   Financial Resource Strain: Low Risk  (10/03/2021)   Overall Financial Resource Strain (CARDIA)    Difficulty of Paying Living Expenses: Not very hard  Food Insecurity: No Food Insecurity (11/28/2021)   Hunger Vital Sign    Worried About Running Out of Food in the Last Year: Never true    Ran Out of Food in the Last Year: Never true  Transportation Needs: No Transportation Needs (11/28/2021)   PRAPARE - Administrator, Civil Service (Medical): No    Lack of Transportation (Non-Medical): No  Physical Activity: Sufficiently Active (10/03/2021)   Exercise Vital Sign    Days of Exercise per Week: 3 days    Minutes of Exercise per Session: 50 min  Stress: Not on file  Social Connections: Unknown (10/03/2021)   Social Connection and Isolation Panel [NHANES]    Frequency of Communication with Friends and Family: More than three times a week    Frequency of Social Gatherings with Friends and Family: More than three times a week    Attends Religious Services: Not on Marketing executive or Organizations: No    Attends Banker Meetings: Never    Marital Status: Not on file  Intimate Partner Violence: Not on file    Family History:   Family History  Problem Relation Age of Onset   Coronary artery disease Mother    Cancer Father        GI   Cancer Brother        Lung     ROS:  Please see the history of present illness.  All other ROS reviewed and negative.     Physical Exam/Data:   Vitals:   03-27-2022 0900 March 27, 2022 0915 2022-03-27 1000 03-27-22 1104  BP: 106/74 102/71  119/82   Pulse: (!) 55 (!) 50 (!) 44 (!) 53  Resp: (!) 22 (!) 23 17 (!) 25  Temp:   (!) 96.1 F (35.6 C) (!) 95.9 F (35.5 C)  SpO2: 100% 100% 99% 100%   No intake or output data in the 24 hours ending 03/27/22 1127    02/24/2022    5:00 AM 02/23/2022    8:00 AM 01/13/2022    3:30 PM  Last 3 Weights  Weight (lbs) 184 lb 8.4 oz 181 lb 181 lb 9.6 oz  Weight (kg) 83.7 kg 82.101 kg 82.373 kg     There is no height or weight on file to calculate BMI.  General:  Well nourished, well developed HEENT: normal Neck: no JVD Vascular: No carotid bruits Cardiac:  irreg-irreg, no murmurs Lungs: intubated, clear, no wheezing, rhonchi or rales  Abd: soft  Ext: no edema Musculoskeletal:  No deformities Skin: warm and dry  Neuro:  unable to assess Psych: unable to assess   EKG:  The EKG was personally reviewed and demonstrates:    AFib 66bpm,, PVC, no c lear/acute ischemic changes noted  Telemetry:  Telemetry was personally reviewed and demonstrates:   Afib 50's-60's, occ-frequent PVCs  Relevant CV Studies:  10/24/2021: TTE 1. Pt in atrial fibrillation at time of study. Akinesis of the  inferobasal and inferolateral wall; overall moderate LV dysfunction; S/P  MVR with mean gradient 6 mmHg and trace MR.   2. Left ventricular ejection fraction, by estimation, is 35 to 40%. The  left ventricle has moderately decreased function. The left ventricle  demonstrates regional wall motion abnormalities (see scoring  diagram/findings for description). There is mild  left ventricular hypertrophy. Left ventricular diastolic parameters are  indeterminate. Elevated left atrial pressure.   3. Right ventricular systolic function is normal. The right ventricular  size is mildly enlarged. There is moderately elevated pulmonary artery  systolic pressure.   4. Left atrial size was severely dilated.   5. Right atrial size was moderately dilated.   6. The mitral valve has been repaired/replaced. Trivial  mitral valve  regurgitation. No evidence of mitral stenosis. There is a mechanical valve  present in the mitral position.   7. The aortic valve is tricuspid. Aortic valve regurgitation is not  visualized. No aortic stenosis is present.   8. The inferior vena cava is dilated in size with <50% respiratory  variability, suggesting right atrial pressure of 15 mmHg.   Comparison(s): No significant change from prior study.   TTE in 2010 showed normal EF with elevated gradient across MV (mean 14) with perivalvular leak. Follow-up TEE in June 2010 with EF 40-45% mild stenosis mean gradient = 8. No vegetation or abscess.    Echo 8/12 EF 35% mild perivalvular MR.  Echo 3/13 EF 50% mild MVR stable with mild peri-valvular leak.  ECHO 12/16/2012 EF 50% stable MVR ECHO 12/2014 - EF 25-30%  Grade I DD, mechanical mitral valve with mean gradient 6 mmHg, no significant MR.  ECHO 09/2015- EF 35-40%. Grade I DD Mitral Valve ok no stenosis    Laboratory Data:  High Sensitivity Troponin:   Recent Labs  Lab 02/06/2022 0805  TROPONINIHS 113*     Chemistry Recent Labs  Lab 03/04/2022 0805  NA 138  K 2.5*  CL 105  CO2 22  GLUCOSE 187*  BUN 13  CREATININE 1.57*  CALCIUM 7.6*  MG 1.4*  GFRNONAA 45*  ANIONGAP 11    Recent Labs  Lab 02/12/2022 0805  PROT 5.5*  ALBUMIN 2.5*  AST 71*  ALT 44  ALKPHOS 68  BILITOT 1.1   Lipids No results for input(s): "CHOL", "TRIG", "HDL", "LABVLDL", "LDLCALC", "CHOLHDL" in the last 168 hours.  Hematology Recent Labs  Lab 02/21/2022 0805  WBC 17.0*  RBC 3.70*  HGB 10.1*  HCT 33.1*  MCV 89.5  MCH 27.3  MCHC 30.5  RDW 19.2*  PLT 546*   Thyroid No results for input(s): "TSH", "FREET4" in the last 168 hours.  BNPNo results for input(s): "BNP", "PROBNP" in the last 168 hours.  DDimer No results for input(s): "DDIMER" in the last 168 hours.   Radiology/Studies:  DG Abd 1 View Result Date: 02/03/2022 CLINICAL DATA:  The OG tube insertion EXAM: ABDOMEN - 1  VIEW COMPARISON:  Chest radiograph 02/28/2022 FINDINGS: Enteric tube extends just below the left hemidiaphragm, likely terminating at the level of the GE junction. Multiple calcifications in the right abdomen are consistent with gallstones. Prosthetic mitral valve and distal TIPS of cardiac assist device leads are seen. Heart appears mildly enlarged. IMPRESSION: OG tube terminates just below the left hemidiaphragm, likely at the level of the GE junction. Electronically Signed   By: Miachel Roux M.D.   On: 02/17/2022 09:20   CT HEAD WO CONTRAST (5MM) Result Date: 02/24/2022 CLINICAL DATA:  Mental status change, unknown cause. Status post cardiac arrest EXAM: CT HEAD WITHOUT CONTRAST TECHNIQUE: Contiguous axial images were obtained from the base of the skull through the vertex without intravenous contrast. RADIATION DOSE REDUCTION: This  exam was performed according to the departmental dose-optimization program which includes automated exposure control, adjustment of the mA and/or kV according to patient size and/or use of iterative reconstruction technique. COMPARISON:  12/01/2021 FINDINGS: Brain: There is atrophy and chronic small vessel disease changes. No acute intracranial abnormality. Specifically, no hemorrhage, hydrocephalus, mass lesion, acute infarction, or significant intracranial injury. Vascular: No hyperdense vessel or unexpected calcification. Skull: No acute calvarial abnormality. Sinuses/Orbits: No acute findings. Mucosal thickening throughout the paranasal sinuses. Other: None IMPRESSION: Atrophy, chronic microvascular disease. No acute intracranial abnormality. Electronically Signed   By: Rolm Baptise M.D.   On: 03/02/2022 08:40   DG Chest Portable 1 View Result Date: 02/15/2022 CLINICAL DATA:  Ventilator dependence. EXAM: PORTABLE CHEST 1 VIEW COMPARISON:  02/22/2022 FINDINGS: 0819 hours. Endotracheal tube tip is 3.5 cm above the base of the carina. The NG tube passes just into the stomach  with proximal side port in the lower esophagus. Left permanent pacemaker again noted. Low volume film with patchy airspace disease in the left mid lung and right base. Pulmonary vascular congestion evident. The cardio pericardial silhouette is enlarged. No substantial pleural effusion. Telemetry leads overlie the chest. IMPRESSION: 1. Patchy airspace disease left mid lung and right base. Asymmetric edema versus multifocal pneumonia. 2. NG tube tip is just below the GE junction with proximal side port in the distal esophagus. 3. Endotracheal tube tip 3.5 cm above the base of the carina. Electronically Signed   By: Misty Stanley M.D.   On: 03/02/2022 08:29     Assessment and Plan:   VF arrest Marked hypokalemia, and low mag as well. I will get his device transmission for review/completeness  HF team will be seeing him He has had numerous recent hospitalizations, no cardiology follow up with them  If he has recovery, need to consider ICD, despite hypokalemia perhaps given his hx Last EF in April was 35-40% CM is described as NICM/DCM HS Trop after several defibrillations only 113 and EKG does not appear ischemic Will defer to HF team any new ischemic eval/caths, if/when able   2. Mechanical MVR 3. Permanent AFib Warfarin outpt, INR today 3.0 Defer to CCM.HF teams anticoagulation   Dr. Quentin Ore will see later today   Risk Assessment/Risk Scores:     For questions or updates, please contact [facility] Please consult [email or web address] for contact info under    Signed, Baldwin Jamaica, PA-C  02/14/2022 11:27 AM

## 2022-03-03 NOTE — Progress Notes (Signed)
Handover report received from ED nurse Delorise Jackson

## 2022-03-03 NOTE — Progress Notes (Signed)
Ventilator patient transported from Trauma A to CT2 and back without any complications.

## 2022-03-04 ENCOUNTER — Inpatient Hospital Stay (HOSPITAL_COMMUNITY): Payer: 59

## 2022-03-04 DIAGNOSIS — J9601 Acute respiratory failure with hypoxia: Secondary | ICD-10-CM

## 2022-03-04 DIAGNOSIS — I4901 Ventricular fibrillation: Secondary | ICD-10-CM | POA: Diagnosis not present

## 2022-03-04 DIAGNOSIS — G9340 Encephalopathy, unspecified: Secondary | ICD-10-CM

## 2022-03-04 DIAGNOSIS — N179 Acute kidney failure, unspecified: Secondary | ICD-10-CM

## 2022-03-04 LAB — PHOSPHORUS
Phosphorus: 3.7 mg/dL (ref 2.5–4.6)
Phosphorus: 3.1 mg/dL (ref 2.5–4.6)

## 2022-03-04 LAB — BASIC METABOLIC PANEL
Anion gap: 17 — ABNORMAL HIGH (ref 5–15)
BUN: 18 mg/dL (ref 8–23)
CO2: 21 mmol/L — ABNORMAL LOW (ref 22–32)
Calcium: 8.7 mg/dL — ABNORMAL LOW (ref 8.9–10.3)
Chloride: 101 mmol/L (ref 98–111)
Creatinine, Ser: 1.97 mg/dL — ABNORMAL HIGH (ref 0.61–1.24)
GFR, Estimated: 34 mL/min — ABNORMAL LOW (ref >60)
Glucose, Bld: 109 mg/dL — ABNORMAL HIGH (ref 70–99)
Potassium: 3.5 mmol/L (ref 3.5–5.1)
Sodium: 139 mmol/L (ref 135–145)

## 2022-03-04 LAB — BLOOD CULTURE ID PANEL (REFLEXED) - BCID2

## 2022-03-04 LAB — HEPATIC FUNCTION PANEL
ALT: 41 U/L (ref 0–44)
AST: 60 U/L — ABNORMAL HIGH (ref 15–41)
Albumin: 3 g/dL — ABNORMAL LOW (ref 3.5–5.0)
Alkaline Phosphatase: 62 U/L (ref 38–126)
Bilirubin, Direct: 0.6 mg/dL — ABNORMAL HIGH (ref 0.0–0.2)
Indirect Bilirubin: 1.1 mg/dL — ABNORMAL HIGH (ref 0.3–0.9)
Total Bilirubin: 1.7 mg/dL — ABNORMAL HIGH (ref 0.3–1.2)
Total Protein: 6.2 g/dL — ABNORMAL LOW (ref 6.5–8.1)

## 2022-03-04 LAB — GLUCOSE, CAPILLARY
Glucose-Capillary: 107 mg/dL — ABNORMAL HIGH (ref 70–99)
Glucose-Capillary: 108 mg/dL — ABNORMAL HIGH (ref 70–99)
Glucose-Capillary: 96 mg/dL (ref 70–99)
Glucose-Capillary: 98 mg/dL (ref 70–99)
Glucose-Capillary: 83 mg/dL (ref 70–99)

## 2022-03-04 LAB — CBC
HCT: 41 % (ref 39.0–52.0)
Hemoglobin: 12.3 g/dL — ABNORMAL LOW (ref 13.0–17.0)
MCH: 26.8 pg (ref 26.0–34.0)
MCHC: 30 g/dL (ref 30.0–36.0)
MCV: 89.3 fL (ref 80.0–100.0)
Platelets: 530 10*3/uL — ABNORMAL HIGH (ref 150–400)
RBC: 4.59 MIL/uL (ref 4.22–5.81)
RDW: 19.9 % — ABNORMAL HIGH (ref 11.5–15.5)
WBC: 16.6 10*3/uL — ABNORMAL HIGH (ref 4.0–10.5)
nRBC: 0.4 % — ABNORMAL HIGH (ref 0.0–0.2)

## 2022-03-04 LAB — MAGNESIUM
Magnesium: 2.1 mg/dL (ref 1.7–2.4)
Magnesium: 2.1 mg/dL (ref 1.7–2.4)

## 2022-03-04 LAB — TRIGLYCERIDES: Triglycerides: 115 mg/dL (ref <150)

## 2022-03-04 LAB — PROTIME-INR
INR: 2.9 — ABNORMAL HIGH (ref 0.8–1.2)
Prothrombin Time: 30.2 seconds — ABNORMAL HIGH (ref 11.4–15.2)

## 2022-03-04 MED ORDER — POTASSIUM CHLORIDE 20 MEQ PO PACK
40.0000 meq | PACK | Freq: Once | ORAL | Status: AC
  Administered 2022-03-04: 40 meq
  Filled 2022-03-04: qty 2

## 2022-03-04 MED ORDER — VITAL AF 1.2 CAL PO LIQD
1000.0000 mL | ORAL | Status: DC
  Administered 2022-03-04 – 2022-03-08 (×7): 1000 mL

## 2022-03-04 MED ORDER — LORAZEPAM 2 MG/ML IJ SOLN
1.0000 mg | INTRAMUSCULAR | Status: DC | PRN
  Administered 2022-03-04 – 2022-03-06 (×5): 2 mg via INTRAVENOUS
  Filled 2022-03-04 (×5): qty 1

## 2022-03-04 NOTE — Progress Notes (Signed)
TOC received consult for poc. RN informed CSW point of contact determined. Consult complete. TOC will continue to follow .

## 2022-03-04 NOTE — Progress Notes (Signed)
Initial Nutrition Assessment  DOCUMENTATION CODES:   Not applicable  INTERVENTION:   Tube Feeding via OG:  Vital AF 1.2 at 65 ml/hr Begin at 30 ml/hr; titrate by 10 mL q 8 hours until goal rate of 65 This provides 1872 kcals, 117 g of protein and 1186 mL of free water  Additional calories from propofol currently  NUTRITION DIAGNOSIS:   Inadequate oral intake related to acute illness as evidenced by NPO status.  GOAL:   Patient will meet greater than or equal to 90% of their needs  MONITOR:   TF tolerance, Weight trends, Vent status, Labs  REASON FOR ASSESSMENT:   Consult Enteral/tube feeding initiation and management  ASSESSMENT:   76 yo male admitted post VF arrest requiring intubation, NICM with EF worsened this admit-EF 25-30%. PMH includes CKD 3, CHF, GERD, hx heavy EtOH use, endocarditis, HTN  8/29 Admitted  Pt remains sedated on vent support; on propofol. Not requiring pressors. TTM normothermia initated  OG tube in place; placement in stomach per abd xray report by Radiologist  Unable to obtain diet and weight history from patient at this time.  Weight 82.2 kg  Labs: reviewed; hypokalemia and hypomagnesemia resolved  Meds: miralax, colace   NUTRITION - FOCUSED PHYSICAL EXAM:  Flowsheet Row Most Recent Value  Orbital Region Mild depletion  Upper Arm Region Unable to assess  Thoracic and Lumbar Region Unable to assess  Buccal Region Unable to assess  Temple Region Moderate depletion  Clavicle Bone Region Moderate depletion  Clavicle and Acromion Bone Region Moderate depletion  Scapular Bone Region Moderate depletion  Dorsal Hand Unable to assess  Patellar Region Moderate depletion  Anterior Thigh Region Moderate depletion  Posterior Calf Region Unable to assess       Diet Order:   Diet Order     None       EDUCATION NEEDS:   Not appropriate for education at this time  Skin:  Skin Assessment: Reviewed RN Assessment  Last BM:   PTA  Height:   Ht Readings from Last 1 Encounters:  02/23/22 5\' 8"  (1.727 m)    Weight:   Wt Readings from Last 1 Encounters:  03/04/22 82.2 kg    BMI:  Body mass index is 27.55 kg/m.  Estimated Nutritional Needs:   Kcal:  1800-2000 kcals  Protein:  105-120 g  Fluid:  >/= 1.8 L   03/06/22 MS, RDN, LDN, CNSC Registered Dietitian 3 Clinical Nutrition RD Pager and On-Call Pager Number Located in [facility]

## 2022-03-04 NOTE — Progress Notes (Addendum)
Advanced Heart Failure Rounding Note  PCP-Cardiologist: None   Subjective:    08/29: [place] VF arrest in setting of severe electrolyte disturbances  Remains intubated. Not following commands off sedation. Initial EEG negative.  No further VF on telemetry.  Objective:   Weight Range: 82.2 kg Body mass index is 27.55 kg/m.   Vital Signs:   Temp:  [95.9 F (35.5 C)-99.3 F (37.4 C)] 99.1 F (37.3 C) (08/30 1000) Pulse Rate:  [42-80] 75 (08/30 0900) Resp:  [11-29] 17 (08/30 0900) BP: (81-134)/(57-102) 118/90 (08/30 0900) SpO2:  [95 %-100 %] 100 % (08/30 0900) FiO2 (%):  [40 %] 40 % (08/30 0755) Weight:  [82.2 kg] 82.2 kg (08/30 0400)    Weight change: Filed Weights   03/04/22 0400  Weight: 82.2 kg    Intake/Output:   Intake/Output Summary (Last 24 hours) at 03/04/2022 1019 Last data filed at 03/04/2022 0700 Gross per 24 hour  Intake 1344.32 ml  Output 695 ml  Net 649.32 ml      Physical Exam    General:  Critically ill on vent. HEENT: + ETT Neck: Supple. JVP not elevated . Carotids 2+ bilat; no bruits.  Cor: PMI nondisplaced. Irregular rate & rhythm. No rubs, gallops or murmurs. Lungs: Clear Abdomen: Soft, nontender, nondistended.  Extremities: No cyanosis, clubbing, rash, edema Neuro: Unresponsive  Telemetry   Afib 70s-80s, 5-10 PVCs/min   Labs    CBC Recent Labs    02/27/2022 0805 02/20/2022 0906 03/04/22 0534  WBC 17.0*  --  16.6*  NEUTROABS 13.9*  --   --   HGB 10.1* 9.9* 12.3*  HCT 33.1* 29.0* 41.0  MCV 89.5  --  89.3  PLT 546*  --  123456*   Basic Metabolic Panel Recent Labs    02/10/2022 2208 03/04/22 0534  NA 139 139  K 4.2 3.5  CL 106 101  CO2 22 21*  GLUCOSE 125* 109*  BUN 17 18  CREATININE 1.74* 1.97*  CALCIUM 8.2* 8.7*  MG 2.1 2.1  PHOS  --  3.7   Liver Function Tests Recent Labs    02/11/2022 0805 03/04/22 0534  AST 71* 60*  ALT 44 41  ALKPHOS 68 62  BILITOT 1.1 1.7*  PROT 5.5* 6.2*  ALBUMIN 2.5* 3.0*   No  results for input(s): "LIPASE", "AMYLASE" in the last 72 hours. Cardiac Enzymes No results for input(s): "CKTOTAL", "CKMB", "CKMBINDEX", "TROPONINI" in the last 72 hours.  BNP: BNP (last 3 results) Recent Labs    12/19/21 1129 12/25/21 1401 02/22/22 1814  BNP 238.8* 366.8* 416.4*    ProBNP (last 3 results) No results for input(s): "PROBNP" in the last 8760 hours.   D-Dimer No results for input(s): "DDIMER" in the last 72 hours. Hemoglobin A1C No results for input(s): "HGBA1C" in the last 72 hours. Fasting Lipid Panel Recent Labs    03/04/22 0534  TRIG 115   Thyroid Function Tests No results for input(s): "TSH", "T4TOTAL", "T3FREE", "THYROIDAB" in the last 72 hours.  Invalid input(s): "FREET3"  Other results:   Imaging    ECHOCARDIOGRAM COMPLETE  Result Date: 02/28/2022    ECHOCARDIOGRAM REPORT   Patient Name:   [name] Date of Exam: 02/25/2022 Medical Rec #:  [id number]       Height:       68.0 in Accession #:    [id number]      Weight:       184.5 lb Date of Birth:  1946-06-20  BSA:          1.975 m Patient Age:    76 years        BP:           102/71 mmHg Patient Gender: M               HR:           63 bpm. Exam Location:  Inpatient Procedure: 2D Echo, Cardiac Doppler and Color Doppler Indications:    Cardiac arrest  History:        Patient has prior history of Echocardiogram examinations, most                 recent 10/24/2021. CHF, TIA; Arrythmias:Atrial Flutter.  Sonographer:    Mikki Harbor Referring Phys: [id number] RAVI AGARWALA  Sonographer Comments: Echo performed with patient supine and on artificial respirator. IMPRESSIONS  1. Left ventricular ejection fraction, by estimation, is 25 to 30%. The left ventricle has severely decreased function. The left ventricle demonstrates regional wall motion abnormalities (inferior and inferolateral WMAs). The left ventricular internal cavity size was moderately dilated (despite PLAX LVIDD). There is moderate  concentric left ventricular hypertrophy. Left ventricular diastolic parameters are indeterminate.  2. Right ventricular systolic function is moderately reduced. The right ventricular size is moderately enlarged. There is moderately elevated pulmonary artery systolic pressure. The estimated right ventricular systolic pressure is 47.7 mmHg.  3. Left atrial size was severely dilated.  4. Right atrial size was severely dilated.  5. Prosthetic valve: PHT 83 ms, TVI 2.71. The mitral valve has been repaired/replaced. Mild mitral valve regurgitation. The mean mitral valve gradient is 3.0 mmHg.  6. Tricuspid valve regurgitation is mild to moderate.  7. The aortic valve was not well visualized. Aortic valve regurgitation is trivial. No aortic stenosis is present. Comparison(s): LVEF appears worse from prior. FINDINGS  Left Ventricle: Left ventricular ejection fraction, by estimation, is 25 to 30%. The left ventricle has severely decreased function. The left ventricle demonstrates regional wall motion abnormalities. The left ventricular internal cavity size was moderately dilated. There is moderate concentric left ventricular hypertrophy. Left ventricular diastolic parameters are indeterminate.  LV Wall Scoring: The mid inferolateral segment is akinetic. The basal inferolateral segment and basal inferior segment are hypokinetic. Right Ventricle: The right ventricular size is moderately enlarged. No increase in right ventricular wall thickness. Right ventricular systolic function is moderately reduced. There is moderately elevated pulmonary artery systolic pressure. The tricuspid  regurgitant velocity is 2.86 m/s, and with an assumed right atrial pressure of 15 mmHg, the estimated right ventricular systolic pressure is 47.7 mmHg. Left Atrium: Left atrial size was severely dilated. Right Atrium: Right atrial size was severely dilated. Pericardium: There is no evidence of pericardial effusion. Mitral Valve: Prosthetic valve: PHT  83 ms, TVI 2.71. The mitral valve has been repaired/replaced. Mild mitral valve regurgitation. MV peak gradient, 10.8 mmHg. The mean mitral valve gradient is 3.0 mmHg. Tricuspid Valve: The tricuspid valve is normal in structure. Tricuspid valve regurgitation is mild to moderate. No evidence of tricuspid stenosis. Aortic Valve: The aortic valve was not well visualized. Aortic valve regurgitation is trivial. Aortic regurgitation PHT measures 613 msec. No aortic stenosis is present. Aortic valve mean gradient measures 2.0 mmHg. Aortic valve peak gradient measures 3.5 mmHg. Aortic valve area, by VTI measures 1.51 cm. Pulmonic Valve: The pulmonic valve was normal in structure. Pulmonic valve regurgitation is not visualized. No evidence of pulmonic stenosis. Aorta: The aortic root and ascending aorta are  structurally normal, with no evidence of dilitation. IAS/Shunts: No atrial level shunt detected by color flow Doppler.  LEFT VENTRICLE PLAX 2D LVIDd:         5.15 cm LVIDs:         4.60 cm LV PW:         1.25 cm LV IVS:        1.25 cm LVOT diam:     1.80 cm LV SV:         31 LV SV Index:   16 LVOT Area:     2.54 cm  LV Volumes (MOD) LV vol d, MOD A2C: 114.0 ml LV vol d, MOD A4C: 106.0 ml LV vol s, MOD A2C: 68.1 ml LV vol s, MOD A4C: 77.7 ml LV SV MOD A2C:     45.9 ml LV SV MOD A4C:     106.0 ml LV SV MOD BP:      36.3 ml RIGHT VENTRICLE RV S prime:     6.53 cm/s LEFT ATRIUM              Index        RIGHT ATRIUM           Index LA diam:        5.90 cm  2.99 cm/m   RA Area:     32.00 cm LA Vol (A2C):   166.0 ml 84.05 ml/m  RA Volume:   115.00 ml 58.23 ml/m LA Vol (A4C):   170.0 ml 86.08 ml/m LA Biplane Vol: 172.0 ml 87.09 ml/m  AORTIC VALVE                    PULMONIC VALVE AV Area (Vmax):    1.71 cm     PV Vmax:       0.43 m/s AV Area (Vmean):   1.52 cm     PV Peak grad:  0.7 mmHg AV Area (VTI):     1.51 cm AV Vmax:           93.05 cm/s AV Vmean:          61.150 cm/s AV VTI:            0.206 m AV Peak Grad:       3.5 mmHg AV Mean Grad:      2.0 mmHg LVOT Vmax:         62.50 cm/s LVOT Vmean:        36.600 cm/s LVOT VTI:          0.122 m LVOT/AV VTI ratio: 0.59 AI PHT:            613 msec  AORTA Ao Root diam: 3.90 cm Ao Asc diam:  3.20 cm MITRAL VALVE                TRICUSPID VALVE MV Area (PHT): 2.21 cm     TR Peak grad:   32.7 mmHg MV Area VTI:   0.70 cm     TR Vmax:        286.00 cm/s MV Peak grad:  10.8 mmHg MV Mean grad:  3.0 mmHg     SHUNTS MV Vmax:       1.64 m/s     Systemic VTI:  0.12 m MV Vmean:      69.5 cm/s    Systemic Diam: 1.80 cm MV Decel Time: 344 msec MV E velocity: 132.50 cm/s Rudean Haskell MD Electronically signed by Rudean Haskell MD Signature  Date/Time: 02/05/2022/1:42:44 PM    Final    DG Abd Portable 1V  Result Date: 02/22/2022 CLINICAL DATA:  Enteric tube placement EXAM: PORTABLE ABDOMEN - 1 VIEW COMPARISON:  Previous studies including the examination done earlier today FINDINGS: There is interval repositioning of enteric tube with its tip in the region of body of stomach. There are multiple gallbladder stones. Liver appears to be enlarged in size. Bowel gas pattern is nonspecific. Biventricular pacer leads are noted in the heart. There is prosthetic cardiac valve. IMPRESSION: Tip of enteric tube is seen in the region of body of stomach. Nonspecific bowel gas pattern. Gallbladder stones. Electronically Signed   By: Elmer Picker M.D.   On: 02/13/2022 13:32     Medications:     Scheduled Medications:  acetaminophen  650 mg Oral Q4H   Or   acetaminophen (TYLENOL) oral liquid 160 mg/5 mL  650 mg Per Tube Q4H   Or   acetaminophen  650 mg Rectal Q4H   Chlorhexidine Gluconate Cloth  6 each Topical Q0600   docusate  100 mg Per Tube BID   mouth rinse  15 mL Mouth Rinse Q2H   pantoprazole  40 mg Per Tube Daily   polyethylene glycol  17 g Per Tube Daily    Infusions:  sodium chloride Stopped (02/18/2022 2345)   norepinephrine (LEVOPHED) Adult infusion     propofol  (DIPRIVAN) infusion 10 mcg/kg/min (03/04/22 0700)    PRN Medications: [START ON 03/05/2022] acetaminophen **OR** [START ON 03/05/2022] acetaminophen (TYLENOL) oral liquid 160 mg/5 mL **OR** [START ON 03/05/2022] acetaminophen, busPIRone **OR** busPIRone, fentaNYL (SUBLIMAZE) injection, fentaNYL (SUBLIMAZE) injection, LORazepam, ondansetron (ZOFRAN) IV, mouth rinse   Assessment/Plan   VF arrest: - in setting of severe electrolyte disturbances - HS troponin up to 2800 after multiple rounds of CPR. No CAD on last LHC in 2009 - Echo 1/21 EF 35-40% RV ok. mechanical MVR ok - Echo 08/23 EF 25-30%, inferior and inferolateral WMA, RV moderately reduced, severe BAE, mechanical MVR okay - s/p CRT-P in past  - rhythm now stable. Electrolytes being actively supped/ Keep K > 4.0 Mg > 2.0 - will hold off on amiodarone - Concern for severe anoxic brain injury based on exam. No plans for ICD upgrade at this time - EP interrogated device. Total down time to achieving ROSC 36 min.   2. Chronic systolic CHF: - NICM dating back to 2009 - Echo 1/21 EF 35-40% RV ok. mechanical MVR ok - Echo 04/23: EF 35-40%, RV moderately reduced, mechanical MV okay - Echo 08/23: EF 25-30%, inferior and inferolateral WMA, RV moderately reduced, severe BAE, mechanical MVR okay - Multiple admits for ADHF - Has been noncompliant with clinic f/u - Volume status currently ok.  - If he recovers will adjust GDMT as tolerated   3. CKD IIIb: - Scr post arrest 1.57>1.74>1.97 - Baseline Scr appears 1.5-1.8   4. Hypokalemia/hypomagnesemia: - supp aggressively   5. Anoxic brain injury: - suspect severe - initial EEG negative - not following commands off sedation   6. S/p Mechanical MVR - MV okay on echo this admit - supratherapeutic INR on admit, start heparin when INR < 2.5   7. AF, chronic - rate controlled - anticoagulated with warfarin.    Length of Stay: 1  FINCH, LINDSAY N, PA-C  03/04/2022, 10:19 AM  Advanced  Heart Failure Team Pager [phone] (M-F; 7a - 5p)  Please contact [facility] for night-coverage after hours (5p -7a ) and weekends on [email or web address]  Agree with above.   Remains intubated. Non-responsive. Off pressors. Hemodynamically stable. Head CT with diffuse atrophy otherwise unremarkable. EEG with diffuse slowing but no epileptiform activity. Scr up slightly. Remains in chronic AF  General:  Intubated/ unresponsive to pain  HEENT: +ETT Neck: supple. no JVD. Carotids 2+ bilat; no bruits. No lymphadenopathy or thryomegaly appreciated. Cor: PMI nondisplaced. Irregular rate & rhythm. No rubs, gallops or murmurs. Lungs: clear Abdomen: soft, nontender, nondistended. No hepatosplenomegaly. No bruits or masses. Good bowel sounds. Extremities: no cyanosis, clubbing, rash, edema Neuro: unresponsive on vent. No response to pain. No gag  Rhythm stable. Now off pressors. Remains unresponsive with high concern for significant anoxic brain injury. D/w CCM - will need more time for prognostication. (96 hours). Continue hemodynamic support. INR 2.9. Start heparin (no bolus) when INR < 2.5   CRITICAL CARE Performed by: [name]  Total critical care time: 35 minutes  Critical care time was exclusive of separately billable procedures and treating other patients.  Critical care was necessary to treat or prevent imminent or life-threatening deterioration.  Critical care was time spent personally by me (independent of midlevel providers or residents) on the following activities: development of treatment plan with patient and/or surrogate as well as nursing, discussions with consultants, evaluation of patient's response to treatment, examination of patient, obtaining history from patient or surrogate, ordering and performing treatments and interventions, ordering and review of laboratory studies, ordering and review of radiographic studies, pulse oximetry and re-evaluation of patient's  condition.  [name], MD  11:23 AM

## 2022-03-04 NOTE — Progress Notes (Signed)
ANTICOAGULATION CONSULT NOTE - Initial Consult  Pharmacy Consult for heparin  Indication:  mechanical MVR  and atrial fibrillation  No Known Allergies  Patient Measurements: Weight: 82.2 kg (181 lb 3.5 oz) Heparin Dosing Weight: 82.2 kg (TBW)  IBW: 68.4 kg  Vital Signs: Temp: 99.3 F (37.4 C) (08/30 0900) Temp Source: Bladder (08/30 0800) BP: 118/90 (08/30 0900) Pulse Rate: 75 (08/30 0900)  Labs: Recent Labs    March 31, 2022 0805 2022/03/31 0906 Mar 31, 2022 1226 03/31/2022 1537 03/31/22 1805 31-Mar-2022 2208 03/04/22 0534  HGB 10.1* 9.9*  --   --   --   --  12.3*  HCT 33.1* 29.0*  --   --   --   --  41.0  PLT 546*  --   --   --   --   --  530*  LABPROT 31.1*  --   --   --   --   --  30.2*  INR 3.0*  --   --   --   --   --  2.9*  CREATININE 1.57*  --  1.50*  --   --  1.74* 1.97*  TROPONINIHS 113*  --  2,159* 2,801* 2,452*  --   --    Estimated Creatinine Clearance: 32.3 mL/min (A) (by C-G formula based on SCr of 1.97 mg/dL (H)).  Medical History: Past Medical History:  Diagnosis Date   Acute GI bleeding 06/27/2021   Anemia 01/29/2016   Atrial fibrillation (HCC)    post op.  s/p dc-cv 04/2008. 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  a- Echo 07/2008 Ef 55% mild peroprosthetic MVR with High transmitral gradient (mean 14).  b- normal coronaries by cath 12/2007   BPH (benign prostatic hyperplasia)    Cardiac resynchronization therapy pacemaker (CRT-P) in place 06/22/2011   CHF (congestive heart failure) (HCC)    EF 35-40 % 2011 due to valvular disease and diastolic dysfunction   Chronic back pain    CKD (chronic kidney disease), stage III (HCC)    COVID-19 virus infection 07/24/2020   Tested positive on July 16, 2020   Dysphagia    with normal barium swallow 09/2008   Endocarditis    GERD (gastroesophageal reflux disease)     Heavy alcohol use    history   History of atrial flutter 03/13/2008   History of atrial flutter - resolved.    History of GI bleed    Hypertension    IV drug abuse (HCC)    history of   Lower GI bleed 01/2016   Memory impairment 12/24/2015   Pacemaker 2010   Medications:  Infusions:   sodium chloride Stopped (03/31/22 2345)   norepinephrine (LEVOPHED) Adult infusion     propofol (DIPRIVAN) infusion 10 mcg/kg/min (03/04/22 0700)   Assessment: ES is a 76 year old male admitted for witnessed VF cardiac arrest requiring defibrillation x7. Patient was on warfarin prior to admission. Prior to admission warfarin dose was 5 mg Saturday and Sunday, 2.5 mg Monday, Tuesday, Wednesday, Thursday, Friday. Last warfarin dose unknown. INR today is therapeutic at 2.9. Hgb 12.3 stable, platelets 530. Pharmacy consulted to start heparin when INR <2.5 for mechanical MVR and Afib.   Goal of Therapy:  Heparin level 0.3-0.7 units/ml Monitor platelets by anticoagulation protocol: Yes   Plan:  Start heparin when INR<2.5.  Monitor daily CBC and INR. Monitor for signs/symptoms of bleeding.  Georga Hacking, Pharm.D PGY1 Pharmacy Resident 03/04/2022 10:07 AM

## 2022-03-04 NOTE — Progress Notes (Signed)
Wife and daughter updated at bedside regarding ttm and timing of prognostication. All questions answered.

## 2022-03-04 NOTE — Progress Notes (Signed)
NAME:  [name], MRN:  [id number], DOB:  1945/10/17, LOS: 1 ADMISSION DATE:  02/09/2022, CONSULTATION DATE:  02/17/2022 REFERRING MD:  Rubin Payor - RD [facility], CHIEF COMPLAINT:  VF arrest    History of Present Illness:  76 year old man who suffered a witness VF cardiac arrest requiring defibrillation x7. Intubated in the field. Received amiodarone bolus.   Recent discharge from [facility] who follows him closely following gout flair treated with a prednisone taper. No change in cardiac medications at that time, but hypokalemia and hypomagnesemia were both treated at that time and patient was restarted on diuretics at discharge because of crackles on examination.   Pertinent  Medical History   Past Medical History:  Diagnosis Date   Acute GI bleeding 06/27/2021   Anemia 01/29/2016   Atrial fibrillation (HCC)    post op.  s/p dc-cv 04/2008. 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  a- Echo 07/2008 Ef 55% mild peroprosthetic MVR with High transmitral gradient (mean 14).  b- normal coronaries by cath 12/2007   BPH (benign prostatic hyperplasia)    Cardiac resynchronization therapy pacemaker (CRT-P) in place 06/22/2011   CHF (congestive heart failure) (HCC)    EF 35-40 % 2011 due to valvular disease and diastolic dysfunction   Chronic back pain    CKD (chronic kidney disease), stage III (HCC)    COVID-19 virus infection 07/24/2020   Tested positive on July 16, 2020   Dysphagia    with normal barium swallow 09/2008   Endocarditis    GERD (gastroesophageal reflux disease)    Heavy alcohol use    history   History of atrial flutter 03/13/2008   History of atrial flutter - resolved.    History of GI bleed    Hypertension    IV drug abuse (HCC)    history of   Lower GI bleed 01/2016   Memory impairment 12/24/2015   Pacemaker 2010     Significant Hospital Events: Including procedures, antibiotic start and stop dates in addition to other pertinent events   8/29 - admitted following arrest.  8/30 weaning propofol to assess mental status. 8/20 Echo EF 25-30%  Interim History / Subjective:  Currently unresponsive however on 20 Propofol due to questionable seizure activity overnight. EEG negative (spot EEG).   Objective   Blood pressure 111/87, pulse 73, temperature 99 F (37.2 C), temperature source Bladder, resp. rate 18, weight 82.2 kg, SpO2 100 %.    Vent Mode: PRVC FiO2 (%):  [40 %] 40 % Set Rate:  [18 bmp] 18 bmp Vt Set:  [550 mL] 550 mL PEEP:  [5 cmH20] 5 cmH20 Plateau Pressure:  [14 cmH20-21 cmH20] 15 cmH20   Intake/Output Summary (Last 24 hours) at 03/04/2022 0845 Last data filed at 03/04/2022 0700 Gross per 24 hour  Intake 1344.32 ml  Output 695 ml  Net 649.32 ml   Filed Weights   03/04/22 0400  Weight: 82.2 kg    Examination: General: Elderly male, resting in bed, in NAD. Neuro: Sedated, unresponsive. HEENT: [place]/AT. Sclerae anicteric. ETT in place. Cardiovascular: RRR, no M/R/G.  Lungs: Respirations even and unlabored.  CTA bilaterally, No W/R/R. Abdomen: BS x 4, soft, NT/ND.  Musculoskeletal: No gross deformities, no edema.  Skin: Intact, warm, no rashes.   Ancillary tests personally reviewed:  CT  head: age appropriate atrophy, nil acute CXR: ETT in appropriate position. RA,RV and CS PM leads in place, no defibrillator. Bilateral peri-hilar infiltrates consistent with pulmonary edema.  EKG: afib with slow ventricular response. No ST/T changes. Occasional PVC EMS strips show fine VF Echo 8/29: EF 25-30%, worse then prior.     Assessment & Plan:   Critically ill due VF cardiac arrest requiring defibrillation in the field and now comatose from anoxic encephalopathy requiring mechanical ventilation for airway protection  - Continue full mechanical ventilatory support. - Continue  normothermia postarrest targeted temperature management protocol. - Wean propofol as able, assess mental status and if no purposeful activity then remain sedated x 48 hours from admission. - Full neuro prognostication off sedation at 96 hours.  Ventricular fibrillation due to hypokalemia and hypomagnesemia in the context of nonischemic cardiomyopathy, likely secondary to diuretic use. Atrial fibrillation with slow ventricular response (on warfarin) S/p mechanical MVR. - Will hold on additional amiodarone unless ventricular arrhythmias recur. - We will focus instead on potassium and magnesium repletion, keep K > 4, Mg > 2. - Verify with family degree of alcohol use which may have contributed to hypomagnesemia - Daily INR, start heparin when < 2.5. - EP and cards following, appreciate the assistance.  Nonischemic cardiomyopathy with ejection fraction 35-40% and new echo this admit worsened to EF 25-30%. - Cards following, appreciate the assistance. - Adjust GDMT per cards depending on his recovery.  Questionable seizure activity overnight 8/29 - initial EEG negative. - Lorazepam PRN. - Might need cEEG depending on course.   Best Practice (right click and "Reselect all SmartList Selections" daily)  Diet/type: tubefeeds DVT prophylaxis: prophylactic heparin  GI prophylaxis: PPI Lines: N/A Foley:  Yes, and it is still needed Code Status:  full code Last date of multidisciplinary goals of care discussion [pending family update.]   CC time: 30 min.   Rutherford Guys, PA - [facility] For pager details, please see AMION or use Epic chat  After 1900, please call [facility] for cross coverage needs 03/04/2022, 9:01 AM

## 2022-03-05 ENCOUNTER — Inpatient Hospital Stay (HOSPITAL_COMMUNITY): Payer: 59

## 2022-03-05 ENCOUNTER — Encounter: Payer: 59 | Admitting: Internal Medicine

## 2022-03-05 DIAGNOSIS — G9340 Encephalopathy, unspecified: Secondary | ICD-10-CM

## 2022-03-05 DIAGNOSIS — R4182 Altered mental status, unspecified: Secondary | ICD-10-CM | POA: Diagnosis not present

## 2022-03-05 DIAGNOSIS — I4901 Ventricular fibrillation: Secondary | ICD-10-CM | POA: Diagnosis not present

## 2022-03-05 LAB — HEPATIC FUNCTION PANEL
ALT: 32 U/L (ref 0–44)
AST: 47 U/L — ABNORMAL HIGH (ref 15–41)
Albumin: 2.6 g/dL — ABNORMAL LOW (ref 3.5–5.0)
Alkaline Phosphatase: 61 U/L (ref 38–126)
Bilirubin, Direct: 0.5 mg/dL — ABNORMAL HIGH (ref 0.0–0.2)
Indirect Bilirubin: 0.7 mg/dL (ref 0.3–0.9)
Total Bilirubin: 1.2 mg/dL (ref 0.3–1.2)
Total Protein: 5.9 g/dL — ABNORMAL LOW (ref 6.5–8.1)

## 2022-03-05 LAB — GLUCOSE, CAPILLARY
Glucose-Capillary: 103 mg/dL — ABNORMAL HIGH (ref 70–99)
Glucose-Capillary: 106 mg/dL — ABNORMAL HIGH (ref 70–99)
Glucose-Capillary: 106 mg/dL — ABNORMAL HIGH (ref 70–99)
Glucose-Capillary: 112 mg/dL — ABNORMAL HIGH (ref 70–99)
Glucose-Capillary: 89 mg/dL (ref 70–99)
Glucose-Capillary: 92 mg/dL (ref 70–99)
Glucose-Capillary: 98 mg/dL (ref 70–99)

## 2022-03-05 LAB — MAGNESIUM
Magnesium: 2.1 mg/dL (ref 1.7–2.4)
Magnesium: 2 mg/dL (ref 1.7–2.4)

## 2022-03-05 LAB — BASIC METABOLIC PANEL
Anion gap: 10 (ref 5–15)
BUN: 19 mg/dL (ref 8–23)
CO2: 23 mmol/L (ref 22–32)
Calcium: 8.4 mg/dL — ABNORMAL LOW (ref 8.9–10.3)
Chloride: 107 mmol/L (ref 98–111)
Creatinine, Ser: 1.87 mg/dL — ABNORMAL HIGH (ref 0.61–1.24)
GFR, Estimated: 36 mL/min — ABNORMAL LOW (ref >60)
Glucose, Bld: 95 mg/dL (ref 70–99)
Potassium: 3.8 mmol/L (ref 3.5–5.1)
Sodium: 140 mmol/L (ref 135–145)

## 2022-03-05 LAB — CBC
HCT: 38.8 % — ABNORMAL LOW (ref 39.0–52.0)
Hemoglobin: 11.9 g/dL — ABNORMAL LOW (ref 13.0–17.0)
MCH: 27 pg (ref 26.0–34.0)
MCHC: 30.7 g/dL (ref 30.0–36.0)
MCV: 88.2 fL (ref 80.0–100.0)
Platelets: 512 10*3/uL — ABNORMAL HIGH (ref 150–400)
RBC: 4.4 MIL/uL (ref 4.22–5.81)
RDW: 20.3 % — ABNORMAL HIGH (ref 11.5–15.5)
WBC: 9.5 10*3/uL (ref 4.0–10.5)
nRBC: 0.4 % — ABNORMAL HIGH (ref 0.0–0.2)

## 2022-03-05 LAB — PHOSPHORUS
Phosphorus: 3 mg/dL (ref 2.5–4.6)
Phosphorus: 2.5 mg/dL (ref 2.5–4.6)

## 2022-03-05 LAB — PROTIME-INR
INR: 3.1 — ABNORMAL HIGH (ref 0.8–1.2)
Prothrombin Time: 31.4 seconds — ABNORMAL HIGH (ref 11.4–15.2)

## 2022-03-05 MED ORDER — SODIUM CHLORIDE 0.9 % IV SOLN
4000.0000 mg | INTRAVENOUS | Status: AC
  Administered 2022-03-05: 4000 mg via INTRAVENOUS
  Filled 2022-03-05: qty 40

## 2022-03-05 MED ORDER — LORAZEPAM 2 MG/ML IJ SOLN
2.0000 mg | INTRAMUSCULAR | Status: AC
  Administered 2022-03-05: 2 mg via INTRAVENOUS
  Filled 2022-03-05: qty 1

## 2022-03-05 MED ORDER — POTASSIUM CHLORIDE 20 MEQ PO PACK
40.0000 meq | PACK | Freq: Once | ORAL | Status: AC
Start: 2022-03-05 — End: 2022-03-05
  Administered 2022-03-05: 40 meq
  Filled 2022-03-05: qty 2

## 2022-03-05 NOTE — Consult Note (Signed)
Neurology Consultation  Reason for Consult: Possible anoxic injury  Referring Physician: Katrinka Blazing  CC: cardiac arrest  History is obtained from: Chart Review  HPI: [name] is a 76 y.o. male with a past medical history of endocarditis > mechanical MVR (2/2 IV/DU 2009), HTN, GIB (Hospitalized 12/22 for GI bleeding thought probably angiodysplastic lesion in the proximal jejunum), CKD (IIIb), AFib on warfarin (permanent), NICM who presented as a Vfib arrest. He was found on the floor by his daughter on 8/29 after she heard him yell out. EMS arrived within 7 minutes and defibrillated x7 and intubated him in the field. It is presumed that cardiac arrest was precipitated by electrolyte abnormalities due to diuretic usage. He does have a pacemaker that was interrogated by the EP team. Device shows that vfib onset to ROSC was 36 minutes. Potassium on arrival was 2.5 and magnesium was 1.4.  EF is 25-30%. He is currently on 68mcg/kg/hr due to concern for seizure activity reported as twitching in the upper extremities and the face.   YME:BRAXEN to obtain due to altered mental status.   Past Medical History:  Diagnosis Date   Acute GI bleeding 06/27/2021   Anemia 01/29/2016   Atrial fibrillation (HCC)    post op.  s/p dc-cv 04/2008. 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  a- Echo 07/2008 Ef 55% mild peroprosthetic MVR with High transmitral gradient (mean 14).  b- normal coronaries by cath 12/2007   BPH (benign prostatic hyperplasia)    Cardiac resynchronization therapy pacemaker (CRT-P) in place 06/22/2011   CHF (congestive heart failure) (HCC)    EF 35-40 % 2011 due to valvular disease and diastolic dysfunction   Chronic back pain    CKD (chronic kidney disease), stage III (HCC)    COVID-19 virus infection 07/24/2020   Tested positive on July 16, 2020   Dysphagia    with normal barium swallow 09/2008   Endocarditis    GERD (gastroesophageal reflux disease)    Heavy alcohol use    history   History of atrial flutter 03/13/2008   History of atrial flutter - resolved.    History of GI bleed    Hypertension    IV drug abuse (HCC)    history of   Lower GI bleed 01/2016   Memory impairment 12/24/2015   Pacemaker 2010    Family History  Problem Relation Age of Onset   Coronary artery disease Mother    Cancer Father        GI   Cancer Brother        Lung    Social History:   reports that he has quit smoking. His smoking use included cigarettes. He has never used smokeless tobacco. He reports that he does not drink alcohol and does not use drugs.  Medications  Current Facility-Administered Medications:    0.9 %  sodium chloride infusion, 250 mL, Intravenous, Continuous, Agarwala, Ravi, MD, Stopped at 03/04/22 1215   acetaminophen (TYLENOL) tablet 650 mg, 650 mg, Oral, Q4H PRN, 650 mg at 03/05/22 1157 **OR** acetaminophen (TYLENOL) 160 MG/5ML solution 650 mg, 650 mg, Per Tube, Q4H PRN, 650 mg at 03/05/22 0540 **OR** acetaminophen (TYLENOL) suppository 650 mg, 650 mg, Rectal, Q4H PRN, Agarwala, Ravi, MD   Chlorhexidine Gluconate Cloth 2 % PADS 6 each, 6 each,  Topical, Q0600, Lynnell Catalan, MD, 6 each at 03/04/22 2004   docusate (COLACE) 50 MG/5ML liquid 100 mg, 100 mg, Per Tube, BID, Agarwala, Ravi, MD, 100 mg at 03/05/22 1108   feeding supplement (VITAL AF 1.2 CAL) liquid 1,000 mL, 1,000 mL, Per Tube, Continuous, Lorin Glass, MD, Last Rate: 60 mL/hr at 03/05/22 1200, Infusion Verify at 03/05/22 1200   fentaNYL (SUBLIMAZE) injection 25 mcg, 25 mcg, Intravenous, Q15 min PRN, Lynnell Catalan, MD   fentaNYL (SUBLIMAZE) injection 25-100 mcg, 25-100 mcg, Intravenous, Q30 min PRN, Lynnell Catalan, MD, 50 mcg at 03/04/22 0011   LORazepam (ATIVAN) injection 1-2 mg, 1-2 mg, Intravenous, Q1H PRN, Celine Mans, Rahul P, PA-C, 2 mg at 03/04/22  1803   norepinephrine (LEVOPHED) 4mg  in (0.016 mg/mL) premix infusion, 2-10 mcg/min, Intravenous, Titrated, Agarwala, Ravi, MD   ondansetron (ZOFRAN) injection 4 mg, 4 mg, Intravenous, Q6H PRN, , MD   Oral care mouth rinse, 15 mL, Mouth Rinse, Q2H, Agarwala, Ravi, MD, 15 mL at 03/05/22 1158   Oral care mouth rinse, 15 mL, Mouth Rinse, PRN, Agarwala, Ravi, MD   pantoprazole (PROTONIX) 2 mg/mL oral suspension 40 mg, 40 mg, Per Tube, Daily, Agarwala, Ravi, MD, 40 mg at 03/05/22 1115   polyethylene glycol (MIRALAX / GLYCOLAX) packet 17 g, 17 g, Per Tube, Daily, Agarwala, Ravi, MD, 17 g at 03/05/22 1108   propofol (DIPRIVAN) 1000 MG/100ML infusion, 0-50 mcg/kg/min, Intravenous, Continuous, Agarwala, Ravi, MD, Last Rate: 20.1 mL/hr at 03/05/22 1200, 40 mcg/kg/min at 03/05/22 1200   Exam: Current vital signs: BP (!) 99/53   Pulse 69   Temp 100.2 F (37.9 C)   Resp 18   Wt 84.6 kg   SpO2 98%   BMI 28.36 kg/m  Vital signs in last 24 hours: Temp:  [98.1 F (36.7 C)-100.2 F (37.9 C)] 100.2 F (37.9 C) (08/31 1300) Pulse Rate:  [59-83] 69 (08/31 1300) Resp:  [0-24] 18 (08/31 1300) BP: (92-121)/(53-91) 99/53 (08/31 1300) SpO2:  [97 %-100 %] 98 % (08/31 1300) FiO2 (%):  [40 %] 40 % (08/31 1200) Weight:  [84.6 kg] 84.6 kg (08/31 0500)  GENERAL: intubated, sedated HEENT: - Normocephalic and atraumatic, dry mm, no LN++, no Thyromegally LUNGS - mechanically ventilated CV - S1 S2, RRR ABDOMEN - Soft, nontender, nondistended with normoactive BS Ext: warm, well perfused, intact peripheral pulses, no edema  NEURO:  Mental Status: intubated, sedated with propofol, does not follow commands Cranial Nerves: PERRL3 mm/brisk.gaze is dysconjugate, left exotropia,  cough and gag are weak, corneal reflex intact bilaterally, oculocephalic reflex intact. Tongue obstructed by ETT. Head is midline Motor: no spontaneous/purposeful movement in extremities  Tone: is normal and bulk is  normal Sensation- minimal withdrawal to painful stimuli in left lower extremity, triple flexion with lower extremity nailbed compression DTRs: 2+ upper extremities, 3+ patellars with crossed adductors  Plantars: toes upgoing bilaterally Gait- deferred          Labs I have reviewed labs in epic and the results pertinent to this consultation are:  CBC    Component Value Date/Time   WBC 9.5 03/05/2022 0544   RBC 4.40 03/05/2022 0544   HGB 11.9 (L) 03/05/2022 0544   HGB 9.2 (L) 11/03/2021 1104   HCT 38.8 (L) 03/05/2022 0544   HCT 32.6 (L) 11/03/2021 1104   PLT 512 (H) 03/05/2022 0544   PLT 476 (H) 11/03/2021 1104   MCV 88.2 03/05/2022 0544   MCV 73 (L) 11/03/2021 1104   MCH 27.0 03/05/2022 0544  MCHC 30.7 03/05/2022 0544   RDW 20.3 (H) 03/05/2022 0544   RDW 18.2 (H) 11/03/2021 1104   LYMPHSABS 2.6 02/09/2022 0805   LYMPHSABS 1.6 01/04/2020 1046   MONOABS 0.2 02/09/2022 0805   EOSABS 0.3 02/14/2022 0805   EOSABS 0.1 01/04/2020 1046   BASOSABS 0.0 02/06/2022 0805   BASOSABS 0.1 01/04/2020 1046    CMP     Component Value Date/Time   NA 140 03/05/2022 0544   NA 135 12/29/2021 1037   K 3.8 03/05/2022 0544   CL 107 03/05/2022 0544   CO2 23 03/05/2022 0544   GLUCOSE 95 03/05/2022 0544   BUN 19 03/05/2022 0544   BUN 38 (H) 12/29/2021 1037   CREATININE 1.87 (H) 03/05/2022 0544   CREATININE 1.37 (H) 12/21/2014 1612   CALCIUM 8.4 (L) 03/05/2022 0544   CALCIUM 8.9 06/26/2008 2248   PROT 5.9 (L) 03/05/2022 0544   PROT 7.4 01/04/2020 1046   ALBUMIN 2.6 (L) 03/05/2022 0544   ALBUMIN 4.4 01/04/2020 1046   AST 47 (H) 03/05/2022 0544   ALT 32 03/05/2022 0544   ALKPHOS 61 03/05/2022 0544   BILITOT 1.2 03/05/2022 0544   BILITOT 0.9 01/04/2020 1046   GFRNONAA 36 (L) 03/05/2022 0544   GFRAA 41 (L) 01/15/2020 1131    Lipid Panel     Component Value Date/Time   CHOL 153 09/16/2021 0316   TRIG 115 03/04/2022 0534   HDL 43 09/16/2021 0316   CHOLHDL 3.6 09/16/2021 0316    VLDL 30 09/16/2021 0316   LDLCALC 80 09/16/2021 0316     Imaging I have reviewed the images obtained:  CT-head- Atrophy, chronic microvascular disease. No acute intracranial abnormality. 8/29 and 8/31 EEG- Suggestive of moderate to severe diffuse encephalopathy, nonspecific etiology.  No seizures or epileptiform discharges were seen throughout the recording.   Assessment: 76 y.o. male with a past medical history of endocarditis > mechanical MVR (2/2 IV/DU 2009), HTN, GIB (Hospitalized 12/22 for GI bleeding thought probably angiodysplastic lesion in the proximal jejunum), CKD (IIIb), AFib on warfarin (permanent), NICM who presented as a Vfib arrest precipitated by electrolyte abnormalities due to diuretic usage. ROSC was achieved and he was intubated in the field. We have concern for an anoxic brain injury post cardiac arrest. He has had some facial and left arm twitching that has not been captured on EEG. We will do an overnight LTM and then obtain an MRI tomorrow if pacemaker is compatible.   Recommendations: - 24 hour LTM - MRI after day 3  - Minimize sedation as tolerated   Patient seen and examined by NP/APP with MD. MD to update note as needed.   Janine Ores, DNP, FNP-BC [facility] Pager: [phone]  I have seen the patient reviewed the above note.   Patient has minimal response to noxious stimulation, possibly some withdrawal in the left lower extremity.  He has intact brainstem with partial eye opening.  At this time, likely will need to give him some time to declare himself, however given some facial twitching reported, I would favor overnight monitoring on EEG.  Neurology will continue to follow.  Roland Rack, MD [facility] [phone]  If 7pm- 7am, please page neurology on call as listed in [place].

## 2022-03-05 NOTE — Progress Notes (Signed)
EEG LTM hook up. Non MRI leads. [facility] monitoring. Test button was tested.

## 2022-03-05 NOTE — Progress Notes (Signed)
ANTICOAGULATION CONSULT NOTE - Initial Consult  Pharmacy Consult for heparin  Indication:  mechanical MVR  and atrial fibrillation  No Known Allergies  Patient Measurements: Weight: 84.6 kg (186 lb 8.2 oz) IBW: 68.4 kg Heparin Dosing Weight: 84.6 kg (TBW)   Vital Signs: Temp: 99.7 F (37.6 C) (08/31 0644) Temp Source: Bladder (08/30 2000) BP: 112/62 (08/31 0644) Pulse Rate: 78 (08/31 0644)  Labs: Recent Labs    02/28/2022 0805 02/22/2022 0906 02/16/2022 1226 02/26/2022 1537 02/28/2022 1805 02/06/2022 2208 03/04/22 0534 03/05/22 0544  HGB 10.1* 9.9*  --   --   --   --  12.3* 11.9*  HCT 33.1* 29.0*  --   --   --   --  41.0 38.8*  PLT 546*  --   --   --   --   --  530* 512*  LABPROT 31.1*  --   --   --   --   --  30.2* 31.4*  INR 3.0*  --   --   --   --   --  2.9* 3.1*  CREATININE 1.57*  --  1.50*  --   --  1.74* 1.97* 1.87*  TROPONINIHS 113*  --  2,159* 2,801* 2,452*  --   --   --     Estimated Creatinine Clearance: 34.5 mL/min (A) (by C-G formula based on SCr of 1.87 mg/dL (H)).  Medical History: Past Medical History:  Diagnosis Date   Acute GI bleeding 06/27/2021   Anemia 01/29/2016   Atrial fibrillation (HCC)    post op.  s/p dc-cv 04/2008. 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  a- Echo 07/2008 Ef 55% mild peroprosthetic MVR with High transmitral gradient (mean 14).  b- normal coronaries by cath 12/2007   BPH (benign prostatic hyperplasia)    Cardiac resynchronization therapy pacemaker (CRT-P) in place 06/22/2011   CHF (congestive heart failure) (HCC)    EF 35-40 % 2011 due to valvular disease and diastolic dysfunction   Chronic back pain    CKD (chronic kidney disease), stage III (HCC)    COVID-19 virus infection 07/24/2020   Tested positive on July 16, 2020   Dysphagia    with normal barium swallow 09/2008    Endocarditis    GERD (gastroesophageal reflux disease)    Heavy alcohol use    history   History of atrial flutter 03/13/2008   History of atrial flutter - resolved.    History of GI bleed    Hypertension    IV drug abuse (HCC)    history of   Lower GI bleed 01/2016   Memory impairment 12/24/2015   Pacemaker 2010   Medications:  Infusions:   sodium chloride Stopped (03/04/22 1215)   feeding supplement (VITAL AF 1.2 CAL) 40 mL/hr at 03/05/22 0500   norepinephrine (LEVOPHED) Adult infusion     propofol (DIPRIVAN) infusion 20 mcg/kg/min (03/05/22 0500)   Assessment: ES is a 76 year old male admitted for witnessed VF cardiac arrest requiring defibrillation x7. Patient was on warfarin prior to admission. Prior to admission warfarin dose was 5 mg Saturday and Sunday, 2.5 mg Monday, Tuesday, Wednesday, Thursday, Friday. Last warfarin dose unknown. INR today is therapeutic at 3.1. Hgb 11.9 stable, platelets 512. Pharmacy consulted to start heparin when INR <2.5 for mechanical MVR and  Afib.   Goal of Therapy:  Heparin level 0.3-0.7 units/ml Monitor platelets by anticoagulation protocol: Yes   Plan:  Start heparin when INR<2.5. Monitor daily CBC and INR. Monitor for signs/symptoms of bleeding.  Georga Hacking, Pharm.D PGY1 Pharmacy Resident 03/05/2022 7:11 AM

## 2022-03-05 NOTE — Progress Notes (Signed)
PCCM progress note  Notified by MRI technician that patient has current pacemaker that is MRI incompatible, MRI order canceled.   Sidrah Harden D. Tiburcio Pea, NP-C [facility] Personal contact information can be found on Amion  03/05/2022, 7:04 PM

## 2022-03-05 NOTE — Progress Notes (Addendum)
NAME:  [name], MRN:  [id number], DOB:  [birth year], LOS: 2 ADMISSION DATE:  03/09/2022, CONSULTATION DATE:  09-Mar-2022 REFERRING MD:  Rubin Payor - RD [facility], CHIEF COMPLAINT:  VF arrest    History of Present Illness:  76 year old man who suffered a witness VF cardiac arrest requiring defibrillation x7. Intubated in the field. Received amiodarone bolus.   Recent discharge from IMTS who follows him closely following gout flair treated with a prednisone taper. No change in cardiac medications at that time, but hypokalemia and hypomagnesemia were both treated at that time and patient was restarted on diuretics at discharge because of crackles on examination.   Pertinent  Medical History   Past Medical History:  Diagnosis Date   Acute GI bleeding 06/27/2021   Anemia 01/29/2016   Atrial fibrillation (HCC)    post op.  s/p dc-cv 04/2008. 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  a- Echo 07/2008 Ef 55% mild peroprosthetic MVR with High transmitral gradient (mean 14).  b- normal coronaries by cath 12/2007   BPH (benign prostatic hyperplasia)    Cardiac resynchronization therapy pacemaker (CRT-P) in place 06/22/2011   CHF (congestive heart failure) (HCC)    EF 35-40 % 2011 due to valvular disease and diastolic dysfunction   Chronic back pain    CKD (chronic kidney disease), stage III (HCC)    COVID-19 virus infection 07/24/2020   Tested positive on July 16, 2020   Dysphagia    with normal barium swallow 09/2008   Endocarditis    GERD (gastroesophageal reflux disease)    Heavy alcohol use    history   History of atrial flutter 03/13/2008   History of atrial flutter - resolved.    History of GI bleed    Hypertension    IV drug abuse (HCC)    history of   Lower GI bleed 01/2016   Memory impairment 12/24/2015   Pacemaker 2010     Significant Hospital Events: Including procedures, antibiotic start and stop dates in addition to other pertinent events   8/29 - admitted following arrest.  Echo EF 25-30% 8/30 weaning propofol to assess mental status  8/31 acute events overnight, remains sedated on ventilator  Interim History / Subjective:  Sedated on ventilator, when sedation lightened patient seen with significant tachypnea  Objective   Blood pressure 111/63, pulse 79, temperature 99.5 F (37.5 C), resp. rate 18, weight 84.6 kg, SpO2 100 %.    Vent Mode: PSV;CPAP FiO2 (%):  [40 %] 40 % Set Rate:  [18 bmp] 18 bmp Vt Set:  [550 mL] 550 mL PEEP:  [5 cmH20] 5 cmH20 Pressure Support:  [10 cmH20] 10 cmH20 Plateau Pressure:  [15 cmH20-20 cmH20] 19 cmH20   Intake/Output Summary (Last 24 hours) at 03/05/2022 0807 Last data filed at 03/05/2022 0500 Gross per 24 hour  Intake 1061.72 ml  Output 740 ml  Net 321.72 ml    Filed Weights   03/04/22 0400 03/05/22 0500  Weight: 82.2 kg 84.6 kg    Examination: General: Acute on chronic ill-appearing elderly gentleman lying in bed on mechanical ventilation in no acute distress HEENT: ETT, MM pink/moist, PERRL,  Neuro: Sedated on ventilator CV: s1s2 regular rate and rhythm, no murmur, rubs, or gallops,  PULM: Clear to auscultation bilaterally, no increased work of breathing,  no added breath sounds GI: soft, bowel sounds active in all 4 quadrants, non-tender, non-distended Extremities: warm/dry, no edema  Skin: no rashes or lesions  Assessment & Plan:  VF cardiac arrest requiring defibrillation in the field in the setting of severe electrolyte disturbances -Witness VF cardiac arrest requiring defibrillation x7, estimated total downtime 36 minutes Ventricular fibrillation in the setting of hypokalemia and hypomagnesemia  -Electrolytes supplemented at outpatient IMTS appointment  History of nonischemic cardiomyopathy with ejection fraction 35-40% -New echo this  admit worsened to EF 25-30%. Atrial fibrillation with slow ventricular response (on warfarin) S/p mechanical MVR. Elevated high-sensitivity troponin P: Normothermia Monitor for seizure activity Heart failure following Echo as above Continuous telemetry Hemodynamic control Demise electrolytes, K greater than 4, Mg greater than 2 GDMT as able  Acute Hypoxic Respiratory Failure  -In the setting of VF cardiac arrest  P: Continue ventilator support with lung protective strategies  Wean PEEP and FiO2 for sats greater than 90%. Head of bed elevated 30 degrees. Plateau pressures less than 30 cm H20.  Follow intermittent chest x-ray and ABG.   SAT/SBT as tolerated, mentation preclude extubation  Ensure adequate pulmonary hygiene  Follow cultures  VAP bundle in place  PAD protocol  Anoxic encephalopathy requiring mechanical ventilation for airway protection in the setting of prolonged cardiac arrest Questionable seizure activity overnight 8/29 - initial EEG negative. P: Neurology consulted 8/30: For assistance in neuro prognostication Maintain neuro protective measures; goal for eurothermia, euglycemia, eunatermia, normoxia, and PCO2 goal of 35-40 Nutrition and bowel regiment  Seizure precautions  Aspirations precautions  Likely obtain MRI 9/1 at 72-hour mark  Acute Kidney Injury superimposed on CKD stage IIIb -Baseline creatinine appears to be between 1.5-1.8 P: Follow renal function Monitor urine output Trend Bmet Avoid nephrotoxins Ensure adequate renal perfusion   Hypoalbuminemia P: Continue tube feeds today Routine supplementation  Mildly elevated LFTs likely in the setting of shock liver -AST remains elevated but improving P: Avoid hepatotoxins Intermittently trend LFTs  Best Practice (right click and "Reselect all SmartList Selections" daily)  Diet/type: tubefeeds DVT prophylaxis: prophylactic heparin  GI prophylaxis: PPI Lines: N/A Foley:  Yes, and it is  still needed Code Status:  full code Last date of multidisciplinary goals of care discussion continue daily discussions with family regarding goals of care  Critical care: 39 min   Mannat Benedetti D. Tiburcio Pea, NP-C [facility] Personal contact information can be found on Amion  03/05/2022, 9:03 AM

## 2022-03-05 NOTE — Progress Notes (Signed)
Pt. trembling in left arm upon assessment at 19:40. EEG activity button was pressed and pt. activity reported.  Pt. temperature was noted to be 38.6C. PRN acetaminophen given and ice packs placed on neck, in armpits, and groin. E-link notified of pt. temperature and interventions. Received a call from Neuro, administered 2mg  Ativan and increased propofol to 59mcg/mg/min per Dr. 43m and Dr. Amada Jupiter with neurology. Ordered loading dose of Keppra not stocked in 2H pyxis, called main pharmacy and sent message regarding missing dose, requested it be sent as soon as possible.

## 2022-03-05 NOTE — Progress Notes (Addendum)
Advanced Heart Failure Rounding Note  PCP-Cardiologist: None   Subjective:    08/29: [place] VF arrest in setting of severe electrolyte disturbances.  Initial EEG negative.  Remains intubated.  Unresponsive Objective:   Weight Range: 84.6 kg Body mass index is 28.36 kg/m.   Vital Signs:   Temp:  [98.1 F (36.7 C)-100 F (37.8 C)] 99.7 F (37.6 C) (08/31 0644) Pulse Rate:  [59-79] 78 (08/31 0644) Resp:  [0-24] 18 (08/31 0644) BP: (92-121)/(55-90) 112/62 (08/31 0644) SpO2:  [98 %-100 %] 99 % (08/31 0644) FiO2 (%):  [40 %] 40 % (08/31 0311) Weight:  [84.6 kg] 84.6 kg (08/31 0500) Last BM Date : 02/22/22  Weight change: Filed Weights   03/04/22 0400 03/05/22 0500  Weight: 82.2 kg 84.6 kg    Intake/Output:   Intake/Output Summary (Last 24 hours) at 03/05/2022 0705 Last data filed at 03/05/2022 0500 Gross per 24 hour  Intake 1061.72 ml  Output 740 ml  Net 321.72 ml      Physical Exam   General: Intubated HEENT: ETT Neck: supple. JVP 6-7 . Carotids 2+ bilat; no bruits. No lymphadenopathy or thryomegaly appreciated. Cor: PMI nondisplaced. Irregular rate & rhythm. No rubs, gallops or murmurs. Lungs: clear Abdomen: soft, nontender, nondistended. No hepatosplenomegaly. No bruits or masses. Good bowel sounds. Extremities: no cyanosis, clubbing, rash, edema Neuro: Unresponsive Telemetry    A Fib 60-70s with PVCs 5-10 per hour   Labs    CBC Recent Labs    March 25, 2022 0805 03/25/2022 0906 03/04/22 0534 03/05/22 0544  WBC 17.0*  --  16.6* 9.5  NEUTROABS 13.9*  --   --   --   HGB 10.1*   < > 12.3* 11.9*  HCT 33.1*   < > 41.0 38.8*  MCV 89.5  --  89.3 88.2  PLT 546*  --  530* 512*   < > = values in this interval not displayed.   Basic Metabolic Panel Recent Labs    53/61/44 0534 03/04/22 1703 03/05/22 0544  NA 139  --  140  K 3.5  --  3.8  CL 101  --  107  CO2 21*  --  23  GLUCOSE 109*  --  95  BUN 18  --  19  CREATININE 1.97*  --  1.87*  CALCIUM 8.7*   --  8.4*  MG 2.1 2.1 2.1  PHOS 3.7 3.1 3.0   Liver Function Tests Recent Labs    03/04/22 0534 03/05/22 0544  AST 60* 47*  ALT 41 32  ALKPHOS 62 61  BILITOT 1.7* 1.2  PROT 6.2* 5.9*  ALBUMIN 3.0* 2.6*   No results for input(s): "LIPASE", "AMYLASE" in the last 72 hours. Cardiac Enzymes No results for input(s): "CKTOTAL", "CKMB", "CKMBINDEX", "TROPONINI" in the last 72 hours.  BNP: BNP (last 3 results) Recent Labs    12/19/21 1129 12/25/21 1401 02/22/22 1814  BNP 238.8* 366.8* 416.4*    ProBNP (last 3 results) No results for input(s): "PROBNP" in the last 8760 hours.   D-Dimer No results for input(s): "DDIMER" in the last 72 hours. Hemoglobin A1C No results for input(s): "HGBA1C" in the last 72 hours. Fasting Lipid Panel Recent Labs    03/04/22 0534  TRIG 115   Thyroid Function Tests No results for input(s): "TSH", "T4TOTAL", "T3FREE", "THYROIDAB" in the last 72 hours.  Invalid input(s): "FREET3"  Other results:   Imaging    US RENAL  Result Date: 03/04/2022 CLINICAL DATA:  Acute renal insufficiency  EXAM: RENAL / URINARY TRACT ULTRASOUND COMPLETE COMPARISON:  03/28/2014 FINDINGS: Right Kidney: Renal measurements: 8.7 x 4.1 x 3.4 cm = volume: 63.3 mL. Increased renal cortical echotexture. No hydronephrosis or nephrolithiasis. No renal mass. Left Kidney: Renal measurements: 10.9 x 5.0 x 4.2 cm = volume: 119.8 mL. Echogenicity within normal limits. No mass or hydronephrosis visualized. Bladder: Bladder is decompressed with a Foley catheter. Other: None. IMPRESSION: 1. Mild right renal atrophy, with increased renal cortical echotexture consistent with medical renal disease. 2. Unremarkable left kidney. Electronically Signed   By: Sharlet Salina M.D.   On: 03/04/2022 10:45     Medications:     Scheduled Medications:  Chlorhexidine Gluconate Cloth  6 each Topical Q0600   docusate  100 mg Per Tube BID   mouth rinse  15 mL Mouth Rinse Q2H   pantoprazole  40  mg Per Tube Daily   polyethylene glycol  17 g Per Tube Daily    Infusions:  sodium chloride Stopped (03/04/22 1215)   feeding supplement (VITAL AF 1.2 CAL) 40 mL/hr at 03/05/22 0500   norepinephrine (LEVOPHED) Adult infusion     propofol (DIPRIVAN) infusion 20 mcg/kg/min (03/05/22 0500)    PRN Medications: acetaminophen **OR** acetaminophen (TYLENOL) oral liquid 160 mg/5 mL **OR** acetaminophen, fentaNYL (SUBLIMAZE) injection, fentaNYL (SUBLIMAZE) injection, LORazepam, ondansetron (ZOFRAN) IV, mouth rinse   Assessment/Plan   VF arrest: - in setting of severe electrolyte disturbances - HS troponin up to 2800 after multiple rounds of CPR. No CAD on last LHC in 2009 - Echo 1/21 EF 35-40% RV ok. mechanical MVR ok - Echo 08/23 EF 25-30%, inferior and inferolateral WMA, RV moderately reduced, severe BAE, mechanical MVR okay - s/p CRT-P in past  - No further VF - Keep K > 4.0 Mg > 2.0 - will hold off on amiodarone - Concern for severe anoxic brain injury based on exam. No plans for ICD upgrade at this time - EP interrogated device. Total down time to achieving ROSC 36 min.   2. Chronic systolic CHF: - NICM dating back to 2009 - Echo 1/21 EF 35-40% RV ok. mechanical MVR ok - Echo 04/23: EF 35-40%, RV moderately reduced, mechanical MV okay - Echo 08/23: EF 25-30%, inferior and inferolateral WMA, RV moderately reduced, severe BAE, mechanical MVR okay - Multiple admits for ADHF - Volume status stable.  - Has been noncompliant with clinic f/u - If he recovers will adjust GDMT as tolerated   3. CKD IIIb: - Scr post arrest 1.57>1.74>1.97>1.87 - Baseline Scr appears 1.5-1.8   4. Hypokalemia/hypomagnesemia: - supp K  - mag ok.    5. Anoxic brain injury: - suspect severe - initial EEG negative - Remains unresponsive.    6. S/p Mechanical MVR - MV okay on echo this admit - supratherapeutic INR on admit, start heparin when INR < 2.5 - INR 3.1. Start heparin when INR < 2.5.      7. AF, chronic - Rate controlled. - INR 3.1   Continue current plan for now.   Length of Stay: 2  Tonye Becket, NP  03/05/2022, 7:05 AM  Advanced Heart Failure Team Pager [phone] (M-F; 7a - 5p)  Please contact [facility] for night-coverage after hours (5p -7a ) and weekends on [email or web address]  Agree with above.  Remains on vent. Unresponsive on low-dose propofol. Rhythm stable. SCr trending back down. Tm 100.0    General: Intubated unresponsive HEENT: + ETT Neck: supple. no JVD. Carotids 2+ bilat; no bruits. No lymphadenopathy or thryomegaly  appreciated. Cor: PMI nondisplaced. Regular rate & rhythm. No rubs, gallops or murmurs. Lungs: clear Abdomen: soft, nontender, nondistended. No hepatosplenomegaly. No bruits or masses. Good bowel sounds. Extremities: no cyanosis, clubbing, rash, edema Neuro: unresponsive  Remains unresponsive. Concern for anoxic brain injury. Will discuss further prognostication with CCM regarding stopping propofol completely +/- brain MRI.  CRITICAL CARE Performed by: [name]  Total critical care time: 40 minutes  Critical care time was exclusive of separately billable procedures and treating other patients.  Critical care was necessary to treat or prevent imminent or life-threatening deterioration.  Critical care was time spent personally by me (independent of midlevel providers or residents) on the following activities: development of treatment plan with patient and/or surrogate as well as nursing, discussions with consultants, evaluation of patient's response to treatment, examination of patient, obtaining history from patient or surrogate, ordering and performing treatments and interventions, ordering and review of laboratory studies, ordering and review of radiographic studies, pulse oximetry and re-evaluation of patient's condition.  [name], MD  8:15 AM

## 2022-03-05 NOTE — Progress Notes (Signed)
ETT pulled back 3cm per MD order, secured at 23 from 26 at the top lip. Bilateral breath sounds noted. Patient placed back on full support at this time due to increase in WOB and tachypnea. RN made aware. Patient resting comfortably at this time.

## 2022-03-05 NOTE — Progress Notes (Addendum)
EEG reviewed, continuous generalized 3 to 4 Hz sharp activity much more prominent over the central leads highly concerning for status epilepticus.  Loaded with Keppra 4 g x 1 followed by 750 mg twice daily(renally dosed). Ativan 2mg  x 1 and increase propofol to seizure cessation.   , MD [facility] [phone]  If 7pm- 7am, please page neurology on call as listed in AMION.

## 2022-03-05 NOTE — Progress Notes (Signed)
EEG complete - results pending 

## 2022-03-05 NOTE — Procedures (Addendum)
Patient Name: [name]  MRN: [id number]  Epilepsy Attending: Charlsie Quest  Referring Physician/Provider: Lorin Glass, MD Date: 03/05/2022 Duration: 23.32 mins   Patient history: 76yo M s/p cardiac arrest. EEG to evaluate for seizure   Level of alertness:  comatose   AEDs during EEG study: None   Technical aspects: This EEG study was done with scalp electrodes positioned according to the 10-20 International system of electrode placement. Electrical activity was reviewed with band pass filter of 1-70Hz , sensitivity of 7 uV/mm, display speed of 58mm/sec with a 60Hz  notched filter applied as appropriate. EEG data were recorded continuously and digitally stored.  Video monitoring was available and reviewed as appropriate.   Description: EEG showed near continuous generalized predominantly 6 to 8 Hz theta-alpha activity admixed with intermittent generalized 2 to 3 Hz delta slowing hyperventilation and photic stimulation were not performed.      ABNORMALITY -Continuous slow, generalized    IMPRESSION: This study is suggestive of moderate to severe diffuse encephalopathy, nonspecific etiology.  No seizures or epileptiform discharges were seen throughout the recording.     [name] 

## 2022-03-06 ENCOUNTER — Inpatient Hospital Stay (HOSPITAL_COMMUNITY): Payer: 59

## 2022-03-06 DIAGNOSIS — R569 Unspecified convulsions: Secondary | ICD-10-CM | POA: Diagnosis not present

## 2022-03-06 DIAGNOSIS — I4901 Ventricular fibrillation: Secondary | ICD-10-CM | POA: Diagnosis not present

## 2022-03-06 DIAGNOSIS — G931 Anoxic brain damage, not elsewhere classified: Secondary | ICD-10-CM

## 2022-03-06 LAB — PROTIME-INR
INR: 2.8 — ABNORMAL HIGH (ref 0.8–1.2)
Prothrombin Time: 28.9 seconds — ABNORMAL HIGH (ref 11.4–15.2)

## 2022-03-06 LAB — CBC
HCT: 33.9 % — ABNORMAL LOW (ref 39.0–52.0)
Hemoglobin: 10.1 g/dL — ABNORMAL LOW (ref 13.0–17.0)
MCH: 27.2 pg (ref 26.0–34.0)
MCHC: 29.8 g/dL — ABNORMAL LOW (ref 30.0–36.0)
MCV: 91.1 fL (ref 80.0–100.0)
Platelets: 447 10*3/uL — ABNORMAL HIGH (ref 150–400)
RBC: 3.72 MIL/uL — ABNORMAL LOW (ref 4.22–5.81)
RDW: 20.2 % — ABNORMAL HIGH (ref 11.5–15.5)
WBC: 11.7 10*3/uL — ABNORMAL HIGH (ref 4.0–10.5)
nRBC: 0.2 % (ref 0.0–0.2)

## 2022-03-06 LAB — HEPATIC FUNCTION PANEL
ALT: 22 U/L (ref 0–44)
AST: 38 U/L (ref 15–41)
Albumin: 2.3 g/dL — ABNORMAL LOW (ref 3.5–5.0)
Alkaline Phosphatase: 59 U/L (ref 38–126)
Bilirubin, Direct: 0.6 mg/dL — ABNORMAL HIGH (ref 0.0–0.2)
Indirect Bilirubin: 0.7 mg/dL (ref 0.3–0.9)
Total Bilirubin: 1.3 mg/dL — ABNORMAL HIGH (ref 0.3–1.2)
Total Protein: 5.3 g/dL — ABNORMAL LOW (ref 6.5–8.1)

## 2022-03-06 LAB — GLUCOSE, CAPILLARY
Glucose-Capillary: 104 mg/dL — ABNORMAL HIGH (ref 70–99)
Glucose-Capillary: 115 mg/dL — ABNORMAL HIGH (ref 70–99)
Glucose-Capillary: 119 mg/dL — ABNORMAL HIGH (ref 70–99)
Glucose-Capillary: 120 mg/dL — ABNORMAL HIGH (ref 70–99)
Glucose-Capillary: 126 mg/dL — ABNORMAL HIGH (ref 70–99)
Glucose-Capillary: 141 mg/dL — ABNORMAL HIGH (ref 70–99)

## 2022-03-06 LAB — CULTURE, BLOOD (ROUTINE X 2)

## 2022-03-06 LAB — BASIC METABOLIC PANEL
Anion gap: 7 (ref 5–15)
BUN: 21 mg/dL (ref 8–23)
CO2: 24 mmol/L (ref 22–32)
Calcium: 7.9 mg/dL — ABNORMAL LOW (ref 8.9–10.3)
Chloride: 108 mmol/L (ref 98–111)
Creatinine, Ser: 1.58 mg/dL — ABNORMAL HIGH (ref 0.61–1.24)
GFR, Estimated: 44 mL/min — ABNORMAL LOW (ref >60)
Glucose, Bld: 119 mg/dL — ABNORMAL HIGH (ref 70–99)
Potassium: 3.8 mmol/L (ref 3.5–5.1)
Sodium: 139 mmol/L (ref 135–145)

## 2022-03-06 LAB — MAGNESIUM: Magnesium: 1.8 mg/dL (ref 1.7–2.4)

## 2022-03-06 LAB — PHOSPHORUS: Phosphorus: 2.5 mg/dL (ref 2.5–4.6)

## 2022-03-06 MED ORDER — VALPROATE SODIUM 100 MG/ML IV SOLN
500.0000 mg | Freq: Three times a day (TID) | INTRAVENOUS | Status: DC
  Administered 2022-03-07 (×2): 500 mg via INTRAVENOUS
  Filled 2022-03-06 (×4): qty 5

## 2022-03-06 MED ORDER — VALPROATE SODIUM 100 MG/ML IV SOLN
3000.0000 mg | Freq: Once | INTRAVENOUS | Status: AC
  Administered 2022-03-06: 3000 mg via INTRAVENOUS
  Filled 2022-03-06: qty 30

## 2022-03-06 MED ORDER — LORAZEPAM 2 MG/ML IJ SOLN
2.0000 mg | INTRAMUSCULAR | Status: AC
  Administered 2022-03-06: 2 mg via INTRAVENOUS
  Filled 2022-03-06: qty 1

## 2022-03-06 MED ORDER — LORAZEPAM 2 MG/ML IJ SOLN: 1.0000 mg | INTRAMUSCULAR | Status: DC | PRN

## 2022-03-06 MED ORDER — POTASSIUM CHLORIDE 20 MEQ PO PACK
20.0000 meq | PACK | Freq: Once | ORAL | Status: AC
Start: 2022-03-06 — End: 2022-03-06
  Administered 2022-03-06: 20 meq
  Filled 2022-03-06: qty 1

## 2022-03-06 MED ORDER — MAGNESIUM SULFATE 2 GM/50ML IV SOLN
2.0000 g | Freq: Once | INTRAVENOUS | Status: DC
Start: 2022-03-06 — End: 2022-03-06
  Filled 2022-03-06: qty 50

## 2022-03-06 MED ORDER — LORAZEPAM 2 MG/ML IJ SOLN
2.0000 mg | Freq: Once | INTRAMUSCULAR | Status: AC
  Administered 2022-03-06: 2 mg via INTRAVENOUS
  Filled 2022-03-06: qty 1

## 2022-03-06 MED ORDER — SODIUM CHLORIDE 0.9 % IV SOLN
750.0000 mg | Freq: Two times a day (BID) | INTRAVENOUS | Status: DC
  Administered 2022-03-06 – 2022-03-07 (×2): 750 mg via INTRAVENOUS
  Filled 2022-03-06 (×3): qty 7.5

## 2022-03-06 MED ORDER — MAGNESIUM SULFATE 2 GM/50ML IV SOLN
2.0000 g | Freq: Once | INTRAVENOUS | Status: AC
  Administered 2022-03-06: 2 g via INTRAVENOUS
  Filled 2022-03-06: qty 50

## 2022-03-06 MED ORDER — LORAZEPAM 2 MG/ML IJ SOLN
2.0000 mg | INTRAMUSCULAR | Status: AC
  Administered 2022-03-06: 2 mg via INTRAVENOUS
  Filled 2022-03-06 (×2): qty 1

## 2022-03-06 NOTE — Progress Notes (Addendum)
Advanced Heart Failure Rounding Note  PCP-Cardiologist: None   Subjective:    08/29: [place] VF arrest in setting of severe electrolyte disturbances.  Initial EEG negative.  Remains intubated, unresponsive. Concern for seizure activity overnight.  Objective:   Weight Range: 86.1 kg Body mass index is 28.86 kg/m.   Vital Signs:   Temp:  [97.7 F (36.5 C)-101.7 F (38.7 C)] 98.2 F (36.8 C) (09/01 0600) Pulse Rate:  [56-95] 60 (09/01 0600) Resp:  [0-25] 13 (09/01 0600) BP: (91-140)/(53-91) 99/58 (09/01 0600) SpO2:  [81 %-100 %] 100 % (09/01 0600) FiO2 (%):  [40 %] 40 % (09/01 0310) Weight:  [86.1 kg] 86.1 kg (09/01 0500) Last BM Date : 03/06/22  Weight change: Filed Weights   03/04/22 0400 03/05/22 0500 03/06/22 0500  Weight: 82.2 kg 84.6 kg 86.1 kg    Intake/Output:   Intake/Output Summary (Last 24 hours) at 03/06/2022 0814 Last data filed at 03/06/2022 0600 Gross per 24 hour  Intake 2400.99 ml  Output 1270 ml  Net 1130.99 ml      Physical Exam    General:  Intubated. EEG underway HEENT: ETT Neck: supple. no JVD. Carotids 2+ bilat; no bruits. No lymphadenopathy or thryomegaly appreciated. Cor: PMI nondisplaced. Irregular rate & rhythm. No rubs, gallops or murmurs. Lungs: clear Abdomen: soft, nontender, nondistended. No hepatosplenomegaly. No bruits or masses. Good bowel sounds. Extremities: no cyanosis, clubbing, rash, edema Neuro: intubated unresponsive.   EKG- A fib 60-70s with PVCs    Labs    CBC Recent Labs    03/05/22 0544 03/06/22 0244  WBC 9.5 11.7*  HGB 11.9* 10.1*  HCT 38.8* 33.9*  MCV 88.2 91.1  PLT 512* 447*   Basic Metabolic Panel Recent Labs    95/62/13 0544 03/05/22 1657 03/06/22 0244  NA 140  --  139  K 3.8  --  3.8  CL 107  --  108  CO2 23  --  24  GLUCOSE 95  --  119*  BUN 19  --  21  CREATININE 1.87*  --  1.58*  CALCIUM 8.4*  --  7.9*  MG 2.1 2.0 1.8  PHOS 3.0 2.5 2.5   Liver Function Tests Recent Labs     03/05/22 0544 03/06/22 0244  AST 47* 38  ALT 32 22  ALKPHOS 61 59  BILITOT 1.2 1.3*  PROT 5.9* 5.3*  ALBUMIN 2.6* 2.3*   No results for input(s): "LIPASE", "AMYLASE" in the last 72 hours. Cardiac Enzymes No results for input(s): "CKTOTAL", "CKMB", "CKMBINDEX", "TROPONINI" in the last 72 hours.  BNP: BNP (last 3 results) Recent Labs    12/19/21 1129 12/25/21 1401 02/22/22 1814  BNP 238.8* 366.8* 416.4*    ProBNP (last 3 results) No results for input(s): "PROBNP" in the last 8760 hours.   D-Dimer No results for input(s): "DDIMER" in the last 72 hours. Hemoglobin A1C No results for input(s): "HGBA1C" in the last 72 hours. Fasting Lipid Panel Recent Labs    03/04/22 0534  TRIG 115   Thyroid Function Tests No results for input(s): "TSH", "T4TOTAL", "T3FREE", "THYROIDAB" in the last 72 hours.  Invalid input(s): "FREET3"  Other results:   Imaging    EEG adult  Result Date: 03/05/2022 Charlsie Quest, MD     03/05/2022 12:43 PM Patient Name: [name] MRN: [id number] Epilepsy Attending: Charlsie Quest Referring Physician/Provider: Lorin Glass, MD Date: 03/05/2022 Duration: 23.32 mins  Patient history: 76yo M s/p cardiac arrest. EEG to evaluate  for seizure  Level of alertness:  comatose  AEDs during EEG study: None  Technical aspects: This EEG study was done with scalp electrodes positioned according to the 10-20 International system of electrode placement. Electrical activity was reviewed with band pass filter of 1-70Hz , sensitivity of 7 uV/mm, display speed of 8mm/sec with a 60Hz  notched filter applied as appropriate. EEG data were recorded continuously and digitally stored.  Video monitoring was available and reviewed as appropriate.  Description: EEG showed near continuous generalized predominantly 6 to 8 Hz theta Priyanka activity admixed with intermittent generalized 2 to 3 Hz delta slowing hyperventilation and photic stimulation were not performed.     ABNORMALITY -Continuous slow, generalized   IMPRESSION: This study is suggestive of moderate to severe diffuse encephalopathy, nonspecific etiology.  No seizures or epileptiform discharges were seen throughout the recording.   Priyanka      Medications:     Scheduled Medications:  Chlorhexidine Gluconate Cloth  6 each Topical Q0600   docusate  100 mg Per Tube BID   mouth rinse  15 mL Mouth Rinse Q2H   pantoprazole  40 mg Per Tube Daily   polyethylene glycol  17 g Per Tube Daily    Infusions:  sodium chloride Stopped (03/04/22 1215)   feeding supplement (VITAL AF 1.2 CAL) 65 mL/hr at 03/06/22 0600   norepinephrine (LEVOPHED) Adult infusion     propofol (DIPRIVAN) infusion 40 mcg/kg/min (03/06/22 0654)    PRN Medications: acetaminophen **OR** acetaminophen (TYLENOL) oral liquid 160 mg/5 mL **OR** acetaminophen, fentaNYL (SUBLIMAZE) injection, fentaNYL (SUBLIMAZE) injection, LORazepam, ondansetron (ZOFRAN) IV, mouth rinse   Assessment/Plan   VF arrest: - in setting of severe electrolyte disturbances - HS troponin up to 2800 after multiple rounds of CPR. No CAD on last LHC in 2009 - Echo 1/21 EF 35-40% RV ok. mechanical MVR ok - Echo 08/23 EF 25-30%, inferior and inferolateral WMA, RV moderately reduced, severe BAE, mechanical MVR okay - s/p CRT-P in past  - No further VF - Keep K > 4.0 Mg > 2.0 - will hold off on amiodarone - Concern for severe anoxic brain injury based on exam. No plans for ICD upgrade at this time - EP interrogated device. Total down time to achieving ROSC 36 min.   2. Chronic systolic CHF: - NICM dating back to 2009 - Echo 1/21 EF 35-40% RV ok. mechanical MVR ok - Echo 04/23: EF 35-40%, RV moderately reduced, mechanical MV okay - Echo 08/23: EF 25-30%, inferior and inferolateral WMA, RV moderately reduced, severe BAE, mechanical MVR okay - Multiple admits for ADHF -- Has been noncompliant with clinic f/u - If he recovers will adjust GDMT as  tolerated - Volume status stable.    3. CKD IIIb: - Scr post arrest 1.57>1.74>1.97>1.87>1.58 - Baseline Scr appears 1.5-1.8   4. Hypokalemia/hypomagnesemia: - supp K  - Supp Mag mag ok.    5. Anoxic brain injury: - suspect severe - initial EEG negative -Unresponsive   6. S/p Mechanical MVR - MV okay on echo this admit - supratherapeutic INR on admit, start heparin when INR < 2.5 - INR 2.8  Start heparin when INR < 2.5.     7. AF, chronic - Rate controlled. - INR 2.8    Length of Stay: 3  Amy Clegg, NP  03/06/2022, 8:14 AM  Advanced Heart Failure Team Pager [phone] (M-F; 7a - 5p)  Please contact [facility] for night-coverage after hours (5p -7a ) and weekends on [email or web address]  Agree  with above.   Remains intubated. Sedated. Unresponsive for me.   Had some apparent seizure activity overnight concern for anoxic brain injury with status. Neuro now following.   Hemodynamically stable. Rhythm stable   Intubated sedated  HEENT: +ETT Neck: supple. JVP 7-8  Carotids 2+ bilat; no bruits. No lymphadenopathy or thryomegaly appreciated. Cor: PMI nondisplaced. Irregular rate & rhythm. Mech s1 Lungs: clear Abdomen: soft, nontender, nondistended. No hepatosplenomegaly. No bruits or masses. Good bowel sounds. Extremities: no cyanosis, clubbing, rash, edema Neuro: unresponsive on vent.   Rhythm and hemodynamics stable. Appears to have severe anoxic brain injury. Neurology now involved (thanks). INR 2.8. start heparin for MVR when INR < = 2.5  CRITICAL CARE Performed by: [name]  Total critical care time: 35 minutes  Critical care time was exclusive of separately billable procedures and treating other patients.  Critical care was necessary to treat or prevent imminent or life-threatening deterioration.  Critical care was time spent personally by me (independent of midlevel providers or residents) on the following activities: development of treatment plan with  patient and/or surrogate as well as nursing, discussions with consultants, evaluation of patient's response to treatment, examination of patient, obtaining history from patient or surrogate, ordering and performing treatments and interventions, ordering and review of laboratory studies, ordering and review of radiographic studies, pulse oximetry and re-evaluation of patient's condition.  [name], MD  9:05 AM

## 2022-03-06 NOTE — Consult Note (Addendum)
Subjective: On LTM EEG yesterday it was noted that he went into status epilepticus.    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  This is not the clearly dismal prognosis associated myoclonic status, though certainly is still very concerning for significant cerebral injury.    He was treated with Keppra and propofol.  His pattern improved considerably, now with very centrally predominant 6 Hz activity which does have intermittent periods of cessation.  This would not typically be an ictal pattern, but when looking at the overall evolution of the patterns, I think I would favor getting him to a burst suppression pattern overnight as I do have some concerns.  Recommendations: 1) Keppra 750 twice daily(renally dosed) 2) propofol titrated to burst suppression 3) Depacon 500 3 times daily 4) repeat CT head when possible 5) neurology will continue to follow  This patient is critically ill and at significant risk of neurological worsening, death and care requires constant monitoring of vital signs, hemodynamics,respiratory and cardiac monitoring, neurological assessment, discussion with family, other specialists and medical decision making of high complexity. I spent 50 minutes of neurocritical care time  in the care of  this patient. This was time spent independent of any time provided by nurse practitioner or PA.  Ritta Slot, MD [facility] [phone]  If 7pm- 7am, please page neurology on call as listed in AMION. 03/06/2022  2:50 PM

## 2022-03-06 NOTE — Procedures (Signed)
Patient Name: [name]  MRN: [id number]  Epilepsy Attending: Charlsie Quest  Referring Physician/Provider: Elmer Picker, NP Duration: 03/05/2022 1504 to 03/06/2022 1504   Patient history: 76yo M s/p cardiac arrest. EEG to evaluate for seizure   Level of alertness:  comatose   AEDs during EEG study: propofol, LEV, VPA   Technical aspects: This EEG study was done with scalp electrodes positioned according to the 10-20 International system of electrode placement. Electrical activity was reviewed with band pass filter of 1-70Hz , sensitivity of 7 uV/mm, display speed of 3mm/sec with a 60Hz  notched filter applied as appropriate. EEG data were recorded continuously and digitally stored.  Video monitoring was available and reviewed as appropriate.   Description: At the beginning of the study, EEG showed generalized and maximal bilateral frontocentral spikes and polyspikes at 3.5 to 4 Hz.  At times, left arm shaking was noted but no clinical signs were seen during rest of the time  This EEG pattern is most consistent with electrographic status epilepticus.  As medications were adjusted, EEG improved after around 2000 on 03/05/2022. EEG then showed continuous generalized sharply contoured 3 to 6 Hz theta-delta slowing admixed with 12 to 14 Hz generalized beta activity.  After around 0745 on 03/06/2022, 2-6 seconds of generalized EEG attenuation were also noted.  Hyperventilation and photic stimulation were not performed.     Patient event button was pressed on 03/06/2022 at 0610 for posturing and trembling and at 0743 for left arm jerking.  Concomitant EEG before, during and after the event did not show any definite EEG changes suggest seizure.  However, focal motor seizures may not be seen on scalp EEG.   ABNORMALITY -Electrographic status epilepticus, generalized -Continuous slow, generalized    IMPRESSION: This study was initially suggestive of electrographic status epilepticus with generalized  onset.  As medications were adjusted, EEG improved after around 2000 on 03/05/2022.  Subsequently, EEG was suggestive of severe to profound diffuse encephalopathy, nonspecific etiology.    Patient event button was pressed as described above with no definite EEG change.  However focal motor seizures may not be seen on scalp EEG.  Clinical correlation is recommended.  Another differential includes stimulation induced myoclonus  [name] 03/07/2022

## 2022-03-06 NOTE — Progress Notes (Signed)
An USGPIV (ultrasound guided PIV) has been placed for short-term vasopressor infusion. A correctly placed ivWatch must be used when administering Vasopressors. Should this treatment be needed beyond 72 hours, central line access should be obtained.  It will be the responsibility of the bedside nurse to follow best practice to prevent extravasations.   ?

## 2022-03-06 NOTE — TOC Initial Note (Signed)
Transition of Care [facility]) - Initial/Assessment Note    Patient Details  Name: [name] MRN: [id number] Date of Birth: [birth year]  Transition of Care [facility]) CM/SW Contact:    Elliot Cousin, RN Phone Number: [phone] 03/06/2022, 6:11 PM  Clinical Narrative:                 HF TOC CM spoke to dtr, Sheria at bedside. Pt lives in home with dtr. States was driving his Moped PTA. Will continue to follow up for dc needs.   Expected Discharge Plan: IP Rehab Facility Barriers to Discharge: Continued Medical Work up   Patient Goals and CMS Choice Patient states their goals for this hospitalization and ongoing recovery are:: wants pt to recover      Expected Discharge Plan and Services Expected Discharge Plan: IP Rehab Facility   Discharge Planning Services: CM Consult   Living arrangements for the past 2 months: Single Family Home   Prior Living Arrangements/Services Living arrangements for the past 2 months: Single Family Home Lives with:: Adult Children Patient language and need for interpreter reviewed:: Yes Do you feel safe going back to the place where you live?: Yes      Need for Family Participation in Patient Care: Yes (Comment) Care giver support system in place?: Yes (comment)   Criminal Activity/Legal Involvement Pertinent to Current Situation/Hospitalization: No - Comment as needed  Activities of Daily Living      Permission Sought/Granted Permission sought to share information with : Case Manager, Family Supports, PCP    Share Information with NAME: Yehuda Mao     Permission granted to share info w Relationship: daughter  Permission granted to share info w Contact Information: [phone]  Emotional Assessment   Attitude/Demeanor/Rapport: Intubated (Following Commands or Not Following Commands)       Psych Involvement: No (comment)  Admission diagnosis:  Ventricular fibrillation (HCC) [I49.01] Cardiac arrest [facility]) [I46.9] Patient  Active Problem List   Diagnosis Date Noted   Acute respiratory failure with hypoxia (HCC)    AKI (acute kidney injury) (HCC)    Encephalopathy acute    Ventricular fibrillation (HCC) 02/09/2022   Acute gout of right knee 02/23/2022   Septic arthritis of knee, right (HCC) 02/22/2022   Sundowning    Accident due to mechanical fall without injury    Hypokalemia 12/01/2021   Right knee pain 11/10/2021   Sleep disturbances 11/05/2021   Atrial fibrillation (HCC) 10/24/2021   Acute exacerbation of CHF (congestive heart failure) (HCC) 10/24/2021   Pre-diabetes 09/23/2021   TIA (transient ischemic attack) 09/15/2021   GI bleed 06/27/2021   Right inguinal hernia 04/21/2021   Chronic cough 02/25/2021   Lower leg pain 02/12/2021   Stage 3b chronic kidney disease (CKD) (HCC) 03/26/2016   Dementia (HCC) 12/24/2015   H/O mitral valve replacement with mechanical valve 11/13/2015   Benign prostatic hyperplasia with nocturia 11/13/2015   Healthcare maintenance 11/13/2015   History of arteriovenous malformation (AVM) 03/28/2014   Chronic biventricular, systolic heart failure (HCC) 06/02/2011   Essential hypertension 09/09/2008   History of atrial flutter 03/13/2008   PCP:  Gust Rung, DO Pharmacy:   [phone] - [place], [place] - [address] AT [facility] OF [address] [place] [address] Phone: [phone] Fax: [phone]     Social Determinants of Health (SDOH) Interventions    Readmission Risk Interventions     No data to display

## 2022-03-06 NOTE — Progress Notes (Signed)
Propofol titrated up to 34mcg/kg/min per Amada Jupiter, MD. He request that if propofol needs to be titrated down for any reason, to discuss with him first.

## 2022-03-06 NOTE — Progress Notes (Signed)
NAME:  [name], MRN:  [id number], DOB:  [birth year], LOS: 3 ADMISSION DATE:  03/05/2022, CONSULTATION DATE:  02/27/2022 REFERRING MD:  Rubin Payor - RD [facility], CHIEF COMPLAINT:  VF arrest    History of Present Illness:  76 year old man who suffered a witness VF cardiac arrest requiring defibrillation x7. Intubated in the field. Received amiodarone bolus.   Recent discharge from IMTS who follows him closely following gout flair treated with a prednisone taper. No change in cardiac medications at that time, but hypokalemia and hypomagnesemia were both treated at that time and patient was restarted on diuretics at discharge because of crackles on examination.   Pertinent  Medical History   Past Medical History:  Diagnosis Date   Acute GI bleeding 06/27/2021   Anemia 01/29/2016   Atrial fibrillation (HCC)    post op.  s/p dc-cv 04/2008. 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  a- Echo 07/2008 Ef 55% mild peroprosthetic MVR with High transmitral gradient (mean 14).  b- normal coronaries by cath 12/2007   BPH (benign prostatic hyperplasia)    Cardiac resynchronization therapy pacemaker (CRT-P) in place 06/22/2011   CHF (congestive heart failure) (HCC)    EF 35-40 % 2011 due to valvular disease and diastolic dysfunction   Chronic back pain    CKD (chronic kidney disease), stage III (HCC)    COVID-19 virus infection 07/24/2020   Tested positive on July 16, 2020   Dysphagia    with normal barium swallow 09/2008   Endocarditis    GERD (gastroesophageal reflux disease)    Heavy alcohol use    history   History of atrial flutter 03/13/2008   History of atrial flutter - resolved.    History of GI bleed    Hypertension    IV drug abuse (HCC)    history of   Lower GI bleed 01/2016   Memory impairment 12/24/2015   Pacemaker 2010     Significant Hospital Events: Including procedures, antibiotic start and stop dates in addition to other pertinent events   8/29 - admitted following arrest.  Echo EF 25-30% 8/30 weaning propofol to assess mental status  8/31 acute events overnight, remains sedated on ventilator 9/1 Continuous EEG applied overnight and revealed concerns for status epilepticus for which propofol drip was increased. Seizure activity again seen this am and propofol again increased   Interim History / Subjective:  Deeply sedated on ventilator this a.m. Intermittent seizure activity seen on assessment this am for which propofol drip was increased  Objective   Blood pressure (!) 99/58, pulse 60, temperature 98.2 F (36.8 C), resp. rate 13, weight 86.1 kg, SpO2 100 %.    Vent Mode: PRVC FiO2 (%):  [40 %] 40 % Set Rate:  [18 bmp-20 bmp] 20 bmp Vt Set:  [550 mL] 550 mL PEEP:  [5 cmH20] 5 cmH20 Pressure Support:  [10 cmH20] 10 cmH20 Plateau Pressure:  [15 cmH20-18 cmH20] 18 cmH20   Intake/Output Summary (Last 24 hours) at 03/06/2022 0717 Last data filed at 03/06/2022 0600 Gross per 24 hour  Intake 2500.99 ml  Output 1370 ml  Net 1130.99 ml    Filed Weights   03/04/22 0400 03/05/22 0500 03/06/22 0500  Weight: 82.2 kg 84.6 kg 86.1 kg    Examination: General: Acute on chronic ill-appearing elderly  gentleman lying in bed on mechanical ventilation in no acute distress HEENT: ETT, MM pink/moist, PERRL,  Neuro: Deeply sedated on ventilator, continuous EEG in place CV: s1s2 regular rate and rhythm, no murmur, rubs, or gallops,  PULM: Clear to auscultation bilaterally, no increased work of breathing, no added breath sounds GI: soft, bowel sounds active in all 4 quadrants, non-tender, non-distended, tolerating TF Extremities: warm/dry, no edema  Skin: no rashes or lesions  Resolved problems  Mildly elevated LFTs likely in the setting of shock liver  Assessment & Plan:  VF cardiac arrest requiring  defibrillation in the field in the setting of severe electrolyte disturbances -Witness VF cardiac arrest requiring defibrillation x7, estimated total downtime 36 minutes Ventricular fibrillation in the setting of hypokalemia and hypomagnesemia  -Electrolytes supplemented at outpatient IMTS appointment  History of nonischemic cardiomyopathy with ejection fraction 35-40% -New echo this admit worsened to EF 25-30%. Atrial fibrillation with slow ventricular response (on warfarin) S/p mechanical MVR. Elevated high-sensitivity troponin P: Goal of normothermia Continue to follow closely for seizure activity Heart failure following Echo as above Continuous telemetry Hemodynamic control, given increased need for sedation will likely need peripheral pressors soon Map goal greater than 65 Optimize electrolytes K greater than 4, mag greater than 2 GDMT as able  Acute Hypoxic Respiratory Failure  -In the setting of VF cardiac arrest  P: Continue ventilator support with lung protective strategies  Wean PEEP and FiO2 for sats greater than 90%. Head of bed elevated 30 degrees. Plateau pressures less than 30 cm H20.  Follow intermittent chest x-ray and ABG.   SAT/SBT as tolerated, mentation preclude extubation  Ensure adequate pulmonary hygiene  Follow cultures  VAP bundle in place  PAD protocol  Anoxic encephalopathy requiring mechanical ventilation for airway protection in the setting of prolonged cardiac arrest Status epilepticus -Initial EEG negative but recurrent continuous EEG placed 8/31 with evidence of status P: Primary management per neurology  Maintain neuro protective measures; goal for eurothermia, euglycemia, eunatermia, normoxia, and PCO2 goal of 35-40 Nutrition and bowel regiment  Seizure precautions  AEDs per neurology  Aspirations precautions  Continuous EEG Given presence of incompatible pacemaker unable to obtain MRI we will discuss with neurology possibility of  repeating head CT  Acute Kidney Injury superimposed on CKD stage IIIb -Baseline creatinine appears to be between 1.5-1.8 P: Follow renal function  Monitor urine output Trend Bmet Avoid nephrotoxins Ensure adequate renal perfusion   Hypoalbuminemia P: Continue tube feeds Protein supplementation  Best Practice (right click and "Reselect all SmartList Selections" daily)  Diet/type: tubefeeds DVT prophylaxis: prophylactic heparin  GI prophylaxis: PPI Lines: N/A Foley:  Yes, and it is still needed Code Status:  full code Last date of multidisciplinary goals of care discussion continue daily discussions with family regarding goals of care  Critical care: 40  min   Miyu Fenderson D. Tiburcio Pea, NP-C [facility] Personal contact information can be found on Amion  03/06/2022, 7:17 AM

## 2022-03-06 NOTE — Progress Notes (Signed)
[name] 0600) Temp Source: Bladder (08/31 2000) BP: 99/58 (09/01 0600) Pulse Rate: 60 (09/01 0600)  Labs: Recent Labs    02/12/2022 1226 02/03/2022 1537 02/23/2022 1805 02/21/2022 2208 03/04/22 0534 03/05/22 0544 03/06/22 0244  HGB  --   --   --   --  12.3* 11.9* 10.1*  HCT  --   --   --   --  41.0 38.8* 33.9*  PLT  --   --   --   --  530* 512* 447*  LABPROT  --   --   --   --  30.2* 31.4* 28.9*  INR  --   --   --   --  2.9* 3.1* 2.8*  CREATININE 1.50*  --   --    < > 1.97* 1.87* 1.58*  TROPONINIHS 2,159* 2,801* 2,452*  --   --   --   --    < > = values in this interval not displayed.    Estimated Creatinine Clearance: 41.1 mL/min (A) (by C-G formula based on SCr of 1.58 mg/dL (H)).  Medical History: Past Medical History:  Diagnosis Date   Acute GI bleeding 06/27/2021   Anemia 01/29/2016   Atrial fibrillation (HCC)    post op.  s/p dc-cv 04/2008. 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  a- Echo 07/2008 Ef 55% mild peroprosthetic MVR with High transmitral gradient (mean 14).  b- normal coronaries by cath 12/2007   BPH (benign prostatic hyperplasia)    Cardiac resynchronization therapy pacemaker (CRT-P) in place 06/22/2011   CHF (congestive heart failure) (HCC)    EF 35-40 % 2011 due to valvular disease and diastolic dysfunction   Chronic back pain    CKD (chronic kidney disease), stage III (HCC)    COVID-19 virus infection 07/24/2020   Tested positive on July 16, 2020   Dysphagia    with normal barium swallow 09/2008    Endocarditis    GERD (gastroesophageal reflux disease)    Heavy alcohol use    history   History of atrial flutter 03/13/2008   History of atrial flutter - resolved.    History of GI bleed    Hypertension    IV drug abuse (HCC)    history of   Lower GI bleed 01/2016   Memory impairment 12/24/2015   Pacemaker 2010   Medications:  Infusions:   sodium chloride Stopped (03/04/22 1215)   feeding supplement (VITAL AF 1.2 CAL) 65 mL/hr at 03/06/22 0600   norepinephrine (LEVOPHED) Adult infusion     propofol (DIPRIVAN) infusion 40 mcg/kg/min (03/06/22 0600)   Assessment: ES is a 76 year old male admitted for witnessed VF cardiac arrest requiring defibrillation x7. Patient was on warfarin prior to admission. Prior to admission warfarin dose was 5 mg Saturday and Sunday, 2.5 mg Monday, Tuesday, Wednesday, Thursday, Friday. Last warfarin dose unknown. INR today is therapeutic at 2.8. Hgb 11.7 stable, platelets 447. Pharmacy consulted to start heparin when INR <2.5 for mechanical MVR and Afib.  Goal of Therapy:  Heparin level 0.3-0.7 units/ml Monitor platelets by Richard protocol: Yes   Plan:  Start heparin when INR<2.5. Monitor daily CBC and INR. Monitor for signs/symptoms of bleeding.  Thank you for allowing pharmacy to participate in this patient's care.  Enos Fling, PharmD PGY2 Pharmacy Resident 03/06/2022 6:53 AM Check [email or web address] for unit specific pharmacy number

## 2022-03-06 DEATH — deceased

## 2022-03-07 ENCOUNTER — Inpatient Hospital Stay (HOSPITAL_COMMUNITY): Payer: 59

## 2022-03-07 ENCOUNTER — Inpatient Hospital Stay: Payer: Self-pay

## 2022-03-07 DIAGNOSIS — G9340 Encephalopathy, unspecified: Secondary | ICD-10-CM | POA: Diagnosis not present

## 2022-03-07 DIAGNOSIS — R569 Unspecified convulsions: Secondary | ICD-10-CM | POA: Diagnosis not present

## 2022-03-07 DIAGNOSIS — I4901 Ventricular fibrillation: Secondary | ICD-10-CM | POA: Diagnosis not present

## 2022-03-07 LAB — BASIC METABOLIC PANEL
Anion gap: 6 (ref 5–15)
BUN: 22 mg/dL (ref 8–23)
CO2: 23 mmol/L (ref 22–32)
Calcium: 8.1 mg/dL — ABNORMAL LOW (ref 8.9–10.3)
Chloride: 112 mmol/L — ABNORMAL HIGH (ref 98–111)
Creatinine, Ser: 1.35 mg/dL — ABNORMAL HIGH (ref 0.61–1.24)
GFR, Estimated: 54 mL/min — ABNORMAL LOW (ref >60)
Glucose, Bld: 113 mg/dL — ABNORMAL HIGH (ref 70–99)
Potassium: 3.9 mmol/L (ref 3.5–5.1)
Sodium: 141 mmol/L (ref 135–145)

## 2022-03-07 LAB — CBC
HCT: 31.4 % — ABNORMAL LOW (ref 39.0–52.0)
Hemoglobin: 9.8 g/dL — ABNORMAL LOW (ref 13.0–17.0)
MCH: 27.2 pg (ref 26.0–34.0)
MCHC: 31.2 g/dL (ref 30.0–36.0)
MCV: 87.2 fL (ref 80.0–100.0)
Platelets: 409 10*3/uL — ABNORMAL HIGH (ref 150–400)
RBC: 3.6 MIL/uL — ABNORMAL LOW (ref 4.22–5.81)
RDW: 20.5 % — ABNORMAL HIGH (ref 11.5–15.5)
WBC: 8.6 10*3/uL (ref 4.0–10.5)
nRBC: 0 % (ref 0.0–0.2)

## 2022-03-07 LAB — TRIGLYCERIDES: Triglycerides: 68 mg/dL (ref <150)

## 2022-03-07 LAB — HEPATIC FUNCTION PANEL
ALT: 21 U/L (ref 0–44)
AST: 33 U/L (ref 15–41)
Albumin: 2.2 g/dL — ABNORMAL LOW (ref 3.5–5.0)
Alkaline Phosphatase: 61 U/L (ref 38–126)
Bilirubin, Direct: 0.5 mg/dL — ABNORMAL HIGH (ref 0.0–0.2)
Indirect Bilirubin: 0.5 mg/dL (ref 0.3–0.9)
Total Bilirubin: 1 mg/dL (ref 0.3–1.2)
Total Protein: 5 g/dL — ABNORMAL LOW (ref 6.5–8.1)

## 2022-03-07 LAB — GLUCOSE, CAPILLARY
Glucose-Capillary: 105 mg/dL — ABNORMAL HIGH (ref 70–99)
Glucose-Capillary: 95 mg/dL (ref 70–99)
Glucose-Capillary: 81 mg/dL (ref 70–99)
Glucose-Capillary: 122 mg/dL — ABNORMAL HIGH (ref 70–99)
Glucose-Capillary: 109 mg/dL — ABNORMAL HIGH (ref 70–99)

## 2022-03-07 LAB — PROTIME-INR
INR: 2.4 — ABNORMAL HIGH (ref 0.8–1.2)
Prothrombin Time: 26.3 seconds — ABNORMAL HIGH (ref 11.4–15.2)

## 2022-03-07 LAB — PHOSPHORUS: Phosphorus: 3.3 mg/dL (ref 2.5–4.6)

## 2022-03-07 LAB — HEPARIN LEVEL (UNFRACTIONATED): Heparin Unfractionated: <0.1 IU/mL — ABNORMAL LOW (ref 0.30–0.70)

## 2022-03-07 LAB — MAGNESIUM: Magnesium: 2.1 mg/dL (ref 1.7–2.4)

## 2022-03-07 LAB — VALPROIC ACID LEVEL: Valproic Acid Lvl: 36 ug/mL — ABNORMAL LOW (ref 50.0–100.0)

## 2022-03-07 MED ORDER — FUROSEMIDE 10 MG/ML IJ SOLN
40.0000 mg | Freq: Once | INTRAMUSCULAR | Status: AC
  Administered 2022-03-07: 40 mg via INTRAVENOUS
  Filled 2022-03-07: qty 4

## 2022-03-07 MED ORDER — HEPARIN (PORCINE) 25000 UT/250ML-% IV SOLN
1600.0000 [IU]/h | INTRAVENOUS | Status: DC
Start: 2022-03-07 — End: 2022-03-08
  Administered 2022-03-07: 1200 [IU]/h via INTRAVENOUS
  Administered 2022-03-08: 1450 [IU]/h via INTRAVENOUS
  Filled 2022-03-07 (×2): qty 250

## 2022-03-07 MED ORDER — VALPROATE SODIUM 100 MG/ML IV SOLN
1000.0000 mg | Freq: Once | INTRAVENOUS | Status: AC
  Administered 2022-03-07: 1000 mg via INTRAVENOUS
  Filled 2022-03-07: qty 10

## 2022-03-07 MED ORDER — SODIUM CHLORIDE 0.9 % IV SOLN
150.0000 mg | Freq: Two times a day (BID) | INTRAVENOUS | Status: DC
  Administered 2022-03-08 (×2): 150 mg via INTRAVENOUS
  Filled 2022-03-07 (×3): qty 15

## 2022-03-07 MED ORDER — SODIUM CHLORIDE 0.9% FLUSH: 10.0000 mL | INTRAVENOUS | Status: DC | PRN

## 2022-03-07 MED ORDER — LEVETIRACETAM IN NACL 1000 MG/100ML IV SOLN: 1000.0000 mg | Freq: Once | INTRAVENOUS | Status: DC

## 2022-03-07 MED ORDER — SODIUM CHLORIDE 0.9% FLUSH
10.0000 mL | Freq: Two times a day (BID) | INTRAVENOUS | Status: DC
  Administered 2022-03-07: 10 mL

## 2022-03-07 MED ORDER — LEVETIRACETAM IN NACL 1000 MG/100ML IV SOLN
1000.0000 mg | Freq: Two times a day (BID) | INTRAVENOUS | Status: DC
  Administered 2022-03-07 – 2022-03-08 (×3): 1000 mg via INTRAVENOUS
  Filled 2022-03-07 (×3): qty 100

## 2022-03-07 MED ORDER — VALPROATE SODIUM 100 MG/ML IV SOLN
750.0000 mg | Freq: Three times a day (TID) | INTRAVENOUS | Status: DC
Start: 2022-03-07 — End: 2022-03-08
  Administered 2022-03-07 – 2022-03-08 (×4): 750 mg via INTRAVENOUS
  Filled 2022-03-07 (×8): qty 7.5

## 2022-03-07 MED ORDER — SODIUM CHLORIDE 0.9 % IV SOLN
400.0000 mg | Freq: Once | INTRAVENOUS | Status: AC
  Administered 2022-03-07: 400 mg via INTRAVENOUS
  Filled 2022-03-07: qty 40

## 2022-03-07 MED ORDER — LEVETIRACETAM IN NACL 500 MG/100ML IV SOLN
500.0000 mg | Freq: Once | INTRAVENOUS | Status: AC
  Administered 2022-03-07: 500 mg via INTRAVENOUS
  Filled 2022-03-07: qty 100

## 2022-03-07 MED ORDER — POTASSIUM CHLORIDE 20 MEQ PO PACK
40.0000 meq | PACK | Freq: Once | ORAL | Status: AC
  Administered 2022-03-07: 40 meq
  Filled 2022-03-07: qty 2

## 2022-03-07 NOTE — Progress Notes (Signed)
ANTICOAGULATION CONSULT NOTE   Pharmacy Consult for heparin  Indication:  mechanical MVR  and atrial fibrillation  No Known Allergies  Patient Measurements: Weight: 83.7 kg (184 lb 8.4 oz) IBW: 68.4 kg   Vital Signs: Temp: 97.3 F (36.3 C) (09/02 1845) Temp Source: Bladder (09/02 1215) BP: 94/59 (09/02 1845) Pulse Rate: 63 (09/02 1845)  Labs: Recent Labs    03/05/22 0544 03/06/22 0244 03/07/22 0604 03/07/22 1636  HGB 11.9* 10.1* 9.8*  --   HCT 38.8* 33.9* 31.4*  --   PLT 512* 447* 409*  --   LABPROT 31.4* 28.9* 26.3*  --   INR 3.1* 2.8* 2.4*  --   HEPARINUNFRC  --   --   --  <0.10*  CREATININE 1.87* 1.58* 1.35*  --     Estimated Creatinine Clearance: 47.5 mL/min (A) (by C-G formula based on SCr of 1.35 mg/dL (H)).  Medical History: Past Medical History:  Diagnosis Date   Acute GI bleeding 06/27/2021   Anemia 01/29/2016   Atrial fibrillation (HCC)    post op.  s/p dc-cv 04/2008. 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  a- Echo 07/2008 Ef 55% mild peroprosthetic MVR with High transmitral gradient (mean 14).  b- normal coronaries by cath 12/2007   BPH (benign prostatic hyperplasia)    Cardiac resynchronization therapy pacemaker (CRT-P) in place 06/22/2011   CHF (congestive heart failure) (HCC)    EF 35-40 % 2011 due to valvular disease and diastolic dysfunction   Chronic back pain    CKD (chronic kidney disease), stage III (HCC)    COVID-19 virus infection 07/24/2020   Tested positive on July 16, 2020   Dysphagia    with normal barium swallow 09/2008   Endocarditis    GERD (gastroesophageal reflux disease)    Heavy alcohol use    history   History of atrial flutter 03/13/2008   History of atrial flutter - resolved.    History of GI bleed    Hypertension    IV drug abuse (HCC)    history of   Lower GI bleed  01/2016   Memory impairment 12/24/2015   Pacemaker 2010   Medications:  Infusions:   sodium chloride Stopped (03/06/22 1519)   feeding supplement (VITAL AF 1.2 CAL) 65 mL/hr at 03/07/22 1800   heparin 1,200 Units/hr (03/07/22 1800)   [START ON 03/19/2022] lacosamide (VIMPAT) IV     levETIRAcetam Stopped (03/07/22 1255)   norepinephrine (LEVOPHED) Adult infusion 3 mcg/min (03/07/22 1800)   propofol (DIPRIVAN) infusion 80 mcg/kg/min (03/07/22 1857)   valproate sodium Stopped (03/07/22 1630)   Assessment: ES is a 76 year old male admitted for witnessed VF cardiac arrest requiring defibrillation x7. Patient was on warfarin prior to admission. Prior to admission warfarin dose was 5 mg Saturday and Sunday, 2.5 mg Monday, Tuesday, Wednesday, Thursday, Friday. Last warfarin dose unknown. Pharmacy consulted to start heparin when INR <2.5 for mechanical MVR and Afib. I -heparin level undetectable on 1200 units/hr  Goal of Therapy:  Heparin level 0.3-0.7 units/ml Monitor platelets by anticoagulation protocol: Yes   Plan:  -Increase heparin to 1450 units/hr -Heparin level in 8 hours and daily wth CBC daily  Harland German, PharmD Clinical Pharmacist **Pharmacist phone directory can now be found on [email or web address] ([email or web address]).  Listed under [facility].

## 2022-03-07 NOTE — Progress Notes (Signed)
ANTICOAGULATION CONSULT NOTE - Initial Consult  Pharmacy Consult for heparin  Indication:  mechanical MVR  and atrial fibrillation  No Known Allergies  Patient Measurements: Weight: 83.7 kg (184 lb 8.4 oz) IBW: 68.4 kg Heparin Dosing Weight: 84.6 kg (TBW)   Vital Signs: Temp: 98.6 F (37 C) (09/02 0700) Temp Source: Bladder (09/02 0000) BP: 98/58 (09/02 0700) Pulse Rate: 76 (09/02 0700)  Labs: Recent Labs    03/05/22 0544 03/06/22 0244 03/07/22 0604  HGB 11.9* 10.1* 9.8*  HCT 38.8* 33.9* 31.4*  PLT 512* 447* 409*  LABPROT 31.4* 28.9* 26.3*  INR 3.1* 2.8* 2.4*  CREATININE 1.87* 1.58*  --     Estimated Creatinine Clearance: 40.6 mL/min (A) (by C-G formula based on SCr of 1.58 mg/dL (H)).  Medical History: Past Medical History:  Diagnosis Date   Acute GI bleeding 06/27/2021   Anemia 01/29/2016   Atrial fibrillation (HCC)    post op.  s/p dc-cv 04/2008. 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  a- Echo 07/2008 Ef 55% mild peroprosthetic MVR with High transmitral gradient (mean 14).  b- normal coronaries by cath 12/2007   BPH (benign prostatic hyperplasia)    Cardiac resynchronization therapy pacemaker (CRT-P) in place 06/22/2011   CHF (congestive heart failure) (HCC)    EF 35-40 % 2011 due to valvular disease and diastolic dysfunction   Chronic back pain    CKD (chronic kidney disease), stage III (HCC)    COVID-19 virus infection 07/24/2020   Tested positive on July 16, 2020   Dysphagia    with normal barium swallow 09/2008   Endocarditis    GERD (gastroesophageal reflux disease)    Heavy alcohol use    history   History of atrial flutter 03/13/2008   History of atrial flutter - resolved.    History of GI bleed    Hypertension    IV drug abuse (HCC)    history of   Lower GI bleed 01/2016   Memory impairment  12/24/2015   Pacemaker 2010   Medications:  Infusions:   sodium chloride Stopped (03/06/22 1519)   feeding supplement (VITAL AF 1.2 CAL) 65 mL/hr at 03/07/22 0500   levETIRAcetam Stopped (03/07/22 0204)   norepinephrine (LEVOPHED) Adult infusion 2 mcg/min (03/07/22 0500)   propofol (DIPRIVAN) infusion 80 mcg/kg/min (03/07/22 0509)   valproate sodium 500 mg (03/07/22 0538)   Assessment: ES is a 76 year old male admitted for witnessed VF cardiac arrest requiring defibrillation x7. Patient was on warfarin prior to admission. Prior to admission warfarin dose was 5 mg Saturday and Sunday, 2.5 mg Monday, Tuesday, Wednesday, Thursday, Friday. Last warfarin dose unknown. INR today is therapeutic at 2.8. Hgb down to 9.8, platelets WNL. Pharmacy consulted to start heparin when INR <2.5 for mechanical MVR and Afib. INR today is 2.4. Will begin heparin at conservative rate.  Goal of Therapy:  Heparin level 0.3-0.7 units/ml Monitor platelets by anticoagulation protocol: Yes   Plan:  Start heparin at 1200 units/hr  No bolus given INR of 2.4 Monitor daily CBC and INR. Monitor for signs/symptoms of bleeding.  Thank you for allowing pharmacy to participate in this patient's care.  Enos Fling, PharmD PGY2 Pharmacy Resident 03/07/2022 7:06 AM Check [email or web address] for unit specific pharmacy number

## 2022-03-07 NOTE — Progress Notes (Signed)
He began having seizures again earlier today after lightening sedation.  I restarted propofol pending time to discuss things with the daughter.  I discussed his situation and my concerns about severe cerebral injury with her this evening.  At this time, we will continue sedation through tomorrow morning, and at that time wean.  If he began seizing again at that time, we will discuss goals of care at that time, and the family understands this.  Ritta Slot, MD [facility] [phone]  If 7pm- 7am, please page neurology on call as listed in AMION.

## 2022-03-07 NOTE — Progress Notes (Signed)
   NAME:  [name], MRN:  [id number], DOB:  June 14, 1946, LOS: 4 ADMISSION DATE:  02/15/2022, CONSULTATION DATE:  8/29 REFERRING MD:  Rubin Payor, CHIEF COMPLAINT:  VF arrest   History of Present Illness:  76 y/o male had an out of hospital VF arrest with 36 minute downtime.  Pertinent  Medical History  Atrial fibrillation Bacterial andocarditis, s/p St Jude valve 2009 BPH CHF, systolic CKD stage 3b GERD EtOH abuse in the past Hypertension   Significant Hospital Events: Including procedures, antibiotic start and stop dates in addition to other pertinent events   8/29 - admitted following arrest.  Echo EF 25-30% 8/30 weaning propofol to assess mental status  8/31 acute events overnight, remains sedated on ventilator 9/1 Continuous EEG applied overnight and revealed concerns for status epilepticus for which propofol drip was increased. Seizure activity again seen this am and propofol again increased  9/2 neuro holding burst suppression  Interim History / Subjective:  Deeply sedation on vent Intermittent seizure activity   Objective   Blood pressure (!) 97/56, pulse 65, temperature 98.6 F (37 C), resp. rate 20, weight 83.7 kg, SpO2 97 %.    Vent Mode: PRVC FiO2 (%):  [40 %] 40 % Set Rate:  [20 bmp] 20 bmp Vt Set:  [550 mL] 550 mL PEEP:  [5 cmH20] 5 cmH20 Plateau Pressure:  [16 cmH20-17 cmH20] 16 cmH20   Intake/Output Summary (Last 24 hours) at 03/07/2022 1009 Last data filed at 03/07/2022 5364 Gross per 24 hour  Intake 3021.21 ml  Output 2765 ml  Net 256.21 ml   Filed Weights   03/05/22 0500 03/06/22 0500 03/07/22 0500  Weight: 84.6 kg 86.1 kg 83.7 kg    Examination:  General:  In bed on vent HENT: NCAT ETT in place PULM: CTA B, vent supported breathing CV: RRR, no mgr GI: BS+, soft, nontender MSK: normal bulk and tone Neuro: sedated on vent   Resolved Hospital Problem list   Mildly elevated LFTs in setting of shock liver  Assessment & Plan:  VF cardiac  arrest in setting of electrolyte disturbances Non-ischemic cardiomyopathy Atrial fibrillation S/p mechanical MVR Stunned myocardium, elevated troponin Heparin infusion Tele Continue current treatment Monitor volume status  Acute hypoxemic respiratory failure  Full mechanical vent support VAP prevention Daily WUA/SBT  Extubate if mental status allows  Anoxic encephalopathy in setting of prolonged cardiac arrest Status epilepticus Keppra Valproate Holding burst suppression today Per neurology would monitor for several days after holding burst suppression to evaluate for neurologic recovery  AKI on CKD IIIb > back to baseline Monitor BMET and UOP Replace electrolytes as needed  Hypoalbuminemia Tube feeding  Anemia without bleeding, normocytic anemia Monitor for bleeding Transfuse PRBC for Hgb < 7 gm/dL   Best Practice (right click and "Reselect all SmartList Selections" daily)   Diet/type: tubefeeds DVT prophylaxis: prophylactic heparin  GI prophylaxis: PPI Lines: N/A Foley:  Yes, and it is still needed Code Status:  full code Last date of multidisciplinary goals of care discussion [I updated his daughter by bedside]  Critical care time: 75 minutes    [employer], MD  PCCM Pager: [phone] Cell: [phone] After 7:00 pm call Elink  [phone]

## 2022-03-07 NOTE — Progress Notes (Addendum)
Advanced Heart Failure Rounding Note  PCP-Cardiologist: None   Subjective:    Remains intubated/sedated. Unresponsive.   Seizure activity suppressed currently.   On NE 3. Rhythm and BP stable.    Objective:   Weight Range: 83.7 kg Body mass index is 28.06 kg/m.   Vital Signs:   Temp:  [95.5 F (35.3 C)-99.3 F (37.4 C)] 98.6 F (37 C) (09/02 0830) Pulse Rate:  [59-80] 65 (09/02 0830) Resp:  [4-25] 20 (09/02 0830) BP: (83-115)/(47-77) 97/56 (09/02 0815) SpO2:  [96 %-100 %] 97 % (09/02 0830) FiO2 (%):  [40 %] 40 % (09/02 0800) Weight:  [83.7 kg] 83.7 kg (09/02 0500) Last BM Date : 03/06/22  Weight change: Filed Weights   03/05/22 0500 03/06/22 0500 03/07/22 0500  Weight: 84.6 kg 86.1 kg 83.7 kg    Intake/Output:   Intake/Output Summary (Last 24 hours) at 03/07/2022 0915 Last data filed at 03/07/2022 0814 Gross per 24 hour  Intake 2944.99 ml  Output 2705 ml  Net 239.99 ml       Physical Exam    General:  Intubated/sedated HEENT: normal + ETT  + twitching Neck: supple. JVP to jaw Carotids 2+ bilat; no bruits. Cor: PMI nondisplaced. Irregular rate & rhythm. Mech s1 Lungs: clear Abdomen: soft, nontender, nondistended. No hepatosplenomegaly. No bruits or masses. Good bowel sounds. Extremities: no cyanosis, clubbing, rash, edema Neuro: intubated/sedated    Labs    CBC Recent Labs    03/06/22 0244 03/07/22 0604  WBC 11.7* 8.6  HGB 10.1* 9.8*  HCT 33.9* 31.4*  MCV 91.1 87.2  PLT 447* 409*    Basic Metabolic Panel Recent Labs    82/99/37 0244 03/07/22 0604  NA 139 141  K 3.8 3.9  CL 108 112*  CO2 24 23  GLUCOSE 119* 113*  BUN 21 22  CREATININE 1.58* 1.35*  CALCIUM 7.9* 8.1*  MG 1.8 2.1  PHOS 2.5 3.3    Liver Function Tests Recent Labs    03/06/22 0244 03/07/22 0604  AST 38 33  ALT 22 21  ALKPHOS 59 61  BILITOT 1.3* 1.0  PROT 5.3* 5.0*  ALBUMIN 2.3* 2.2*    No results for input(s): "LIPASE", "AMYLASE" in the last 72  hours. Cardiac Enzymes No results for input(s): "CKTOTAL", "CKMB", "CKMBINDEX", "TROPONINI" in the last 72 hours.  BNP: BNP (last 3 results) Recent Labs    12/19/21 1129 12/25/21 1401 02/22/22 1814  BNP 238.8* 366.8* 416.4*     ProBNP (last 3 results) No results for input(s): "PROBNP" in the last 8760 hours.   D-Dimer No results for input(s): "DDIMER" in the last 72 hours. Hemoglobin A1C No results for input(s): "HGBA1C" in the last 72 hours. Fasting Lipid Panel Recent Labs    03/07/22 0604  TRIG 68    Thyroid Function Tests No results for input(s): "TSH", "T4TOTAL", "T3FREE", "THYROIDAB" in the last 72 hours.  Invalid input(s): "FREET3"  Other results:   Imaging    CT HEAD WO CONTRAST ( )  Result Date: 03/06/2022 CLINICAL DATA:  Anoxic brain injury. EXAM: CT HEAD WITHOUT CONTRAST TECHNIQUE: Contiguous axial images were obtained from the base of the skull through the vertex without intravenous contrast. RADIATION DOSE REDUCTION: This exam was performed according to the departmental dose-optimization program which includes automated exposure control, adjustment of the mA and/or kV according to patient size and/or use of iterative reconstruction technique. COMPARISON:  03/17/2022 FINDINGS: Brain: There is no evidence for acute hemorrhage, hydrocephalus, mass lesion, or abnormal  extra-axial fluid collection. No definite CT evidence for acute infarction. Lacunar infarct noted right cerebellum. Vascular: No hyperdense vessel or unexpected calcification. Skull: No evidence for fracture. No worrisome lytic or sclerotic lesion. Sinuses/Orbits: The visualized paranasal sinuses and mastoid air cells are clear. Visualized portions of the globes and intraorbital fat are unremarkable. Other: None. IMPRESSION: 1. Stable.  No acute intracranial abnormality. 2. Old lacunar infarct in the right cerebellum. Electronically Signed   By: Kennith Center M.D.   On: 03/06/2022 15:23      Medications:     Scheduled Medications:  Chlorhexidine Gluconate Cloth  6 each Topical Q0600   docusate  100 mg Per Tube BID   mouth rinse  15 mL Mouth Rinse Q2H   pantoprazole  40 mg Per Tube Daily   polyethylene glycol  17 g Per Tube Daily    Infusions:  sodium chloride Stopped (03/06/22 1519)   feeding supplement (VITAL AF 1.2 CAL) 65 mL/hr at 03/07/22 0500   heparin 1,200 Units/hr (03/07/22 0814)   levETIRAcetam Stopped (03/07/22 0204)   norepinephrine (LEVOPHED) Adult infusion 2 mcg/min (03/07/22 0500)   propofol (DIPRIVAN) infusion 80 mcg/kg/min (03/07/22 0509)   valproate sodium 500 mg (03/07/22 0538)    PRN Medications: acetaminophen **OR** acetaminophen (TYLENOL) oral liquid 160 mg/5 mL **OR** acetaminophen, fentaNYL (SUBLIMAZE) injection, fentaNYL (SUBLIMAZE) injection, LORazepam, ondansetron (ZOFRAN) IV, mouth rinse   Assessment/Plan   VF arrest: - in setting of severe electrolyte disturbances. Downtime 36 minutes per PM - HS troponin up to 2800 after multiple rounds of CPR. No CAD on last LHC in 2009 - Echo 1/21 EF 35-40% RV ok. mechanical MVR ok - Echo 08/23 EF 25-30%, inferior and inferolateral WMA, RV moderately reduced, severe BAE, mechanical MVR okay - s/p CRT-P in past  - No further VF - Keep K > 4.0 Mg > 2.0 - Concern for anoxic brain injury (see below)   2. Chronic systolic CHF: - NICM dating back to 2009 - Echo 1/21 EF 35-40% RV ok. mechanical MVR ok - Echo 04/23: EF 35-40%, RV moderately reduced, mechanical MV okay - Echo 08/23: EF 25-30%, inferior and inferolateral WMA, RV moderately reduced, severe BAE, mechanical MVR okay - On NE 3 for BP support.  - Volume status up. Give lasix 40IV x 1   3. CKD IIIb: - Scr post arrest 1.57>1.74>1.97>1.87>1.58 > 1.35 - Baseline Scr appears 1.5-1.8   4. Hypokalemia/hypomagnesemia: - Keep K > 4.0 Mg > 2.0  5. Anoxic brain injury: - suspect severe - EEG with periods of atypical status epilepticus ->  mow suppressed.  - Spoke with Neuro. Will lighten sedation today. If recurrent status then will likely suggest transition to comfort care.  - CT x 2 no acute process   6. S/p Mechanical MVR - MV okay on echo this admit - supratherapeutic INR on admit, start heparin when INR < 2.5 - INR 2.4  Start heparin with no bolus   7. AF, chronic - Rate controlled. - INR 2.4. Heparin as above  CRITICAL CARE Performed by: [name]  Total critical care time: 35 minutes  Critical care time was exclusive of separately billable procedures and treating other patients.  Critical care was necessary to treat or prevent imminent or life-threatening deterioration.  Critical care was time spent personally by me (independent of midlevel providers or residents) on the following activities: development of treatment plan with patient and/or surrogate as well as nursing, discussions with consultants, evaluation of patient's response to treatment, examination of  patient, obtaining history from patient or surrogate, ordering and performing treatments and interventions, ordering and review of laboratory studies, ordering and review of radiographic studies, pulse oximetry and re-evaluation of patient's condition.  Length of Stay: 4  [name], MD  03/07/2022, 9:15 AM  Advanced Heart Failure Team Pager [phone] (M-F; 7a - 5p)  Please contact [facility] for night-coverage after hours (5p -7a ) and weekends on [email or web address]

## 2022-03-07 NOTE — Progress Notes (Signed)
EEG maint complete.  ?

## 2022-03-07 NOTE — Progress Notes (Signed)
Subjective: In a good burst-suppression pattern overnight.   Exam: Vitals:   03/07/22 0815 03/07/22 0830  BP: (!) 97/56   Pulse: 71 65  Resp: 20 20  Temp: 98.6 F (37 C) 98.6 F (37 C)  SpO2: 99% 97%   Gen: In bed, intubated  Neuro: MS: Does not open eyes or follow commands CN: Pupils are small but reactive bilaterally, corneals are intact Motor: Extensor posturing to noxious stimulation bilaterally Sensory: As above  Pertinent Labs: GFR 44  Impression: 76 year old male with post anoxic status epilepticus.  This is not the clearly dismal prognosis associated myoclonic status, though certainly is still very concerning for significant cerebral injury.    He was treated with Keppra and propofol.  His pattern improved considerably to a very centrally predominant 6 Hz activity which does have intermittent periods of cessation. I still had concern that his could be an ictal pattern and therefore decided to treat aggressively overnight.   I will lighten sedation today. If his seizures recur, we will have to have serious discussions regarding goals of care with family.   Recommendations: 1) Keppra 750 twice daily(renally dosed) 2) wean propofol 3) Depacon 500 3 times daily 4) VPA level  5) neurology will continue to follow  This patient is critically ill and at significant risk of neurological worsening, death and care requires constant monitoring of vital signs, hemodynamics,respiratory and cardiac monitoring, neurological assessment, discussion with family, other specialists and medical decision making of high complexity. I spent 35 minutes of neurocritical care time  in the care of  this patient. This was time spent independent of any time provided by nurse practitioner or PA.  Ritta Slot, MD [facility] [phone]  If 7pm- 7am, please page neurology on call as listed in AMION. 03/07/2022  9:19 AM

## 2022-03-07 NOTE — Procedures (Signed)
Patient Name: [name]  MRN: [id number]  Epilepsy Attending: Charlsie Quest  Referring Physician/Provider: Elmer Picker, NP Duration: 03/06/2022 1504 to 03/07/2022 1504   Patient history: 76yo M s/p cardiac arrest. EEG to evaluate for seizure   Level of alertness:  comatose   AEDs during EEG study: propofol, LEV, VPA   Technical aspects: This EEG study was done with scalp electrodes positioned according to the 10-20 International system of electrode placement. Electrical activity was reviewed with band pass filter of 1-70Hz , sensitivity of 7 uV/mm, display speed of 65mm/sec with a 60Hz  notched filter applied as appropriate. EEG data were recorded continuously and digitally stored.  Video monitoring was available and reviewed as appropriate.   Description: EEG showed burst suppression pattern with bursts of 3-7hz  theta-delta slowing lasting 1-2 seconds alternating with generalized suppression lasting 5-8 seconds. As sedation was held, generalized spikes were noted. Hyperventilation and photic stimulation were not performed.     ABNORMALITY -Burst suppression, generalized - Spike, generalized    IMPRESSION: This study was suggestive of profound diffuse encephalopathy, nonspecific etiology but likely due to sedation. As sedation was held, EEG showed evidence of epileptogenicity with generalized onset. No seizures were seen during this study.     Harry Bark 

## 2022-03-07 NOTE — Progress Notes (Signed)
Neuro MD gave verbal orders to decrease propofol from 80 to 50 at 9am and titrate down by 10 every hour thereafter. At 1230 RN noted bilateral eyelid twitching of upper and lower lids, no changes to responsiveness noted. Neuro MD gave verbal order to up propofol back to 80.

## 2022-03-07 NOTE — Progress Notes (Signed)
Peripherally Inserted Central Catheter Placement  The IV Nurse has discussed with the patient and/or persons authorized to consent for the patient, the purpose of this procedure and the potential benefits and risks involved with this procedure.  The benefits include less needle sticks, lab draws from the catheter, and the patient may be discharged home with the catheter. Risks include, but not limited to, infection, bleeding, blood clot (thrombus formation), and puncture of an artery; nerve damage and irregular heartbeat and possibility to perform a PICC exchange if needed/ordered by physician.  Alternatives to this procedure were also discussed.  Bard Power PICC patient education guide, fact sheet on infection prevention and patient information card has been provided to patient /or left at bedside.  Telephone consent obtained from daughter.  PICC Placement Documentation  PICC Double Lumen 03/07/22 Right Brachial 38 cm 0 cm (Active)  Indication for Insertion or Continuance of Line Vasoactive infusions;Limited venous access - need for IV therapy >5 days (PICC only) 03/07/22 1555  Exposed Catheter (cm) 0 cm 03/07/22 1555  Site Assessment Clean, Dry, Intact 03/07/22 1555  Lumen #1 Status Flushed;Saline locked;Blood return noted 03/07/22 1555  Lumen #2 Status Flushed;Saline locked;Blood return noted 03/07/22 1555  Dressing Type Transparent;Securing device 03/07/22 1555  Dressing Status Antimicrobial disc in place;Clean, Dry, Intact 03/07/22 1555  Safety Lock Not Applicable 03/07/22 1555  Line Care Connections checked and tightened 03/07/22 1555  Line Adjustment (NICU/IV Team Only) No 03/07/22 1555  Dressing Intervention New dressing 03/07/22 1555  Dressing Change Due 03/14/22 03/07/22 1555       Elliot Dally 03/07/2022, 3:56 PM

## 2022-03-08 ENCOUNTER — Inpatient Hospital Stay (HOSPITAL_COMMUNITY): Payer: 59

## 2022-03-08 DIAGNOSIS — G9340 Encephalopathy, unspecified: Secondary | ICD-10-CM | POA: Diagnosis not present

## 2022-03-08 DIAGNOSIS — R569 Unspecified convulsions: Secondary | ICD-10-CM | POA: Diagnosis not present

## 2022-03-08 DIAGNOSIS — I4901 Ventricular fibrillation: Secondary | ICD-10-CM | POA: Diagnosis not present

## 2022-03-08 LAB — HEPATIC FUNCTION PANEL
ALT: 18 U/L (ref 0–44)
AST: 34 U/L (ref 15–41)
Albumin: 2.3 g/dL — ABNORMAL LOW (ref 3.5–5.0)
Alkaline Phosphatase: 61 U/L (ref 38–126)
Bilirubin, Direct: 0.5 mg/dL — ABNORMAL HIGH (ref 0.0–0.2)
Indirect Bilirubin: 0.7 mg/dL (ref 0.3–0.9)
Total Bilirubin: 1.2 mg/dL (ref 0.3–1.2)
Total Protein: 5.2 g/dL — ABNORMAL LOW (ref 6.5–8.1)

## 2022-03-08 LAB — BASIC METABOLIC PANEL
Anion gap: 10 (ref 5–15)
BUN: 27 mg/dL — ABNORMAL HIGH (ref 8–23)
CO2: 22 mmol/L (ref 22–32)
Calcium: 7.7 mg/dL — ABNORMAL LOW (ref 8.9–10.3)
Chloride: 104 mmol/L (ref 98–111)
Creatinine, Ser: 1.38 mg/dL — ABNORMAL HIGH (ref 0.61–1.24)
GFR, Estimated: 52 mL/min — ABNORMAL LOW (ref >60)
Glucose, Bld: 173 mg/dL — ABNORMAL HIGH (ref 70–99)
Potassium: 4.4 mmol/L (ref 3.5–5.1)
Sodium: 136 mmol/L (ref 135–145)

## 2022-03-08 LAB — PROTIME-INR
INR: 2.6 — ABNORMAL HIGH (ref 0.8–1.2)
Prothrombin Time: 27.3 seconds — ABNORMAL HIGH (ref 11.4–15.2)

## 2022-03-08 LAB — CBC
HCT: 31.1 % — ABNORMAL LOW (ref 39.0–52.0)
Hemoglobin: 9.8 g/dL — ABNORMAL LOW (ref 13.0–17.0)
MCH: 28.7 pg (ref 26.0–34.0)
MCHC: 31.5 g/dL (ref 30.0–36.0)
MCV: 91.2 fL (ref 80.0–100.0)
Platelets: 453 10*3/uL — ABNORMAL HIGH (ref 150–400)
RBC: 3.41 MIL/uL — ABNORMAL LOW (ref 4.22–5.81)
RDW: 21.1 % — ABNORMAL HIGH (ref 11.5–15.5)
WBC: 10.8 10*3/uL — ABNORMAL HIGH (ref 4.0–10.5)
nRBC: 0 % (ref 0.0–0.2)

## 2022-03-08 LAB — GLUCOSE, CAPILLARY
Glucose-Capillary: 112 mg/dL — ABNORMAL HIGH (ref 70–99)
Glucose-Capillary: 112 mg/dL — ABNORMAL HIGH (ref 70–99)
Glucose-Capillary: 124 mg/dL — ABNORMAL HIGH (ref 70–99)
Glucose-Capillary: 95 mg/dL (ref 70–99)
Glucose-Capillary: 97 mg/dL (ref 70–99)

## 2022-03-08 LAB — MAGNESIUM: Magnesium: 1.8 mg/dL (ref 1.7–2.4)

## 2022-03-08 LAB — PHOSPHORUS: Phosphorus: 3.7 mg/dL (ref 2.5–4.6)

## 2022-03-08 LAB — CULTURE, BLOOD (ROUTINE X 2)
Culture: NO GROWTH
Special Requests: ADEQUATE

## 2022-03-08 LAB — HEPARIN LEVEL (UNFRACTIONATED): Heparin Unfractionated: 0.28 IU/mL — ABNORMAL LOW (ref 0.30–0.70)

## 2022-03-08 LAB — TRIGLYCERIDES: Triglycerides: 1099 mg/dL — ABNORMAL HIGH (ref <150)

## 2022-03-08 MED ORDER — FENTANYL 2500MCG IN NS 250ML (10MCG/ML) PREMIX INFUSION
0.0000 ug/h | INTRAVENOUS | Status: DC
  Administered 2022-03-08: 25 ug/h via INTRAVENOUS
  Filled 2022-03-08: qty 250

## 2022-03-08 MED ORDER — ACETAMINOPHEN 650 MG RE SUPP: 650.0000 mg | Freq: Four times a day (QID) | RECTAL | Status: DC | PRN

## 2022-03-08 MED ORDER — SODIUM CHLORIDE 0.9 % IV SOLN
INTRAVENOUS | Status: DC
Start: 2022-03-08 — End: 2022-03-09

## 2022-03-08 MED ORDER — GLYCOPYRROLATE 0.2 MG/ML IJ SOLN: 0.2000 mg | INTRAMUSCULAR | Status: DC | PRN

## 2022-03-08 MED ORDER — POLYVINYL ALCOHOL 1.4 % OP SOLN: 1.0000 [drp] | Freq: Four times a day (QID) | OPHTHALMIC | Status: DC | PRN

## 2022-03-08 MED ORDER — ACETAMINOPHEN 325 MG PO TABS: 650.0000 mg | ORAL_TABLET | Freq: Four times a day (QID) | ORAL | Status: DC | PRN

## 2022-03-08 MED ORDER — GLYCOPYRROLATE 1 MG PO TABS: 1.0000 mg | ORAL_TABLET | ORAL | Status: DC | PRN

## 2022-03-18 NOTE — Consult Note (Signed)
Orthopaedic Trauma Service (OTS) Consultation   Patient ID: [name] MRN: [id number] DOB/AGE: 12/16/1945 76 y.o.   Reason for Consult: bilateral knee swelling, pain, effusions Referring Physician: Regan Lemming, MD  HPI: [name] is an 76 y.o. male with several month history of knee pain but increased last three days associated with increasing swelling. Denies fever, chills, or significant malaise. Not totally reliable historian however.  ED PA-C drew labs from right knee earlier today but labs still pending because the machine to run cell count is down. Fluid reportedly quite viscous. Patient states he can ambulate and move knees but painful to do so. Does report h/o gout. No alleviationg factors.   Past Medical History:  Diagnosis Date   Acute GI bleeding 06/27/2021   Anemia 01/29/2016   Atrial fibrillation ([place])    post op.  s/p dc-cv 04/2008. 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  a- Echo 07/2008 Ef 55% mild peroprosthetic MVR with High transmitral gradient (mean 14).  b- normal coronaries by cath 12/2007   BPH (benign prostatic hyperplasia)    Cardiac resynchronization therapy pacemaker (CRT-P) in place 06/22/2011   CHF (congestive heart failure) (HCC)    EF 35-40 % 2011 due to valvular disease and diastolic dysfunction   Chronic back pain    CKD (chronic kidney disease), stage III ([place])    COVID-19 virus infection 07/24/2020   Tested positive on July 16, 2020   Dysphagia    with normal barium swallow 09/2008   Endocarditis    GERD (gastroesophageal reflux disease)    Heavy alcohol use    history   History of atrial flutter 03/13/2008   History of atrial flutter - resolved.    History of GI bleed    Hypertension    IV drug abuse ([place])    history of   Lower GI bleed 01/2016   Memory impairment  12/24/2015   Pacemaker 2010    Past Surgical History:  Procedure Laterality Date   BIOPSY  06/29/2021   Procedure: BIOPSY;  Surgeon: Rush Landmark Telford Nab., MD;  Location: [facility] ENDOSCOPY;  Service: Gastroenterology;;   [place] N/A 12/13/2017   Procedure: BIV PACEMAKER GENERATOR CHANGEOUT;  Surgeon: [name], MD;  Location: [place] CV LAB;  Service: Cardiovascular;  Laterality: N/A;   CARDIAC VALVE REPLACEMENT     mitral valve, st jude mechanical    COLONOSCOPY N/A 03/26/2014   Procedure: COLONOSCOPY;  Surgeon: Milus Banister, MD;  Location: [place];  Service: Endoscopy;  Laterality: N/A;   ENTEROSCOPY N/A 03/28/2014   Procedure: ENTEROSCOPY;  Surgeon: Milus Banister, MD;  Location: [place];  Service: Endoscopy;  Laterality: N/A;   ENTEROSCOPY N/A 06/29/2021   Procedure: ENTEROSCOPY;  Surgeon: Rush Landmark Telford Nab., MD;  Location: [place];  Service: Gastroenterology;  Laterality: N/A;   ESOPHAGOGASTRODUODENOSCOPY N/A 03/25/2014   Procedure: ESOPHAGOGASTRODUODENOSCOPY (EGD);  Surgeon: Gatha Mayer, MD;  Location: [facility] ENDOSCOPY;  Service: Endoscopy;  Laterality: N/A;   GIVENS CAPSULE STUDY N/A 03/26/2014   Procedure: GIVENS CAPSULE STUDY;  Surgeon: Milus Banister, MD;  Location: [place];  Service: Endoscopy;  Laterality: N/A;   HOT HEMOSTASIS N/A 06/29/2021   Procedure: HOT HEMOSTASIS (ARGON PLASMA COAGULATION/BICAP);  Surgeon: Irving Copas., MD;  Location: [facility] ENDOSCOPY;  Service: Gastroenterology;  Laterality: N/A;   PACEMAKER INSERTION     inserted about 4-5 years ago   [place]  06/29/2021   Procedure: SUBMUCOSAL TATTOO INJECTION;  Surgeon: Rush Landmark Telford Nab., MD;  Location: [facility] ENDOSCOPY;  Service: Gastroenterology;;    Family History  Problem Relation Age of Onset   Coronary artery disease Mother    Cancer Father        GI   Cancer Brother        Lung    Social History:  reports that he  has quit smoking. His smoking use included cigarettes. He has never used smokeless tobacco. He reports that he does not drink alcohol and does not use drugs.  Allergies: No Known Allergies  Medications: I have reviewed the patient's current medications.  No results found for this or any previous visit (from the past 48 hour(s)).  No results found.  Intake/Output    None      ROS as above Blood pressure 104/67, pulse 80, temperature 98.5 F (36.9 C), temperature source Oral, resp. rate 18, height _0  (1.727 m), weight 83.7 kg, SpO2 93 %. Physical Exam Pleasant and no distress NCAT LLE No traumatic wounds  Large effusion, mildly tender, able to generate 50 degrees of motion without pain, warm knee without redness  No knee or ankle effusion  Knee stable to varus/ valgus and anterior/posterior stress  Sens DPN, SPN, TN intact  Motor EHL, ext, flex, evers intact  DP 2+, mild edema RLE Puncture from prior aspiration  Moderate effusion, mildly tender, able to generate 50 degrees of motion without pain, warm knee without redness  No knee or ankle effusion  Knee stable to varus/ valgus and anterior/posterior stress  Sens DPN, SPN, TN intact  Motor EHL, ext, flex, evers intact  DP 2+, mild edema  Elevated CRP and ESR.  Assessment/Plan: Suspect inflammatory arthropathy given good ROM Will aspirate the left knee  After consent was obtained, using sterile technique the left knee was prepped and joint was entered with 18 gauge needle withdrawing  approx 120 ml's of viscous straw colored fluid. This was sent for cell count and culture. The procedure was well tolerated.    PLAN: Will follow labs. PT/OT with activities and WBAT.  Altamese Moore, MD [facility], [facility] [phone]   ADDENDUM: No growth on cultures. WBC 40k, Gout crystals present.  [facility] [place] [address] [phone] Jenetta [phone]  (F)    After 5pm and on the weekends please log on to Amion, go to orthopaedics and the look under the [facility] Call for the provider(s) on call. You can also call our office at [phone] and then follow the prompts to be connected to the call team.

## 2022-03-24 NOTE — Addendum Note (Signed)
Addended by: Douglass Rivers D on: 03/24/2022 11:57 AM   Modules accepted: Level of Service

## 2022-03-24 NOTE — Progress Notes (Signed)
Remote pacemaker transmission.   

## 2022-04-05 NOTE — Progress Notes (Signed)
[facility] P[facility]M afternoon rounds  Spoke with [name], she is waiting for one-two more family members to come then she plans to return this evening and we will proceed with discontinuing mechanical ventilation at that point.  Heber [place], MD [facility] Pager: [phone] Cell: [phone] After 7:00 pm call Elink  [phone]

## 2022-04-05 NOTE — Procedures (Signed)
Patient Name: [name]  MRN: [id number]  Epilepsy Attending: Charlsie Quest  Referring Physician/Provider: Elmer Picker, NP Duration: 03/07/2022 1504 to 26-Mar-2022 1353   Patient history: 76yo M s/p cardiac arrest. EEG to evaluate for seizure   Level of alertness:  comatose   AEDs during EEG study: propofol, LEV, VPA, LCM   Technical aspects: This EEG study was done with scalp electrodes positioned according to the 10-20 International system of electrode placement. Electrical activity was reviewed with band pass filter of 1-70Hz , sensitivity of 7 uV/mm, display speed of 45mm/sec with a 60Hz  notched filter applied as appropriate. EEG data were recorded continuously and digitally stored.  Video monitoring was available and reviewed as appropriate.   Description: EEG showed burst suppression pattern with bursts of 3-7hz  theta-delta slowing lasting 1-2 seconds alternating with generalized suppression lasting 3-6 seconds. Generalized spikes were noted at times.  After around 1300 on 03/26/2022, EEG showed generalized periodic epileptiform discharges at 2.5 to 3 Hz.  No clinical signs were seen during this.  This EEG is consistent with generalized electrographic status epilepticus.  Hyperventilation and photic stimulation were not performed.     Patient event button was pressed on 03/26/2022 at 0851. Per RN, patient had tremors on tongue and chin. Concomitant eeg showed burst suppression pattern with generalized spikes.   Patient event button was pressed on Mar 26, 2022 at 1035. Per RN, patient had  twitching in eyelids. Concomitant eeg showed generalized periodic discharges at 2.5 to 3 Hz.    ABNORMALITY - Burst suppression, generalized -Electrographic status epilepticus, generalized    IMPRESSION: This study showed evidence of epileptogenicity with generalized onset as well as profound diffuse encephalopathy, nonspecific etiology.  After 1300, EEG worsened and was consistent with generalized  electrographic status epilepticus.   Patient event button was pressed on March 26, 2022 at 0851. Per RN, patient had tremors on tongue and chin without definitive concomitant eeg change. However, focal motor seizures may not be seen on scalp eeg. Therefore, clinical correlation is recommended.   Patient event button was pressed on 03-26-2022 at 1035. Per RN, patient had  twitching in eyelids.  Concomitant EEG was consistent with seizure with generalized onset.    [name] 05/08/2022

## 2022-04-05 NOTE — Progress Notes (Signed)
   02-Apr-2022 1900  Clinical Encounter Type  Visited With Patient and family together  Visit Type Initial;Psychological support;Spiritual support  Referral From Physician  Consult/Referral To Chaplain  Recommendations Address Amticipatory Grief  Spiritual Encounters  Spiritual Needs Sacred text;Prayer;Ritual;Emotional;Grief support  Stress Factors  Patient Stress Factors None identified  Family Stress Factors Loss;Loss of control;Major life changes

## 2022-04-05 NOTE — Death Summary Note (Signed)
DEATH SUMMARY   Patient Details  Name: [name] MRN: [id number] DOB: Jan 16, 1946 Admission/Discharge Information   Admit Date:  03/13/2022  Date of Death: Date of Death: 03/13/2022  Time of Death: Time of Death: 10/27/2003  Length of Stay: 5  Referring Physician: Gust Rung, DO   Reason(s) for Hospitalization  Out of hospital cardiac arrest  Diagnoses  Preliminary cause of death:  Ventricular fibrillation Secondary Diagnoses (including complications and co-morbidities):  Principal Problem:   Ventricular fibrillation [facility]) Active Problems:   Acute respiratory failure with hypoxia (HCC)   AKI (acute kidney injury) [facility])   Encephalopathy acute   Brief Hospital Course (including significant findings, care, treatment, and services provided and events leading to death)  [name] is a 76 y.o. year old male who was admitted on 03-09-2023 after an out of hospital ventricular fibrillation cardiac arrest with 36 minute downtime.  He was comatose on arrival, admitted to the intensive care unit requiring life support with mechanical ventilation.  An echocardiogram showed that his LVEF is 25 to 30%.  The patient had a history of Saint Jude valve which was placed in 27-Oct-2007, this was noted to be present on echo without complication.  He was unable to be weaned off mechanical ventilation.  He had status epilepticus seen on EEG testing.  On repeated episodes we treated him with burst suppression and attempted to wean for suppression but unfortunately continued to have status epilepticus.  The patient's family was informed that this is consistent with a very poor prognosis from severe anoxic brain injury.  They request to withdraw care.  He died peacefully with family at the bedside.    Pertinent Labs and Studies  Significant Diagnostic Studies DG CHEST PORT 1 VIEW  Result Date: 03/13/22 CLINICAL DATA:  Respiratory failure. EXAM: PORTABLE CHEST 1 VIEW COMPARISON:  03/07/2022 FINDINGS: 0620  hours. The cardio pericardial silhouette is enlarged. Bibasilar collapse/consolidation again noted, decreased on the left and similar on the right. Stable appearance small right pleural effusion. Left permanent pacemaker again noted. A tracheal tube tip is 4.1 cm above the base of the carina. The NG tube passes into the stomach although the distal tip position is not included on the film. Right PICC line tip overlies the SVC/RA junction. Telemetry leads overlie the chest. IMPRESSION: Interval improvement in aeration at the left base. Otherwise no substantial change. Electronically Signed   By: Kennith Center M.D.   On: March 13, 2022 08:06   DG CHEST PORT 1 VIEW  Result Date: 03/07/2022 CLINICAL DATA:  PICC placement. EXAM: PORTABLE CHEST 1 VIEW COMPARISON:  03/05/2022 and older studies. FINDINGS: New right-sided PICC.  Tip projects in the lower superior vena cava. Endotracheal tube has been retracted since the prior study, tip now projecting 3 cm above the carina. Stable nasal/orogastric tube and left anterior chest wall biventricular cardioverter-defibrillator. Cardiac silhouette mildly enlarged. Bilateral lung base opacities obscuring the right and partly obscuring the left hemidiaphragms consistent with a combination of pleural fluid and atelectasis. Pneumonia not excluded. Upper lungs are clear. No pneumothorax. IMPRESSION: 1. Well-positioned new right PICC. 2. Endotracheal tube has been retracted, now well positioned 3 cm above the carina. 3. Persistent lung base opacities consistent with a combination of pleural effusions and atelectasis. Consider a component of pneumonia if there are consistent clinical findings. Electronically Signed   By: Amie Portland M.D.   On: 03/07/2022 15:57   [place] EKG SITE RITE  Result Date: 03/07/2022 If [facility] image not attached,  placement could not be confirmed due to current cardiac rhythm.  CT HEAD WO CONTRAST ( )  Result Date: 03/06/2022 CLINICAL DATA:  Anoxic brain  injury. EXAM: CT HEAD WITHOUT CONTRAST TECHNIQUE: Contiguous axial images were obtained from the base of the skull through the vertex without intravenous contrast. RADIATION DOSE REDUCTION: This exam was performed according to the departmental dose-optimization program which includes automated exposure control, adjustment of the mA and/or kV according to patient size and/or use of iterative reconstruction technique. COMPARISON:  02/14/2022 FINDINGS: Brain: There is no evidence for acute hemorrhage, hydrocephalus, mass lesion, or abnormal extra-axial fluid collection. No definite CT evidence for acute infarction. Lacunar infarct noted right cerebellum. Vascular: No hyperdense vessel or unexpected calcification. Skull: No evidence for fracture. No worrisome lytic or sclerotic lesion. Sinuses/Orbits: The visualized paranasal sinuses and mastoid air cells are clear. Visualized portions of the globes and intraorbital fat are unremarkable. Other: None. IMPRESSION: 1. Stable.  No acute intracranial abnormality. 2. Old lacunar infarct in the right cerebellum. Electronically Signed   By: Kennith Center M.D.   On: 03/06/2022 15:23   Overnight EEG with video  Result Date: 03/06/2022 Richard Quest, MD     03/07/2022  8:54 AM Patient Name: [name] MRN: [id number] Epilepsy Attending: Charlsie Hubbard Referring Physician/Provider: Elmer Picker, NP Duration: 03/05/2022 1504 to 03/06/2022 1504  Patient history: 76yo M s/p cardiac arrest s/p cardiac arrest. EEG to evaluate for seizure  Level of alertness:  comatose  AEDs during EEG study: propofol, LEV, VPA  Technical aspects: This EEG study was done with scalp electrodes positioned according to the 10-20 International system of electrode placement. Electrical activity was reviewed with band pass filter of 1-70Hz , sensitivity of 7 uV/mm, display speed of 69mm/sec with a 60Hz  notched filter applied as appropriate. EEG data were recorded continuously and digitally stored.  Video monitoring was  available and reviewed as appropriate.  Description: At the beginning of the study, EEG showed generalized and maximal bilateral frontocentral spikes and polyspikes at 3.5 to 4 Hz.  At times, left arm shaking was noted but no clinical signs were seen during rest of the time  This EEG pattern is most consistent with electrographic status epilepticus.  As medications were adjusted, EEG improved after around 2000 on 03/05/2022. EEG then showed continuous generalized sharply contoured 3 to 6 Hz theta-delta slowing admixed with 12 to 14 Hz generalized beta activity.  After around 0745 on 03/06/2022, 2-6 seconds of generalized EEG attenuation were also noted.  Hyperventilation and photic stimulation were not performed.   Patient event button was pressed on 03/06/2022 at 0610 for posturing and trembling and at 0743 for left arm jerking.  Concomitant EEG before, during and after the event did not show any definite EEG changes suggest seizure.  However, focal motor seizures may not be seen on scalp EEG.  ABNORMALITY -Electrographic status epilepticus, generalized -Continuous slow, generalized   IMPRESSION: This study was initially suggestive of electrographic status epilepticus with generalized onset.  As medications were adjusted, EEG improved after around 2000 on 03/05/2022.  Subsequently, EEG was suggestive of severe to profound diffuse encephalopathy, nonspecific etiology.  Patient event button was pressed as described above with no definite EEG change.  However focal motor seizures may not be seen on scalp EEG.  Clinical correlation is recommended.  Another differential includes stimulation induced myoclonus Priyanka 03/07/2022   EEG adult  Result Date: 03/05/2022 03/07/2022, MD     03/06/2022  8:47 AM Patient Name: [name]  Nigh MRN: [id number] Epilepsy Attending: Charlsie Hubbard Referring Physician/Provider: Lorin Glass, MD Date: 03/05/2022 Duration: 23.32 mins  Patient history: 76yo M s/p cardiac arrest. EEG to  evaluate for seizure  Level of alertness:  comatose  AEDs during EEG study: None  Technical aspects: This EEG study was done with scalp electrodes positioned according to the 10-20 International system of electrode placement. Electrical activity was reviewed with band pass filter of 1-70Hz , sensitivity of 7 uV/mm, display speed of 70mm/sec with a  notched filter applied as appropriate. EEG data were recorded continuously and digitally stored.  Video monitoring was available and reviewed as appropriate.  Description: EEG showed near continuous generalized predominantly 6 to 8 Hz theta-alpha activity admixed with intermittent generalized 2 to 3 Hz delta slowing hyperventilation and photic stimulation were not performed.    ABNORMALITY -Continuous slow, generalized   IMPRESSION: This study is suggestive of moderate to severe diffuse encephalopathy, nonspecific etiology.  No seizures or epileptiform discharges were seen throughout the recording.   [name]    DG Chest Port 1 View  Result Date: 03/05/2022 CLINICAL DATA:  Endotracheal tube present.  Cardiac arrest. EXAM: PORTABLE CHEST 1 VIEW COMPARISON:  AP chest 02/08/2022 CT chest 01/13/2008 FINDINGS: Left chest wall cardiac pacer is again seen with leads overlying the right atrium and right ventricle. Interval advancement of endotracheal tube with the tip now overlying the proximal right mainstem bronchus. Recommend retraction approximately 3-5 cm. Orogastric tube descends below the diaphragm with the tip excluded by collimation. Mitral valve prosthesis is again noted. Cardiac silhouette again appears moderately enlarged. There are again moderately decreased lung volumes. Interval increase in bilateral basilar heterogeneous airspace opacities and likely small pleural effusions. No pneumothorax is seen. No acute skeletal abnormality. IMPRESSION: 1. Interval advancement of endotracheal tube with the tip terminating within the proximal right mainstem  bronchus. Recommend retraction approximately 3-5 cm. 2. Decreased lung volumes with increased small pleural effusions and right-greater-than-left basilar heterogeneous airspace opacities, atelectasis versus pneumonia. Critical Value/emergent results of endotracheal tube positioning within proximal right mainstem bronchus were called by telephone at the time of interpretation on 03/05/2022 at 8:11 am to provider Delrae Sawyers, charge nurse on the patient floor, who verbally acknowledged these results. Electronically Signed   By: Neita Garnet M.D.   On: 03/05/2022 08:16   US RENAL  Result Date: 03/04/2022 CLINICAL DATA:  Acute renal insufficiency EXAM: RENAL / URINARY TRACT ULTRASOUND COMPLETE COMPARISON:  03/28/2014 FINDINGS: Right Kidney: Renal measurements: 8.7 x 4.1 x 3.4 cm = volume: 63.3 mL. Increased renal cortical echotexture. No hydronephrosis or nephrolithiasis. No renal mass. Left Kidney: Renal measurements: 10.9 x 5.0 x 4.2 cm = volume: 119.8 mL. Echogenicity within normal limits. No mass or hydronephrosis visualized. Bladder: Bladder is decompressed with a Foley catheter. Other: None. IMPRESSION: 1. Mild right renal atrophy, with increased renal cortical echotexture consistent with medical renal disease. 2. Unremarkable left kidney. Electronically Signed   By: Sharlet Salina M.D.   On: 03/04/2022 10:45   ECHOCARDIOGRAM COMPLETE  Result Date: 02/27/2022    ECHOCARDIOGRAM REPORT   Patient Name:   KELLIS MCADAM Date of Exam: 02/19/2022 Medical Rec #:  [id number]       Height:       68.0 in Accession #:    [id number]      Weight:       184.5 lb Date of Birth:  02-Feb-1946       BSA:  1.975 m Patient Age:    20 years        BP:           102/71 mmHg Patient Gender: M               HR:           63 bpm. Exam Location:  Inpatient Procedure: 2D Echo, Cardiac Doppler and Color Doppler Indications:    Cardiac arrest  History:        Patient has prior history of Echocardiogram examinations, most                  recent 10/24/2021. CHF, TIA; Arrythmias:Atrial Flutter.  Sonographer:    Mikki Harbor Referring Phys: [id number] RAVI AGARWALA  Sonographer Comments: Echo performed with patient supine and on artificial respirator. IMPRESSIONS  1. Left ventricular ejection fraction, by estimation, is 25 to 30%. The left ventricle has severely decreased function. The left ventricle demonstrates regional wall motion abnormalities (inferior and inferolateral WMAs). The left ventricular internal cavity size was moderately dilated (despite PLAX LVIDD). There is moderate concentric left ventricular hypertrophy. Left ventricular diastolic parameters are indeterminate.  2. Right ventricular systolic function is moderately reduced. The right ventricular size is moderately enlarged. There is moderately elevated pulmonary artery systolic pressure. The estimated right ventricular systolic pressure is 47.7 mmHg.  3. Left atrial size was severely dilated.  4. Right atrial size was severely dilated.  5. Prosthetic valve: PHT 83 ms, TVI 2.71. The mitral valve has been repaired/replaced. Mild mitral valve regurgitation. The mean mitral valve gradient is 3.0 mmHg.  6. Tricuspid valve regurgitation is mild to moderate.  7. The aortic valve was not well visualized. Aortic valve regurgitation is trivial. No aortic stenosis is present. Comparison(s): LVEF appears worse from prior. FINDINGS  Left Ventricle: Left ventricular ejection fraction, by estimation, is 25 to 30%. The left ventricle has severely decreased function. The left ventricle demonstrates regional wall motion abnormalities. The left ventricular internal cavity size was moderately dilated. There is moderate concentric left ventricular hypertrophy. Left ventricular diastolic parameters are indeterminate.  LV Wall Scoring: The mid inferolateral segment is akinetic. The basal inferolateral segment and basal inferior segment are hypokinetic. Right Ventricle: The right ventricular size  is moderately enlarged. No increase in right ventricular wall thickness. Right ventricular systolic function is moderately reduced. There is moderately elevated pulmonary artery systolic pressure. The tricuspid  regurgitant velocity is 2.86 m/s, and with an assumed right atrial pressure of 15 mmHg, the estimated right ventricular systolic pressure is 47.7 mmHg. Left Atrium: Left atrial size was severely dilated. Right Atrium: Right atrial size was severely dilated. Pericardium: There is no evidence of pericardial effusion. Mitral Valve: Prosthetic valve: PHT 83 ms, TVI 2.71. The mitral valve has been repaired/replaced. Mild mitral valve regurgitation. MV peak gradient, 10.8 mmHg. The mean mitral valve gradient is 3.0 mmHg. Tricuspid Valve: The tricuspid valve is normal in structure. Tricuspid valve regurgitation is mild to moderate. No evidence of tricuspid stenosis. Aortic Valve: The aortic valve was not well visualized. Aortic valve regurgitation is trivial. Aortic regurgitation PHT measures 613 msec. No aortic stenosis is present. Aortic valve mean gradient measures 2.0 mmHg. Aortic valve peak gradient measures 3.5 mmHg. Aortic valve area, by VTI measures 1.51 cm. Pulmonic Valve: The pulmonic valve was normal in structure. Pulmonic valve regurgitation is not visualized. No evidence of pulmonic stenosis. Aorta: The aortic root and ascending aorta are structurally normal, with no evidence of dilitation. IAS/Shunts: No atrial  level shunt detected by color flow Doppler.  LEFT VENTRICLE PLAX 2D LVIDd:         5.15 cm LVIDs:         4.60 cm LV PW:         1.25 cm LV IVS:        1.25 cm LVOT diam:     1.80 cm LV SV:         31 LV SV Index:   16 LVOT Area:     2.54 cm  LV Volumes (MOD) LV vol d, MOD A2C: 114.0 ml LV vol d, MOD A4C: 106.0 ml LV vol s, MOD A2C: 68.1 ml LV vol s, MOD A4C: 77.7 ml LV SV MOD A2C:     45.9 ml LV SV MOD A4C:     106.0 ml LV SV MOD BP:      36.3 ml RIGHT VENTRICLE RV S prime:     6.53 cm/s  LEFT ATRIUM              Index        RIGHT ATRIUM           Index LA diam:        5.90 cm  2.99 cm/m   RA Area:     32.00 cm LA Vol (A2C):   166.0 ml 84.05 ml/m  RA Volume:   115.00 ml 58.23 ml/m LA Vol (A4C):   170.0 ml 86.08 ml/m LA Biplane Vol: 172.0 ml 87.09 ml/m  AORTIC VALVE                    PULMONIC VALVE AV Area (Vmax):    1.71 cm     PV Vmax:       0.43 m/s AV Area (Vmean):   1.52 cm     PV Peak grad:  0.7 mmHg AV Area (VTI):     1.51 cm AV Vmax:           93.05 cm/s AV Vmean:          61.150 cm/s AV VTI:            0.206 m AV Peak Grad:      3.5 mmHg AV Mean Grad:      2.0 mmHg LVOT Vmax:         62.50 cm/s LVOT Vmean:        36.600 cm/s LVOT VTI:          0.122 m LVOT/AV VTI ratio: 0.59 AI PHT:            613 msec  AORTA Ao Root diam: 3.90 cm Ao Asc diam:  3.20 cm MITRAL VALVE                TRICUSPID VALVE MV Area (PHT): 2.21 cm     TR Peak grad:   32.7 mmHg MV Area VTI:   0.70 cm     TR Vmax:        286.00 cm/s MV Peak grad:  10.8 mmHg MV Mean grad:  3.0 mmHg     SHUNTS MV Vmax:       1.64 m/s     Systemic VTI:  0.12 m MV Vmean:      69.5 cm/s    Systemic Diam: 1.80 cm MV Decel Time: 344 msec MV E velocity: 132.50 cm/s Riley Lam MD Electronically signed by Riley Lam MD Signature Date/Time: 02/10/2022/1:42:44 PM    Final  DG Abd Portable 1V  Result Date: 02/13/2022 CLINICAL DATA:  Enteric tube placement EXAM: PORTABLE ABDOMEN - 1 VIEW COMPARISON:  Previous studies including the examination done earlier today FINDINGS: There is interval repositioning of enteric tube with its tip in the region of body of stomach. There are multiple gallbladder stones. Liver appears to be enlarged in size. Bowel gas pattern is nonspecific. Biventricular pacer leads are noted in the heart. There is prosthetic cardiac valve. IMPRESSION: Tip of enteric tube is seen in the region of body of stomach. Nonspecific bowel gas pattern. Gallbladder stones. Electronically Signed   By: Ernie Avena M.D.   On: 02/22/2022 13:32   EEG adult  Result Date: 02/15/2022 Richard Quest, MD     02/20/2022 11:35 AM Patient Name: ARLIN SASS MRN: [id number] Epilepsy Attending: Charlsie Hubbard Referring Physician/Provider: Lynnell Catalan, MD Date: 02/22/2022 Duration: 22.41 mins Patient history: 76yo M s/p cardiac arrest. EEG to evaluate for seizure Level of alertness:  comatose AEDs during EEG study: None Technical aspects: This EEG study was done with scalp electrodes positioned according to the 10-20 International system of electrode placement. Electrical activity was reviewed with band pass filter of 1-70Hz , sensitivity of 7 uV/mm, display speed of 29mm/sec with a 60Hz  notched filter applied as appropriate. EEG data were recorded continuously and digitally stored.  Video monitoring was available and reviewed as appropriate. Description: EEG showed continuous generalized background attenuation which was not reactive to tactile stimulation.  Hyperventilation and photic stimulation were not performed.   ABNORMALITY -Background attenuation, generalized IMPRESSION: This study is suggestive of profound diffuse encephalopathy, nonspecific etiology.  No seizures or epileptiform discharges were seen throughout the recording.   DG Abd 1 View  Result Date: 02/25/2022 CLINICAL DATA:  The OG tube insertion EXAM: ABDOMEN - 1 VIEW COMPARISON:  Chest radiograph 03/01/2022 FINDINGS: Enteric tube extends just below the left hemidiaphragm, likely terminating at the level of the GE junction. Multiple calcifications in the right abdomen are consistent with gallstones. Prosthetic mitral valve and distal TIPS of cardiac assist device leads are seen. Heart appears mildly enlarged. IMPRESSION: OG tube terminates just below the left hemidiaphragm, likely at the level of the GE junction. Electronically Signed   By: 03/05/2022 M.D.   On: 02/09/2022 09:20   CT HEAD WO CONTRAST (03/05/2022)  Result Date:  02/09/2022 CLINICAL DATA:  Mental status change, unknown cause. Status post cardiac arrest EXAM: CT HEAD WITHOUT CONTRAST TECHNIQUE: Contiguous axial images were obtained from the base of the skull through the vertex without intravenous contrast. RADIATION DOSE REDUCTION: This exam was performed according to the departmental dose-optimization program which includes automated exposure control, adjustment of the mA and/or kV according to patient size and/or use of iterative reconstruction technique. COMPARISON:  12/01/2021 FINDINGS: Brain: There is atrophy and chronic small vessel disease changes. No acute intracranial abnormality. Specifically, no hemorrhage, hydrocephalus, mass lesion, acute infarction, or significant intracranial injury. Vascular: No hyperdense vessel or unexpected calcification. Skull: No acute calvarial abnormality. Sinuses/Orbits: No acute findings. Mucosal thickening throughout the paranasal sinuses. Other: None IMPRESSION: Atrophy, chronic microvascular disease. No acute intracranial abnormality. Electronically Signed   By: 12/03/2021 M.D.   On: 02/09/2022 08:40   DG Chest Portable 1 View  Result Date: 02/20/2022 CLINICAL DATA:  Ventilator dependence. EXAM: PORTABLE CHEST 1 VIEW COMPARISON:  02/22/2022 FINDINGS: 0819 hours. Endotracheal tube tip is 3.5 cm above the base of the carina. The NG tube passes just into the stomach  with proximal side port in the lower esophagus. Left permanent pacemaker again noted. Low volume film with patchy airspace disease in the left mid lung and right base. Pulmonary vascular congestion evident. The cardio pericardial silhouette is enlarged. No substantial pleural effusion. Telemetry leads overlie the chest. IMPRESSION: 1. Patchy airspace disease left mid lung and right base. Asymmetric edema versus multifocal pneumonia. 2. NG tube tip is just below the GE junction with proximal side port in the distal esophagus. 3. Endotracheal tube tip 3.5 cm above the  base of the carina. Electronically Signed   By: Kennith Center M.D.   On: 02/16/2022 08:29   CUP PACEART REMOTE DEVICE CHECK  Result Date: 02/25/2022 Scheduled remote reviewed. Normal device function.  Known AF, Warfarin Next remote 91 days. LA  DG Knee Complete 4 Views Right  Result Date: 02/22/2022 CLINICAL DATA:  Right knee pain and swelling. EXAM: RIGHT KNEE - COMPLETE 4+ VIEW COMPARISON:  11/10/2021. FINDINGS: No fracture or bone lesion. Mild to moderate medial and mild lateral joint space compartment narrowing. Minor marginal osteophytes. Mild subchondral cystic change along the dorsal patella. Moderate joint effusion. Mild anterior soft tissue edema. IMPRESSION: 1. No fracture or acute skeletal abnormality. 2. Mild tricompartmental degenerative changes. 3. Moderate joint effusion and mild anterior soft tissue swelling. Electronically Signed   By: Amie Portland M.D.   On: 02/22/2022 17:29   DG Chest Port 1 View  Result Date: 02/22/2022 CLINICAL DATA:  Suspected sepsis. EXAM: PORTABLE CHEST 1 VIEW COMPARISON:  12/25/2021 and older exams. FINDINGS: Stable changes from cardiac valve replacement. Cardiac silhouette is mildly enlarged. Stable left anterior chest wall biventricular cardioverter defibrillator. No mediastinal or hilar masses. Clear lungs.  No convincing pleural effusion.  No pneumothorax. Skeletal structures are grossly intact. IMPRESSION: 1. No acute cardiopulmonary disease. Electronically Signed   By: Amie Portland M.D.   On: 02/22/2022 17:27    Microbiology Recent Results (from the past 240 hour(s))  Culture, blood (routine x 2)     Status: Abnormal   Collection Time: 03/01/2022  8:02 AM   Specimen: BLOOD  Result Value Ref Range Status   Specimen Description BLOOD SITE NOT SPECIFIED  Final   Special Requests   Final    BOTTLES DRAWN AEROBIC AND ANAEROBIC Blood Culture results may not be optimal due to an excessive volume of blood received in culture bottles   Culture  Setup Time    Final    GRAM POSITIVE COCCI IN CLUSTERS ANAEROBIC BOTTLE ONLY Organism ID to follow CRITICAL RESULT CALLED TO, READ BACK BY AND VERIFIED WITH: C/ PHARMD JAMES L. 03/04/22 0428 A. LAFRANCE    Culture (A)  Final    STAPHYLOCOCCUS CAPITIS THE SIGNIFICANCE OF ISOLATING THIS ORGANISM FROM A SINGLE SET OF BLOOD CULTURES WHEN MULTIPLE SETS ARE DRAWN IS UNCERTAIN. PLEASE NOTIFY THE MICROBIOLOGY DEPARTMENT WITHIN ONE WEEK IF SPECIATION AND SENSITIVITIES ARE REQUIRED. Performed at [facility], [address], [place], [place] [address]    Report Status 03/06/2022 FINAL  Final  Blood Culture ID Panel (Reflexed)     Status: Abnormal   Collection Time: 02/12/2022  8:02 AM  Result Value Ref Range Status   Enterococcus faecalis NOT DETECTED NOT DETECTED Final   Enterococcus Faecium NOT DETECTED NOT DETECTED Final   Listeria monocytogenes NOT DETECTED NOT DETECTED Final   Staphylococcus species DETECTED (A) NOT DETECTED Final    Comment: CRITICAL RESULT CALLED TO, READ BACK BY AND VERIFIED WITH: C/ PHARMD JAMES L. 03/04/22 5102  A. LAFRANCE    Staphylococcus aureus (BCID) NOT DETECTED NOT DETECTED Final   Staphylococcus epidermidis NOT DETECTED NOT DETECTED Final   Staphylococcus lugdunensis NOT DETECTED NOT DETECTED Final   Streptococcus species NOT DETECTED NOT DETECTED Final   Streptococcus agalactiae NOT DETECTED NOT DETECTED Final   Streptococcus pneumoniae NOT DETECTED NOT DETECTED Final   Streptococcus pyogenes NOT DETECTED NOT DETECTED Final   A.calcoaceticus-baumannii NOT DETECTED NOT DETECTED Final   Bacteroides fragilis NOT DETECTED NOT DETECTED Final   Enterobacterales NOT DETECTED NOT DETECTED Final   Enterobacter cloacae complex NOT DETECTED NOT DETECTED Final   Escherichia coli NOT DETECTED NOT DETECTED Final   Klebsiella aerogenes NOT DETECTED NOT DETECTED Final   Klebsiella oxytoca NOT DETECTED NOT DETECTED Final   Klebsiella pneumoniae NOT DETECTED NOT DETECTED Final    Proteus species NOT DETECTED NOT DETECTED Final   Salmonella species NOT DETECTED NOT DETECTED Final   Serratia marcescens NOT DETECTED NOT DETECTED Final   Haemophilus influenzae NOT DETECTED NOT DETECTED Final   Neisseria meningitidis NOT DETECTED NOT DETECTED Final   Pseudomonas aeruginosa NOT DETECTED NOT DETECTED Final   Stenotrophomonas maltophilia NOT DETECTED NOT DETECTED Final   Candida albicans NOT DETECTED NOT DETECTED Final   Candida auris NOT DETECTED NOT DETECTED Final   Candida glabrata NOT DETECTED NOT DETECTED Final   Candida krusei NOT DETECTED NOT DETECTED Final   Candida parapsilosis NOT DETECTED NOT DETECTED Final   Candida tropicalis NOT DETECTED NOT DETECTED Final   Cryptococcus neoformans/gattii NOT DETECTED NOT DETECTED Final    Comment: Performed at [facility], [address] [place], [place] [address]  Culture, blood (routine x 2)     Status: None   Collection Time: 03/04/2022  8:07 AM   Specimen: BLOOD RIGHT WRIST  Result Value Ref Range Status   Specimen Description BLOOD RIGHT WRIST  Final   Special Requests   Final    BOTTLES DRAWN AEROBIC AND ANAEROBIC Blood Culture adequate volume   Culture   Final    NO GROWTH 5 DAYS Performed at [facility], [address], [place], [place] [address]    Report Status 20-Mar-2022 FINAL  Final  MRSA Next Gen by PCR, Nasal     Status: None   Collection Time: 02/20/2022  8:18 PM   Specimen: Nasal Mucosa; Nasal Swab  Result Value Ref Range Status   MRSA by PCR Next Gen NOT DETECTED NOT DETECTED Final    Comment: (NOTE) The GeneXpert MRSA Assay (FDA approved for NASAL specimens only), is one component of a comprehensive MRSA colonization surveillance program. It is not intended to diagnose MRSA infection nor to guide or monitor treatment for MRSA infections. Test performance is not FDA approved in patients less than 49 years old. Performed at [facility], [address], [place],  [place] [address]     Lab Basic Metabolic Panel: Recent Labs  Lab 03/04/22 [phone] 03/04/22 1703 03/05/22 0544 03/05/22 1657 03/06/22 0244 03/07/22 0604 03-20-22 0500  NA 139  --  140  --  139 141 136  K 3.5  --  3.8  --  3.8 3.9 4.4  CL 101  --  107  --  108 112* 104  CO2 21*  --  23  --  GLUCOSE 109*  --  95  --  119* 113* 173*  BUN 18  --  19  --  21 22 27*  CREATININE 1.97*  --  1.87*  --  1.58* 1.35* 1.38*  CALCIUM 8.7*  --  8.4*  --  7.9* 8.1* 7.7*  MG 2.1   < > 2.1 2.0 1.8 2.1 1.8  PHOS 3.7   < > 3.0 2.5 2.5 3.3 3.7   < > = values in this interval not displayed.   Liver Function Tests: Recent Labs  Lab 03/04/22 0534 03/05/22 0544 03/06/22 0244 03/07/22 0604 03/22/2022 0500  AST 60* 47* 38 33 34  ALT 41 32 ALKPHOS 62 61 59 61 61  BILITOT 1.7* 1.2 1.3* 1.0 1.2  PROT 6.2* 5.9* 5.3* 5.0* 5.2*  ALBUMIN 3.0* 2.6* 2.3* 2.2* 2.3*   No results for input(s): "LIPASE", "AMYLASE" in the last 168 hours. No results for input(s): "AMMONIA" in the last 168 hours. CBC: Recent Labs  Lab 03/04/22 0534 03/05/22 0544 03/06/22 0244 03/07/22 0604 03/23/2022 0500  WBC 16.6* 9.5 11.7* 8.6 10.8*  HGB 12.3* 11.9* 10.1* 9.8* 9.8*  HCT 41.0 38.8* 33.9* 31.4* 31.1*  MCV 89.3 88.2 91.1 87.2 91.2  PLT 530* 512* 447* 409* 453*   Cardiac Enzymes: No results for input(s): "CKTOTAL", "CKMB", "CKMBINDEX", "TROPONINI" in the last 168 hours. Sepsis Labs: Recent Labs  Lab 03/05/22 0544 03/06/22 0244 03/07/22 0604 03/07/2022 0500  WBC 9.5 11.7* 8.6 10.8*    Procedures/Operations  Endotracheal tube EEG   Heber Garden 03/10/2022, 3:55 PM

## 2022-04-05 NOTE — Progress Notes (Signed)
Subjective: In a good burst-suppression pattern overnight.   Exam: Vitals:   03/10/2022 0700 03/24/2022 0811  BP: 115/64   Pulse: (!) 53   Resp: 20   Temp:  97.8 F (36.6 C)  SpO2: 98%    Gen: In bed, intubated  Neuro: MS: Does not open eyes or follow commands CN: Pupils are small but reactive bilaterally, corneals are intact Motor: no movement to nox stim.  Sensory: As above   Impression: 76 year old male with post anoxic status epilepticus.  At this point, he would be considered super refractory status epilepticus given lack of improvement after for suppression.  Super refractory status epilepticus in the setting carries a very poor prognosis.  If he begins seizing again then I think that he will be very unlikely to make a significant recovery.  I have discussed with the family that I felt like his injury was quite severe given the recurrence of seizures but would try one more overnight burst-suppression.  Will discuss with them again today.   Recommendations: 1) Keppra 750 twice daily(renally dosed) 2) wean propofol 3) Depacon 750 3 times daily 5) neurology will continue to follow  This patient is critically ill and at significant risk of neurological worsening, death and care requires constant monitoring of vital signs, hemodynamics,respiratory and cardiac monitoring, neurological assessment, discussion with family, other specialists and medical decision making of high complexity. I spent 35 minutes of neurocritical care time  in the care of  this patient. This was time spent independent of any time provided by nurse practitioner or PA.  Ritta Slot, MD [facility] [phone]  If 7pm- 7am, please page neurology on call as listed in AMION. 03/30/2022  8:42 AM

## 2022-04-05 NOTE — Progress Notes (Signed)
Advanced Heart Failure Rounding Note  PCP-Cardiologist: None   Subjective:    Remains intubated/sedated. Unresponsive.   Had recurrent seizure activity when meds weaned yesterday. Now weaning again  Got IV lasix yesterday with over 4L out  Objective:   Weight Range: 84.5 kg Body mass index is 28.33 kg/m.   Vital Signs:   Temp:  [82 F (27.8 C)-102.1 F (38.9 C)] 97.8 F (36.6 C) (09/03 0811) Pulse Rate:  [53-76] 59 (09/03 0730) Resp:  [13-25] 20 (09/03 0730) BP: (85-120)/(50-73) 105/63 (09/03 0730) SpO2:  [96 %-100 %] 98 % (09/03 0730) FiO2 (%):  [30 %-40 %] 30 % (09/03 0730) Weight:  [84.5 kg] 84.5 kg (09/03 0430) Last BM Date : 03/07/22  Weight change: Filed Weights   03/06/22 0500 03/07/22 0500 03/10/2022 0430  Weight: 86.1 kg 83.7 kg 84.5 kg    Intake/Output:   Intake/Output Summary (Last 24 hours) at 03/10/22 1053 Last data filed at 03-10-22 1000 Gross per 24 hour  Intake 3649.63 ml  Output 4440 ml  Net -790.37 ml       Physical Exam    General:  Intubated/sedated HEENT: normal Neck: supple. JVP 7-8 Carotids 2+ bilat; no bruits. No lymphadenopathy or thryomegaly appreciated. Cor: PMI nondisplaced. irregular rate & rhythm. Mech s1 Lungs: clear Abdomen: soft, nontender, nondistended. No hepatosplenomegaly. No bruits or masses. Good bowel sounds. Extremities: no cyanosis, clubbing, rash, 1+ edema Neuro: intubated/sedated unresponsive   Labs    CBC Recent Labs    03/07/22 0604 03/10/2022 0500  WBC 8.6 10.8*  HGB 9.8* 9.8*  HCT 31.4* 31.1*  MCV 87.2 91.2  PLT 409* 453*    Basic Metabolic Panel Recent Labs    43/32/95 0604 03/10/22 0500  NA 141 136  K 3.9 4.4  CL 112* 104  CO2 23 22  GLUCOSE 113* 173*  BUN 22 27*  CREATININE 1.35* 1.38*  CALCIUM 8.1* 7.7*  MG 2.1 1.8  PHOS 3.3 3.7    Liver Function Tests Recent Labs    03/07/22 0604 2022-03-10 0500  AST 33 34  ALT 21 18  ALKPHOS 61 61  BILITOT 1.0 1.2  PROT 5.0* 5.2*   ALBUMIN 2.2* 2.3*    No results for input(s): "LIPASE", "AMYLASE" in the last 72 hours. Cardiac Enzymes No results for input(s): "CKTOTAL", "CKMB", "CKMBINDEX", "TROPONINI" in the last 72 hours.  BNP: BNP (last 3 results) Recent Labs    12/19/21 1129 12/25/21 1401 02/22/22 1814  BNP 238.8* 366.8* 416.4*     ProBNP (last 3 results) No results for input(s): "PROBNP" in the last 8760 hours.   D-Dimer No results for input(s): "DDIMER" in the last 72 hours. Hemoglobin A1C No results for input(s): "HGBA1C" in the last 72 hours. Fasting Lipid Panel Recent Labs    10-Mar-2022 0500  TRIG 1,099*    Thyroid Function Tests No results for input(s): "TSH", "T4TOTAL", "T3FREE", "THYROIDAB" in the last 72 hours.  Invalid input(s): "FREET3"  Other results:   Imaging    DG CHEST PORT 1 VIEW  Result Date: Mar 10, 2022 CLINICAL DATA:  Respiratory failure. EXAM: PORTABLE CHEST 1 VIEW COMPARISON:  03/07/2022 FINDINGS: 0620 hours. The cardio pericardial silhouette is enlarged. Bibasilar collapse/consolidation again noted, decreased on the left and similar on the right. Stable appearance small right pleural effusion. Left permanent pacemaker again noted. A tracheal tube tip is 4.1 cm above the base of the carina. The NG tube passes into the stomach although the distal tip position is not included  on the film. Right PICC line tip overlies the SVC/RA junction. Telemetry leads overlie the chest. IMPRESSION: Interval improvement in aeration at the left base. Otherwise no substantial change. Electronically Signed   By: Kennith Center M.D.   On: 18-Mar-2022 08:06   DG CHEST PORT 1 VIEW  Result Date: 03/07/2022 CLINICAL DATA:  PICC placement. EXAM: PORTABLE CHEST 1 VIEW COMPARISON:  03/05/2022 and older studies. FINDINGS: New right-sided PICC.  Tip projects in the lower superior vena cava. Endotracheal tube has been retracted since the prior study, tip now projecting 3 cm above the carina. Stable  nasal/orogastric tube and left anterior chest wall biventricular cardioverter-defibrillator. Cardiac silhouette mildly enlarged. Bilateral lung base opacities obscuring the right and partly obscuring the left hemidiaphragms consistent with a combination of pleural fluid and atelectasis. Pneumonia not excluded. Upper lungs are clear. No pneumothorax. IMPRESSION: 1. Well-positioned new right PICC. 2. Endotracheal tube has been retracted, now well positioned 3 cm above the carina. 3. Persistent lung base opacities consistent with a combination of pleural effusions and atelectasis. Consider a component of pneumonia if there are consistent clinical findings. Electronically Signed   By: Amie Portland M.D.   On: 03/07/2022 15:57   [place] EKG SITE RITE  Result Date: 03/07/2022 If Site Rite image not attached, placement could not be confirmed due to current cardiac rhythm.    Medications:     Scheduled Medications:  Chlorhexidine Gluconate Cloth  6 each Topical Q0600   docusate  100 mg Per Tube BID   mouth rinse  15 mL Mouth Rinse Q2H   pantoprazole  40 mg Per Tube Daily   polyethylene glycol  17 g Per Tube Daily   sodium chloride flush  10-40 mL Intracatheter Q12H    Infusions:  sodium chloride Stopped (2022/03/18 0452)   feeding supplement (VITAL AF 1.2 CAL) 1,000 mL (2022-03-18 1017)   heparin 1,600 Units/hr (03/18/2022 0900)   lacosamide (VIMPAT) IV 150 mg (03-18-2022 1028)   levETIRAcetam Stopped (03/18/2022 0031)   norepinephrine (LEVOPHED) Adult infusion 5 mcg/min (Mar 18, 2022 0900)   propofol (DIPRIVAN) infusion 40 mcg/kg/min (Mar 18, 2022 0953)   valproate sodium Stopped (03/18/22 0643)    PRN Medications: acetaminophen **OR** acetaminophen (TYLENOL) oral liquid 160 mg/5 mL **OR** acetaminophen, fentaNYL (SUBLIMAZE) injection, fentaNYL (SUBLIMAZE) injection, LORazepam, ondansetron (ZOFRAN) IV, mouth rinse, sodium chloride flush   Assessment/Plan   VF arrest: - in setting of severe electrolyte  disturbances. Downtime 36 minutes per PM - HS troponin up to 2800 after multiple rounds of CPR. No CAD on last LHC in 2009 - Echo 1/21 EF 35-40% RV ok. mechanical MVR ok - Echo 08/23 EF 25-30%, inferior and inferolateral WMA, RV moderately reduced, severe BAE, mechanical MVR okay - s/p CRT-P in past  - No further VF - Keep K > 4.0 Mg > 2.0 - Concern for anoxic brain injury (see below)   2. Chronic systolic CHF: - NICM dating back to 2009 - Echo 1/21 EF 35-40% RV ok. mechanical MVR ok - Echo 04/23: EF 35-40%, RV moderately reduced, mechanical MV okay - Echo 08/23: EF 25-30%, inferior and inferolateral WMA, RV moderately reduced, severe BAE, mechanical MVR okay - On NE 5 for BP support.  - Volume status remains elevated. Got lasix 40 IV with good output   3. CKD IIIb: - Scr post arrest 1.57>1.74>1.97>1.87>1.58 > 1.35 > 1.38 - Baseline Scr appears 1.5-1.8   4. Hypokalemia/hypomagnesemia: - Keep K > 4.0 Mg > 2.0  5. Anoxic brain injury: - suspect severe -  EEG with periods of atypical status epilepticus - CT x 2 no acute process - Has recurrent seizure activity every time high-dose sedation weaned.  6. S/p Mechanical MVR - MV okay on echo this admit - supratherapeutic INR on admit, start heparin when INR < 2.5 - INR 2.6  on heparin as well (INR was 2.4 yesterday)   7. AF, chronic - Rate controlled. - INR 2.4. Heparin as above  He appears to have sever anoxic brain injury and unable to tolerate sedation wean. Spoke with family and explained that we have likely now reached the point where it is clear that he will not recover and should switch to comfort care. Await final recs from Neuro.   CRITICAL CARE Performed by: [name]  Total critical care time: 40 minutes  Critical care time was exclusive of separately billable procedures and treating other patients.  Critical care was necessary to treat or prevent imminent or life-threatening deterioration.  Critical care  was time spent personally by me (independent of midlevel providers or residents) on the following activities: development of treatment plan with patient and/or surrogate as well as nursing, discussions with consultants, evaluation of patient's response to treatment, examination of patient, obtaining history from patient or surrogate, ordering and performing treatments and interventions, ordering and review of laboratory studies, ordering and review of radiographic studies, pulse oximetry and re-evaluation of patient's condition.  Length of Stay: 5  [name], MD  03/18/2022, 10:53 AM  Advanced Heart Failure Team Pager [phone] (M-F; 7a - 5p)  Please contact [facility] for night-coverage after hours (5p -7a ) and weekends on [email or web address]

## 2022-04-05 NOTE — Procedures (Signed)
Extubation Procedure Note  Patient Details:   Name: DAAIEL STARLIN DOB: 1945-08-01 MRN: [id number]   Airway Documentation:  Airway 7 mm (Active)  Secured at (cm) 23 cm 06-Apr-2022 1520  Measured From Lips 2022/04/06 1520  Secured Location Right 04/06/22 1520  Secured By [employer] Apr 06, 2022 1520  Tube Holder Repositioned Yes 04/06/22 1520  Prone position No 04-06-22 1520  Head position Right 03/07/22 1600  Cuff Pressure (cm H2O) Green OR 18-26 CmH2O April 06, 2022 0730  Site Condition Dry 2022-04-06 1520   Vent end date: (not recorded) Vent end time: (not recorded)   Evaluation  O2 sats: stable throughout Complications: No apparent complications Patient did not tolerate procedure well. Bilateral Breath Sounds: Diminished   No  Bettey Mare Kamaree Berkel 2022-04-06, 7:54 PM   PT made comfort care. Extubated to RA. Family and RN at bedside.

## 2022-04-05 NOTE — Progress Notes (Signed)
LTM EEG discontinued - no skin breakdown at unhook.   

## 2022-04-05 NOTE — Progress Notes (Signed)
Attending:    Subjective: PICC placed attempted to d/c burst suppression with propofol> started having facial twitching again Have discussed with neurology and cardiology>   Objective: Vitals:   March 30, 2022 0700 2022-03-30 0730 30-Mar-2022 0811 30-Mar-2022 0900  BP: 115/64 105/63    Pulse: (!) 53 (!) 59    Resp: 20 20    Temp:   97.8 F (36.6 C)   TempSrc:   Axillary Esophageal  SpO2: 98% 98%    Weight:      Height:       Vent Mode: PRVC FiO2 (%):  [30 %-40 %] 30 % Set Rate:  [20 bmp] 20 bmp Vt Set:  [550 mL] 550 mL PEEP:  [5 cmH20] 5 cmH20 Plateau Pressure:  [17 cmH20-18 cmH20] 17 cmH20  Intake/Output Summary (Last 24 hours) at 30-Mar-2022 1140 Last data filed at 03-30-2022 1000 Gross per 24 hour  Intake 3548.22 ml  Output 4315 ml  Net -766.78 ml    General:  In bed on vent HENT: NCAT ETT in place PULM: CTA B, vent supported breathing CV: RRR, no mgr GI: BS+, soft, nontender MSK: normal bulk and tone Neuro: sedated on vent    CBC    Component Value Date/Time   WBC 10.8 (H) Mar 30, 2022 0500   RBC 3.41 (L) 03/30/22 0500   HGB 9.8 (L) 03/30/2022 0500   HGB 9.2 (L) 11/03/2021 1104   HCT 31.1 (L) 2022-03-30 0500   HCT 32.6 (L) 11/03/2021 1104   PLT 453 (H) Mar 30, 2022 0500   PLT 476 (H) 11/03/2021 1104   MCV 91.2 03-30-22 0500   MCV 73 (L) 11/03/2021 1104   MCH 28.7 30-Mar-2022 0500   MCHC 31.5 2022-03-30 0500   RDW 21.1 (H) 2022-03-30 0500   RDW 18.2 (H) 11/03/2021 1104   LYMPHSABS 2.6 02/25/2022 0805   LYMPHSABS 1.6 01/04/2020 1046   MONOABS 0.2 02/12/2022 0805   EOSABS 0.3 02/24/2022 0805   EOSABS 0.1 01/04/2020 1046   BASOSABS 0.0 03/05/2022 0805   BASOSABS 0.1 01/04/2020 1046    BMET    Component Value Date/Time   NA 136 2022-03-30 0500   NA 135 12/29/2021 1037   K 4.4 03/30/2022 0500   CL 104 March 30, 2022 0500   CO2 22 2022-03-30 0500   GLUCOSE 173 (H) 30-Mar-2022 0500   BUN 27 (H) 2022/03/30 0500   BUN 38 (H) 12/29/2021 1037   CREATININE 1.38 (H)  2022-03-30 0500   CREATININE 1.37 (H) 12/21/2014 1612   CALCIUM 7.7 (L) Mar 30, 2022 0500   CALCIUM 8.9 06/26/2008 2248   GFRNONAA 52 (L) 03-30-22 0500   GFRAA 41 (L) 01/15/2020 1131      Impression Anoxic brain injury, severe Acute respiratory failure with hypoxemia Out of hospital VF arrest Status epilepticus CKD  Discussion: Continued status epilepticus portends a dismal prognosis, he will not wake up, will not make neurologic progress.  I met with family last night and again this morning and they are willing to proceed with comfort measures.   Plan: Code status DNR Comfort measures later today after family has visit Continue full mechanical ventilator support and vasopressors for now, but plan to stop Will use fentanyl infusion for comfort May need to use propofol infusion for comfort if he has significant convulsive activity  My cc time 30 minutes meeting with family, nursing, cardiology and neurology regarding end of life care.   Roselie Awkward, MD [facility] Pager: [phone] Cell: [phone] After 7pm: [phone]

## 2022-04-05 NOTE — Progress Notes (Addendum)
1660 - Dr. Amada Jupiter instructed this RN to turn propofol down from 80 to 50, and down 10 each hour after.  0930 - Dr. Gala Romney at bedside with patient's  daughter [facility]) and family member.  1043 - Spoke to Dr. Amada Jupiter regarding bilateral eyelid twitching with propofol at 30.  Instructed to turn propofol to 40 for family comfort.

## 2022-04-05 NOTE — Progress Notes (Signed)
ANTICOAGULATION CONSULT NOTE   Pharmacy Consult for heparin  Indication:  mechanical MVR  and atrial fibrillation  No Known Allergies  Patient Measurements: Height: 5\' 8"  (172.7 cm) Weight: 84.5 kg (186 lb 4.6 oz) IBW/kg (Calculated) : 68.4 IBW: 68.4 kg   Vital Signs: Temp: 98.8 F (37.1 C) (09/03 0500) Temp Source: Oral (09/03 0500) BP: 99/60 (09/03 0600) Pulse Rate: 56 (09/03 0600)  Labs: Recent Labs    03/06/22 0244 03/07/22 0604 03/07/22 1636 March 20, 2022 0500  HGB 10.1* 9.8*  --  9.8*  HCT 33.9* 31.4*  --  31.1*  PLT 447* 409*  --  453*  LABPROT 28.9* 26.3*  --  27.3*  INR 2.8* 2.4*  --  2.6*  HEPARINUNFRC  --   --  <0.10* 0.28*  CREATININE 1.58* 1.35*  --  1.38*    Estimated Creatinine Clearance: 46.7 mL/min (A) (by C-G formula based on SCr of 1.38 mg/dL (H)).  Medical History: Past Medical History:  Diagnosis Date   Acute GI bleeding 06/27/2021   Anemia 01/29/2016   Atrial fibrillation (HCC)    post op.  s/p dc-cv 04/2008. 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  a- Echo 07/2008 Ef 55% mild peroprosthetic MVR with High transmitral gradient (mean 14).  b- normal coronaries by cath 12/2007   BPH (benign prostatic hyperplasia)    Cardiac resynchronization therapy pacemaker (CRT-P) in place 06/22/2011   CHF (congestive heart failure) (HCC)    EF 35-40 % 2011 due to valvular disease and diastolic dysfunction   Chronic back pain    CKD (chronic kidney disease), stage III (HCC)    COVID-19 virus infection 07/24/2020   Tested positive on July 16, 2020   Dysphagia    with normal barium swallow 09/2008   Endocarditis    GERD (gastroesophageal reflux disease)    Heavy alcohol use    history   History of atrial flutter 03/13/2008   History of atrial flutter - resolved.    History of GI bleed    Hypertension    IV  drug abuse (HCC)    history of   Lower GI bleed 01/2016   Memory impairment 12/24/2015   Pacemaker 2010   Medications:  Infusions:   sodium chloride 10 mL/hr at 03/20/22 0200   feeding supplement (VITAL AF 1.2 CAL) 65 mL/hr at 2022-03-20 0200   heparin 1,450 Units/hr (03-20-22 0411)   lacosamide (VIMPAT) IV Stopped (2022/03/20 0150)   levETIRAcetam Stopped (03-20-22 0031)   norepinephrine (LEVOPHED) Adult infusion 6 mcg/min (03/20/2022 05/08/22)   propofol (DIPRIVAN) infusion 80 mcg/kg/min (Mar 20, 2022 05/08/22)   valproate sodium 750 mg (03/20/2022 0543)   Assessment: ES is a 76 year old male admitted for witnessed VF cardiac arrest requiring defibrillation x7. Patient was on warfarin prior to admission. Prior to admission warfarin dose was 5 mg Saturday and Sunday, 2.5 mg Monday, Tuesday, Wednesday, Thursday, Friday. Last warfarin dose unknown. Pharmacy consulted to start heparin when INR <2.5 for mechanical MVR and Afib.   Heparin level below goal: 0.28 on 1450 units/hr, no infusion issues or overt s/sx of bleeding per RN.  Goal of Therapy:  Heparin level 0.3-0.7 units/ml Monitor platelets by anticoagulation protocol: Yes   Plan:  -Increase heparin to 1600 units/hr -Heparin level in 8 hours and daily wth CBC daily  Saturday  Delmar Dondero, PharmD Clinical Pharmacist 03/07/2022 6:53 AM Please check AMION for all [facility] numbers

## 2022-04-05 NOTE — Progress Notes (Addendum)
Chaplain met with daughter and family member of patient at bedside this early evening.  Prayer and comfort were given to them all.  Anointed with oil and recited The Lords Prayer.  Provided spiritual care for their Anticipatory Grief.    Respectfully  submitted,  Burnetta Sabin Baldomero Lamy

## 2022-04-05 NOTE — Progress Notes (Signed)
Pt TOD 2005 pronounced by 2 RNs. Emotional support provided to family at bedside. No belongings present at bedside to return to family.

## 2022-04-05 NOTE — Progress Notes (Signed)
Pt's daughter Maudie Mercury present at bedside, verbalizes readiness to transition to comfort care. Emotional support provided, previous orders for comfort care initiated at this time.

## 2022-04-05 DEATH — deceased
